# Patient Record
Sex: Female | Born: 1955 | Race: White | Hispanic: No | Marital: Married | Smoking: Never smoker
Health system: Southern US, Community
[De-identification: ages and names within clinical notes are randomized; demographics above are authoritative.]

## PROBLEM LIST (undated history)

## (undated) DIAGNOSIS — R4701 Aphasia: Secondary | ICD-10-CM

## (undated) DIAGNOSIS — F329 Major depressive disorder, single episode, unspecified: Secondary | ICD-10-CM

## (undated) DIAGNOSIS — C801 Malignant (primary) neoplasm, unspecified: Secondary | ICD-10-CM

## (undated) DIAGNOSIS — B029 Zoster without complications: Secondary | ICD-10-CM

## (undated) DIAGNOSIS — E079 Disorder of thyroid, unspecified: Secondary | ICD-10-CM

## (undated) DIAGNOSIS — R55 Syncope and collapse: Secondary | ICD-10-CM

## (undated) DIAGNOSIS — IMO0002 Reserved for concepts with insufficient information to code with codable children: Secondary | ICD-10-CM

## (undated) DIAGNOSIS — E860 Dehydration: Secondary | ICD-10-CM

## (undated) DIAGNOSIS — F419 Anxiety disorder, unspecified: Secondary | ICD-10-CM

## (undated) DIAGNOSIS — F32A Depression, unspecified: Secondary | ICD-10-CM

## (undated) DIAGNOSIS — Q231 Congenital insufficiency of aortic valve: Secondary | ICD-10-CM

## (undated) DIAGNOSIS — Z9221 Personal history of antineoplastic chemotherapy: Secondary | ICD-10-CM

## (undated) DIAGNOSIS — K219 Gastro-esophageal reflux disease without esophagitis: Secondary | ICD-10-CM

## (undated) DIAGNOSIS — R200 Anesthesia of skin: Secondary | ICD-10-CM

## (undated) DIAGNOSIS — I719 Aortic aneurysm of unspecified site, without rupture: Secondary | ICD-10-CM

## (undated) DIAGNOSIS — I441 Atrioventricular block, second degree: Secondary | ICD-10-CM

## (undated) DIAGNOSIS — Z950 Presence of cardiac pacemaker: Secondary | ICD-10-CM

## (undated) DIAGNOSIS — I82402 Acute embolism and thrombosis of unspecified deep veins of left lower extremity: Secondary | ICD-10-CM

## (undated) DIAGNOSIS — Z923 Personal history of irradiation: Secondary | ICD-10-CM

## (undated) DIAGNOSIS — I959 Hypotension, unspecified: Secondary | ICD-10-CM

## (undated) DIAGNOSIS — Q2381 Bicuspid aortic valve: Secondary | ICD-10-CM

## (undated) HISTORY — DX: Aphasia: R47.01

## (undated) HISTORY — DX: Syncope and collapse: R55

## (undated) HISTORY — DX: Anesthesia of skin: R20.0

## (undated) HISTORY — DX: Major depressive disorder, single episode, unspecified: F32.9

## (undated) HISTORY — PX: INSERT / REPLACE / REMOVE PACEMAKER: SUR710

## (undated) HISTORY — DX: Dehydration: E86.0

## (undated) HISTORY — PX: ABDOMINAL WALL MESH  REMOVAL: SHX1116

## (undated) HISTORY — DX: Disorder of thyroid, unspecified: E07.9

## (undated) HISTORY — PX: TONSILLECTOMY: SHX5217

## (undated) HISTORY — DX: Hypotension, unspecified: I95.9

## (undated) HISTORY — DX: Malignant (primary) neoplasm, unspecified: C80.1

## (undated) HISTORY — DX: Atrioventricular block, second degree: I44.1

## (undated) HISTORY — DX: Anxiety disorder, unspecified: F41.9

## (undated) HISTORY — DX: Depression, unspecified: F32.A

## (undated) HISTORY — PX: EYE SURGERY: SHX253

## (undated) HISTORY — PX: TONSILLECTOMY: SUR1361

## (undated) HISTORY — PX: DILATION AND CURETTAGE OF UTERUS: SHX78

## (undated) HISTORY — DX: Zoster without complications: B02.9

## (undated) HISTORY — PX: OTHER SURGICAL HISTORY: SHX169

## (undated) HISTORY — DX: Aortic aneurysm of unspecified site, without rupture: I71.9

---
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
## 1998-07-12 HISTORY — PX: CHOLECYSTECTOMY: SHX55

## 2002-07-12 HISTORY — PX: OTHER SURGICAL HISTORY: SHX169

## 2004-07-12 HISTORY — PX: PACEMAKER INSERTION: SHX728

## 2005-02-18 ENCOUNTER — Encounter: Payer: Self-pay | Admitting: Family Medicine

## 2005-03-12 ENCOUNTER — Encounter: Payer: Self-pay | Admitting: Family Medicine

## 2005-04-21 ENCOUNTER — Encounter: Payer: Self-pay | Admitting: Cardiovascular Disease

## 2005-05-21 ENCOUNTER — Ambulatory Visit: Payer: Self-pay

## 2005-05-29 ENCOUNTER — Emergency Department (HOSPITAL_COMMUNITY): Admission: EM | Admit: 2005-05-29 | Discharge: 2005-05-30 | Payer: Self-pay | Admitting: Emergency Medicine

## 2005-06-08 ENCOUNTER — Inpatient Hospital Stay (HOSPITAL_COMMUNITY): Admission: RE | Admit: 2005-06-08 | Discharge: 2005-06-09 | Payer: Self-pay | Admitting: *Deleted

## 2006-06-16 ENCOUNTER — Ambulatory Visit: Payer: Self-pay | Admitting: Family Medicine

## 2006-07-14 ENCOUNTER — Ambulatory Visit: Payer: Self-pay | Admitting: Gastroenterology

## 2007-02-17 ENCOUNTER — Ambulatory Visit: Payer: Self-pay | Admitting: Unknown Physician Specialty

## 2007-02-17 ENCOUNTER — Other Ambulatory Visit: Payer: Self-pay

## 2007-02-23 ENCOUNTER — Ambulatory Visit: Payer: Self-pay | Admitting: Unknown Physician Specialty

## 2007-09-18 ENCOUNTER — Ambulatory Visit: Payer: Self-pay | Admitting: Family Medicine

## 2008-10-26 ENCOUNTER — Emergency Department: Payer: Self-pay | Admitting: Emergency Medicine

## 2008-10-30 ENCOUNTER — Encounter: Payer: Self-pay | Admitting: Cardiovascular Disease

## 2008-11-25 ENCOUNTER — Ambulatory Visit: Payer: Self-pay | Admitting: Nephrology

## 2009-04-07 ENCOUNTER — Encounter: Payer: Self-pay | Admitting: Cardiovascular Disease

## 2009-08-20 DIAGNOSIS — R0602 Shortness of breath: Secondary | ICD-10-CM | POA: Insufficient documentation

## 2009-08-20 DIAGNOSIS — Z950 Presence of cardiac pacemaker: Secondary | ICD-10-CM | POA: Insufficient documentation

## 2009-08-20 DIAGNOSIS — R55 Syncope and collapse: Secondary | ICD-10-CM | POA: Insufficient documentation

## 2009-08-28 ENCOUNTER — Encounter: Payer: Self-pay | Admitting: Cardiovascular Disease

## 2009-09-03 ENCOUNTER — Encounter: Payer: Self-pay | Admitting: Cardiovascular Disease

## 2009-09-15 ENCOUNTER — Ambulatory Visit: Payer: Self-pay | Admitting: Internal Medicine

## 2009-09-15 DIAGNOSIS — R9431 Abnormal electrocardiogram [ECG] [EKG]: Secondary | ICD-10-CM | POA: Insufficient documentation

## 2009-11-26 ENCOUNTER — Ambulatory Visit: Payer: Self-pay | Admitting: Family Medicine

## 2009-12-17 ENCOUNTER — Encounter: Payer: Self-pay | Admitting: Internal Medicine

## 2009-12-23 ENCOUNTER — Ambulatory Visit: Payer: Self-pay | Admitting: Internal Medicine

## 2009-12-23 ENCOUNTER — Telehealth (INDEPENDENT_AMBULATORY_CARE_PROVIDER_SITE_OTHER): Payer: Self-pay | Admitting: *Deleted

## 2010-01-20 ENCOUNTER — Encounter: Payer: Self-pay | Admitting: Internal Medicine

## 2010-02-24 ENCOUNTER — Encounter: Payer: Self-pay | Admitting: Internal Medicine

## 2010-02-24 ENCOUNTER — Ambulatory Visit: Payer: Self-pay

## 2010-03-04 ENCOUNTER — Encounter: Payer: Self-pay | Admitting: Cardiovascular Disease

## 2010-03-05 ENCOUNTER — Ambulatory Visit: Payer: Self-pay | Admitting: Cardiovascular Disease

## 2010-03-06 LAB — CONVERTED CEMR LAB
BUN: 19 mg/dL (ref 6–23)
CO2: 25 meq/L (ref 19–32)
Calcium: 9.3 mg/dL (ref 8.4–10.5)
Chloride: 107 meq/L (ref 96–112)
Creatinine, Ser: 0.77 mg/dL (ref 0.40–1.20)
Glucose, Bld: 90 mg/dL (ref 70–99)
Potassium: 4.1 meq/L (ref 3.5–5.3)
Sodium: 144 meq/L (ref 135–145)

## 2010-03-11 ENCOUNTER — Encounter: Payer: Self-pay | Admitting: Cardiovascular Disease

## 2010-03-11 ENCOUNTER — Ambulatory Visit: Payer: Self-pay | Admitting: Cardiovascular Disease

## 2010-03-18 ENCOUNTER — Telehealth: Payer: Self-pay | Admitting: Cardiovascular Disease

## 2010-03-27 ENCOUNTER — Ambulatory Visit: Payer: Self-pay | Admitting: Internal Medicine

## 2010-03-27 ENCOUNTER — Encounter: Payer: Self-pay | Admitting: Internal Medicine

## 2010-05-08 ENCOUNTER — Encounter (INDEPENDENT_AMBULATORY_CARE_PROVIDER_SITE_OTHER): Payer: Self-pay | Admitting: *Deleted

## 2010-06-25 ENCOUNTER — Ambulatory Visit: Payer: Self-pay | Admitting: Internal Medicine

## 2010-07-17 ENCOUNTER — Telehealth (INDEPENDENT_AMBULATORY_CARE_PROVIDER_SITE_OTHER): Payer: Self-pay | Admitting: *Deleted

## 2010-07-22 ENCOUNTER — Encounter (INDEPENDENT_AMBULATORY_CARE_PROVIDER_SITE_OTHER): Payer: Self-pay | Admitting: *Deleted

## 2010-08-11 NOTE — Miscellaneous (Signed)
Summary: dx correction  Clinical Lists Changes  Problems: Changed problem from PACEMAKER DDD MDT (ICD-V45.Marland Kitchen01) to PACEMAKER, PERMANENT (ICD-V45.01)  changed the incorrect dx code to correct dx code

## 2010-08-11 NOTE — Miscellaneous (Signed)
Summary: Device preload  Clinical Lists Changes  Observations: Added new observation of PPM INDICATN: Syncope (09/15/2009 8:17) Added new observation of MAGNET RTE: BOL 85 ERI  65 (09/15/2009 8:17) Added new observation of PPMLEADSTAT2: active (09/15/2009 8:17) Added new observation of PPMLEADSER2: [id number] (09/15/2009 8:17) Added new observation of PPMLEADMOD2: 4092  (09/15/2009 8:17) Added new observation of PPMLEADDOI2: 06/08/2005  (09/15/2009 8:17) Added new observation of PPMLEADLOC2: RV  (09/15/2009 8:17) Added new observation of PPMLEADSTAT1: active  (09/15/2009 8:17) Added new observation of PPMLEADSER1: [id number]  (09/15/2009 8:17) Added new observation of PPMLEADMOD1: 4592  (09/15/2009 8:17) Added new observation of PPMLEADDOI1: 06/08/2005  (09/15/2009 8:17) Added new observation of PPMLEADLOC1: RA  (09/15/2009 8:17) Added new observation of PPM IMP MD: NOT IMPLANTED BY [place]  (09/15/2009 8:17) Added new observation of PPM DOI: 06/08/2005  (09/15/2009 8:17) Added new observation of PPM SERL#: [id number]  (09/15/2009 8:17) Added new observation of PPM MODL#: ADDRO1  (09/15/2009 0:86) Added new observation of PACEMAKERMFG: Medtronic  (09/15/2009 8:17) Added new observation of PACEMAKER MD: Sherryl Manges, MD  (09/15/2009 8:17)      PPM Specifications Following MD:  Sherryl Manges, MD     PPM Vendor:  Medtronic     PPM Model Number:  ADDRO1     PPM Serial Number:  [id number] PPM DOI:  06/08/2005     PPM Implanting MD:  NOT IMPLANTED BY [place]  Lead 1    Location: RA     DOI: 06/08/2005     Model #: [id number]     Serial #: [id number]     Status: active Lead 2    Location: RV     DOI: 06/08/2005     Model #: [id number]     Serial #: [id number]     Status: active  Magnet Response Rate:  BOL 85 ERI  65  Indications:  Syncope

## 2010-08-11 NOTE — Letter (Signed)
Summary: Medical Record Release  Medical Record Release   Imported By: Harlon Flor 08/28/2009 15:30:15  _____________________________________________________________________  External Attachment:    Type:   Image     Comment:   External Document

## 2010-08-11 NOTE — Letter (Signed)
Summary: Remote Device Check  [facility], Main Office  [address]   [place], [place] [address]   Phone: [phone]  Fax: [phone]     January 20, 2010 MRN: [id number]   Wendy Kelly [address] [place], [place]  [address]   Dear Ms. Terrien,   Your remote transmission was recieved and reviewed by your physician.  All diagnostics were within normal limits for you.   __X____Your next office visit is scheduled for:   September 2011 with Dr Graciela Husbands. Please call our office to schedule an appointment.    Sincerely,  Vella Kohler

## 2010-08-11 NOTE — Cardiovascular Report (Signed)
Summary: Office Visit   Office Visit   Imported By: Roderic Ovens 04/06/2010 16:22:34  _____________________________________________________________________  External Attachment:    Type:   Image     Comment:   External Document

## 2010-08-11 NOTE — Progress Notes (Signed)
Summary: PACEMAKER  Phone Note Outgoing Call Call back at [phone]   Caller: Patient Call For: [facility] Summary of Call: PT TRANSMITTED PACEMAKER AND WANTS TO MAKE SURE THAT IT WENT THROUGH-WOULD LIKE A CALL BACK Initial call taken by: Harlon Flor,  December 23, 2009 9:48 AM Summary of Call: LMOVM for pt---transmission received.  Waiting for dr to look at.  Will send letter to pt for next fup. Vella Kohler  December 23, 2009 2:06 PM

## 2010-08-11 NOTE — Assessment & Plan Note (Signed)
Summary: NEP/AMD   Visit Type:  New EP Referring Provider:  Mariah Milling Primary Provider:  Vonita Moss  CC:  no complaints. .  History of Present Illness: Mrs. Ryerson is seen at the request of Dr. Mariah Milling to establish followup for pacemaker implanted for syncope with second degree heart block. She received a Medtronic adaptive device in 2006  Her history is interesting in that she had a lifelong history of recurrent syncope. She was told repeatedly that she had low blood pressure. Her pauses that were detected by monitoring occurred at night. Notwithstanding  this following the pacemaker insertion, she has had no recurrent syncope.  She has a history of normal left ventricular function by recent echo. There is also aortic root enlargement.  Her family history is noteworthy for her sister dying suddenly at the age of 32. She too had an antecedent history of recurrent syncope. An autopsy was not performed.   she denies symptoms of chest pain shortness of breath. There has been no palpitations.    Preventive Screening-Counseling & Management  Alcohol-Tobacco     Alcohol drinks/day: 0     Smoking Status: never  Caffeine-Diet-Exercise     Caffeine use/day: 1 cup     Does Patient Exercise: yes      Drug Use:  no.    Current Problems (verified): 1)  Dyspnea  (ICD-786.05) 2)  Syncope and Collapse  (ICD-780.2) 3)  Aaa  (ICD-441.4) 4)  Pacemaker  (ICD-V45.Marland Kitchen01)  Current Medications (verified): 1)  Estrace 0.1 Mg/gm Crea (Estradiol) .... As Directed 2)  Aspirin 81 Mg Tbec (Aspirin) .... Take One Tablet By Mouth Daily 3)  Daily Multi  Tabs (Multiple Vitamins-Minerals) .Marland Kitchen.. 1 By Mouth Once Daily 4)  Acai Berry 500 Mg Caps (Acai) .Marland Kitchen.. 1 By Mouth Once Daily 5)  Flonase 50 Mcg/act Susp (Fluticasone Propionate) .... As Directed  Allergies (verified): 1)  ! Pcn 2)  ! Sulfa 3)  ! * Omnicef 4)  ! * Aleve 5)  ! Jone Baseman  Past History:  Family History: Last updated:  09-27-09 Father: deceased 28: suicide: stroke @ 58 Mother: living 54 Sister: deceased 70: cardiac arrest maternal aunt status post aortic valve replacement  Social History: Last updated: 09/27/2009 Full Time Married  Tobacco Use - No.  Alcohol Use - no Regular Exercise - yes Drug Use - no  Past Medical History: second degree AV block hx of syncope ascending aortic aneurysm estimated 4.4cm. depression Fatigue  Past Surgical History: ppm-medronic 2006 cataract 2004 tonsilectomy bladder tac gall bladder 2000  Family History: Father: deceased 18: suicide: stroke @ 59 Mother: living 74 Sister: deceased 56: cardiac arrest maternal aunt status post aortic valve replacement  Social History: Full Time Married  Tobacco Use - No.  Alcohol Use - no Regular Exercise - yes Drug Use - no Alcohol drinks/day:  0 Smoking Status:  never Caffeine use/day:  1 cup Does Patient Exercise:  yes Drug Use:  no  Review of Systems       full review of systems was negative apart from a history of present illness and past medical history.   Vital Signs:  Patient profile:   55 year old female Height:      64 inches Weight:      169.50 pounds BMI:     29.20 Pulse rate:   70 / minute Pulse rhythm:   regular BP sitting:   102 / 68  (left arm) Cuff size:   regular  Vitals Entered By: Cala Bradford  Lutterloh (September 15, 2009 10:45 AM)  Physical Exam  General:  Alert and oriented middle-aged Caucasian female appearing her stated age in no acute distress. HEENT  normal . Neck veins were flat; carotids brisk and full without bruits. No lymphadenopathy. Back without kyphosis. Lungs clear. Heart sounds regular without murmurs or gallops. PMI nondisplaced. Abdomen soft with active bowel sounds without midline pulsation or hepatomegaly. Femoral pulses and distal pulses intact. Extremities were without clubbing cyanosis or edemaSkin warm and dry. Neurological exam grossly normal affect is  normal   EKG  Procedure date:  09/15/2009  Findings:      sinus rhythm at 70 Intervals 0.18/0.09/0.40 Axis is -42 ST Segment depression noted diffusely  PPM Specifications Following MD:  Sherryl Manges, MD     PPM Vendor:  Medtronic     PPM Model Number:  ADDRO1     PPM Serial Number:  [id number] PPM DOI:  06/08/2005     PPM Implanting MD:  NOT IMPLANTED BY [place]  Lead 1    Location: RA     DOI: 06/08/2005     Model #: 4592     Serial #: [id number]     Status: active Lead 2    Location: RV     DOI: 06/08/2005     Model #: 5784     Serial #: [id number]     Status: active  Magnet Response Rate:  BOL 85 ERI  65  Indications:  Syncope   PPM Follow Up Remote Check?  No Battery Voltage:  2.77 V     Battery Est. Longevity:  5.5 years     Pacer Dependent:  No       PPM Device Measurements Atrium  Amplitude: 4.0 mV, Impedance: 449 ohms, Threshold: 0.25 V at 0.4 msec Right Ventricle  Amplitude: 15.68 mV, Impedance: 637 ohms, Threshold: 0.5 V at 0.4 msec  Episodes MS Episodes:  94     Percent Mode Switch:  <0.1%     Ventricular High Rate:  4     Atrial Pacing:  3.8%     Ventricular Pacing:  0.9%  Parameters Mode:  DDD     Lower Rate Limit:  60     Upper Rate Limit:  130 Paced AV Delay:  170     Sensed AV Delay:  150 Next Remote Date:  12/17/2009     Next Cardiology Appt Due:  09/10/2010 Tech Comments:  No parameter changes.  94 mode switch episodes the longest 18:38minutes.  The longest of 4 VHR episodes lasted 9 seconds.  She is not on coumadin. 0.7% of her total heart rates were > 130bpm.  We enrolled her in carelink today and she will send her transmissions every 3 months and return to the [place] office in one year to see Dr. Graciela Husbands. Altha Harm, LPN  September 16, 1322 10:50 AM   Impression & Recommendations:  Problem # 1:  ABNORMAL ELECTROCARDIOGRAM (ICD-794.31) reviewing her like a cardiogram these ST segment changes are new. However, within the year she has had a nonischemic  Myoview study. In the absence of further symptoms I would not pursue this.  Her updated medication list for this problem includes:    Aspirin 81 Mg Tbec (Aspirin) .Marland Kitchen... Take one tablet by mouth daily  Problem # 2:  PACEMAKER DDD MDT (ICD-V45.Marland Kitchen01) Device parameters and data were reviewed and no changes were made  Problem # 3:  SYNCOPE AND COLLAPSE (ICD-780.2) No recurrent syncope. I am frankly flabbergasted,  as I would've been interpreted her symptoms and her low blood pressure as underlying dysautonomia and would not have suspected that it would've improved with pacing. Having said this however placebo effects from pacer trials and vasovagal syncope are quite clear. I did not explore this further with the patient.   Her updated medication list for this problem includes:    Aspirin 81 Mg Tbec (Aspirin) .Marland Kitchen... Take one tablet by mouth daily  Problem # 4:  AORTIC ANEURYSM, ASCENDING (ICD-441.9) the aortic descending aneurysm is of some concern to me given the maternal family history of valve replacement and the sudden death of her sister. I wonder whether there is role in genetic evaluation. There is no stigmata of Marfan's syndrome. I will review with Dr. Mariah Milling.   Other Orders: Echocardiogram (Echo)  Patient Instructions: 1)  Your physician recommends that you schedule a follow-up appointment in: [place] in Sept.-- ON SAME DAY AS PACER, Graciela Husbands in 18 months 2)  Your physician has requested that you have an echocardiogram.  Echocardiography is a painless test that uses sound waves to create images of your heart. It provides your doctor with information about the size and shape of your heart and how well your heart's chambers and valves are working.  This procedure takes approximately one hour. There are no restrictions for this procedure. in August prior to appt. with Mariah Milling

## 2010-08-11 NOTE — Cardiovascular Report (Signed)
Summary: Office Visit Remote   Office Visit Remote   Imported By: Roderic Ovens 01/21/2010 11:57:44  _____________________________________________________________________  External Attachment:    Type:   Image     Comment:   External Document

## 2010-08-11 NOTE — Miscellaneous (Signed)
Summary: Orders Update  Clinical Lists Changes  Orders: Added new Referral order of CT Scan  (CT Scan) - Signed 

## 2010-08-11 NOTE — Progress Notes (Signed)
Summary: PHI  PHI   Imported By: Harlon Flor 09/15/2009 16:03:24  _____________________________________________________________________  External Attachment:    Type:   Image     Comment:   External Document

## 2010-08-11 NOTE — Procedures (Signed)
Summary: 1:30 APPT/AMD   Current Medications (verified): 1)  Estrace 0.1 Mg/gm Crea (Estradiol) .... As Directed 2)  Aspirin 81 Mg Tbec (Aspirin) .... Take One Tablet By Mouth Daily 3)  Daily Multi  Tabs (Multiple Vitamins-Minerals) .Marland Kitchen.. 1 By Mouth Once Daily 4)  Flonase 50 Mcg/act Susp (Fluticasone Propionate) .... As Directed 5)  Claritin-D 24 Hour 10-240 Mg Xr24h-Tab (Loratadine-Pseudoephedrine) .... One By Mouth Daily  Allergies (verified): 1)  ! Pcn 2)  ! Sulfa 3)  ! * Omnicef 4)  ! * Aleve 5)  ! Jone Baseman   PPM Specifications Following MD:  Sherryl Manges, MD     PPM Vendor:  Medtronic     PPM Model Number:  ADDRO1     PPM Serial Number:  [id number] PPM DOI:  06/08/2005     PPM Implanting MD:  NOT IMPLANTED BY [place]  Lead 1    Location: RA     DOI: 06/08/2005     Model #: 4592     Serial #: [id number]     Status: active Lead 2    Location: RV     DOI: 06/08/2005     Model #: [id number]     Serial #: [id number]     Status: active  Magnet Response Rate:  BOL 85 ERI  65  Indications:  Syncope   PPM Follow Up Remote Check?  No Battery Voltage:  2.77 V     Battery Est. Longevity:  5 years     Pacer Dependent:  No       PPM Device Measurements Atrium  Amplitude: 4.0 mV, Impedance: 493 ohms, Threshold: 0.375 V at 0.4 msec Right Ventricle  Amplitude: 15.68 mV, Impedance: 631 ohms, Threshold: 0.75 V at 0.4 msec  Episodes MS Episodes:  156     Percent Mode Switch:  <0.1%     Ventricular High Rate:  10     Atrial Pacing:  3.1%     Ventricular Pacing:  0.6%  Parameters Mode:  DDD     Lower Rate Limit:  60     Upper Rate Limit:  130 Paced AV Delay:  170     Sensed AV Delay:  150 Next Remote Date:  06/25/2010     Next Cardiology Appt Due:  03/13/2011 Tech Comments:  No parameter changes.  Mode switch episodes <0.1%, - coumadin.  10VHR episodes the longest 6 seconds.  Ms. Lackie's resting heart rate today was 93 bpm.  Carelink transmissions every 3 months. ROV 1 year with Dr. Graciela Husbands in  [place]. Altha Harm, LPN  March 27, 2010 1:58 PM

## 2010-08-11 NOTE — Progress Notes (Signed)
Summary: CT SCAN  Phone Note Call from Patient Call back at Home Phone [phone]   Caller: SELF Call For: [facility] Summary of Call: PT WOULD LIKE TO TALK TO SOMEONE ABOUT HER CT SCAN WITH THE DYE Initial call taken by: Harlon Flor,  March 18, 2010 3:54 PM  Follow-up for Phone Call        PT IS UPSET AND DOES NOT UNDERSTAND WHY SHE HAS NOT BEEN CALLED BACK YET BY DR Jason Fila EXPLAINED THAT HE WAS OFF LAST WEEK AND THAT WE WERE CLOSED ON MONDAY-I STATED THAT DR Mariah Milling HAS ONLY BEEN BACK IN CLINIC FOR 2 DAYS AND HAS BEEN SWAMPED WITH WORK-SHE STATED THAT SHE FEELS LIKE SHE HAS BEEN NEGLECTED AND IS GETTING SCARED-I EXPLAINED THAT SHE HAD NOT BEEN NEGLECTED, THAT OUR STAFF HAS NOT HAD THE OPPORTUNITY TO RETURN HER CALL BC WE ONLY HAVE ONE NURSE TODAY Follow-up by: Harlon Flor,  March 18, 2010 4:53 PM  Additional Follow-up for Phone Call Additional follow up Details #1::        Called spoke with pt, advised Dr Gala Romney has reviewed CT and states it appears to be stable and differed further recommendations and f/u to Dr Mariah Milling.  Pt is upset was told to keep BP down, but anxiety reguarding results is not helping.  Apologized to pt for the delay in results and advised her I would have Dr Mariah Milling review and call her back with recommendations. Additional Follow-up by: Cloyde Reams RN,  March 18, 2010 4:59 PM

## 2010-08-13 NOTE — Cardiovascular Report (Signed)
Summary: Office Visit Remote   Office Visit Remote   Imported By: Roderic Ovens 07/24/2010 11:10:35  _____________________________________________________________________  External Attachment:    Type:   Image     Comment:   External Document

## 2010-08-13 NOTE — Progress Notes (Signed)
  Phone Note Outgoing Call Call back at [facility] Phone [phone]   Call placed by: Altha Harm, LPN,  July 17, 2010 9:53 AM Summary of Call: Left message for patient that her 12/15 transmission was recieved and within normal limits.  She will get written confirmation via mail and because of the holidays and vacations unfortunately we are a little behind.  Her next transmissions is scheduled for 09/24/10.

## 2010-08-13 NOTE — Letter (Signed)
Summary: Remote Device Check  [facility], Main Office  [address]   [place], [place] [address]   Phone: [phone]  Fax: [phone]     July 22, 2010 MRN: [id number]   BRION HEDGES [address] [place], [place]  [address]   Dear Ms. Nath,   Your remote transmission was recieved and reviewed by your physician.  All diagnostics were within normal limits for you.  __X___Your next transmission is scheduled for:   09-24-2010.  Please transmit at any time this day.  If you have a wireless device your transmission will be sent automatically.   Sincerely,  Vella Kohler

## 2010-09-24 ENCOUNTER — Encounter (INDEPENDENT_AMBULATORY_CARE_PROVIDER_SITE_OTHER): Payer: BC Managed Care – PPO

## 2010-09-24 ENCOUNTER — Encounter: Payer: Self-pay | Admitting: Internal Medicine

## 2010-09-24 DIAGNOSIS — I495 Sick sinus syndrome: Secondary | ICD-10-CM

## 2010-10-27 ENCOUNTER — Telehealth: Payer: Self-pay | Admitting: Internal Medicine

## 2010-10-27 NOTE — Telephone Encounter (Signed)
Remote from March normal. Next transmission 6/14.  Patient aware

## 2010-11-27 NOTE — Op Note (Signed)
NAMEMINH, JASPER NO.:  [id number]   MEDICAL RECORD NO.:  [id number]          PATIENT TYPE:  INP   LOCATION:  2003                         FACILITY:  [facility]   PHYSICIAN:  Darlin Priestly, MD  DATE OF BIRTH:  1956/02/06   DATE OF PROCEDURE:  06/08/2005  DATE OF DISCHARGE:                                 OPERATIVE REPORT   PROCEDURE:  Insertion of a Medtronic Adapta generator, model number ADDR01,  serial number [id number], with passive atrial and ventricular leads.   ATTENDING PHYSICIAN:  Darlin Priestly, M.D.   COMPLICATIONS:  None.   INDICATIONS FOR PROCEDURE:  Ms. Newlun is a 55 year old female patient of  Dr. Thersa Salt in [place] with a history of recurring syncope with 24 hour  Holter monitor revealing episodes of second degree heart block type 2 as  well as episodes of junctional bradycardia, heart rates in the 30s during a  syncopal episode.  She was also noted to have mild aortic root dilatation  with mild to moderate AI.  She is now referred for dual chamber pacer  implant secondary to second degree block type 2.   DESCRIPTION OF PROCEDURE:  After giving informed written consent, the  patient was brought to the cardiac cath lab where the left chest were  prepped and draped in a sterile fashion.  ECG monitoring was established.  1% lidocaine was used to anesthetize the left mid-infraclavicular area.  Next, an approximately 3 cm horizontal mid-infraclavicular incision was  carried out and hemostasis was obtained with electrocautery.  Blunt  dissection was used to carry this down to the left pectoral fascia and,  again, hemostasis was obtained with electrocautery.  A 3 by 4 cm pocket was  created over the left pectoral fascia.  The left subclavian vein was then  easily entered and a guide-wire was easily advanced into the left  subclavian, SVC, and right atrium.  Following this, a 9 French dilator and  sheath were then easily passed over the  guide-wire.  The guide-wire and  dilator were then removed.  Next, a 52 cm passive Medtronic lead, model  number [id number], serial number [id number], was then advanced into the right  atrium.  The guide-wire was retained, the peel away sheath was removed.  A  second 9 French dilator and sheath were then passed over the retained guide-  wire, the guide-wire and dilator were removed.  A second 45 cm passive  Medtronic lead, model 4592, serial number [id number], was then passed into  the right atrium, the guide-wire was retained, the peel away sheath was  removed.  The guide-wire was then fastened to the sheet with a hemostat.  A  J-curve was then placed in the ventricular lead stylet and the ventricular  lead was allowed to prolapse in the tricuspid valve and positioned in the RV  apex without difficulty.  Ventricular lead impedance was 894 ohms, R wave  measured at 14 millivolts, threshold 0.3 volts at 0.5 milliseconds, current  0.4 milliamps, 10 volts negative for diaphragmatic stimulation.  The J-curve  was then allowed to form in  the atrial lead and the atrial lead was  positioned in the right atrial appendage.  The P wave measured 4.9  millivolts, threshold in the atrium was 0.5 volts at 0.5 milliseconds,  atrial impedance 493 ohms, current 1.4 milliamps.  Again, 10 volts was  negative.  Four silk sutures were then secured to the left pectoral fascia  and two silk sutures per lead were used to anchor the lead in place.  1%  Kanamycin was then used to irrigate the pocket.  Again, hemostasis was  obtained with electrocautery.  The leads were then connected in serial  fashion to a Medtronic Adapta,model number ADDRO1, serial number [id number],  head screws were tightened, and pacing was confirmed.  A single silk suture  was then placed in the apex of the pocket.  The pacer and wires were then  delivered into the pocket and the header was secured to the silk suture.  The subcutaneous layer of the  pocket was then closed using running 2-0  Vicryl.  The skin layers were then closed using running 4-0 Vicryl.  Steri-  Strips were then applied.  The patient returned to the recovery room in  stable condition.   CONCLUSION:  Successful implantation of a Medtronic Adapta, model ADDR01,  serial number [id number] pacer with passive atrial and ventricular leads.      Darlin Priestly, MD  Electronically Signed     RHM/MEDQ  D:  06/08/2005  T:  06/08/2005  Job:  [id number]

## 2010-11-27 NOTE — Discharge Summary (Signed)
Wendy Kelly, HUNDAL NO.:  [id number]   MEDICAL RECORD NO.:  [id number]          PATIENT TYPE:  INP   LOCATION:  2003                         FACILITY:  [facility]   PHYSICIAN:  Darlin Priestly, MD  DATE OF BIRTH:  1956-01-08   DATE OF ADMISSION:  06/08/2005  DATE OF DISCHARGE:  06/09/2005                                 DISCHARGE SUMMARY   DISCHARGE DIAGNOSES:  1.  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  2.  A  4.2 x 4.4 cm ascending aortic aneurysm.   HOSPITAL COURSE:  The patient is a 55 year old female who was referred Dr.  Jenne Campus by Dr. Yetta Numbers with a history of syncope. She had a 24 Holter by  her primary doctor which revealed intermittent episodes of second-degree AV  block. She did have a echocardiogram by another cardiologist that showed  normal LV function with moderate AI and a mildly dilated ascending aorta.  A  CT scan showed 4.2 x 4.4 cm ascending aorta. She was actually seen by Dr.  Jenne Campus as a second opinion. After reviewing her records and her monitor, he  felt we should proceed with implantation of pacemaker. This was done  June 08, 2005, by Dr. Jenne Campus with a Medtronic device. She tolerated  this well. We feel she can be discharged June 09, 2005. She will follow  up with Dr. Mikey Bussing office in a week for a wound check and then see him in  about two or three months.   DISCHARGE MEDICATIONS:  1.  Aspirin 81 mg a day.  2.  Activella and progesterone as taken at home.   LABORATORY DATA:  Chest x-ray is pending at the time of this dictation.  She  will be discharged pending these results.   White count 5.3, hemoglobin 13.8, hematocrit 39.2, platelets 201. Sodium  141, potassium 4.2, BUN 10, creatinine 0.7 urinalysis unremarkable.   DISPOSITION:  The patient is discharged in stable condition and will follow-  up with Dr. Mikey Bussing office in a week  for wound check and then see him in  about three months.     Abelino Derrick, P.A.      Darlin Priestly, MD  Electronically Signed   LKK/MEDQ  D:  06/09/2005  T:  06/09/2005  Job:  [id number]   cc:   Tama Gander, M.D.

## 2010-12-08 ENCOUNTER — Ambulatory Visit: Payer: Self-pay | Admitting: Family Medicine

## 2010-12-24 ENCOUNTER — Encounter: Payer: Self-pay | Admitting: *Deleted

## 2010-12-28 ENCOUNTER — Encounter: Payer: Self-pay | Admitting: *Deleted

## 2011-01-01 ENCOUNTER — Ambulatory Visit (INDEPENDENT_AMBULATORY_CARE_PROVIDER_SITE_OTHER): Payer: BC Managed Care – PPO | Admitting: *Deleted

## 2011-01-01 ENCOUNTER — Other Ambulatory Visit: Payer: Self-pay

## 2011-01-01 DIAGNOSIS — I442 Atrioventricular block, complete: Secondary | ICD-10-CM

## 2011-01-01 DIAGNOSIS — Z950 Presence of cardiac pacemaker: Secondary | ICD-10-CM

## 2011-01-05 ENCOUNTER — Other Ambulatory Visit: Payer: Self-pay | Admitting: Cardiovascular Disease

## 2011-01-05 DIAGNOSIS — R0602 Shortness of breath: Secondary | ICD-10-CM

## 2011-01-05 DIAGNOSIS — I77810 Thoracic aortic ectasia: Secondary | ICD-10-CM

## 2011-01-07 ENCOUNTER — Ambulatory Visit: Payer: Self-pay | Admitting: Gastroenterology

## 2011-01-18 NOTE — Progress Notes (Signed)
Pacer remote check  

## 2011-01-20 ENCOUNTER — Encounter: Payer: Self-pay | Admitting: *Deleted

## 2011-02-02 ENCOUNTER — Encounter: Payer: Self-pay | Admitting: Internal Medicine

## 2011-02-11 ENCOUNTER — Other Ambulatory Visit: Payer: Self-pay | Admitting: Physician Assistant

## 2011-03-01 ENCOUNTER — Telehealth: Payer: Self-pay | Admitting: Cardiovascular Disease

## 2011-03-01 ENCOUNTER — Ambulatory Visit (INDEPENDENT_AMBULATORY_CARE_PROVIDER_SITE_OTHER): Payer: BC Managed Care – PPO | Admitting: *Deleted

## 2011-03-01 ENCOUNTER — Encounter: Payer: Self-pay | Admitting: *Deleted

## 2011-03-01 DIAGNOSIS — R42 Dizziness and giddiness: Secondary | ICD-10-CM

## 2011-03-01 NOTE — Telephone Encounter (Signed)
Pt c/o of dizziness & lightheadedness, nausea & diahherra. Comes and goes. Been going on for about 2 weeks seems worse today. I scheduled her for Wednesday but she thinks she needs to be seen sooner.

## 2011-03-01 NOTE — Telephone Encounter (Signed)
1045- Spoke to pt, she has had intermittent problems with nausea/diarrhea that have resolved recently, but still is having "light-headedness," usually after walking. Pt has taken BP/HR a few times at 121/94 HR 81 and 131/97 HR 81. She also c/o CP, about 2 episodes "sharp pain" and "burping follows," she feels could be GI. Pt recently on 10 days of Avelox for sinus infection, completed 1 week ago. No other med changes recently. Pt denies having virus/cold/fever now. Pt does have pacemaker, last download was 12/2010, per Gunnar Fusi she had some a fib, and fast ventricular beats. Advised pt to come in office for nurse visit, we will download device, possible ekg, and do orthostatic BP. Will discuss symptoms with Dr. Mariah Milling in office. Pt will keep appt for this wed with TG depending on results today. Pt ok with this.

## 2011-03-01 NOTE — Progress Notes (Signed)
Pt in for Carelink express download and Orthostatic BP. Unable to download with carelink in office, pt will do this at home tonight, I have notified Gunnar Fusi at pacer clinic to look out for results in AM. EKG done in office, Dr. Mariah Milling reviewed and said normal. Pt c/o "light-headedness" when she walks, this has been occuring for ~ 2 weeks, she feels "hard, fast beat in chest" and when she palpates her HR on wrist it is in 60s, while she is feeling pounding in chest. She also states she feels CP, sharp pains intermittently at random that resolve on their own. BMP received from [facility] with normal results, only abnormal serum sodium high by one point at 146. Pt still has appt Wed since we do not have any results from device at this time, and may change appt depending on results.

## 2011-03-02 ENCOUNTER — Telehealth: Payer: Self-pay | Admitting: *Deleted

## 2011-03-02 ENCOUNTER — Other Ambulatory Visit: Payer: Self-pay | Admitting: Cardiovascular Disease

## 2011-03-02 DIAGNOSIS — Z950 Presence of cardiac pacemaker: Secondary | ICD-10-CM

## 2011-03-02 DIAGNOSIS — R42 Dizziness and giddiness: Secondary | ICD-10-CM

## 2011-03-02 DIAGNOSIS — R002 Palpitations: Secondary | ICD-10-CM

## 2011-03-02 NOTE — Telephone Encounter (Signed)
Called pt with download information from her device. (see nurse visit 03/01/11) Notified her, per [facility] in pacer clinic, that she had 2 episodes in past 2 weeks, with sinus tachycardia up to 170bpm, one episode lasted 11 minutes. Spoke to Dr. Graciela Husbands here in office today regarding pt's symptoms from yesterday and result from her transmission, as well as her "light-headedness" while walking. He advised we do an event monitor, and have pt f/u with him in the next couple weeks for ov and treadmill. Treadmill to evaluate her BP while she is walking. Pt notified and ok with recc. Will cancel appt for tomorrow with Dr. Mariah Milling.

## 2011-03-02 NOTE — Telephone Encounter (Signed)
Dr. Mariah Milling, pt said she gets an echo every year, and I see her future order is for 12/2011. Her last echo was 02/2010 to evaluate dilated aortic root, she is asking to have sooner, can we do that. Please advise.

## 2011-03-03 ENCOUNTER — Ambulatory Visit: Payer: BC Managed Care – PPO | Admitting: Cardiovascular Disease

## 2011-03-03 DIAGNOSIS — R002 Palpitations: Secondary | ICD-10-CM

## 2011-03-03 DIAGNOSIS — R9431 Abnormal electrocardiogram [ECG] [EKG]: Secondary | ICD-10-CM

## 2011-03-03 NOTE — Telephone Encounter (Signed)
Wendy Kelly, can you please schedule echo, order is in. She has f/u w/ [facility] 04/01/11, so if it can be done earliest in sept possible to have results before this appt. Thanks.

## 2011-03-03 NOTE — Telephone Encounter (Signed)
Would schedule echo in Sept so I can look at study and compare to previous echo I will be gone next week

## 2011-03-22 ENCOUNTER — Telehealth: Payer: Self-pay

## 2011-03-22 NOTE — Telephone Encounter (Signed)
FYI:  The patient sent the monitor back.  She did not feel the need to wear it anymore.

## 2011-03-23 ENCOUNTER — Other Ambulatory Visit: Payer: Self-pay

## 2011-03-23 DIAGNOSIS — R0600 Dyspnea, unspecified: Secondary | ICD-10-CM

## 2011-03-23 DIAGNOSIS — I77810 Thoracic aortic ectasia: Secondary | ICD-10-CM

## 2011-03-30 ENCOUNTER — Other Ambulatory Visit (INDEPENDENT_AMBULATORY_CARE_PROVIDER_SITE_OTHER): Payer: BC Managed Care – PPO | Admitting: *Deleted

## 2011-03-30 ENCOUNTER — Encounter: Payer: Self-pay | Admitting: *Deleted

## 2011-03-30 DIAGNOSIS — R0600 Dyspnea, unspecified: Secondary | ICD-10-CM

## 2011-03-30 DIAGNOSIS — I77810 Thoracic aortic ectasia: Secondary | ICD-10-CM

## 2011-03-30 DIAGNOSIS — R0602 Shortness of breath: Secondary | ICD-10-CM

## 2011-03-30 DIAGNOSIS — I359 Nonrheumatic aortic valve disorder, unspecified: Secondary | ICD-10-CM

## 2011-03-31 ENCOUNTER — Encounter: Payer: Self-pay | Admitting: Internal Medicine

## 2011-03-31 ENCOUNTER — Telehealth: Payer: Self-pay

## 2011-03-31 NOTE — Telephone Encounter (Signed)
Error

## 2011-04-01 ENCOUNTER — Ambulatory Visit (INDEPENDENT_AMBULATORY_CARE_PROVIDER_SITE_OTHER): Payer: BC Managed Care – PPO | Admitting: Internal Medicine

## 2011-04-01 ENCOUNTER — Other Ambulatory Visit: Payer: Self-pay | Admitting: Internal Medicine

## 2011-04-01 ENCOUNTER — Ambulatory Visit: Payer: BC Managed Care – PPO | Admitting: Cardiovascular Disease

## 2011-04-01 DIAGNOSIS — R002 Palpitations: Secondary | ICD-10-CM | POA: Insufficient documentation

## 2011-04-01 DIAGNOSIS — R55 Syncope and collapse: Secondary | ICD-10-CM

## 2011-04-01 DIAGNOSIS — I495 Sick sinus syndrome: Secondary | ICD-10-CM

## 2011-04-01 LAB — PACEMAKER DEVICE OBSERVATION
AL AMPLITUDE: 4 mv
AL IMPEDENCE PM: 495 Ohm
AL THRESHOLD: 0.5 V
ATRIAL PACING PM: 8
BATTERY VOLTAGE: 2.76 V
RV LEAD AMPLITUDE: 11.2 mv
RV LEAD THRESHOLD: 0.75 V
VENTRICULAR PACING PM: 3

## 2011-04-01 NOTE — Assessment & Plan Note (Signed)
No recurrent syncope 

## 2011-04-01 NOTE — Patient Instructions (Signed)
Device clinic transmission 07/01/11 10:10

## 2011-04-01 NOTE — Assessment & Plan Note (Addendum)
These are getting better. I wonder whether there is a post viral adrenergic hypersensitivity. No clear postural or exercise tachycardia was noted today.  Review of her histograms demonstrates that there is a bimodal distribution with a peak in the 70-80 and 90-100. This may represent either a shift in the mean over the 12 months of data or indeed a bimodal distribution of heart rates. In support of the latter was a shift noted while doing her treadmill abruptly from 70-80-100 or so. No T wave change could be seen but we are not able to record the data.  I reviewed this extensively with the patient and will plan to keep an eye and compare this with our CareLink transmission in December

## 2011-04-01 NOTE — Progress Notes (Signed)
   HPI  Wendy Kelly is a 55 y.o. female Seeing because of problems over the last couple of months with the sensation that her heart beat is racing but without detectable tachycardia. Because of these symptoms, she was given an event recorder. Symptoms were associated with heart rates in the 70-110 range. P Wave morphologies seem to be consistent with sinus rhythm. These events occurred primarily however, without exertion i.e. Sitting around or just walking. She did not notice symptoms with exertion.  She has a history of a pacemaker implanted for syncope with second degree heart block. She received a Medtronic adaptive device in 2006  Her history is interesting in that she had a lifelong history of recurrent syncope. She was told repeatedly that she had low blood pressure. Her pauses that were detected by monitoring occurred at night. Notwithstanding this following the pacemaker insertion, she has had no recurrent syncope.  She has a history of normal left ventricular function by recent echo.   There is also aortic root enlargement.  Her family history is noteworthy for her sister dying suddenly at the age of 30. She too had an antecedent history of recurrent syncope. An autopsy was not performed.      Past Medical History  Diagnosis Date  . AV block, 2nd degree   . Syncope     hx  . 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 Depression   . Fatigue     Past Surgical History  Procedure Date  . Pacemaker insertion 2006    Medtronic  . Cataract 2004  . Tonsillectomy   . Bladder tack   . Cholecystectomy 2000    Current Outpatient Prescriptions  Medication Sig Dispense Refill  . estradiol (ESTRACE) 0.1 MG/GM vaginal cream Place 2 g vaginally daily. UAD       . loratadine-pseudoephedrine (CLARITIN-D 24-HOUR) 10-240 MG per 24 hr tablet Take 1 tablet by mouth daily.        Marland Kitchen aspirin 81 MG EC tablet Take 81 mg by mouth daily.        . fluticasone (FLONASE) 50 MCG/ACT nasal  spray Place 2 sprays into the nose daily. UAD       . Multiple Vitamin (MULTIVITAMIN) tablet Take 1 tablet by mouth daily.          Allergies  Allergen Reactions  . Cefdinir   . Naproxen Sodium   . Penicillins   . Sulfonamide Derivatives     Review of Systems negative except from HPI and PMH  Physical Exam Well developed and well nourished in no acute distress HENT normal E scleral and icterus clear Neck Supple JVP flat; carotids brisk and full Clear to ausculation Regular rate and rhythm, no murmurs gallops or rub Soft with active bowel sounds No clubbing cyanosis and edema Alert and oriented, grossly normal motor and sensory function Skin Warm and Dry  Treadmill testing was undertaken to look at heart rate excursion. She achieved a heart rate of 110 in the first minute of stage I but by stage III, her heart rate: 125 range.  Assessment and  Plan

## 2011-04-02 ENCOUNTER — Other Ambulatory Visit: Payer: Self-pay | Admitting: Internal Medicine

## 2011-04-02 DIAGNOSIS — R42 Dizziness and giddiness: Secondary | ICD-10-CM

## 2011-04-02 DIAGNOSIS — R002 Palpitations: Secondary | ICD-10-CM

## 2011-04-04 ENCOUNTER — Encounter: Payer: Self-pay | Admitting: Cardiovascular Disease

## 2011-05-07 ENCOUNTER — Observation Stay: Payer: Self-pay | Admitting: Internal Medicine

## 2011-05-20 ENCOUNTER — Telehealth: Payer: Self-pay | Admitting: *Deleted

## 2011-05-20 NOTE — Telephone Encounter (Signed)
Pt called c/o jaw pain, HA, dysphagia and constant light-headedness, also has "swishing in ear." She called EMS 10/25 was admitted to [facility], CT scan normal, was told no sign of stroke, possible TIA but cannot confirm. She was advised to see neuro, she has appt with Dr. Clelia Croft next tues. She saw ENT which r/o vertigo. She has appt with [facility] for carotid u/s tomorrow. Dentist has r/o TMJ and he and ER doc told her to talk to cardiologist for work-up. Pt's recent BP she says 138/90, but when she is walking and having symptoms, she has no way of checking BP. She had recent ETT here with SK to evaluate BP drops with walking and palpitations. Pt has PPM and h/o recurrent syncope. Pt had myoview 6 yrs ago with Dr. Jenne Campus at [place], but none since. I pulled up discharge summary from [facility] and they said f/u with PCP in 1-2 weeks. Please advise what to tell pt, do you want me to schedule f/u??

## 2011-05-20 NOTE — Telephone Encounter (Signed)
Would wait until after result of carotid If normal, she can schedule visit with me. Too much to go over in a note

## 2011-05-21 NOTE — Telephone Encounter (Signed)
Notified pt of below. Went ahead and scheduled her for 11/23 (next available), and she will cancel if needed. Otherwise she will ask that her results be faxed to our office for upcoming visit.

## 2011-05-24 ENCOUNTER — Ambulatory Visit: Payer: Self-pay | Admitting: Vascular Surgery

## 2011-06-04 ENCOUNTER — Ambulatory Visit: Payer: BC Managed Care – PPO | Admitting: Cardiovascular Disease

## 2011-06-11 ENCOUNTER — Encounter: Payer: Self-pay | Admitting: Cardiovascular Disease

## 2011-06-11 ENCOUNTER — Ambulatory Visit (INDEPENDENT_AMBULATORY_CARE_PROVIDER_SITE_OTHER): Payer: BC Managed Care – PPO | Admitting: Cardiovascular Disease

## 2011-06-11 DIAGNOSIS — R079 Chest pain, unspecified: Secondary | ICD-10-CM | POA: Insufficient documentation

## 2011-06-11 DIAGNOSIS — R0602 Shortness of breath: Secondary | ICD-10-CM

## 2011-06-11 DIAGNOSIS — Z950 Presence of cardiac pacemaker: Secondary | ICD-10-CM

## 2011-06-11 DIAGNOSIS — R4701 Aphasia: Secondary | ICD-10-CM

## 2011-06-11 DIAGNOSIS — I959 Hypotension, unspecified: Secondary | ICD-10-CM

## 2011-06-11 DIAGNOSIS — I951 Orthostatic hypotension: Secondary | ICD-10-CM | POA: Insufficient documentation

## 2011-06-11 NOTE — Assessment & Plan Note (Signed)
Chest pain episodes are atypical in nature, as she has good exercise tolerance and her symptoms come on at rest. No further workup indicated at this time. Uncertain if she has a GI issue and we have recommended omeprazole.

## 2011-06-11 NOTE — Assessment & Plan Note (Signed)
She does have episodes where she does not feel well with a strong heartbeat.  Uncertain if this is when she is pacing with her pacemaker.  She does have aDownload scheduled for 3 weeks from now. We will look for these results and I have asked her to talk with EP if there is a large increase in the period of pacing. Previously, she has been 4% A paced, 1.5% V paced.  Symptoms could be secondary to anxiety from recent possible TIA.

## 2011-06-11 NOTE — Assessment & Plan Note (Signed)
Brief episode in October, now resolved concerning for TIA.  She is currently on aspirin. If she has additional episodes, bubble study would be indicated. We would have to also consider more aggressive anticoagulation. MRI could not be completed secondary to pacemaker. CT Scan and carotid were unrevealing.

## 2011-06-11 NOTE — Patient Instructions (Signed)
You are doing well. No medication changes were made.  For continued chest pain, take omeprazole 20 mg one to two a day  Please call us if you have new issues that need to be addressed before your next appt.  The office will contact you for a follow up Appt. In 12 months  We will schedule you for an echocardiogram for ascending aorta aneursym. In sept 2013

## 2011-06-11 NOTE — Assessment & Plan Note (Signed)
I have suggested she increase her salt and fluid intake given her hypotension and periods of dizziness.

## 2011-06-11 NOTE — Progress Notes (Signed)
Patient ID: Wendy Kelly, female    DOB: [birth year], 55 y.o.   MRN: [id number]  HPI Comments: [name] is a very pleasant 55 year old woman, patient of Dr. 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 She presents to establish care.   The events of the right facial droop and expressive aphasia occurred in October. She had workup including carotid ultrasound that showed no significant disease.  Echocardiogram had been done in September 2012 prior to this episode and bubble study was not performed.  Currently she feels tired, occasional chest pain and nausea. She is able to exercise on a regular basis for 30 minutes on elliptical with no symptoms. Significant anxiety over her recent neurologic event. She was told it was a migraine by neurology. She is having episodes of strong heartbeats, occasional dizziness. Blood pressure has been running low.  EKG shows normal sinus rhythm with rate 75 beats per minute with no significant ST or T wave changes  Previous stress test April 2010 showed no significant ischemia. This was a treadmill study with good exercise tolerance Last echocardiogram September 2012 showing a descending aorta 4.7 cm with little change from prior echocardiogram. Total cholesterol 153, LDL 73, HDL 71  (04/2011)      Outpatient Encounter Prescriptions as of 06/11/2011  Medication Sig Dispense Refill  . aspirin 81 MG EC tablet Take 81 mg by mouth daily.        . Cholecalciferol (VITAMIN D3) 2000 UNITS TABS Take 2,000 Units by mouth daily.        Marland Kitchen estradiol (ESTRACE)  0.1 MG/GM vaginal cream Place 2 g vaginally daily. UAD       . NON FORMULARY Donolent vitamin        . vitamin C (ASCORBIC ACID) 500 MG tablet Take 500 mg by mouth daily.          Review of Systems  Constitutional: Negative.   HENT: Negative.   Eyes: Negative.   Respiratory: Negative.   Cardiovascular: Negative.   Gastrointestinal: Negative.   Musculoskeletal: Negative.   Skin: Negative.   Neurological: Negative.   Hematological: Negative.   Psychiatric/Behavioral: Negative.   All other systems reviewed and are negative.    BP 92/72  Pulse 75  Ht 5\' 4"  (1.626 m)  Wt 166 lb 4 oz (75.411 kg)  BMI 28.54 kg/m2  Physical Exam  Nursing note and vitals reviewed. Constitutional: She is oriented to person, place, and time. She appears well-developed and well-nourished.  HENT:  Head: Normocephalic.  Nose: Nose normal.  Mouth/Throat: Oropharynx is clear and moist.  Eyes: Conjunctivae are normal. Pupils are equal, round, and reactive to light.  Neck: Normal range of motion. Neck supple. No JVD present.  Cardiovascular: Normal rate, regular rhythm, S1 normal, S2 normal, normal heart sounds and intact distal pulses.  Exam reveals no gallop and no friction rub.   No murmur heard. Pulmonary/Chest: Effort normal and breath sounds normal. No respiratory distress. She has no wheezes. She has no rales. She exhibits no tenderness.  Abdominal: Soft. Bowel sounds are normal. She exhibits no distension. There is no tenderness.  Musculoskeletal: Normal range  of motion. She exhibits no edema and no tenderness.  Lymphadenopathy:    She has no cervical adenopathy.  Neurological: She is alert and oriented to person, place, and time. Coordination normal.  Skin: Skin is warm and dry. No rash noted. No erythema.  Psychiatric: She has a normal mood and affect. Her behavior is normal. Judgment and thought content normal.         Assessment and Plan

## 2011-07-01 ENCOUNTER — Ambulatory Visit (INDEPENDENT_AMBULATORY_CARE_PROVIDER_SITE_OTHER): Payer: BC Managed Care – PPO | Admitting: *Deleted

## 2011-07-01 ENCOUNTER — Other Ambulatory Visit: Payer: Self-pay | Admitting: Internal Medicine

## 2011-07-01 ENCOUNTER — Encounter: Payer: Self-pay | Admitting: Internal Medicine

## 2011-07-01 DIAGNOSIS — Z950 Presence of cardiac pacemaker: Secondary | ICD-10-CM

## 2011-07-01 DIAGNOSIS — I495 Sick sinus syndrome: Secondary | ICD-10-CM

## 2011-07-09 LAB — REMOTE PACEMAKER DEVICE
AL AMPLITUDE: 2.8 mv
AL IMPEDENCE PM: 517 Ohm
AL THRESHOLD: 0.375 V
ATRIAL PACING PM: 3
BAMS-0001: 140 {beats}/min
BATTERY VOLTAGE: 2.76 V
RV LEAD AMPLITUDE: 22.4 mv
RV LEAD IMPEDENCE PM: 593 Ohm
RV LEAD THRESHOLD: 1 V
VENTRICULAR PACING PM: 0

## 2011-07-09 NOTE — Progress Notes (Signed)
PPM remote 

## 2011-07-13 HISTORY — PX: VAGINAL HYSTERECTOMY: SUR661

## 2011-07-29 ENCOUNTER — Encounter: Payer: Self-pay | Admitting: *Deleted

## 2011-09-03 ENCOUNTER — Ambulatory Visit: Payer: Self-pay | Admitting: Obstetrics and Gynecology

## 2011-10-06 ENCOUNTER — Ambulatory Visit (INDEPENDENT_AMBULATORY_CARE_PROVIDER_SITE_OTHER): Payer: BC Managed Care – PPO | Admitting: Nurse Practitioner

## 2011-10-06 ENCOUNTER — Encounter: Payer: Self-pay | Admitting: Nurse Practitioner

## 2011-10-06 VITALS — BP 150/78 | HR 88 | Ht 64.0 in | Wt 167.0 lb

## 2011-10-06 DIAGNOSIS — I719 Aortic aneurysm of unspecified site, without rupture: Secondary | ICD-10-CM

## 2011-10-06 DIAGNOSIS — R0789 Other chest pain: Secondary | ICD-10-CM

## 2011-10-06 DIAGNOSIS — R072 Precordial pain: Secondary | ICD-10-CM

## 2011-10-06 DIAGNOSIS — Z8673 Personal history of transient ischemic attack (TIA), and cerebral infarction without residual deficits: Secondary | ICD-10-CM

## 2011-10-06 DIAGNOSIS — I441 Atrioventricular block, second degree: Secondary | ICD-10-CM

## 2011-10-06 DIAGNOSIS — R209 Unspecified disturbances of skin sensation: Secondary | ICD-10-CM

## 2011-10-06 DIAGNOSIS — R9431 Abnormal electrocardiogram [ECG] [EKG]: Secondary | ICD-10-CM

## 2011-10-06 DIAGNOSIS — R200 Anesthesia of skin: Secondary | ICD-10-CM | POA: Insufficient documentation

## 2011-10-06 DIAGNOSIS — R4701 Aphasia: Secondary | ICD-10-CM

## 2011-10-06 DIAGNOSIS — R079 Chest pain, unspecified: Secondary | ICD-10-CM

## 2011-10-06 DIAGNOSIS — R002 Palpitations: Secondary | ICD-10-CM

## 2011-10-06 NOTE — Patient Instructions (Addendum)
Follow up with Dr. Mariah Milling in 4-6 weeks.  Need to have an Echo with bubble study. Your physician has requested that you have an echocardiogram. Echocardiography is a painless test that uses sound waves to create images of your heart. It provides your doctor with information about the size and shape of your heart and how well your heart's chambers and valves are working. This procedure takes approximately one hour. There are no restrictions for this procedure.  Need to have a Myoview Your physician has requested that you have a lexiscan myoview. For further information please visit [email or web address] Please follow instruction sheet, as given.

## 2011-10-06 NOTE — Progress Notes (Signed)
Patient Name: Wendy Kelly Date of Encounter: 10/06/2011  Primary Care Provider:  Vonita Moss, MD, MD Primary Cardiologist:  Concha Se, MD  Patient Profile  56 year old female with history of heart block and pacemaker placement who presents secondary to chest pain and ongoing intermittent facial numbness and aphasia.  Problem List   Past Medical History  Diagnosis Date  . AV block, 2nd degree     a. 05/2005 - s/p MDT Adapta ADDR01 Dual Chamber PPM  . Syncope     a. 04/2011 Echo: EF 55-65%, No RWMA, Gr 1 DD.  . AA (aortic aneurysm)     a. 04/2011 Echo: Ao Root: 4.1cm, Asc Ao 4.7cm.  . Depression   . Fatigue   . Expressive aphasia     a. ongoing since 04/2011 - seen by neurology - ? TIA vs. Migraine  . Facial numbness     a. ongoing since 04/2011 - seen by neurology - ? TIA vs. Migraine  . Anxiety   . Chest pain     a. 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   Past Surgical History  Procedure Date  . Pacemaker insertion 2006    Medtronic  . Cataract 2004  . Tonsillectomy   . Bladder tack   . Cholecystectomy 2000    Allergies  Allergies  Allergen Reactions  . Cefdinir     unknown   . Naproxen Sodium     Itching/rash  . Penicillins     Rash/hives  . Sulfonamide Derivatives     Rash/itching    HPI  56 year old female with the above problem list.  In the fall of last year, patient had episodes of aphasia and has subsequently been seen by neurology both here in [place] and also in [place].  The symptoms have continued to occur intermittently including an unbalanced sensation while walking which usually lasts a minute; right-sided facial paresthesias; right temple and eye pain; occasional sensation of tongue swelling.  She's been told by various neurologists that symptoms may represent a TIA or a complex migraine.  She has had multiple CT scans showing no evidence of stroke and is not a candidate for MRI secondary to pacemaker.  The symptoms are very unnerving to her.   Yesterday she had a recurrent aphasic type episode which prompted her to make this appointment today.  She currently has no symptoms.    Patient also reports frequent episodes of rest and exertional left-sided chest discomfort lasting about 5 minutes and resolving spontaneously.  The symptoms are actually less likely to occur when she is exercising.  She is pending a vaginal hysterectomy and redo bladder tack and in combination with her neurologic symptoms chest pain, she is quite anxious.  Home Medications  Prior to Admission medications   Medication Sig Start Date End Date Taking? Authorizing Provider  aspirin 81 MG EC tablet Take 81 mg by mouth daily.     Yes Historical Provider, MD  Cholecalciferol (VITAMIN D3) 2000 UNITS TABS Take 2,000 Units by mouth daily.     Yes Historical Provider, MD  estradiol (ESTRACE) 0.1 MG/GM vaginal cream Place 2 g vaginally daily. UAD    Yes Historical Provider, MD  NON FORMULARY Donolent vitamin     Yes Historical Provider, MD    Family History  Family History  Problem Relation Age of Onset  . COPD Mother     alive @ 23  . Stroke Father     died @ 74    Social History  History   Social  History  . Marital Status: Married    Spouse Name: N/A    Number of Children: N/A  . Years of Education: N/A   Occupational History  . Not on file.   Social History Main Topics  . Smoking status: Never Smoker   . Smokeless tobacco: Not on file   Comment: tobacco use -no  . Alcohol Use: No  . Drug Use: No  . Sexually Active: Not on file   Other Topics Concern  . Not on file   Social History Narrative   Lives in [place] with husband.  Works out regularly.  Works as a Teacher, adult education - owns own business.      Review of Systems General:  No chills, fever, night sweats or weight changes.  Cardiovascular:  Z++ chest pain as outlined above.  No dyspnea on exertion, edema, orthopnea, palpitations, paroxysmal nocturnal dyspnea. Dermatological:  No rash, lesions/masses Respiratory: No cough, dyspnea Urologic: No hematuria, dysuria Abdominal:   No nausea, vomiting, diarrhea, bright red blood per rectum, melena, or hematemesis Neurologic:  ++ Ss of aphasia, right sided facial numbness/paresthesias and tongue fullness as outlined above.  No visual changes, wkns, changes in mental status. All other systems reviewed and are otherwise negative except as noted above.  Physical Exam  Blood pressure 150/78, pulse 88, height 5\' 4"  (1.626 m), weight 167 lb (75.751 kg).  General: Pleasant, NAD Psych: Normal affect. Neuro: Alert and oriented X 3. Moves all extremities spontaneously. HEENT: Normal  Neck: Supple without bruits or JVD. Lungs:  Resp regular and unlabored, CTA. Heart: RRR no s3, s4, or murmurs. Abdomen: Soft, non-tender, non-distended, BS + x 4.  Extremities: No clubbing, cyanosis or edema. DP/PT/Radials 2+ and equal bilaterally.  Accessory Clinical Findings  ECG - RSR, 73, no acute findings.  Assessment & Plan  1.  Right-sided facial weakness/paresthesias/aphasia:  She has had intermittent symptoms since the fall of 2012.  This is been followed by neurology and etiology remains unclear.  From a cardiac assessment standpoint, we will schedule a 2-D echocardiogram with bubble study as previously considered by Dr. Mariah Milling, to rule out PFO or ASD.  Provided that this is normal, would defer any additional evaluation to neurology.  2.  Chest pain, midsternal:  Fairly atypical in description however she is pending surgery next month.  This being the case, we will arrange for an exercise Myoview to rule out ischemia.  Her ECG is normal.  3.  History of AV block:  Status post pacemaker placement.  She is scheduled to transmit remotely tomorrow.  4.  Asc AO Aneurysm:  Stable by last echo 04/2011.  Reassess in 04/2012.  5.  Disposition:  Followup in 4-6 weeks.   Nicolasa Ducking, NP 10/06/2011, 1:11 PM

## 2011-10-07 ENCOUNTER — Encounter: Payer: Self-pay | Admitting: Internal Medicine

## 2011-10-07 ENCOUNTER — Ambulatory Visit (INDEPENDENT_AMBULATORY_CARE_PROVIDER_SITE_OTHER): Payer: BC Managed Care – PPO | Admitting: *Deleted

## 2011-10-07 DIAGNOSIS — I495 Sick sinus syndrome: Secondary | ICD-10-CM

## 2011-10-07 DIAGNOSIS — I441 Atrioventricular block, second degree: Secondary | ICD-10-CM

## 2011-10-12 ENCOUNTER — Ambulatory Visit: Payer: Self-pay | Admitting: Cardiovascular Disease

## 2011-10-12 DIAGNOSIS — G459 Transient cerebral ischemic attack, unspecified: Secondary | ICD-10-CM

## 2011-10-12 DIAGNOSIS — I359 Nonrheumatic aortic valve disorder, unspecified: Secondary | ICD-10-CM

## 2011-10-12 LAB — REMOTE PACEMAKER DEVICE
AL AMPLITUDE: 2.8 mv
AL THRESHOLD: 0.375 V
ATRIAL PACING PM: 5
BAMS-0001: 140 {beats}/min
RV LEAD AMPLITUDE: 22.4 mv
RV LEAD IMPEDENCE PM: 604 Ohm
RV LEAD THRESHOLD: 0.875 V
VENTRICULAR PACING PM: 1

## 2011-10-13 NOTE — Progress Notes (Signed)
Remote pacer remote   

## 2011-10-15 ENCOUNTER — Encounter: Payer: Self-pay | Admitting: *Deleted

## 2011-10-15 NOTE — Telephone Encounter (Signed)
Spoke with  This encounter was created in error - please disregard.

## 2011-10-19 ENCOUNTER — Ambulatory Visit: Payer: Self-pay | Admitting: Obstetrics and Gynecology

## 2011-10-19 LAB — BASIC METABOLIC PANEL
Calcium, Total: 8.8 mg/dL (ref 8.5–10.1)
Chloride: 109 mmol/L — ABNORMAL HIGH (ref 98–107)
EGFR (Non-African Amer.): >60
Glucose: 81 mg/dL (ref 65–99)
Osmolality: 287 (ref 275–301)
Potassium: 3.8 mmol/L (ref 3.5–5.1)
Sodium: 144 mmol/L (ref 136–145)

## 2011-10-19 LAB — CBC
HGB: 13.7 g/dL (ref 12.0–16.0)
MCV: 89 fL (ref 80–100)
RBC: 4.49 10*6/uL (ref 3.80–5.20)
WBC: 5.3 10*3/uL (ref 3.6–11.0)

## 2011-10-21 ENCOUNTER — Telehealth: Payer: Self-pay | Admitting: Cardiovascular Disease

## 2011-10-21 NOTE — Telephone Encounter (Signed)
Needs letter for surgical clearance fax to [phone]

## 2011-10-22 ENCOUNTER — Encounter: Payer: Self-pay | Admitting: *Deleted

## 2011-10-25 NOTE — Telephone Encounter (Signed)
Patient is going for laparoscopic vaginal hysterectomy with general anesthesia, anterior repair with Dr. Greggory Keen from [facility] clinic and Dr. Achilles Dunk will do an excision of graft. She needs a cardiac clearance letter faxed if okay for clearance.

## 2011-10-26 ENCOUNTER — Ambulatory Visit: Payer: Self-pay | Admitting: Obstetrics and Gynecology

## 2011-10-26 NOTE — Telephone Encounter (Signed)
Please send preop clearance letter to surgeon's as listed She would be acceptable risk. No further study/images needed.

## 2011-10-27 ENCOUNTER — Encounter: Payer: Self-pay | Admitting: *Deleted

## 2011-10-27 LAB — HEMOGLOBIN: HGB: 11 g/dL — ABNORMAL LOW (ref 12.0–16.0)

## 2011-10-27 NOTE — Telephone Encounter (Signed)
Letter faxed to [facility] clinic as requested per Dr. Hebert Soho MD.

## 2011-10-27 NOTE — Telephone Encounter (Signed)
Letter faxed to [facility] clinic at [phone] for cardiac clearance.

## 2011-11-23 ENCOUNTER — Ambulatory Visit: Payer: Self-pay | Admitting: Obstetrics and Gynecology

## 2011-12-29 ENCOUNTER — Ambulatory Visit: Payer: Self-pay | Admitting: Family Medicine

## 2012-01-14 ENCOUNTER — Encounter: Payer: BC Managed Care – PPO | Admitting: *Deleted

## 2012-01-17 ENCOUNTER — Encounter: Payer: Self-pay | Admitting: Internal Medicine

## 2012-01-17 ENCOUNTER — Encounter: Payer: Self-pay | Admitting: *Deleted

## 2012-01-17 ENCOUNTER — Ambulatory Visit (INDEPENDENT_AMBULATORY_CARE_PROVIDER_SITE_OTHER): Payer: BC Managed Care – PPO | Admitting: *Deleted

## 2012-01-17 DIAGNOSIS — I441 Atrioventricular block, second degree: Secondary | ICD-10-CM

## 2012-02-15 ENCOUNTER — Other Ambulatory Visit: Payer: Self-pay | Admitting: Physician Assistant

## 2012-02-15 LAB — COMPREHENSIVE METABOLIC PANEL
Albumin: 3.9 g/dL (ref 3.4–5.0)
BUN: 10 mg/dL (ref 7–18)
Bilirubin,Total: 0.8 mg/dL (ref 0.2–1.0)
Chloride: 108 mmol/L — ABNORMAL HIGH (ref 98–107)
Co2: 32 mmol/L (ref 21–32)
Creatinine: 0.64 mg/dL (ref 0.60–1.30)
EGFR (African American): >60
Glucose: 88 mg/dL (ref 65–99)
SGOT(AST): 29 U/L (ref 15–37)
SGPT (ALT): 26 U/L (ref 12–78)
Total Protein: 7.4 g/dL (ref 6.4–8.2)

## 2012-02-15 LAB — HEMOGLOBIN A1C: Hemoglobin A1C: 5.7 % (ref 4.2–6.3)

## 2012-02-17 LAB — CBC WITH DIFFERENTIAL/PLATELET
Basophil #: 0 10*3/uL (ref 0.0–0.1)
Eosinophil #: 0.1 10*3/uL (ref 0.0–0.7)
Lymphocyte %: 22.8 %
MCH: 30.2 pg (ref 26.0–34.0)
MCV: 89 fL (ref 80–100)
Monocyte %: 3.5 %
Neutrophil #: 4.2 10*3/uL (ref 1.4–6.5)
Neutrophil %: 71.3 %
Platelet: 171 10*3/uL (ref 150–440)
RBC: 4.83 10*6/uL (ref 3.80–5.20)
RDW: 13.1 % (ref 11.5–14.5)
WBC: 5.9 10*3/uL (ref 3.6–11.0)

## 2012-02-28 ENCOUNTER — Telehealth: Payer: Self-pay | Admitting: Internal Medicine

## 2012-02-28 NOTE — Telephone Encounter (Signed)
Per pt call she sent her transmission in July and still has not heard anything and is a little upset because she called before and she still has not heard anything and she doesn't know who she talk to. I told her you where really busy today and might not get back to her today but you will try

## 2012-02-29 NOTE — Telephone Encounter (Signed)
LMOM [id number] in regards to transmission.

## 2012-03-03 LAB — REMOTE PACEMAKER DEVICE
AL AMPLITUDE: 2.8 mv
AL THRESHOLD: 0.375 V
ATRIAL PACING PM: 4
BAMS-0001: 140 {beats}/min
RV LEAD THRESHOLD: 1.125 V
VENTRICULAR PACING PM: 1

## 2012-03-23 ENCOUNTER — Encounter: Payer: Self-pay | Admitting: *Deleted

## 2012-04-18 ENCOUNTER — Encounter: Payer: Self-pay | Admitting: *Deleted

## 2012-04-25 ENCOUNTER — Ambulatory Visit (INDEPENDENT_AMBULATORY_CARE_PROVIDER_SITE_OTHER): Payer: BC Managed Care – PPO | Admitting: Internal Medicine

## 2012-04-25 ENCOUNTER — Ambulatory Visit: Payer: Self-pay | Admitting: Chiropractic Medicine

## 2012-04-25 ENCOUNTER — Encounter: Payer: Self-pay | Admitting: Internal Medicine

## 2012-04-25 VITALS — BP 108/82 | HR 76 | Ht 64.0 in | Wt 171.0 lb

## 2012-04-25 DIAGNOSIS — I441 Atrioventricular block, second degree: Secondary | ICD-10-CM

## 2012-04-25 DIAGNOSIS — R0602 Shortness of breath: Secondary | ICD-10-CM

## 2012-04-25 DIAGNOSIS — R079 Chest pain, unspecified: Secondary | ICD-10-CM

## 2012-04-25 DIAGNOSIS — Z950 Presence of cardiac pacemaker: Secondary | ICD-10-CM

## 2012-04-25 DIAGNOSIS — R55 Syncope and collapse: Secondary | ICD-10-CM

## 2012-04-25 LAB — PACEMAKER DEVICE OBSERVATION
AL AMPLITUDE: 2.8 mv
AL IMPEDENCE PM: 469 Ohm
RV LEAD IMPEDENCE PM: 648 Ohm
RV LEAD THRESHOLD: 1 V
VENTRICULAR PACING PM: 1

## 2012-04-25 NOTE — Patient Instructions (Addendum)
Your physician wants you to follow-up in: 1 year with Dr. Graciela Husbands. July 31, 2012 [facility] Transmission. You will receive a reminder letter in the mail two months in advance. If you don't receive a letter, please call our office to schedule the follow-up appointment.

## 2012-04-25 NOTE — Assessment & Plan Note (Signed)
No recurrent syncope 

## 2012-04-25 NOTE — Assessment & Plan Note (Signed)
The patient's device was interrogated.  The information was reviewed. No changes were made in the programming.    

## 2012-04-25 NOTE — Progress Notes (Signed)
Patient Care Team: Vonita Moss, MD as PCP - General (Unknown Physician Specialty)   HPI  Wendy Kelly is a 56 y.o. 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 She received a Medtronic adapa device in 2006   Her history is interesting in that she had a lifelong history of recurrent syncope. She was told repeatedly that she had low blood pressure. Her pauses that were detected by monitoring occurred at night. Notwithstanding this following the pacemaker insertion, she has had no recurrent syncope.  She has a history of normal left ventricular function by recent echo.   She is doing well except for stress with her mom    Past Medical History  Diagnosis Date  . AV block, 2nd degree     a. 05/2005 - s/p MDT Adapta ADDR01 Dual Chamber PPM  . Syncope     a. 04/2011 Echo: EF 55-65%, No RWMA, Gr 1 DD.  . AA (aortic aneurysm)     a. 04/2011 Echo: Ao Root: 4.1cm, Asc Ao 4.7cm.  . Depression   . Fatigue   . Expressive aphasia     a. ongoing since 04/2011 - seen by neurology - ? TIA vs. Migraine  . Facial numbness     a. ongoing since 04/2011 - seen by neurology - ? TIA vs. Migraine  . Anxiety   . Chest pain     a. 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    Past Surgical History  Procedure Date  . Pacemaker insertion 2006    Medtronic  . Cataract 2004  . Tonsillectomy   . Bladder tack   . Cholecystectomy 2000  . Vaginal hysterectomy   . Abdominal wall mesh  removal     Current Outpatient Prescriptions  Medication Sig Dispense Refill  . aspirin 81 MG EC tablet Take 81 mg by mouth daily.        . Estrogens Conjugated (PREMARIN PO) Take by mouth 2 (two) times a week.      Marland Kitchen ibuprofen (ADVIL,MOTRIN) 200 MG tablet Takes 2-3 tablets as needed.      . Loratadine (CLARITIN PO) Take by mouth daily.      Marland Kitchen LORazepam (ATIVAN) 1 MG tablet 0.5 mg as needed.      . Multiple Vitamin (MULTIVITAMIN) tablet Take 1 tablet by mouth daily.      . NON  FORMULARY Donolent vitamin          Allergies  Allergen Reactions  . Cefdinir     unknown   . Codeine   . Entex Lq (Phenylephrine-Guaifenesin)   . Guaifenesin & Derivatives   . Naproxen Sodium     Itching/rash  . Oxycodone   . Penicillins     Rash/hives  . Sulfonamide Derivatives     Rash/itching    Review of Systems negative except from HPI and PMH  Physical Exam BP 108/82  Pulse 76  Ht 5\' 4"  (1.626 m)  Wt 171 lb (77.565 kg)  BMI 29.35 kg/m2 Well developed and well nourished in no acute distress HENT normal E scleral and icterus clear Neck Supple JVP flat; carotids brisk and full Clear to ausculation *Regular rate and rhythm, no murmurs gallops or rub Soft with active bowel sounds No clubbing cyanosis none Edema Alert and oriented, grossly normal motor and sensory function Skin Warm and Dry    Assessment and  Plan

## 2012-04-28 ENCOUNTER — Ambulatory Visit: Payer: Self-pay | Admitting: Physician Assistant

## 2012-04-28 LAB — CREATININE, SERUM
Creatinine: 0.62 mg/dL
EGFR (African American): >60
EGFR (Non-African Amer.): >60

## 2012-05-01 ENCOUNTER — Ambulatory Visit: Payer: Self-pay | Admitting: General Practice

## 2012-05-18 ENCOUNTER — Emergency Department: Payer: Self-pay | Admitting: Emergency Medicine

## 2012-05-18 LAB — CBC
HCT: 40.8 %
HGB: 13.9 g/dL
MCH: 30 pg
MCHC: 34.1 g/dL
MCV: 88 fL
Platelet: 156 x10 3/mm 3
RBC: 4.64 X10 6/mm 3
RDW: 12.9 %
WBC: 5.3 x10 3/mm 3

## 2012-05-18 LAB — COMPREHENSIVE METABOLIC PANEL
BUN: 16 mg/dL (ref 7–18)
Bilirubin,Total: 0.9 mg/dL (ref 0.2–1.0)
Chloride: 110 mmol/L — ABNORMAL HIGH (ref 98–107)
Co2: 29 mmol/L (ref 21–32)
Creatinine: 0.7 mg/dL (ref 0.60–1.30)
EGFR (Non-African Amer.): >60
Potassium: 3.9 mmol/L (ref 3.5–5.1)
Sodium: 146 mmol/L — ABNORMAL HIGH (ref 136–145)
Total Protein: 7 g/dL (ref 6.4–8.2)

## 2012-05-18 LAB — TROPONIN I
Troponin-I: <0.02 ng/mL
Troponin-I: <0.02 ng/mL

## 2012-05-18 LAB — CK TOTAL AND CKMB (NOT AT ARMC): CK-MB: 1.1 ng/mL (ref 0.5–3.6)

## 2012-05-22 ENCOUNTER — Encounter: Payer: Self-pay | Admitting: Cardiovascular Disease

## 2012-05-22 ENCOUNTER — Ambulatory Visit (INDEPENDENT_AMBULATORY_CARE_PROVIDER_SITE_OTHER): Payer: BC Managed Care – PPO | Admitting: Cardiovascular Disease

## 2012-05-22 VITALS — BP 102/70 | HR 86 | Ht 64.0 in | Wt 170.0 lb

## 2012-05-22 DIAGNOSIS — R002 Palpitations: Secondary | ICD-10-CM

## 2012-05-22 DIAGNOSIS — R079 Chest pain, unspecified: Secondary | ICD-10-CM

## 2012-05-22 DIAGNOSIS — I719 Aortic aneurysm of unspecified site, without rupture: Secondary | ICD-10-CM

## 2012-05-22 MED ORDER — NITROGLYCERIN 0.4 MG SL SUBL: 0.4000 mg | SUBLINGUAL_TABLET | SUBLINGUAL | Status: DC | PRN

## 2012-05-22 MED ORDER — PROPRANOLOL HCL 20 MG PO TABS: 20.0000 mg | ORAL_TABLET | Freq: Three times a day (TID) | ORAL | Status: DC | PRN

## 2012-05-22 NOTE — Progress Notes (Signed)
Patient ID: Wendy Kelly, female    DOB: October 27, 1955, 56 y.o.   MRN: [id number]  HPI Comments: Wendy Kelly is a very pleasant 56 year old woman, patient of Dr. 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 She presents for routine followup  The events of the right facial droop and expressive aphasia occurred in October 2012. She feels this happened after a flu shot. She reports having similar symptoms this year after a flu shot.  She's had significant stress at home as she has been caring for her mother and helping to plan for her daughter's wedding next year. She had to live with her mother for 5 weeks which was a very stressful period. She has had multiple surgeries over the past year including hysterectomy.  Recent episode of tachycardia followed by chest pain 05/18/2012. Workup in the hospital was essentially negative. She had CT scan of the chest showing stable 4.7 cm descending aorta aneurysm. No mention of coronary artery disease. She had workup including carotid ultrasound that showed no significant disease.  Echocardiogram had been done in September 2012 prior to this episode and bubble study was not performed.  Prior stress test in March 2013 showing no ischemia. She has been given Ativan for sleep and anxiety but has not tried this on regular basis. She does have problems sleeping  EKG shows normal sinus rhythm with rate 86 beats per minute with no significant ST or T wave changes  Last echocardiogram September 2012 showing a descending aorta 4.7 cm with little change from prior  echocardiogram. Total cholesterol 153, LDL 73, HDL 71  (04/2011)      Outpatient Encounter Prescriptions as of 05/22/2012  Medication Sig Dispense Refill  . Ascorbic Acid (VITAMIN C PO) Take by mouth daily.      Marland Kitchen aspirin 81 MG EC tablet Take 81 mg by mouth daily.        Marland Kitchen CALCIUM-VITAMIN D PO Take by mouth daily.      . Cetirizine HCl (ZYRTEC PO) Take by mouth as needed.      Marland Kitchen estradiol (ESTRACE) 0.1 MG/GM vaginal cream Place 2 g vaginally as needed.      Marland Kitchen ibuprofen (ADVIL,MOTRIN) 200 MG tablet Takes 2-3 tablets as needed.      . Loratadine (CLARITIN PO) Take by mouth daily.      Marland Kitchen LORazepam (ATIVAN) 1 MG tablet 0.5 mg as needed.      . Multiple Vitamin (MULTIVITAMIN) tablet Take 2 tablets by mouth daily.       . NON FORMULARY Dolovent daily.         Review of Systems  Constitutional: Negative.   HENT: Negative.   Eyes: Negative.   Respiratory: Negative.   Cardiovascular: Positive for chest pain and palpitations.  Gastrointestinal: Negative.   Musculoskeletal: Negative.   Skin: Negative.   Neurological: Negative.   Hematological: Negative.   Psychiatric/Behavioral: Negative.   All other systems reviewed and are negative.    BP 102/70  Pulse 86  Ht 5\' 4"  (1.626 m)  Wt 170 lb (77.111 kg)  BMI 29.18 kg/m2  Physical Exam  Nursing note and vitals  reviewed. Constitutional: She is oriented to person, place, and time. She appears well-developed and well-nourished.  HENT:  Head: Normocephalic.  Nose: Nose normal.  Mouth/Throat: Oropharynx is clear and moist.  Eyes: Conjunctivae normal are normal. Pupils are equal, round, and reactive to light.  Neck: Normal range of motion. Neck supple. No JVD present.  Cardiovascular: Normal rate, regular rhythm, S1 normal, S2 normal, normal heart sounds and intact distal pulses.  Exam reveals no gallop and no friction rub.   No murmur heard. Pulmonary/Chest: Effort normal and breath sounds normal. No respiratory distress. She has no  wheezes. She has no rales. She exhibits no tenderness.  Abdominal: Soft. Bowel sounds are normal. She exhibits no distension. There is no tenderness.  Musculoskeletal: Normal range of motion. She exhibits no edema and no tenderness.  Lymphadenopathy:    She has no cervical adenopathy.  Neurological: She is alert and oriented to person, place, and time. Coordination normal.  Skin: Skin is warm and dry. No rash noted. No erythema.  Psychiatric: She has a normal mood and affect. Her behavior is normal. Judgment and thought content normal.         Assessment and Plan

## 2012-05-22 NOTE — Assessment & Plan Note (Signed)
Chest pain is likely atypical in nature. Significant recent stressors. Stress test earlier this year, recent normal/stable CT scan. We have recommended she try nitroglycerin sublingual when necessary for chest pain.

## 2012-05-22 NOTE — Assessment & Plan Note (Signed)
Recent episode of atrial tachycardia by her account with chest pain after 20 minutes. We have recommended she try propranolol as needed for further episodes of tachycardia.

## 2012-05-22 NOTE — Patient Instructions (Addendum)
You are doing well.  Please take propranolol 1/2 to 1 pill as needed for tachycardia, palpitations  Please take nitroglycerin for chest pain, ok to try a 1/2 pill first  Please call us if you have new issues that need to be addressed before your next appt.  Your physician wants you to follow-up in: 6 months.  You will receive a reminder letter in the mail two months in advance. If you don't receive a letter, please call our office to schedule the follow-up appointment.

## 2012-05-22 NOTE — Assessment & Plan Note (Signed)
Unchanged aneurysm by recent CT scan. Will recommend annual echocardiogram to monitor

## 2012-06-12 ENCOUNTER — Ambulatory Visit: Payer: Self-pay | Admitting: Ophthalmology

## 2012-06-13 ENCOUNTER — Ambulatory Visit: Payer: BC Managed Care – PPO | Admitting: Cardiovascular Disease

## 2012-07-31 ENCOUNTER — Ambulatory Visit (INDEPENDENT_AMBULATORY_CARE_PROVIDER_SITE_OTHER): Payer: BC Managed Care – PPO | Admitting: *Deleted

## 2012-07-31 ENCOUNTER — Encounter: Payer: Self-pay | Admitting: Internal Medicine

## 2012-07-31 DIAGNOSIS — Z950 Presence of cardiac pacemaker: Secondary | ICD-10-CM

## 2012-07-31 DIAGNOSIS — I441 Atrioventricular block, second degree: Secondary | ICD-10-CM

## 2012-08-02 LAB — REMOTE PACEMAKER DEVICE
BAMS-0001: 140 {beats}/min
BATTERY VOLTAGE: 2.75 V
VENTRICULAR PACING PM: 0

## 2012-08-05 ENCOUNTER — Telehealth: Payer: Self-pay | Admitting: Physician Assistant

## 2012-08-05 ENCOUNTER — Emergency Department: Payer: Self-pay | Admitting: Emergency Medicine

## 2012-08-05 LAB — CBC
HCT: 40.1 % (ref 35.0–47.0)
MCH: 30.4 pg (ref 26.0–34.0)
MCV: 87 fL (ref 80–100)
RBC: 4.59 10*6/uL (ref 3.80–5.20)
RDW: 12.7 % (ref 11.5–14.5)
WBC: 6.5 10*3/uL (ref 3.6–11.0)

## 2012-08-05 LAB — BASIC METABOLIC PANEL
Anion Gap: 9 (ref 7–16)
BUN: 11 mg/dL (ref 7–18)
Calcium, Total: 8.8 mg/dL (ref 8.5–10.1)
Co2: 23 mmol/L (ref 21–32)
EGFR (African American): >60
EGFR (Non-African Amer.): >60
Sodium: 143 mmol/L (ref 136–145)

## 2012-08-05 LAB — CK TOTAL AND CKMB (NOT AT ARMC)
CK-MB: 1.1 ng/mL (ref 0.5–3.6)
CK-MB: 0.6 ng/mL (ref 0.5–3.6)

## 2012-08-05 LAB — TROPONIN I: Troponin-I: <0.02 ng/mL

## 2012-08-05 NOTE — Telephone Encounter (Signed)
Patient called in with onset of chest pain today described as burning with radiation to left shoulder and associated belching.  She has tried NTG x 3 with improvement, but no resolution.  She notes continuous pain while on the phone with me.  No associated dyspnea, nausea, syncope.   I have advised her to call 911 to be transported to the ED for further evaluation. She agrees with this plan. Tereso Newcomer, PA-C  7:13 PM 08/05/2012

## 2012-08-07 ENCOUNTER — Encounter: Payer: Self-pay | Admitting: Cardiovascular Disease

## 2012-08-07 ENCOUNTER — Ambulatory Visit (INDEPENDENT_AMBULATORY_CARE_PROVIDER_SITE_OTHER): Payer: BC Managed Care – PPO | Admitting: Cardiovascular Disease

## 2012-08-07 ENCOUNTER — Telehealth: Payer: Self-pay | Admitting: *Deleted

## 2012-08-07 VITALS — BP 124/74 | HR 79 | Ht 64.0 in | Wt 167.0 lb

## 2012-08-07 DIAGNOSIS — Z950 Presence of cardiac pacemaker: Secondary | ICD-10-CM

## 2012-08-07 DIAGNOSIS — R079 Chest pain, unspecified: Secondary | ICD-10-CM

## 2012-08-07 DIAGNOSIS — I719 Aortic aneurysm of unspecified site, without rupture: Secondary | ICD-10-CM

## 2012-08-07 DIAGNOSIS — R002 Palpitations: Secondary | ICD-10-CM

## 2012-08-07 DIAGNOSIS — I77810 Thoracic aortic ectasia: Secondary | ICD-10-CM

## 2012-08-07 NOTE — Assessment & Plan Note (Signed)
2 weeks of stuttering chest pain, trips to the emergency room, radiating pain to the neck and the left arm. We will order a stress Myoview. If this shows no ischemia, may need to repeat echocardiogram to evaluate ascending aorta aneurysm.

## 2012-08-07 NOTE — Telephone Encounter (Signed)
Patient was seen today Stress test ordered

## 2012-08-07 NOTE — Telephone Encounter (Signed)
Pt says she went to ED 1/25 for c/o CP that was radiating to left neck. Took NTG SL x 3 with no relief, called 911 Went to [facility] and was told they were going to admit her to r/o MI but then says ED MD talked with Dr. Mariah Milling who told them she needs outpt stress test and ok to d/c home Pt says she had another episode last night that began as SS CP and radiated to left neck Pt begs, "please help me" She denies any acute distress at this time Worked her in with Dr. Mariah Milling this am at 1100

## 2012-08-07 NOTE — Assessment & Plan Note (Signed)
Followup in [place] with periodic downloads. No near syncope or syncope

## 2012-08-07 NOTE — Patient Instructions (Addendum)
  Please start omeprazole twice a day Ok to take with generic pepcid/zantac (take 2 at a time) Ok take with tums   We will schedule a stress test for chest pain  We will schedule a echocardiogram in April 2014 for ascending aorta dilitation  Ok to Use NTG and soda for chest symptoms  Please call us if you have new issues that need to be addressed before your next appt.   [facility] MYOVIEW  Your caregiver has ordered a Stress Test with nuclear imaging. The purpose of this test is to evaluate the blood supply to your heart muscle. This procedure is referred to as a "Non-Invasive Stress Test." This is because other than having an IV started in your vein, nothing is inserted or "invades" your body. Cardiac stress tests are done to find areas of poor blood flow to the heart by determining the extent of coronary artery disease (CAD). Some patients exercise on a treadmill, which naturally increases the blood flow to your heart, while others who are  unable to walk on a treadmill due to physical limitations have a pharmacologic/chemical stress agent called Lexiscan . This medicine will mimic walking on a treadmill by temporarily increasing your coronary blood flow.   Please note: these test may take anywhere between 2-4 hours to complete  PLEASE REPORT TO [facility] ENTRANCE  THE VOLUNTEERS AT THE FIRST DESK WILL DIRECT YOU WHERE TO GO  Date of Procedure:_________1/29/14____________________________  Arrival Time for Procedure:_______7:15 am_______________________  Instructions regarding medication:   _n/a___ : Hold diabetes medication morning of procedure  __x__:  Hold betablocker(s) night before procedure and morning of procedure(inderal, propanolol)  __n/a__:  Hold other medications as  follows:_________________________________________________________________________________________________________________________________________________________________________________________________________________________________________________________________________________________  PLEASE NOTIFY THE OFFICE AT LEAST 24 HOURS IN ADVANCE IF YOU ARE UNABLE TO KEEP YOUR APPOINTMENT.  [phone]  How to prepare for your Myoview test:  1. Do not eat or drink after midnight 2. No caffeine for 24 hours prior to test 3. Your medication may be taken with water.  If your doctor stopped a medication because of this test, do not take that medication. 4. Ladies, please do not wear dresses.  Skirts or pants are appropriate. Please wear a short sleeve shirt. 5. No perfume, cologne or lotion. 6. Wear comfortable walking shoes. No heels!

## 2012-08-07 NOTE — Assessment & Plan Note (Signed)
We'll schedule repeat echocardiogram on annual basis, April 2014 or earlier for worsening chest pain

## 2012-08-07 NOTE — Assessment & Plan Note (Signed)
Occasional palpitations. We'll continue current medications

## 2012-08-07 NOTE — Progress Notes (Signed)
Patient ID: Wendy Kelly, female    DOB: Jan 17, 1956, 57 y.o.   MRN: [id number]  HPI Comments: Wendy Kelly is a very pleasant 57 year old woman, patient of Dr. 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 She presents for routine followup  Last stress test was April 2013 performed for chest pain. Equivocal study, no large regions of ischemia, some attenuation artifact noted. She reports that she was recently in the emergency room for chest pain. Over the past 2 weeks, she has had waxing, waning chest pain with pain radiating up into her neck and left arm. She is very anxious about her pain as it has been uncomfortable. Lots of burping and some GI upset but mainly a tightness in her mediastinal area. Uncertain if there is correlation to increase activity. She has not been working out at [employer] as much since her symptoms. Cardiac enzymes in the hospital were negative. EKG was normal. ER doctor did not want to repeat chest CT and she had one several months ago. Some problems sleeping  episode of tachycardia followed by chest pain 05/18/2012. Workup in the hospital was essentially negative. She had CT scan of the chest showing stable 4.7 cm descending aorta aneurysm. No mention of coronary artery disease. She had workup including carotid ultrasound that showed no significant disease.  Echocardiogram had been done in September 2012 prior to this episode and bubble study was not performed.  EKG shows normal sinus rhythm with rate 79 beats per minute with no significant ST or T wave changes  Last echocardiogram September 2012 showing a descending aorta 4.7 cm with little  change from prior echocardiogram. Total cholesterol 153, LDL 73, HDL 71  (04/2011)     Outpatient Encounter Prescriptions as of 08/07/2012  Medication Sig Dispense Refill  . Ascorbic Acid (VITAMIN C PO) Take by mouth daily.      Marland Kitchen aspirin 81 MG EC tablet Take 81 mg by mouth daily.        . B Complex Vitamins (VITAMIN B COMPLEX PO) Take by mouth daily.      Marland Kitchen CALCIUM-VITAMIN D PO Take by mouth daily.      Marland Kitchen estradiol (ESTRACE) 0.1 MG/GM vaginal cream Place 2 g vaginally as needed.      Marland Kitchen ibuprofen (ADVIL,MOTRIN) 200 MG tablet Takes 2-3 tablets as needed.      . Loratadine (CLARITIN PO) Take by mouth daily.      Marland Kitchen LORazepam (ATIVAN) 1 MG tablet 0.5 mg as needed.      . Multiple Vitamin (MULTIVITAMIN) tablet Take 2 tablets by mouth daily.       . nitroGLYCERIN (NITROSTAT) 0.4 MG SL tablet Place 1 tablet (0.4 mg total) under the tongue every 5 (five) minutes as needed for chest pain.  25 tablet  6  . NON FORMULARY Dolovent daily.      . Nutritional Supplements (DHEA PO) Take 5 mg by mouth. Take two tablets in the am      . propranolol (INDERAL) 20 MG tablet Take 1 tablet (20 mg total) by mouth 3 (three) times daily as needed.  60 tablet  3  . [DISCONTINUED] Cetirizine HCl (ZYRTEC PO) Take by mouth as needed.  Review of Systems  Constitutional: Negative.   HENT: Negative.   Eyes: Negative.   Respiratory: Negative.   Cardiovascular: Positive for chest pain.  Gastrointestinal: Negative.   Musculoskeletal: Negative.   Skin: Negative.   Neurological: Negative.   Hematological: Negative.   Psychiatric/Behavioral: Negative.   All other systems reviewed and are negative.    BP 124/74  Pulse 79  Ht 5\' 4"  (1.626 m)  Wt 167 lb (75.751 kg)  BMI 28.67 kg/m2  Physical Exam  Nursing note and vitals reviewed. Constitutional: She is oriented to person, place, and time. She appears well-developed and well-nourished.  HENT:  Head: Normocephalic.  Nose: Nose normal.  Mouth/Throat:  Oropharynx is clear and moist.  Eyes: Conjunctivae normal are normal. Pupils are equal, round, and reactive to light.  Neck: Normal range of motion. Neck supple. No JVD present.  Cardiovascular: Normal rate, regular rhythm, S1 normal, S2 normal, normal heart sounds and intact distal pulses.  Exam reveals no gallop and no friction rub.   No murmur heard. Pulmonary/Chest: Effort normal and breath sounds normal. No respiratory distress. She has no wheezes. She has no rales. She exhibits no tenderness.  Abdominal: Soft. Bowel sounds are normal. She exhibits no distension. There is no tenderness.  Musculoskeletal: Normal range of motion. She exhibits no edema and no tenderness.  Lymphadenopathy:    She has no cervical adenopathy.  Neurological: She is alert and oriented to person, place, and time. Coordination normal.  Skin: Skin is warm and dry. No rash noted. No erythema.  Psychiatric: She has a normal mood and affect. Her behavior is normal. Judgment and thought content normal.         Assessment and Plan

## 2012-08-07 NOTE — Telephone Encounter (Signed)
Pt lm on vm concerning ed visit over the weekend. (see phone note) pt stated that she had another episode last night.

## 2012-08-08 ENCOUNTER — Encounter: Payer: Self-pay | Admitting: *Deleted

## 2012-08-09 ENCOUNTER — Ambulatory Visit: Payer: Self-pay | Admitting: Cardiovascular Disease

## 2012-08-09 DIAGNOSIS — R079 Chest pain, unspecified: Secondary | ICD-10-CM

## 2012-08-29 ENCOUNTER — Other Ambulatory Visit: Payer: Self-pay | Admitting: *Deleted

## 2012-08-29 DIAGNOSIS — R079 Chest pain, unspecified: Secondary | ICD-10-CM

## 2012-10-17 ENCOUNTER — Other Ambulatory Visit (INDEPENDENT_AMBULATORY_CARE_PROVIDER_SITE_OTHER): Payer: BC Managed Care – PPO

## 2012-10-17 ENCOUNTER — Other Ambulatory Visit: Payer: Self-pay

## 2012-10-17 DIAGNOSIS — I77810 Thoracic aortic ectasia: Secondary | ICD-10-CM

## 2012-10-23 ENCOUNTER — Encounter: Payer: Self-pay | Admitting: Cardiovascular Disease

## 2012-10-23 ENCOUNTER — Ambulatory Visit (INDEPENDENT_AMBULATORY_CARE_PROVIDER_SITE_OTHER): Payer: BC Managed Care – PPO | Admitting: Cardiovascular Disease

## 2012-10-23 VITALS — BP 114/72 | HR 68 | Ht 64.0 in | Wt 173.5 lb

## 2012-10-23 DIAGNOSIS — I719 Aortic aneurysm of unspecified site, without rupture: Secondary | ICD-10-CM

## 2012-10-23 DIAGNOSIS — I441 Atrioventricular block, second degree: Secondary | ICD-10-CM

## 2012-10-23 DIAGNOSIS — R072 Precordial pain: Secondary | ICD-10-CM

## 2012-10-23 DIAGNOSIS — R002 Palpitations: Secondary | ICD-10-CM

## 2012-10-23 DIAGNOSIS — Z950 Presence of cardiac pacemaker: Secondary | ICD-10-CM

## 2012-10-23 DIAGNOSIS — I7121 Aneurysm of the ascending aorta, without rupture: Secondary | ICD-10-CM

## 2012-10-23 DIAGNOSIS — R5381 Other malaise: Secondary | ICD-10-CM

## 2012-10-23 DIAGNOSIS — R5383 Other fatigue: Secondary | ICD-10-CM

## 2012-10-23 DIAGNOSIS — I712 Thoracic aortic aneurysm, without rupture: Secondary | ICD-10-CM

## 2012-10-23 NOTE — Assessment & Plan Note (Signed)
Continue scheduled PPM checks per EP.

## 2012-10-23 NOTE — Progress Notes (Signed)
Patient ID: Wendy Kelly, female    DOB: 04/11/1956, 57 y.o.   MRN: [id number]  HPI Comments: Wendy Kelly is a very pleasant 57 year old woman, patient of Dr. 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 Denies any significant chest pain.  She reports improved chest discomfort with PPI and intermittent antacid use. She does note markedly increased stress since 06/2012 caring for her ailing mother, and now planning her daughter's wedding which is in 11 days. She reports significant fatigue and poor sleep. She has recently started hormonal therapy with improvement. No active bleeding, h/o thyroid abnormalities. She does note occasional instances of low potassium in the hospital.   Last stress test was April 2013 performed for chest pain. Equivocal study, no large regions of ischemia, some attenuation artifact noted. Recurrent episodes of chest pain with trips to the emergency room. On these episodes, workup with EKG was normal. ER doctor did not want to repeat chest CT and she had one several months prior. Some problems sleeping. Significant anxiety with upcoming wedding.  episode of tachycardia followed by chest pain 05/18/2012. Workup in the hospital was essentially negative. She had CT scan of the chest showing stable 4.7 cm descending aorta aneurysm. No mention of coronary artery  disease. She had workup including carotid ultrasound that showed no significant disease.  Echocardiogram had been done in September 2012 prior to this episode and bubble study was not performed.  EKG shows normal sinus rhythm with rate 68 beats per minute with no significant ST or T wave changes  Last echocardiogram September 2012 showing a descending aorta 4.7 cm with little change from prior echocardiogram. Total cholesterol 153, LDL 73, HDL 71  (04/2011)     Outpatient Encounter Prescriptions as of 10/23/2012  Medication Sig Dispense Refill  . Ascorbic Acid (VITAMIN C PO) Take by mouth daily.      Marland Kitchen aspirin 81 MG EC tablet Take 81 mg by mouth daily.        . B Complex Vitamins (VITAMIN B COMPLEX PO) Take by mouth daily.      Marland Kitchen CALCIUM-VITAMIN D PO Take by mouth daily.      Marland Kitchen estradiol (CLIMARA - DOSED IN MG/24 HR) 0.025 mg/24hr Place 1 patch onto the skin.       Marland Kitchen estradiol (ESTRACE) 0.1 MG/GM vaginal cream Place 2 g vaginally as needed.      . fluticasone (FLONASE) 50 MCG/ACT nasal spray Place 1 spray into the nose daily.       Marland Kitchen ibuprofen (ADVIL,MOTRIN) 200 MG tablet Takes 2-3 tablets as needed.      . Loratadine (CLARITIN PO) Take by mouth daily.      Marland Kitchen LORazepam (ATIVAN) 1 MG tablet 0.5 mg as needed.      . Multiple Vitamin (MULTIVITAMIN) tablet Take 2 tablets by mouth daily.       . nitroGLYCERIN (NITROSTAT) 0.4 MG SL tablet Place 1 tablet (0.4 mg total) under the tongue  every 5 (five) minutes as needed for chest pain.  25 tablet  6  . Nutritional Supplements (DHEA PO) Take 5 mg by mouth. Take two tablets in the am      . omeprazole (PRILOSEC) 20 MG capsule Take 20 mg by mouth 2 (two) times daily.       . progesterone (PROMETRIUM) 100 MG capsule Take 100 mg by mouth daily.       . propranolol (INDERAL) 20 MG tablet Take 1 tablet (20 mg total) by mouth 3 (three) times daily as needed.  60 tablet  3  . [DISCONTINUED] NON FORMULARY Dolovent daily.       No facility-administered  encounter medications on file as of 10/23/2012.     Review of Systems  Constitutional: Negative.   HENT: Negative.   Eyes: Negative.   Respiratory: Negative.   Gastrointestinal: Negative.   Musculoskeletal: Negative.   Skin: Negative.   Neurological: Negative.   Psychiatric/Behavioral: Negative.   All other systems reviewed and are negative.    BP 114/72  Pulse 68  Ht 5\' 4"  (1.626 m)  Wt 173 lb 8 oz (78.699 kg)  BMI 29.77 kg/m2  Physical Exam  Nursing note and vitals reviewed. Constitutional: She is oriented to person, place, and time. She appears well-developed and well-nourished.  HENT:  Head: Normocephalic.  Nose: Nose normal.  Mouth/Throat: Oropharynx is clear and moist.  Eyes: Conjunctivae are normal. Pupils are equal, round, and reactive to light.  Neck: Normal range of motion. Neck supple. No JVD present.  Cardiovascular: Normal rate, regular rhythm, S1 normal, S2 normal, normal heart sounds and intact distal pulses.  Exam reveals no gallop and no friction rub.   No murmur heard. Pulmonary/Chest: Effort normal and breath sounds normal. No respiratory distress. She has no wheezes. She has no rales. She exhibits no tenderness.  Abdominal: Soft. Bowel sounds are normal. She exhibits no distension. There is no tenderness.  Musculoskeletal: Normal range of motion. She exhibits no edema and no tenderness.  Lymphadenopathy:    She has no cervical adenopathy.  Neurological: She is alert and oriented to person, place, and time. Coordination normal.  Skin: Skin is warm and dry. No rash noted. No erythema.  Psychiatric: She has a normal mood and affect. Her behavior is normal. Judgment and thought content normal.    Assessment and Plan

## 2012-10-23 NOTE — Assessment & Plan Note (Addendum)
4.8 cm on TTE earlier this month. This study could not rule out bicuspid AV/fusion of left and non-coronary cusps. Trileaflet AV is noted on prior serial echos. A bicuspid AV would lower the threshold to consider surgical management (4.0-5.0 cm). Taking this into account, the dimensions of her aneurysm have remained stable for several years, and certainly an approach of annual monitoring and evaluation of symptoms would be appropriate. Her risk factors are well-controlled. Additionally, a dual lumen was identified in some views felt to represent calcium shadowing versus dissection. Will plan to repeat TTE in the office for a repeat evaluation of the aforementioned findings prior to recommended advanced imaging modalities.

## 2012-10-23 NOTE — Patient Instructions (Addendum)
You are doing well. No medication changes were made.  Please call when the wedding is complete We will plan either a TEE or  Cardiac CT  Please call us if you have new issues that need to be addressed before your next appt.  Your physician wants you to follow-up in: 6 months.  You will receive a reminder letter in the mail two months in advance. If you don't receive a letter, please call our office to schedule the follow-up appointment.

## 2012-10-23 NOTE — Assessment & Plan Note (Signed)
Stress-mediated. She has been under a significant amount of stress since late last year caring for her ailing mother and now planning her daughter's wedding which is quickly approaching. Hormonal treatment for resultant insomnia is helping. Do not suspect an organic etiology- she will have labwork performed this summer as part of her annual check-up. Optimistic that once subacute life stressors resolve, so too will symptoms. Continue Ativan PRN.

## 2012-10-23 NOTE — Assessment & Plan Note (Signed)
Normal stress Myoview in January. PPI +/- antacids have improved these intermittent episodes. Suspect GI etiology. Discussed pairing with H2 blocker or using antacids regularly if symptoms worsen. Anxiety likely provoking as well. Continue anxiolytic. Low suspicion for aortic dissection accounting for discomfort. This has remained stable for several years. Risk factors are well-controlled.

## 2012-10-23 NOTE — Assessment & Plan Note (Signed)
Suspect manifestation of anxiety. This has been noted to be sinus tachycardia on event recorder. NSR today. Continue propanolol PRN.

## 2012-10-30 ENCOUNTER — Encounter: Payer: Self-pay | Admitting: Internal Medicine

## 2012-10-30 ENCOUNTER — Other Ambulatory Visit: Payer: Self-pay

## 2012-10-30 ENCOUNTER — Ambulatory Visit (INDEPENDENT_AMBULATORY_CARE_PROVIDER_SITE_OTHER): Payer: BC Managed Care – PPO | Admitting: *Deleted

## 2012-10-30 DIAGNOSIS — Z950 Presence of cardiac pacemaker: Secondary | ICD-10-CM

## 2012-10-30 DIAGNOSIS — I441 Atrioventricular block, second degree: Secondary | ICD-10-CM

## 2012-11-06 ENCOUNTER — Telehealth: Payer: Self-pay

## 2012-11-06 NOTE — Telephone Encounter (Signed)
Which do you prefer?

## 2012-11-06 NOTE — Telephone Encounter (Signed)
Pt states she was to call back to see "what kind of tests Dr. Mariah Milling wanted her to have" (TEE or Cardiac CT per last encounter). Please call.

## 2012-11-08 NOTE — Telephone Encounter (Signed)
Still reviewing all of her old films, echoes in various places When is her wedding done?

## 2012-11-09 ENCOUNTER — Telehealth: Payer: Self-pay

## 2012-11-09 NOTE — Telephone Encounter (Signed)
Pt says wedding is over and will wait on your decision

## 2012-11-09 NOTE — Telephone Encounter (Signed)
Pt is returning you phone call.

## 2012-11-09 NOTE — Telephone Encounter (Signed)
LMTCB

## 2012-11-12 LAB — REMOTE PACEMAKER DEVICE
AL THRESHOLD: 0.375 V
ATRIAL PACING PM: 3
RV LEAD THRESHOLD: 1.125 V

## 2012-11-14 NOTE — Telephone Encounter (Signed)
Can you let me know the days Wendy Kelly is in the office I need to have her help me look at her aortic valve She recently had an echo, need a short look again

## 2012-11-16 NOTE — Telephone Encounter (Signed)
We looked at her aortic root, a sitting aorta, aortic valve on ultrasound again Nov 15, 2012 (limited study) Unable to exclude bicuspid aortic valve again Artifact seen in the aortic root and descending aorta, not dissection Descending aorta measured at 5.6 cm No significant change from prior studies  We'll repeat echocardiogram next year

## 2012-11-17 NOTE — Telephone Encounter (Signed)
Pt informed Understanding verb 

## 2012-12-07 ENCOUNTER — Encounter: Payer: Self-pay | Admitting: *Deleted

## 2013-02-05 ENCOUNTER — Encounter: Payer: Self-pay | Admitting: Internal Medicine

## 2013-02-05 ENCOUNTER — Ambulatory Visit (INDEPENDENT_AMBULATORY_CARE_PROVIDER_SITE_OTHER): Payer: BC Managed Care – PPO | Admitting: *Deleted

## 2013-02-05 DIAGNOSIS — I441 Atrioventricular block, second degree: Secondary | ICD-10-CM

## 2013-02-05 DIAGNOSIS — Z950 Presence of cardiac pacemaker: Secondary | ICD-10-CM

## 2013-02-13 LAB — REMOTE PACEMAKER DEVICE
AL AMPLITUDE: 2.8 mv
AL IMPEDENCE PM: 534 Ohm
BATTERY VOLTAGE: 2.73 V
RV LEAD AMPLITUDE: 22.4 mv
VENTRICULAR PACING PM: 0

## 2013-02-21 ENCOUNTER — Ambulatory Visit: Payer: Self-pay | Admitting: Family Medicine

## 2013-03-07 ENCOUNTER — Encounter: Payer: Self-pay | Admitting: *Deleted

## 2013-04-24 ENCOUNTER — Ambulatory Visit (INDEPENDENT_AMBULATORY_CARE_PROVIDER_SITE_OTHER): Payer: BC Managed Care – PPO | Admitting: Internal Medicine

## 2013-04-24 ENCOUNTER — Ambulatory Visit (INDEPENDENT_AMBULATORY_CARE_PROVIDER_SITE_OTHER): Payer: BC Managed Care – PPO | Admitting: Cardiovascular Disease

## 2013-04-24 ENCOUNTER — Encounter: Payer: BC Managed Care – PPO | Admitting: Internal Medicine

## 2013-04-24 ENCOUNTER — Encounter: Payer: Self-pay | Admitting: Cardiovascular Disease

## 2013-04-24 ENCOUNTER — Encounter: Payer: Self-pay | Admitting: Internal Medicine

## 2013-04-24 VITALS — BP 110/82 | HR 69 | Ht 64.0 in | Wt 175.0 lb

## 2013-04-24 VITALS — BP 110/82 | HR 69 | Ht 64.0 in | Wt 175.2 lb

## 2013-04-24 DIAGNOSIS — R55 Syncope and collapse: Secondary | ICD-10-CM

## 2013-04-24 DIAGNOSIS — R079 Chest pain, unspecified: Secondary | ICD-10-CM

## 2013-04-24 DIAGNOSIS — Z950 Presence of cardiac pacemaker: Secondary | ICD-10-CM

## 2013-04-24 DIAGNOSIS — I441 Atrioventricular block, second degree: Secondary | ICD-10-CM

## 2013-04-24 DIAGNOSIS — I7121 Aneurysm of the ascending aorta, without rupture: Secondary | ICD-10-CM

## 2013-04-24 DIAGNOSIS — R002 Palpitations: Secondary | ICD-10-CM

## 2013-04-24 DIAGNOSIS — I712 Thoracic aortic aneurysm, without rupture: Secondary | ICD-10-CM

## 2013-04-24 LAB — PACEMAKER DEVICE OBSERVATION
AL IMPEDENCE PM: 479 Ohm
AL THRESHOLD: 0.5 V
ATRIAL PACING PM: 5
RV LEAD IMPEDENCE PM: 715 Ohm

## 2013-04-24 NOTE — Assessment & Plan Note (Signed)
She also has symptoms associated with ventricular threshold testing so we have reprogrammed her device to a--D.

## 2013-04-24 NOTE — Assessment & Plan Note (Signed)
Recent echocardiogram showing minimal progression, ascending aorta now 4.8 cm. We'll continue to monitor with periodic echo

## 2013-04-24 NOTE — Patient Instructions (Signed)

## 2013-04-24 NOTE — Assessment & Plan Note (Signed)
The patient's device was interrogated and the information was fully reviewed.  The device was reprogrammed As above  

## 2013-04-24 NOTE — Progress Notes (Signed)
Patient ID: Wendy Kelly, female    DOB: Oct 12, 1955, 57 y.o.   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 She presents for routine followup  Recent echocardiogram showing dilated aortic root 4.8 cm,  suspected bicuspid aortic valve. Aortic root is 4.1 cm. This is essentially unchanged from prior echocardiogram. Minimal change over the past several years, possibly up from 4.6 cm now to 4.8 cm over 5 years.  Denies any significant chest pain. She does have occasional palpitations, most often at nighttime. She is waking up sometimes from anxiety and things on her mind, notices her pulse all over her body.  She has had low potassium in the past on visits to the emergency room for chest pain   Last stress test was April 2013 performed for chest pain. Equivocal study, no large regions of ischemia, some attenuation artifact noted.  episode of tachycardia followed by chest pain 05/18/2012. Workup in the hospital was essentially negative. She had CT scan of the chest showing stable 4.7 cm descending aorta aneurysm. No mention of coronary artery disease. She had workup including carotid ultrasound that showed no significant disease.  Echocardiogram had been done in September 2012 prior to this episode and bubble study was not performed.  Recent lab work shows total cholesterol 151, LDL 84, HDL 55, TSH 1.8 EKG shows normal sinus rhythm with rate 69 beats per minute with no significant ST or T wave changes   Outpatient Encounter Prescriptions as of 04/24/2013  Medication Sig Dispense Refill   . Ascorbic Acid (VITAMIN C PO) Take by mouth daily.      Marland Kitchen aspirin 81 MG EC tablet Take 81 mg by mouth daily.        . B Complex Vitamins (VITAMIN B COMPLEX PO) Take by mouth daily.      Marland Kitchen CALCIUM-VITAMIN D PO Take by mouth daily.      . fluticasone (FLONASE) 50 MCG/ACT nasal spray Place 1 spray into the nose daily.       Marland Kitchen ibuprofen (ADVIL,MOTRIN) 200 MG tablet Takes 2-3 tablets as needed.      . Loratadine (CLARITIN PO) Take by mouth daily.      Marland Kitchen LORazepam (ATIVAN) 1 MG tablet 0.5 mg as needed.      . nitroGLYCERIN (NITROSTAT) 0.4 MG SL tablet Place 1 tablet (0.4 mg total) under the tongue every 5 (five) minutes as needed for chest pain.  25 tablet  6  . Nutritional Supplements (DHEA PO) Take 5 mg by mouth. Take two tablets in the am      . omeprazole (PRILOSEC) 20 MG capsule Take 20 mg by mouth as needed.       . progesterone (PROMETRIUM) 100 MG capsule Take 100 mg by mouth daily.       . propranolol (INDERAL) 20 MG tablet Take 1 tablet (20 mg total) by mouth 3 (three) times daily as needed.  60 tablet  3  . [DISCONTINUED] estradiol (ESTRACE) 0.1 MG/GM vaginal cream 2 g as needed.       . [DISCONTINUED] estradiol (CLIMARA - DOSED IN MG/24 HR) 0.025 mg/24hr Place 1 patch onto the  skin.       . [DISCONTINUED] Multiple Vitamin (MULTIVITAMIN) tablet Take 2 tablets by mouth daily.        No facility-administered encounter medications on file as of 04/24/2013.     Review of Systems  Constitutional: Negative.   HENT: Negative.   Eyes: Negative.   Respiratory: Negative.   Cardiovascular: Positive for palpitations.  Gastrointestinal: Negative.   Endocrine: Negative.   Musculoskeletal: Negative.   Skin: Negative.   Allergic/Immunologic: Negative.   Neurological: Negative.   Hematological: Negative.   Psychiatric/Behavioral: Negative.   All other systems reviewed and are negative.   BP 110/82  Pulse 69  Ht 5\' 4"  (1.626 m)  Wt 175 lb 4 oz (79.493 kg)  BMI 30.07 kg/m2  Physical  Exam  Nursing note and vitals reviewed. Constitutional: She is oriented to person, place, and time. She appears well-developed and well-nourished.  HENT:  Head: Normocephalic.  Nose: Nose normal.  Mouth/Throat: Oropharynx is clear and moist.  Eyes: Conjunctivae are normal. Pupils are equal, round, and reactive to light.  Neck: Normal range of motion. Neck supple. No JVD present.  Cardiovascular: Normal rate, regular rhythm, S1 normal, S2 normal, normal heart sounds and intact distal pulses.  Exam reveals no gallop and no friction rub.   No murmur heard. Pulmonary/Chest: Effort normal and breath sounds normal. No respiratory distress. She has no wheezes. She has no rales. She exhibits no tenderness.  Abdominal: Soft. Bowel sounds are normal. She exhibits no distension. There is no tenderness.  Musculoskeletal: Normal range of motion. She exhibits no edema and no tenderness.  Lymphadenopathy:    She has no cervical adenopathy.  Neurological: She is alert and oriented to person, place, and time. Coordination normal.  Skin: Skin is warm and dry. No rash noted. No erythema.  Psychiatric: She has a normal mood and affect. Her behavior is normal. Judgment and thought content normal.    Assessment and Plan

## 2013-04-24 NOTE — Patient Instructions (Signed)
You are doing well. No medication changes were made.  Please call us if you have new issues that need to be addressed before your next appt.  Your physician wants you to follow-up in: 12 months.  You will receive a reminder letter in the mail two months in advance. If you don't receive a letter, please call our office to schedule the follow-up appointment. 

## 2013-04-24 NOTE — Assessment & Plan Note (Signed)
Pacemaker in place

## 2013-04-24 NOTE — Progress Notes (Signed)
skf      Patient Care Team: Vonita Moss, MD as PCP - General (Unknown Physician Specialty)   HPI  Wendy Kelly is a 57 y.o. 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 She received a Medtronic adapa device in 2006    She has a history of normal left ventricular function by recent echo.   She has some nocturnal palpitations. There was some sensation of fluttering in her throat associated with ventricular threshold testing similar to symptoms that she has during the day    Past Medical History  Diagnosis Date  . AV block, 2nd degree     a. 05/2005 - s/p MDT Adapta ADDR01 Dual Chamber PPM  . Syncope     a. 04/2011 Echo: EF 55-65%, No RWMA, Gr 1 DD.  . AA (aortic aneurysm)     a. 04/2011 Echo: Ao Root: 4.1cm, Asc Ao 4.7cm.  . Depression   . Fatigue   . Expressive aphasia     a. ongoing since 04/2011 - seen by neurology - ? TIA vs. Migraine  . Facial numbness     a. ongoing since 04/2011 - seen by neurology - ? TIA vs. Migraine  . Anxiety   . Chest pain     a. 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    Past Surgical History  Procedure Laterality Date  . Pacemaker insertion  2006    Medtronic  . Cataract  2004  . Tonsillectomy    . Bladder tack    . Cholecystectomy  2000  . Vaginal hysterectomy    . Abdominal wall mesh  removal      Current Outpatient Prescriptions  Medication Sig Dispense Refill  . Ascorbic Acid (VITAMIN C PO) Take by mouth daily.      Marland Kitchen aspirin 81 MG EC tablet Take 81 mg by mouth daily.        . B Complex Vitamins (VITAMIN B COMPLEX PO) Take by mouth daily.      Marland Kitchen CALCIUM-VITAMIN D PO Take by mouth daily.      . fluticasone (FLONASE) 50 MCG/ACT nasal spray Place 1 spray into the nose daily.       Marland Kitchen ibuprofen (ADVIL,MOTRIN) 200 MG tablet Takes 2-3 tablets as needed.      . Loratadine (CLARITIN PO) Take by mouth daily.      Marland Kitchen LORazepam (ATIVAN) 1 MG tablet 0.5 mg as needed.      . nitroGLYCERIN (NITROSTAT)  0.4 MG SL tablet Place 1 tablet (0.4 mg total) under the tongue every 5 (five) minutes as needed for chest pain.  25 tablet  6  . Nutritional Supplements (DHEA PO) Take 5 mg by mouth. Take two tablets in the am      . omeprazole (PRILOSEC) 20 MG capsule Take 20 mg by mouth as needed.       . progesterone (PROMETRIUM) 100 MG capsule Take 100 mg by mouth daily.       . propranolol (INDERAL) 20 MG tablet Take 1 tablet (20 mg total) by mouth 3 (three) times daily as needed.  60 tablet  3   No current facility-administered medications for this visit.    Allergies  Allergen Reactions  . Cefdinir     unknown   . Codeine   . Entex Lq [Phenylephrine-Guaifenesin]   . Guaifenesin & Derivatives   . Naproxen Sodium     Itching/rash  . Oxycodone   . Penicillins     Rash/hives  .  Sulfonamide Derivatives     Rash/itching    Review of Systems negative except from HPI and PMH  Physical Exam BP 110/82  Pulse 69  Ht 5\' 4"  (1.626 m)  Wt 175 lb (79.379 kg)  BMI 30.02 kg/m2 Well developed and well nourished in no acute distress HENT normal E scleral and icterus clear Neck Supple JVP flat; carotids brisk and full Clear to ausculation Device pocket well healed; without hematoma or erythema.  There is no tethering Regular rate and rhythm, no murmurs gallops or rub Soft with active bowel sounds No clubbing cyanosis no Edema Alert and oriented, grossly normal motor and sensory function Skin Warm and Dry    Assessment and  Plan

## 2013-04-24 NOTE — Assessment & Plan Note (Signed)
Followed by Dr. Graciela Husbands. Unable to exclude palpitations at night as paced beats. She will discuss this with Dr. Graciela Husbands.

## 2013-04-24 NOTE — Assessment & Plan Note (Signed)
No recurrent syncope 

## 2013-04-24 NOTE — Assessment & Plan Note (Signed)
Prior noncardiac chest pain. Numerous trips to the emergency room in the past. No recent chest pain symptoms. Stress has improved at home.

## 2013-05-03 ENCOUNTER — Encounter: Payer: BC Managed Care – PPO | Admitting: Internal Medicine

## 2013-05-24 ENCOUNTER — Ambulatory Visit (INDEPENDENT_AMBULATORY_CARE_PROVIDER_SITE_OTHER): Payer: BC Managed Care – PPO | Admitting: Cardiovascular Disease

## 2013-05-24 ENCOUNTER — Encounter (INDEPENDENT_AMBULATORY_CARE_PROVIDER_SITE_OTHER): Payer: Self-pay

## 2013-05-24 ENCOUNTER — Encounter: Payer: Self-pay | Admitting: Cardiovascular Disease

## 2013-05-24 VITALS — BP 120/86 | HR 75 | Ht 64.0 in | Wt 177.2 lb

## 2013-05-24 DIAGNOSIS — R5381 Other malaise: Secondary | ICD-10-CM

## 2013-05-24 DIAGNOSIS — R0602 Shortness of breath: Secondary | ICD-10-CM

## 2013-05-24 DIAGNOSIS — R5383 Other fatigue: Secondary | ICD-10-CM

## 2013-05-24 DIAGNOSIS — R079 Chest pain, unspecified: Secondary | ICD-10-CM

## 2013-05-24 DIAGNOSIS — I712 Thoracic aortic aneurysm, without rupture: Secondary | ICD-10-CM

## 2013-05-24 MED ORDER — NITROGLYCERIN 0.4 MG SL SUBL: 0.4000 mg | SUBLINGUAL_TABLET | SUBLINGUAL | Status: DC | PRN

## 2013-05-24 NOTE — Assessment & Plan Note (Signed)
She does report one episode of low blood pressure. Etiology is not clear. Suggested she continue to monitor her blood pressure. Numbers are well controlled today, not low or high. No changes made to her medications

## 2013-05-24 NOTE — Assessment & Plan Note (Signed)
Stable in size over many years. We'll repeat echocardiogram next year

## 2013-05-24 NOTE — Assessment & Plan Note (Signed)
Fatigue likely coming from poor sleep hygiene. Encouraged her to try melatonin or Benadryl

## 2013-05-24 NOTE — Assessment & Plan Note (Signed)
Chest pain is atypical in nature. We have discussed this with her. Not associated with exertion. She has had significant anxiety over the recent events with her job, refusing the flu shot, almost losing her job. This was only resolved recently. She's waking up at nighttime with  a multitude of symptoms. Encouraged her to continue her workout plan. She needs better sleep.

## 2013-05-24 NOTE — Patient Instructions (Signed)
You are doing well.  Try advil/PM, benadryl and melatonin for sleep Need to get you some sleep Ok to take 1/2 or full NTG for chest pains  If no relief, call the office  For low pressures, salt load, fluids, TED hose For high pressures, 1/2 or full NTG  Please call us if you have new issues that need to be addressed before your next appt.  Your physician wants you to follow-up in: 6 months.  You will receive a reminder letter in the mail two months in advance. If you don't receive a letter, please call our office to schedule the follow-up appointment.

## 2013-05-24 NOTE — Progress Notes (Signed)
Patient ID: Wendy Kelly, female    DOB: 10-Jul-1956, 57 y.o.   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 She presents for routine followup  Recent echocardiogram showing dilated ascending aorta,   suspected bicuspid aortic valve. Mildly dilated Aortic root  This is essentially unchanged from prior echocardiogram. Minimal change over the past several years, possibly up from 4.6 cm now to 4.8 cm over 5 years.  Last CT scan in November 2013. We'll 0.7 cm aorta at that time  She has had low potassium in the past on visits to the emergency room for chest pain  Last stress test was April 2013 performed for chest pain. Equivocal study, no large regions of ischemia, some attenuation artifact noted.  episode of tachycardia followed by chest pain 05/18/2012. Workup in the hospital was essentially negative. She had workup including carotid ultrasound that showed no significant disease.   And presentation today, she reports having profound fatigue for the past several weeks, periods of hypotension, episodes of chest pain it wakes her up from sleep, pain radiating into her arm, waking up feeling freezing cold and on that event had hypertension, noises in her ears ("swishing"), also with shortness of breath. She has significant stress recently. She did not want a flu shot and almost lost her job before primary care wrote a letter on her behalf. That was resolved 2 weeks ago. She continues to have poor sleep at nighttime, feels like sleeping most of the day. Chest pain is a sharp, stabbing,  sometimes dull ache. Does not have any symptoms with exertion, always at nighttime  Previous lab work shows total cholesterol 151, LDL 84, HDL 55, TSH 1.8 EKG shows normal sinus rhythm with rate 75 beats per minute with no significant ST or T wave changes   Outpatient Encounter Prescriptions as of 05/24/2013  Medication Sig  . Ascorbic Acid (VITAMIN C PO) Take by mouth daily.  Marland Kitchen aspirin 81 MG EC tablet Take 81 mg by mouth daily.    . B Complex Vitamins (VITAMIN B COMPLEX PO) Take by mouth daily.  Marland Kitchen CALCIUM-VITAMIN D PO Take by mouth daily.  . fluticasone (FLONASE) 50 MCG/ACT nasal spray Place 1 spray into the nose daily.   Marland Kitchen ibuprofen (ADVIL,MOTRIN) 200 MG tablet Takes 2-3 tablets as needed.  . Loratadine (CLARITIN PO) Take by mouth daily.  Marland Kitchen LORazepam (ATIVAN) 1 MG tablet 0.5 mg as needed.  . Magnesium 200 MG TABS Take 200 mg by mouth daily.  . nitroGLYCERIN (NITROSTAT) 0.4 MG SL tablet Place 1 tablet (0.4 mg total) under the tongue every 5 (five) minutes as needed for chest pain.  . Nutritional Supplements (DHEA PO) Take 5 mg by mouth. Take two tablets in the am  . omeprazole (PRILOSEC) 20 MG capsule Take 20 mg by mouth as needed.   . progesterone (PROMETRIUM) 100 MG capsule Take 100 mg by mouth daily.   . propranolol (INDERAL) 20 MG tablet Take 1 tablet (20 mg total) by mouth 3 (three) times daily as needed.     Review of Systems  Constitutional: Negative.   HENT: Negative.   Eyes:  Negative.   Respiratory: Positive for chest tightness and shortness of breath.   Cardiovascular: Positive for chest pain.  Gastrointestinal: Negative.   Endocrine: Negative.   Musculoskeletal: Negative.   Skin: Negative.   Allergic/Immunologic: Negative.   Neurological: Negative.   Hematological: Negative.   Psychiatric/Behavioral: Negative.   All other systems reviewed and are negative.   BP 120/86  Pulse 75  Ht 5\' 4"  (1.626 m)  Wt 177 lb 4 oz (80.4 kg)  BMI 30.41 kg/m2  Physical Exam   Nursing note and vitals reviewed. Constitutional: She is oriented to person, place, and time. She appears well-developed and well-nourished.  HENT:  Head: Normocephalic.  Nose: Nose normal.  Mouth/Throat: Oropharynx is clear and moist.  Eyes: Conjunctivae are normal. Pupils are equal, round, and reactive to light.  Neck: Normal range of motion. Neck supple. No JVD present.  Cardiovascular: Normal rate, regular rhythm, S1 normal, S2 normal, normal heart sounds and intact distal pulses.  Exam reveals no gallop and no friction rub.   No murmur heard. Pulmonary/Chest: Effort normal and breath sounds normal. No respiratory distress. She has no wheezes. She has no rales. She exhibits no tenderness.  Abdominal: Soft. Bowel sounds are normal. She exhibits no distension. There is no tenderness.  Musculoskeletal: Normal range of motion. She exhibits no edema and no tenderness.  Lymphadenopathy:    She has no cervical adenopathy.  Neurological: She is alert and oriented to person, place, and time. Coordination normal.  Skin: Skin is warm and dry. No rash noted. No erythema.  Psychiatric: She has a normal mood and affect. Her behavior is normal. Judgment and thought content normal.    Assessment and Plan

## 2013-07-25 ENCOUNTER — Encounter: Payer: BC Managed Care – PPO | Admitting: *Deleted

## 2013-07-25 ENCOUNTER — Encounter: Payer: Self-pay | Admitting: Internal Medicine

## 2013-07-25 DIAGNOSIS — I441 Atrioventricular block, second degree: Secondary | ICD-10-CM

## 2013-07-25 DIAGNOSIS — Z950 Presence of cardiac pacemaker: Secondary | ICD-10-CM

## 2013-07-25 DIAGNOSIS — R55 Syncope and collapse: Secondary | ICD-10-CM

## 2013-07-25 LAB — MDC_IDC_ENUM_SESS_TYPE_REMOTE
Brady Statistic AP VS Percent: 2.6 %
Lead Channel Impedance Value: 662 Ohm
Lead Channel Pacing Threshold Amplitude: 0.375 V
Lead Channel Pacing Threshold Amplitude: 0.875 V
Lead Channel Pacing Threshold Pulse Width: 0.4 ms
Lead Channel Pacing Threshold Pulse Width: 0.4 ms
Lead Channel Sensing Intrinsic Amplitude: 2.8 mV
Lead Channel Setting Sensing Sensitivity: 4 mV
MDC IDC MSMT BATTERY IMPEDANCE: 2268 Ohm
MDC IDC MSMT BATTERY VOLTAGE: 2.73 V
MDC IDC MSMT LEADCHNL RA IMPEDANCE VALUE: 491 Ohm
MDC IDC MSMT LEADCHNL RV SENSING INTR AMPL: 8 mV
MDC IDC SET LEADCHNL RA PACING AMPLITUDE: 1.5 V
MDC IDC SET LEADCHNL RV PACING AMPLITUDE: 2 V
MDC IDC SET LEADCHNL RV PACING PULSEWIDTH: 0.4 ms
MDC IDC STAT BRADY AP VP PERCENT: 0.1 % — AB
MDC IDC STAT BRADY AS VP PERCENT: 0.3 %
MDC IDC STAT BRADY AS VS PERCENT: 97.1 %

## 2013-08-15 ENCOUNTER — Encounter: Payer: Self-pay | Admitting: *Deleted

## 2013-09-13 ENCOUNTER — Ambulatory Visit (INDEPENDENT_AMBULATORY_CARE_PROVIDER_SITE_OTHER): Payer: BC Managed Care – PPO | Admitting: Cardiovascular Disease

## 2013-09-13 ENCOUNTER — Encounter: Payer: Self-pay | Admitting: Cardiovascular Disease

## 2013-09-13 ENCOUNTER — Telehealth: Payer: Self-pay

## 2013-09-13 VITALS — BP 110/80 | HR 75 | Ht 64.0 in | Wt 181.0 lb

## 2013-09-13 DIAGNOSIS — I712 Thoracic aortic aneurysm, without rupture, unspecified: Secondary | ICD-10-CM

## 2013-09-13 DIAGNOSIS — R5383 Other fatigue: Secondary | ICD-10-CM

## 2013-09-13 DIAGNOSIS — R5381 Other malaise: Secondary | ICD-10-CM | POA: Insufficient documentation

## 2013-09-13 DIAGNOSIS — IMO0001 Reserved for inherently not codable concepts without codable children: Secondary | ICD-10-CM

## 2013-09-13 DIAGNOSIS — I7121 Aneurysm of the ascending aorta, without rupture: Secondary | ICD-10-CM

## 2013-09-13 DIAGNOSIS — R030 Elevated blood-pressure reading, without diagnosis of hypertension: Secondary | ICD-10-CM

## 2013-09-13 DIAGNOSIS — R002 Palpitations: Secondary | ICD-10-CM

## 2013-09-13 NOTE — Assessment & Plan Note (Signed)
Symptoms last night concerning for GI etiology. She has nausea, chills, malaise, brief period of hypertension. Currently feels better. No further testing at this time

## 2013-09-13 NOTE — Telephone Encounter (Signed)
Spoke w/ pt.  She states that she was so worried about her BP this am that she did not mention her HA and constant burping.  Asked pt if she could come in at 10:00 to let Dr. Rockey Situ take a look at her. She is agreeable to this.

## 2013-09-13 NOTE — Assessment & Plan Note (Signed)
Episode of GI distress last night with associated symptoms, likely from mild gastroenteritis. Currently feels better, although her vital stable, EKG unchanged. Consistent with GI source. Suggested she monitor her symptoms for now. No further testing

## 2013-09-13 NOTE — Progress Notes (Signed)
Patient ID: Wendy Kelly, female    DOB: 01/19/1956, 58 y.o.   MRN: [id number]  HPI Comments: Wendy Kelly is a very pleasant 58 year old woman with a history of a ascending aorta aneurysm estimated at 4.7 to 4.8 cm that has been monitored with annual echocardiography, history of second-degree AV block and pacemaker placement in November 2006, history of syncope, with  episode of TIA-type symptoms with right facial droop, expressive aphasia that resolved, also with periods of intermittent chest pain, occasional nausea, anxiety. She presents for routine followup  Recent echocardiogram showing dilated ascending aorta,   suspected bicuspid aortic valve. Mildly dilated Aortic root  This is essentially unchanged from prior echocardiogram. Minimal change over the past several years, possibly up from 4.6 cm now to 4.8 cm over 5 years.  Last CT scan in November 2013. We'll 0.7 cm aorta at that time  She has had low potassium in the past on visits to the emergency room for chest pain  Last stress test was April 2013 performed for chest pain. Equivocal study, no large regions of ischemia, some attenuation artifact noted.  episode of tachycardia followed by chest pain 05/18/2012. Workup in the hospital was essentially negative. She had workup including carotid ultrasound that showed no significant disease.   Presentation today she reports having headache at 10 PM last night, Tylenol, woke up at 1 AM with a pounding heart rate, blood pressure initially okay with systolic 510. She felt cold, nauseous, stomach upset. She felt off-balance. Blood pressure increased up to 258 systolic. She had burping symptoms, bowel movement urgency but no trips to the bathroom. She had a poor sleep overall. This morning she is feeling better, blood pressure has returned to normal. She's worried about her high blood pressure last night. Wonders if she should be taking a medication to make it better or if it is a sign of something  bad. She denies having anything stressful and symptoms concerning for panic attack. She did eat out  at a restaurant last night  Previous lab work shows total cholesterol 151, LDL 84, HDL 55, TSH 1.8 EKG shows normal sinus rhythm with rate 75 beats per minute with no significant ST or T wave changes   Outpatient Encounter Prescriptions as of 09/13/2013  Medication Sig  . Ascorbic Acid (VITAMIN C PO) Take by mouth daily.  Marland Kitchen aspirin 81 MG EC tablet Take 81 mg by mouth daily.    . B Complex Vitamins (VITAMIN B COMPLEX PO) Take by mouth daily.  Marland Kitchen CALCIUM-VITAMIN D PO Take by mouth daily.  . fluticasone (FLONASE) 50 MCG/ACT nasal spray Place 1 spray into the nose daily.   Marland Kitchen ibuprofen (ADVIL,MOTRIN) 200 MG tablet Takes 2-3 tablets as needed.  . Loratadine (CLARITIN PO) Take by mouth daily.  Marland Kitchen LORazepam (ATIVAN) 1 MG tablet 0.5 mg as needed.  . Magnesium 200 MG TABS Take 200 mg by mouth daily.  . nitroGLYCERIN (NITROSTAT) 0.4 MG SL tablet Place 1 tablet (0.4 mg total) under the tongue every 5 (five) minutes as needed for chest pain.  . Nutritional Supplements (DHEA PO) Take 5 mg by mouth. Take two tablets in the am  . omeprazole (PRILOSEC) 20 MG capsule Take 20 mg by mouth as needed.   . progesterone (PROMETRIUM) 100 MG capsule Take 100 mg by mouth daily.   . propranolol (INDERAL) 20 MG tablet Take 1 tablet (20 mg total) by mouth 3 (three) times daily as needed.    Review of Systems  Constitutional:  Negative.        She felt cold, weak, off-balance last night  HENT: Negative.   Eyes: Negative.   Cardiovascular: Negative.   Gastrointestinal: Positive for nausea.       Abdominal discomfort  Endocrine: Negative.   Musculoskeletal: Negative.   Skin: Negative.   Allergic/Immunologic: Negative.   Neurological: Negative.   Hematological: Negative.   Psychiatric/Behavioral: Negative.   All other systems reviewed and are negative.   BP 110/80  Pulse 75  Ht 5\' 4"  (1.626 m)  Wt 181 lb  (82.101 kg)  BMI 31.05 kg/m2  Physical Exam  Nursing note and vitals reviewed. Constitutional: She is oriented to person, place, and time. She appears well-developed and well-nourished.  HENT:  Head: Normocephalic.  Nose: Nose normal.  Mouth/Throat: Oropharynx is clear and moist.  Eyes: Conjunctivae are normal. Pupils are equal, round, and reactive to light.  Neck: Normal range of motion. Neck supple. No JVD present.  Cardiovascular: Normal rate, regular rhythm, S1 normal, S2 normal, normal heart sounds and intact distal pulses.  Exam reveals no gallop and no friction rub.   No murmur heard. Pulmonary/Chest: Effort normal and breath sounds normal. No respiratory distress. She has no wheezes. She has no rales. She exhibits no tenderness.  Abdominal: Soft. Bowel sounds are normal. She exhibits no distension. There is no tenderness.  Musculoskeletal: Normal range of motion. She exhibits no edema and no tenderness.  Lymphadenopathy:    She has no cervical adenopathy.  Neurological: She is alert and oriented to person, place, and time. Coordination normal.  Skin: Skin is warm and dry. No rash noted. No erythema.  Psychiatric: She has a normal mood and affect. Her behavior is normal. Judgment and thought content normal.    Assessment and Plan

## 2013-09-13 NOTE — Telephone Encounter (Signed)
Pt states she woke up at 1 am, states she felt like her heart was "thumping", states her BP was 168/103, no chest pain, states this a.m. It was 105/77. States she does not feel bad, also states she went to bed with a headache.

## 2013-09-13 NOTE — Patient Instructions (Signed)
You are doing well. No medication changes were made.  Please call us if you have new issues that need to be addressed before your next appt.  Your physician wants you to follow-up in: 6 months.  You will receive a reminder letter in the mail two months in advance. If you don't receive a letter, please call our office to schedule the follow-up appointment.   

## 2013-09-13 NOTE — Assessment & Plan Note (Addendum)
Echocardiogram scheduled for May 2015

## 2013-10-26 ENCOUNTER — Ambulatory Visit (INDEPENDENT_AMBULATORY_CARE_PROVIDER_SITE_OTHER): Payer: BC Managed Care – PPO | Admitting: *Deleted

## 2013-10-26 ENCOUNTER — Encounter: Payer: Self-pay | Admitting: Internal Medicine

## 2013-10-26 DIAGNOSIS — I441 Atrioventricular block, second degree: Secondary | ICD-10-CM

## 2013-10-26 LAB — MDC_IDC_ENUM_SESS_TYPE_REMOTE
Brady Statistic AP VS Percent: 3.1 %
Lead Channel Pacing Threshold Amplitude: 0.375 V
Lead Channel Pacing Threshold Amplitude: 0.875 V
Lead Channel Pacing Threshold Pulse Width: 0.4 ms
Lead Channel Sensing Intrinsic Amplitude: 8 mV
Lead Channel Setting Pacing Amplitude: 1.5 V
MDC IDC MSMT LEADCHNL RA IMPEDANCE VALUE: 580 Ohm
MDC IDC MSMT LEADCHNL RA SENSING INTR AMPL: 2.8 mV
MDC IDC MSMT LEADCHNL RV IMPEDANCE VALUE: 677 Ohm
MDC IDC MSMT LEADCHNL RV PACING THRESHOLD PULSEWIDTH: 0.4 ms
MDC IDC SET LEADCHNL RV PACING AMPLITUDE: 2 V
MDC IDC SET LEADCHNL RV PACING PULSEWIDTH: 0.4 ms
MDC IDC SET LEADCHNL RV SENSING SENSITIVITY: 4 mV
MDC IDC STAT BRADY AP VP PERCENT: 0.1 % — AB
MDC IDC STAT BRADY AS VP PERCENT: 0.3 %
MDC IDC STAT BRADY AS VS PERCENT: 96.6 %

## 2013-11-14 ENCOUNTER — Encounter: Payer: Self-pay | Admitting: Cardiology

## 2013-11-15 ENCOUNTER — Other Ambulatory Visit (HOSPITAL_COMMUNITY): Payer: Self-pay | Admitting: *Deleted

## 2013-11-15 DIAGNOSIS — I712 Thoracic aortic aneurysm, without rupture, unspecified: Secondary | ICD-10-CM

## 2013-11-20 ENCOUNTER — Other Ambulatory Visit (INDEPENDENT_AMBULATORY_CARE_PROVIDER_SITE_OTHER): Payer: BC Managed Care – PPO

## 2013-11-20 ENCOUNTER — Other Ambulatory Visit: Payer: Self-pay

## 2013-11-20 DIAGNOSIS — I712 Thoracic aortic aneurysm, without rupture, unspecified: Secondary | ICD-10-CM

## 2013-11-20 DIAGNOSIS — I359 Nonrheumatic aortic valve disorder, unspecified: Secondary | ICD-10-CM

## 2013-11-26 ENCOUNTER — Ambulatory Visit: Payer: Self-pay | Admitting: Gastroenterology

## 2013-11-29 ENCOUNTER — Encounter: Payer: Self-pay | Admitting: Cardiovascular Disease

## 2013-11-29 ENCOUNTER — Ambulatory Visit (INDEPENDENT_AMBULATORY_CARE_PROVIDER_SITE_OTHER): Payer: BC Managed Care – PPO | Admitting: Cardiovascular Disease

## 2013-11-29 VITALS — BP 108/72 | HR 68 | Ht 64.0 in | Wt 177.0 lb

## 2013-11-29 DIAGNOSIS — R079 Chest pain, unspecified: Secondary | ICD-10-CM

## 2013-11-29 DIAGNOSIS — I7121 Aneurysm of the ascending aorta, without rupture: Secondary | ICD-10-CM

## 2013-11-29 DIAGNOSIS — I712 Thoracic aortic aneurysm, without rupture, unspecified: Secondary | ICD-10-CM

## 2013-11-29 NOTE — Progress Notes (Signed)
Patient ID: Wendy Kelly, female    DOB: 10-20-1955, 58 y.o.   MRN: [id number]  HPI Comments: Wendy Kelly is a very pleasant 58 year old woman with a history of a ascending aorta aneurysm estimated at 4.7 to 4.8 cm that has been monitored with annual echocardiography, history of second-degree AV block and pacemaker placement in November 2006, history of syncope, with  episode of TIA-type symptoms with right facial droop, expressive aphasia that resolved, also with periods of intermittent chest pain, occasional nausea, anxiety. She presents for routine followup  Previous echocardiogram showing moderately dilated ascending aorta,   suspected bicuspid aortic valve. Mildly dilated Aortic root   Minimal change over the past several years, possibly up from 4.6 cm now to 4.8 cm over 5 years.  Recent echocardiogram showing no significant change. Results were discussed with her on today's visit In general she feels well with no complaints. She is working long hours as a Geophysicist/field seismologist  Last CT scan in November 2013.  4.7 cm ascending aorta at that time  She has had low potassium in the past on visits to the emergency room for chest pain  Last stress test was April 2013 performed for chest pain. Equivocal study, no large regions of ischemia, some attenuation artifact noted.  episode of tachycardia followed by chest pain 05/18/2012. Workup in the hospital was essentially negative. She had workup including carotid ultrasound that showed no significant disease.   Previous lab work shows total cholesterol 151, LDL 84, HDL 55, TSH 1.8   Outpatient Encounter Prescriptions as of 11/29/2013  Medication Sig  . Ascorbic Acid (VITAMIN C PO) Take by mouth daily.  Marland Kitchen aspirin 81 MG EC tablet Take 81 mg by mouth daily.    . B Complex Vitamins (VITAMIN B COMPLEX PO) Take by mouth daily.  Marland Kitchen CALCIUM-VITAMIN D PO Take by mouth daily.  . fluticasone (FLONASE) 50 MCG/ACT nasal spray Place 1 spray into the nose daily.    Marland Kitchen ibuprofen (ADVIL,MOTRIN) 200 MG tablet Takes 2-3 tablets as needed.  . Loratadine (CLARITIN PO) Take by mouth daily.  Marland Kitchen LORazepam (ATIVAN) 1 MG tablet 0.5 mg as needed.  . Magnesium 200 MG TABS Take 200 mg by mouth daily.  . nitroGLYCERIN (NITROSTAT) 0.4 MG SL tablet Place 1 tablet (0.4 mg total) under the tongue every 5 (five) minutes as needed for chest pain.  . Nutritional Supplements (DHEA PO) Take 5 mg by mouth. Take two tablets in the am  . omeprazole (PRILOSEC) 20 MG capsule Take 20 mg by mouth as needed.   . progesterone (PROMETRIUM) 100 MG capsule Take 100 mg by mouth daily.   . propranolol (INDERAL) 20 MG tablet Take 1 tablet (20 mg total) by mouth 3 (three) times daily as needed.  . triamcinolone (NASACORT ALLERGY 24HR) 55 MCG/ACT AERO nasal inhaler Place 1 spray into the nose daily.     Review of Systems  Constitutional: Negative.   HENT: Negative.   Eyes: Negative.   Gastrointestinal: Negative.   Endocrine: Negative.   Musculoskeletal: Negative.   Skin: Negative.   Allergic/Immunologic: Negative.   Neurological: Negative.   Hematological: Negative.   Psychiatric/Behavioral: Negative.   All other systems reviewed and are negative.  BP 108/72  Pulse 68  Ht 5\' 4"  (1.626 m)  Wt 177 lb (80.287 kg)  BMI 30.37 kg/m2  Physical Exam  Nursing note and vitals reviewed. Constitutional: She is oriented to person, place, and time. She appears well-developed and well-nourished.  HENT:  Head: Normocephalic.  Nose: Nose normal.  Mouth/Throat: Oropharynx is clear and moist.  Eyes: Conjunctivae are normal. Pupils are equal, round, and reactive to light.  Neck: Normal range of motion. Neck supple. No JVD present.  Cardiovascular: Normal rate, regular rhythm, S1 normal, S2 normal, normal heart sounds and intact distal pulses.  Exam reveals no gallop and no friction rub.   No murmur heard. Pulmonary/Chest: Effort normal and breath sounds normal. No respiratory distress. She  has no wheezes. She has no rales. She exhibits no tenderness.  Abdominal: Soft. Bowel sounds are normal. She exhibits no distension. There is no tenderness.  Musculoskeletal: Normal range of motion. She exhibits no edema and no tenderness.  Lymphadenopathy:    She has no cervical adenopathy.  Neurological: She is alert and oriented to person, place, and time. Coordination normal.  Skin: Skin is warm and dry. No rash noted. No erythema.  Psychiatric: She has a normal mood and affect. Her behavior is normal. Judgment and thought content normal.    Assessment and Plan

## 2013-11-29 NOTE — Assessment & Plan Note (Signed)
No significant change on recent echocardiogram. Repeat echocardiogram in one year 

## 2013-11-29 NOTE — Patient Instructions (Signed)
You are doing well. No medication changes were made.  Please call us if you have new issues that need to be addressed before your next appt.  Your physician wants you to follow-up in: 12 months.  You will receive a reminder letter in the mail two months in advance. If you don't receive a letter, please call our office to schedule the follow-up appointment. 

## 2013-11-29 NOTE — Assessment & Plan Note (Signed)
No recent episodes of chest pain. No further workup at this time 

## 2013-12-13 ENCOUNTER — Ambulatory Visit: Payer: Self-pay | Admitting: Gastroenterology

## 2013-12-14 LAB — PATHOLOGY REPORT

## 2014-01-29 ENCOUNTER — Ambulatory Visit (INDEPENDENT_AMBULATORY_CARE_PROVIDER_SITE_OTHER): Payer: BC Managed Care – PPO | Admitting: *Deleted

## 2014-01-29 DIAGNOSIS — I441 Atrioventricular block, second degree: Secondary | ICD-10-CM

## 2014-01-29 DIAGNOSIS — R55 Syncope and collapse: Secondary | ICD-10-CM

## 2014-01-29 NOTE — Progress Notes (Signed)
Remote pacemaker transmission.   

## 2014-02-04 LAB — MDC_IDC_ENUM_SESS_TYPE_REMOTE
Battery Impedance: 3069 Ohm
Battery Remaining Longevity: 17 mo
Brady Statistic AP VP Percent: 0 %
Brady Statistic AP VS Percent: 5 %
Brady Statistic AS VP Percent: 0 %
Brady Statistic AS VS Percent: 95 %
Lead Channel Impedance Value: 547 Ohm
Lead Channel Pacing Threshold Pulse Width: 0.4 ms
Lead Channel Sensing Intrinsic Amplitude: 2.8 mV
Lead Channel Sensing Intrinsic Amplitude: 22.4 mV
Lead Channel Setting Pacing Amplitude: 1.5 V
Lead Channel Setting Pacing Pulse Width: 0.4 ms
Lead Channel Setting Sensing Sensitivity: 4 mV
MDC IDC MSMT BATTERY VOLTAGE: 2.71 V
MDC IDC MSMT LEADCHNL RA PACING THRESHOLD AMPLITUDE: 0.375 V
MDC IDC MSMT LEADCHNL RA PACING THRESHOLD PULSEWIDTH: 0.4 ms
MDC IDC MSMT LEADCHNL RV IMPEDANCE VALUE: 670 Ohm
MDC IDC MSMT LEADCHNL RV PACING THRESHOLD AMPLITUDE: 0.625 V
MDC IDC SESS DTM: 20150721095420
MDC IDC SET LEADCHNL RV PACING AMPLITUDE: 2 V

## 2014-03-05 ENCOUNTER — Encounter: Payer: Self-pay | Admitting: Cardiology

## 2014-03-12 ENCOUNTER — Encounter: Payer: Self-pay | Admitting: Internal Medicine

## 2014-04-30 ENCOUNTER — Encounter: Payer: BC Managed Care – PPO | Admitting: Internal Medicine

## 2014-05-07 ENCOUNTER — Encounter: Payer: Self-pay | Admitting: Internal Medicine

## 2014-05-07 ENCOUNTER — Ambulatory Visit (INDEPENDENT_AMBULATORY_CARE_PROVIDER_SITE_OTHER): Payer: BC Managed Care – PPO | Admitting: Internal Medicine

## 2014-05-07 VITALS — BP 109/72 | HR 63 | Ht 64.0 in | Wt 177.2 lb

## 2014-05-07 DIAGNOSIS — I441 Atrioventricular block, second degree: Secondary | ICD-10-CM

## 2014-05-07 DIAGNOSIS — R55 Syncope and collapse: Secondary | ICD-10-CM

## 2014-05-07 DIAGNOSIS — Z45018 Encounter for adjustment and management of other part of cardiac pacemaker: Secondary | ICD-10-CM

## 2014-05-07 LAB — MDC_IDC_ENUM_SESS_TYPE_INCLINIC
Battery Impedance: 3269 Ohm
Battery Remaining Longevity: 16 mo
Battery Voltage: 2.7 V
Brady Statistic AP VS Percent: 5 %
Brady Statistic AS VP Percent: 0 %
Date Time Interrogation Session: 20151027091750
Lead Channel Impedance Value: 496 Ohm
Lead Channel Impedance Value: 716 Ohm
Lead Channel Pacing Threshold Amplitude: 0.5 V
Lead Channel Pacing Threshold Amplitude: 0.75 V
Lead Channel Pacing Threshold Pulse Width: 0.4 ms
Lead Channel Pacing Threshold Pulse Width: 0.4 ms
Lead Channel Setting Pacing Amplitude: 2 V
Lead Channel Setting Pacing Pulse Width: 0.4 ms
Lead Channel Setting Sensing Sensitivity: 4 mV
MDC IDC MSMT LEADCHNL RA SENSING INTR AMPL: 4 mV
MDC IDC MSMT LEADCHNL RV SENSING INTR AMPL: 11.2 mV
MDC IDC SET LEADCHNL RA PACING AMPLITUDE: 1.5 V
MDC IDC STAT BRADY AP VP PERCENT: 0 %
MDC IDC STAT BRADY AS VS PERCENT: 95 %

## 2014-05-07 NOTE — Progress Notes (Signed)
skf      Patient Care Team: Golden Pop, MD as PCP - General (Unknown Physician Specialty)   HPI  Wendy Kelly is a 58 y.o. female Seen in followup for a pacemaker implanted for syncope with intermittent second degree heart block. She received a Medtronic adapa device in 2006    She has a history of normal left ventricular function by recent echo.   The patient denies chest pain, shortness of breath, nocturnal dyspnea, orthopnea or peripheral edema.  There have been no palpitations, lightheadedness or syncope.    Past Medical History  Diagnosis Date  . AV block, 2nd degree     a. 05/2005 - s/p MDT Adapta ADDR01 Dual Chamber PPM  . Syncope     a. 04/2011 Echo: EF 55-65%, No RWMA, Gr 1 DD.  . AA (aortic aneurysm)     a. 04/2011 Echo: Ao Root: 4.1cm, Asc Ao 4.7cm.  . Depression   . Fatigue   . Expressive aphasia     a. ongoing since 04/2011 - seen by neurology - ? TIA vs. Migraine  . Facial numbness     a. ongoing since 04/2011 - seen by neurology - ? TIA vs. Migraine  . Anxiety   . Chest pain     a. 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    Past Surgical History  Procedure Laterality Date  . Pacemaker insertion  2006    Medtronic  . Cataract  2004  . Tonsillectomy    . Bladder tack    . Cholecystectomy  2000  . Vaginal hysterectomy    . Abdominal wall mesh  removal      Current Outpatient Prescriptions  Medication Sig Dispense Refill  . B Complex Vitamins (VITAMIN B COMPLEX PO) Take by mouth daily.      Marland Kitchen CALCIUM-VITAMIN D PO Take by mouth daily.      . Cyanocobalamin (VITAMIN B 12 PO) Take by mouth daily.      . fluticasone (FLONASE) 50 MCG/ACT nasal spray Place 1 spray into the nose daily.       Marland Kitchen ibuprofen (ADVIL,MOTRIN) 200 MG tablet Takes 2-3 tablets as needed.      . Loratadine (CLARITIN PO) Take by mouth daily.      Marland Kitchen LORazepam (ATIVAN) 1 MG tablet 0.5 mg as needed.      . Magnesium 200 MG TABS Take 200 mg by mouth daily.      . nitroGLYCERIN (NITROSTAT) 0.4 MG  SL tablet Place 1 tablet (0.4 mg total) under the tongue every 5 (five) minutes as needed for chest pain.  25 tablet  6  . Nutritional Supplements (DHEA PO) Take 5 mg by mouth. Take two tablets in the am      . omeprazole (PRILOSEC) 20 MG capsule Take 20 mg by mouth as needed.       . progesterone (PROMETRIUM) 100 MG capsule Take 100 mg by mouth daily.       . propranolol (INDERAL) 20 MG tablet Take 1 tablet (20 mg total) by mouth 3 (three) times daily as needed.  60 tablet  3  . triamcinolone (NASACORT ALLERGY 24HR) 55 MCG/ACT AERO nasal inhaler Place 1 spray into the nose daily.       No current facility-administered medications for this visit.    Allergies  Allergen Reactions  . Cefdinir     unknown   . Codeine   . Entex Lq [Phenylephrine-Guaifenesin]   . Flagyl [Metronidazole]   . Guaifenesin & Derivatives   .  Naproxen Sodium     Itching/rash  . Oxycodone   . Penicillins     Rash/hives  . Sulfonamide Derivatives     Rash/itching  . Tape Rash    Review of Systems negative except from HPI and PMH  Physical Exam BP 109/72  Pulse 63  Ht 5\' 4"  (1.626 m)  Wt 177 lb 4 oz (80.4 kg)  BMI 30.41 kg/m2 Well developed and well nourished in no acute distress HENT normal E scleral and icterus clear Neck Supple JVP flat; carotids brisk and full Clear to ausculation Device pocket well healed; without hematoma or erythema.  There is no tethering Regular rate and rhythm, no murmurs gallops or rub Soft with active bowel sounds No clubbing cyanosis no Edema Alert and oriented, grossly normal motor and sensory function Skin Warm and Dry  ECG demonstrates sinus rhythm at 63 Interval 17/09/38 Otherwise normal  Assessment and  Plan  Syncope  Intermittent second degree heart block-apparently nocturnal  Pacemaker-Medtronic  Her pacemaker is approaching ERI. She uses it infrequently. Do not have access to the primary data as to the indication, but it is her recollection of her  nocturnal pauses prompted implantation. Notably she has had no recurrent syncope and so is in no pauses or nocturnal it seems to have been affected in preventing recurrence of syncope and hence, at [facility], it is worth replacing.

## 2014-05-08 ENCOUNTER — Ambulatory Visit: Payer: Self-pay | Admitting: Family Medicine

## 2014-06-30 ENCOUNTER — Observation Stay (HOSPITAL_COMMUNITY)
Admission: EM | Admit: 2014-06-30 | Discharge: 2014-07-01 | Disposition: A | Discharge status: Home / Self Care | Payer: BC Managed Care – PPO | Attending: Family Medicine | Admitting: Family Medicine

## 2014-06-30 ENCOUNTER — Inpatient Hospital Stay (HOSPITAL_COMMUNITY): Payer: BC Managed Care – PPO

## 2014-06-30 ENCOUNTER — Emergency Department (HOSPITAL_COMMUNITY): Payer: BC Managed Care – PPO

## 2014-06-30 ENCOUNTER — Encounter (HOSPITAL_COMMUNITY): Payer: Self-pay | Admitting: Emergency Medicine

## 2014-06-30 DIAGNOSIS — I7121 Aneurysm of the ascending aorta, without rupture: Secondary | ICD-10-CM | POA: Diagnosis present

## 2014-06-30 DIAGNOSIS — T733XXA Exhaustion due to excessive exertion, initial encounter: Secondary | ICD-10-CM | POA: Diagnosis not present

## 2014-06-30 DIAGNOSIS — R079 Chest pain, unspecified: Secondary | ICD-10-CM | POA: Diagnosis not present

## 2014-06-30 DIAGNOSIS — R5383 Other fatigue: Secondary | ICD-10-CM | POA: Diagnosis not present

## 2014-06-30 DIAGNOSIS — R0789 Other chest pain: Secondary | ICD-10-CM | POA: Diagnosis not present

## 2014-06-30 DIAGNOSIS — I441 Atrioventricular block, second degree: Secondary | ICD-10-CM | POA: Diagnosis not present

## 2014-06-30 DIAGNOSIS — R5381 Other malaise: Secondary | ICD-10-CM | POA: Diagnosis present

## 2014-06-30 DIAGNOSIS — Z882 Allergy status to sulfonamides status: Secondary | ICD-10-CM | POA: Insufficient documentation

## 2014-06-30 DIAGNOSIS — F329 Major depressive disorder, single episode, unspecified: Secondary | ICD-10-CM | POA: Insufficient documentation

## 2014-06-30 DIAGNOSIS — Z79899 Other long term (current) drug therapy: Secondary | ICD-10-CM | POA: Insufficient documentation

## 2014-06-30 DIAGNOSIS — Z885 Allergy status to narcotic agent status: Secondary | ICD-10-CM | POA: Diagnosis not present

## 2014-06-30 DIAGNOSIS — Z883 Allergy status to other anti-infective agents status: Secondary | ICD-10-CM | POA: Insufficient documentation

## 2014-06-30 DIAGNOSIS — I712 Thoracic aortic aneurysm, without rupture: Secondary | ICD-10-CM | POA: Diagnosis not present

## 2014-06-30 DIAGNOSIS — Z888 Allergy status to other drugs, medicaments and biological substances status: Secondary | ICD-10-CM | POA: Insufficient documentation

## 2014-06-30 DIAGNOSIS — Z950 Presence of cardiac pacemaker: Secondary | ICD-10-CM | POA: Insufficient documentation

## 2014-06-30 DIAGNOSIS — F419 Anxiety disorder, unspecified: Secondary | ICD-10-CM | POA: Diagnosis not present

## 2014-06-30 DIAGNOSIS — Z880 Allergy status to penicillin: Secondary | ICD-10-CM | POA: Insufficient documentation

## 2014-06-30 DIAGNOSIS — Z881 Allergy status to other antibiotic agents status: Secondary | ICD-10-CM | POA: Diagnosis not present

## 2014-06-30 HISTORY — DX: Bicuspid aortic valve: Q23.81

## 2014-06-30 HISTORY — DX: Congenital insufficiency of aortic valve: Q23.1

## 2014-06-30 LAB — CBC WITH DIFFERENTIAL/PLATELET
Basophils Absolute: 0 10*3/uL (ref 0.0–0.1)
Basophils Relative: 1 % (ref 0–1)
EOS ABS: 0.1 10*3/uL (ref 0.0–0.7)
Eosinophils Relative: 2 % (ref 0–5)
HCT: 39.3 % (ref 36.0–46.0)
Hemoglobin: 13.4 g/dL (ref 12.0–15.0)
LYMPHS ABS: 1 10*3/uL (ref 0.7–4.0)
LYMPHS PCT: 17 % (ref 12–46)
MCH: 30 pg (ref 26.0–34.0)
MCHC: 34.1 g/dL (ref 30.0–36.0)
MCV: 88.1 fL (ref 78.0–100.0)
Monocytes Absolute: 0.4 10*3/uL (ref 0.1–1.0)
Monocytes Relative: 7 % (ref 3–12)
NEUTROS PCT: 74 % (ref 43–77)
Neutro Abs: 4.3 10*3/uL (ref 1.7–7.7)
PLATELETS: 173 10*3/uL (ref 150–400)
RBC: 4.46 MIL/uL (ref 3.87–5.11)
RDW: 12.9 % (ref 11.5–15.5)
WBC: 5.8 10*3/uL (ref 4.0–10.5)

## 2014-06-30 LAB — TROPONIN I: Troponin I: <0.3 ng/mL (ref <0.30)

## 2014-06-30 LAB — LIPID PANEL
CHOL/HDL RATIO: 2.8 ratio
CHOLESTEROL: 142 mg/dL (ref 0–200)
HDL: 50 mg/dL (ref >39)
LDL CALC: 74 mg/dL (ref 0–99)
Triglycerides: 88 mg/dL (ref <150)
VLDL: 18 mg/dL (ref 0–40)

## 2014-06-30 LAB — BASIC METABOLIC PANEL
Anion gap: 12 (ref 5–15)
BUN: 13 mg/dL (ref 6–23)
CALCIUM: 9.2 mg/dL (ref 8.4–10.5)
CO2: 26 meq/L (ref 19–32)
Chloride: 105 mEq/L (ref 96–112)
Creatinine, Ser: 0.74 mg/dL (ref 0.50–1.10)
GFR calc Af Amer: >90 mL/min (ref >90)
Glucose, Bld: 90 mg/dL (ref 70–99)
POTASSIUM: 3.6 meq/L — AB (ref 3.7–5.3)
SODIUM: 143 meq/L (ref 137–147)

## 2014-06-30 LAB — I-STAT TROPONIN, ED: TROPONIN I, POC: 0 ng/mL (ref 0.00–0.08)

## 2014-06-30 LAB — TSH: TSH: 3.37 u[IU]/mL (ref 0.350–4.500)

## 2014-06-30 MED ORDER — NITROGLYCERIN 0.4 MG SL SUBL
0.4000 mg | SUBLINGUAL_TABLET | SUBLINGUAL | Status: DC | PRN
  Administered 2014-07-01: 0.4 mg via SUBLINGUAL
  Filled 2014-06-30 (×2): qty 1

## 2014-06-30 MED ORDER — MORPHINE SULFATE 2 MG/ML IJ SOLN: 2.0000 mg | INTRAMUSCULAR | Status: DC | PRN

## 2014-06-30 MED ORDER — ONDANSETRON HCL 4 MG/2ML IJ SOLN
4.0000 mg | Freq: Three times a day (TID) | INTRAMUSCULAR | Status: DC | PRN
Start: 2014-06-30 — End: 2014-06-30

## 2014-06-30 MED ORDER — HEPARIN SODIUM (PORCINE) 5000 UNIT/ML IJ SOLN
5000.0000 [IU] | Freq: Three times a day (TID) | INTRAMUSCULAR | Status: DC
  Filled 2014-06-30 (×4): qty 1

## 2014-06-30 MED ORDER — ACETAMINOPHEN 325 MG PO TABS
650.0000 mg | ORAL_TABLET | ORAL | Status: DC | PRN
Start: 2014-06-30 — End: 2014-07-01
  Administered 2014-06-30 – 2014-07-01 (×2): 650 mg via ORAL
  Filled 2014-06-30 (×2): qty 2

## 2014-06-30 MED ORDER — PROGESTERONE MICRONIZED 100 MG PO CAPS
100.0000 mg | ORAL_CAPSULE | Freq: Every day | ORAL | Status: DC
  Administered 2014-06-30: 100 mg via ORAL
  Filled 2014-06-30 (×2): qty 1

## 2014-06-30 MED ORDER — PANTOPRAZOLE SODIUM 40 MG PO TBEC
40.0000 mg | DELAYED_RELEASE_TABLET | Freq: Every day | ORAL | Status: DC
  Administered 2014-07-01: 40 mg via ORAL
  Filled 2014-06-30: qty 1

## 2014-06-30 MED ORDER — ONDANSETRON HCL 4 MG/2ML IJ SOLN: 4.0000 mg | Freq: Four times a day (QID) | INTRAMUSCULAR | Status: DC | PRN

## 2014-06-30 MED ORDER — FLUTICASONE PROPIONATE 50 MCG/ACT NA SUSP
1.0000 | Freq: Every day | NASAL | Status: DC
  Filled 2014-06-30: qty 16

## 2014-06-30 MED ORDER — LORATADINE 10 MG PO TABS
10.0000 mg | ORAL_TABLET | Freq: Every day | ORAL | Status: DC
  Administered 2014-07-01: 10 mg via ORAL
  Filled 2014-06-30 (×2): qty 1

## 2014-06-30 MED ORDER — CALCIUM POLYCARBOPHIL 625 MG PO TABS
625.0000 mg | ORAL_TABLET | Freq: Every day | ORAL | Status: DC
  Administered 2014-06-30: 625 mg via ORAL
  Filled 2014-06-30 (×2): qty 1

## 2014-06-30 MED ORDER — IOHEXOL 350 MG/ML SOLN
80.0000 mL | Freq: Once | INTRAVENOUS | Status: AC | PRN
  Administered 2014-06-30: 80 mL via INTRAVENOUS

## 2014-06-30 NOTE — H&P (Signed)
[facility] Admission History and Physical Service Pager: [phone]  Patient name: Wendy Kelly Medical record number: [id number] Date of birth: 08-14-1955 Age: 58 y.o. Gender: female  Primary Care Provider: Golden Pop, MD Consultants: Cardiology  Code Status: Full  Chief Complaint: Chest pain and fatigue  Assessment and Plan: Wendy Kelly is a 58 y.o. female presenting with chest pain and fatigue . PMH is significant for second-degree heart block, pacemaker placement, ascending aortic aneurysm, syncope, expressive aphasia, orthostatic hypotension, anxiety, history of H. pylori colonization.  #Chest pain: Atypical, HEART score: 3. Euvolemic, recent echo showed normal EF and normal diastolic function, possible bicuspid aortic valve. Patient has a history significant for second-degree heart block, pacemaker placement, ascending aortic aneurysm. History of present illness significant for chest pain described as heavy/achy/sharp, left arm radiation, left face/jaw radiation. Pain improved with nitroglycerin. Differential includes CAD versus aortic dissection versus GERD versus MSK. - Admitted to telemetry under the care of family medicine teaching service. - Stat CTA chest - Consulting cardiology in AM - Repeat EKG in the a.m. - Cycle troponins - CBC/BMP - TSH/Lipid panel/Hb A1c - Morphine 2 mg every 2 hours when necessary for pain - Nitroglycerin - O2 by [place] prn hypoxemia - ASCVD risk 3.2%: not in a statin benefit group  Thoracic aortic aneurysm: Stable moderate dilitation of 4.25cm. CTA chest revealed no dissection nor significant change in diameter. Not etiology of of presenting symptoms.  - Can likely prolong surveillance to q2 years.  #Fatigue: Appears to be associated with chest pain. Differential includes cardiac etiology versus deconditioning versus endocrine etiology (menopausal/thyroid) versus infectious.  - TSH - Follow-up cards recs -  Continue home Prometrium 100 mg daily  #History of H. pylori colonization - FiberCon 625 mg daily, continue - Protonix 40 mg daily  FEN/GI: Heart healthy diet, IV saline lock Prophylaxis: Subcutaneous heparin  Disposition: Admit to FMTS with telemetry for ACS rule out.   History of Present Illness: Wendy Kelly is a 58 y.o. female presenting with and intermittent chest pain. Patient has a past medical history significant for ascending aortic aneurysm and second-degree heart block s/p pacemaker placement presents today with complaints of significant fatigue over the past 2 weeks. This is been progressive in nature and peaked yesterday where she "felt really bad" and exhausted. She states that at church today she suddenly felt extremely exhausted and had chest pain, facial/arm pain (left-sided), and diaphoresis. She describes the chest pain as sharp in nature. It lasts only a few seconds, and occurs approximately every 10-15 minutes. This chest pain began at the same time as the fatigue. Approximately 2 weeks ago pain was more achy in nature. It is progressed to the sharpness it is now. She also complains of some lightheadedness. As well as some palpitations. She states that nothing seems to make her symptoms worse. The symptoms also seem to occur in no specific pattern, including when at rest.  Prior to admission she has been given an ibuprofen which was provided by a Kelly at church at the beginning of this episode. EMS provided 81mg  x4 aspirin, and a nitroglycerin tablet. Troponin was negative 1. EKG unchanged. Patient was admitted to the family medicine teaching service for chest pain rule out.  Review Of Systems: Per HPI  Otherwise 12 point review of systems was performed and was unremarkable.  Patient Active Problem List   Diagnosis Date Noted  . Atypical chest pain 06/30/2014  . Malaise 09/13/2013  . Ascending aortic  aneurysm 10/23/2012  . Fatigue 10/23/2012  . Precordial pain  10/23/2012  . Facial numbness   . AV block, 2nd degree   . Expressive aphasia 06/11/2011  . Chest pain 06/11/2011  . Orthostatic hypotension 06/11/2011  . Palpitations 04/01/2011  . ABNORMAL ELECTROCARDIOGRAM 09/15/2009  . SYNCOPE AND COLLAPSE 08/20/2009  . DYSPNEA 08/20/2009  . 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onic 08/20/2009   Past Medical History: Past Medical History  Diagnosis Date  . AV block, 2nd degree     a. 05/2005 - s/p MDT Adapta ADDR01 Dual Chamber PPM  . Syncope     a. 04/2011 Echo: EF 55-65%, No RWMA, Gr 1 DD.  . AA (aortic aneurysm)     a. 04/2011 Echo: Ao Root: 4.1cm, Asc Ao 4.7cm.  . Depression   . Fatigue   . Expressive aphasia     a. ongoing since 04/2011 - seen by neurology - ? TIA vs. Migraine  . Facial numbness     a. ongoing since 04/2011 - seen by neurology - ? TIA vs. Migraine  . Anxiety   . Chest pain     a. 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   Past Surgical History: Past Surgical History  Procedure Laterality Date  . Pacemaker insertion  2006    Medtronic  . Cataract  2004  . Tonsillectomy    . Bladder tack    . Cholecystectomy  2000  . Vaginal hysterectomy    . Abdominal wall mesh  removal     Social History: History  Substance Use Topics  . Smoking status: Never Smoker   . Smokeless tobacco: Not on file     Comment: tobacco use -no  . Alcohol Use: No   Additional social history: None  Please also refer to relevant sections of EMR.  Family History: Family History  Problem Relation Age of Onset  . COPD Mother     alive @ 50  . Stroke Father     died @ 53   Allergies and Medications: Allergies  Allergen Reactions  . Cefdinir     unknown   . Codeine Nausea And Vomiting  . Entex Lq [Phenylephrine-Guaifenesin]   . Flagyl [Metronidazole] Swelling  . Guaifenesin & Derivatives Other (See Comments)    Night terrors  . Naproxen Sodium     Itching/rash  . Oxycodone Nausea And Vomiting  . Penicillins     Rash/hives  . Succinylcholine Other (See  Comments)    Trouble waking up  . Sulfonamide Derivatives     Rash/itching  . Tetracyclines & Related Other (See Comments)    Unkown  . Tape Rash   No current facility-administered medications on file prior to encounter.   Current Outpatient Prescriptions on File Prior to Encounter  Medication Sig Dispense Refill  . B Complex Vitamins (VITAMIN B COMPLEX PO) Take 1 tablet by mouth daily.     Marland Kitchen CALCIUM-VITAMIN D PO Take 1 tablet by mouth daily.     . Cyanocobalamin (VITAMIN B 12 PO) Take by mouth daily.    . fluticasone (FLONASE) 50 MCG/ACT nasal spray Place 1 spray into the nose daily.     Marland Kitchen ibuprofen (ADVIL,MOTRIN) 200 MG tablet Takes 2-3 tablets as needed.    . Loratadine (CLARITIN PO) Take by mouth daily.    . nitroGLYCERIN (NITROSTAT) 0.4 MG SL tablet Place 1 tablet (0.4 mg total) under the tongue every 5 (five) minutes as needed for chest pain. 25 tablet 6  . Nutritional Supplements (DHEA PO) Take 5 mg by mouth. Take  two tablets in the am    . omeprazole (PRILOSEC) 20 MG capsule Take 20 mg by mouth as needed (acid reflux).     . progesterone (PROMETRIUM) 100 MG capsule Take 100 mg by mouth daily.     . propranolol (INDERAL) 20 MG tablet Take 1 tablet (20 mg total) by mouth 3 (three) times daily as needed. (Patient taking differently: Take 20 mg by mouth 3 (three) times daily as needed (rapid heartbeat). ) 60 tablet 3    Objective: BP 128/94 mmHg  Pulse 85  Temp(Src) 98.1 F (36.7 C) (Oral)  Resp 16  Ht 5\' 4"  (1.626 m)  Wt 176 lb 4.8 oz (79.969 kg)  BMI 30.25 kg/m2  SpO2 98% Exam: General: Anxious-appearing 58 yo female in no distress HEENT: TMs normal bilaterally, PERRL, oropharynx clear Cardiovascular: RRR, no murmur, 2+ bilateral radial and femoral pulses, Pacemaker in place in left chest. Respiratory: Non-labored, CTAB Abdomen: +BS, soft, NT, ND Extremities: No edema, cap refill < 2 sec, no cyanosis or deformity Skin: No rashes or wounds Neuro: Alert and oriented,  normal gait and speech  Labs and Imaging: CBC BMET   Recent Labs Lab 06/30/14 1304  WBC 5.8  HGB 13.4  HCT 39.3  PLT 173    Recent Labs Lab 06/30/14 1304  NA 143  K 3.6*  CL 105  CO2 26  BUN 13  CREATININE 0.74  GLUCOSE 90  CALCIUM 9.2       Elberta Leatherwood, MD 06/30/2014, 5:01 PM PGY-1, [place] Intern pager: [phone], text pages welcome   I have seen and evaluated the patient with Dr. Alease Frame. I am in agreement with the note above in its revised form. My additions are in red.  Lalah Durango B. Bonner Puna, MD, PGY-2 07/01/2014 1:20 AM

## 2014-06-30 NOTE — ED Provider Notes (Signed)
CSN: [id number]     Arrival date & time 06/30/14  1203 History   First MD Initiated Contact with Patient 06/30/14 1209     Chief Complaint  Patient presents with  . 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 Patient reports having fatigue that started yesterday and left jaw pain that started when she woke up this morning. She reports sudden onset of left chest pain with radiation to left arm. The pain is described as a "heaviness" and "aching". She reports associated diaphoresis and overwhelming fatigue at this time. The chest pain lasted 30 minutes before she received nitro via EMS and her pain improved. Patient reports now having intermittent chest pain. No aggravating/alleviating factors. No other associated symptoms. Patient sees Dr. Rockey Situ with [facility].    Past Medical History  Diagnosis Date  . AV block, 2nd degree     a. 05/2005 - s/p MDT Adapta ADDR01 Dual Chamber PPM  . Syncope     a. 04/2011 Echo: EF 55-65%, No RWMA, Gr 1 DD.  . AA (aortic aneurysm)     a. 04/2011 Echo: Ao Root: 4.1cm, Asc Ao 4.7cm.  . Depression   . Fatigue   . Expressive aphasia     a. ongoing since 04/2011 - seen by neurology - ? TIA vs. Migraine  . Facial numbness     a. ongoing since 04/2011 - seen by neurology - ? TIA vs. Migraine  . Anxiety   . Chest pain     a. 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   Past Surgical History  Procedure Laterality Date  . Pacemaker insertion  2006    [employer]  . Cataract  2004  . Tonsillectomy    . Bladder tack    . Cholecystectomy  2000  . Vaginal hysterectomy    . Abdominal wall mesh  removal     Family History  Problem Relation Age of Onset  . COPD Mother      alive @ 22  . Stroke Father     died @ 67   History  Substance Use Topics  . Smoking status: Never Smoker   . Smokeless tobacco: Not on file     Comment: tobacco use -no  . Alcohol Use: No   OB History    No data available     Review of Systems  Constitutional: Positive for diaphoresis and fatigue. Negative for fever and chills.  HENT: Negative for trouble swallowing.   Eyes: Negative for visual disturbance.  Respiratory: Negative for shortness of breath.   Cardiovascular: Positive for chest pain. Negative for palpitations.  Gastrointestinal: Negative for nausea, vomiting, abdominal pain and diarrhea.  Genitourinary: Negative for dysuria and difficulty urinating.  Musculoskeletal: Negative for arthralgias and neck pain.  Skin: Negative for color change.  Neurological: Negative for dizziness and weakness.  Psychiatric/Behavioral: Negative for dysphoric mood.      Allergies  Cefdinir; Codeine; Entex lq; Flagyl; Guaifenesin & derivatives; Naproxen sodium; Oxycodone; Penicillins; Succinylcholine; Sulfonamide derivatives; Tetracyclines & related; and Tape  Home Medications   Prior to Admission medications   Medication Sig Start Date End Date Taking? Authorizing Provider  B Complex Vitamins (VITAMIN B COMPLEX PO) Take 1 tablet by mouth daily.    Yes Historical Provider, MD  CALCIUM-VITAMIN D PO Take 1 tablet by mouth daily.    Yes Historical Provider, MD  Cyanocobalamin (VITAMIN B 12 PO) Take by mouth daily.   Yes Historical Provider, MD  fexofenadine (ALLEGRA) 180 MG tablet Take 90 mg by mouth daily.   Yes Historical Provider, MD  fluticasone (FLONASE) 50 MCG/ACT nasal spray Place 1 spray into the nose daily.  09/18/12  Yes Historical Provider, MD  ibuprofen (ADVIL,MOTRIN) 200 MG tablet Takes 2-3 tablets as needed.   Yes Historical Provider, MD  Loratadine (CLARITIN PO) Take by mouth daily.   Yes Historical Provider, MD  nitroGLYCERIN (NITROSTAT) 0.4 MG SL tablet Place 1  tablet (0.4 mg total) under the tongue every 5 (five) minutes as needed for chest pain. 05/24/13  Yes Minna Merritts, MD  Nutritional Supplements (DHEA PO) Take 5 mg by mouth. Take two tablets in the am   Yes Historical Provider, MD  omeprazole (PRILOSEC) 20 MG capsule Take 20 mg by mouth as needed (acid reflux).  10/14/12  Yes Historical Provider, MD  polycarbophil (FIBERCON) 625 MG tablet Take 625 mg by mouth daily.   Yes Historical Provider, MD  PRESCRIPTION MEDICATION Place 1 tablet vaginally 2 (two) times a week. Tuesday and Saturdays E3 1mg  and Testosterone 1mg  compound   Yes Historical Provider, MD  progesterone (PROMETRIUM) 100 MG capsule Take 100 mg by mouth daily.  10/06/12  Yes Historical Provider, MD  LORazepam (ATIVAN) 1 MG tablet 0.5 mg as needed.    Historical Provider, MD  propranolol (INDERAL) 20 MG tablet Take 1 tablet (20 mg total) by mouth 3 (three) times daily as needed. Patient taking differently: Take 20 mg by mouth 3 (three) times daily as needed (rapid heartbeat).  05/22/12   Minna Merritts, MD   There were no vitals taken for this visit. Physical Exam  Constitutional: She is oriented to person, place, and time. She appears well-developed and well-nourished. No distress.  HENT:  Head: Normocephalic and atraumatic.  Eyes: Conjunctivae and EOM are normal.  Neck: Normal range of motion.  Cardiovascular: Normal rate and regular rhythm.  Exam reveals no gallop and no friction rub.   No murmur heard. Pulmonary/Chest: Effort normal and breath sounds normal. She has no wheezes. She has no rales. She exhibits no tenderness.  Abdominal: Soft. She exhibits no distension. There is no tenderness. There is no rebound.  Musculoskeletal: Normal range of motion.  Neurological: She is alert and oriented to person, place, and time. Coordination normal.  Speech is goal-oriented. Moves limbs without ataxia.   Skin: Skin is warm and dry.  Psychiatric: She has a normal mood and affect.  Her behavior is normal.  Nursing note and vitals reviewed.   ED Course  Procedures (including critical care time) Labs Review Labs Reviewed  BASIC METABOLIC PANEL - Abnormal; Notable for the following:    Potassium 3.6 (*)    All other components within normal limits  CBC WITH DIFFERENTIAL  I-STAT TROPOININ, ED    Imaging Review Dg Chest 2 View  06/30/2014   CLINICAL DATA:  Left chest pain, shortness of breath  EXAM: CHEST  2 VIEW  COMPARISON:  06/09/2005  FINDINGS: Lungs are essentially clear. No focal consolidation. No pleural effusion or pneumothorax.  Nodular opacity overlying the left lower lung corresponds to a nipple shadow.  The heart is normal in size.  Left subclavian pacemaker.  Visualized osseous structures are within normal limits.  IMPRESSION: No evidence of acute cardiopulmonary disease.   Electronically Signed  By: Julian Hy M.D.   On: 06/30/2014 13:28     EKG Interpretation   Date/Time:  Sunday June 30 2014 12:13:31 EST Ventricular Rate:  78 PR Interval:  189 QRS Duration: 95 QT Interval:  384 QTC Calculation: 437 R Axis:   -37 Text Interpretation:  Sinus rhythm Left axis deviation RSR' in V1 or V2,  probably normal variant Similar to prior Confirmed by Mingo Amber  MD, Wallace  [phone]) on 06/30/2014 12:32:27 PM      MDM   Final diagnoses:  Chest pain    1:20 PM Labs and chest xray pending. EKG shows no acute changes. Vitals stable and patient afebrile.   3:12 PM Labs and troponin negative. Chest xray shows no acute changes. Patient will be admitted to [facility] for chest pain rule out.   Alvina Chou, PA-C 06/30/14 1512  Evelina Bucy, MD 06/30/14 [phone]

## 2014-06-30 NOTE — Progress Notes (Signed)
Pt arrived to unit from ED in NAD. VSS. No CP currently. NSR on monitor. MD paged for orders. Will continue to monitor pt closely.

## 2014-06-30 NOTE — ED Notes (Signed)
Pt has been tired for the last 2 weeks. PT has been having pain in the left jaw radiates to lt arm. Patient has a pace marker and was told batteries need to be changed. Pt currently has a Triple A.  20 L hand.  Patient took 324 asprin and 1 Nitro with EMS. Vitals BP 150/90 Hr 80 Sats 100% EMS reports pace maker has not picked up but the pacer is pace on demand.

## 2014-06-30 NOTE — ED Notes (Signed)
Family Medicine at bedside 

## 2014-07-01 ENCOUNTER — Encounter (HOSPITAL_COMMUNITY): Payer: Self-pay | Admitting: Cardiology

## 2014-07-01 DIAGNOSIS — R5383 Other fatigue: Secondary | ICD-10-CM | POA: Diagnosis not present

## 2014-07-01 DIAGNOSIS — I369 Nonrheumatic tricuspid valve disorder, unspecified: Secondary | ICD-10-CM

## 2014-07-01 DIAGNOSIS — I712 Thoracic aortic aneurysm, without rupture: Secondary | ICD-10-CM | POA: Diagnosis not present

## 2014-07-01 DIAGNOSIS — R079 Chest pain, unspecified: Secondary | ICD-10-CM | POA: Diagnosis not present

## 2014-07-01 DIAGNOSIS — I441 Atrioventricular block, second degree: Secondary | ICD-10-CM | POA: Diagnosis not present

## 2014-07-01 LAB — HEMOGLOBIN A1C
HEMOGLOBIN A1C: 5.7 % — AB (ref <5.7)
Mean Plasma Glucose: 117 mg/dL — ABNORMAL HIGH (ref <117)

## 2014-07-01 LAB — TROPONIN I: Troponin I: <0.3 ng/mL (ref <0.30)

## 2014-07-01 MED ORDER — POTASSIUM CHLORIDE CRYS ER 20 MEQ PO TBCR
20.0000 meq | EXTENDED_RELEASE_TABLET | Freq: Every day | ORAL | Status: DC
  Administered 2014-07-01: 20 meq via ORAL
  Filled 2014-07-01: qty 1

## 2014-07-01 NOTE — Discharge Instructions (Signed)
Please call to make an appointment with your PCP. An appointment with your Cardiologist's PA has been made for 07/16/13.    Fatigue Fatigue is a feeling of tiredness, lack of energy, lack of motivation, or feeling tired all the time. Having enough rest, good nutrition, and reducing stress will normally reduce fatigue. Consult your caregiver if it persists. The nature of your fatigue will help your caregiver to find out its cause. The treatment is based on the cause.  CAUSES  There are many causes for fatigue. Most of the time, fatigue can be traced to one or more of your habits or routines. Most causes fit into one or more of three general areas. They are: Lifestyle problems  Sleep disturbances.  Overwork.  Physical exertion.  Unhealthy habits.  Poor eating habits or eating disorders.  Alcohol and/or drug use .  Lack of proper nutrition (malnutrition). Psychological problems  Stress and/or anxiety problems.  Depression.  Grief.  Boredom. Medical Problems or Conditions  Anemia.  Pregnancy.  Thyroid gland problems.  Recovery from major surgery.  Continuous pain.  Emphysema or asthma that is not well controlled  Allergic conditions.  Diabetes.  Infections (such as mononucleosis).  Obesity.  Sleep disorders, such as sleep apnea.  Heart failure or other heart-related problems.  Cancer.  Kidney disease.  Liver disease.  Effects of certain medicines such as antihistamines, cough and cold remedies, prescription pain medicines, heart and blood pressure medicines, drugs used for treatment of cancer, and some antidepressants. SYMPTOMS  The symptoms of fatigue include:   Lack of energy.  Lack of drive (motivation).  Drowsiness.  Feeling of indifference to the surroundings. DIAGNOSIS  The details of how you feel help guide your caregiver in finding out what is causing the fatigue. You will be asked about your present and past health condition. It is  important to review all medicines that you take, including prescription and non-prescription items. A thorough exam will be done. You will be questioned about your feelings, habits, and normal lifestyle. Your caregiver may suggest blood tests, urine tests, or other tests to look for common medical causes of fatigue.  TREATMENT  Fatigue is treated by correcting the underlying cause. For example, if you have continuous pain or depression, treating these causes will improve how you feel. Similarly, adjusting the dose of certain medicines will help in reducing fatigue.  HOME CARE INSTRUCTIONS   Try to get the required amount of good sleep every night.  Eat a healthy and nutritious diet, and drink enough water throughout the day.  Practice ways of relaxing (including yoga or meditation).  Exercise regularly.  Make plans to change situations that cause stress. Act on those plans so that stresses decrease over time. Keep your work and personal routine reasonable.  Avoid street drugs and minimize use of alcohol.  Start taking a daily multivitamin after consulting your caregiver. SEEK MEDICAL CARE IF:   You have persistent tiredness, which cannot be accounted for.  You have fever.  You have unintentional weight loss.  You have headaches.  You have disturbed sleep throughout the night.  You are feeling sad.  You have constipation.  You have dry skin.  You have gained weight.  You are taking any new or different medicines that you suspect are causing fatigue.  You are unable to sleep at night.  You develop any unusual swelling of your legs or other parts of your body. SEEK IMMEDIATE MEDICAL CARE IF:   You are feeling confused.  Your vision is blurred.  You feel faint or pass out.  You develop severe headache.  You develop severe abdominal, pelvic, or back pain.  You develop chest pain, shortness of breath, or an irregular or fast heartbeat.  You are unable to pass a  normal amount of urine.  You develop abnormal bleeding such as bleeding from the rectum or you vomit blood.  You have thoughts about harming yourself or committing suicide.  You are worried that you might harm someone else. MAKE SURE YOU:   Understand these instructions.  Will watch your condition.  Will get help right away if you are not doing well or get worse. Document Released: 04/25/2007 Document Revised: 09/20/2011 Document Reviewed: 10/30/2013 [facility] Patient Information 2015 [place], [place]. This information is not intended to replace advice given to you by your health care provider. Make sure you discuss any questions you have with your health care provider.

## 2014-07-01 NOTE — Progress Notes (Signed)
UR Completed.  [phone]  

## 2014-07-01 NOTE — Progress Notes (Signed)
Assessment unchanged. Discussed D/C instructions with pt including f/u appointments and new medications. Verbalized understanding. RX given to pt. IV and tele removed. Pt left with belongings accompanied by NT.  

## 2014-07-01 NOTE — Progress Notes (Signed)
Family Medicine Teaching Service Daily Progress Note Intern Pager: [phone]  Patient name: Wendy Kelly Medical record number: [id number] Date of birth: 08-10-1955 Age: 58 y.o. Gender: female  Primary Care Provider: Golden Pop, MD Consultants: cards Code Status: full  Pt Overview and Major Events to Date:  12/20: admitted for abnormal CP  Assessment and Plan: Wendy Kelly is a 58 y.o. female presenting with chest pain and fatigue . PMH is significant for second-degree heart block, pacemaker placement, ascending aortic aneurysm, syncope, expressive aphasia, orthostatic hypotension, anxiety, history of H. pylori colonization.  #Chest pain: Atypical, HEART score: 3. Euvolemic, recent echo showed normal EF and normal diastolic function, possible bicuspid aortic valve. Patient has a history significant for second-degree heart block, pacemaker placement, ascending aortic aneurysm. History of present illness significant for chest pain described as heavy/achy/sharp, left arm radiation, left face/jaw radiation. Pain improved with nitroglycerin. Differential includes CAD versus aortic dissection versus GERD versus MSK. - CTA chest: no change in aneurysmal diameter. - Cards consulted - Repeat EKG unchanged - Cycle troponins: neg - TSH 3.37; Lipid panel wnl; A1c 5.7 - Morphine 2 mg every 2 hours when necessary for pain - Nitroglycerin - O2 by [place] prn hypoxemia - ASCVD risk 3.2%: not in a statin benefit group - Echo ordered 2/2 risk of myocarditis  Thoracic aortic aneurysm: Stable moderate dilitation of 4.25cm. CTA chest revealed no dissection nor significant change in diameter. Not etiology of of presenting symptoms.  - Can likely prolong surveillance to q2 years.  #Fatigue: Appears to be associated with chest pain. Differential includes cardiac etiology versus deconditioning versus endocrine etiology (menopausal/thyroid) versus infectious.  - TSH wnl - Follow-up cards recs - Continue  home Prometrium 100 mg daily  #History of H. pylori colonization - FiberCon 625 mg daily, continue - Protonix 40 mg daily  FEN/GI: Heart healthy diet, IV saline lock Prophylaxis: Subcutaneous heparin  Disposition: home after echo  Subjective:  Patient feels better. No issues overnight  Objective: Temp:  [97.9 F (36.6 C)-98.1 F (36.7 C)] 97.9 F (36.6 C) (12/21 0354) Pulse Rate:  [72-87] 72 (12/21 0354) Resp:  [16] 16 (12/21 0354) BP: (104-149)/(47-94) 104/59 mmHg (12/21 0354) SpO2:  [96 %-100 %] 96 % (12/21 0354) Weight:  [176 lb 4.8 oz (79.969 kg)] 176 lb 4.8 oz (79.969 kg) (12/20 1556) Physical Exam: General: NAD HEENT:  PERRL, oropharynx clear, MMM Cardiovascular: RRR, no murmur, 2+ bilateral radial and femoral pulses, Pacemaker in place in left chest. Respiratory: Non-labored, CTAB Abdomen: +BS, soft, NT, ND Extremities: No edema, cap refill < 2 sec, no cyanosis or deformity Skin: No rashes or wounds Neuro: Alert and oriented, normal gait and speech  Laboratory:  Recent Labs Lab 06/30/14 1304  WBC 5.8  HGB 13.4  HCT 39.3  PLT 173    Recent Labs Lab 06/30/14 1304  NA 143  K 3.6*  CL 105  CO2 26  BUN 13  CREATININE 0.74  CALCIUM 9.2  GLUCOSE 90     Imaging/Diagnostic Tests: CTA 12/20 IMPRESSION: 1. Ascending thoracic aortic aneurysm measuring 4.6 cm maximally. Allowing for pulsation artifact, no evidence of dissection, pericardial effusion or mediastinal hematoma. Recommend semi-annual imaging followup by CTA or MRA and referral to cardiothoracic surgery if not already obtained. This recommendation follows 2010 ACCF/AHA/AATS/ACR/ASA/SCA/SCAI/SIR/STS/SVM Guidelines for the Diagnosis and Management of Patients With Thoracic Aortic Disease. Circulation. 2010; 121: L381-O175 2. Probable aortic valvular calcification. Pacemaker leads are stable in position. 3. Probable incidental findings in the upper abdomen including  hyperdensity within the  right renal collecting system, possibly related to previous contrast administration.    Elberta Leatherwood, MD 07/01/2014, 8:46 AM PGY-1, [place] Intern pager: [phone], text pages welcome

## 2014-07-01 NOTE — Discharge Summary (Signed)
[facility] Discharge Summary  Patient name: Wendy Kelly Medical record number: [id number] Date of birth: Jun 16, 1956 Age: 58 y.o. Gender: female Date of Admission: 06/30/2014  Date of Discharge: 07/01/2014  Admitting Physician: Dickie La, MD  Primary Care Provider: Golden Pop, MD Consultants: cardiology  Indication for Hospitalization: Atypical chest pain  Discharge Diagnoses/Problem List:  Atypical chest pain Ascending aortic aneurysm Second-degree heart block with pacemaker placement Intermittent fatigue  Disposition: Home  Discharge Condition: Stable  Discharge Exam:  General: NAD HEENT: PERRL, oropharynx clear, MMM Cardiovascular: RRR, no murmur, 2+ bilateral radial and femoral pulses, Pacemaker in place in left chest. Respiratory: Non-labored, CTAB Abdomen: +BS, soft, NT, ND Extremities: No edema, cap refill < 2 sec, no cyanosis or deformity Skin: No rashes or wounds Neuro: Alert and oriented, normal gait and speech  Brief Hospital Course:  Patient is a 58 year old female who presented with intermittent chest pain and fatigue. Past medical history was significant for ascending aortic aneurysm and second-degree heart status post pacemaker placement. She complained of increased fatigue over the past 2 weeks. She was in church when she felt suddenly extremely exhausted. At that time she also had chest pain, facial/arm pain of the left side. And diaphoresis. She described the chest pain and sharp in nature and only last approximately 5-10 seconds, and occurs every 10-15 minutes. Chest pain began at the same time as the onset of fatigue. She states that these symptoms began approximately 2 weeks ago and were more aching in nature at the time.  Patient was given aspirin and nitroglycerin tablet. Troponins were cycled and were negative 3. EKGs were obtained and were unchanged. Chest CT was obtained which showed a dilated aorta which was  unchanged from previous imaging. Cardiology was consulted. Cardiology was confident in patient's stability from a cardiac standpoint. He recommended discharge with close follow-up with her outpatient cardiologist and her care provider. Echocardiogram was ordered by family medicine service to rule out possibility of viral myocarditis. Echo showed normal left ventricular function, grade 1 diastolic dysfunction, ascending aortic aneurysm, and probable bicuspid aortic valve.  Patient was deemed stable for discharge and was released from our care.  Issues for Follow Up:  Indeterminate fatigue without any evidence of obvious cause.  Significant Procedures: Echo  Significant Labs and Imaging:   Recent Labs Lab 06/30/14 1304  WBC 5.8  HGB 13.4  HCT 39.3  PLT 173    Recent Labs Lab 06/30/14 1304  NA 143  K 3.6*  CL 105  CO2 26  GLUCOSE 90  BUN 13  CREATININE 0.74  CALCIUM 9.2      Results/Tests Pending at Time of Discharge: None  Discharge Medications:    Medication List    TAKE these medications        CALCIUM-VITAMIN D PO  Take 1 tablet by mouth daily.     CLARITIN PO  Take by mouth daily.     DHEA PO  Take 5 mg by mouth. Take two tablets in the am     fexofenadine 180 MG tablet  Commonly known as:  ALLEGRA  Take 90 mg by mouth daily.     fluticasone 50 MCG/ACT nasal spray  Commonly known as:  FLONASE  Place 1 spray into the nose daily.     ibuprofen 200 MG tablet  Commonly known as:  ADVIL,MOTRIN  Takes 2-3 tablets as needed.     nitroGLYCERIN 0.4 MG SL tablet  Commonly known as:  NITROSTAT  Place 1 tablet (0.4 mg total) under the tongue every 5 (five) minutes as needed for chest pain.     omeprazole 20 MG capsule  Commonly known as:  PRILOSEC  Take 20 mg by mouth as needed (acid reflux).     polycarbophil 625 MG tablet  Commonly known as:  FIBERCON  Take 625 mg by mouth daily.     PRESCRIPTION MEDICATION  - Place 1 tablet vaginally 2 (two) times  a week. Tuesday and Saturdays  - E3 1mg  and Testosterone 1mg  compound     progesterone 100 MG capsule  Commonly known as:  PROMETRIUM  Take 100 mg by mouth daily.     propranolol 20 MG tablet  Commonly known as:  INDERAL  Take 1 tablet (20 mg total) by mouth 3 (three) times daily as needed.     VITAMIN B 12 PO  Take by mouth daily.     VITAMIN B COMPLEX PO  Take 1 tablet by mouth daily.        Discharge Instructions: Please refer to Patient Instructions section of EMR for full details.  Patient was counseled important signs and symptoms that should prompt return to medical care, changes in medications, dietary instructions, activity restrictions, and follow up appointments.   Follow-Up Appointments: Follow-up Information    Follow up with Ida Rogue, MD On 07/16/2014.   Specialty:  Cardiology   Why:  @1 :15pm   Contact information:   [place] [address] [phone]       Follow up with Golden Pop, MD. Schedule an appointment as soon as possible for a visit in 2 days.   Specialty:  Family Medicine   Why:  for follow up in the next 7-10 days   Contact information:   [place] [address] [phone]       Elberta Leatherwood, MD 07/01/2014, 11:13 PM PGY-1, [place]

## 2014-07-01 NOTE — Progress Notes (Signed)
Pt complained of chest pain 3/10 radiating to her left jaw.  EKG performed showing non specific ST abnormality.  VSS.  Pt given nitro x1 but refused to take anymore.  Pt also refusing morphine.   MD notified and came to floor to look over EKG and assess pt (EKG currently not transferring to [facility]).  Pt currently states she is having no chest pain.  Will continue to monitor closely.  Claudette Stapler, RN

## 2014-07-01 NOTE — Progress Notes (Signed)
S: Received call from nurse that patient was experiencing chest pain and had received nitroglycerin. On arrival to the patient's room, patient reported that her chest pain had subsided. Said the pain was the same as the pain she had earlier. Described as starting in her left jaw and radiating to her chest. No shortness of breath. No nausea or vomiting. No diaphoresis. Chest pain is very brief in duration and is relieved with nitroglycerin  O:  Blood pressure 115/47, pulse 73, temperature 97.9 F (36.6 C), temperature source Oral, resp. rate 16, height 5\' 4"  (1.626 m), weight 176 lb 4.8 oz (79.969 kg), SpO2 100 %. GEN: NAD, lying in bed comfortably RRR, no murmurs appreciated, 2+ radial pulses bilaterally. EKG: NSR with no acute ischemic changes.  A/P: Chest pain consistent with prior episodes. No signs of ischemia on EKG. Will continue to monitor, cycle troponin, and repeat EKG in the AM.   [facility]. Jerline Pain, [place] Resident PGY-1 07/01/2014 2:44 AM

## 2014-07-01 NOTE — Consult Note (Signed)
Primary cardiologist: Dr Wendy Kelly  HPI: 58 year old female with past medical history of mitral valve prolapse, pacemaker placement, dilated aorta for evaluation of chest pain. Patient had a nuclear study in January 2014 that showed an ejection fraction of 71% and no ischemia. Echocardiogram May 2015 showed normal LV function. Probable bicuspid aortic valve with mild aortic insufficiency. Moderately dilated ascending aorta and mild left atrial enlargement. Patient apparently has had problems with occasional chest pain for quite some time. She states she typically does not have significant dyspnea on exertion, orthopnea, PND, pedal edema or exertional chest pain. She has been feeling fatigued for approximately 2 weeks. She became more fatigued Saturday evening and yesterday. She then developed pain in her face followed by radiation to her left upper extremity and chest. The pain would last for seconds and resolved. Not pleuritic, positional or related to food. Not exertional. No associated symptoms. She was admitted and cardiology asked to evaluate.  Medications Prior to Admission  Medication Sig Dispense Refill  . B Complex Vitamins (VITAMIN B COMPLEX PO) Take 1 tablet by mouth daily.     Marland Kitchen CALCIUM-VITAMIN D PO Take 1 tablet by mouth daily.     . Cyanocobalamin (VITAMIN B 12 PO) Take by mouth daily.    . fexofenadine (ALLEGRA) 180 MG tablet Take 90 mg by mouth daily.    . fluticasone (FLONASE) 50 MCG/ACT nasal spray Place 1 spray into the nose daily.     Marland Kitchen ibuprofen (ADVIL,MOTRIN) 200 MG tablet Takes 2-3 tablets as needed.    . Loratadine (CLARITIN PO) Take by mouth daily.    . nitroGLYCERIN (NITROSTAT) 0.4 MG SL tablet Place 1 tablet (0.4 mg total) under the tongue every 5 (five) minutes as needed for chest pain. 25 tablet 6  . Nutritional Supplements (DHEA PO) Take 5 mg by mouth. Take two tablets in the am    . omeprazole (PRILOSEC) 20 MG capsule Take 20 mg by mouth as needed (acid reflux).       . polycarbophil (FIBERCON) 625 MG tablet Take 625 mg by mouth daily.    Marland Kitchen PRESCRIPTION MEDICATION Place 1 tablet vaginally 2 (two) times a week. Tuesday and Saturdays E3 48m and Testosterone 174mcompound    . progesterone (PROMETRIUM) 100 MG capsule Take 100 mg by mouth daily.     . propranolol (INDERAL) 20 MG tablet Take 1 tablet (20 mg total) by mouth 3 (three) times daily as needed. (Patient taking differently: Take 20 mg by mouth 3 (three) times daily as needed (rapid heartbeat). ) 60 tablet 3  . [DISCONTINUED] LORazepam (ATIVAN) 1 MG tablet 0.5 mg as needed.      Allergies  Allergen Reactions  . Cefdinir     unknown   . Codeine Nausea And Vomiting  . Entex Lq [Phenylephrine-Guaifenesin]   . Flagyl [Metronidazole] Swelling  . Guaifenesin & Derivatives Other (See Comments)    Night terrors  . Lactose Intolerance (Gi) Other (See Comments)    GI upset  . Naproxen Sodium     Itching/rash  . Oxycodone Nausea And Vomiting  . Penicillins     Rash/hives  . Succinylcholine Other (See Comments)    Trouble waking up  . Sulfonamide Derivatives     Rash/itching  . Tetracyclines & Related Other (See Comments)    Unkown  . Tape Rash     Past Medical History  Diagnosis Date  . AV block, 2nd degree     a. 05/2005 - s/p MDT Adapta  ADDR01 Dual Chamber PPM  . Syncope     a. 04/2011 Echo: EF 55-65%, No RWMA, Gr 1 DD.  . AA (aortic aneurysm)     a. 04/2011 Echo: Ao Root: 4.1cm, Asc Ao 4.7cm.  . Depression   . Expressive aphasia     a. ongoing since 04/2011 - seen by neurology - ? TIA vs. Migraine  . Facial numbness     a. ongoing since 04/2011 - seen by neurology - ? TIA vs. Migraine  . Anxiety   . Chest pain     a. 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    Past Surgical History  Procedure Laterality Date  . Pacemaker insertion  2006    Medtronic  . Cataract  2004  . Tonsillectomy    . Bladder tack    . Cholecystectomy  2000  . Vaginal hysterectomy    . Abdominal wall mesh  removal       History   Social History  . Marital Status: Married    Spouse Name: N/A    Number of Children: N/A  . Years of Education: N/A   Occupational History  . Not on file.   Social History Main Topics  . Smoking status: Never Smoker   . Smokeless tobacco: Not on file     Comment: tobacco use -no  . Alcohol Use: No  . Drug Use: No  . Sexual Activity: Not on file   Other Topics Concern  . Not on file   Social History Narrative   Lives in [place] with husband.  Works out regularly.  Works as a Geophysicist/field seismologist - owns own business.     Family History  Problem Relation Age of Onset  . COPD Mother     alive @ 42  . Stroke Father     died @ 71    ROS:  no fevers or chills, productive cough, hemoptysis, dysphasia, odynophagia, melena, hematochezia, dysuria, hematuria, rash, seizure activity, orthopnea, PND, pedal edema, claudication. Remaining systems are negative.  Physical Exam:   Blood pressure 104/59, pulse 72, temperature 97.9 F (36.6 C), temperature source Oral, resp. rate 16, height _0  (1.626 m), weight 176 lb 4.8 oz (79.969 kg), SpO2 96 %.  General:  Well developed/well nourished in NAD Skin warm/dry Patient not depressed No peripheral clubbing Back-normal HEENT-normal/normal eyelids Neck supple/normal carotid upstroke bilaterally; no bruits; no JVD; no thyromegaly chest - CTA/ normal expansion CV - RRR/normal S1 and S2; no rubs or gallops;  PMI nondisplaced, 2/6 systolic murmur LSB Abdomen -NT/ND, no HSM, no mass, + bowel sounds, no bruit 2+ femoral pulses, no bruits Ext-no edema, chords, 2+ DP Neuro-grossly nonfocal  ECG sinus rhythm with no ST changes.  Results for orders placed or performed during the hospital encounter of 06/30/14 (from the past 48 hour(s))  CBC with Differential     Status: None   Collection Time: 06/30/14  1:04 PM  Result Value Ref Range   WBC 5.8 4.0 - 10.5 K/uL   RBC 4.46 3.87 - 5.11 MIL/uL   Hemoglobin 13.4 12.0 -  15.0 g/dL   HCT 39.3 36.0 - 46.0 %   MCV 88.1 78.0 - 100.0 fL   MCH 30.0 26.0 - 34.0 pg   MCHC 34.1 30.0 - 36.0 g/dL   RDW 12.9 11.5 - 15.5 %   Platelets 173 150 - 400 K/uL   Neutrophils Relative % 74 43 - 77 %   Neutro Abs 4.3 1.7 - 7.7 K/uL   Lymphocytes  Relative 17 12 - 46 %   Lymphs Abs 1.0 0.7 - 4.0 K/uL   Monocytes Relative 7 3 - 12 %   Monocytes Absolute 0.4 0.1 - 1.0 K/uL   Eosinophils Relative 2 0 - 5 %   Eosinophils Absolute 0.1 0.0 - 0.7 K/uL   Basophils Relative 1 0 - 1 %   Basophils Absolute 0.0 0.0 - 0.1 K/uL  Basic metabolic panel     Status: Abnormal   Collection Time: 06/30/14  1:04 PM  Result Value Ref Range   Sodium 143 137 - 147 mEq/L   Potassium 3.6 (L) 3.7 - 5.3 mEq/L   Chloride 105 96 - 112 mEq/L   CO2 26 19 - 32 mEq/L   Glucose, Bld 90 70 - 99 mg/dL   BUN 13 6 - 23 mg/dL   Creatinine, Ser 0.74 0.50 - 1.10 mg/dL   Calcium 9.2 8.4 - 10.5 mg/dL   GFR calc non Af Amer >90 >90 mL/min   GFR calc Af Amer >90 >90 mL/min    Comment: (NOTE) The eGFR has been calculated using the CKD EPI equation. This calculation has not been validated in all clinical situations. eGFR's persistently <90 mL/min signify possible Chronic Kidney Disease.    Anion gap 12 5 - 15  I-stat troponin, ED     Status: None   Collection Time: 06/30/14  1:15 PM  Result Value Ref Range   Troponin i, poc 0.00 0.00 - 0.08 ng/mL   Comment 3            Comment: Due to the release kinetics of cTnI, a negative result within the first hours of the onset of symptoms does not rule out myocardial infarction with certainty. If myocardial infarction is still suspected, repeat the test at appropriate intervals.   Troponin I-serum (0, 3, 6 hours)     Status: None   Collection Time: 06/30/14  5:47 PM  Result Value Ref Range   Troponin I <0.30 <0.30 ng/mL    Comment:        Due to the release kinetics of cTnI, a negative result within the first hours of the onset of symptoms does not rule  out myocardial infarction with certainty. If myocardial infarction is still suspected, repeat the test at appropriate intervals.   Lipid panel     Status: None   Collection Time: 06/30/14  5:47 PM  Result Value Ref Range   Cholesterol 142 0 - 200 mg/dL   Triglycerides 88 <150 mg/dL   HDL 50 >39 mg/dL   Total CHOL/HDL Ratio 2.8 RATIO   VLDL 18 0 - 40 mg/dL   LDL Cholesterol 74 0 - 99 mg/dL    Comment:        Total Cholesterol/HDL:CHD Risk Coronary Heart Disease Risk Table                     Men   Women  1/2 Average Risk   3.4   3.3  Average Risk       5.0   4.4  2 X Average Risk   9.6   7.1  3 X Average Risk  23.4   11.0        Use the calculated Patient Ratio above and the CHD Risk Table to determine the patient's CHD Risk.        ATP III CLASSIFICATION (LDL):  <100     mg/dL   Optimal  100-129  mg/dL   Near  or Above                    Optimal  130-159  mg/dL   Borderline  160-189  mg/dL   High  >190     mg/dL   Very High   TSH     Status: None   Collection Time: 06/30/14  5:47 PM  Result Value Ref Range   TSH 3.370 0.350 - 4.500 uIU/mL  Troponin I-serum (0, 3, 6 hours)     Status: None   Collection Time: 06/30/14 10:50 PM  Result Value Ref Range   Troponin I <0.30 <0.30 ng/mL    Comment:        Due to the release kinetics of cTnI, a negative result within the first hours of the onset of symptoms does not rule out myocardial infarction with certainty. If myocardial infarction is still suspected, repeat the test at appropriate intervals.   Troponin I-serum (0, 3, 6 hours)     Status: None   Collection Time: 07/01/14  4:45 AM  Result Value Ref Range   Troponin I <0.30 <0.30 ng/mL    Comment:        Due to the release kinetics of cTnI, a negative result within the first hours of the onset of symptoms does not rule out myocardial infarction with certainty. If myocardial infarction is still suspected, repeat the test at appropriate intervals.     Dg Chest  2 View  06/30/2014   CLINICAL DATA:  Left chest pain, shortness of breath  EXAM: CHEST  2 VIEW  COMPARISON:  06/09/2005  FINDINGS: Lungs are essentially clear. No focal consolidation. No pleural effusion or pneumothorax.  Nodular opacity overlying the left lower lung corresponds to a nipple shadow.  The heart is normal in size.  Left subclavian pacemaker.  Visualized osseous structures are within normal limits.  IMPRESSION: No evidence of acute cardiopulmonary disease.   Electronically Signed   By: Julian Hy M.D.   On: 06/30/2014 13:28   Ct Angio Chest Aorta W/cm &/or Wo/cm  06/30/2014   CLINICAL DATA:  Left chest pain extending into the jaw and arm in today. Fatigue with near syncopal episode. Ascending aortic aneurysm.  EXAM: CT ANGIOGRAPHY CHEST WITH CONTRAST  TECHNIQUE: Multidetector CT imaging of the chest was performed using the standard protocol during bolus administration of intravenous contrast. Multiplanar CT image reconstructions and MIPs were obtained to evaluate the vascular anatomy.  CONTRAST:  13m OMNIPAQUE IOHEXOL 350 MG/ML SOLN  COMPARISON:  Radiographs 06/30/2014 and 06/09/2005.  FINDINGS: Vascular: Evaluation of the ascending aorta and is limited by moderate pulsation artifact on this nongated study. The ascending aorta measures up to 4.6 cm transverse. Pre contrast images demonstrate no mural thrombus or displaced intimal calcifications. Post-contrast, the aortic lumen opacifies normally. Allowing for the pulsation artifact, there is no definite dissection. The aortic arch and descending aorta appear normal. There is normal opacification of the great vessels. The pulmonary arteries are well opacified with contrast. There is no evidence of acute pulmonary embolism. Left-sided pacemaker leads extend into the right atrium and right ventricle.The heart size is normal. There is no pericardial effusion. There are possible aortic valvular calcifications.  Mediastinum: There are no  enlarged mediastinal, hilar or axillary lymph nodes. There is no mediastinal hematoma. The thyroid gland, trachea and esophagus demonstrate no significant findings.  Lungs/Pleura: There is no pleural effusion.  The lungs are clear.  Upper abdomen: There is a probable tiny cyst in the  dome of the right hepatic lobe. Patient is status post cholecystectomy. The pre contrast images demonstrate probable increased density within the right renal pelvis. This area is not included on the post-contrast images. There is a small right adrenal adenoma measuring -5 HU.  Musculoskeletal/Chest wall: No chest wall lesion or acute osseous findings.  Review of the MIP images confirms the above findings.  IMPRESSION: 1. Ascending thoracic aortic aneurysm measuring 4.6 cm maximally. Allowing for pulsation artifact, no evidence of dissection, pericardial effusion or mediastinal hematoma. Recommend semi-annual imaging followup by CTA or MRA and referral to cardiothoracic surgery if not already obtained. This recommendation follows 2010 ACCF/AHA/AATS/ACR/ASA/SCA/SCAI/SIR/STS/SVM Guidelines for the Diagnosis and Management of Patients With Thoracic Aortic Disease. Circulation. 2010; 121: E830-N354 2. Probable aortic valvular calcification. Pacemaker leads are stable in position. 3. Probable incidental findings in the upper abdomen including hyperdensity within the right renal collecting system, possibly related to previous contrast administration.   Electronically Signed   By: Camie Patience M.D.   On: 06/30/2014 17:46    Assessment/Plan 1 chest pain-symptoms are atypical. Patient has ruled out. Electrocardiogram shows no ST changes. Chest CT shows dilated aorta which is chronic and unchanged. No dissection. Patient can be discharged from a cardiac standpoint and follow-up with Dr. Rockey Kelly to arrange outpatient functional study. 2 ascending thoracic aortic aneurysm-this appears to be unchanged measuring 4.6 cm. This will be followed in  [place]. There is no dissection noted. 3 history of pacemaker-follow-up Dr. Caryl Comes as previously scheduled. Please call with questions.  Kirk Ruths MD 07/01/2014, 9:16 AM

## 2014-07-01 NOTE — Progress Notes (Signed)
  Echocardiogram 2D Echocardiogram has been performed.  Wendy Kelly FRANCES 07/01/2014, 2:31 PM

## 2014-07-16 ENCOUNTER — Encounter: Payer: Self-pay | Admitting: Physician Assistant

## 2014-07-16 ENCOUNTER — Ambulatory Visit (INDEPENDENT_AMBULATORY_CARE_PROVIDER_SITE_OTHER): Payer: BLUE CROSS/BLUE SHIELD | Admitting: Physician Assistant

## 2014-07-16 VITALS — BP 121/77 | HR 93 | Ht 64.0 in | Wt 181.0 lb

## 2014-07-16 DIAGNOSIS — I7121 Aneurysm of the ascending aorta, without rupture: Secondary | ICD-10-CM

## 2014-07-16 DIAGNOSIS — T733XXD Exhaustion due to excessive exertion, subsequent encounter: Secondary | ICD-10-CM

## 2014-07-16 DIAGNOSIS — R208 Other disturbances of skin sensation: Secondary | ICD-10-CM

## 2014-07-16 DIAGNOSIS — R5381 Other malaise: Secondary | ICD-10-CM

## 2014-07-16 DIAGNOSIS — I441 Atrioventricular block, second degree: Secondary | ICD-10-CM

## 2014-07-16 DIAGNOSIS — R200 Anesthesia of skin: Secondary | ICD-10-CM

## 2014-07-16 DIAGNOSIS — R0789 Other chest pain: Secondary | ICD-10-CM

## 2014-07-16 DIAGNOSIS — I712 Thoracic aortic aneurysm, without rupture: Secondary | ICD-10-CM

## 2014-07-16 DIAGNOSIS — R0602 Shortness of breath: Secondary | ICD-10-CM

## 2014-07-16 NOTE — Patient Instructions (Signed)
[facility]  Your caregiver has ordered a Stress Test with nuclear imaging. The purpose of this test is to evaluate the blood supply to your heart muscle. This procedure is referred to as a "Non-Invasive Stress Test." This is because other than having an IV started in your vein, nothing is inserted or "invades" your body. Cardiac stress tests are done to find areas of poor blood flow to the heart by determining the extent of coronary artery disease (CAD). Some patients exercise on a treadmill, which naturally increases the blood flow to your heart, while others who are  unable to walk on a treadmill due to physical limitations have a pharmacologic/chemical stress agent called Lexiscan . This medicine will mimic walking on a treadmill by temporarily increasing your coronary blood flow.   Please note: these test may take anywhere between 2-4 hours to complete  PLEASE REPORT TO [facility] AT THE FIRST DESK WILL DIRECT YOU WHERE TO GO  Date of Procedure:______Thursday 1/7/16__at  8:00 AM_____________________________  Arrival Time for Procedure:_______7:45 AM_______________________  Instructions regarding medication:   ____ : Hold diabetes medication morning of procedure  __x__:  Hold betablocker(s) night before procedure and morning of procedure - propranolol  ____:  Hold other medications as follows:_________________________________________________________________________________________________________________________________________________________________________________________________________________________________________________________________________________________  PLEASE NOTIFY THE OFFICE AT LEAST 24 HOURS IN ADVANCE IF YOU ARE UNABLE TO KEEP YOUR APPOINTMENT.  [phone] AND  PLEASE NOTIFY NUCLEAR MEDICINE AT [facility] AT LEAST 24 HOURS IN ADVANCE IF YOU ARE UNABLE TO KEEP YOUR APPOINTMENT. [phone]  How to prepare for your Myoview test:  1. Do  not eat or drink after midnight 2. No caffeine for 24 hours prior to test 3. No smoking 24 hours prior to test. 4. Your medication may be taken with water.  If your doctor stopped a medication because of this test, do not take that medication. 5. Ladies, please do not wear dresses.  Skirts or pants are appropriate. Please wear a short sleeve shirt. 6. No perfume, cologne or lotion. 7. Wear comfortable walking shoes. No heels!   Your physician recommends that you schedule a follow-up appointment in: 1 month with Christell Faith, PA

## 2014-07-16 NOTE — Progress Notes (Signed)
Patient Name: Wendy Kelly, Wendy Kelly 03-05-1956, MRN [id number]  Date of Encounter: 07/16/2014  Primary Care Provider:  Golden Pop, MD Primary Cardiologist:  Dr. Rockey Situ, MD  Patient Profile:  59 y.o. female with history of 2nd degree AV block s/p MDT dual chamber PPM 05/2005, ascending aortic aneurysm measured at 4.6 cm maximally on 06/30/2014, history of syncope, and TIA-like symptoms with right facial droop and expressive aphasia that resolved, and history of intermittent chest pain who presents for hospital follow up from 12/20-12/21 after recent admisson for atypical chest pain at [facility].   Problem List:   Past Medical History  Diagnosis Date  . AV block, 2nd degree     a. 05/2005 - s/p MDT Adapta ADDR01 Dual Chamber PPM  . Syncope     a. 04/2011 Echo: EF 55-65%, No RWMA, Gr 1 DD.  . AA (aortic aneurysm)     a. 04/2011 Echo: Ao Root: 4.1cm, Asc Ao 4.7cm.  . Depression   . Expressive aphasia     a. ongoing since 04/2011 - seen by neurology - ? TIA vs. Migraine  . Facial numbness     a. ongoing since 04/2011 - seen by neurology - ? TIA vs. Migraine  . Anxiety   . Chest pain     a. 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  . Bicuspid aortic valve    Past Surgical History  Procedure Laterality Date  . Pacemaker insertion  2006    Medtronic  . Cataract  2004  . Tonsillectomy    . Bladder tack    . Cholecystectomy  2000  . Vaginal hysterectomy    . Abdominal wall mesh  removal       Allergies:  Allergies  Allergen Reactions  . Cefdinir     unknown   . Codeine Nausea And Vomiting  . Entex Lq [Phenylephrine-Guaifenesin]   . Flagyl [Metronidazole] Swelling  . Guaifenesin & Derivatives Other (See Comments)    Night terrors  . Lactose Intolerance (Gi) Other (See Comments)    GI upset  . Naproxen Sodium     Itching/rash  . Oxycodone Nausea And Vomiting  . Penicillins     Rash/hives  . Succinylcholine Other (See Comments)    Trouble waking up  . Sulfonamide Derivatives    Rash/itching  . Tetracyclines & Related Other (See Comments)    Unkown  . Tape Rash     HPI:  59 y.o. female with the above problem list presents for hospital follow up. Patient with history of ascending aortic aneurysm estimated at 4.6 cm maxially on 06/30/2014 (recommend semiannual measurement and referral to cardiothoracic surgery), history of 2nd degree AV block s/p MDT dual chamber PPM 05/2005, remote syncope, TIA-like symptoms, and intermittent chest pain. Echo 2013 showed EF >55%, moderately dilated ascending aorta of 4.2 cm. Repeat echo 2014 showed an EF 55-60%, likely bicuspid aortic valve, GR1DD, mild AI, aortic root of 4.1 cm, ascending aorta moderately dilated at 4.8 cm. Echo on 11/20/2013 showed an EF of 55-60%, no regional WMA, aortic valve possibly bicuspid, ascending aorta moderately dialted at 4.25 cm. Echo 07/01/2014 showed an EF of 50-55%, GR1DD, trivial aortic regurge, ascending aorta moderately dilated. CTA measured at 4.6 cm on 06/30/2014. Last nuclear stress test 07/2012 showed exercise myocardial perfusion imagain study with no significant ischemia noted. Normal perfusion in stress and rest images. Normal EF estimated at 71%. No WMA noted. No EKG changes concerning for ischemia. Overall, low risk scan.   She presented to [facility] on  06/30/2014 with a 2 week history of progressive fatigue and return of her intermittent chest pain. She had been feeling quite fatigued for 2 weeks leading up to her admission. While in church on 12/20 she suddenly felt exhausted and developed chest pain, facial pain, left arm pain, and diaphoresis. Symptoms were intermittenet every 10-15 minutes, lasting only a few seconds each time. EMS was called and transported her to [facility]. During her admission CTA confirmed stable ascending aortic aneurysm as above without dissection, echo as above, EKG without acute changes, troponin negative x 3. TSH, CBC, and bmet all normal.   She comes in still feeling fatigued,  though not quite as much. No further chest pain, facial pain, or arm pain. She has returned back to work without issues. Her BP was found to be hypotensive with readings in the low 100s/40s per her report and she was feeling horrible at the time. PPM battery is due for replacement around the end of this year - scheduled for rechecks q 3 months. Weight has been stable.    Home Medications:  Prior to Admission medications   Medication Sig Start Date End Date Taking? Authorizing Provider  B Complex Vitamins (VITAMIN B COMPLEX PO) Take 1 tablet by mouth daily.     Historical Provider, MD  CALCIUM-VITAMIN D PO Take 1 tablet by mouth daily.     Historical Provider, MD  Cyanocobalamin (VITAMIN B 12 PO) Take by mouth daily.    Historical Provider, MD  fexofenadine (ALLEGRA) 180 MG tablet Take 90 mg by mouth daily.    Historical Provider, MD  fluticasone (FLONASE) 50 MCG/ACT nasal spray Place 1 spray into the nose daily.  09/18/12   Historical Provider, MD  ibuprofen (ADVIL,MOTRIN) 200 MG tablet Takes 2-3 tablets as needed.    Historical Provider, MD  Loratadine (CLARITIN PO) Take by mouth daily.    Historical Provider, MD  nitroGLYCERIN (NITROSTAT) 0.4 MG SL tablet Place 1 tablet (0.4 mg total) under the tongue every 5 (five) minutes as needed for chest pain. 05/24/13   Minna Merritts, MD  Nutritional Supplements (DHEA PO) Take 5 mg by mouth. Take two tablets in the am    Historical Provider, MD  omeprazole (PRILOSEC) 20 MG capsule Take 20 mg by mouth as needed (acid reflux).  10/14/12   Historical Provider, MD  polycarbophil (FIBERCON) 625 MG tablet Take 625 mg by mouth daily.    Historical Provider, MD  PRESCRIPTION MEDICATION Place 1 tablet vaginally 2 (two) times a week. Tuesday and Saturdays E3 1mg  and Testosterone 1mg  compound    Historical Provider, MD  progesterone (PROMETRIUM) 100 MG capsule Take 100 mg by mouth daily.  10/06/12   Historical Provider, MD  propranolol (INDERAL) 20 MG tablet Take 1  tablet (20 mg total) by mouth 3 (three) times daily as needed. Patient taking differently: Take 20 mg by mouth 3 (three) times daily as needed (rapid heartbeat).  05/22/12   Minna Merritts, MD     Weights: Filed Weights   07/16/14 1327  Weight: 181 lb (82.101 kg)     Review of Systems:  As above. All other systems reviewed and are otherwise negative except as noted above.  Physical Exam:  Blood pressure 121/77, pulse 93, height 5\' 4"  (1.626 m), weight 181 lb (82.101 kg).  General: Pleasant, NAD Psych: Normal affect. Neuro: Alert and oriented X 3. Moves all extremities spontaneously. HEENT: Normal  Neck: Supple without bruits or JVD. Lungs:  Resp regular and unlabored, CTA.  Heart: RRR no s3, s4, or murmurs. Abdomen: Soft, non-tender, non-distended, BS + x 4.  Extremities: No clubbing, cyanosis or edema. 1 cm x 1 cm mildly erythematous circular rash along the lateral surface of the left lower extremity. No E migrans.    Accessory Clinical Findings:  EKG - NSR, 79 bpm, nonspecific TWI V2  Assessment & Plan:  1. Fatigue/malaise/ atypical chest pain: -Schedule for nuclear stress test to evaluate for high risk ischemia  -[facility]work all [facility] at [facility] -Consider EP follow up for device interrogation   2. Ascending thoracic aortic aneurysm: -Stable -Semiannual screening -Follow up with cardiothoracic surgery   3. Intermittent 2nd degree heart block s/p MDT PPM with reported history of syncope: -Approaching ERI  -Per EP   Christell Faith, PA-C [address], [place] [id number] [phone] [place] 07/16/2014, 3:01 PM

## 2014-07-17 ENCOUNTER — Telehealth: Payer: Self-pay

## 2014-07-17 NOTE — Telephone Encounter (Signed)
Pt states she woke up this morning has had extreme fatigue, which is starting to leave, and chest pains, also who left side is aching. Please call.

## 2014-07-17 NOTE — Telephone Encounter (Signed)
Patient stated she was extremely fatigued when she woke up  She stated her left shoulder and chest were aching ' She stated she is now feeling much better than she was at [place] patient to keep appointment for her myoview tomorrow and to call if symptoms worsen  Patient verbalized understanding

## 2014-07-18 ENCOUNTER — Ambulatory Visit: Payer: Self-pay | Admitting: Physician Assistant

## 2014-07-18 DIAGNOSIS — R079 Chest pain, unspecified: Secondary | ICD-10-CM

## 2014-07-18 NOTE — Telephone Encounter (Signed)
Patient's symptoms were short lived and she saw her PCP. She was started on Cymbalta. She is uncertain is she is going to start this medication or not. She underwent Lexiscan Myoview this morning without adverse event. Results are pending at this time. It was discussed with her that she should consider possible cardiac cath to rule out cardiac etiology for her symptoms. She will think over this while her results are pending.

## 2014-07-19 ENCOUNTER — Other Ambulatory Visit: Payer: Self-pay

## 2014-07-19 DIAGNOSIS — R0602 Shortness of breath: Secondary | ICD-10-CM

## 2014-07-19 DIAGNOSIS — R0789 Other chest pain: Secondary | ICD-10-CM

## 2014-07-23 ENCOUNTER — Telehealth: Payer: Self-pay | Admitting: Physician Assistant

## 2014-07-23 ENCOUNTER — Telehealth: Payer: Self-pay

## 2014-07-23 NOTE — Telephone Encounter (Signed)
Attempted to call patient to discuss results with her and got VM. Ok to pull me from room or page me if I', in the hospital for me to discuss these with her.

## 2014-07-23 NOTE — Telephone Encounter (Signed)
Pt would like stress test results.  

## 2014-07-23 NOTE — Telephone Encounter (Signed)
Spoke with patient and advised of results. She has been under significant amounts of stress lately as her grandchild in [place] is sick and hospitalized with PNA. She is worried about them. One of the parents is partially disabled creating further stress she states to me.   Her BP has been soft lately, 90s/50s. She has not been taking SL NTG or propranolol that would lower her BP. Later on in the day her BP comes up and she feels well and is without any complaints. However, it is mostly in the mornings that her BP is so low that she is fatigued.   Advised patient to increase her salt intake some to increase her BP. Avoid BP lowering agents.   Follow up with [place] when she gets back from [place]. She will hold off on cath at this time.

## 2014-07-23 NOTE — Telephone Encounter (Signed)
Pt called stating she just missed a call from [employer] and that we could page him in order to give results to patient.  Pt is aware and will be waiting for [employer] to call him back.  Paged him.

## 2014-07-23 NOTE — Telephone Encounter (Signed)
Please see result note 

## 2014-07-24 ENCOUNTER — Telehealth: Payer: Self-pay | Admitting: *Deleted

## 2014-07-24 NOTE — Telephone Encounter (Signed)
Grandchild has mono and feels like she could  have caught  Mono from her grandchild? Should she see her pcp and worried about her bp. Please call patient.

## 2014-07-24 NOTE — Telephone Encounter (Signed)
Spoke w/ pt.  She reports that her grandchild was dx w/ mono. She states that she visited pt during Christmas and snuggled closely w/ pt and she is concerned that pt may have contracted this.  She states that she is concerned that sx may be r/t possible mono vs cardiac issues.  Advised her to contact PCP for testing if she is concerned and let us know the results.  She verbalizes understanding and will call back w/ any further questions or concerns.

## 2014-08-07 ENCOUNTER — Ambulatory Visit (INDEPENDENT_AMBULATORY_CARE_PROVIDER_SITE_OTHER): Payer: BLUE CROSS/BLUE SHIELD | Admitting: *Deleted

## 2014-08-07 ENCOUNTER — Encounter: Payer: Self-pay | Admitting: Internal Medicine

## 2014-08-07 DIAGNOSIS — I441 Atrioventricular block, second degree: Secondary | ICD-10-CM

## 2014-08-07 NOTE — Progress Notes (Signed)
Remote pacemaker transmission.   

## 2014-08-12 LAB — MDC_IDC_ENUM_SESS_TYPE_REMOTE
Battery Voltage: 2.71 V
Brady Statistic AP VP Percent: 0 %
Brady Statistic AP VS Percent: 3 %
Brady Statistic AS VP Percent: 0 %
Brady Statistic AS VS Percent: 97 %
Date Time Interrogation Session: 20160127113230
Lead Channel Impedance Value: 462 Ohm
Lead Channel Impedance Value: 726 Ohm
Lead Channel Pacing Threshold Amplitude: 0.375 V
Lead Channel Pacing Threshold Pulse Width: 0.4 ms
Lead Channel Pacing Threshold Pulse Width: 0.4 ms
Lead Channel Sensing Intrinsic Amplitude: 2.8 mV
Lead Channel Setting Pacing Amplitude: 1.5 V
Lead Channel Setting Pacing Pulse Width: 0.4 ms
MDC IDC MSMT BATTERY IMPEDANCE: 3651 Ohm
MDC IDC MSMT BATTERY REMAINING LONGEVITY: 13 mo
MDC IDC MSMT LEADCHNL RV PACING THRESHOLD AMPLITUDE: 0.625 V
MDC IDC MSMT LEADCHNL RV SENSING INTR AMPL: 22.4 mV
MDC IDC SET LEADCHNL RV PACING AMPLITUDE: 2 V
MDC IDC SET LEADCHNL RV SENSING SENSITIVITY: 4 mV

## 2014-08-15 ENCOUNTER — Ambulatory Visit: Payer: Self-pay | Admitting: Physician Assistant

## 2014-08-20 ENCOUNTER — Encounter: Payer: Self-pay | Admitting: Cardiology

## 2014-08-28 ENCOUNTER — Ambulatory Visit (INDEPENDENT_AMBULATORY_CARE_PROVIDER_SITE_OTHER): Payer: BLUE CROSS/BLUE SHIELD | Admitting: Physician Assistant

## 2014-08-28 ENCOUNTER — Encounter: Payer: Self-pay | Admitting: Physician Assistant

## 2014-08-28 VITALS — BP 110/78 | HR 97 | Ht 64.0 in | Wt 182.5 lb

## 2014-08-28 DIAGNOSIS — T733XXD Exhaustion due to excessive exertion, subsequent encounter: Secondary | ICD-10-CM

## 2014-08-28 DIAGNOSIS — I951 Orthostatic hypotension: Secondary | ICD-10-CM

## 2014-08-28 DIAGNOSIS — I7121 Aneurysm of the ascending aorta, without rupture: Secondary | ICD-10-CM

## 2014-08-28 DIAGNOSIS — R0602 Shortness of breath: Secondary | ICD-10-CM

## 2014-08-28 DIAGNOSIS — I712 Thoracic aortic aneurysm, without rupture: Secondary | ICD-10-CM

## 2014-08-28 DIAGNOSIS — R5381 Other malaise: Secondary | ICD-10-CM

## 2014-08-28 DIAGNOSIS — R0789 Other chest pain: Secondary | ICD-10-CM

## 2014-08-28 DIAGNOSIS — Z950 Presence of cardiac pacemaker: Secondary | ICD-10-CM

## 2014-08-28 MED ORDER — NITROGLYCERIN 0.4 MG SL SUBL: 0.4000 mg | SUBLINGUAL_TABLET | SUBLINGUAL | Status: DC | PRN

## 2014-08-28 NOTE — Patient Instructions (Addendum)
Please continue your Gatorade as needed for your blood pressure  Please keep your follow up appointments as scheduled  Call or return to clinic prn if these symptoms worsen or fail to improve as anticipated.

## 2014-08-28 NOTE — Progress Notes (Signed)
Patient Name: Wendy Kelly, Wendy Kelly 09-Apr-1956, MRN [id number]  Date of Encounter: 08/28/2014  Primary Care Provider:  Golden Pop, MD Primary Cardiologist:  Dr. Rockey Situ, MD  Chief Complaint  Patient presents with  . other    1 month follow up and discuss myoview. Pt. c/o fatigue. Meds reviewed by the patient verbally.     HPI:  59 y.o. 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   She has a known stable aortic aneurysm with echo in 2013 showing EF >55%, moderately dilated ascending aorta of 4.2 cm. Repeat echo 2014 showed an EF 55-60%, likely bicuspid aortic valve, GR1DD, mild AI, aortic root of 4.1 cm, ascending aorta moderately dilated at 4.8 cm. Echo on 11/20/2013 showed an EF of 55-60%, no regional WMA, aortic valve possibly bicuspid, ascending aorta moderately dialted at 4.25 cm. Echo 07/01/2014 showed an EF of 50-55%, GR1DD, trivial aortic regurge, ascending aorta moderately dilated. CTA measured at 4.6 cm on 06/30/2014 during recent admission to [facility].   She also has frequent history of intermittent chest pain with history of nonischemic studies. Nuclear stress test 07/2012 showed exercise myocardial perfusion imagain study with no significant ischemia noted. Normal perfusion in stress and rest images. Normal EF estimated at 71%. No WMA noted. No EKG changes concerning for ischemia. Overall, low risk scan.   She was admitted to [facility] on 06/30/2014 with a 2 week history of progressive fatigue and return of her intermittent chest pain. She had been  feeling quite fatigued for 2 weeks leading up to her admission. While in church on 12/20 she suddenly felt exhausted and developed chest pain, facial pain, left arm pain, and diaphoresis. Symptoms were intermittenet every 10-15 minutes, lasting only a few seconds each time. EMS was called and transported her to [facility]. During her admission CTA confirmed stable ascending aortic aneurysm as above without dissection, echo as above, EKG without acute changes, troponin negative x 3. TSH, CBC, and bmet all normal.   In hospital follow up she continued to complain of fatigue. No angina. Her last device check was reviewed per her request which showed normal device function. Thresholds, sensing, and impedances consistent with previous measurements. Device programmed to maximize longevity. She did have 12 high ventricular rate, longest about 10 seconds. Device approaching ERI and she is due for follow up every 3 months. She was offered antidepressive therapy by her PCP, however she declined this treatment. She underwent repeat nuclear stress testing on 1/7 as above. She then went to [place] to visit her sick grandchild and apparently developed a URI. She called the office asking if she should see her PCP or see [place]. She was advised to see her PCP. Her last device device check on 2/1 showed Device function reviewed. Impedance, sensing, auto capture thresholds consistent with previous measurements. She did have 4 ventricular high rate episodes--longest was 6 seconds. Estimated longevity 13 months with range of 2 to 24 months. She is next due for device check in April with follow up with Dr. Caryl Comes in October.   She comes in stating she feels  better now than she has in a long time. She is drinking Gatorade prn and applies a small amount of salt to her food to help elevate her blood pressure from hypotensive readings. Readings at home have ranged around 90s-105/low 60s-70s. Post prandial she will at time get readings in the  80s/50s. With the above readings she feels fatigued. She reports her good zone is around 107/68. She has not had any elevated blood pressures. No further chest pain. She is happy with her progress. She still worries about the PPM battery and plans to follow up as directed.      Past Medical History  Diagnosis Date  . AV block, 2nd degree     a. 05/2005 - s/p MDT Adapta ADDR01 Dual Chamber PPM  . Syncope     a. 04/2011 Echo: EF 55-65%, No RWMA, Gr 1 DD.  . AA (aortic aneurysm)     a. 04/2011 Echo: Ao Root: 4.1cm, Asc Ao 4.7cm.  . Depression   . Expressive aphasia     a. ongoing since 04/2011 - seen by neurology - ? TIA vs. Migraine  . Facial numbness     a. ongoing since 04/2011 - seen by neurology - ? TIA vs. Migraine  . Anxiety   . Chest pain     a. 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  . Bicuspid aortic valve   :  Past Surgical History  Procedure Laterality Date  . Pacemaker insertion  2006    Medtronic  . Cataract  2004  . Tonsillectomy    . Bladder tack    . Cholecystectomy  2000  . Vaginal hysterectomy    . Abdominal wall mesh  removal    :  Social History:  The patient  reports that she has never smoked. She does not have any smokeless tobacco history on file. She reports that she does not drink alcohol or use illicit drugs.   Family History  Problem Relation Age of Onset  . COPD Mother     alive @ 60  . Stroke Father     died @ 90     Allergies:  Allergies  Allergen Reactions  . Cefdinir     unknown   . Codeine Nausea And Vomiting  . Entex Lq [Phenylephrine-Guaifenesin]   . Flagyl [Metronidazole] Swelling  . Guaifenesin & Derivatives Other (See Comments)    Night terrors  . Lactose Intolerance (Gi) Other (See Comments)    GI upset  . Naproxen Sodium     Itching/rash  . Oxycodone Nausea And Vomiting  . Penicillins     Rash/hives  . Succinylcholine Other (See Comments)    Trouble waking up  . Sulfonamide Derivatives     Rash/itching  . Tetracyclines &  Related Other (See Comments)    Unkown  . Tape Rash     Home Medications:  Current Outpatient Prescriptions  Medication Sig Dispense Refill  . B Complex Vitamins (VITAMIN B COMPLEX PO) Take 1 tablet by mouth daily.     . Cyanocobalamin (VITAMIN B 12 PO) Take by mouth daily.    . fexofenadine (ALLEGRA) 180 MG tablet Take 90 mg by mouth daily as needed.     . fluticasone (FLONASE) 50 MCG/ACT nasal spray Place 1 spray into the nose daily as needed.     Marland Kitchen ibuprofen (ADVIL,MOTRIN) 200 MG tablet Takes 2-3 tablets as needed.    . Loratadine (CLARITIN PO) Take by mouth daily.    . nitroGLYCERIN (NITROSTAT) 0.4 MG SL tablet Place  1 tablet (0.4 mg total) under the tongue every 5 (five) minutes as needed for chest pain. 25 tablet 6  . Nutritional Supplements (DHEA PO) Take 5 mg by mouth. Take two tablets in the am    . omeprazole (PRILOSEC) 20 MG capsule Take 20 mg by mouth as needed (acid reflux).     Marland Kitchen PRESCRIPTION MEDICATION Place 1 tablet vaginally 2 (two) times a week. Tuesday and Saturdays E3 1mg  and Testosterone 1mg  compound    . progesterone (PROMETRIUM) 100 MG capsule Take 100 mg by mouth daily.     . propranolol (INDERAL) 20 MG tablet Take 1 tablet (20 mg total) by mouth 3 (three) times daily as needed. (Patient taking differently: Take 20 mg by mouth 3 (three) times daily as needed (rapid heartbeat). ) 60 tablet 3  . CALCIUM-VITAMIN D PO Take 1 tablet by mouth daily.      No current facility-administered medications for this visit.     Weights: Wt Readings from Last 3 Encounters:  08/28/14 182 lb 8 oz (82.781 kg)  07/16/14 181 lb (82.101 kg)  06/30/14 176 lb 4.8 oz (79.969 kg)     Review of Systems:  As above.  All other systems reviewed and are otherwise negative except as noted above.  Physical Exam:  Blood pressure 110/78, pulse 97, height 5\' 4"  (1.626 m), weight 182 lb 8 oz (82.781 kg).  General: Pleasant, NAD Psych: Normal affect. Neuro: Alert and oriented X 3.  Moves all extremities spontaneously. HEENT: Normal  Neck: Supple without bruits or JVD. Lungs:  Resp regular and unlabored, CTA. Heart: RRR no s3, s4, or murmurs. Abdomen: Soft, non-tender, non-distended, BS + x 4.  Extremities: No clubbing, cyanosis or edema. DP/PT/Radials 2+ and equal bilaterally.   Other studies Reviewed: Additional studies/ records that were reviewed today include: nuclear stress test.   Recent Labs: 06/30/2014: BUN 13; Creatinine 0.74; Hemoglobin 13.4; Platelets 173; Potassium 3.6*; Sodium 143; TSH 3.370    Lipid Panel    Component Value Date/Time   CHOL 142 06/30/2014 1747   TRIG 88 06/30/2014 1747   HDL 50 06/30/2014 1747   CHOLHDL 2.8 06/30/2014 1747   VLDL 18 06/30/2014 1747   LDLCALC 74 06/30/2014 1747    Assessment & Plan:  1. Fatigue/malaise:  -Improved by drinking Gatorade prn and by adding salt to her food, thereby increasing her blood pressure by just a few mm Hg to a more tolerable level for her, however still at an acceptable range given her history of ascending thoracic aortic aneurysm  -Parameters given for patient to follow (anything greater than 130/80 prolonged she should call) -Recent device interrogation 08/12/2014 showed normal thresholds and device function   2. History of atypical chest pain: -Nuclear stress test 07/18/2014 without evidence of significant ischemia, wall motion abnormalities, or EKG changes concerning for ischemia. EF 40%. Overall, low risk scan  -Bloodwork all normal at [facility] during admission 07/2014  -Resolved -No further issues  2. Ascending thoracic aortic aneurysm:  -Stable  -Semiannual screening  -Follow up with cardiothoracic surgery once aneurysm reaches 5.0 cm   3. Intermittent 2nd degree heart block s/p MDT PPM with reported history of syncope:  -Approaching ERI   -Per EP   Dispo: -She has follow up already scheduled in May with Dr. Rockey Situ for her yearly recheck -Follow up as scheduled for device  check on 11/07/14 -Follow up as scheduled with Dr. Caryl Comes in October   Christell Faith, PA-C [place] [employer]  [place], [place] [address] [phone] [facility] 08/28/2014, 1:36 PM

## 2014-11-03 NOTE — Op Note (Signed)
PATIENT NAME:  Wendy Kelly, MCCLARAN MR#:  [id number] DATE OF BIRTH:  Apr 22, 1956  DATE OF PROCEDURE:  10/26/2011  PREOPERATIVE DIAGNOSIS: Pelvic pain, vaginal foreign body.  POSTOPERATIVE DIAGNOSIS: Pelvic pain, vaginal foreign body.  PROCEDURES: 1. Removal of vaginal foreign body mesh.  2. Cystoscopy.   SURGEON: Edrick Oh, MD  ASSISTANT: Malachi Paradise, MD  ANESTHESIA: General endotracheal anesthesia.   INDICATIONS: The patient is a 59 year old white female with a history of significant pelvic pain and discomfort. She also has a prior history of prolapse. She underwent a cystocele repair with mesh by another physician several years prior. She has had significant pain and discomfort related to the mesh. She presents for a hysterectomy and removal of the foreign body mesh with cystocele repair.   DESCRIPTION OF PROCEDURE: After informed consent was obtained, the patient was taken to the operating room and placed in the dorsal lithotomy position under general endotracheal anesthesia. The patient was then prepped and draped in the usual standard fashion. A 16 French Foley catheter was placed to gravity drainage. The first portion of the procedure was performed under the direction of Dr. Hassell Done DeFrancesco. This included a laparoscopic assisted vaginal hysterectomy and bilateral salpingo-oophorectomy. This will be dictated under a separate operative note. After completion of that portion of the procedure, anterior vaginal wall dissection was begun. The previously placed mesh was identified. The upper edge of the mesh was located. Dissection was undertaken over the midportion of the mesh. A plane was then developed posterior. The mesh was then incised in its midline. Attention was first turned to the left portion of the mesh. It was sharply dissected off of the underlying bladder deep into the lateral left pelvic sidewall. Adequate thickness of the bladder remained throughout. A large amount of scar  tissue was present around the mesh. Additional sharp resection was undertaken of the more superficial tissue out to its lateral aspect. The suture to the sacrospinous ligament was identified and cut. The piece of mesh was removed in its entirety. Attention was then turned to the right portion of mesh. It was similarly dissected free with a large amount of scar tissue also present. Adequate thickness of the bladder was maintained. The sacrospinous suture was identified and cut. The piece of mesh material was removed in its entirety. A cystocele repair was then performed under the direction of Dr. Enzo Bi. This will also be dictated under his initial note. At the completion of the vaginal repair, cystoscopy was then performed with the 26 French rigid cystoscope. The urethra demonstrated no bladder abnormalities. The bladder mucosa demonstrated mild erythema, on its posterior aspect. No evidence of any suture placement or bladder openings were identified. Indigo carmine had been given with good efflux noted from both ureteral orifices. The bladder was drained. The cystoscope was removed. The Foley catheter was replaced to gravity drainage. A vaginal packing was placed. The patient was returned to the supine position and awakened from general endotracheal anesthesia. She was taken to the recovery room in stable condition. There were no problems or complications. The patient tolerated the procedure well. ____________________________ Denice Bors. Jacqlyn Larsen, MD bsc:slb D: 10/28/2011 07:13:57 ET T: 10/28/2011 11:08:33 ET JOB#: [id number]  cc: Denice Bors. Jacqlyn Larsen, MD, <Dictator> Denice Bors Aarion Metzgar MD ELECTRONICALLY SIGNED 10/28/2011 15:16

## 2014-11-03 NOTE — Op Note (Signed)
PATIENT NAME:  Wendy Kelly, Wendy Kelly MR#:  [id number] DATE OF BIRTH:  11/14/1955  DATE OF PROCEDURE:  10/26/2011  PREOPERATIVE DIAGNOSES:  1. Chronic pelvic pain.  2. Status post pelvic organ prolapse with vaginal mesh.   POSTOPERATIVE DIAGNOSES:  1. Chronic pelvic pain.  2. Status post pelvic organ prolapse surgery with vaginal mesh.  3. Endometriosis.   OPERATIVE PROCEDURES:  1. LAVH, bilateral salpingo-oophorectomy.  2. Anterior colporrhaphy.  3. Excision of Nova silk vaginal mesh.  4. Cystoscopy.   SURGEON: Alanda Slim. Jeniece Hannis, MD and Dr. Jacqlyn Larsen (co-surgeons)  ANESTHESIA: General endotracheal.   INDICATIONS: Wendy Kelly is a 59 year old white female with chronic pelvic pain which has severely affected activities of daily living. She had vaginal mesh placed in a prior surgery and was extremely symptomatic since placement of the mesh. She now desires definitive treatment through surgery.   FINDINGS AT SURGERY: Findings at surgery revealed the patient to have powder burn implants consistent with endometriosis in the left ovarian fossa and left uterosacral ligament region. The left and right ovaries were atrophic in appearance. There were findings of multiple small paratubal cysts on the right fallopian tube adnexal structure. Left fallopian tube was normal. Uterus was grossly normal. The upper abdomen appeared normal. There was evidence of densely adherent vaginal which was excised in two large fragments. Post procedure cystoscopy revealed normal bilateral ureteral efflux of indigo carmine dye. The bladder was intact with an area of hyperemia noted in the region of the graft excision from the bladder.   DESCRIPTION OF PROCEDURE: Patient was brought to the Operating Room where she was placed in the supine position. General endotracheal anesthesia was induced without difficulty. She was placed in the low lithotomy position using the bumblebee stirrups. A ChloraPrep and Betadine abdominal,  perineal, intravaginal prep and drape was performed in the standard fashion. A Foley catheter was placed into the bladder and was draining clear yellow urine. A Hulka tenaculum was placed onto the cervix following cervical dilation in order to facilitate uterine manipulation during the laparoscopic portion of the hysterectomy. Following placement of the Hulka tenaculum on the cervix the laparoscopic portion of the procedure was performed. A subumbilical 5 mm vertical incision was made. Direct entry was made into the abdomen through use of the Optiview laparoscopic trocar system. No bowel or vascular injury was encountered. Two other 5 mm ports were placed in the lower abdomen under direct visualization. Using the Ace Harmonic scalpel as well as alligator graspers the procedure was performed in the standard fashion. The round ligaments were grasped and coagulated with the Harmonic scalpel. The infundibulopelvic ligaments were then likewise clamped, desiccated, and cut using the Harmonic scalpel. The cardinal broad ligament complexes were then likewise clamped, coagulated, and cut using the Harmonic scalpel. At the level of the uterosacral ligament the anterior leaf of the broad ligament was opened through both blunt and sharp dissection and the bladder was dissected off the lower uterine segment. A similar procedure was carried out on the contralateral side as well. Once adequate mobilization was performed the uterine arteries were coagulated using the Kleppinger bipolar forceps. Attention was then turned to the vaginal portion of the procedure. The patient was then placed in the dorsal lithotomy position using manipulation of the bumblebee stirrups. A weighted speculum was placed into the vagina and a double-tooth tenaculum was placed onto the cervix. The previous Hulka tenaculum was removed. A posterior colpotomy was made with Mayo scissors. The uterosacral ligaments were clamped, cut, and stick tied  using 0 Vicryl  suture. The cervix was circumscribed and the bladder and Nova silk mesh were dissected off the lower uterine segment through sharp and blunt dissection. Sequentially the remainder of the cardinal broad ligament complexes were clamped, cut, and stick tied using 0 Vicryl suture. Eventually the specimen was removed from the operative field including the uterus, tubes, and ovaries. All pedicles appeared to have adequate hemostasis. The posterior cuff was then run using 0 chromic suture in a baseball stitch manner. Next, the anterior colporrhaphy was started. Allis clamps were placed at the angle of the base of the vagina. The vagina was undermined with Metzenbaum scissors and incised in the midline. This procedure was carried out to the level of within 1 cm of the urethral meatus on the anterior vaginal mucosa. Both sharp and blunt dissection were then used to dissect the vaginal mucosa from the perivesical fascia. Once adequately mobilized, attention was then turned to removing the Mulliken silk mesh graft. Please see Dr. Bjorn Loser addendum dictation for completion of this part of the procedure.   Following the graft dissection and excision the anterior colporrhaphy was completed using vertical mattress sutures of 2-0 Vicryl. This nicely reduced the cystocele. The excess vaginal mucosa was trimmed and it was subsequently reapproximated in the midline using 0 Vicryl sutures in a simple interrupted manner. Following completion of the vaginal portion of the procedure cystoscopy was performed with the above-noted findings being verified. Vaginal packing was then placed into the vagina to help facilitate hemostasis. Finally the patient was again placed in the low lithotomy position and repeat laparoscopy was performed to verify adequate hemostasis within the pelvis. All pedicles appeared adequately hemostatic with exception of the left infundibulopelvic pedicle site which was then cauterized using Kleppinger bipolar forceps.  This provided final optimal hemostasis. The pelvis was irrigated. All irrigant fluid was aspirated. The pneumoperitoneum was then released and the incisions were reapproximated using Dermabond glue. Upon completion of the procedure all instrumentation was removed and patient was awakened, mobilized, and taken to the recovery room in satisfactory condition.   ESTIMATED BLOOD LOSS: 250 mL.  IV FLUIDS: 1700 mL.   URINE OUTPUT: 400 mL.   ____________________________ Alanda Slim. Jasman Pfeifle, MD mad:cms D: 10/26/2011 18:01:37 ET T: 10/27/2011 09:35:07 ET JOB#: [id number]  cc: Hassell Done A. Bettie Swavely, MD, <Dictator> Denice Bors. Jacqlyn Larsen, MD Alanda Slim Talli Kimmer MD ELECTRONICALLY SIGNED 11/02/2011 12:39

## 2014-11-03 NOTE — H&P (Signed)
PATIENT NAME:  Wendy Kelly, Wendy Kelly MR#:  [id number] DATE OF BIRTH:  [birth year]  DATE OF ADMISSION:  10/26/2011  CHIEF COMPLAINT: Chronic pelvic pain secondary to vaginal mesh.   HISTORY OF PRESENT ILLNESS: Wendy Kelly is Wendy 59 year old married white female, para 2-0-2-2, menopausal, on no systemic HRT therapy at this time, using vaginal estrogen nightly in preparation for surgery, who is status post anterior colporrhaphy with [place] Silk mesh as well as bilateral sacrospinous fixation with cystoscopy in the past, who presents now for surgical management of chronic pelvic pain. Since that surgery, the patient has had ongoing pelvic discomfort with significant tenderness within the vagina causing severe dyspareunia which has precluded relations with spouse. There is point tenderness on exam noted in the distal vagina in the subpubic region. Options of management in the past included use of lubricants, use of vaginal estrogen cream, versus potential for point injections which were declined. The patient now wishes to proceed with removal of the [place] as well as hysterectomy in order to impact her diffuse pelvic ache. The patient reports this pain to be burning in nature and it extends throughout the lower pelvis extending from both the left and right adnexal regions centrally to the region of the symphysis pubis. She feels as if she is swollen and on fire. The patient does have urinary frequency of 10 times Wendy day. She has nocturia x2 maximum. She does have mild urgency symptomatology. Rarely is there stress urinary incontinence. Bowel function is normal. The patient does have history of cervical dysplasia many years ago with no subsequent recent abnormality.   PAST MEDICAL HISTORY:  1. Fibromyalgia.  2. Osteopenia.  3. Mobitz type II AV block, status post pacemaker placement.  4. 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   PAST SURGICAL HISTORY:   1. Bilateral cataract surgery.  2. Laparoscopic cholecystectomy.  3. Pacemaker placement.  4. Anterior colporrhaphy with [place] Silk mesh and bilateral sacrospinous ligament suspension in 2008.  5. Tonsillectomy.   PAST OB HISTORY: Para 2-0-2-2. Spontaneous vaginal delivery x2. G1 7 pound, 14 ounce female; G2 8 pound, 9 ounce female. TAB x1. SAB x1 requiring D and C.   PAST GYN HISTORY: Menarche age 28, menopause age 13. Hormone replacement therapy was done from age 79 to 66. The patient has persistent vasomotor symptoms.   REVIEW OF SYSTEMS: The patient denies recent active illness. She is status post recent sinusitis therapy.   CURRENT MEDICATIONS:  1. Esterase vaginal cream nightly. 2. Ibuprofen p.r.n.  3. Multivitamin daily.  4. Dolovent as directed.  5. Fish Oil as directed. 6. Aspirin 81 mg daily (discontinue prior to surgery).   DRUG ALLERGIES: Penicillin, sulfa, Omnicef, oxycodone, guaifenesin, codeine, Entex, Anectine, and adhesive tape.  PHYSICAL EXAMINATION:  VITAL SIGNS: Height 5 feet 5 inches, weight 170, blood pressure 94/65, heart rate 72, BMI 28.   GENERAL: The patient is Wendy pleasant white female with normal affect. She is alert and oriented.   OROPHARYNX: Clear.   NECK: Supple. No thyromegaly or adenopathy.   LUNGS: Clear.   HEART: Regular rate and rhythm without murmur.   ABDOMEN: Soft, flat, nontender. No organomegaly. No pelvic masses. No hernias.   PELVIC: External genitalia with mild atrophic changes. BUS normal. Vagina has first degree cystocele. No significant rectocele. Cervix is without lesions. The vaginal vault has fair support. On palpation of the vagina there is significant anterior vaginal wall tenderness with vaginal mesh graft being palpated  through the vaginal mucosa; this area is extremely tender and nidus tenderness is located just at the suburethral meatus with palpable mesh being noted and tender at 4 out of 4. Mesh seems to extend with  tenderness 3 cm horizontally and 2 cm vertically on the anterior wall. The vaginal mucosa in the proximal vagina is notable for scar defect with Wendy question of small nidus of Wendy mesh fragment extruding from the mucosa. Bimanual exam reveals Wendy midplane uterus of normal size and shape. It is slightly tender 2 out of 4. There is mild cervical motion tenderness. Adnexa are clear but mildly tender.   EXTREMITIES: Without clubbing, cyanosis, or edema.   SKIN: Without rash.   MUSCULOSKELETAL: Normal.   IMPRESSION:  1. Chronic pelvic pain likely secondary to mesh from anterior colporrhaphy surgery for third degree cystocele repair.  2. Ongoing deep thrust dyspareunia. 3. Chronic pelvic pain affecting activities of daily living.  4. History of cervical dysplasia.   PLAN: LAVH with excision of Nova Silk graft and anterior colporrhaphy. Date of surgery is 10/26/2011.   CONSENT NOTE: Wendy Kelly is to undergo laparoscopic assisted vaginal hysterectomy with excision of [place] Sil[place] vaginal mesh vaginally and anterior colporrhaphy on 10/26/2011. She is to likely have cystoscopy as well. The patient is understanding of the planned procedures and is aware of and is accepting of all surgical risks which include, but are not limited to, bleeding, infection, pelvic organ injury with need for repair, blood clot disorders, anesthesia risks, and death. The patient does understand that Dr. Jacqlyn Larsen of Urology and myself will be performing the surgery as co-surgeons. All questions are answered. Informed consent is given. The patient is ready and willing to proceed with surgery as scheduled. ____________________________ Alanda Slim Kregg Cihlar, MD mad:drc D: 10/20/2011 14:22:58 ET T: 10/20/2011 15:03:49 ET JOB#: [id number] Wendy Kelly Wendy Jhoanna Heyde MD ELECTRONICALLY SIGNED 10/25/2011 14:00

## 2014-11-07 ENCOUNTER — Encounter: Payer: Self-pay | Admitting: Internal Medicine

## 2014-11-07 ENCOUNTER — Telehealth: Payer: Self-pay | Admitting: Cardiology

## 2014-11-07 ENCOUNTER — Ambulatory Visit (INDEPENDENT_AMBULATORY_CARE_PROVIDER_SITE_OTHER): Payer: BLUE CROSS/BLUE SHIELD | Admitting: *Deleted

## 2014-11-07 DIAGNOSIS — I441 Atrioventricular block, second degree: Secondary | ICD-10-CM | POA: Diagnosis not present

## 2014-11-07 NOTE — Telephone Encounter (Signed)
Spoke with pt and reminded pt of remote transmission that is due today. Pt verbalized understanding.   

## 2014-11-08 LAB — MDC_IDC_ENUM_SESS_TYPE_REMOTE
Battery Impedance: 3968 Ohm
Battery Remaining Longevity: 11 mo
Battery Voltage: 2.68 V
Brady Statistic AP VP Percent: 0 %
Brady Statistic AP VS Percent: 3 %
Date Time Interrogation Session: 20160429004010
Lead Channel Impedance Value: 704 Ohm
Lead Channel Pacing Threshold Amplitude: 0.25 V
Lead Channel Pacing Threshold Amplitude: 0.625 V
Lead Channel Setting Pacing Amplitude: 2 V
Lead Channel Setting Pacing Pulse Width: 0.4 ms
Lead Channel Setting Sensing Sensitivity: 4 mV
MDC IDC MSMT LEADCHNL RA IMPEDANCE VALUE: 475 Ohm
MDC IDC MSMT LEADCHNL RA PACING THRESHOLD PULSEWIDTH: 0.4 ms
MDC IDC MSMT LEADCHNL RA SENSING INTR AMPL: 2.8 mV
MDC IDC MSMT LEADCHNL RV PACING THRESHOLD PULSEWIDTH: 0.4 ms
MDC IDC MSMT LEADCHNL RV SENSING INTR AMPL: 8 mV
MDC IDC SET LEADCHNL RA PACING AMPLITUDE: 1.5 V
MDC IDC STAT BRADY AS VP PERCENT: 0 %
MDC IDC STAT BRADY AS VS PERCENT: 97 %

## 2014-11-08 NOTE — Progress Notes (Signed)
Remote pacemaker transmission.   

## 2014-11-29 ENCOUNTER — Encounter: Payer: Self-pay | Admitting: Cardiology

## 2014-12-03 ENCOUNTER — Ambulatory Visit (INDEPENDENT_AMBULATORY_CARE_PROVIDER_SITE_OTHER): Payer: BLUE CROSS/BLUE SHIELD | Admitting: Cardiovascular Disease

## 2014-12-03 ENCOUNTER — Encounter: Payer: Self-pay | Admitting: Cardiovascular Disease

## 2014-12-03 VITALS — BP 92/74 | HR 79 | Ht 64.0 in | Wt 181.5 lb

## 2014-12-03 DIAGNOSIS — I712 Thoracic aortic aneurysm, without rupture: Secondary | ICD-10-CM

## 2014-12-03 DIAGNOSIS — R002 Palpitations: Secondary | ICD-10-CM | POA: Diagnosis not present

## 2014-12-03 DIAGNOSIS — T733XXD Exhaustion due to excessive exertion, subsequent encounter: Secondary | ICD-10-CM

## 2014-12-03 DIAGNOSIS — I951 Orthostatic hypotension: Secondary | ICD-10-CM

## 2014-12-03 DIAGNOSIS — I441 Atrioventricular block, second degree: Secondary | ICD-10-CM

## 2014-12-03 DIAGNOSIS — R0789 Other chest pain: Secondary | ICD-10-CM

## 2014-12-03 DIAGNOSIS — Z950 Presence of cardiac pacemaker: Secondary | ICD-10-CM

## 2014-12-03 DIAGNOSIS — I7121 Aneurysm of the ascending aorta, without rupture: Secondary | ICD-10-CM

## 2014-12-03 MED ORDER — MIDODRINE HCL 10 MG PO TABS: 10.0000 mg | ORAL_TABLET | Freq: Three times a day (TID) | ORAL | Status: DC | PRN

## 2014-12-03 NOTE — Patient Instructions (Signed)
Blood pressure are running low  Consider TED hose and back brace for low blood pressures  Consider taking midodrine 1/2 pill or whole pill for symptomatic low blood pressure Take as needed, lasts for 4 to 6 hours  Please call us if you have new issues that need to be addressed before your next appt.  Your physician wants you to follow-up in: 6 months.  You will receive a reminder letter in the mail two months in advance. If you don't receive a letter, please call our office to schedule the follow-up appointment.

## 2014-12-03 NOTE — Assessment & Plan Note (Signed)
Long-standing issue. Unclear if this is related to periodic low blood pressure. In general she has good activity level

## 2014-12-03 NOTE — Assessment & Plan Note (Signed)
Prior history of atypical chest pain. None recently. Likely noncardiac

## 2014-12-03 NOTE — Assessment & Plan Note (Signed)
Followed by EP 

## 2014-12-03 NOTE — Assessment & Plan Note (Signed)
She reports periods of symptomatic orthostasis. We have prescribed midodrine for her to take as needed. Encouraged her to wear compression hose, abdominal binder

## 2014-12-03 NOTE — Progress Notes (Signed)
Patient ID: Wendy Kelly, female    DOB: March 21, 1956, 59 y.o.   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 She presents for routine followup of her chest pain and aneurysm Previous echocardiogram showing moderately dilated ascending aorta,   suspected bicuspid aortic valve.  Minimal change over the past several years, possibly up from 4.6 cm now to 4.8 cm over 5 years.  She was seen in December 2015 in the hospital for chest pain. Echocardiogram was unchanged, chest pain felt to be noncardiac.  In follow-up, she continues to have problems with low blood pressure She reports frequent systolic pressures in the 90 range, sometimes 80s. When it drops low, she has lightheadedness, dizziness, sweaty hands. When this happens she drinks fluids, eats salt, lay supine. 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 Typically relatively acute onset, always when she is standing Typically drinks lots of water during the daytime  Echocardiogram May and December 2015, relatively don't change. Results reviewed with her  EKG on today's visit shows normal sinus rhythm with rate 79 bpm, no significant ST or T-wave changes  Other past medical history  CT scan in November 2013.  4.7 cm ascending aorta at that time  She has had low potassium in the past on visits to the emergency room for chest pain  Last stress test was April 2013 performed for chest pain. Equivocal study, no large regions of ischemia, some attenuation artifact noted.  episode of tachycardia followed by chest pain  05/18/2012. Workup in the hospital was essentially negative. She had workup including carotid ultrasound that showed no significant disease.   Previous lab work shows total cholesterol 151, LDL 84, HDL 55, TSH 1.8   Allergies  Allergen Reactions  . Cefdinir     unknown   . Codeine Nausea And Vomiting  . Entex Lq [Phenylephrine-Guaifenesin]   . Flagyl [Metronidazole] Swelling  . Guaifenesin & Derivatives Other (See Comments)    Night terrors  . Lactose Intolerance (Gi) Other (See Comments)    GI upset  . Naproxen Sodium     Itching/rash  . Oxycodone Nausea And Vomiting  . Penicillins     Rash/hives  . Succinylcholine Other (See Comments)    Trouble waking up  . Sulfonamide Derivatives     Rash/itching  . Tetracyclines & Related Other (See Comments)    Unkown  . Tape Rash    Current Outpatient Prescriptions on File Prior to Visit  Medication Sig Dispense Refill  . B Complex Vitamins (VITAMIN B COMPLEX PO) Take 1 tablet by mouth daily.     Marland Kitchen CALCIUM-VITAMIN D PO Take 1 tablet by mouth daily.     . Cyanocobalamin (VITAMIN B 12 PO) Take by mouth daily.    . fexofenadine (ALLEGRA) 180 MG tablet Take 90 mg by mouth daily as needed.     . fluticasone (FLONASE) 50 MCG/ACT nasal spray Place 1 spray into the nose daily as needed.     Marland Kitchen ibuprofen (ADVIL,MOTRIN) 200 MG tablet Takes 2-3 tablets as needed.    . Loratadine (CLARITIN PO) Take by mouth daily.    Marland Kitchen  nitroGLYCERIN (NITROSTAT) 0.4 MG SL tablet Place 1 tablet (0.4 mg total) under the tongue every 5 (five) minutes as needed for chest pain. 25 tablet 6  . Nutritional Supplements (DHEA PO) Take 5 mg by mouth. Take two tablets in the am    . omeprazole (PRILOSEC) 20 MG capsule Take 20 mg by mouth as needed (acid reflux).     Marland Kitchen PRESCRIPTION MEDICATION Place 1 tablet vaginally 2 (two) times a week. Tuesday and Saturdays E3 1mg  and Testosterone 1mg  compound    . progesterone (PROMETRIUM) 100 MG capsule Take 100 mg by mouth daily.      . propranolol (INDERAL) 20 MG tablet Take 1 tablet (20 mg total) by mouth 3 (three) times daily as needed. (Patient taking differently: Take 20 mg by mouth 3 (three) times daily as needed (rapid heartbeat). ) 60 tablet 3   No current facility-administered medications on file prior to visit.    Past Medical History  Diagnosis Date  . AV block, 2nd degree     a. 05/2005 - s/p MDT Adapta ADDR01 Dual Chamber PPM  . Syncope     a. 04/2011 Echo: EF 55-65%, No RWMA, Gr 1 DD.  . AA (aortic aneurysm)     a. 04/2011 Echo: Ao Root: 4.1cm, Asc Ao 4.7cm.  . Depression   . Expressive aphasia     a. ongoing since 04/2011 - seen by neurology - ? TIA vs. Migraine  . Facial numbness     a. ongoing since 04/2011 - seen by neurology - ? TIA vs. Migraine  . Anxiety   . Chest pain     a. 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  . Bicuspid aortic valve     Past Surgical History  Procedure Laterality Date  . Pacemaker insertion  2006    [employer]  . Cataract  2004  . Tonsillectomy    . Bladder tack    . Cholecystectomy  2000  . Vaginal hysterectomy    . Abdominal wall mesh  removal      Social History  reports that she has never smoked. She does not have any smokeless tobacco history on file. She reports that she does not drink alcohol or use illicit drugs.  Family History family history includes COPD in her mother; Stroke in her father.   Review of Systems  Constitutional: Negative.   Gastrointestinal: Negative.   Musculoskeletal: Negative.   Skin: Negative.   Neurological: Negative.   Hematological: Negative.   Psychiatric/Behavioral: Negative.   All other systems reviewed and are negative.  BP 92/74 mmHg  Pulse 79  Ht 5\' 4"  (1.626 m)  Wt 181 lb 8 oz (82.328 kg)  BMI 31.14 kg/m2  Physical Exam  Constitutional: She is oriented to person, place, and time. She appears well-developed and well-nourished.  HENT:  Head: Normocephalic.  Nose: Nose normal.  Mouth/Throat: Oropharynx is clear and  moist.  Eyes: Conjunctivae are normal. Pupils are equal, round, and reactive to light.  Neck: Normal range of motion. Neck supple. No JVD present.  Cardiovascular: Normal rate, regular rhythm, S1 normal, S2 normal, normal heart sounds and intact distal pulses.  Exam reveals no gallop and no friction rub.   No murmur heard. Pulmonary/Chest: Effort normal and breath sounds normal. No respiratory distress. She has no wheezes. She has no rales. She exhibits no tenderness.  Abdominal: Soft. Bowel sounds are normal. She exhibits no distension. There is no tenderness.  Musculoskeletal: Normal range of motion. She exhibits no edema or tenderness.  Lymphadenopathy:    She has no cervical adenopathy.  Neurological: She is alert and oriented to person, place, and time. Coordination normal.  Skin: Skin is warm and dry. No rash noted. No erythema.  Psychiatric: She has a normal mood and affect. Her behavior is normal. Judgment and thought content normal.    Assessment and Plan  Nursing note and vitals reviewed.

## 2014-12-03 NOTE — Assessment & Plan Note (Signed)
No significant change on recent echocardiogram. Repeat echocardiogram in one year

## 2015-01-15 ENCOUNTER — Encounter: Payer: Self-pay | Admitting: Internal Medicine

## 2015-01-16 ENCOUNTER — Encounter: Payer: Self-pay | Admitting: Internal Medicine

## 2015-01-28 ENCOUNTER — Encounter: Payer: Self-pay | Admitting: Internal Medicine

## 2015-02-06 ENCOUNTER — Ambulatory Visit (INDEPENDENT_AMBULATORY_CARE_PROVIDER_SITE_OTHER): Payer: BLUE CROSS/BLUE SHIELD

## 2015-02-06 DIAGNOSIS — Z950 Presence of cardiac pacemaker: Secondary | ICD-10-CM

## 2015-02-06 DIAGNOSIS — I441 Atrioventricular block, second degree: Secondary | ICD-10-CM

## 2015-02-17 LAB — CUP PACEART REMOTE DEVICE CHECK
Battery Impedance: 4698 Ohm
Battery Remaining Longevity: 8 mo
Brady Statistic AS VP Percent: 0 %
Lead Channel Pacing Threshold Amplitude: 0.625 V
Lead Channel Pacing Threshold Pulse Width: 0.4 ms
Lead Channel Sensing Intrinsic Amplitude: 2.8 mV
Lead Channel Setting Pacing Amplitude: 1.5 V
Lead Channel Setting Pacing Pulse Width: 0.4 ms
Lead Channel Setting Sensing Sensitivity: 4 mV
MDC IDC MSMT BATTERY VOLTAGE: 2.65 V
MDC IDC MSMT LEADCHNL RA IMPEDANCE VALUE: 484 Ohm
MDC IDC MSMT LEADCHNL RA PACING THRESHOLD AMPLITUDE: 0.375 V
MDC IDC MSMT LEADCHNL RV IMPEDANCE VALUE: 717 Ohm
MDC IDC MSMT LEADCHNL RV PACING THRESHOLD PULSEWIDTH: 0.4 ms
MDC IDC MSMT LEADCHNL RV SENSING INTR AMPL: 8 mV
MDC IDC SESS DTM: 20160728085257
MDC IDC SET LEADCHNL RV PACING AMPLITUDE: 2 V
MDC IDC STAT BRADY AP VP PERCENT: 0 %
MDC IDC STAT BRADY AP VS PERCENT: 4 %
MDC IDC STAT BRADY AS VS PERCENT: 96 %

## 2015-02-26 ENCOUNTER — Encounter: Payer: Self-pay | Admitting: Cardiology

## 2015-03-03 ENCOUNTER — Encounter: Payer: Self-pay | Admitting: Internal Medicine

## 2015-03-13 ENCOUNTER — Telehealth: Payer: Self-pay

## 2015-03-13 ENCOUNTER — Other Ambulatory Visit: Payer: Self-pay | Admitting: Family Medicine

## 2015-03-13 NOTE — Telephone Encounter (Signed)
Refill for DHEA 5mg  Cap Take 2 capsules every morning  [place]

## 2015-04-08 ENCOUNTER — Ambulatory Visit (INDEPENDENT_AMBULATORY_CARE_PROVIDER_SITE_OTHER): Payer: BLUE CROSS/BLUE SHIELD | Admitting: Family Medicine

## 2015-04-08 ENCOUNTER — Encounter: Payer: Self-pay | Admitting: Family Medicine

## 2015-04-08 VITALS — BP 101/69 | HR 80 | Temp 97.7°F | Ht 62.3 in | Wt 181.0 lb

## 2015-04-08 DIAGNOSIS — M5442 Lumbago with sciatica, left side: Secondary | ICD-10-CM

## 2015-04-08 DIAGNOSIS — Z1239 Encounter for other screening for malignant neoplasm of breast: Secondary | ICD-10-CM | POA: Diagnosis not present

## 2015-04-08 DIAGNOSIS — Z13220 Encounter for screening for lipoid disorders: Secondary | ICD-10-CM

## 2015-04-08 DIAGNOSIS — Z8739 Personal history of other diseases of the musculoskeletal system and connective tissue: Secondary | ICD-10-CM | POA: Diagnosis not present

## 2015-04-08 DIAGNOSIS — Z0000 Encounter for general adult medical examination without abnormal findings: Secondary | ICD-10-CM | POA: Diagnosis not present

## 2015-04-08 LAB — MICROALBUMIN, URINE WAIVED
CREATININE, URINE WAIVED: 10 mg/dL (ref 10–300)
MICROALB, UR WAIVED: 10 mg/L (ref 0–19)
Microalb/Creat Ratio: <30 mg/g (ref <30)

## 2015-04-08 LAB — MICROSCOPIC EXAMINATION: Renal Epithel, UA: NONE SEEN /hpf

## 2015-04-08 MED ORDER — DICLOFENAC SODIUM 1 % TD GEL: 4.0000 g | Freq: Four times a day (QID) | TRANSDERMAL | Status: DC

## 2015-04-08 NOTE — Patient Instructions (Addendum)
When you call to schedule your mammogram (after October 28), schedule your bone density at the same time- the orders are in the computer so they'll be able to see them.   Health Maintenance Adopting a healthy lifestyle and getting preventive care can go a long way to promote health and wellness. Talk with your health care provider about what schedule of regular examinations is right for you. This is a good chance for you to check in with your provider about disease prevention and staying healthy. In between checkups, there are plenty of things you can do on your own. Experts have done a lot of research about which lifestyle changes and preventive measures are most likely to keep you healthy. Ask your health care provider for more information. WEIGHT AND DIET  Eat a healthy diet  Be sure to include plenty of vegetables, fruits, low-fat dairy products, and lean protein.  Do not eat a lot of foods high in solid fats, added sugars, or salt.  Get regular exercise. This is one of the most important things you can do for your health.  Most adults should exercise for at least 150 minutes each week. The exercise should increase your heart rate and make you sweat (moderate-intensity exercise).  Most adults should also do strengthening exercises at least twice a week. This is in addition to the moderate-intensity exercise.  Maintain a healthy weight  Body mass index (BMI) is a measurement that can be used to identify possible weight problems. It estimates body fat based on height and weight. Your health care provider can help determine your BMI and help you achieve or maintain a healthy weight.  For females 17 years of age and older:   A BMI below 18.5 is considered underweight.  A BMI of 18.5 to 24.9 is normal.  A BMI of 25 to 29.9 is considered overweight.  A BMI of 30 and above is considered obese.  Watch levels of cholesterol and blood lipids  You should start having your blood tested for  lipids and cholesterol at 60 years of age, then have this test every 5 years.  You may need to have your cholesterol levels checked more often if:  Your lipid or cholesterol levels are high.  You are older than 60 years of age.  You are at high risk for heart disease.  CANCER SCREENING   Lung Cancer  Lung cancer screening is recommended for adults 61-5 years old who are at high risk for lung cancer because of a history of smoking.  A yearly low-dose CT scan of the lungs is recommended for people who:  Currently smoke.  Have quit within the past 15 years.  Have at least a 30-pack-year history of smoking. A pack year is smoking an average of one pack of cigarettes a day for 1 year.  Yearly screening should continue until it has been 15 years since you quit.  Yearly screening should stop if you develop a health problem that would prevent you from having lung cancer treatment.  Breast Cancer  Practice breast self-awareness. This means understanding how your breasts normally appear and feel.  It also means doing regular breast self-exams. Let your health care provider know about any changes, no matter how small.  If you are in your 20s or 30s, you should have a clinical breast exam (CBE) by a health care provider every 1-3 years as part of a regular health exam.  If you are 24 or older, have a CBE every  Also consider having a breast X-ray (mammogram) every year.  If you have a family history of breast cancer, talk to your health care provider about genetic screening.  If you are at high risk for breast cancer, talk to your health care provider about having an MRI and a mammogram every year.  Breast cancer gene (BRCA) assessment is recommended for women who have family members with BRCA-related cancers. BRCA-related cancers include:  Breast.  Ovarian.  Tubal.  Peritoneal cancers.  Results of the assessment will determine the need for genetic counseling and BRCA1  and BRCA2 testing. Cervical Cancer Routine pelvic examinations to screen for cervical cancer are no longer recommended for nonpregnant women who are considered low risk for cancer of the pelvic organs (ovaries, uterus, and vagina) and who do not have symptoms. A pelvic examination may be necessary if you have symptoms including those associated with pelvic infections. Ask your health care provider if a screening pelvic exam is right for you.   The Pap test is the screening test for cervical cancer for women who are considered at risk.  If you had a hysterectomy for a problem that was not cancer or a condition that could lead to cancer, then you no longer need Pap tests.  If you are older than 65 years, and you have had normal Pap tests for the past 10 years, you no longer need to have Pap tests.  If you have had past treatment for cervical cancer or a condition that could lead to cancer, you need Pap tests and screening for cancer for at least 20 years after your treatment.  If you no longer get a Pap test, assess your risk factors if they change (such as having a new sexual partner). This can affect whether you should start being screened again.  Some women have medical problems that increase their chance of getting cervical cancer. If this is the case for you, your health care provider may recommend more frequent screening and Pap tests.  The human papillomavirus (HPV) test is another test that may be used for cervical cancer screening. The HPV test looks for the virus that can cause cell changes in the cervix. The cells collected during the Pap test can be tested for HPV.  The HPV test can be used to screen women 30 years of age and older. Getting tested for HPV can extend the interval between normal Pap tests from three to five years.  An HPV test also should be used to screen women of any age who have unclear Pap test results.  After 60 years of age, women should have HPV testing as often  as Pap tests.  Colorectal Cancer  This type of cancer can be detected and often prevented.  Routine colorectal cancer screening usually begins at 60 years of age and continues through 60 years of age.  Your health care provider may recommend screening at an earlier age if you have risk factors for colon cancer.  Your health care provider may also recommend using home test kits to check for hidden blood in the stool.  A small camera at the end of a tube can be used to examine your colon directly (sigmoidoscopy or colonoscopy). This is done to check for the earliest forms of colorectal cancer.  Routine screening usually begins at age 50.  Direct examination of the colon should be repeated every 5-10 years through 60 years of age. However, you may need to be screened more often if early forms   of precancerous polyps or small growths are found. Skin Cancer  Check your skin from head to toe regularly.  Tell your health care provider about any new moles or changes in moles, especially if there is a change in a mole's shape or color.  Also tell your health care provider if you have a mole that is larger than the size of a pencil eraser.  Always use sunscreen. Apply sunscreen liberally and repeatedly throughout the day.  Protect yourself by wearing long sleeves, pants, a wide-brimmed hat, and sunglasses whenever you are outside. HEART DISEASE, DIABETES, AND HIGH BLOOD PRESSURE   Have your blood pressure checked at least every 1-2 years. High blood pressure causes heart disease and increases the risk of stroke.  If you are between 55 years and 79 years old, ask your health care provider if you should take aspirin to prevent strokes.  Have regular diabetes screenings. This involves taking a blood sample to check your fasting blood sugar level.  If you are at a normal weight and have a low risk for diabetes, have this test once every three years after 60 years of age.  If you are overweight  and have a high risk for diabetes, consider being tested at a younger age or more often. PREVENTING INFECTION  Hepatitis B  If you have a higher risk for hepatitis B, you should be screened for this virus. You are considered at high risk for hepatitis B if:  You were born in a country where hepatitis B is common. Ask your health care provider which countries are considered high risk.  Your parents were born in a high-risk country, and you have not been immunized against hepatitis B (hepatitis B vaccine).  You have HIV or AIDS.  You use needles to inject street drugs.  You live with someone who has hepatitis B.  You have had sex with someone who has hepatitis B.  You get hemodialysis treatment.  You take certain medicines for conditions, including cancer, organ transplantation, and autoimmune conditions. Hepatitis C  Blood testing is recommended for:  Everyone born from 1945 through 1965.  Anyone with known risk factors for hepatitis C. Sexually transmitted infections (STIs)  You should be screened for sexually transmitted infections (STIs) including gonorrhea and chlamydia if:  You are sexually active and are younger than 60 years of age.  You are older than 60 years of age and your health care provider tells you that you are at risk for this type of infection.  Your sexual activity has changed since you were last screened and you are at an increased risk for chlamydia or gonorrhea. Ask your health care provider if you are at risk.  If you do not have HIV, but are at risk, it may be recommended that you take a prescription medicine daily to prevent HIV infection. This is called pre-exposure prophylaxis (PrEP). You are considered at risk if:  You are sexually active and do not regularly use condoms or know the HIV status of your partner(s).  You take drugs by injection.  You are sexually active with a partner who has HIV. Talk with your health care provider about whether  you are at high risk of being infected with HIV. If you choose to begin PrEP, you should first be tested for HIV. You should then be tested every 3 months for as long as you are taking PrEP.  PREGNANCY   If you are premenopausal and you may become pregnant, ask your health   health care provider about preconception counseling.  If you may become pregnant, take 400 to 800 micrograms (mcg) of folic acid every day.  If you want to prevent pregnancy, talk to your health care provider about birth control (contraception). OSTEOPOROSIS AND MENOPAUSE   Osteoporosis is a disease in which the bones lose minerals and strength with aging. This can result in serious bone fractures. Your risk for osteoporosis can be identified using a bone density scan.  If you are 7 years of age or older, or if you are at risk for osteoporosis and fractures, ask your health care provider if you should be screened.  Ask your health care provider whether you should take a calcium or vitamin D supplement to lower your risk for osteoporosis.  Menopause may have certain physical symptoms and risks.  Hormone replacement therapy may reduce some of these symptoms and risks. Talk to your health care provider about whether hormone replacement therapy is right for you.  HOME CARE INSTRUCTIONS   Schedule regular health, dental, and eye exams.  Stay current with your immunizations.   Do not use any tobacco products including cigarettes, chewing tobacco, or electronic cigarettes.  If you are pregnant, do not drink alcohol.  If you are breastfeeding, limit how much and how often you drink alcohol.  Limit alcohol intake to no more than 1 drink per day for nonpregnant women. One drink equals 12 ounces of beer, 5 ounces of wine, or 1 ounces of hard liquor.  Do not use street drugs.  Do not share needles.  Ask your health care provider for help if you need support or information about quitting drugs.  Tell your health care  provider if you often feel depressed.  Tell your health care provider if you have ever been abused or do not feel safe at home. Document Released: 01/11/2011 Document Revised: 11/12/2013 Document Reviewed: 05/30/2013 [facility] Patient Information 2015 [place], [place]. This information is not intended to replace advice given to you by your health care provider. Make sure you discuss any questions you have with your health care provider. Preventive Care for Adults A healthy lifestyle and preventive care can promote health and wellness. Preventive health guidelines for women include the following key practices.  A routine yearly physical is a good way to check with your health care provider about your health and preventive screening. It is a chance to share any concerns and updates on your health and to receive a thorough exam.  Visit your dentist for a routine exam and preventive care every 6 months. Brush your teeth twice a day and floss once a day. Good oral hygiene prevents tooth decay and gum disease.  The frequency of eye exams is based on your age, health, family medical history, use of contact lenses, and other factors. Follow your health care provider's recommendations for frequency of eye exams.  Eat a healthy diet. Foods like vegetables, fruits, whole grains, low-fat dairy products, and lean protein foods contain the nutrients you need without too many calories. Decrease your intake of foods high in solid fats, added sugars, and salt. Eat the right amount of calories for you.Get information about a proper diet from your health care provider, if necessary.  Regular physical exercise is one of the most important things you can do for your health. Most adults should get at least 150 minutes of moderate-intensity exercise (any activity that increases your heart rate and causes you to sweat) each week. In addition, most adults need muscle-strengthening exercises  on 2 or more days a  week.  Maintain a healthy weight. The body mass index (BMI) is a screening tool to identify possible weight problems. It provides an estimate of body fat based on height and weight. Your health care provider can find your BMI and can help you achieve or maintain a healthy weight.For adults 20 years and older:  A BMI below 18.5 is considered underweight.  A BMI of 18.5 to 24.9 is normal.  A BMI of 25 to 29.9 is considered overweight.  A BMI of 30 and above is considered obese.  Maintain normal blood lipids and cholesterol levels by exercising and minimizing your intake of saturated fat. Eat a balanced diet with plenty of fruit and vegetables. Blood tests for lipids and cholesterol should begin at age 8 and be repeated every 5 years. If your lipid or cholesterol levels are high, you are over 50, or you are at high risk for heart disease, you may need your cholesterol levels checked more frequently.Ongoing high lipid and cholesterol levels should be treated with medicines if diet and exercise are not working.  If you smoke, find out from your health care provider how to quit. If you do not use tobacco, do not start.  Lung cancer screening is recommended for adults aged 56-80 years who are at high risk for developing lung cancer because of a history of smoking. A yearly low-dose CT scan of the lungs is recommended for people who have at least a 30-pack-year history of smoking and are a current smoker or have quit within the past 15 years. A pack year of smoking is smoking an average of 1 pack of cigarettes a day for 1 year (for example: 1 pack a day for 30 years or 2 packs a day for 15 years). Yearly screening should continue until the smoker has stopped smoking for at least 15 years. Yearly screening should be stopped for people who develop a health problem that would prevent them from having lung cancer treatment.  If you are pregnant, do not drink alcohol. If you are breastfeeding, be very  cautious about drinking alcohol. If you are not pregnant and choose to drink alcohol, do not have more than 1 drink per day. One drink is considered to be 12 ounces (355 mL) of beer, 5 ounces (148 mL) of wine, or 1.5 ounces (44 mL) of liquor.  Avoid use of street drugs. Do not share needles with anyone. Ask for help if you need support or instructions about stopping the use of drugs.  High blood pressure causes heart disease and increases the risk of stroke. Your blood pressure should be checked at least every 1 to 2 years. Ongoing high blood pressure should be treated with medicines if weight loss and exercise do not work.  If you are 83-55 years old, ask your health care provider if you should take aspirin to prevent strokes.  Diabetes screening involves taking a blood sample to check your fasting blood sugar level. This should be done once every 3 years, after age 60, if you are within normal weight and without risk factors for diabetes. Testing should be considered at a younger age or be carried out more frequently if you are overweight and have at least 1 risk factor for diabetes.  Breast cancer screening is essential preventive care for women. You should practice "breast self-awareness." This means understanding the normal appearance and feel of your breasts and may include breast self-examination. Any changes detected, no matter  how small, should be reported to a health care provider. Women in their 28s and 30s should have a clinical breast exam (CBE) by a health care provider as part of a regular health exam every 1 to 3 years. After age 68, women should have a CBE every year. Starting at age 59, women should consider having a mammogram (breast X-ray test) every year. Women who have a family history of breast cancer should talk to their health care provider about genetic screening. Women at a high risk of breast cancer should talk to their health care providers about having an MRI and a mammogram  every year.  Breast cancer gene (BRCA)-related cancer risk assessment is recommended for women who have family members with BRCA-related cancers. BRCA-related cancers include breast, ovarian, tubal, and peritoneal cancers. Having family members with these cancers may be associated with an increased risk for harmful changes (mutations) in the breast cancer genes BRCA1 and BRCA2. Results of the assessment will determine the need for genetic counseling and BRCA1 and BRCA2 testing.  Routine pelvic exams to screen for cancer are no longer recommended for nonpregnant women who are considered low risk for cancer of the pelvic organs (ovaries, uterus, and vagina) and who do not have symptoms. Ask your health care provider if a screening pelvic exam is right for you.  If you have had past treatment for cervical cancer or a condition that could lead to cancer, you need Pap tests and screening for cancer for at least 20 years after your treatment. If Pap tests have been discontinued, your risk factors (such as having a new sexual partner) need to be reassessed to determine if screening should be resumed. Some women have medical problems that increase the chance of getting cervical cancer. In these cases, your health care provider may recommend more frequent screening and Pap tests.  The HPV test is an additional test that may be used for cervical cancer screening. The HPV test looks for the virus that can cause the cell changes on the cervix. The cells collected during the Pap test can be tested for HPV. The HPV test could be used to screen women aged 22 years and older, and should be used in women of any age who have unclear Pap test results. After the age of 12, women should have HPV testing at the same frequency as a Pap test.  Colorectal cancer can be detected and often prevented. Most routine colorectal cancer screening begins at the age of 36 years and continues through age 60 years. However, your health care  provider may recommend screening at an earlier age if you have risk factors for colon cancer. On a yearly basis, your health care provider may provide home test kits to check for hidden blood in the stool. Use of a small camera at the end of a tube, to directly examine the colon (sigmoidoscopy or colonoscopy), can detect the earliest forms of colorectal cancer. Talk to your health care provider about this at age 69, when routine screening begins. Direct exam of the colon should be repeated every 5-10 years through age 90 years, unless early forms of pre-cancerous polyps or small growths are found.  People who are at an increased risk for hepatitis B should be screened for this virus. You are considered at high risk for hepatitis B if:  You were born in a country where hepatitis B occurs often. Talk with your health care provider about which countries are considered high risk.  Your parents  were born in a high-risk country and you have not received a shot to protect against hepatitis B (hepatitis B vaccine).  You have HIV or AIDS.  You use needles to inject street drugs.  You live with, or have sex with, someone who has hepatitis B.  You get hemodialysis treatment.  You take certain medicines for conditions like cancer, organ transplantation, and autoimmune conditions.  Hepatitis C blood testing is recommended for all people born from [birth year] through 1965 and any individual with known risks for hepatitis C.  Practice safe sex. Use condoms and avoid high-risk sexual practices to reduce the spread of sexually transmitted infections (STIs). STIs include gonorrhea, chlamydia, syphilis, trichomonas, herpes, HPV, and human immunodeficiency virus (HIV). Herpes, HIV, and HPV are viral illnesses that have no cure. They can result in disability, cancer, and death.  You should be screened for sexually transmitted illnesses (STIs) including gonorrhea and chlamydia if:  You are sexually active and are  younger than 24 years.  You are older than 24 years and your health care provider tells you that you are at risk for this type of infection.  Your sexual activity has changed since you were last screened and you are at an increased risk for chlamydia or gonorrhea. Ask your health care provider if you are at risk.  If you are at risk of being infected with HIV, it is recommended that you take a prescription medicine daily to prevent HIV infection. This is called preexposure prophylaxis (PrEP). You are considered at risk if:  You are a heterosexual woman, are sexually active, and are at increased risk for HIV infection.  You take drugs by injection.  You are sexually active with a partner who has HIV.  Talk with your health care provider about whether you are at high risk of being infected with HIV. If you choose to begin PrEP, you should first be tested for HIV. You should then be tested every 3 months for as long as you are taking PrEP.  Osteoporosis is a disease in which the bones lose minerals and strength with aging. This can result in serious bone fractures or breaks. The risk of osteoporosis can be identified using a bone density scan. Women ages 57 years and over and women at risk for fractures or osteoporosis should discuss screening with their health care providers. Ask your health care provider whether you should take a calcium supplement or vitamin D to reduce the rate of osteoporosis.  Menopause can be associated with physical symptoms and risks. Hormone replacement therapy is available to decrease symptoms and risks. You should talk to your health care provider about whether hormone replacement therapy is right for you.  Use sunscreen. Apply sunscreen liberally and repeatedly throughout the day. You should seek shade when your shadow is shorter than you. Protect yourself by wearing long sleeves, pants, a wide-brimmed hat, and sunglasses year round, whenever you are outdoors.  Once a  month, do a whole body skin exam, using a mirror to look at the skin on your back. Tell your health care provider of new moles, moles that have irregular borders, moles that are larger than a pencil eraser, or moles that have changed in shape or color.  Stay current with required vaccines (immunizations).  Influenza vaccine. All adults should be immunized every year.  Tetanus, diphtheria, and acellular pertussis (Td, Tdap) vaccine. Pregnant women should receive 1 dose of Tdap vaccine during each pregnancy. The dose should be obtained regardless of the length  of time since the last dose. Immunization is preferred during the 27th-36th week of gestation. An adult who has not previously received Tdap or who does not know her vaccine status should receive 1 dose of Tdap. This initial dose should be followed by tetanus and diphtheria toxoids (Td) booster doses every 10 years. Adults with an unknown or incomplete history of completing a 3-dose immunization series with Td-containing vaccines should begin or complete a primary immunization series including a Tdap dose. Adults should receive a Td booster every 10 years.  Varicella vaccine. An adult without evidence of immunity to varicella should receive 2 doses or a second dose if she has previously received 1 dose. Pregnant females who do not have evidence of immunity should receive the first dose after pregnancy. This first dose should be obtained before leaving the health care facility. The second dose should be obtained 4-8 weeks after the first dose.  Human papillomavirus (HPV) vaccine. Females aged 13-26 years who have not received the vaccine previously should obtain the 3-dose series. The vaccine is not recommended for use in pregnant females. However, pregnancy testing is not needed before receiving a dose. If a female is found to be pregnant after receiving a dose, no treatment is needed. In that case, the remaining doses should be delayed until after the  pregnancy. Immunization is recommended for any person with an immunocompromised condition through the age of 32 years if she did not get any or all doses earlier. During the 3-dose series, the second dose should be obtained 4-8 weeks after the first dose. The third dose should be obtained 24 weeks after the first dose and 16 weeks after the second dose.  Zoster vaccine. One dose is recommended for adults aged 45 years or older unless certain conditions are present.  Measles, mumps, and rubella (MMR) vaccine. Adults born before [birth year] generally are considered immune to measles and mumps. Adults born in [birth year] or later should have 1 or more doses of MMR vaccine unless there is a contraindication to the vaccine or there is laboratory evidence of immunity to each of the three diseases. A routine second dose of MMR vaccine should be obtained at least 28 days after the first dose for students attending postsecondary schools, health care workers, or international travelers. People who received inactivated measles vaccine or an unknown type of measles vaccine during 1963-1967 should receive 2 doses of MMR vaccine. People who received inactivated mumps vaccine or an unknown type of mumps vaccine before 1979 and are at high risk for mumps infection should consider immunization with 2 doses of MMR vaccine. For females of childbearing age, rubella immunity should be determined. If there is no evidence of immunity, females who are not pregnant should be vaccinated. If there is no evidence of immunity, females who are pregnant should delay immunization until after pregnancy. Unvaccinated health care workers born before [birth year] who lack laboratory evidence of measles, mumps, or rubella immunity or laboratory confirmation of disease should consider measles and mumps immunization with 2 doses of MMR vaccine or rubella immunization with 1 dose of MMR vaccine.  Pneumococcal 13-valent conjugate (PCV13) vaccine. When indicated, a person  who is uncertain of her immunization history and has no record of immunization should receive the PCV13 vaccine. An adult aged 45 years or older who has certain medical conditions and has not been previously immunized should receive 1 dose of PCV13 vaccine. This PCV13 should be followed with a dose of pneumococcal polysaccharide (PPSV23) vaccine. The PPSV23  vaccine dose should be obtained at least 8 weeks after the dose of PCV13 vaccine. An adult aged 109 years or older who has certain medical conditions and previously received 1 or more doses of PPSV23 vaccine should receive 1 dose of PCV13. The PCV13 vaccine dose should be obtained 1 or more years after the last PPSV23 vaccine dose.  Pneumococcal polysaccharide (PPSV23) vaccine. When PCV13 is also indicated, PCV13 should be obtained first. All adults aged 42 years and older should be immunized. An adult younger than age 36 years who has certain medical conditions should be immunized. Any person who resides in a nursing home or long-term care facility should be immunized. An adult smoker should be immunized. People with an immunocompromised condition and certain other conditions should receive both PCV13 and PPSV23 vaccines. People with human immunodeficiency virus (HIV) infection should be immunized as soon as possible after diagnosis. Immunization during chemotherapy or radiation therapy should be avoided. Routine use of PPSV23 vaccine is not recommended for American Indians, [place] Natives, or people younger than 65 years unless there are medical conditions that require PPSV23 vaccine. When indicated, people who have unknown immunization and have no record of immunization should receive PPSV23 vaccine. One-time revaccination 5 years after the first dose of PPSV23 is recommended for people aged 19-64 years who have chronic kidney failure, nephrotic syndrome, asplenia, or immunocompromised conditions. People who received 1-2 doses of PPSV23 before age 60 years  should receive another dose of PPSV23 vaccine at age 73 years or later if at least 5 years have passed since the previous dose. Doses of PPSV23 are not needed for people immunized with PPSV23 at or after age 6 years.  Meningococcal vaccine. Adults with asplenia or persistent complement component deficiencies should receive 2 doses of quadrivalent meningococcal conjugate (MenACWY-D) vaccine. The doses should be obtained at least 2 months apart. Microbiologists working with certain meningococcal bacteria, [employer] recruits, people at risk during an outbreak, and people who travel to or live in countries with a high rate of meningitis should be immunized. A first-year college student up through age 18 years who is living in a residence hall should receive a dose if she did not receive a dose on or after her 16th birthday. Adults who have certain high-risk conditions should receive one or more doses of vaccine.  Hepatitis A vaccine. Adults who wish to be protected from this disease, have certain high-risk conditions, work with hepatitis A-infected animals, work in hepatitis A research labs, or travel to or work in countries with a high rate of hepatitis A should be immunized. Adults who were previously unvaccinated and who anticipate close contact with an international adoptee during the first 60 days after arrival in the [place] from a country with a high rate of hepatitis A should be immunized.  Hepatitis B vaccine. Adults who wish to be protected from this disease, have certain high-risk conditions, may be exposed to blood or other infectious body fluids, are household contacts or sex partners of hepatitis B positive people, are clients or workers in certain care facilities, or travel to or work in countries with a high rate of hepatitis B should be immunized.  Haemophilus influenzae type b (Hib) vaccine. A previously unvaccinated person with asplenia or sickle cell disease or having a scheduled  splenectomy should receive 1 dose of Hib vaccine. Regardless of previous immunization, a recipient of a hematopoietic stem cell transplant should receive a 3-dose series 6-12 months after her successful transplant. Hib vaccine is  not recommended for adults with HIV infection. Preventive Services / Frequency Ages 59 to 80 years  Blood pressure check.** / Every 1 to 2 years.  Lipid and cholesterol check.** / Every 5 years beginning at age 65.  Clinical breast exam.** / Every 3 years for women in their 52s and 14s.  BRCA-related cancer risk assessment.** / For women who have family members with a BRCA-related cancer (breast, ovarian, tubal, or peritoneal cancers).  Pap test.** / Every 2 years from ages 65 through 61. Every 3 years starting at age 34 through age 66 or 31 with a history of 3 consecutive normal Pap tests.  HPV screening.** / Every 3 years from ages 78 through ages 28 to 51 with a history of 3 consecutive normal Pap tests.  Hepatitis C blood test.** / For any individual with known risks for hepatitis C.  Skin self-exam. / Monthly.  Influenza vaccine. / Every year.  Tetanus, diphtheria, and acellular pertussis (Tdap, Td) vaccine.** / Consult your health care provider. Pregnant women should receive 1 dose of Tdap vaccine during each pregnancy. 1 dose of Td every 10 years.  Varicella vaccine.** / Consult your health care provider. Pregnant females who do not have evidence of immunity should receive the first dose after pregnancy.  HPV vaccine. / 3 doses over 6 months, if 67 and younger. The vaccine is not recommended for use in pregnant females. However, pregnancy testing is not needed before receiving a dose.  Measles, mumps, rubella (MMR) vaccine.** / You need at least 1 dose of MMR if you were born in 1957 or later. You may also need a 2nd dose. For females of childbearing age, rubella immunity should be determined. If there is no evidence of immunity, females who are not  pregnant should be vaccinated. If there is no evidence of immunity, females who are pregnant should delay immunization until after pregnancy.  Pneumococcal 13-valent conjugate (PCV13) vaccine.** / Consult your health care provider.  Pneumococcal polysaccharide (PPSV23) vaccine.** / 1 to 2 doses if you smoke cigarettes or if you have certain conditions.  Meningococcal vaccine.** / 1 dose if you are age 63 to 61 years and a Market researcher living in a residence hall, or have one of several medical conditions, you need to get vaccinated against meningococcal disease. You may also need additional booster doses.  Hepatitis A vaccine.** / Consult your health care provider.  Hepatitis B vaccine.** / Consult your health care provider.  Haemophilus influenzae type b (Hib) vaccine.** / Consult your health care provider. Ages 3 to 96 years  Blood pressure check.** / Every 1 to 2 years.  Lipid and cholesterol check.** / Every 5 years beginning at age 69 years.  Lung cancer screening. / Every year if you are aged 37-80 years and have a 30-pack-year history of smoking and currently smoke or have quit within the past 15 years. Yearly screening is stopped once you have quit smoking for at least 15 years or develop a health problem that would prevent you from having lung cancer treatment.  Clinical breast exam.** / Every year after age 52 years.  BRCA-related cancer risk assessment.** / For women who have family members with a BRCA-related cancer (breast, ovarian, tubal, or peritoneal cancers).  Mammogram.** / Every year beginning at age 9 years and continuing for as long as you are in good health. Consult with your health care provider.  Pap test.** / Every 3 years starting at age 45 years through age 3 or 59  years with a history of 3 consecutive normal Pap tests.  HPV screening.** / Every 3 years from ages 70 years through ages 5 to 12 years with a history of 3 consecutive normal Pap  tests.  Fecal occult blood test (FOBT) of stool. / Every year beginning at age 37 years and continuing until age 40 years. You may not need to do this test if you get a colonoscopy every 10 years.  Flexible sigmoidoscopy or colonoscopy.** / Every 5 years for a flexible sigmoidoscopy or every 10 years for a colonoscopy beginning at age 80 years and continuing until age 78 years.  Hepatitis C blood test.** / For all people born from [birth year] through 1966 and any individual with known risks for hepatitis C.  Skin self-exam. / Monthly.  Influenza vaccine. / Every year.  Tetanus, diphtheria, and acellular pertussis (Tdap/Td) vaccine.** / Consult your health care provider. Pregnant women should receive 1 dose of Tdap vaccine during each pregnancy. 1 dose of Td every 10 years.  Varicella vaccine.** / Consult your health care provider. Pregnant females who do not have evidence of immunity should receive the first dose after pregnancy.  Zoster vaccine.** / 1 dose for adults aged 67 years or older.  Measles, mumps, rubella (MMR) vaccine.** / You need at least 1 dose of MMR if you were born in 1957 or later. You may also need a 2nd dose. For females of childbearing age, rubella immunity should be determined. If there is no evidence of immunity, females who are not pregnant should be vaccinated. If there is no evidence of immunity, females who are pregnant should delay immunization until after pregnancy.  Pneumococcal 13-valent conjugate (PCV13) vaccine.** / Consult your health care provider.  Pneumococcal polysaccharide (PPSV23) vaccine.** / 1 to 2 doses if you smoke cigarettes or if you have certain conditions.  Meningococcal vaccine.** / Consult your health care provider.  Hepatitis A vaccine.** / Consult your health care provider.  Hepatitis B vaccine.** / Consult your health care provider.  Haemophilus influenzae type b (Hib) vaccine.** / Consult your health care provider. Ages 31 years and  over  Blood pressure check.** / Every 1 to 2 years.  Lipid and cholesterol check.** / Every 5 years beginning at age 39 years.  Lung cancer screening. / Every year if you are aged 36-80 years and have a 30-pack-year history of smoking and currently smoke or have quit within the past 15 years. Yearly screening is stopped once you have quit smoking for at least 15 years or develop a health problem that would prevent you from having lung cancer treatment.  Clinical breast exam.** / Every year after age 18 years.  BRCA-related cancer risk assessment.** / For women who have family members with a BRCA-related cancer (breast, ovarian, tubal, or peritoneal cancers).  Mammogram.** / Every year beginning at age 75 years and continuing for as long as you are in good health. Consult with your health care provider.  Pap test.** / Every 3 years starting at age 61 years through age 64 or 35 years with 3 consecutive normal Pap tests. Testing can be stopped between 65 and 70 years with 3 consecutive normal Pap tests and no abnormal Pap or HPV tests in the past 10 years.  HPV screening.** / Every 3 years from ages 21 years through ages 37 or 39 years with a history of 3 consecutive normal Pap tests. Testing can be stopped between 65 and 70 years with 3 consecutive normal Pap tests and no  abnormal Pap or HPV tests in the past 10 years.  Fecal occult blood test (FOBT) of stool. / Every year beginning at age 38 years and continuing until age 71 years. You may not need to do this test if you get a colonoscopy every 10 years.  Flexible sigmoidoscopy or colonoscopy.** / Every 5 years for a flexible sigmoidoscopy or every 10 years for a colonoscopy beginning at age 63 years and continuing until age 31 years.  Hepatitis C blood test.** / For all people born from [birth year] through 1966 and any individual with known risks for hepatitis C.  Osteoporosis screening.** / A one-time screening for women ages 1 years and over and  women at risk for fractures or osteoporosis.  Skin self-exam. / Monthly.  Influenza vaccine. / Every year.  Tetanus, diphtheria, and acellular pertussis (Tdap/Td) vaccine.** / 1 dose of Td every 10 years.  Varicella vaccine.** / Consult your health care provider.  Zoster vaccine.** / 1 dose for adults aged 65 years or older.  Pneumococcal 13-valent conjugate (PCV13) vaccine.** / Consult your health care provider.  Pneumococcal polysaccharide (PPSV23) vaccine.** / 1 dose for all adults aged 9 years and older.  Meningococcal vaccine.** / Consult your health care provider.  Hepatitis A vaccine.** / Consult your health care provider.  Hepatitis B vaccine.** / Consult your health care provider.  Haemophilus influenzae type b (Hib) vaccine.** / Consult your health care provider. ** Family history and personal history of risk and conditions may change your health care provider's recommendations. Document Released: 08/24/2001 Document Revised: 11/12/2013 Document Reviewed: 11/23/2010 [facility] Patient Information 2015 [place], [place]. This information is not intended to replace advice given to you by your health care provider. Make sure you discuss any questions you have with your health care provider.

## 2015-04-08 NOTE — Progress Notes (Signed)
BP 101/69 mmHg  Pulse 80  Temp(Src) 97.7 F (36.5 C)  Ht 5' 2.3" (1.582 m)  Wt 181 lb (82.101 kg)  BMI 32.80 kg/m2  SpO2 97%   Subjective:    Patient ID: Wendy Kelly, female    DOB: Nov 13, 1955, 59 y.o.   MRN: [id number]  HPI: Wendy Kelly is a 59 y.o. female presenting on 04/08/2015 for comprehensive medical examination. Current medical complaints include: low back pain- between piriformis and sciatic, aching and shooting down her left leg. Has been seeing chiropractor and stretching, but would like something to help with the pain.   Menopausal Symptoms: yes- still has hot flashes, not bothered by them  Depression Screen done today and results listed below:  Depression screen [facility] 2/9 04/08/2015  Decreased Interest 0  Down, Depressed, Hopeless 0  PHQ - 2 Score 0   The patient does not have a history of falls. I did not complete a risk assessment for falls. A plan of care for falls was not documented.   Past Medical History:  Past Medical History  Diagnosis Date  . AV block, 2nd degree     a. 05/2005 - s/p MDT Adapta ADDR01 Dual Chamber PPM  . Syncope     a. 04/2011 Echo: EF 55-65%, No RWMA, Gr 1 DD.  . AA (aortic aneurysm)     a. 04/2011 Echo: Ao Root: 4.1cm, Asc Ao 4.7cm.  . Depression   . Expressive aphasia     a. ongoing since 04/2011 - seen by neurology - ? TIA vs. Migraine  . Facial numbness     a. ongoing since 04/2011 - seen by neurology - ? TIA vs. Migraine  . Anxiety   . Chest pain     a. 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  . Bicuspid aortic valve   . Low blood pressure     Surgical History:  Past Surgical History  Procedure Laterality Date  . Pacemaker insertion  2006    Medtronic  . Cataract  2004  . Tonsillectomy    . Bladder tack    . Cholecystectomy  2000  . Vaginal hysterectomy    . Abdominal wall mesh  removal      Medications:  Current Outpatient Prescriptions on File Prior to Visit  Medication Sig  . B Complex Vitamins (VITAMIN B COMPLEX PO) Take 1  tablet by mouth daily.   Marland Kitchen CALCIUM-VITAMIN D PO Take 1 tablet by mouth daily.   . Cyanocobalamin (VITAMIN B 12 PO) Take by mouth daily.  . fexofenadine (ALLEGRA) 180 MG tablet Take 90 mg by mouth daily as needed.   . fluticasone (FLONASE) 50 MCG/ACT nasal spray Place 1 spray into the nose daily as needed.   Marland Kitchen ibuprofen (ADVIL,MOTRIN) 200 MG tablet Takes 2-3 tablets as needed.  . Loratadine (CLARITIN PO) Take by mouth daily.  . midodrine (PROAMATINE) 10 MG tablet Take 1 tablet (10 mg total) by mouth 3 (three) times daily as needed.  . nitroGLYCERIN (NITROSTAT) 0.4 MG SL tablet Place 1 tablet (0.4 mg total) under the tongue every 5 (five) minutes as needed for chest pain.  . Nutritional Supplements (DHEA PO) Take 5 mg by mouth. Take two tablets in the am  . PRESCRIPTION MEDICATION Place 1 tablet vaginally 2 (two) times a week. Tuesday and Saturdays E3 1mg  and Testosterone 1mg  compound  . progesterone (PROMETRIUM) 100 MG capsule Take 100 mg by mouth daily.   . propranolol (INDERAL) 20 MG tablet Take 1 tablet (20 mg total) by mouth  3 (three) times daily as needed. (Patient taking differently: Take 20 mg by mouth 3 (three) times daily as needed (rapid heartbeat). )   No current facility-administered medications on file prior to visit.    Allergies:  Allergies  Allergen Reactions  . Cefdinir     unknown   . Codeine Nausea And Vomiting  . Entex Lq [Phenylephrine-Guaifenesin]   . Flagyl [Metronidazole] Swelling  . Guaifenesin & Derivatives Other (See Comments)    Night terrors  . Lactose Intolerance (Gi) Other (See Comments)    GI upset  . Naproxen Sodium     Itching/rash  . Oxycodone Nausea And Vomiting  . Penicillins     Rash/hives  . Succinylcholine Other (See Comments)    Trouble waking up  . Sulfonamide Derivatives     Rash/itching  . Tetracyclines & Related Other (See Comments)    Unkown  . Tape Rash    Social History:  Social History   Social History  . Marital Status:  Married    Spouse Name: N/A  . Number of Children: N/A  . Years of Education: N/A   Occupational History  . Not on file.   Social History Main Topics  . Smoking status: Never Smoker   . Smokeless tobacco: Not on file     Comment: tobacco use -no  . Alcohol Use: No  . Drug Use: No  . Sexual Activity: Not on file   Other Topics Concern  . Not on file   Social History Narrative   Lives in [place] with husband.  Works out regularly.  Works as a Geophysicist/field seismologist - owns own business.    History  Smoking status  . Never Smoker   Smokeless tobacco  . Not on file    Comment: tobacco use -no   History  Alcohol Use No    Family History:  Family History  Problem Relation Age of Onset  . COPD Mother     alive @ 59  . Stroke Father     died @ 60    Past medical history, surgical history, medications, allergies, family history and social history reviewed with patient today and changes made to appropriate areas of the chart.   Review of Systems  Constitutional: Negative.   HENT: Negative.   Eyes: Negative.   Respiratory: Negative.   Cardiovascular: Negative.   Gastrointestinal: Negative.   Genitourinary: Negative.   Musculoskeletal: Negative.   Skin: Negative.   Neurological: Negative.   Endo/Heme/Allergies: Negative.   Psychiatric/Behavioral: Negative.     All other ROS negative except what is listed above and in the HPI.      Objective:    BP 101/69 mmHg  Pulse 80  Temp(Src) 97.7 F (36.5 C)  Ht 5' 2.3" (1.582 m)  Wt 181 lb (82.101 kg)  BMI 32.80 kg/m2  SpO2 97%  Wt Readings from Last 3 Encounters:  04/08/15 181 lb (82.101 kg)  12/03/14 181 lb 8 oz (82.328 kg)  08/28/14 182 lb 8 oz (82.781 kg)    Physical Exam  Constitutional: She is oriented to person, place, and time. She appears well-developed and well-nourished. No distress.  HENT:  Head: Normocephalic and atraumatic.  Right Ear: Hearing and external ear normal.  Left Ear: Hearing and  external ear normal.  Nose: Nose normal.  Mouth/Throat: Oropharynx is clear and moist. No oropharyngeal exudate.  Eyes: Conjunctivae, EOM and lids are normal. Pupils are equal, round, and reactive to light. Right eye exhibits no discharge. Left  eye exhibits no discharge. No scleral icterus.  Neck: Normal range of motion. Neck supple. No JVD present. No tracheal deviation present. No thyromegaly present.  Cardiovascular: Normal rate, regular rhythm, normal heart sounds and intact distal pulses.  Exam reveals no gallop and no friction rub.   No murmur heard. Pulmonary/Chest: Effort normal and breath sounds normal. No stridor. No respiratory distress. She has no wheezes. She has no rales. She exhibits no tenderness. Right breast exhibits no inverted nipple, no mass, no nipple discharge, no skin change and no tenderness. Left breast exhibits no inverted nipple, no mass, no nipple discharge, no skin change and no tenderness. Breasts are symmetrical.  Abdominal: Soft. Bowel sounds are normal. She exhibits no distension and no mass. There is no rebound and no guarding.  Genitourinary: No breast swelling, tenderness, discharge or bleeding.  Deferred with shared decision making  Musculoskeletal: Normal range of motion. She exhibits no edema or tenderness.  Lymphadenopathy:    She has no cervical adenopathy.  Neurological: She is alert and oriented to person, place, and time. She has normal reflexes. She displays normal reflexes. No cranial nerve deficit. She exhibits normal muscle tone. Coordination normal.  Skin: Skin is warm, dry and intact. No rash noted. No erythema. No pallor.  Psychiatric: She has a normal mood and affect. Her speech is normal and behavior is normal. Judgment and thought content normal. Cognition and memory are normal.  Nursing note and vitals reviewed.      Assessment & Plan:   Problem List Items Addressed This Visit    None    Visit Diagnoses    Routine general medical  examination at a health care facility    -  Primary    Screening labs to be checked when fasting. Pap not necessary due to hysterectomy. Mammo and dexa ordered. Up to date on tdap. Refuses flu and shingles.     Relevant Orders    Microalbumin, Urine Waived    UA/M w/rflx Culture, Routine    CBC with Differential/Platelet    Comprehensive metabolic panel    Lipid Panel w/o Chol/HDL Ratio    TSH    Screening for cholesterol level        To return for blood work when fasting.     Relevant Orders    Lipid Panel w/o Chol/HDL Ratio    History of osteopenia        DEXA ordered today.    Relevant Orders    DG Bone Density    Breast cancer screening        mammo ordered today    Relevant Orders    MM DIGITAL SCREENING BILATERAL    Left-sided low back pain with left-sided sciatica        Will try volatren due to stomach issues. Continue to monitor. Call if not improving.         Follow up plan: Return in about 1 year (around 04/07/2016), or Lab visit ASAP, for Physical.   LABORATORY TESTING:  - Pap smear: not applicable- s/p hysterectomy  IMMUNIZATIONS:   - Tdap: Tetanus vaccination status reviewed: last tetanus booster within 10 years. - Influenza: Refused - Zostavax vaccine: Refused  SCREENING: -Mammogram: Ordered today  - Colonoscopy: Up to date  - Bone Density: Ordered today   PATIENT COUNSELING:   Advised to take 1 mg of folate supplement per day if capable of pregnancy.   Sexuality: Discussed sexually transmitted diseases, partner selection, use of condoms, avoidance of unintended pregnancy  and contraceptive alternatives.   Advised to avoid cigarette smoking.  I discussed with the patient that most people either abstain from alcohol or drink within safe limits (<=14/week and <=4 drinks/occasion for males, <=7/weeks and <= 3 drinks/occasion for females) and that the risk for alcohol disorders and other health effects rises proportionally with the number of drinks per week  and how often a drinker exceeds daily limits.  Discussed cessation/primary prevention of drug use and availability of treatment for abuse.   Diet: Encouraged to adjust caloric intake to maintain  or achieve ideal body weight, to reduce intake of dietary saturated fat and total fat, to limit sodium intake by avoiding high sodium foods and not adding table salt, and to maintain adequate dietary potassium and calcium preferably from fresh fruits, vegetables, and low-fat dairy products.    stressed the importance of regular exercise  Injury prevention: Discussed safety belts, safety helmets, smoke detector, smoking near bedding or upholstery.   Dental health: Discussed importance of regular tooth brushing, flossing, and dental visits.    NEXT PREVENTATIVE PHYSICAL DUE IN 1 YEAR. Return in about 1 year (around 04/07/2016), or Lab visit ASAP, for Physical.

## 2015-04-10 LAB — UA/M W/RFLX CULTURE, ROUTINE

## 2015-04-14 ENCOUNTER — Other Ambulatory Visit: Payer: BLUE CROSS/BLUE SHIELD

## 2015-04-14 ENCOUNTER — Telehealth: Payer: Self-pay | Admitting: Family Medicine

## 2015-04-14 DIAGNOSIS — Z0000 Encounter for general adult medical examination without abnormal findings: Secondary | ICD-10-CM

## 2015-04-14 DIAGNOSIS — Z13220 Encounter for screening for lipoid disorders: Secondary | ICD-10-CM

## 2015-04-14 NOTE — Telephone Encounter (Signed)
Sending to PA coordinator

## 2015-04-14 NOTE — Telephone Encounter (Signed)
Wondering what's going on with PA for her cream at the pharmacy.  Per pharmacy PA is still pending.

## 2015-04-15 LAB — COMPREHENSIVE METABOLIC PANEL
A/G RATIO: 2 (ref 1.1–2.5)
ALT: 30 IU/L (ref 0–32)
AST: 20 IU/L (ref 0–40)
Albumin: 4.1 g/dL (ref 3.6–4.8)
Alkaline Phosphatase: 77 IU/L (ref 39–117)
BUN/Creatinine Ratio: 21 (ref 11–26)
BUN: 15 mg/dL (ref 8–27)
Bilirubin Total: 0.8 mg/dL (ref 0.0–1.2)
CALCIUM: 9.1 mg/dL (ref 8.7–10.3)
CO2: 22 mmol/L (ref 18–29)
Chloride: 105 mmol/L (ref 97–108)
Creatinine, Ser: 0.7 mg/dL (ref 0.57–1.00)
GFR, EST AFRICAN AMERICAN: 109 mL/min/{1.73_m2} (ref >59)
GFR, EST NON AFRICAN AMERICAN: 94 mL/min/{1.73_m2} (ref >59)
GLUCOSE: 93 mg/dL (ref 65–99)
Globulin, Total: 2.1 g/dL (ref 1.5–4.5)
Potassium: 4.4 mmol/L (ref 3.5–5.2)
Sodium: 145 mmol/L — ABNORMAL HIGH (ref 134–144)
TOTAL PROTEIN: 6.2 g/dL (ref 6.0–8.5)

## 2015-04-15 LAB — CBC WITH DIFFERENTIAL/PLATELET
BASOS: 1 %
Basophils Absolute: 0 10*3/uL (ref 0.0–0.2)
EOS (ABSOLUTE): 0.2 10*3/uL (ref 0.0–0.4)
EOS: 4 %
HEMOGLOBIN: 14.3 g/dL (ref 11.1–15.9)
Hematocrit: 43.3 % (ref 34.0–46.6)
IMMATURE GRANS (ABS): 0 10*3/uL (ref 0.0–0.1)
Immature Granulocytes: 0 %
LYMPHS: 26 %
Lymphocytes Absolute: 1.5 10*3/uL (ref 0.7–3.1)
MCH: 30.2 pg (ref 26.6–33.0)
MCHC: 33 g/dL (ref 31.5–35.7)
MCV: 92 fL (ref 79–97)
MONOCYTES: 7 %
Monocytes Absolute: 0.4 10*3/uL (ref 0.1–0.9)
NEUTROS ABS: 3.6 10*3/uL (ref 1.4–7.0)
Neutrophils: 62 %
Platelets: 183 10*3/uL (ref 150–379)
RBC: 4.73 x10E6/uL (ref 3.77–5.28)
RDW: 13.2 % (ref 12.3–15.4)
WBC: 5.7 10*3/uL (ref 3.4–10.8)

## 2015-04-15 LAB — LIPID PANEL W/O CHOL/HDL RATIO
CHOLESTEROL TOTAL: 156 mg/dL (ref 100–199)
HDL: 59 mg/dL (ref >39)
LDL CALC: 86 mg/dL (ref 0–99)
Triglycerides: 55 mg/dL (ref 0–149)
VLDL Cholesterol Cal: 11 mg/dL (ref 5–40)

## 2015-04-15 LAB — TSH: TSH: 2.76 u[IU]/mL (ref 0.450–4.500)

## 2015-04-16 ENCOUNTER — Encounter: Payer: Self-pay | Admitting: Family Medicine

## 2015-04-16 NOTE — Telephone Encounter (Signed)
I dont have a PA for her.

## 2015-04-29 ENCOUNTER — Ambulatory Visit
Admission: RE | Admit: 2015-04-29 | Discharge: 2015-04-29 | Disposition: A | Discharge status: Home / Self Care | Payer: BLUE CROSS/BLUE SHIELD | Source: Ambulatory Visit | Attending: Chiropractic Medicine | Admitting: Chiropractic Medicine

## 2015-04-29 ENCOUNTER — Other Ambulatory Visit: Payer: Self-pay | Admitting: Chiropractic Medicine

## 2015-04-29 DIAGNOSIS — M25552 Pain in left hip: Secondary | ICD-10-CM

## 2015-04-29 DIAGNOSIS — M533 Sacrococcygeal disorders, not elsewhere classified: Secondary | ICD-10-CM

## 2015-05-01 ENCOUNTER — Encounter: Payer: Self-pay | Admitting: *Deleted

## 2015-05-01 ENCOUNTER — Telehealth: Payer: Self-pay | Admitting: *Deleted

## 2015-05-01 NOTE — Telephone Encounter (Signed)
Patient c/o Palpitations:  High priority if patient c/o lightheadedness and shortness of breath.  1. How long have you been having palpitations? Dr. Melrose Nakayama (neurologist at [facility]) discovered it through converstation with patient on 05/01/15  2. Are you currently experiencing lightheadedness and shortness of breath? Symptoms were tingling numbness (face/neck down to feet on left side).  3. Have you checked your BP and heart rate? (document readings) 115/81   4. Are you experiencing any other symptoms? See above  Please call patient stat as she does have appt.'s coming up in November with Dr. Caryl Comes and Dr. Rockey Situ.

## 2015-05-02 ENCOUNTER — Ambulatory Visit (INDEPENDENT_AMBULATORY_CARE_PROVIDER_SITE_OTHER): Payer: BLUE CROSS/BLUE SHIELD | Admitting: Cardiovascular Disease

## 2015-05-02 ENCOUNTER — Encounter: Payer: Self-pay | Admitting: Cardiovascular Disease

## 2015-05-02 VITALS — BP 110/70 | HR 81 | Ht 64.0 in | Wt 180.8 lb

## 2015-05-02 DIAGNOSIS — R002 Palpitations: Secondary | ICD-10-CM | POA: Diagnosis not present

## 2015-05-02 DIAGNOSIS — Z950 Presence of cardiac pacemaker: Secondary | ICD-10-CM | POA: Diagnosis not present

## 2015-05-02 DIAGNOSIS — R202 Paresthesia of skin: Secondary | ICD-10-CM | POA: Diagnosis not present

## 2015-05-02 DIAGNOSIS — I4891 Unspecified atrial fibrillation: Secondary | ICD-10-CM | POA: Diagnosis not present

## 2015-05-02 DIAGNOSIS — I7121 Aneurysm of the ascending aorta, without rupture: Secondary | ICD-10-CM

## 2015-05-02 DIAGNOSIS — I712 Thoracic aortic aneurysm, without rupture: Secondary | ICD-10-CM

## 2015-05-02 DIAGNOSIS — G459 Transient cerebral ischemic attack, unspecified: Secondary | ICD-10-CM | POA: Insufficient documentation

## 2015-05-02 NOTE — Telephone Encounter (Signed)
Left message on machine for patient to contact the office.   

## 2015-05-02 NOTE — Assessment & Plan Note (Signed)
She was diagnosed with TIA recently of unclear etiology. She was referred for evaluation of possible underlying paroxysmal atrial fibrillation. I requested a 30 day outpatient telemetry for evaluation of this. She can follow-up with Dr.Gollan after this.

## 2015-05-02 NOTE — Progress Notes (Signed)
HPI  Wendy Kelly is a very pleasant 59 year old woman with a history of a ascending aorta aneurysm estimated at 4.7 to 4.8 cm that has been monitored with annual echocardiography, history of second-degree AV block and pacemaker placement in November 2006, history of syncope, with  episode of TIA-type symptoms with right facial droop, expressive aphasia that resolved, also with periods of intermittent chest pain, occasional nausea, anxiety. She presents for evaluation regarding possible underlying atrial fibrillation causing TIAs as requested by neurologist Dr. Melrose Nakayama Last weekend, she had a sudden episode of left sided tingling and numbness that started on the left side of the face and went all the way down to her toes. This lasted for about 1-1/2 hours. She was thought of having a TIA. She was referred for evaluation of possible underlying paroxysmal atrial fibrillation contributing to this. She reports having similar but milder symptoms in the past. She does complain of palpitations at night but no prolonged tachycardia. She reports that her chest pain has not changed from before and she denies any shortness of breath.  Allergies  Allergen Reactions  . Cefdinir     unknown   . Codeine Nausea And Vomiting  . Entex Lq [Phenylephrine-Guaifenesin]   . Flagyl [Metronidazole] Swelling  . Guaifenesin & Derivatives Other (See Comments)    Night terrors  . Lactose Intolerance (Gi) Other (See Comments)    GI upset  . Naproxen Sodium     Itching/rash  . Oxycodone Nausea And Vomiting  . Penicillins     Rash/hives  . Succinylcholine Other (See Comments)    Trouble waking up  . Sulfonamide Derivatives     Rash/itching  . Tetracyclines & Related Other (See Comments)    Unkown  . Tape Rash     Current Outpatient Prescriptions on File Prior to Visit  Medication Sig Dispense Refill  . B Complex Vitamins (VITAMIN B COMPLEX PO) Take 1 tablet by mouth daily.     Marland Kitchen CALCIUM-VITAMIN D PO Take 1  tablet by mouth daily.     . Cyanocobalamin (VITAMIN B 12 PO) Take by mouth daily.    . diclofenac sodium (VOLTAREN) 1 % GEL Apply 4 g topically 4 (four) times daily. 100 g 1  . fexofenadine (ALLEGRA) 180 MG tablet Take 90 mg by mouth daily as needed.     . fluticasone (FLONASE) 50 MCG/ACT nasal spray Place 1 spray into the nose daily as needed.     Marland Kitchen ibuprofen (ADVIL,MOTRIN) 200 MG tablet Takes 2-3 tablets as needed.    . Loratadine (CLARITIN PO) Take by mouth daily.    . midodrine (PROAMATINE) 10 MG tablet Take 1 tablet (10 mg total) by mouth 3 (three) times daily as needed. 90 tablet 3  . nitroGLYCERIN (NITROSTAT) 0.4 MG SL tablet Place 1 tablet (0.4 mg total) under the tongue every 5 (five) minutes as needed for chest pain. 25 tablet 6  . Nutritional Supplements (DHEA PO) Take 5 mg by mouth. Take two tablets in the am    . PRESCRIPTION MEDICATION Place 1 tablet vaginally 2 (two) times a week. Tuesday and Saturdays E3 1mg  and Testosterone 1mg  compound    . Probiotic Product (PROBIOTIC PO) Take by mouth.    . progesterone (PROMETRIUM) 100 MG capsule Take 100 mg by mouth daily.     . propranolol (INDERAL) 20 MG tablet Take 1 tablet (20 mg total) by mouth 3 (three) times daily as needed. (Patient taking differently: Take 20 mg by mouth 3 (three)  times daily as needed (rapid heartbeat). ) 60 tablet 3   No current facility-administered medications on file prior to visit.     Past Medical History  Diagnosis Date  . AV block, 2nd degree     a. 05/2005 - s/p MDT Adapta ADDR01 Dual Chamber PPM  . Syncope     a. 04/2011 Echo: EF 55-65%, No RWMA, Gr 1 DD.  . AA (aortic aneurysm) ([place])     a. 04/2011 Echo: Ao Root: 4.1cm, Asc Ao 4.7cm.  . Depression   . Expressive aphasia     a. ongoing since 04/2011 - seen by neurology - ? TIA vs. Migraine  . Facial numbness     a. ongoing since 04/2011 - seen by neurology - ? TIA vs. Migraine  . Anxiety   . Chest pain     a. 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  .  Bicuspid aortic valve   . Low blood pressure      Past Surgical History  Procedure Laterality Date  . Pacemaker insertion  2006    Medtronic  . Cataract  2004  . Tonsillectomy    . Bladder tack    . Cholecystectomy  2000  . Vaginal hysterectomy    . Abdominal wall mesh  removal       Family History  Problem Relation Age of Onset  . COPD Mother     alive @ 10  . Stroke Father     died @ 17     Social History   Social History  . Marital Status: Married    Spouse Name: N/A  . Number of Children: N/A  . Years of Education: N/A   Occupational History  . Not on file.   Social History Main Topics  . Smoking status: Never Smoker   . Smokeless tobacco: Not on file     Comment: tobacco use -no  . Alcohol Use: No  . Drug Use: No  . Sexual Activity: Not on file   Other Topics Concern  . Not on file   Social History Narrative   Lives in [place] with husband.  Works out regularly.  Works as a Geophysicist/field seismologist - owns own business.       PHYSICAL EXAM   BP 110/70 mmHg  Pulse 81  Ht 5\' 4"  (1.626 m)  Wt 180 lb 12 oz (81.988 kg)  BMI 31.01 kg/m2 Constitutional: She is oriented to person, place, and time. She appears well-developed and well-nourished. No distress.  HENT: No nasal discharge.  Head: Normocephalic and atraumatic.  Eyes: Pupils are equal and round. No discharge.  Neck: Normal range of motion. Neck supple. No JVD present. No thyromegaly present.  Cardiovascular: Normal rate, regular rhythm, normal heart sounds. Exam reveals no gallop and no friction rub. There is one out of 6 systolic ejection murmur in the aortic area.  Pulmonary/Chest: Effort normal and breath sounds normal. No stridor. No respiratory distress. She has no wheezes. She has no rales. She exhibits no tenderness.  Abdominal: Soft. Bowel sounds are normal. She exhibits no distension. There is no tenderness. There is no rebound and no guarding.  Musculoskeletal: Normal range of  motion. She exhibits no edema and no tenderness.  Neurological: She is alert and oriented to person, place, and time. Coordination normal.  Skin: Skin is warm and dry. No rash noted. She is not diaphoretic. No erythema. No pallor.  Psychiatric: She has a normal mood and affect. Her behavior is normal. Judgment and thought content  normal.     EKG: Normal sinus rhythm with nonspecific ST changes.   ASSESSMENT AND PLAN

## 2015-05-02 NOTE — Telephone Encounter (Signed)
S/w pt who states Saturday evening, she experienced an "episode" that she describes as left-sided tingling from arm to foot. Then radiated from her jaw to neck. Lasted 1 1/2 hours. States she spoke w/Dr. Melrose Nakayama who did not recommend CT. Pt unable to have MRI due to pacemaker implantation. Per pt, Dr. Melrose Nakayama suggested it could be TIA or questionable afib. He suggested she f/u w/Gollan sooner than her 11/17 appt. I am unable to find any notes, therefore I called [facility]. Notes faxed.  Pt indicates she has been unusally tired. Reports BP 100/70, HR 98. Reports this HR is high for her which may be contributing factor. Pt agreeable to appt today 2:45 w/Dr. Fletcher Anon.

## 2015-05-02 NOTE — Patient Instructions (Signed)
Medication Instructions:  Your physician recommends that you continue on your current medications as directed. Please refer to the Current Medication list given to you today.   Labwork: none  Testing/Procedures: Your physician has recommended that you wear an event monitor. Event monitors are medical devices that record the heart's electrical activity. Doctors most often [place] these monitors to diagnose arrhythmias. Arrhythmias are problems with the speed or rhythm of the heartbeat. The monitor is a small, portable device. You can wear one while you do your normal daily activities. This is usually used to diagnose what is causing palpitations/syncope (passing out).    Follow-Up: Your physician recommends that you schedule a follow-up appointment in: 2-3 months with Dr. Rockey Situ   Any Other Special Instructions Will Be Listed Below (If Applicable).  Cardiac Event Monitoring A cardiac event monitor is a small recording device used to help detect abnormal heart rhythms (arrhythmias). The monitor is used to record heart rhythm when noticeable symptoms such as the following occur:  Fast heartbeats (palpitations), such as heart racing or fluttering.  Dizziness.  Fainting or light-headedness.  Unexplained weakness. The monitor is wired to two electrodes placed on your chest. Electrodes are flat, sticky disks that attach to your skin. The monitor can be worn for up to 30 days. You will wear the monitor at all times, except when bathing.  HOW TO USE YOUR CARDIAC EVENT MONITOR A technician will prepare your chest for the electrode placement. The technician will show you how to place the electrodes, how to work the monitor, and how to replace the batteries. Take time to practice using the monitor before you leave the office. Make sure you understand how to send the information from the monitor to your health care provider. This requires a telephone with a landline, not a cell phone. You need  to:  Wear your monitor at all times, except when you are in water:  Do not get the monitor wet.  Take the monitor off when bathing. Do not swim or use a hot tub with it on.  Keep your skin clean. Do not put body lotion or moisturizer on your chest.  Change the electrodes daily or any time they stop sticking to your skin. You might need to use tape to keep them on.  It is possible that your skin under the electrodes could become irritated. To keep this from happening, try to put the electrodes in slightly different places on your chest. However, they must remain in the area under your left breast and in the upper right section of your chest.  Make sure the monitor is safely clipped to your clothing or in a location close to your body that your health care provider recommends.  Press the button to record when you feel symptoms of heart trouble, such as dizziness, weakness, light-headedness, palpitations, thumping, shortness of breath, unexplained weakness, or a fluttering or racing heart. The monitor is always on and records what happened slightly before you pressed the button, so do not worry about being too late to get good information.  Keep a diary of your activities, such as walking, doing chores, and taking medicine. It is especially important to note what you were doing when you pushed the button to record your symptoms. This will help your health care provider determine what might be contributing to your symptoms. The information stored in your monitor will be reviewed by your health care provider alongside your diary entries.  Send the recorded information as recommended by  your health care provider. It is important to understand that it will take some time for your health care provider to process the results.  Change the batteries as recommended by your health care provider. SEEK IMMEDIATE MEDICAL CARE IF:   You have chest pain.  You have extreme difficulty breathing or shortness  of breath.  You develop a very fast heartbeat that persists.  You develop dizziness that does not go away.  You faint or constantly feel you are about to faint.   This information is not intended to replace advice given to you by your health care provider. Make sure you discuss any questions you have with your health care provider.   Document Released: 04/06/2008 Document Revised: 07/19/2014 Document Reviewed: 12/25/2012 Elsevier Interactive Patient Education [employer].

## 2015-05-02 NOTE — Assessment & Plan Note (Signed)
She is suspected of having bicuspid aortic valve. This has been followed by serial echocardiograms.

## 2015-05-05 ENCOUNTER — Telehealth: Payer: Self-pay

## 2015-05-05 NOTE — Telephone Encounter (Signed)
Received pt OV notes vis fax from [facility]  S/w Jinny Blossom at [facility]/Dr. Melrose Nakayama to inform her pt saw Dr. Fletcher Anon 10/21. 30 day event monitor ordered. Jinny Blossom states she will update Dr. Melrose Nakayama.

## 2015-05-06 ENCOUNTER — Ambulatory Visit (INDEPENDENT_AMBULATORY_CARE_PROVIDER_SITE_OTHER): Payer: BLUE CROSS/BLUE SHIELD

## 2015-05-06 DIAGNOSIS — R202 Paresthesia of skin: Secondary | ICD-10-CM

## 2015-05-06 DIAGNOSIS — I4891 Unspecified atrial fibrillation: Secondary | ICD-10-CM

## 2015-05-08 ENCOUNTER — Telehealth: Payer: Self-pay | Admitting: Internal Medicine

## 2015-05-08 ENCOUNTER — Encounter: Payer: BLUE CROSS/BLUE SHIELD | Admitting: Internal Medicine

## 2015-05-08 NOTE — Telephone Encounter (Signed)
Per verbal from Dr. Caryl Comes, cardiac report normal. Forward to Dr. Rockey Situ to make him aware of pt symptoms

## 2015-05-08 NOTE — Telephone Encounter (Signed)
Per verbal from Dr. Rockey Situ, no cardiology intervention at this time. Continue to monitor.  Notified pt that I have spoken w/Dr. Rockey Situ and Dr. Caryl Comes who do not recommend any intervention at this time.  Continue to monitor. Pt verbalized understanding

## 2015-05-08 NOTE — Telephone Encounter (Signed)
° °  1. How long have you been having palpitations?   5 episodes since August happens at night    Last episode  this morning strong loud heart beat wakes her up w/ L arm tingles feels like fast HR but checked in 70's   2. Are you currently experiencing lightheadedness and shortness of breath?  Maybe sob due to being scared at time of event   3. Have you checked your BP and heart rate?  Last episode this am    3:42 am 91/62   HR 67  4. Are you experiencing any other symptoms? L arm tingling   Episode started at 3:10 lasted 1-1.5 hours

## 2015-05-08 NOTE — Telephone Encounter (Signed)
S/w Dr. Caryl Comes who requests monitoring report for pt at time of symptoms. S/w  Debbie at [employer] who states they will upload info.  Printed report and faxed to Whigham at [place]. Awaiting response from MD

## 2015-05-08 NOTE — Telephone Encounter (Signed)
S/w pt who states she was asleep last night when she was awakened by strong heart beat. She checked her heart rate which was in the 70's. Experienced left arm tingling for a short period of time. Strong heart beat lasted 1 1/2 hours. Resolved on its own. Pt was seen by Dr. Fletcher Anon 10/21 for palpitations at which time a 30 day event monitor was ordered. She has been wearing it for one day. She is a patient of Dr. Rockey Situ and Dr. Caryl Comes. She is unsure if something is going on with her pacemaker as she has had 5 episodes such as these since August. Pacemaker implantation 10 years ago. Denies any other symptoms. While we are on the phone, she is talking to me while riding her stationary bike. Asymptomatic at this time. Forward to [employer]

## 2015-05-13 NOTE — Telephone Encounter (Signed)
This encounter was created in error - please disregard.

## 2015-05-19 ENCOUNTER — Ambulatory Visit
Admission: RE | Admit: 2015-05-19 | Discharge: 2015-05-19 | Disposition: A | Discharge status: Home / Self Care | Payer: BLUE CROSS/BLUE SHIELD | Source: Ambulatory Visit | Attending: Family Medicine | Admitting: Family Medicine

## 2015-05-19 ENCOUNTER — Encounter: Payer: Self-pay | Admitting: Family Medicine

## 2015-05-19 DIAGNOSIS — Z1239 Encounter for other screening for malignant neoplasm of breast: Secondary | ICD-10-CM

## 2015-05-19 DIAGNOSIS — Z8739 Personal history of other diseases of the musculoskeletal system and connective tissue: Secondary | ICD-10-CM

## 2015-05-19 DIAGNOSIS — Z1231 Encounter for screening mammogram for malignant neoplasm of breast: Secondary | ICD-10-CM | POA: Insufficient documentation

## 2015-05-19 DIAGNOSIS — Z13820 Encounter for screening for osteoporosis: Secondary | ICD-10-CM | POA: Diagnosis present

## 2015-05-20 ENCOUNTER — Encounter: Payer: Self-pay | Admitting: Internal Medicine

## 2015-05-20 ENCOUNTER — Ambulatory Visit (INDEPENDENT_AMBULATORY_CARE_PROVIDER_SITE_OTHER): Payer: BLUE CROSS/BLUE SHIELD | Admitting: Internal Medicine

## 2015-05-20 ENCOUNTER — Encounter: Payer: Self-pay | Admitting: *Deleted

## 2015-05-20 VITALS — BP 90/62 | HR 75 | Ht 63.0 in | Wt 175.0 lb

## 2015-05-20 DIAGNOSIS — Z01812 Encounter for preprocedural laboratory examination: Secondary | ICD-10-CM

## 2015-05-20 DIAGNOSIS — R002 Palpitations: Secondary | ICD-10-CM | POA: Diagnosis not present

## 2015-05-20 DIAGNOSIS — Z950 Presence of cardiac pacemaker: Secondary | ICD-10-CM | POA: Diagnosis not present

## 2015-05-20 DIAGNOSIS — I441 Atrioventricular block, second degree: Secondary | ICD-10-CM | POA: Diagnosis not present

## 2015-05-20 LAB — CUP PACEART INCLINIC DEVICE CHECK
Battery Impedance: 6614 Ohm
Battery Voltage: 2.63 V
Implantable Lead Location: 753859
Implantable Lead Model: 4092
Implantable Lead Model: 4592
Lead Channel Setting Sensing Sensitivity: 4 mV
MDC IDC LEAD IMPLANT DT: 20061128
MDC IDC LEAD IMPLANT DT: 20061128
MDC IDC LEAD LOCATION: 753860
MDC IDC MSMT LEADCHNL RA IMPEDANCE VALUE: 67 Ohm
MDC IDC MSMT LEADCHNL RV IMPEDANCE VALUE: 732 Ohm
MDC IDC SESS DTM: 20161108144354
MDC IDC SET LEADCHNL RV PACING AMPLITUDE: 2.25 V
MDC IDC SET LEADCHNL RV PACING PULSEWIDTH: 0.4 ms
MDC IDC STAT BRADY RV PERCENT PACED: 0 %

## 2015-05-20 NOTE — Progress Notes (Signed)
skf      Patient Care Team: Valerie Roys, DO as PCP - General (Family Medicine)   HPI  Wendy Kelly is a 59 y.o. female Seen in followup for a pacemaker implanted for syncope with intermittent second degree heart block. She received a Medtronic adapa device in 2006   Her device is reverted to VVI   She has a history of normal left ventricular function by recent echo.   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  Past Medical History  Diagnosis Date  . AV block, 2nd degree     a. 05/2005 - s/p MDT Adapta ADDR01 Dual Chamber PPM  . Syncope     a. 04/2011 Echo: EF 55-65%, No RWMA, Gr 1 DD.  . AA (aortic aneurysm) ([place])     a. 04/2011 Echo: Ao Root: 4.1cm, Asc Ao 4.7cm.  . Depression   . Expressive aphasia     a. ongoing since 04/2011 - seen by neurology - ? TIA vs. Migraine  . Facial numbness     a. ongoing since 04/2011 - seen by neurology - ? TIA vs. Migraine  . Anxiety   . Chest pain     a. 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  . Bicuspid aortic valve   . Low blood pressure     Past Surgical History  Procedure Laterality Date  . Pacemaker insertion  2006    Medtronic  . Cataract  2004  . Tonsillectomy    . Bladder tack    . Cholecystectomy  2000  . Vaginal hysterectomy    . Abdominal wall mesh  removal      Current Outpatient Prescriptions  Medication Sig Dispense Refill  . aspirin 81 MG tablet Take 81 mg by mouth daily.    . B Complex Vitamins (VITAMIN B COMPLEX PO) Take 1 tablet by mouth daily.     . Calcium 75 MG TABS Take by mouth daily.    . Cyanocobalamin (VITAMIN B 12 PO) Take by mouth daily.     . fexofenadine (ALLEGRA) 180 MG tablet Take 90 mg by mouth daily as needed.     . fluticasone (FLONASE) 50 MCG/ACT nasal spray Place 1 spray into the nose daily as needed.     Marland Kitchen ibuprofen (ADVIL,MOTRIN) 200 MG tablet Takes 2-3 tablets as needed.    . Loratadine (CLARITIN PO) Take by mouth daily.    . nitroGLYCERIN (NITROSTAT) 0.4 MG SL tablet Place 1 tablet (0.4 mg total) under the tongue every 5 (five) minutes as needed for chest pain. 25 tablet 6  . Nutritional Supplements (DHEA PO) Take 5 mg by mouth. Take two tablets in the am    . PRESCRIPTION MEDICATION Place 1 tablet vaginally 2 (two) times a week. Tuesday and Saturdays E3 1mg  and Testosterone 1mg  compound    . Probiotic Product (PROBIOTIC PO) Take by mouth.    . progesterone (PROMETRIUM) 100 MG capsule Take 100 mg by mouth daily.     . propranolol (INDERAL) 20 MG tablet Take 1 tablet (20 mg total) by mouth 3 (  three) times daily as needed. (Patient taking differently: Take 20 mg by mouth 3 (three) times daily as needed (rapid heartbeat). ) 60 tablet 3   No current facility-administered medications for this visit.    Allergies  Allergen Reactions  . Cefdinir     unknown   . Codeine Nausea And Vomiting  . Entex Lq [Phenylephrine-Guaifenesin]   . Flagyl [Metronidazole] Swelling  . Guaifenesin & Derivatives Other (See Comments)    Night terrors  . Lactose Intolerance (Gi) Other (See Comments)    GI upset  . Naproxen Sodium     Itching/rash  . Oxycodone Nausea And Vomiting  . Penicillins     Rash/hives  . Succinylcholine Other (See Comments)    Trouble waking up  . Sulfonamide Derivatives     Rash/itching  . Tetracyclines & Related Other (See Comments)    Unkown  . Tape Rash    Review of Systems negative except from HPI and PMH  Physical Exam BP 90/62 mmHg  Pulse 75  Ht 5\' 3"  (1.6 m)  Wt 175 lb (79.379 kg)  BMI 31.01 kg/m2 Well developed and well nourished in no acute distress HENT normal E scleral and icterus  clear Neck Supple JVP flat; carotids brisk and full Clear to ausculation Device pocket well healed; without hematoma or erythema.  There is no tethering Regular rate and rhythm, no murmurs gallops or rub Soft with active bowel sounds No clubbing cyanosis no Edema Alert and oriented, grossly normal motor and sensory function Skin Warm and Dry  ECG demonstrates sinus rhythm at 63 Interval 17/09/38 Otherwise normal  Assessment and  Plan  Syncope  Intermittent second degree heart block-apparently nocturnal  Pacemaker-Medtronic  Hypotension  ? TIA  Ascending aortic aneurysm  Her device has reached ERI. It has prevented syncope. We will replace it.  We have reviewed the benefits and risks of generator replacement.  These include but are not limited to lead fracture and infection.  The patient understands, agrees and is willing to proceed.    I reviewed her CT images for her aortic aneurysm; I discussed this with Dr. Deidre Ala. He will clarify with her the strategy for ongoing monitoring  She continues with problems with episodic hypotension.  This probably precludes the aggressive use of vasomotor agents i.e. ProAmatine. We did discuss nonpharmacological tools i.e. Compression devices. She notes that she was intolerant of compression hose because of hypertension (???).  We discussed the use of isometric contraction.  Replacing her pacemaker battery will allow for recording of atrial arrhythmia. We'll stop her 30 day monitor.

## 2015-05-20 NOTE — Patient Instructions (Signed)
Medication Instructions: - no changes  Labwork: - Your physician recommends that you return for pre- procedure lab work : Wednesday/ Thursday of next week  Procedures/Testing: - Your physician has recommended that you have a pacemaker generator (battery) change. Please see the instruction sheet given to you today for more information.  Follow-Up: - Your physician recommends that you schedule a follow-up appointment in: 10-14 days (from 06/04/15) for a wound check with the device clinic in the [place] office.  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 Take this all the way to [address]- take a left [address]- the hospital entrance will be on the left- go just past that and the office entrance is on the right. The parking deck is located to the back of the building. You will take the elevator to the 3 rd floor and this will put you right out at the front desk. **   Any Additional Special Instructions Will Be Listed Below (If Applicable). - none

## 2015-05-28 ENCOUNTER — Other Ambulatory Visit: Payer: BLUE CROSS/BLUE SHIELD

## 2015-05-28 DIAGNOSIS — Z01812 Encounter for preprocedural laboratory examination: Secondary | ICD-10-CM

## 2015-05-28 DIAGNOSIS — I441 Atrioventricular block, second degree: Secondary | ICD-10-CM

## 2015-05-29 ENCOUNTER — Encounter: Payer: Self-pay | Admitting: Cardiovascular Disease

## 2015-05-29 ENCOUNTER — Ambulatory Visit (INDEPENDENT_AMBULATORY_CARE_PROVIDER_SITE_OTHER): Payer: BLUE CROSS/BLUE SHIELD | Admitting: Cardiovascular Disease

## 2015-05-29 VITALS — BP 104/66 | HR 72 | Ht 63.0 in | Wt 173.5 lb

## 2015-05-29 DIAGNOSIS — I712 Thoracic aortic aneurysm, without rupture: Secondary | ICD-10-CM

## 2015-05-29 DIAGNOSIS — I7121 Aneurysm of the ascending aorta, without rupture: Secondary | ICD-10-CM

## 2015-05-29 DIAGNOSIS — G459 Transient cerebral ischemic attack, unspecified: Secondary | ICD-10-CM

## 2015-05-29 DIAGNOSIS — I951 Orthostatic hypotension: Secondary | ICD-10-CM

## 2015-05-29 DIAGNOSIS — R0789 Other chest pain: Secondary | ICD-10-CM

## 2015-05-29 DIAGNOSIS — I441 Atrioventricular block, second degree: Secondary | ICD-10-CM | POA: Diagnosis not present

## 2015-05-29 LAB — CBC WITH DIFFERENTIAL/PLATELET
BASOS: 1 %
Basophils Absolute: 0 10*3/uL (ref 0.0–0.2)
EOS (ABSOLUTE): 0.1 10*3/uL (ref 0.0–0.4)
Eos: 1 %
HEMOGLOBIN: 14.1 g/dL (ref 11.1–15.9)
Hematocrit: 40.9 % (ref 34.0–46.6)
IMMATURE GRANS (ABS): 0 10*3/uL (ref 0.0–0.1)
Immature Granulocytes: 0 %
LYMPHS: 28 %
Lymphocytes Absolute: 1.6 10*3/uL (ref 0.7–3.1)
MCH: 30.5 pg (ref 26.6–33.0)
MCHC: 34.5 g/dL (ref 31.5–35.7)
MCV: 89 fL (ref 79–97)
MONOCYTES: 7 %
Monocytes Absolute: 0.4 10*3/uL (ref 0.1–0.9)
NEUTROS ABS: 3.5 10*3/uL (ref 1.4–7.0)
Neutrophils: 63 %
Platelets: 181 10*3/uL (ref 150–379)
RBC: 4.62 x10E6/uL (ref 3.77–5.28)
RDW: 13.5 % (ref 12.3–15.4)
WBC: 5.5 10*3/uL (ref 3.4–10.8)

## 2015-05-29 LAB — BASIC METABOLIC PANEL
BUN/Creatinine Ratio: 18 (ref 11–26)
BUN: 14 mg/dL (ref 8–27)
CALCIUM: 9.4 mg/dL (ref 8.7–10.3)
CO2: 22 mmol/L (ref 18–29)
Chloride: 104 mmol/L (ref 97–106)
Creatinine, Ser: 0.77 mg/dL (ref 0.57–1.00)
GFR, EST AFRICAN AMERICAN: 97 mL/min/{1.73_m2} (ref >59)
GFR, EST NON AFRICAN AMERICAN: 84 mL/min/{1.73_m2} (ref >59)
Glucose: 100 mg/dL — ABNORMAL HIGH (ref 65–99)
Potassium: 3.9 mmol/L (ref 3.5–5.2)
Sodium: 142 mmol/L (ref 136–144)

## 2015-05-29 NOTE — Assessment & Plan Note (Signed)
Currently does not want fludrocortisone or midodrine Prefers compression hose, fluid hydration

## 2015-05-29 NOTE — Assessment & Plan Note (Signed)
No further episodes of chest pain. No further workup at this time

## 2015-05-29 NOTE — Assessment & Plan Note (Signed)
Tolerating aspirin, no further episodes

## 2015-05-29 NOTE — Assessment & Plan Note (Signed)
Scheduled for battery change out next week for ERI

## 2015-05-29 NOTE — Patient Instructions (Addendum)
You are doing well. No medication changes were made.  We will set up echo after 07/02/15 for ascending aorta aneurysm  Date & time:___________________________________________  Please call us if you have new issues that need to be addressed before your next appt.  Your physician wants you to follow-up in: 12 months.  You will receive a reminder letter in the mail two months in advance. If you don't receive a letter, please call our office to schedule the follow-up appointment.   Echocardiogram An echocardiogram, or echocardiography, uses sound waves (ultrasound) to produce an image of your heart. The echocardiogram is simple, painless, obtained within a short period of time, and offers valuable information to your health care provider. The images from an echocardiogram can provide information such as:  Evidence of coronary artery disease (CAD).  Heart size.  Heart muscle function.  Heart valve function.  Aneurysm detection.  Evidence of a past heart attack.  Fluid buildup around the heart.  Heart muscle thickening.  Assess heart valve function. LET [facility] CARE PROVIDER KNOW ABOUT:  Any allergies you have.  All medicines you are taking, including vitamins, herbs, eye drops, creams, and over-the-counter medicines.  Previous problems you or members of your family have had with the use of anesthetics.  Any blood disorders you have.  Previous surgeries you have had.  Medical conditions you have.  Possibility of pregnancy, if this applies. BEFORE THE PROCEDURE  No special preparation is needed. Eat and drink normally.  PROCEDURE   In order to produce an image of your heart, gel will be applied to your chest and a wand-like tool (transducer) will be moved over your chest. The gel will help transmit the sound waves from the transducer. The sound waves will harmlessly bounce off your heart to allow the heart images to be captured in real-time motion. These images will  then be recorded.  You may need an IV to receive a medicine that improves the quality of the pictures. AFTER THE PROCEDURE You may return to your normal schedule including diet, activities, and medicines, unless your health care provider tells you otherwise.   This information is not intended to replace advice given to you by your health care provider. Make sure you discuss any questions you have with your health care provider.   Document Released: 06/25/2000 Document Revised: 07/19/2014 Document Reviewed: 03/05/2013 Elsevier Interactive Patient Education [employer].

## 2015-05-29 NOTE — Assessment & Plan Note (Signed)
We will schedule repeat echocardiogram at her convenience, aneurysm has been stable at 4.6 up to 4.8 cm since 2010

## 2015-05-29 NOTE — H&P (Signed)
Patient ID: Rhyanne Krings, female    DOB: 03-26-1956, 59 y.o.   MRN: [id number]  HPI Comments: [name] is a very pleasant 59 year old woman with a history of a ascending aorta aneurysm estimated at 4.7 to 4.8 cm that has been monitored with annual echocardiography, history of second-degree AV block and pacemaker placement in November 2006, history of syncope, with  episode of TIA-type symptoms with right facial droop, expressive aphasia that resolved, also with periods of intermittent chest pain, occasional nausea, anxiety. She presents for routine followup of her chest pain and aneurysm Previous echocardiogram showing moderately dilated ascending aorta,   suspected bicuspid aortic valve.  Minimal change over the past several years, possibly up from 4.6 cm now to 4.8 cm over 5 years.  She was seen in December 2015 in the hospital for chest pain. Echocardiogram was unchanged, chest pain felt to be noncardiac.  In follow-up today, she reports having lightheadedness. She feels this is secondary to her pacemaker which is ERI. Scheduled to have battery change next week Blood pressure continues to run low. She does not want to take fludrocortisone or midodrine at this time Might take it if she is symptomatic Trying to drink plenty of fluids, salt loading Not wearing compression hose or stomach Binder  Other past medical history frequent systolic pressures in the 90 range, sometimes 80s. When it drops low, she has lightheadedness, dizziness, sweaty hands. Typically drinks lots of water during the daytime  Echocardiogram May and December 2015, relatively don't change. Results reviewed with her    CT scan in November 2013.  4.7 cm ascending aorta at that time  She has had low potassium in the past on visits to the emergency room for chest pain  Last stress test was April 2013 performed for chest pain. Equivocal study, no large regions of ischemia, some attenuation artifact noted.  episode of  tachycardia followed by chest pain 05/18/2012. Workup in the hospital was essentially negative. She had workup including carotid ultrasound that showed no significant disease.   Previous lab work shows total cholesterol 151, LDL 84, HDL 55, TSH 1.8   Allergies  Allergen Reactions  . Cefdinir     unknown   . Codeine Nausea And Vomiting  . Entex Lq [Phenylephrine-Guaifenesin]   . Flagyl [Metronidazole] Swelling  . Guaifenesin & Derivatives Other (See Comments)    Night terrors  . Lactose Intolerance (Gi) Other (See Comments)    GI upset  . Naproxen Sodium     Itching/rash  . Oxycodone Nausea And Vomiting  . Penicillins     Rash/hives  . Succinylcholine Other (See Comments)    Trouble waking up  . Sulfonamide Derivatives     Rash/itching  . Tetracyclines & Related Other (See Comments)    Unkown  . Tape Rash    Current Outpatient Prescriptions on File Prior to Visit  Medication Sig Dispense Refill  . aspirin 81 MG tablet Take 81 mg by mouth daily.    . B Complex Vitamins (VITAMIN B COMPLEX PO) Take 1 tablet by mouth daily.     . Calcium 75 MG TABS Take by mouth daily.    . Cyanocobalamin (VITAMIN B 12 PO) Take by mouth daily.    . fexofenadine (ALLEGRA) 180 MG tablet Take 90 mg by mouth daily as needed.     . fluticasone (FLONASE) 50 MCG/ACT nasal spray Place 1 spray into the nose daily as needed.     Marland Kitchen ibuprofen (ADVIL,MOTRIN) 200 MG tablet Takes  2-3 tablets as needed.    . Loratadine (CLARITIN PO) Take by mouth daily.    . nitroGLYCERIN (NITROSTAT) 0.4 MG SL tablet Place 1 tablet (0.4 mg total) under the tongue every 5 (five) minutes as needed for chest pain. 25 tablet 6  . Nutritional Supplements (DHEA PO) Take 5 mg by mouth. Take two tablets in the am    . PRESCRIPTION MEDICATION Place 1 tablet vaginally 2 (two) times a week. Tuesday and Saturdays E3 1mg  and Testosterone 1mg  compound    . Probiotic Product (PROBIOTIC PO) Take by mouth.    . progesterone (PROMETRIUM) 100  MG capsule Take 100 mg by mouth daily.     . propranolol (INDERAL) 20 MG tablet Take 1 tablet (20 mg total) by mouth 3 (three) times daily as needed. (Patient taking differently: Take 20 mg by mouth 3 (three) times daily as needed (rapid heartbeat). ) 60 tablet 3   No current facility-administered medications on file prior to visit.    Past Medical History  Diagnosis Date  . AV block, 2nd degree     a. 05/2005 - s/p MDT Adapta ADDR01 Dual Chamber PPM  . Syncope     a. 04/2011 Echo: EF 55-65%, No RWMA, Gr 1 DD.  . AA (aortic aneurysm) ([place])     a. 04/2011 Echo: Ao Root: 4.1cm, Asc Ao 4.7cm.  . Depression   . Expressive aphasia     a. ongoing since 04/2011 - seen by neurology - ? TIA vs. Migraine  . Facial numbness     a. ongoing since 04/2011 - seen by neurology - ? TIA vs. Migraine  . Anxiety   . Chest pain     a. 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  . Bicuspid aortic valve   . Low blood pressure     Past Surgical History  Procedure Laterality Date  . Pacemaker insertion  2006    Medtronic  . Cataract  2004  . Tonsillectomy    . Bladder tack    . Cholecystectomy  2000  . Vaginal hysterectomy    . Abdominal wall mesh  removal      Social History  reports that she has never smoked. She does not have any smokeless tobacco history on file. She reports that she does not drink alcohol or use illicit drugs.  Family History family history includes Breast cancer (age of onset: 63) in her maternal aunt; COPD in her mother; Stroke in her father.   Review of Systems  Constitutional: Positive for fatigue.  Respiratory: Negative.   Cardiovascular: Negative.   Gastrointestinal: Negative.   Musculoskeletal: Negative.   Skin: Negative.   Neurological: Positive for light-headedness.  Hematological: Negative.   Psychiatric/Behavioral: Negative.   All other systems reviewed and are negative.  BP 104/66 mmHg  Pulse 72  Ht 5\' 3"  (1.6 m)  Wt 173 lb 8 oz (78.699 kg)  BMI 30.74  kg/m2  Physical Exam  Constitutional: She is oriented to person, place, and time. She appears well-developed and well-nourished.  HENT:  Head: Normocephalic.  Nose: Nose normal.  Mouth/Throat: Oropharynx is clear and moist.  Eyes: Conjunctivae are normal. Pupils are equal, round, and reactive to light.  Neck: Normal range of motion. Neck supple. No JVD present.  Cardiovascular: Normal rate, regular rhythm, S1 normal, S2 normal, normal heart sounds and intact distal pulses.  Exam reveals no gallop and no friction rub.   No murmur heard. Pulmonary/Chest: Effort normal and breath sounds normal. No respiratory distress. She has  no wheezes. She has no rales. She exhibits no tenderness.  Abdominal: Soft. Bowel sounds are normal. She exhibits no distension. There is no tenderness.  Musculoskeletal: Normal range of motion. She exhibits no edema or tenderness.  Lymphadenopathy:    She has no cervical adenopathy.  Neurological: She is alert and oriented to person, place, and time. Coordination normal.  Skin: Skin is warm and dry. No rash noted. No erythema.  Psychiatric: She has a normal mood and affect. Her behavior is normal. Judgment and thought content normal.    Assessment and Plan  Nursing note and vitals reviewed.

## 2015-06-04 ENCOUNTER — Ambulatory Visit (HOSPITAL_COMMUNITY)
Admission: RE | Admit: 2015-06-04 | Discharge: 2015-06-04 | Disposition: A | Discharge status: Home / Self Care | Payer: BLUE CROSS/BLUE SHIELD | Source: Ambulatory Visit | Attending: Internal Medicine | Admitting: Internal Medicine

## 2015-06-04 ENCOUNTER — Encounter (HOSPITAL_COMMUNITY): Payer: Self-pay | Admitting: Internal Medicine

## 2015-06-04 ENCOUNTER — Encounter (HOSPITAL_COMMUNITY)
Admission: RE | Disposition: A | Discharge status: Home / Self Care | Payer: Self-pay | Source: Ambulatory Visit | Attending: Internal Medicine

## 2015-06-04 DIAGNOSIS — I495 Sick sinus syndrome: Secondary | ICD-10-CM | POA: Diagnosis not present

## 2015-06-04 DIAGNOSIS — Z950 Presence of cardiac pacemaker: Secondary | ICD-10-CM

## 2015-06-04 DIAGNOSIS — I441 Atrioventricular block, second degree: Secondary | ICD-10-CM

## 2015-06-04 DIAGNOSIS — Z01812 Encounter for preprocedural laboratory examination: Secondary | ICD-10-CM

## 2015-06-04 DIAGNOSIS — Z45010 Encounter for checking and testing of cardiac pacemaker pulse generator [battery]: Secondary | ICD-10-CM | POA: Insufficient documentation

## 2015-06-04 DIAGNOSIS — R002 Palpitations: Secondary | ICD-10-CM

## 2015-06-04 DIAGNOSIS — R55 Syncope and collapse: Secondary | ICD-10-CM | POA: Insufficient documentation

## 2015-06-04 HISTORY — PX: EP IMPLANTABLE DEVICE: SHX172B

## 2015-06-04 LAB — SURGICAL PCR SCREEN
MRSA, PCR: NEGATIVE
Staphylococcus aureus: NEGATIVE

## 2015-06-04 SURGERY — PPM/BIV PPM GENERATOR CHANGEOUT
Anesthesia: LOCAL

## 2015-06-04 MED ORDER — FENTANYL CITRATE (PF) 100 MCG/2ML IJ SOLN
INTRAMUSCULAR | Status: DC | PRN
  Administered 2015-06-04 (×3): 25 ug via INTRAVENOUS

## 2015-06-04 MED ORDER — MIDAZOLAM HCL 5 MG/5ML IJ SOLN
INTRAMUSCULAR | Status: AC
  Filled 2015-06-04: qty 5

## 2015-06-04 MED ORDER — MUPIROCIN 2 % EX OINT
TOPICAL_OINTMENT | CUTANEOUS | Status: AC
  Administered 2015-06-04: 07:00:00
  Filled 2015-06-04: qty 22

## 2015-06-04 MED ORDER — MUPIROCIN 2 % EX OINT
1.0000 "application " | TOPICAL_OINTMENT | Freq: Once | CUTANEOUS | Status: AC
  Administered 2015-06-04: 1 via TOPICAL
  Filled 2015-06-04: qty 22

## 2015-06-04 MED ORDER — ACETAMINOPHEN 325 MG PO TABS
325.0000 mg | ORAL_TABLET | ORAL | Status: DC | PRN
  Filled 2015-06-04: qty 2

## 2015-06-04 MED ORDER — FENTANYL CITRATE (PF) 100 MCG/2ML IJ SOLN
INTRAMUSCULAR | Status: AC
  Filled 2015-06-04: qty 2

## 2015-06-04 MED ORDER — LIDOCAINE HCL (PF) 1 % IJ SOLN
INTRAMUSCULAR | Status: DC | PRN
  Administered 2015-06-04: 31 mL

## 2015-06-04 MED ORDER — VANCOMYCIN HCL IN DEXTROSE 1-5 GM/200ML-% IV SOLN
1000.0000 mg | INTRAVENOUS | Status: AC
Start: 2015-06-04 — End: 2015-06-04
  Administered 2015-06-04: 1000 mg via INTRAVENOUS
  Filled 2015-06-04 (×2): qty 200

## 2015-06-04 MED ORDER — SODIUM CHLORIDE 0.9 % IR SOLN
80.0000 mg | Status: AC
  Administered 2015-06-04: 80 mg
  Filled 2015-06-04: qty 2

## 2015-06-04 MED ORDER — ONDANSETRON HCL 4 MG/2ML IJ SOLN: 4.0000 mg | Freq: Four times a day (QID) | INTRAMUSCULAR | Status: DC | PRN

## 2015-06-04 MED ORDER — MIDAZOLAM HCL 5 MG/5ML IJ SOLN
INTRAMUSCULAR | Status: DC | PRN
  Administered 2015-06-04: 2 mg via INTRAVENOUS
  Administered 2015-06-04 (×3): 1 mg via INTRAVENOUS

## 2015-06-04 MED ORDER — SODIUM CHLORIDE 0.9 % IV SOLN
INTRAVENOUS | Status: DC
Start: 2015-06-04 — End: 2015-06-04
  Administered 2015-06-04 (×2): via INTRAVENOUS

## 2015-06-04 MED ORDER — SODIUM CHLORIDE 0.9 % IV SOLN: INTRAVENOUS | Status: DC

## 2015-06-04 MED ORDER — CHLORHEXIDINE GLUCONATE 4 % EX LIQD
60.0000 mL | Freq: Once | CUTANEOUS | Status: DC
  Filled 2015-06-04: qty 60

## 2015-06-04 MED ORDER — SODIUM CHLORIDE 0.9 % IR SOLN
Status: AC
  Filled 2015-06-04: qty 2

## 2015-06-04 MED ORDER — LIDOCAINE HCL (PF) 1 % IJ SOLN
INTRAMUSCULAR | Status: AC
  Filled 2015-06-04: qty 60

## 2015-06-04 SURGICAL SUPPLY — 6 items
CABLE SURGICAL S-101-97-12 (CABLE) ×2 IMPLANT
HEMOSTAT SURGICEL 2X4 FIBR (HEMOSTASIS) ×1 IMPLANT
PACEMAKER ADAPTA DR ADDRL1 (Pacemaker) ×1 IMPLANT
PAD DEFIB LIFELINK (PAD) ×1 IMPLANT
PPM ADAPTA DR ADDRL1 (Pacemaker) ×2 IMPLANT
TRAY PACEMAKER INSERTION (PACKS) ×1 IMPLANT

## 2015-06-04 NOTE — H&P (Signed)
Pt seen and examined and without intervel cahnge

## 2015-06-04 NOTE — Discharge Instructions (Signed)

## 2015-06-05 ENCOUNTER — Other Ambulatory Visit: Payer: Self-pay | Admitting: Physician Assistant

## 2015-06-05 ENCOUNTER — Telehealth: Payer: Self-pay | Admitting: Physician Assistant

## 2015-06-05 NOTE — Telephone Encounter (Signed)
Patient contacted the after hour answering service as she had PPM device change out yesterday to replace battery. Her wound was covered with dressing and sealed with tape. However she is allergic to tape, and notice blisters popping out this morning.   Unfortunately she is allergic to sulfa and penicillin as well. Therefore could not use silver sulfadiazine. I called pharmacy who recommended some neosporine. She says the tape is attached to the dressing, I told her in that case, cover the blister wound with neosporin and put on new dressing. I told her to delay shower until blister heal. She asked if she can use warm water to wash the area, I recommended against it and nonsterile water can introduce infection in the pacemaker site. I told her to just use neosporin and dressing. I have also instructed her to call us if redness spread out of PPM or if she has fever or chill to suggest infection of PPM site.  A total of 25 min spent coordinating wound care.  Hilbert Corrigan PA Pager: [phone]

## 2015-06-12 ENCOUNTER — Encounter: Payer: Self-pay | Admitting: Internal Medicine

## 2015-06-12 ENCOUNTER — Ambulatory Visit (INDEPENDENT_AMBULATORY_CARE_PROVIDER_SITE_OTHER): Payer: BLUE CROSS/BLUE SHIELD | Admitting: *Deleted

## 2015-06-12 DIAGNOSIS — I441 Atrioventricular block, second degree: Secondary | ICD-10-CM

## 2015-06-13 LAB — CUP PACEART INCLINIC DEVICE CHECK
Battery Impedance: 100 Ohm
Brady Statistic AP VS Percent: 10 %
Brady Statistic AS VP Percent: 0 %
Date Time Interrogation Session: 20161201140239
Implantable Lead Implant Date: 20061128
Implantable Lead Location: 753860
Lead Channel Impedance Value: 524 Ohm
Lead Channel Pacing Threshold Amplitude: 1 V
Lead Channel Pacing Threshold Amplitude: 1 V
Lead Channel Pacing Threshold Pulse Width: 0.4 ms
Lead Channel Sensing Intrinsic Amplitude: 4 mV
Lead Channel Setting Pacing Pulse Width: 0.4 ms
MDC IDC LEAD IMPLANT DT: 20061128
MDC IDC LEAD LOCATION: 753859
MDC IDC LEAD MODEL: 4592
MDC IDC MSMT BATTERY REMAINING LONGEVITY: 177 mo
MDC IDC MSMT BATTERY VOLTAGE: 2.79 V
MDC IDC MSMT LEADCHNL RA PACING THRESHOLD AMPLITUDE: 0.375 V
MDC IDC MSMT LEADCHNL RA PACING THRESHOLD AMPLITUDE: 0.5 V
MDC IDC MSMT LEADCHNL RA PACING THRESHOLD PULSEWIDTH: 0.4 ms
MDC IDC MSMT LEADCHNL RA PACING THRESHOLD PULSEWIDTH: 0.4 ms
MDC IDC MSMT LEADCHNL RV IMPEDANCE VALUE: 704 Ohm
MDC IDC MSMT LEADCHNL RV PACING THRESHOLD PULSEWIDTH: 0.4 ms
MDC IDC MSMT LEADCHNL RV SENSING INTR AMPL: 8 mV
MDC IDC SET LEADCHNL RA PACING AMPLITUDE: 2 V
MDC IDC SET LEADCHNL RV PACING AMPLITUDE: 2.5 V
MDC IDC SET LEADCHNL RV SENSING SENSITIVITY: 4 mV
MDC IDC STAT BRADY AP VP PERCENT: 0 %
MDC IDC STAT BRADY AS VS PERCENT: 90 %

## 2015-06-13 NOTE — Progress Notes (Signed)
Wound check appointment. Dermabond removed. Wound without redness or edema. Small opening noted on R lateral side of incision---steri strips applied. Rash near collarbone and ~3-4 inches below incision, caused by tegaderm, is healing well---patient has been applying OTC ointments. Normal device function. Thresholds, sensing, and impedances consistent with implant measurements. Device programmed at appropriate safety margins. Histogram distribution appropriate for patient and level of activity. No mode switches or high ventricular rates noted. Patient educated about wound care and arm mobility. ROV on 12/5 @0830  for wound recheck and in 3 months with SK/B. Carelink Smart was also paired with cell phone this ov.

## 2015-06-16 ENCOUNTER — Ambulatory Visit (INDEPENDENT_AMBULATORY_CARE_PROVIDER_SITE_OTHER): Payer: BLUE CROSS/BLUE SHIELD | Admitting: *Deleted

## 2015-06-16 DIAGNOSIS — I441 Atrioventricular block, second degree: Secondary | ICD-10-CM

## 2015-06-16 NOTE — Progress Notes (Signed)
Patient presents to the office for a wound recheck. Wound is healing well without redness or edema. Incision edges are mostly approximated-small superficial opening at R corner of incision is healing well. Three additional steri strips applied. Patient will follow up with the Device Clinic/[facility] on 12/13 for wound recheck.

## 2015-06-24 ENCOUNTER — Ambulatory Visit (INDEPENDENT_AMBULATORY_CARE_PROVIDER_SITE_OTHER): Payer: BLUE CROSS/BLUE SHIELD | Admitting: *Deleted

## 2015-06-24 DIAGNOSIS — Z950 Presence of cardiac pacemaker: Secondary | ICD-10-CM

## 2015-06-24 LAB — CUP PACEART INCLINIC DEVICE CHECK
Implantable Lead Implant Date: 20061128
Implantable Lead Implant Date: 20061128
Implantable Lead Location: 753860
Implantable Lead Model: 4092
MDC IDC LEAD LOCATION: 753859
MDC IDC LEAD MODEL: 4592
MDC IDC SESS DTM: 20161213171750

## 2015-06-24 NOTE — Progress Notes (Signed)
Wound recheck in clinic. Patient removed Steri-Strips prior to appointment. Incision fully healed, free of redness, tenderness, or swelling. No rash noted. Pinpoint scab removed from R lateral incision as patient was concerned that it was a stitch (it was just a scab). Patient educated about wound care and arm mobility. ROV with SK on 09/16/14 at 10:00am.

## 2015-06-27 ENCOUNTER — Ambulatory Visit: Payer: Self-pay | Admitting: Physician Assistant

## 2015-06-27 ENCOUNTER — Encounter: Payer: Self-pay | Admitting: Physician Assistant

## 2015-06-27 VITALS — BP 136/80

## 2015-06-27 DIAGNOSIS — F4322 Adjustment disorder with anxiety: Secondary | ICD-10-CM

## 2015-06-27 NOTE — Progress Notes (Signed)
S: pt c/o being stressed and worried her bp is up, has pacemaker, bp usually runs low, had similar problems when her mother had ministrokes 3 years ago, her mother is staying with her, has copd, a-fib, getting up in the middle of night to help her, just doesn't want to get as stressed out as she did 3 years ago  O: vitals wnl, nad, lungs c t a, cv rrr  A: anxiety  P: f/u with cardiologist about bp due to pacemaker I am not comfortable changing the patients medications, told patient a sleep med won't help decrease her stress, she needs to ask people for help with her mother, recommend she see EAP

## 2015-07-01 ENCOUNTER — Encounter: Payer: BLUE CROSS/BLUE SHIELD | Admitting: *Deleted

## 2015-07-10 ENCOUNTER — Ambulatory Visit (INDEPENDENT_AMBULATORY_CARE_PROVIDER_SITE_OTHER): Payer: BLUE CROSS/BLUE SHIELD

## 2015-07-10 ENCOUNTER — Other Ambulatory Visit: Payer: Self-pay

## 2015-07-10 DIAGNOSIS — I712 Thoracic aortic aneurysm, without rupture: Secondary | ICD-10-CM | POA: Diagnosis not present

## 2015-07-10 DIAGNOSIS — I7121 Aneurysm of the ascending aorta, without rupture: Secondary | ICD-10-CM

## 2015-07-17 ENCOUNTER — Telehealth: Payer: Self-pay

## 2015-07-17 NOTE — Telephone Encounter (Signed)
Received fax for a refill on the following: E31mg  + Testo?  Dr. Wynetta Emery I do not see this in her current medication list, unsure if this is something that you prescribe [place]

## 2015-07-17 NOTE — Telephone Encounter (Signed)
I don't write that. 

## 2015-07-18 ENCOUNTER — Telehealth: Payer: Self-pay | Admitting: Family Medicine

## 2015-07-18 NOTE — Telephone Encounter (Signed)
Rx written and called into pharmacy

## 2015-07-18 NOTE — Telephone Encounter (Signed)
Pt called stating Dr Jeananne Rama used to write for her E3 (something).  Would like to speak with Dr Wynetta Emery or her CMA.  She is about to run out and would like to speak to someone today.

## 2015-07-18 NOTE — Telephone Encounter (Signed)
Forward to provider

## 2015-08-07 ENCOUNTER — Telehealth: Payer: Self-pay | Admitting: Family Medicine

## 2015-08-07 MED ORDER — FLUCONAZOLE 150 MG PO TABS: 150.0000 mg | ORAL_TABLET | Freq: Once | ORAL | Status: DC

## 2015-08-07 NOTE — Telephone Encounter (Signed)
Rx sent to her pharmacy 

## 2015-08-07 NOTE — Telephone Encounter (Signed)
Forward to provider

## 2015-08-07 NOTE — Telephone Encounter (Signed)
Pt would like to have something called in to [facility] [address] for a yeast infection. (pt says she's sure that's what it is because she's itching and burning. Pt says she tried monistat last night but was afraid of a reaction so she only applied the cream to the outside.

## 2015-08-18 ENCOUNTER — Ambulatory Visit (INDEPENDENT_AMBULATORY_CARE_PROVIDER_SITE_OTHER): Payer: BLUE CROSS/BLUE SHIELD | Admitting: Family Medicine

## 2015-08-18 ENCOUNTER — Encounter: Payer: Self-pay | Admitting: Family Medicine

## 2015-08-18 VITALS — BP 116/80 | HR 80 | Temp 98.4°F | Ht 63.0 in | Wt 171.0 lb

## 2015-08-18 DIAGNOSIS — R300 Dysuria: Secondary | ICD-10-CM

## 2015-08-18 DIAGNOSIS — N3000 Acute cystitis without hematuria: Secondary | ICD-10-CM

## 2015-08-18 MED ORDER — NITROFURANTOIN MONOHYD MACRO 100 MG PO CAPS: 100.0000 mg | ORAL_CAPSULE | Freq: Two times a day (BID) | ORAL | Status: DC

## 2015-08-18 NOTE — Progress Notes (Signed)
BP 116/80 mmHg  Pulse 80  Temp(Src) 98.4 F (36.9 C)  Ht 5\' 3"  (1.6 m)  Wt 171 lb (77.565 kg)  BMI 30.30 kg/m2  SpO2 97%   Subjective:    Patient ID: Wendy Kelly, female    DOB: [birth year], 61 y.o.   MRN: [id number]  HPI: Wendy Kelly is a 60 y.o. female  Chief Complaint  Patient presents with  . Follow-up    Patient went to UC a week ago yesterday, she thought that she had a UTI, her urine was fine, the culture showed    URINARY SYMPTOMS Duration: 2 weeks Dysuria: no Urinary frequency: yes Urgency: yes Small volume voids: yes Symptom severity: moderate Urinary incontinence: no Foul odor: no Hematuria: no Abdominal pain: no Back pain: yes Suprapubic pain/pressure: yes Flank pain: no Fever:  no Vomiting: no Relief with cranberry juice: no Relief with pyridium: no Status: stable Previous urinary tract infection: yes Recurrent urinary tract infection: no Vaginal discharge: no Treatments attempted: diflucan and increasing fluids   Relevant past medical, surgical, family and social history reviewed and updated as indicated. Interim medical history since our last visit reviewed. Allergies and medications reviewed and updated.  Review of Systems  Constitutional: Negative.   Respiratory: Negative.   Cardiovascular: Negative.   Gastrointestinal: Negative.   Genitourinary: Negative.   Psychiatric/Behavioral: Negative.     Per HPI unless specifically indicated above     Objective:    BP 116/80 mmHg  Pulse 80  Temp(Src) 98.4 F (36.9 C)  Ht 5\' 3"  (1.6 m)  Wt 171 lb (77.565 kg)  BMI 30.30 kg/m2  SpO2 97%  Wt Readings from Last 3 Encounters:  08/18/15 171 lb (77.565 kg)  06/04/15 170 lb (77.111 kg)  05/29/15 173 lb 8 oz (78.699 kg)    Physical Exam  Constitutional: She is oriented to person, place, and time. She appears well-developed and well-nourished. No distress.  HENT:  Head: Normocephalic and atraumatic.  Right Ear: Hearing normal.  Left Ear:  Hearing normal.  Nose: Nose normal.  Eyes: Conjunctivae and lids are normal. Right eye exhibits no discharge. Left eye exhibits no discharge. No scleral icterus.  Cardiovascular: Normal rate, regular rhythm, normal heart sounds and intact distal pulses.  Exam reveals no gallop and no friction rub.   No murmur heard. Pulmonary/Chest: Effort normal and breath sounds normal. No respiratory distress. She has no wheezes. She has no rales. She exhibits no tenderness.  Abdominal: Soft. Bowel sounds are normal. She exhibits no distension and no mass. There is no tenderness. There is no rebound and no guarding.  Musculoskeletal: Normal range of motion.  Neurological: She is alert and oriented to person, place, and time.  Skin: Skin is warm, dry and intact. No rash noted. No erythema. No pallor.  Psychiatric: She has a normal mood and affect. Her speech is normal and behavior is normal. Judgment and thought content normal. Cognition and memory are normal.  Nursing note and vitals reviewed.   Results for orders placed or performed in visit on 06/24/15  Implantable device check  Result Value Ref Range   Pulse Generator Manufacturer MERM    Date Time Interrogation Session [phone]    Pulse Gen Model ADDRL1 Adapta    Pulse Gen Serial Number [id number]    Implantable Pulse Generator Type Implantable Pulse Generator    Implantable Pulse Generator Implant Date [id number]    Implantable Lead Manufacturer MERM    Implantable Lead Model 4092 CapSure SP Novus  Implantable Lead Serial Number [id number]    Implantable Lead Implant Date JV:1138310    Implantable Lead Location [phone]    Implantable Lead Manufacturer [employer]    Implantable Lead Model 4592    Implantable Lead Serial Number [id number]    Implantable Lead Implant Date JV:1138310    Implantable Lead Location [id number]       Assessment & Plan:   Problem List Items Addressed This Visit    None    Visit Diagnoses    Acute cystitis  without hematuria    -  Primary    Will treat with nitrofurantoin. Long discussion about what to do with an allergic reaction. Continue to push fluids. Continue to monitor closely.    Dysuria        UA checked today with + Leuks    Relevant Orders    UA/M w/rflx Culture, Routine        Follow up plan: Return if symptoms worsen or fail to improve.

## 2015-08-20 LAB — MICROSCOPIC EXAMINATION: RBC, UA: NONE SEEN /hpf (ref 0–?)

## 2015-08-20 LAB — UA/M W/RFLX CULTURE, ROUTINE
BILIRUBIN UA: NEGATIVE
GLUCOSE, UA: NEGATIVE
KETONES UA: NEGATIVE
Nitrite, UA: NEGATIVE
PROTEIN UA: NEGATIVE
RBC UA: NEGATIVE
Specific Gravity, UA: 1.02 (ref 1.005–1.030)
UUROB: 0.2 mg/dL (ref 0.2–1.0)
pH, UA: 6 (ref 5.0–7.5)

## 2015-08-20 LAB — URINE CULTURE, REFLEX

## 2015-08-22 ENCOUNTER — Other Ambulatory Visit: Payer: BLUE CROSS/BLUE SHIELD

## 2015-08-22 ENCOUNTER — Telehealth: Payer: Self-pay

## 2015-08-22 DIAGNOSIS — N390 Urinary tract infection, site not specified: Secondary | ICD-10-CM

## 2015-08-22 LAB — UA/M W/RFLX CULTURE, ROUTINE
Bilirubin, UA: NEGATIVE
GLUCOSE, UA: NEGATIVE
KETONES UA: NEGATIVE
Leukocytes, UA: NEGATIVE
NITRITE UA: NEGATIVE
PH UA: 7 (ref 5.0–7.5)
PROTEIN UA: NEGATIVE
RBC UA: NEGATIVE
Specific Gravity, UA: 1.01 (ref 1.005–1.030)
Urobilinogen, Ur: 0.2 mg/dL (ref 0.2–1.0)

## 2015-08-22 NOTE — Telephone Encounter (Signed)
Patient states that last night she had a bad headache over her temple. She is unsure if it is the antibiotic? The headache has not went away, she has to constantly take medication.

## 2015-08-22 NOTE — Telephone Encounter (Signed)
Patient notified. She will come and do a UA

## 2015-08-22 NOTE — Telephone Encounter (Signed)
Unclear if that is from the antibiotic. If she is able to why doesn't she come in just for a repeat UA to see if it's cleared up already

## 2015-09-16 ENCOUNTER — Ambulatory Visit (INDEPENDENT_AMBULATORY_CARE_PROVIDER_SITE_OTHER): Payer: BLUE CROSS/BLUE SHIELD | Admitting: Internal Medicine

## 2015-09-16 ENCOUNTER — Encounter: Payer: Self-pay | Admitting: Internal Medicine

## 2015-09-16 VITALS — BP 106/71 | HR 71 | Ht 63.0 in | Wt 174.8 lb

## 2015-09-16 DIAGNOSIS — R002 Palpitations: Secondary | ICD-10-CM | POA: Diagnosis not present

## 2015-09-16 DIAGNOSIS — I951 Orthostatic hypotension: Secondary | ICD-10-CM

## 2015-09-16 DIAGNOSIS — I441 Atrioventricular block, second degree: Secondary | ICD-10-CM

## 2015-09-16 DIAGNOSIS — Z950 Presence of cardiac pacemaker: Secondary | ICD-10-CM | POA: Diagnosis not present

## 2015-09-16 NOTE — Progress Notes (Signed)
skf      Patient Care Team: Valerie Roys, DO as PCP - General (Family Medicine)   HPI  Wendy Kelly is a 60 y.o. female Seen in followup for a pacemaker implanted for syncope with intermittent second degree heart block. She received a Medtronic adapa device in 2006   Her device reverted to ERI 10/16 and she underwent generator replacement 11/16.   Blood pressures have been better. She notes that low blood pressure is the strongest correlate with her dizziness. We reviewed her daytime activities. She showers in the mornings. Washes her hair at the same time. She does not note other activity correlations i.e. Exposure to heat or alcohol     She was admitted 12/15 for atypical chest pain at which time stable ascending aortic aneurysm was noted      DATE TEST    12/15 echo   EF50-55  mod ascending aortic dilitation 36 mm  1/16 myoview   EF 40-"normal"  no ischemia  12/15 CTA 4.6 cm  rec semi annual evaluation    Aortic root 4.7-4.8  Dr. 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 AV block, 2nd degree     a. 05/2005 - s/p MDT Adapta ADDR01 Dual Chamber PPM  . Syncope     a. 04/2011 Echo: EF 55-65%, No RWMA, Gr 1 DD.  . AA (aortic aneurysm) ([place])     a. 04/2011 Echo: Ao Root: 4.1cm, Asc Ao 4.7cm.  . Depression   . Expressive aphasia     a. ongoing since 04/2011 - seen by neurology - ? TIA vs. Migraine  . Facial numbness     a. ongoing since 04/2011 - seen by neurology - ? TIA vs. Migraine  . Anxiety   . Chest pain     a. 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  . Bicuspid aortic valve   . Low blood pressure     Past Surgical History  Procedure Laterality Date  . Pacemaker insertion  2006    Medtronic  . Cataract  2004  . Tonsillectomy    .  Bladder tack    . Cholecystectomy  2000  . Vaginal hysterectomy    . Abdominal wall mesh  removal    . Ep implantable device N/A 06/04/2015    Procedure: PPM Generator Changeout;  Surgeon: Deboraha Sprang, MD;  Location: [facility];  Service: Cardiovascular;  Laterality: N/A;    Current Outpatient Prescriptions  Medication Sig Dispense Refill  . aspirin 81 MG tablet Take 81 mg by mouth daily.    . B Complex Vitamins (VITAMIN B COMPLEX PO) Take 1 tablet by mouth daily.     . Calcium 75 MG TABS Take 1 tablet by mouth daily.     . Cyanocobalamin (VITAMIN B 12 PO) Take by mouth daily.    . fexofenadine (ALLEGRA) 180 MG tablet Take 90 mg by mouth daily as needed.     . fluticasone (FLONASE) 50 MCG/ACT nasal spray Place 1 spray into the nose daily as needed.     Marland Kitchen ibuprofen (ADVIL,MOTRIN) 200 MG tablet Takes 2-3 tablets as needed.    . Loratadine (CLARITIN PO) Take 1 tablet by mouth daily as needed.     . nitrofurantoin, macrocrystal-monohydrate, (MACROBID) 100 MG capsule  Take 1 capsule (100 mg total) by mouth 2 (two) times daily. 14 capsule 0  . nitroGLYCERIN (NITROSTAT) 0.4 MG SL tablet Place 1 tablet (0.4 mg total) under the tongue every 5 (five) minutes as needed for chest pain. 25 tablet 6  . Nutritional Supplements (DHEA PO) Take 5 mg by mouth. Take two tablets in the am    . PRESCRIPTION MEDICATION Place 1 tablet vaginally 2 (two) times a week. Tuesday and Saturdays E3 1mg  and Testosterone 1mg  compound    . Probiotic Product (PROBIOTIC PO) Take 1 capsule by mouth daily.     . progesterone (PROMETRIUM) 100 MG capsule Take 100 mg by mouth daily.     . propranolol (INDERAL) 20 MG tablet Take 1 tablet (20 mg total) by mouth 3 (three) times daily as needed. (Patient taking differently: Take 20 mg by mouth 3 (three) times daily as needed (rapid heartbeat). ) 60 tablet 3  . sodium chloride (MURO 128) 5 % ophthalmic solution Place 1 drop into both eyes daily.     No current  facility-administered medications for this visit.    Allergies  Allergen Reactions  . Cephalosporins Swelling  . Tape Rash  . Cefdinir     unknown   . Codeine Nausea And Vomiting  . Entex Lq [Phenylephrine-Guaifenesin]   . Flagyl [Metronidazole] Swelling  . Guaifenesin & Derivatives Other (See Comments)    Night terrors  . Lactose Intolerance (Gi) Other (See Comments)    GI upset  . Naproxen Sodium     Itching/rash  . Other Other (See Comments)    Flu vaccine:  Weakness, dizziness, tia type symptoms.    . Oxycodone Nausea And Vomiting  . Penicillins     Rash/hives  . Succinylcholine Other (See Comments)    Trouble waking up  . Sulfonamide Derivatives     Rash/itching  . Tetracyclines & Related Other (See Comments)    Unkown  . Pseudoephedrine Rash    Review of Systems negative except from HPI and PMH  Physical Exam BP 106/71 mmHg  Pulse 71  Ht 5\' 3"  (1.6 m)  Wt 174 lb 12 oz (79.266 kg)  BMI 30.96 kg/m2 Well developed and well nourished in no acute distress HENT normal E scleral and icterus clear Neck Supple JVP flat; carotids brisk and full Clear to ausculation Device pocket well healed; without hematoma or erythema.  There is no tethering Regular rate and rhythm, no murmurs gallops or rub Soft with active bowel sounds No clubbing cyanosis no Edema Alert and oriented, grossly normal motor and sensory function Skin Warm and Dry  ECG demonstrates sinus rhythm at  71 Intervals 16/09/39  Otherwise normal  Assessment and  Plan  Syncope  Intermittent second degree heart block-   Pacemaker-Medtronic  The patient's device was interrogated.  The information was reviewed. No changes were made in the programming.    Hypotension  ? TIA  Ascending aortic aneurysm  Pain at site    No atrial fibrillation is noted on her device  She continues with problems with episodic hypotension.    We discussed altrenative nonpharmacologic strategies incl support  wear and showering in the AM.   always concerned when pain worsens over time following generator replacement. I have reviewed with the patient the concern regarding infection. Inspection is not particularly illuminating today. There is some retraction of the incision in the longitudinal axis suggesting that some of the pain is related to tethering

## 2015-09-16 NOTE — Patient Instructions (Addendum)
Medication Instructions: - Your physician recommends that you continue on your current medications as directed. Please refer to the Current Medication list given to you today.  Labwork: - none  Procedures/Testing: -  none  Follow-Up: - Your physician recommends that you schedule a follow-up appointment in: June with Dr. Rockey Situ.  - Remote monitoring is used to monitor your Pacemaker of ICD from home. This monitoring reduces the number of office visits required to check your device to one time per year. It allows [place] to keep an eye on the functioning of your device to ensure it is working properly. You are scheduled for a device check from home on 12/16/15. You may send your transmission at any time that day. If you have a wireless device, the transmission will be sent automatically. After your physician reviews your transmission, you will receive a postcard with your next transmission date.  - Your physician wants you to follow-up in: 1 year with Dr. Caryl Comes. You will receive a reminder letter in the mail two months in advance. If you don't receive a letter, please call our office to schedule the follow-up appointment.  Any Additional Special Instructions Will Be Listed Below (If Applicable).     If you need a refill on your cardiac medications before your next appointment, please call your pharmacy.

## 2015-09-24 LAB — CUP PACEART INCLINIC DEVICE CHECK
Date Time Interrogation Session: 20170315125525
Implantable Lead Implant Date: 20061128
Implantable Lead Location: 753859
Implantable Lead Model: 4092
Implantable Lead Model: 4592
MDC IDC LEAD IMPLANT DT: 20061128
MDC IDC LEAD LOCATION: 753860

## 2015-10-31 DIAGNOSIS — J301 Allergic rhinitis due to pollen: Secondary | ICD-10-CM | POA: Diagnosis not present

## 2015-10-31 DIAGNOSIS — K219 Gastro-esophageal reflux disease without esophagitis: Secondary | ICD-10-CM | POA: Diagnosis not present

## 2015-11-03 DIAGNOSIS — R51 Headache: Secondary | ICD-10-CM | POA: Diagnosis not present

## 2015-11-03 DIAGNOSIS — Z8673 Personal history of transient ischemic attack (TIA), and cerebral infarction without residual deficits: Secondary | ICD-10-CM | POA: Diagnosis not present

## 2015-11-03 DIAGNOSIS — R202 Paresthesia of skin: Secondary | ICD-10-CM | POA: Diagnosis not present

## 2015-11-03 DIAGNOSIS — R42 Dizziness and giddiness: Secondary | ICD-10-CM | POA: Diagnosis not present

## 2015-11-27 ENCOUNTER — Other Ambulatory Visit: Payer: Self-pay

## 2015-11-27 MED ORDER — PROGESTERONE MICRONIZED 100 MG PO CAPS
100.0000 mg | ORAL_CAPSULE | Freq: Every day | ORAL | Status: DC
Start: 2015-11-27 — End: 2016-11-15

## 2015-11-27 NOTE — Telephone Encounter (Signed)
Patient had physical is 03/2015 and has another scheduled 04/2016. Pharmacy is [facility].

## 2015-11-28 ENCOUNTER — Other Ambulatory Visit: Payer: Self-pay | Admitting: Otolaryngology

## 2015-11-28 DIAGNOSIS — R1310 Dysphagia, unspecified: Secondary | ICD-10-CM | POA: Diagnosis not present

## 2015-11-28 DIAGNOSIS — IMO0001 Reserved for inherently not codable concepts without codable children: Secondary | ICD-10-CM

## 2015-11-28 DIAGNOSIS — K219 Gastro-esophageal reflux disease without esophagitis: Secondary | ICD-10-CM

## 2015-11-28 DIAGNOSIS — R221 Localized swelling, mass and lump, neck: Secondary | ICD-10-CM

## 2015-12-03 ENCOUNTER — Ambulatory Visit
Admission: RE | Admit: 2015-12-03 | Discharge: 2015-12-03 | Disposition: A | Discharge status: Home / Self Care | Payer: BLUE CROSS/BLUE SHIELD | Source: Ambulatory Visit | Attending: Otolaryngology | Admitting: Otolaryngology

## 2015-12-03 DIAGNOSIS — R221 Localized swelling, mass and lump, neck: Secondary | ICD-10-CM | POA: Diagnosis not present

## 2015-12-03 DIAGNOSIS — R1310 Dysphagia, unspecified: Secondary | ICD-10-CM | POA: Insufficient documentation

## 2015-12-03 DIAGNOSIS — K219 Gastro-esophageal reflux disease without esophagitis: Secondary | ICD-10-CM | POA: Insufficient documentation

## 2015-12-03 DIAGNOSIS — IMO0001 Reserved for inherently not codable concepts without codable children: Secondary | ICD-10-CM

## 2015-12-03 LAB — POCT I-STAT CREATININE: Creatinine, Ser: 0.7 mg/dL (ref 0.44–1.00)

## 2015-12-03 MED ORDER — IOPAMIDOL (ISOVUE-300) INJECTION 61%
75.0000 mL | Freq: Once | INTRAVENOUS | Status: AC | PRN
  Administered 2015-12-03: 75 mL via INTRAVENOUS

## 2015-12-03 NOTE — Telephone Encounter (Signed)
This encounter was created in error - please disregard.

## 2015-12-04 DIAGNOSIS — K219 Gastro-esophageal reflux disease without esophagitis: Secondary | ICD-10-CM | POA: Diagnosis not present

## 2015-12-04 DIAGNOSIS — F458 Other somatoform disorders: Secondary | ICD-10-CM | POA: Diagnosis not present

## 2015-12-12 ENCOUNTER — Encounter: Payer: Self-pay | Admitting: *Deleted

## 2015-12-12 ENCOUNTER — Other Ambulatory Visit: Payer: Self-pay

## 2015-12-16 ENCOUNTER — Ambulatory Visit (INDEPENDENT_AMBULATORY_CARE_PROVIDER_SITE_OTHER): Payer: BLUE CROSS/BLUE SHIELD | Admitting: *Deleted

## 2015-12-16 DIAGNOSIS — I441 Atrioventricular block, second degree: Secondary | ICD-10-CM | POA: Diagnosis not present

## 2015-12-16 DIAGNOSIS — Z950 Presence of cardiac pacemaker: Secondary | ICD-10-CM | POA: Diagnosis not present

## 2015-12-16 NOTE — Progress Notes (Signed)
Remote pacemaker transmission.   

## 2015-12-16 NOTE — Discharge Instructions (Signed)

## 2015-12-18 ENCOUNTER — Ambulatory Visit: Payer: BLUE CROSS/BLUE SHIELD | Admitting: Anesthesiology

## 2015-12-18 ENCOUNTER — Ambulatory Visit
Admission: RE | Admit: 2015-12-18 | Discharge: 2015-12-18 | Disposition: A | Discharge status: Home / Self Care | Payer: BLUE CROSS/BLUE SHIELD | Source: Ambulatory Visit | Attending: Gastroenterology | Admitting: Gastroenterology

## 2015-12-18 ENCOUNTER — Encounter
Admission: RE | Disposition: A | Discharge status: Home / Self Care | Payer: Self-pay | Source: Ambulatory Visit | Attending: Gastroenterology

## 2015-12-18 DIAGNOSIS — K219 Gastro-esophageal reflux disease without esophagitis: Secondary | ICD-10-CM | POA: Diagnosis not present

## 2015-12-18 DIAGNOSIS — Z7982 Long term (current) use of aspirin: Secondary | ICD-10-CM | POA: Insufficient documentation

## 2015-12-18 DIAGNOSIS — R079 Chest pain, unspecified: Secondary | ICD-10-CM | POA: Insufficient documentation

## 2015-12-18 DIAGNOSIS — I714 Abdominal aortic aneurysm, without rupture: Secondary | ICD-10-CM | POA: Insufficient documentation

## 2015-12-18 DIAGNOSIS — Z880 Allergy status to penicillin: Secondary | ICD-10-CM | POA: Diagnosis not present

## 2015-12-18 DIAGNOSIS — F419 Anxiety disorder, unspecified: Secondary | ICD-10-CM | POA: Insufficient documentation

## 2015-12-18 DIAGNOSIS — F329 Major depressive disorder, single episode, unspecified: Secondary | ICD-10-CM | POA: Diagnosis not present

## 2015-12-18 DIAGNOSIS — E739 Lactose intolerance, unspecified: Secondary | ICD-10-CM | POA: Diagnosis not present

## 2015-12-18 DIAGNOSIS — Z888 Allergy status to other drugs, medicaments and biological substances status: Secondary | ICD-10-CM | POA: Diagnosis not present

## 2015-12-18 DIAGNOSIS — I441 Atrioventricular block, second degree: Secondary | ICD-10-CM | POA: Diagnosis not present

## 2015-12-18 DIAGNOSIS — K449 Diaphragmatic hernia without obstruction or gangrene: Secondary | ICD-10-CM | POA: Insufficient documentation

## 2015-12-18 DIAGNOSIS — Z950 Presence of cardiac pacemaker: Secondary | ICD-10-CM | POA: Insufficient documentation

## 2015-12-18 DIAGNOSIS — Z885 Allergy status to narcotic agent status: Secondary | ICD-10-CM | POA: Diagnosis not present

## 2015-12-18 DIAGNOSIS — K2970 Gastritis, unspecified, without bleeding: Secondary | ICD-10-CM | POA: Insufficient documentation

## 2015-12-18 DIAGNOSIS — Q231 Congenital insufficiency of aortic valve: Secondary | ICD-10-CM | POA: Insufficient documentation

## 2015-12-18 DIAGNOSIS — I739 Peripheral vascular disease, unspecified: Secondary | ICD-10-CM | POA: Diagnosis not present

## 2015-12-18 DIAGNOSIS — Z79899 Other long term (current) drug therapy: Secondary | ICD-10-CM | POA: Insufficient documentation

## 2015-12-18 DIAGNOSIS — Z91048 Other nonmedicinal substance allergy status: Secondary | ICD-10-CM | POA: Insufficient documentation

## 2015-12-18 DIAGNOSIS — R1310 Dysphagia, unspecified: Secondary | ICD-10-CM | POA: Diagnosis not present

## 2015-12-18 DIAGNOSIS — R200 Anesthesia of skin: Secondary | ICD-10-CM | POA: Insufficient documentation

## 2015-12-18 DIAGNOSIS — Z881 Allergy status to other antibiotic agents status: Secondary | ICD-10-CM | POA: Insufficient documentation

## 2015-12-18 DIAGNOSIS — Z91018 Allergy to other foods: Secondary | ICD-10-CM | POA: Diagnosis not present

## 2015-12-18 DIAGNOSIS — R4701 Aphasia: Secondary | ICD-10-CM | POA: Insufficient documentation

## 2015-12-18 DIAGNOSIS — K3189 Other diseases of stomach and duodenum: Secondary | ICD-10-CM | POA: Diagnosis not present

## 2015-12-18 HISTORY — DX: Presence of cardiac pacemaker: Z95.0

## 2015-12-18 HISTORY — PX: ESOPHAGOGASTRODUODENOSCOPY (EGD) WITH PROPOFOL: SHX5813

## 2015-12-18 HISTORY — DX: Gastro-esophageal reflux disease without esophagitis: K21.9

## 2015-12-18 SURGERY — ESOPHAGOGASTRODUODENOSCOPY (EGD) WITH PROPOFOL
Anesthesia: Monitor Anesthesia Care | Wound class: Clean Contaminated

## 2015-12-18 MED ORDER — GLYCOPYRROLATE 0.2 MG/ML IJ SOLN
INTRAMUSCULAR | Status: DC | PRN
  Administered 2015-12-18: 0.1 mg via INTRAVENOUS

## 2015-12-18 MED ORDER — LIDOCAINE HCL (CARDIAC) 20 MG/ML IV SOLN
INTRAVENOUS | Status: DC | PRN
  Administered 2015-12-18: 50 mg via INTRAVENOUS

## 2015-12-18 MED ORDER — LACTATED RINGERS IV SOLN
INTRAVENOUS | Status: DC
  Administered 2015-12-18: 09:00:00 via INTRAVENOUS

## 2015-12-18 MED ORDER — PROPOFOL 10 MG/ML IV BOLUS
INTRAVENOUS | Status: DC | PRN
  Administered 2015-12-18: 50 mg via INTRAVENOUS
  Administered 2015-12-18: 100 mg via INTRAVENOUS
  Administered 2015-12-18: 50 mg via INTRAVENOUS

## 2015-12-18 SURGICAL SUPPLY — 31 items
BALLN DILATOR 10-12 8 (BALLOONS)
BALLN DILATOR 12-15 8 (BALLOONS)
BALLN DILATOR 15-18 8 (BALLOONS)
BALLN DILATOR CRE 0-12 8 (BALLOONS)
BALLN DILATOR ESOPH 8 10 CRE (MISCELLANEOUS) IMPLANT
BALLOON DILATOR 12-15 8 (BALLOONS) IMPLANT
BALLOON DILATOR 15-18 8 (BALLOONS) IMPLANT
BALLOON DILATOR CRE 0-12 8 (BALLOONS) IMPLANT
BLOCK BITE 60FR ADLT L/F GRN (MISCELLANEOUS) ×2 IMPLANT
CANISTER SUCT 1200ML W/VALVE (MISCELLANEOUS) ×2 IMPLANT
CLIP HMST 235XBRD CATH ROT (MISCELLANEOUS) IMPLANT
CLIP RESOLUTION 360 11X235 (MISCELLANEOUS)
FCP ESCP3.2XJMB 240X2.8X (MISCELLANEOUS)
FORCEPS BIOP RAD 4 LRG CAP 4 (CUTTING FORCEPS) ×2 IMPLANT
FORCEPS BIOP RJ4 240 W/NDL (MISCELLANEOUS)
FORCEPS ESCP3.2XJMB 240X2.8X (MISCELLANEOUS) IMPLANT
GOWN CVR UNV OPN BCK APRN NK (MISCELLANEOUS) ×2 IMPLANT
GOWN ISOL THUMB LOOP REG UNIV (MISCELLANEOUS) ×4
INJECTOR VARIJECT VIN23 (MISCELLANEOUS) IMPLANT
KIT DEFENDO VALVE AND CONN (KITS) IMPLANT
KIT ENDO PROCEDURE OLY (KITS) ×2 IMPLANT
MARKER SPOT ENDO TATTOO 5ML (MISCELLANEOUS) IMPLANT
PAD GROUND ADULT SPLIT (MISCELLANEOUS) IMPLANT
SNARE SHORT THROW 13M SML OVAL (MISCELLANEOUS) IMPLANT
SNARE SHORT THROW 30M LRG OVAL (MISCELLANEOUS) IMPLANT
SPOT EX ENDOSCOPIC TATTOO (MISCELLANEOUS)
SYR INFLATION 60ML (SYRINGE) IMPLANT
TRAP ETRAP POLY (MISCELLANEOUS) ×2 IMPLANT
VARIJECT INJECTOR VIN23 (MISCELLANEOUS)
WATER STERILE IRR 250ML POUR (IV SOLUTION) ×2 IMPLANT
WIRE CRE 18-20MM 8CM F G (MISCELLANEOUS) IMPLANT

## 2015-12-18 NOTE — Anesthesia Procedure Notes (Signed)
Procedure Name: MAC Date/Time: 12/18/2015 9:39 AM Performed by: Cameron Ali Pre-anesthesia Checklist: Patient identified, Emergency Drugs available, Suction available, Timeout performed and Patient being monitored Patient Re-evaluated:Patient Re-evaluated prior to inductionOxygen Delivery Method: Nasal cannula Placement Confirmation: positive ETCO2

## 2015-12-18 NOTE — Op Note (Signed)
[facility]astroenter[facility] Patient Name: Wendy Kelly Procedure Date: 12/18/2015 9:39 AM MRN: [id number] Account #: [id number] Date of Birth: 12/15/1955 Admit Type: Outpatient Age: 60 Room: [facility] OR ROOM 01 Gender: Female Note Status: Finalized Procedure:            Upper GI endoscopy Indications:          Dysphagia Providers:            Lucilla Lame, MD Referring MD:         Valerie Roys (Referring MD) Medicines:            Propofol per Anesthesia Complications:        No immediate complications. Procedure:            Pre-Anesthesia Assessment:                       - Prior to the procedure, a History and Physical was                        performed, and patient medications and allergies were                        reviewed. The patient's tolerance of previous                        anesthesia was also reviewed. The risks and benefits of                        the procedure and the sedation options and risks were                        discussed with the patient. All questions were                        answered, and informed consent was obtained. Prior                        Anticoagulants: The patient has taken no previous                        anticoagulant or antiplatelet agents. ASA Grade                        Assessment: II - A patient with mild systemic disease.                        After reviewing the risks and benefits, the patient was                        deemed in satisfactory condition to undergo the                        procedure.                       After obtaining informed consent, the endoscope was                        passed under direct vision. Throughout the procedure,  the patient's blood pressure, pulse, and oxygen                        saturations were monitored continuously. The Olympus                        GIF H180J colonscope [id number]) was introduced                        through the mouth,  and advanced to the second part of                        duodenum. The upper GI endoscopy was accomplished                        without difficulty. The patient tolerated the procedure                        well. Findings:      A small hiatal hernia was present.      Localized mild inflammation characterized by erythema was found in the       gastric antrum. Biopsies were taken with a cold forceps for histology.      The examined duodenum was normal.      The scope was withdrawn. Dilation was attempted, but the lesion was not       amenable to treatment in the entire esophagus with a Maloney dilator       because no resistance at 54 Fr. Impression:           - Small hiatal hernia.                       - Gastritis. Biopsied.                       - Normal examined duodenum.                       - Dilation attempted in the entire esophagus. Recommendation:       - Continue present medications. Procedure Code(s):    --- Professional ---                       [phone], Esophagogastroduodenoscopy, flexible, transoral;                        with biopsy, single or multiple                       43450, Dilation of esophagus, by unguided sound or                        bougie, single or multiple passes Diagnosis Code(s):    --- Professional ---                       R13.10, Dysphagia, unspecified                       K29.70, Gastritis, unspecified, without bleeding CPT copyright 2016 American Medical Association. All rights reserved. The codes documented in this report are preliminary and upon coder review may  be revised to meet current compliance requirements. Leeyah Heather  Allen Norris, MD 12/18/2015 9:54:42 AM This report has been signed electronically. Number of Addenda: 0 Note Initiated On: 12/18/2015 9:39 AM      [facility]

## 2015-12-18 NOTE — Anesthesia Postprocedure Evaluation (Signed)
Anesthesia Post Note  Patient: Wendy Kelly  Procedure(s) Performed: Procedure(s) (LRB): ESOPHAGOGASTRODUODENOSCOPY (EGD) WITH gastric biopsy and dilation (N/A)  Patient location during evaluation: PACU Anesthesia Type: General Level of consciousness: awake and alert Pain management: pain level controlled Vital Signs Assessment: post-procedure vital signs reviewed and stable Respiratory status: spontaneous breathing, nonlabored ventilation, respiratory function stable and patient connected to nasal cannula oxygen Cardiovascular status: blood pressure returned to baseline and stable Postop Assessment: no signs of nausea or vomiting Anesthetic complications: no    Marshell Levan

## 2015-12-18 NOTE — Transfer of Care (Signed)
Immediate Anesthesia Transfer of Care Note  Patient: Wendy Kelly  Procedure(s) Performed: Procedure(s): ESOPHAGOGASTRODUODENOSCOPY (EGD) WITH gastric biopsy and dilation (N/A)  Patient Location: PACU  Anesthesia Type: MAC  Level of Consciousness: awake, alert  and patient cooperative  Airway and Oxygen Therapy: Patient Spontanous Breathing and Patient connected to supplemental oxygen  Post-op Assessment: Post-op Vital signs reviewed, Patient's Cardiovascular Status Stable, Respiratory Function Stable, Patent Airway and No signs of Nausea or vomiting  Post-op Vital Signs: Reviewed and stable  Complications: No apparent anesthesia complications

## 2015-12-18 NOTE — H&P (Signed)
Lucilla Lame, MD [facility] [address] [place], [place] [address] Phone: [phone] Fax : [phone]  Primary Care Physician:  Park Liter, DO Primary Gastroenterologist:  Dr. Allen Norris  Pre-Procedure History & Physical: HPI:  Wendy Kelly is a 60 y.o. female is here for an endoscopy.   Past Medical History  Diagnosis Date  . AV block, 2nd degree     a. 05/2005 - s/p MDT Adapta ADDR01 Dual Chamber PPM  . Syncope     a. 04/2011 Echo: EF 55-65%, No RWMA, Gr 1 DD.  . AA (aortic aneurysm) ([place])     a. 04/2011 Echo: Ao Root: 4.1cm, Asc Ao 4.7cm.  . Depression   . Expressive aphasia     a. ongoing since 04/2011 - seen by neurology - ? TIA vs. Migraine  . Facial numbness     a. ongoing since 04/2011 - seen by neurology - ? TIA vs. Migraine  . Anxiety   . Chest pain     a. 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  . Bicuspid aortic valve   . Low blood pressure   . GERD (gastroesophageal reflux disease)   . Presence of permanent cardiac pacemaker     Past Surgical History  Procedure Laterality Date  . Pacemaker insertion  2006    Medtronic Adapta [id number]  . Cataract  2004  . Tonsillectomy    . Bladder tack    . Cholecystectomy  2000  . Vaginal hysterectomy    . Abdominal wall mesh  removal    . Ep implantable device N/A 06/04/2015    Procedure: PPM Generator Changeout;  Surgeon: Deboraha Sprang, MD;  Location: [place] [facility];  Service: Cardiovascular;  Laterality: N/A;    Prior to Admission medications   Medication Sig Start Date End Date Taking? Authorizing Provider  aspirin 81 MG tablet Take 81 mg by mouth daily.   Yes Historical Provider, MD  Calcium 75 MG TABS Take 1 tablet by mouth daily.    Yes Historical Provider, MD  CRANBERRY PO Take by mouth daily.   Yes Historical Provider, MD  Cyanocobalamin (VITAMIN B 12 PO) Take by mouth daily.   Yes Historical Provider, MD  fexofenadine (ALLEGRA) 180 MG tablet Take 90 mg by mouth daily as needed.    Yes Historical Provider, MD    fluticasone (FLONASE) 50 MCG/ACT nasal spray Place 1 spray into the nose daily as needed.  09/18/12  Yes Historical Provider, MD  ibuprofen (ADVIL,MOTRIN) 200 MG tablet Takes 2-3 tablets as needed.   Yes Historical Provider, MD  Loratadine (CLARITIN PO) Take 1 tablet by mouth daily as needed.    Yes Historical Provider, MD  nitroGLYCERIN (NITROSTAT) 0.4 MG SL tablet Place 1 tablet (0.4 mg total) under the tongue every 5 (five) minutes as needed for chest pain. 08/28/14  Yes Ryan M Dunn, PA-C  Nutritional Supplements (DHEA PO) Take 5 mg by mouth. Take two tablets in the am   Yes Historical Provider, MD  omeprazole (PRILOSEC) 40 MG capsule Take 40 mg by mouth daily.   Yes Historical Provider, MD  PRESCRIPTION MEDICATION Place 1 tablet vaginally 2 (two) times a week. Tuesday and Saturdays E3 1mg  and Testosterone 1mg  compound   Yes Historical Provider, MD  progesterone (PROMETRIUM) 100 MG capsule Take 1 capsule (100 mg total) by mouth daily. 11/27/15  Yes Megan P Johnson, DO  propranolol (INDERAL) 20 MG tablet Take 1 tablet (20 mg total) by mouth 3 (three) times daily as needed. Patient taking differently:  Take 20 mg by mouth 3 (three) times daily as needed (rapid heartbeat).  05/22/12  Yes Minna Merritts, MD  sodium chloride (MURO 128) 5 % ophthalmic solution Place 1 drop into both eyes daily.   Yes Historical Provider, MD    Allergies as of 12/12/2015 - Review Complete 12/12/2015  Allergen Reaction Noted  . Cephalosporins Swelling 08/18/2015  . Tape Rash 05/07/2014  . Cefdinir Swelling   . Codeine Nausea And Vomiting 04/25/2012  . Entex lq [phenylephrine-guaifenesin]  04/25/2012  . Flagyl [metronidazole] Swelling 05/07/2014  . Guaifenesin & derivatives Other (See Comments) 04/25/2012  . Lactose intolerance (gi) Other (See Comments) 07/01/2014  . Naproxen sodium    . Other Other (See Comments) 08/18/2015  . Oxycodone Nausea And Vomiting 04/25/2012  . Penicillins    . Succinylcholine Other  (See Comments) 06/30/2014  . Sulfonamide derivatives    . Tetracyclines & related Other (See Comments) 06/30/2014  . Wheat bran  12/12/2015  . Pseudoephedrine Rash 08/18/2015    Family History  Problem Relation Age of Onset  . COPD Mother     alive @ 65  . Stroke Father     died @ 9  . Breast cancer Maternal Aunt 70    Social History   Social History  . Marital Status: Married    Spouse Name: N/A  . Number of Children: N/A  . Years of Education: N/A   Occupational History  . Not on file.   Social History Main Topics  . Smoking status: Never Smoker   . Smokeless tobacco: Not on file     Comment: tobacco use -no  . Alcohol Use: No  . Drug Use: No  . Sexual Activity: Not on file   Other Topics Concern  . Not on file   Social History Narrative   Lives in [place] with husband.  Works out regularly.  Works as a Geophysicist/field seismologist - owns own business.     Review of Systems: See HPI, otherwise negative ROS  Physical Exam: BP 120/50 mmHg  Pulse 71  Temp(Src) 99.9 F (37.7 C) (Temporal)  Resp 16  Ht 5\' 3"  (1.6 m)  Wt 171 lb (77.565 kg)  BMI 30.30 kg/m2  SpO2 98% General:   Alert,  pleasant and cooperative in NAD Head:  Normocephalic and atraumatic. Neck:  Supple; no masses or thyromegaly. Lungs:  Clear throughout to auscultation.    Heart:  Regular rate and rhythm. Abdomen:  Soft, nontender and nondistended. Normal bowel sounds, without guarding, and without rebound.   Neurologic:  Alert and  oriented x4;  grossly normal neurologically.  Impression/Plan: Wendy Kelly is here for an endoscopy to be performed for dysphagia  Risks, benefits, limitations, and alternatives regarding  endoscopy have been reviewed with the patient.  Questions have been answered.  All parties agreeable.   Lucilla Lame, MD  12/18/2015, 9:14 AM

## 2015-12-18 NOTE — Anesthesia Preprocedure Evaluation (Signed)
Anesthesia Evaluation  Patient identified by MRN, date of birth, ID band Patient awake    Airway Mallampati: I  TM Distance: >3 FB Neck ROM: Full    Dental   Pulmonary    Pulmonary exam normal        Cardiovascular + Peripheral Vascular Disease  Normal cardiovascular exam+ dysrhythmias (AV node dysfunction) + pacemaker      Neuro/Psych Anxiety Depression TIA   GI/Hepatic   Endo/Other    Renal/GU      Musculoskeletal   Abdominal   Peds  Hematology   Anesthesia Other Findings   Reproductive/Obstetrics                             Anesthesia Physical Anesthesia Plan  ASA: III  Anesthesia Plan: MAC   Post-op Pain Management:    Induction: Intravenous  Airway Management Planned:   Additional Equipment:   Intra-op Plan:   Post-operative Plan:   Informed Consent: I have reviewed the patients History and Physical, chart, labs and discussed the procedure including the risks, benefits and alternatives for the proposed anesthesia with the patient or authorized representative who has indicated his/her understanding and acceptance.   Dental advisory given  Plan Discussed with: CRNA  Anesthesia Plan Comments:         Anesthesia Quick Evaluation

## 2015-12-19 ENCOUNTER — Encounter: Payer: Self-pay | Admitting: Gastroenterology

## 2015-12-23 ENCOUNTER — Ambulatory Visit (INDEPENDENT_AMBULATORY_CARE_PROVIDER_SITE_OTHER): Payer: BLUE CROSS/BLUE SHIELD | Admitting: Cardiovascular Disease

## 2015-12-23 ENCOUNTER — Encounter: Payer: Self-pay | Admitting: Gastroenterology

## 2015-12-23 ENCOUNTER — Encounter: Payer: Self-pay | Admitting: Cardiovascular Disease

## 2015-12-23 VITALS — BP 92/64 | HR 81 | Ht 64.0 in | Wt 167.8 lb

## 2015-12-23 DIAGNOSIS — I951 Orthostatic hypotension: Secondary | ICD-10-CM | POA: Diagnosis not present

## 2015-12-23 DIAGNOSIS — I441 Atrioventricular block, second degree: Secondary | ICD-10-CM

## 2015-12-23 DIAGNOSIS — I7121 Aneurysm of the ascending aorta, without rupture: Secondary | ICD-10-CM

## 2015-12-23 DIAGNOSIS — I712 Thoracic aortic aneurysm, without rupture: Secondary | ICD-10-CM | POA: Diagnosis not present

## 2015-12-23 DIAGNOSIS — R002 Palpitations: Secondary | ICD-10-CM | POA: Diagnosis not present

## 2015-12-23 DIAGNOSIS — Z950 Presence of cardiac pacemaker: Secondary | ICD-10-CM | POA: Diagnosis not present

## 2015-12-23 NOTE — Progress Notes (Signed)
Patient Wendy Kelly: Wendy Kelly, female   DOB: Sep 19, 1955, 60 y.o.   MRN: [id number] Cardiology Office Note  Date:  12/23/2015   Wendy Kelly:  Wendy Kelly, DOB [birth year], MRN [id number]  PCP:  Wendy Liter, DO   Chief Complaint  Patient presents with  . other    "doing well."  Meds reviewed by the patient verbally.     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 Wendy Kelly presents for routine followup of her chest pain and aneurysm  history of orthostasis , which Wendy Kelly prefers to manage with fluids.    Declined medications Previous echocardiogram showing moderately dilated ascending aorta, suspected bicuspid aortic valve.  Minimal change over the past several years, possibly up from 4.6 cm now to 4.8 cm over 5 years.  In follow-up today, Wendy Kelly reports that Wendy Kelly is doing relatively well. Occasional palpitations, Tolerated battery change out for her pacemaker with no complications Taking care of her mother who lives with her since the end of 2016 Blood pressure runs low at times, treated with fluids, frequent snacking Does not wear compression hose or abdominal binder, does not want medications  EKG on today's visit shows normal sinus rhythm with rate 76 bpm, no significant ST or T-wave changes  Other past medical history reviewed  Wendy Kelly was seen in December 2015 in the hospital for chest pain. Echocardiogram was unchanged, chest pain felt to be noncardiac.  Echocardiogram May and December 2015, relatively don't change. Results reviewed with her  CT scan in November 2013. 4.7 cm ascending aorta at that time  Wendy Kelly has  had low potassium in the past on visits to the emergency room for chest pain  Last stress test was April 2013 performed for chest pain. Equivocal study, no large regions of ischemia, some attenuation artifact noted.  episode of tachycardia followed by chest pain 05/18/2012. Workup in the hospital was essentially negative. Wendy Kelly had workup including carotid ultrasound that showed no significant disease.   Previous lab work shows total cholesterol 151, LDL 84, HDL 55, TSH 1.8  PMH:   has a past medical history of AV block, 2nd degree; Syncope; AA (aortic aneurysm) ([place]); Depression; Expressive aphasia; Facial numbness; Anxiety; Chest pain; Bicuspid aortic valve; Low blood pressure; GERD (gastroesophageal reflux disease); and Presence of permanent cardiac pacemaker.  PSH:    Past Surgical History  Procedure Laterality Date  . Pacemaker insertion  2006    Medtronic Adapta [id number]  . Cataract  2004  . Tonsillectomy    . Bladder tack    . Cholecystectomy  2000  . Vaginal hysterectomy    . Abdominal wall mesh  removal    . Ep implantable device N/A 06/04/2015    Procedure: PPM Generator Changeout;  Surgeon: Deboraha Sprang, MD;  Location: [facility];  Service: Cardiovascular;  Laterality: N/A;  . Esophagogastroduodenoscopy (egd) with propofol N/A 12/18/2015    Procedure: ESOPHAGOGASTRODUODENOSCOPY (EGD) WITH gastric biopsy and dilation;  Surgeon: Lucilla Lame, MD;  Location: [facility];  Service: Endoscopy;  Laterality: N/A;    Current Outpatient Prescriptions  Medication Sig Dispense Refill  . aspirin 81 MG tablet Take 81 mg by mouth daily.    Marland Kitchen  b complex vitamins tablet Take 1 tablet by mouth daily.    . Calcium 75 MG TABS Take 1 tablet by mouth daily.     Marland Kitchen CRANBERRY PO Take by mouth daily.    . Cyanocobalamin (VITAMIN B 12 PO) Take by mouth daily.    Marland Kitchen dexlansoprazole (DEXILANT) 60 MG capsule Take 60 mg by mouth daily.    . fexofenadine (ALLEGRA) 180 MG tablet Take 90 mg by  mouth daily as needed.     . fluticasone (FLONASE) 50 MCG/ACT nasal spray Place 1 spray into the nose daily as needed.     Marland Kitchen ibuprofen (ADVIL,MOTRIN) 200 MG tablet Takes 2-3 tablets as needed.    . Loratadine (CLARITIN PO) Take 1 tablet by mouth daily as needed.     . nitroGLYCERIN (NITROSTAT) 0.4 MG SL tablet Place 1 tablet (0.4 mg total) under the tongue every 5 (five) minutes as needed for chest pain. 25 tablet 6  . Nutritional Supplements (DHEA PO) Take 5 mg by mouth. Take two tablets in the am    . PRESCRIPTION MEDICATION Place 1 tablet vaginally 2 (two) times a week. Tuesday and Saturdays E3 1mg  and Testosterone 1mg  compound    . progesterone (PROMETRIUM) 100 MG capsule Take 1 capsule (100 mg total) by mouth daily. 30 capsule 6  . propranolol (INDERAL) 20 MG tablet Take 1 tablet (20 mg total) by mouth 3 (three) times daily as needed. (Patient taking differently: Take 20 mg by mouth 3 (three) times daily as needed (rapid heartbeat). ) 60 tablet 3  . sodium chloride (MURO 128) 5 % ophthalmic solution Place 1 drop into both eyes daily.     No current facility-administered medications for this visit.     Allergies:   Cephalosporins; Tape; Cefdinir; Codeine; Entex lq; Flagyl; Guaifenesin & derivatives; Lactose intolerance (gi); Naproxen sodium; Other; Oxycodone; Penicillins; Succinylcholine; Sulfonamide derivatives; Tetracyclines & related; Wheat bran; and Pseudoephedrine   Social History:  The patient  reports that Wendy Kelly has never smoked. Wendy Kelly does not have any smokeless tobacco history on file. Wendy Kelly reports that Wendy Kelly does not drink alcohol or use illicit drugs.   Family History:   family history includes Breast cancer (age of onset: 43) in her maternal aunt; COPD in her mother; Stroke in her father.    Review of Systems: Review of Systems  Constitutional: Negative.   Respiratory: Negative.   Cardiovascular: Positive for palpitations.  Gastrointestinal: Negative.   Musculoskeletal:  Negative.   Neurological: Positive for dizziness.  Psychiatric/Behavioral: Negative.   All other systems reviewed and are negative.    PHYSICAL EXAM: VS:  BP 92/64 mmHg  Pulse 81  Ht 5\' 4"  (1.626 m)  Wt 167 lb 12 oz (76.091 kg)  BMI 28.78 kg/m2 , BMI Body mass index is 28.78 kg/(m^2). GEN: Well nourished, well developed, in no acute distress HEENT: normal Neck: no JVD, carotid bruits, or masses Cardiac: RRR; no murmurs, rubs, or gallops,no edema  Respiratory:  clear to auscultation bilaterally, normal work of breathing GI: soft, nontender, nondistended, + BS MS: no deformity or atrophy Skin: warm and dry, no rash Neuro:  Strength and sensation are intact Psych: euthymic mood, full affect    Recent Labs: 04/14/2015: ALT 30; TSH 2.760 05/28/2015: BUN 14; Platelets 181; Potassium 3.9; Sodium 142 12/03/2015: Creatinine, Ser 0.70    Lipid Panel Lab Results  Component Value Date   CHOL 156 04/14/2015   HDL 59 04/14/2015   LDLCALC 86 04/14/2015   TRIG 55 04/14/2015  Wt Readings from Last 3 Encounters:  12/23/15 167 lb 12 oz (76.091 kg)  12/18/15 171 lb (77.565 kg)  09/16/15 174 lb 12 oz (79.266 kg)       ASSESSMENT AND PLAN:  Palpitations -  Rare episodes, not particularly bothered No changes to her medications, pacemaker in place  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onic -  Managed by Dr. Caryl Comes, recent generator change out  Ascending aortic aneurysm ([place]) - Repeat echocardiogram scheduled end of 2017, beginning of 2018  Orthostatic hypotension Rare symptoms, improved with fluid hydration, frequent snacking Does not want medications or abdominal binder  AV block, 2nd degree History of pacemaker   Total encounter time more than 15 minutes  Greater than 50% was spent in counseling and coordination of care with the patient    Disposition:   F/U  6 months   Orders Placed This Encounter  Procedures  . EKG 12-Lead  . ECHOCARDIOGRAM COMPLETE     Signed, Esmond Plants,  M.D., Ph.D. 12/23/2015  [place], [place]

## 2015-12-23 NOTE — Patient Instructions (Signed)
You are doing well. No medication changes were made.  Echo in late dec 2017 for ascending aorta aneurysm  Please call us if you have new issues that need to be addressed before your next appt.  Your physician wants you to follow-up in: 12 months You will receive a reminder letter in the mail two months in advance. If you don't receive a letter, please call our office to schedule the follow-up appointment.

## 2015-12-25 LAB — CUP PACEART REMOTE DEVICE CHECK
Battery Impedance: 100 Ohm
Battery Remaining Longevity: 163 mo
Battery Voltage: 2.79 V
Brady Statistic AP VP Percent: 0 %
Implantable Lead Location: 753859
Implantable Lead Model: 4092
Implantable Lead Model: 4592
Lead Channel Setting Pacing Amplitude: 2 V
Lead Channel Setting Sensing Sensitivity: 2.8 mV
MDC IDC LEAD IMPLANT DT: 20061128
MDC IDC LEAD IMPLANT DT: 20061128
MDC IDC LEAD LOCATION: 753860
MDC IDC MSMT LEADCHNL RA IMPEDANCE VALUE: 614 Ohm
MDC IDC MSMT LEADCHNL RV IMPEDANCE VALUE: 745 Ohm
MDC IDC SESS DTM: 20170606080823
MDC IDC SET LEADCHNL RV PACING AMPLITUDE: 2.5 V
MDC IDC SET LEADCHNL RV PACING PULSEWIDTH: 0.4 ms
MDC IDC STAT BRADY AP VS PERCENT: 6 %
MDC IDC STAT BRADY AS VP PERCENT: 0 %
MDC IDC STAT BRADY AS VS PERCENT: 94 %

## 2015-12-29 ENCOUNTER — Encounter: Payer: Self-pay | Admitting: Gastroenterology

## 2015-12-30 ENCOUNTER — Other Ambulatory Visit: Payer: Self-pay

## 2015-12-30 ENCOUNTER — Telehealth: Payer: Self-pay | Admitting: Gastroenterology

## 2015-12-30 MED ORDER — DEXLANSOPRAZOLE 60 MG PO CPDR: 60.0000 mg | DELAYED_RELEASE_CAPSULE | Freq: Every day | ORAL | Status: DC

## 2015-12-30 NOTE — Telephone Encounter (Signed)
Needs Dexilant 610 mg symptoms not going away but better and she would like her results.

## 2015-12-30 NOTE — Telephone Encounter (Signed)
Pt notified of EGD results. Rx refill for Dexilant sent to [facility].

## 2015-12-31 ENCOUNTER — Encounter: Payer: Self-pay | Admitting: Cardiology

## 2016-01-01 NOTE — Telephone Encounter (Signed)
Patient would like for you to call her regarding the [place]. She said she had some things going on last night.

## 2016-01-02 NOTE — Telephone Encounter (Signed)
Spoke with pt regarding her symptoms and dexilant. Pt stated that her head was feeling like she had a lot of pressure in it. She wasn't sure if she has a sinus issue or if it is a side effect of the medication. Advised pt to take Dexilant every other day to see if this helps. Pt will let me know.

## 2016-01-06 ENCOUNTER — Other Ambulatory Visit: Payer: Self-pay

## 2016-01-06 MED ORDER — ESOMEPRAZOLE MAGNESIUM 40 MG PO CPDR: 40.0000 mg | DELAYED_RELEASE_CAPSULE | Freq: Every day | ORAL | Status: DC

## 2016-01-20 DIAGNOSIS — F458 Other somatoform disorders: Secondary | ICD-10-CM | POA: Diagnosis not present

## 2016-01-20 DIAGNOSIS — R1312 Dysphagia, oropharyngeal phase: Secondary | ICD-10-CM | POA: Diagnosis not present

## 2016-02-02 ENCOUNTER — Telehealth: Payer: Self-pay

## 2016-02-02 NOTE — Telephone Encounter (Signed)
Pharmacy is requesting  E3 1mg  +testost 1mg  vaginal tablet Insert 1 tablet vaginally twice a week

## 2016-02-02 NOTE — Telephone Encounter (Signed)
Rx printed and sent to her pharmacy

## 2016-02-19 ENCOUNTER — Encounter: Payer: Self-pay | Admitting: Family Medicine

## 2016-02-19 ENCOUNTER — Ambulatory Visit (INDEPENDENT_AMBULATORY_CARE_PROVIDER_SITE_OTHER): Payer: BLUE CROSS/BLUE SHIELD | Admitting: Family Medicine

## 2016-02-19 VITALS — BP 114/75 | HR 89 | Temp 98.7°F | Wt 175.0 lb

## 2016-02-19 DIAGNOSIS — N898 Other specified noninflammatory disorders of vagina: Secondary | ICD-10-CM

## 2016-02-19 DIAGNOSIS — R399 Unspecified symptoms and signs involving the genitourinary system: Secondary | ICD-10-CM | POA: Diagnosis not present

## 2016-02-19 LAB — UA/M W/RFLX CULTURE, ROUTINE
BILIRUBIN UA: NEGATIVE
Glucose, UA: NEGATIVE
KETONES UA: NEGATIVE
Leukocytes, UA: NEGATIVE
NITRITE UA: NEGATIVE
Protein, UA: NEGATIVE
RBC, UA: NEGATIVE
Specific Gravity, UA: 1.01 (ref 1.005–1.030)
UUROB: 0.2 mg/dL (ref 0.2–1.0)
pH, UA: 5 (ref 5.0–7.5)

## 2016-02-19 NOTE — Patient Instructions (Signed)
Follow up as needed

## 2016-02-19 NOTE — Progress Notes (Signed)
   BP 114/75   Pulse 89   Temp 98.7 F (37.1 C)   Wt 175 lb (79.4 kg)   SpO2 98%   BMI 30.04 kg/m    Subjective:    Patient ID: Wendy Kelly, female    DOB: 10-Jan-1956, 60 y.o.   MRN: [id number]  HPI: Wendy Kelly is a 60 y.o. female  Chief Complaint  Patient presents with  . Urinary Tract Infection    started Saturday. No burning during urination but after, lower pelvis spasms, pressure, some urgency. She has also noticed that when she uses the vaginal medication, it seems raw.   Patient presents with 5 day history of vaginal irritation and suprapubic pressure/cramping as if she were on her period (post menopausal, s/p hysterectomy with b/l oopherectomy). Denies any dysuria, hematuria, or frequency, but wanting to make sure the irritation isn't due to a UTI before her busy weekend of family coming into town. Denies any change in BMs, or N/V. Denies fever or back pain. No abnormal discharge. Has been on hormone vaginal suppositories for several years with no issues, but about a week ago picked up a new supply and states symptoms started since then.   Relevant past medical, surgical, family and social history reviewed and updated as indicated. Interim medical history since our last visit reviewed. Allergies and medications reviewed and updated.  Review of Systems  Constitutional: Negative.   Respiratory: Negative.   Cardiovascular: Negative.   Gastrointestinal: Negative.   Genitourinary: Positive for pelvic pain and vaginal pain.  Neurological: Negative.   Psychiatric/Behavioral: Negative.     Per HPI unless specifically indicated above     Objective:    BP 114/75   Pulse 89   Temp 98.7 F (37.1 C)   Wt 175 lb (79.4 kg)   SpO2 98%   BMI 30.04 kg/m   Wt Readings from Last 3 Encounters:  02/19/16 175 lb (79.4 kg)  12/23/15 167 lb 12 oz (76.1 kg)  12/18/15 171 lb (77.6 kg)    Physical Exam  Constitutional: She is oriented to person, place, and time. She appears  well-developed and well-nourished.  HENT:  Head: Atraumatic.  Eyes: Conjunctivae are normal. No scleral icterus.  Neck: Normal range of motion. Neck supple.  Cardiovascular: Normal rate and normal heart sounds.   Pulmonary/Chest: Effort normal. No respiratory distress.  Abdominal: Soft. Bowel sounds are normal.  Musculoskeletal: Normal range of motion.  Neurological: She is alert and oriented to person, place, and time.  Skin: Skin is warm and dry.  Psychiatric: She has a normal mood and affect. Her behavior is normal.  Nursing note and vitals reviewed.     Assessment & Plan:   Problem List Items Addressed This Visit    None    Visit Diagnoses    Vaginal irritation    -  Primary   U/A WNL, possibly due to compounded suppository, possibly made slightly differently than normal or the recent manufacturer change on her progesterone tabs   Relevant Orders   UA/M w/rflx Culture, Routine (STAT)    Patient is very sensitive to medications, so her symptoms could be attributable to slight variations in her hormone replacement therapy. She will speak with [facility] about getting a new batch of the suppositories to try. She will follow up with how that goes.  Follow up plan: Return if symptoms worsen or fail to improve.

## 2016-02-23 ENCOUNTER — Other Ambulatory Visit: Payer: Self-pay | Admitting: Family Medicine

## 2016-02-23 ENCOUNTER — Encounter: Payer: Self-pay | Admitting: Family Medicine

## 2016-02-23 NOTE — Telephone Encounter (Signed)
Routing to provider  

## 2016-03-16 ENCOUNTER — Ambulatory Visit (INDEPENDENT_AMBULATORY_CARE_PROVIDER_SITE_OTHER): Payer: BLUE CROSS/BLUE SHIELD | Admitting: *Deleted

## 2016-03-16 DIAGNOSIS — Z950 Presence of cardiac pacemaker: Secondary | ICD-10-CM

## 2016-03-16 DIAGNOSIS — I441 Atrioventricular block, second degree: Secondary | ICD-10-CM

## 2016-03-16 NOTE — Progress Notes (Signed)
Remote pacemaker transmission.   

## 2016-03-19 ENCOUNTER — Encounter: Payer: Self-pay | Admitting: Cardiology

## 2016-03-23 LAB — CUP PACEART REMOTE DEVICE CHECK
Battery Impedance: 100 Ohm
Battery Remaining Longevity: 162 mo
Battery Voltage: 2.79 V
Brady Statistic AP VP Percent: 0 %
Brady Statistic AP VS Percent: 5 %
Brady Statistic AS VP Percent: 0 %
Brady Statistic AS VS Percent: 95 %
Date Time Interrogation Session: 20170905091543
Implantable Lead Implant Date: 20061128
Implantable Lead Implant Date: 20061128
Implantable Lead Location: 753859
Implantable Lead Location: 753860
Implantable Lead Model: 4092
Implantable Lead Model: 4592
Lead Channel Impedance Value: 522 Ohm
Lead Channel Impedance Value: 783 Ohm
Lead Channel Pacing Threshold Amplitude: 0.375 V
Lead Channel Pacing Threshold Amplitude: 1 V
Lead Channel Pacing Threshold Pulse Width: 0.4 ms
Lead Channel Pacing Threshold Pulse Width: 0.4 ms
Lead Channel Sensing Intrinsic Amplitude: 2.8 mV
Lead Channel Sensing Intrinsic Amplitude: 5.6 mV
Lead Channel Setting Pacing Amplitude: 2 V
Lead Channel Setting Pacing Amplitude: 2.5 V
Lead Channel Setting Pacing Pulse Width: 0.4 ms
Lead Channel Setting Sensing Sensitivity: 4 mV

## 2016-04-06 DIAGNOSIS — L570 Actinic keratosis: Secondary | ICD-10-CM | POA: Diagnosis not present

## 2016-04-06 DIAGNOSIS — Z85828 Personal history of other malignant neoplasm of skin: Secondary | ICD-10-CM | POA: Diagnosis not present

## 2016-04-12 ENCOUNTER — Telehealth: Payer: Self-pay

## 2016-04-12 NOTE — Telephone Encounter (Signed)
Will do at follow up tomorrow.

## 2016-04-12 NOTE — Telephone Encounter (Signed)
Refill on E3 1mg  + testost 1mg  vag tablet

## 2016-04-13 ENCOUNTER — Encounter: Payer: Self-pay | Admitting: Family Medicine

## 2016-04-13 ENCOUNTER — Ambulatory Visit (INDEPENDENT_AMBULATORY_CARE_PROVIDER_SITE_OTHER): Payer: BLUE CROSS/BLUE SHIELD | Admitting: Family Medicine

## 2016-04-13 VITALS — BP 122/77 | HR 70 | Temp 98.8°F | Ht 63.1 in | Wt 178.7 lb

## 2016-04-13 DIAGNOSIS — Z1231 Encounter for screening mammogram for malignant neoplasm of breast: Secondary | ICD-10-CM

## 2016-04-13 DIAGNOSIS — Z23 Encounter for immunization: Secondary | ICD-10-CM

## 2016-04-13 DIAGNOSIS — Z0000 Encounter for general adult medical examination without abnormal findings: Secondary | ICD-10-CM

## 2016-04-13 DIAGNOSIS — Z1239 Encounter for other screening for malignant neoplasm of breast: Secondary | ICD-10-CM

## 2016-04-13 DIAGNOSIS — F430 Acute stress reaction: Secondary | ICD-10-CM

## 2016-04-13 DIAGNOSIS — F458 Other somatoform disorders: Secondary | ICD-10-CM | POA: Diagnosis not present

## 2016-04-13 NOTE — Patient Instructions (Addendum)
Td Vaccine (Tetanus and Diphtheria): What You Need to Know  1. Why get vaccinated?  Tetanus  and diphtheria are very serious diseases. They are rare in the [place] today, but people who do become infected often have severe complications. Td vaccine is used to protect adolescents and adults from both of these diseases.  Both tetanus and diphtheria are infections caused by bacteria. Diphtheria spreads from person to person through coughing or sneezing. Tetanus-causing bacteria enter the body through cuts, scratches, or wounds.  TETANUS (Lockjaw) causes painful muscle tightening and stiffness, usually all over the body.  · It can lead to tightening of muscles in the head and neck so you can't open your mouth, swallow, or sometimes even breathe. Tetanus kills about 1 out of every 10 people who are infected even after receiving the best medical care.  DIPHTHERIA can cause a thick coating to form in the back of the throat.  · It can lead to breathing problems, paralysis, heart failure, and death.  Before vaccines, as many as 200,000 cases of diphtheria and hundreds of cases of tetanus were reported in the [place] each year. Since vaccination began, reports of cases for both diseases have dropped by about 99%.  2. Td vaccine  Td vaccine can protect adolescents and adults from tetanus and diphtheria. Td is usually given as a booster dose every 10 years but it can also be given earlier after a severe and dirty wound or burn.  Another vaccine, called Tdap, which protects against pertussis in addition to tetanus and diphtheria, is sometimes recommended instead of Td vaccine.  Your doctor or the person giving you the vaccine can give you more information.  Td may safely be given at the same time as other vaccines.  3. Some people should not get this vaccine  · A person who has ever had a life-threatening allergic reaction after a previous dose of any tetanus or diphtheria containing vaccine, OR has a severe allergy  to any part of this vaccine, should not get Td vaccine. Tell the person giving the vaccine about any severe allergies.  · Talk to your doctor if you:    have seizures or another nervous system problem,    had severe pain or swelling after any vaccine containing diphtheria or tetanus,    ever had a condition called Guillain Barre Syndrome (GBS),    aren't feeling well on the day the shot is scheduled.  4. Risks of a vaccine reaction  With any medicine, including vaccines, there is a chance of side effects. These are usually mild and go away on their own. Serious reactions are also possible but are rare.  Most people who get Td vaccine do not have any problems with it.  Mild Problems  following Td vaccine:  (Did not interfere with activities)  · Pain where the shot was given (about 8 people in 10)  · Redness or swelling where the shot was given (about 1 person in 4)  · Mild fever (rare)  · Headache (about 1 person in 4)  · Tiredness (about 1 person in 4)  Moderate Problems following Td vaccine:  (Interfered with activities, but did not require medical attention)  · Fever over 102°F (rare)  Severe Problems  following Td vaccine:  (Unable to perform usual activities; required medical attention)  · Swelling, severe pain, bleeding and/or redness in the arm where the shot was given (rare).  Problems that could happen after any vaccine:  · People sometimes   faint after a medical procedure, including vaccination. Sitting or lying down for about 15 minutes can help prevent fainting, and injuries caused by a fall. Tell your doctor if you feel dizzy, or have vision changes or ringing in the ears.  · Some people get severe pain in the shoulder and have difficulty moving the arm where a shot was given. This happens very rarely.  · Any medication can cause a severe allergic reaction. Such reactions from a vaccine are very rare, estimated at fewer than 1 in a million doses, and would happen within a few minutes to a few hours after  the vaccination.  As with any medicine, there is a very remote chance of a vaccine causing a serious injury or death.  The safety of vaccines is always being monitored. For more information, visit: www.cdc.gov/vaccinesafety/  5. What if there is a serious reaction?  What should I look for?  · Look for anything that concerns you, such as signs of a severe allergic reaction, very high fever, or unusual behavior.  Signs of a severe allergic reaction can include hives, swelling of the face and throat, difficulty breathing, a fast heartbeat, dizziness, and weakness. These would usually start a few minutes to a few hours after the vaccination.  What should I do?  · If you think it is a severe allergic reaction or other emergency that can't wait, call 9-1-1 or get the person to the nearest hospital. Otherwise, call your doctor.  · Afterward, the reaction should be reported to the Vaccine Adverse Event Reporting System (VAERS). Your doctor might file this report, or you can do it yourself through the VAERS web site at www.vaers.hhs.gov, or by calling 1-800-822-7967.  VAERS does not give medical advice.  6. The National Vaccine Injury Compensation Program  The National Vaccine Injury Compensation Program (VICP) is a federal program that was created to compensate people who may have been injured by certain vaccines.  Persons who believe they may have been injured by a vaccine can learn about the program and about filing a claim by calling 1-800-338-2382 or visiting the VICP website at www.hrsa.gov/vaccinecompensation. There is a time limit to file a claim for compensation.  7. How can I learn more?  · Ask your doctor. He or she can give you the vaccine package insert or suggest other sources of information.  · Call your local or state health department.  · Contact the Centers for Disease Control and Prevention (CDC):    Call 1-800-232-4636 (1-800-CDC-INFO)    Visit CDC's website at www.cdc.gov/vaccines  CDC Td Vaccine VIS  (09/04/13)     This information is not intended to replace advice given to you by your health care provider. Make sure you discuss any questions you have with your health care provider.     Document Released: 04/25/2006 Document Revised: 07/19/2014 Document Reviewed: 10/10/2013  [employer]vier Interactive Patient Education ©2016 [employer]

## 2016-04-13 NOTE — Progress Notes (Signed)
BP 122/77 (BP Location: Left Arm, Patient Position: Sitting, Cuff Size: Normal)   Pulse 70   Temp 98.8 F (37.1 C)   Ht 5' 3.1" (1.603 m)   Wt 178 lb 11.2 oz (81.1 kg)   SpO2 99%   BMI 31.56 kg/m    Subjective:    Patient ID: Wendy Kelly, female    DOB: [birth year], 60 y.o.   MRN: [id number]  HPI: Wendy Kelly is a 60 y.o. female presenting on 04/13/2016 for comprehensive medical examination. Current medical complaints include:  Currently living with her mother. Causing her a lot of stress. Feeling like she is not sleeping as well.   She currently lives with: mother and husband Menopausal Symptoms: no  Depression Screen done today and results listed below:  Depression screen [facility] 2/9 04/13/2016 04/08/2015  Decreased Interest 0 0  Down, Depressed, Hopeless 0 0  PHQ - 2 Score 0 0    Past Medical History:  Past Medical History:  Diagnosis Date  . AA (aortic aneurysm) ([place])    a. 04/2011 Echo: Ao Root: 4.1cm, Asc Ao 4.7cm.  . Anxiety   . AV block, 2nd degree    a. 05/2005 - s/p MDT Adapta ADDR01 Dual Chamber PPM  . Bicuspid aortic valve   . Chest pain    a. 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  . Depression   . Expressive aphasia    a. ongoing since 04/2011 - seen by neurology - ? TIA vs. Migraine  . Facial numbness    a. ongoing since 04/2011 - seen by neurology - ? TIA vs. Migraine  . GERD (gastroesophageal reflux disease)   . Low blood pressure   . Presence of permanent cardiac pacemaker   . Syncope    a. 04/2011 Echo: EF 55-65%, No RWMA, Gr 1 DD.    Surgical History:  Past Surgical History:  Procedure Laterality Date  . ABDOMINAL WALL MESH  REMOVAL    . bladder tack    . cataract  2004  . CHOLECYSTECTOMY  2000  . EP IMPLANTABLE DEVICE N/A 06/04/2015   Procedure: PPM Generator Changeout;  Surgeon: Deboraha Sprang, MD;  Location: [place] [facility];  Service: Cardiovascular;  Laterality: N/A;  . ESOPHAGOGASTRODUODENOSCOPY (EGD) WITH PROPOFOL N/A 12/18/2015   Procedure:  ESOPHAGOGASTRODUODENOSCOPY (EGD) WITH gastric biopsy and dilation;  Surgeon: Lucilla Lame, MD;  Location: [place] [facility];  Service: Endoscopy;  Laterality: N/A;  . PACEMAKER INSERTION  2006   Medtronic Adapta ADDR01  . TONSILLECTOMY    . VAGINAL HYSTERECTOMY      Medications:  Current Outpatient Prescriptions on File Prior to Visit  Medication Sig  . aspirin 81 MG tablet Take 81 mg by mouth daily.  Marland Kitchen b complex vitamins tablet Take 1 tablet by mouth daily.  . Calcium 75 MG TABS Take 1 tablet by mouth daily.   Marland Kitchen esomeprazole (NEXIUM) 40 MG capsule Take 1 capsule (40 mg total) by mouth daily before breakfast.  . fexofenadine (ALLEGRA) 180 MG tablet Take 90 mg by mouth daily as needed.   Marland Kitchen ibuprofen (ADVIL,MOTRIN) 200 MG tablet Takes 2-3 tablets as needed.  . Loratadine (CLARITIN PO) Take 1 tablet by mouth daily as needed.   . midodrine (PROAMATINE) 10 MG tablet Take 10 mg by mouth as needed.  . nitroGLYCERIN (NITROSTAT) 0.4 MG SL tablet Place 1 tablet (0.4 mg total) under the tongue every 5 (five) minutes as needed for chest pain.  . Nutritional Supplements (DHEA PO) Take 5 mg by mouth.  Take two tablets in the am  . PRESCRIPTION MEDICATION Place 1 tablet vaginally 2 (two) times a week. Tuesday and Saturdays E3 1mg  and Testosterone 1mg  compound  . progesterone (PROMETRIUM) 100 MG capsule Take 1 capsule (100 mg total) by mouth daily.  . propranolol (INDERAL) 20 MG tablet Take 1 tablet (20 mg total) by mouth 3 (three) times daily as needed. (Patient taking differently: Take 20 mg by mouth 3 (three) times daily as needed (rapid heartbeat). )  . sodium chloride (MURO 128) 5 % ophthalmic solution Place 1 drop into both eyes daily.   No current facility-administered medications on file prior to visit.     Allergies:  Allergies  Allergen Reactions  . Cephalosporins Swelling  . Phenylephrine-Guaifenesin Anaphylaxis    Night terrors  . Tape Rash  . Cefdinir Swelling       . Codeine  Nausea And Vomiting  . Dexilant [Dexlansoprazole]     Pressure in head  . Entex Lq [Phenylephrine-Guaifenesin]     Night terrors  . Flagyl [Metronidazole] Swelling  . Guaifenesin & Derivatives Other (See Comments)    Night terrors  . Lactose Intolerance (Gi) Other (See Comments)    GI upset  . Naproxen Sodium     Itching/rash  . Other Other (See Comments)    Flu vaccine:  Weakness, dizziness, tia type symptoms.    . Oxycodone Nausea And Vomiting    And itching  . Penicillins     Rash/hives  . Succinylcholine Other (See Comments)    Trouble waking up  . Sulfonamide Derivatives     Rash/itching  . Tetracyclines & Related Other (See Comments)    Unkown  . Wheat Bran   . Lactobacillus Itching  . Pseudoephedrine Rash  . Sulfa Antibiotics Rash    Social History:  Social History   Social History  . Marital status: Married    Spouse name: N/A  . Number of children: N/A  . Years of education: N/A   Occupational History  . Not on file.   Social History Main Topics  . Smoking status: Never Smoker  . Smokeless tobacco: Never Used     Comment: tobacco use -no  . Alcohol use No  . Drug use: No  . Sexual activity: Not on file   Other Topics Concern  . Not on file   Social History Narrative   Lives in [place] with husband.  Works out regularly.  Works as a Geophysicist/field seismologist - owns own business.    History  Smoking Status  . Never Smoker  Smokeless Tobacco  . Never Used    Comment: tobacco use -no   History  Alcohol Use No    Family History:  Family History  Problem Relation Age of Onset  . COPD Mother     alive @ 60  . Stroke Father     died @ 55  . Breast cancer Maternal Aunt 70    Past medical history, surgical history, medications, allergies, family history and social history reviewed with patient today and changes made to appropriate areas of the chart.   Review of Systems  Constitutional: Negative.   HENT: Negative.        Recently  diagnosed with globus- seeing Doctor at [facility] today  Eyes: Negative.   Respiratory: Negative.   Cardiovascular: Negative.   Gastrointestinal: Positive for abdominal pain (chronic) and heartburn. Negative for blood in stool, constipation, diarrhea, melena, nausea and vomiting.  Genitourinary: Negative.   Musculoskeletal: Negative.  Skin: Negative.   Neurological: Negative.   Endo/Heme/Allergies: Negative.   Psychiatric/Behavioral: Negative for depression, hallucinations, memory loss, substance abuse and suicidal ideas. The patient is nervous/anxious and has insomnia.    All other ROS negative except what is listed above and in the HPI.      Objective:    BP 122/77 (BP Location: Left Arm, Patient Position: Sitting, Cuff Size: Normal)   Pulse 70   Temp 98.8 F (37.1 C)   Ht 5' 3.1" (1.603 m)   Wt 178 lb 11.2 oz (81.1 kg)   SpO2 99%   BMI 31.56 kg/m   Wt Readings from Last 3 Encounters:  04/13/16 178 lb 11.2 oz (81.1 kg)  02/19/16 175 lb (79.4 kg)  12/23/15 167 lb 12 oz (76.1 kg)    Physical Exam  Constitutional: She is oriented to person, place, and time. She appears well-developed and well-nourished. No distress.  HENT:  Head: Normocephalic and atraumatic.  Right Ear: Hearing, tympanic membrane, external ear and ear canal normal.  Left Ear: Hearing, tympanic membrane, external ear and ear canal normal.  Nose: Nose normal.  Mouth/Throat: Uvula is midline, oropharynx is clear and moist and mucous membranes are normal. No oropharyngeal exudate.  Eyes: Conjunctivae, EOM and lids are normal. Pupils are equal, round, and reactive to light. Right eye exhibits no discharge. Left eye exhibits no discharge. No scleral icterus.  Neck: Normal range of motion. Neck supple. No JVD present. No tracheal deviation present. No thyromegaly present.  Cardiovascular: Normal rate, regular rhythm, normal heart sounds and intact distal pulses.  Exam reveals no gallop and no friction rub.   No murmur  heard. Pulmonary/Chest: Effort normal and breath sounds normal. No stridor. No respiratory distress. She has no wheezes. She has no rales. She exhibits no tenderness. Right breast exhibits no inverted nipple, no mass, no nipple discharge, no skin change and no tenderness. Left breast exhibits no inverted nipple, no mass, no nipple discharge, no skin change and no tenderness. Breasts are symmetrical.  Abdominal: Soft. Bowel sounds are normal. She exhibits no distension and no mass. There is no tenderness. There is no rebound and no guarding.  Genitourinary:  Genitourinary Comments: Deferred with shared decision making.  Musculoskeletal: Normal range of motion. She exhibits no edema, tenderness or deformity.  Lymphadenopathy:    She has no cervical adenopathy.  Neurological: She is alert and oriented to person, place, and time. She has normal reflexes. She displays normal reflexes. No cranial nerve deficit. She exhibits normal muscle tone. Coordination normal.  Skin: Skin is warm, dry and intact. No rash noted. She is not diaphoretic. No erythema. No pallor.  Psychiatric: She has a normal mood and affect. Her speech is normal and behavior is normal. Judgment and thought content normal. Cognition and memory are normal.  Nursing note and vitals reviewed.   Results for orders placed or performed in visit on 03/16/16  Implantable device - remote  Result Value Ref Range   Date Time Interrogation Session [id number]    Pulse Generator Manufacturer MERM    Pulse Gen Model [id number] Adapta    Pulse Gen Serial Number [id number]    Implantable Pulse Generator Type Implantable Pulse Generator    Implantable Pulse Generator Implant Date [id number]    Implantable Lead Manufacturer MERM    Implantable Lead Model 4092 CapSure SP Novus    Implantable Lead Serial Number [id number]    Implantable Lead Implant Date [id number]    Implantable Lead Location [phone]  Implantable Lead Manufacturer [facility]     Implantable Lead Model [id number]    Implantable Lead Serial Number [id number]    Implantable Lead Implant Date [id number]    Implantable Lead Location [phone]    Lead Channel Setting Sensing Sensitivity 4.00 mV   Lead Channel Setting Pacing Amplitude 2.000 V   Lead Channel Setting Pacing Pulse Width 0.40 ms   Lead Channel Setting Pacing Amplitude 2.500 V   Lead Channel Impedance Value 522 ohm   Lead Channel Sensing Intrinsic Amplitude 2.8 mV   Lead Channel Pacing Threshold Amplitude 0.375 V   Lead Channel Pacing Threshold Pulse Width 0.40 ms   Lead Channel Impedance Value 783 ohm   Lead Channel Sensing Intrinsic Amplitude 5.6 mV   Lead Channel Pacing Threshold Amplitude 1.000 V   Lead Channel Pacing Threshold Pulse Width 0.40 ms   Battery Status OK    Battery Remaining Longevity 162 mo   Battery Voltage 2.79 V   Battery Impedance 100 ohm   Brady Statistic AP VP Percent 0 %   Brady Statistic AS VP Percent 0 %   Brady Statistic AP VS Percent 5 %   Brady Statistic AS VS Percent 95 %   Eval Rhythm SR       Assessment & Plan:   Problem List Items Addressed This Visit    None    Visit Diagnoses    Routine general medical examination at a health care facility    -  Primary   Up to date on vaccines/declines. Screening labs to be checked tomorrow. Mammogram ordered. Pap N/A. Colonoscopy up to date. Continue diet and exercise.   Relevant Orders   CBC with Differential/Platelet   Comprehensive metabolic panel   Lipid Panel w/o Chol/HDL Ratio   TSH   UA/M w/rflx Culture, Routine   Acute stress reaction       Will consider trazodone for sleep. Will continue to monitor. Will call with any concerns or changes.    Relevant Orders   CBC with Differential/Platelet   Comprehensive metabolic panel   TSH   Screening for breast cancer       Mammogram ordered today.   Relevant Orders   MM DIGITAL SCREENING BILATERAL   Need for TD vaccine       Given today. Declined Tdap.   Relevant Orders   Td  : Tetanus/diphtheria >7yo Preservative  free (Completed)       Follow up plan: Return tomorrow for labs, 1 year for physical.   LABORATORY TESTING:  - Pap smear: not applicable  IMMUNIZATIONS:   - Tdap: Tetanus vaccination status reviewed: Td vaccination indicated and given today. - Influenza: Refused - Pneumovax: Not applicable - Prevnar: Not applicable - Zostavax vaccine: Refused  SCREENING: -Mammogram: Ordered today  - Colonoscopy: Up to date  - Bone Density: Not applicable   PATIENT COUNSELING:   Advised to take 1 mg of folate supplement per day if capable of pregnancy.   Sexuality: Discussed sexually transmitted diseases, partner selection, use of condoms, avoidance of unintended pregnancy  and contraceptive alternatives.   Advised to avoid cigarette smoking.  I discussed with the patient that most people either abstain from alcohol or drink within safe limits (<=14/week and <=4 drinks/occasion for males, <=7/weeks and <= 3 drinks/occasion for females) and that the risk for alcohol disorders and other health effects rises proportionally with the number of drinks per week and how often a drinker exceeds daily limits.  Discussed cessation/primary prevention of drug use and  availability of treatment for abuse.   Diet: Encouraged to adjust caloric intake to maintain  or achieve ideal body weight, to reduce intake of dietary saturated fat and total fat, to limit sodium intake by avoiding high sodium foods and not adding table salt, and to maintain adequate dietary potassium and calcium preferably from fresh fruits, vegetables, and low-fat dairy products.    stressed the importance of regular exercise  Injury prevention: Discussed safety belts, safety helmets, smoke detector, smoking near bedding or upholstery.   Dental health: Discussed importance of regular tooth brushing, flossing, and dental visits.    NEXT PREVENTATIVE PHYSICAL DUE IN 1 YEAR. Return tomorrow for labs,  1 year for physical.

## 2016-04-14 ENCOUNTER — Other Ambulatory Visit: Payer: BLUE CROSS/BLUE SHIELD

## 2016-04-14 DIAGNOSIS — F430 Acute stress reaction: Secondary | ICD-10-CM

## 2016-04-14 DIAGNOSIS — Z0000 Encounter for general adult medical examination without abnormal findings: Secondary | ICD-10-CM

## 2016-04-14 LAB — UA/M W/RFLX CULTURE, ROUTINE
Bilirubin, UA: NEGATIVE
Glucose, UA: NEGATIVE
Ketones, UA: NEGATIVE
Leukocytes, UA: NEGATIVE
Nitrite, UA: NEGATIVE
Protein, UA: NEGATIVE
RBC, UA: NEGATIVE
Specific Gravity, UA: 1.015 (ref 1.005–1.030)
Urobilinogen, Ur: 0.2 mg/dL (ref 0.2–1.0)
pH, UA: 7 (ref 5.0–7.5)

## 2016-04-15 ENCOUNTER — Encounter: Payer: Self-pay | Admitting: Family Medicine

## 2016-04-15 LAB — CBC WITH DIFFERENTIAL/PLATELET
BASOS ABS: 0 10*3/uL (ref 0.0–0.2)
BASOS: 1 %
EOS (ABSOLUTE): 0.1 10*3/uL (ref 0.0–0.4)
Eos: 2 %
Hematocrit: 41.1 % (ref 34.0–46.6)
Hemoglobin: 13.8 g/dL (ref 11.1–15.9)
IMMATURE GRANS (ABS): 0 10*3/uL (ref 0.0–0.1)
IMMATURE GRANULOCYTES: 0 %
LYMPHS: 20 %
Lymphocytes Absolute: 1.2 10*3/uL (ref 0.7–3.1)
MCH: 29.7 pg (ref 26.6–33.0)
MCHC: 33.6 g/dL (ref 31.5–35.7)
MCV: 88 fL (ref 79–97)
MONOCYTES: 8 %
Monocytes Absolute: 0.5 10*3/uL (ref 0.1–0.9)
NEUTROS PCT: 69 %
Neutrophils Absolute: 4 10*3/uL (ref 1.4–7.0)
PLATELETS: 176 10*3/uL (ref 150–379)
RBC: 4.65 x10E6/uL (ref 3.77–5.28)
RDW: 13.9 % (ref 12.3–15.4)
WBC: 5.8 10*3/uL (ref 3.4–10.8)

## 2016-04-15 LAB — TSH: TSH: 1.72 u[IU]/mL (ref 0.450–4.500)

## 2016-04-15 LAB — COMPREHENSIVE METABOLIC PANEL
ALT: 32 IU/L (ref 0–32)
AST: 25 IU/L (ref 0–40)
Albumin/Globulin Ratio: 2 (ref 1.2–2.2)
Albumin: 4.2 g/dL (ref 3.6–4.8)
Alkaline Phosphatase: 85 IU/L (ref 39–117)
BUN/Creatinine Ratio: 20 (ref 12–28)
BUN: 14 mg/dL (ref 8–27)
Bilirubin Total: 1.1 mg/dL (ref 0.0–1.2)
CALCIUM: 9.2 mg/dL (ref 8.7–10.3)
CO2: 26 mmol/L (ref 18–29)
CREATININE: 0.71 mg/dL (ref 0.57–1.00)
Chloride: 105 mmol/L (ref 96–106)
GFR, EST AFRICAN AMERICAN: 106 mL/min/{1.73_m2} (ref >59)
GFR, EST NON AFRICAN AMERICAN: 92 mL/min/{1.73_m2} (ref >59)
GLUCOSE: 93 mg/dL (ref 65–99)
Globulin, Total: 2.1 g/dL (ref 1.5–4.5)
Potassium: 4.1 mmol/L (ref 3.5–5.2)
Sodium: 143 mmol/L (ref 134–144)
TOTAL PROTEIN: 6.3 g/dL (ref 6.0–8.5)

## 2016-04-15 LAB — LIPID PANEL W/O CHOL/HDL RATIO
Cholesterol, Total: 144 mg/dL (ref 100–199)
HDL: 53 mg/dL (ref >39)
LDL Calculated: 78 mg/dL (ref 0–99)
Triglycerides: 63 mg/dL (ref 0–149)
VLDL Cholesterol Cal: 13 mg/dL (ref 5–40)

## 2016-04-20 ENCOUNTER — Telehealth: Payer: Self-pay

## 2016-04-20 MED ORDER — DHEA 10 MG PO TABS: 10.0000 mg | ORAL_TABLET | ORAL | 3 refills | Status: DC

## 2016-04-20 NOTE — Telephone Encounter (Signed)
Refill on DHEA 5mg  take two tablets daily

## 2016-04-27 ENCOUNTER — Telehealth: Payer: Self-pay | Admitting: Family Medicine

## 2016-04-27 ENCOUNTER — Other Ambulatory Visit: Payer: Self-pay | Admitting: Family Medicine

## 2016-04-27 MED ORDER — FLUCONAZOLE 150 MG PO TABS: 150.0000 mg | ORAL_TABLET | Freq: Every day | ORAL | 0 refills | Status: DC

## 2016-04-27 NOTE — Telephone Encounter (Signed)
Rx sent to her pharmacy 

## 2016-04-27 NOTE — Telephone Encounter (Signed)
Pt called stated she has a yeast infection. Wants to know if Diflucan can called into the pharmacy for her. Pharm is [facility] [address] in [place]. Thanks.

## 2016-05-07 DIAGNOSIS — Z961 Presence of intraocular lens: Secondary | ICD-10-CM | POA: Diagnosis not present

## 2016-05-17 DIAGNOSIS — R1310 Dysphagia, unspecified: Secondary | ICD-10-CM | POA: Diagnosis not present

## 2016-05-17 DIAGNOSIS — F458 Other somatoform disorders: Secondary | ICD-10-CM | POA: Diagnosis not present

## 2016-05-17 DIAGNOSIS — R1312 Dysphagia, oropharyngeal phase: Secondary | ICD-10-CM | POA: Diagnosis not present

## 2016-05-17 DIAGNOSIS — K224 Dyskinesia of esophagus: Secondary | ICD-10-CM | POA: Diagnosis not present

## 2016-05-21 ENCOUNTER — Ambulatory Visit
Admission: RE | Admit: 2016-05-21 | Discharge: 2016-05-21 | Disposition: A | Discharge status: Home / Self Care | Payer: BLUE CROSS/BLUE SHIELD | Source: Ambulatory Visit | Attending: Family Medicine | Admitting: Family Medicine

## 2016-05-21 DIAGNOSIS — Z1231 Encounter for screening mammogram for malignant neoplasm of breast: Secondary | ICD-10-CM | POA: Diagnosis not present

## 2016-05-21 DIAGNOSIS — Z1239 Encounter for other screening for malignant neoplasm of breast: Secondary | ICD-10-CM

## 2016-06-29 ENCOUNTER — Ambulatory Visit (INDEPENDENT_AMBULATORY_CARE_PROVIDER_SITE_OTHER): Payer: BLUE CROSS/BLUE SHIELD | Admitting: Internal Medicine

## 2016-06-29 ENCOUNTER — Encounter: Payer: Self-pay | Admitting: Internal Medicine

## 2016-06-29 VITALS — BP 110/70 | HR 74 | Ht 64.0 in | Wt 183.8 lb

## 2016-06-29 DIAGNOSIS — Z950 Presence of cardiac pacemaker: Secondary | ICD-10-CM | POA: Diagnosis not present

## 2016-06-29 DIAGNOSIS — I441 Atrioventricular block, second degree: Secondary | ICD-10-CM | POA: Diagnosis not present

## 2016-06-29 LAB — CUP PACEART INCLINIC DEVICE CHECK
Brady Statistic AP VS Percent: 5 %
Brady Statistic AS VP Percent: 0 %
Brady Statistic AS VS Percent: 95 %
Date Time Interrogation Session: 20171219144028
Implantable Lead Implant Date: 20061128
Implantable Lead Implant Date: 20061128
Lead Channel Impedance Value: 741 Ohm
Lead Channel Pacing Threshold Amplitude: 1 V
Lead Channel Pacing Threshold Pulse Width: 0.4 ms
Lead Channel Pacing Threshold Pulse Width: 0.4 ms
Lead Channel Sensing Intrinsic Amplitude: 11.2 mV
Lead Channel Setting Pacing Amplitude: 2 V
Lead Channel Setting Sensing Sensitivity: 4 mV
MDC IDC LEAD LOCATION: 753859
MDC IDC LEAD LOCATION: 753860
MDC IDC LEAD MODEL: 4592
MDC IDC MSMT BATTERY IMPEDANCE: 114 Ohm
MDC IDC MSMT BATTERY REMAINING LONGEVITY: 157 mo
MDC IDC MSMT BATTERY VOLTAGE: 2.79 V
MDC IDC MSMT LEADCHNL RA IMPEDANCE VALUE: 500 Ohm
MDC IDC MSMT LEADCHNL RA PACING THRESHOLD AMPLITUDE: 0.5 V
MDC IDC MSMT LEADCHNL RA SENSING INTR AMPL: 4 mV
MDC IDC PG IMPLANT DT: 20161123
MDC IDC SET LEADCHNL RV PACING AMPLITUDE: 2.5 V
MDC IDC SET LEADCHNL RV PACING PULSEWIDTH: 0.4 ms
MDC IDC STAT BRADY AP VP PERCENT: 0 %

## 2016-06-29 NOTE — Progress Notes (Signed)
skf      Patient Care Team: Valerie Roys, DO as PCP - General (Family Medicine) Minna Merritts, MD as Consulting Physician (Cardiology)   HPI  Wendy Kelly is a 60 y.o. female Seen in followup for a pacemaker implanted for syncope with intermittent second degree heart block. She received a Medtronic adapa device in 2006   Her device reverted to ERI 10/16 and she underwent generator replacement 11/16.   Blood pressures have been better. She notes that low blood pressure is the strongest correlate with her dizziness. We reviewed her daytime activities. She showers in the mornings. Washes her hair at the same time. She does not note other activity correlations i.e. Exposure to heat or alcohol     She was admitted 12/15 for atypical chest pain at which time stable ascending aortic aneurysm was noted      DATE TEST    12/15 echo   EF50-55  mod ascending aortic dilitation 36 mm  1/16 myoview   EF 40-"normal"  no ischemia  12/15 CTA 4.6 cm  rec semi annual evaluation    Aortic root 4.7-4.8  Dr. 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 AA (aortic aneurysm) ([place])    a. 04/2011 Echo: Ao Root: 4.1cm, Asc Ao 4.7cm.  . Anxiety   . AV block, 2nd degree    a. 05/2005 - s/p MDT Adapta ADDR01 Dual Chamber PPM  . Bicuspid aortic valve   . Chest pain    a. 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  . Depression   . Expressive aphasia    a. ongoing since 04/2011 - seen by neurology - ? TIA vs. Migraine  . Facial numbness    a. ongoing since 04/2011 - seen by neurology - ? TIA vs. Migraine  . GERD (gastroesophageal reflux disease)   . Low blood pressure   . Presence of permanent cardiac pacemaker   . Syncope    a. 04/2011 Echo: EF 55-65%, No RWMA, Gr 1 DD.    Past Surgical  History:  Procedure Laterality Date  . ABDOMINAL WALL MESH  REMOVAL    . bladder tack    . cataract  2004  . CHOLECYSTECTOMY  2000  . EP IMPLANTABLE DEVICE N/A 06/04/2015   Procedure: PPM Generator Changeout;  Surgeon: Deboraha Sprang, MD;  Location: [place] [facility];  Service: Cardiovascular;  Laterality: N/A;  . ESOPHAGOGASTRODUODENOSCOPY (EGD) WITH PROPOFOL N/A 12/18/2015   Procedure: ESOPHAGOGASTRODUODENOSCOPY (EGD) WITH gastric biopsy and dilation;  Surgeon: Lucilla Lame, MD;  Location: [place];  Service: Endoscopy;  Laterality: N/A;  . PACEMAKER INSERTION  2006   Medtronic Adapta ADDR01  . TONSILLECTOMY    . VAGINAL HYSTERECTOMY      Current Outpatient Prescriptions  Medication Sig Dispense Refill  . aspirin 81 MG tablet Take 81 mg by mouth daily.    Marland Kitchen b complex vitamins tablet Take 1 tablet by mouth daily.    . Calcium 75 MG TABS Take 1 tablet by mouth daily.     Marland Kitchen DHEA 10 MG TABS Take 10 mg by mouth every morning. 90 tablet 3  . fexofenadine (ALLEGRA) 180 MG tablet Take 90 mg by mouth daily as needed.     Marland Kitchen ibuprofen (ADVIL,MOTRIN) 200 MG tablet Takes 2-3 tablets  as needed.    . Loratadine (CLARITIN PO) Take 1 tablet by mouth daily as needed.     . midodrine (PROAMATINE) 10 MG tablet Take 10 mg by mouth as needed.    . nitroGLYCERIN (NITROSTAT) 0.4 MG SL tablet Place 1 tablet (0.4 mg total) under the tongue every 5 (five) minutes as needed for chest pain. 25 tablet 6  . NON FORMULARY Vaginal tablet (estrogen and testosterone) 2-3 times weekly.    Marland Kitchen PRESCRIPTION MEDICATION Place 1 tablet vaginally 2 (two) times a week. Tuesday and Saturdays E3 1mg  and Testosterone 1mg  compound    . progesterone (PROMETRIUM) 100 MG capsule Take 1 capsule (100 mg total) by mouth daily. 30 capsule 6  . propranolol (INDERAL) 20 MG tablet Take 1 tablet (20 mg total) by mouth 3 (three) times daily as needed. (Patient taking differently: Take 20 mg by mouth 3 (three) times daily as needed  (rapid heartbeat). ) 60 tablet 3  . sodium chloride (MURO 128) 5 % ophthalmic solution Place 1 drop into both eyes daily.     No current facility-administered medications for this visit.     Allergies  Allergen Reactions  . Cephalosporins Swelling  . Phenylephrine-Guaifenesin Anaphylaxis    Night terrors  . Tape Rash  . Cefdinir Swelling       . Codeine Nausea And Vomiting  . Dexilant [Dexlansoprazole]     Pressure in head  . Entex Lq [Phenylephrine-Guaifenesin]     Night terrors  . Flagyl [Metronidazole] Swelling  . Guaifenesin & Derivatives Other (See Comments)    Night terrors  . Lactose Intolerance (Gi) Other (See Comments)    GI upset  . Naproxen Sodium     Itching/rash  . Other Other (See Comments)    Flu vaccine:  Weakness, dizziness, tia type symptoms.    . Oxycodone Nausea And Vomiting    And itching  . Penicillins     Rash/hives  . Succinylcholine Other (See Comments)    Trouble waking up  . Sulfonamide Derivatives     Rash/itching  . Tetracyclines & Related Other (See Comments)    Unkown  . Wheat Bran   . Lactobacillus Itching  . Pseudoephedrine Rash  . Sulfa Antibiotics Rash    Review of Systems negative except from HPI and PMH  Physical Exam BP 110/70 (BP Location: Left Arm, Patient Position: Sitting, Cuff Size: Normal)   Pulse 74   Ht 5\' 4"  (1.626 m)   Wt 183 lb 12 oz (83.3 kg)   BMI 31.54 kg/m  Well developed and well nourished in no acute distress HENT normal E scleral and icterus clear Neck Supple JVP flat; carotids brisk and full Clear to ausculation Device pocket well healed; without hematoma or erythema.  There is no tethering Regular rate and rhythm, no murmurs gallops or rub Soft with active bowel sounds No clubbing cyanosis no Edema Alert and oriented, grossly normal motor and sensory function Skin Warm and Dry  ECG demonstrates sinus rhythm at  74  Intervals 07/19/36 Axis is leftward at -49   Assessment and   Plan  Syncope  Intermittent second degree heart block-   Pacemaker-Medtronic  The patient's device was interrogated.  The information was reviewed. No changes were made in the programming.    Hypotension   Ascending aortic aneurysm  TIA   Her last CTA was 2015. Aneurysm is measured 4.6-7. Marland Kitchen At that juncture of the recommendation was semiannual imaging and CT surgery referral. Neither of these has  been done. She has been undergoing echo follow-up.  She is not on beta blockers because of low blood pressure  Given the fact that this has been present since her mid 18s, I also wonder about genetic aortopathy and whether genetic screening and family surveillance is not appropriate. I'll review these with Dr. Deidre Ala was away this week  There is been no interval syncope.  No interval atrial fibrillation      N

## 2016-06-29 NOTE — Patient Instructions (Signed)

## 2016-07-13 ENCOUNTER — Ambulatory Visit (INDEPENDENT_AMBULATORY_CARE_PROVIDER_SITE_OTHER): Payer: BLUE CROSS/BLUE SHIELD

## 2016-07-13 ENCOUNTER — Other Ambulatory Visit: Payer: Self-pay

## 2016-07-13 DIAGNOSIS — I712 Thoracic aortic aneurysm, without rupture: Secondary | ICD-10-CM | POA: Diagnosis not present

## 2016-07-13 DIAGNOSIS — I7121 Aneurysm of the ascending aorta, without rupture: Secondary | ICD-10-CM

## 2016-08-02 ENCOUNTER — Telehealth: Payer: Self-pay

## 2016-08-02 NOTE — Telephone Encounter (Signed)
Left message for pt to call back to schedule appt if she would like.

## 2016-08-02 NOTE — Telephone Encounter (Signed)
-----   Message from Minna Merritts, MD sent at 08/01/2016 10:31 AM EST ----- Recently seen by Dr. Caryl Comes who recommended we have a discussion concerning genetic causes of her dilated ascending aorta Would she like a visit to discuss? TG  ----- Message ----- From: Deboraha Sprang, MD Sent: 07/23/2016  10:02 AM To: Emily Filbert, RN, Minna Merritts, MD, #  H  Yes  Thanks  He was going to followup with hre  ----- Message ----- From: Emily Filbert, RN Sent: 07/20/2016  10:58 AM To: Deboraha Sprang, MD, Emily Filbert, RN  Did you ever review her aortic aneurysm / genetic issues with Dr. Rockey Situ?

## 2016-08-24 DIAGNOSIS — R1312 Dysphagia, oropharyngeal phase: Secondary | ICD-10-CM | POA: Diagnosis not present

## 2016-08-24 DIAGNOSIS — F458 Other somatoform disorders: Secondary | ICD-10-CM | POA: Diagnosis not present

## 2016-09-28 ENCOUNTER — Ambulatory Visit (INDEPENDENT_AMBULATORY_CARE_PROVIDER_SITE_OTHER): Payer: BLUE CROSS/BLUE SHIELD | Admitting: *Deleted

## 2016-09-28 DIAGNOSIS — I441 Atrioventricular block, second degree: Secondary | ICD-10-CM | POA: Diagnosis not present

## 2016-09-28 NOTE — Progress Notes (Signed)
Remote pacemaker transmission.   

## 2016-09-29 ENCOUNTER — Encounter: Payer: Self-pay | Admitting: Cardiology

## 2016-09-29 LAB — CUP PACEART REMOTE DEVICE CHECK
Battery Impedance: 115 Ohm
Battery Remaining Longevity: 157 mo
Brady Statistic AP VS Percent: 7 %
Brady Statistic AS VS Percent: 93 %
Implantable Lead Implant Date: 20061128
Implantable Lead Location: 753859
Lead Channel Impedance Value: 743 Ohm
Lead Channel Pacing Threshold Amplitude: 1 V
Lead Channel Pacing Threshold Pulse Width: 0.4 ms
Lead Channel Setting Pacing Amplitude: 2 V
Lead Channel Setting Pacing Pulse Width: 0.4 ms
Lead Channel Setting Sensing Sensitivity: 4 mV
MDC IDC LEAD IMPLANT DT: 20061128
MDC IDC LEAD LOCATION: 753860
MDC IDC MSMT BATTERY VOLTAGE: 2.79 V
MDC IDC MSMT LEADCHNL RA IMPEDANCE VALUE: 592 Ohm
MDC IDC MSMT LEADCHNL RA PACING THRESHOLD AMPLITUDE: 0.375 V
MDC IDC MSMT LEADCHNL RV PACING THRESHOLD PULSEWIDTH: 0.4 ms
MDC IDC PG IMPLANT DT: 20161123
MDC IDC SESS DTM: 20180320083624
MDC IDC SET LEADCHNL RV PACING AMPLITUDE: 2.5 V
MDC IDC STAT BRADY AP VP PERCENT: 0 %
MDC IDC STAT BRADY AS VP PERCENT: 0 %

## 2016-11-15 ENCOUNTER — Ambulatory Visit (INDEPENDENT_AMBULATORY_CARE_PROVIDER_SITE_OTHER): Payer: BLUE CROSS/BLUE SHIELD | Admitting: Family Medicine

## 2016-11-15 ENCOUNTER — Encounter: Payer: Self-pay | Admitting: Family Medicine

## 2016-11-15 VITALS — BP 114/77 | HR 64 | Temp 98.6°F | Wt 185.7 lb

## 2016-11-15 DIAGNOSIS — J301 Allergic rhinitis due to pollen: Secondary | ICD-10-CM | POA: Diagnosis not present

## 2016-11-15 DIAGNOSIS — R591 Generalized enlarged lymph nodes: Secondary | ICD-10-CM

## 2016-11-15 MED ORDER — MONTELUKAST SODIUM 10 MG PO TABS: 10.0000 mg | ORAL_TABLET | Freq: Every day | ORAL | 3 refills | Status: DC

## 2016-11-15 NOTE — Progress Notes (Signed)
BP 114/77 (BP Location: Left Arm, Patient Position: Sitting, Cuff Size: Normal)   Pulse 64   Temp 98.6 F (37 C)   Wt 185 lb 11.2 oz (84.2 kg)   SpO2 96%   BMI 31.88 kg/m    Subjective:    Patient ID: Wendy Kelly, female    DOB: 1956/04/22, 61 y.o.   MRN: [id number]  HPI: Wendy Kelly is a 61 y.o. female  Chief Complaint  Patient presents with  . URI    X 1 week, worse over the weekend   UPPER RESPIRATORY TRACT INFECTION Duration: about a week Worst symptom: face feels swollen and itching Fever: no Cough: yes Shortness of breath: no Wheezing: no Chest pain: no Chest tightness: yes Chest congestion: no Nasal congestion: yes Runny nose: yes Post nasal drip: yes Sneezing: yes Sore throat: no Swollen glands: no Sinus pressure: yes Headache: yes Face pain: yes Toothache: yes Ear pain: no  Ear pressure: yes R>L Eyes red/itching:yes Eye drainage/crusting: yes  Vomiting: no Rash: no  Itching: yes Fatigue: yes Sick contacts: no Strep contacts: no  Context: worse Recurrent sinusitis: no Relief with OTC cold/cough medications: no  Treatments attempted: claritin, allegra, tylenol   LUMP Duration: Since January  Location: R side of her neck Onset: gradual Painful: yes Discomfort: yes Status:  not changing- better Trauma: no Redness: no Bruising: no Recent infection: yes Swollen lymph nodes: yes Requesting removal: no   Relevant past medical, surgical, family and social history reviewed and updated as indicated. Interim medical history since our last visit reviewed. Allergies and medications reviewed and updated.  Review of Systems  Constitutional: Negative.   HENT: Positive for congestion, ear pain, facial swelling, postnasal drip, rhinorrhea, sinus pressure and sneezing. Negative for dental problem, drooling, ear discharge, hearing loss, mouth sores, nosebleeds, sinus pain, sore throat, tinnitus, trouble swallowing and voice change.   Respiratory:  Negative.   Cardiovascular: Negative.   Psychiatric/Behavioral: Negative.     Per HPI unless specifically indicated above     Objective:    BP 114/77 (BP Location: Left Arm, Patient Position: Sitting, Cuff Size: Normal)   Pulse 64   Temp 98.6 F (37 C)   Wt 185 lb 11.2 oz (84.2 kg)   SpO2 96%   BMI 31.88 kg/m   Wt Readings from Last 3 Encounters:  11/15/16 185 lb 11.2 oz (84.2 kg)  06/29/16 183 lb 12 oz (83.3 kg)  04/13/16 178 lb 11.2 oz (81.1 kg)    Physical Exam  Constitutional: She is oriented to person, place, and time. She appears well-developed and well-nourished. No distress.  HENT:  Head: Normocephalic and atraumatic.  Right Ear: Hearing, tympanic membrane, external ear and ear canal normal.  Left Ear: Hearing, tympanic membrane, external ear and ear canal normal.  Nose: Nose normal.  Mouth/Throat: Uvula is midline, oropharynx is clear and moist and mucous membranes are normal. No oropharyngeal exudate.  Eyes: Conjunctivae, EOM and lids are normal. Pupils are equal, round, and reactive to light. Right eye exhibits no discharge. Left eye exhibits no discharge. No scleral icterus.  Neck: Normal range of motion. Neck supple. No JVD present. No tracheal deviation present. No thyromegaly present.  Cardiovascular: Normal rate, regular rhythm, normal heart sounds and intact distal pulses.  Exam reveals no gallop and no friction rub.   No murmur heard. Pulmonary/Chest: Effort normal and breath sounds normal. No stridor. No respiratory distress. She has no wheezes. She has no rales. She exhibits no tenderness.  Musculoskeletal: Normal  range of motion.  Lymphadenopathy:    She has cervical adenopathy.  Neurological: She is alert and oriented to person, place, and time.  Skin: Skin is warm, dry and intact. No rash noted. She is not diaphoretic. No erythema. No pallor.  Psychiatric: She has a normal mood and affect. Her speech is normal and behavior is normal. Judgment and  thought content normal. Cognition and memory are normal.    Results for orders placed or performed in visit on 09/28/16  [facility] [place]  Result Value Ref Range   Date Time Interrogation Session [id number]    Pulse Generator Manufacturer MERM    Pulse Gen Model ADDRL1 Adapta    Pulse Gen Serial Number [id number] [facility] Name [place]    Implantable Pulse Generator Type Implantable Pulse Generator    Implantable Pulse Generator Implant Date [id number]    Implantable Lead Manufacturer MERM    Implantable Lead Model 4092 CapSure SP Novus    Implantable Lead Serial Number [id number]    Implantable Lead Implant Date [id number]    Implantable Lead Location [id number]    Implantable Lead Manufacturer MERM    Implantable Lead Model 4592 CapSure SP Novus    Implantable Lead Serial Number [id number]    Implantable Lead Implant Date [id number]    Implantable Lead Location [id number]    Lead Channel Setting Sensing Sensitivity 4.00 mV   Lead Channel Setting Pacing Amplitude 2.000 V   Lead Channel Setting Pacing Pulse Width 0.40 ms   Lead Channel Setting Pacing Amplitude 2.500 V   Lead Channel Impedance Value 592 ohm   Lead Channel Pacing Threshold Amplitude 0.375 V   Lead Channel Pacing Threshold Pulse Width 0.40 ms   Lead Channel Impedance Value 743 ohm   Lead Channel Pacing Threshold Amplitude 1.000 V   Lead Channel Pacing Threshold Pulse Width 0.40 ms   Battery Status OK    Battery Remaining Longevity 157 mo   Battery Voltage 2.79 V   Battery Impedance 115 ohm   Brady Statistic AP VP Percent 0 %   Brady Statistic AS VP Percent 0 %   Brady Statistic AP VS Percent 7 %   Brady Statistic AS VS Percent 93 %   Eval Rhythm As/Vs       Assessment & Plan:   Problem List Items Addressed This Visit    None    Visit Diagnoses    Seasonal allergic rhinitis due to pollen    -  Primary   Not being controlled with allegra. Cannot tollerate benadryl. Will start her on  singulair. Recommend OTC flonase. Call if not getting better or getting worse.    Lymphadenopathy       Will check CBC. Declines ultrasound at this time. Call with any concerns.    Relevant Orders   CBC with Differential/Platelet       Follow up plan: Return if symptoms worsen or fail to improve.

## 2016-11-16 LAB — CBC WITH DIFFERENTIAL/PLATELET
Basophils Absolute: 0 10*3/uL (ref 0.0–0.2)
Basos: 0 %
EOS (ABSOLUTE): 0.1 10*3/uL (ref 0.0–0.4)
EOS: 2 %
Hematocrit: 41.5 % (ref 34.0–46.6)
Hemoglobin: 14 g/dL (ref 11.1–15.9)
IMMATURE GRANULOCYTES: 0 %
Immature Grans (Abs): 0 10*3/uL (ref 0.0–0.1)
Lymphocytes Absolute: 1.3 10*3/uL (ref 0.7–3.1)
Lymphs: 23 %
MCH: 30 pg (ref 26.6–33.0)
MCHC: 33.7 g/dL (ref 31.5–35.7)
MCV: 89 fL (ref 79–97)
MONOCYTES: 8 %
Monocytes Absolute: 0.4 10*3/uL (ref 0.1–0.9)
NEUTROS PCT: 67 %
Neutrophils Absolute: 3.8 10*3/uL (ref 1.4–7.0)
Platelets: 178 10*3/uL (ref 150–379)
RBC: 4.67 x10E6/uL (ref 3.77–5.28)
RDW: 13.6 % (ref 12.3–15.4)
WBC: 5.6 10*3/uL (ref 3.4–10.8)

## 2016-12-15 NOTE — Progress Notes (Signed)
Patient ID: Wendy Kelly, female   DOB: 11-14-1955, 61 y.o.   MRN: [id number] Cardiology Office Note  Date:  12/16/2016   ID:  Wendy Kelly, DOB 1956-07-09, MRN [id number]  PCP:  Valerie Roys, DO   Chief Complaint  Patient presents with  . other    12 month follow up. Meds reviewed by the pt. verbally. 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  Chronic orthostasis , which she prefers to manage with fluids, previously Declined medications She presents for routine followup of her chest pain and ascending aorta dilation  Most recent Echocardiogram January 2018 Aortic root: The aortic root was dilated at 4.2cm. - Ascending aorta: The ascending aorta was dilated at 4.6cm. No significant change  Last CT chest December 2015, aorta 4.6 cm at that time No change compared to CT scan August 2011, at that time 4.6 x 4.8 cm  Son recently diagnosed with dilated ascending aorta 4.2 cm, he is [place] of state  She has some atypical chest pain, improved symptoms with accupuncture and chiropractic Some issues with swallowing  Occasional palpitations, Taking care of her mother who lives with her since the end of 2016 Still exercising Blood pressure running low frequently, treated with fluids, frequent snacking Previously declined beta blockers Does not wear compression hose or abdominal binder, does not want medications  CT scan neck reviewed images pulled up in the office today no  significant carotid disease, aortic arch disease/calcification  EKG on today's visit shows normal sinus rhythm with rate 60 bpm, no significant ST or T-wave changes  Other past medical history reviewed She was seen in December 2015 in the hospital for chest pain. Echocardiogram was unchanged, chest pain felt to be noncardiac. Echocardiogram May and December 2015, relatively no change.   CT scan in November 2013. 4.7 cm ascending aorta at that time  She has had low potassium in the past on visits to the emergency room for chest pain  Last stress test was April 2013 performed for chest pain. Equivocal study, no large regions of ischemia, some attenuation artifact noted.  episode of tachycardia followed by chest pain 05/18/2012. Workup in the hospital was essentially negative. She had workup including carotid ultrasound that showed no significant disease.   Previous lab work shows total cholesterol 151, LDL 84, HDL 55, TSH 1.8  PMH:   has a past medical history of AA (aortic aneurysm) ([place]); Anxiety; AV block, 2nd degree; Bicuspid aortic valve; Chest pain; Depression; Expressive aphasia; Facial numbness; GERD (gastroesophageal reflux disease); Low blood pressure; Presence of permanent cardiac pacemaker; and Syncope.  PSH:    Past Surgical History:  Procedure Laterality Date  . ABDOMINAL WALL MESH  REMOVAL    . bladder tack    . cataract  2004  . CHOLECYSTECTOMY  2000  . EP IMPLANTABLE DEVICE N/A 06/04/2015   Procedure: PPM Generator Changeout;  Surgeon: Deboraha Sprang, MD;  Location: [facility];  Service: Cardiovascular;  Laterality: N/A;  . ESOPHAGOGASTRODUODENOSCOPY (EGD) WITH PROPOFOL N/A  12/18/2015   Procedure: ESOPHAGOGASTRODUODENOSCOPY (EGD) WITH gastric biopsy and dilation;  Surgeon: Lucilla Lame, MD;  Location: [facility];  Service: Endoscopy;  Laterality: N/A;  . PACEMAKER INSERTION  2006   Medtronic Adapta ADDR01  . TONSILLECTOMY    . VAGINAL HYSTERECTOMY       Current Outpatient Prescriptions  Medication Sig Dispense Refill  . aspirin 81 MG tablet Take 81 mg by mouth daily.    Marland Kitchen b complex vitamins tablet Take 1 tablet by mouth daily.    . Calcium 75 MG TABS Take 1 tablet by mouth daily.     Marland Kitchen DHEA 10 MG TABS Take 10 mg by mouth every morning. 90 tablet 3  . fexofenadine (ALLEGRA) 180 MG tablet Take 90 mg by mouth daily as needed.     Marland Kitchen ibuprofen (ADVIL,MOTRIN) 200 MG tablet Takes 2-3 tablets as needed.    . montelukast (SINGULAIR) 10 MG tablet Take 1 tablet (10 mg total) by mouth at bedtime. 30 tablet 3  . nitroGLYCERIN (NITROSTAT) 0.4 MG SL tablet Place 1 tablet (0.4 mg total) under the tongue every 5 (five) minutes as needed for chest pain. 25 tablet 3  . NON FORMULARY Vaginal tablet (estrogen and testosterone) 2-3 times weekly.    Marland Kitchen PRESCRIPTION MEDICATION Place 1 tablet vaginally 2 (two) times a week. Tuesday and Saturdays E3 1mg  and Testosterone 1mg  compound    . propranolol (INDERAL) 20 MG tablet Take 1 tablet (20 mg total) by mouth 3 (three) times daily as needed. (Patient taking differently: Take 20 mg by mouth 3 (three) times daily as needed (rapid heartbeat). ) 60 tablet 3  . sodium chloride (MURO 128) 5 % ophthalmic solution Place 1 drop into both eyes daily.     No current facility-administered medications for this visit.      Allergies:   Cephalosporins; Phenylephrine-guaifenesin; Tape; Cefdinir; Codeine; Dexilant [dexlansoprazole]; Entex lq [phenylephrine-guaifenesin]; Flagyl [metronidazole]; Guaifenesin & derivatives; Lactose intolerance (gi); Naproxen sodium; Other; Oxycodone; Penicillins; Succinylcholine; Sulfonamide derivatives; Tetracyclines & related; Wheat bran; Lactobacillus; Pseudoephedrine; and Sulfa antibiotics   Social History:  The patient  reports that she has never smoked. She has never used smokeless tobacco. She reports that she does not drink alcohol or use drugs.   Family History:   family history includes  Breast cancer (age of onset: 67) in her maternal aunt; COPD in her mother; Stroke in her father.    Review of Systems: Review of Systems  Constitutional: Negative.   Respiratory: Negative.   Cardiovascular: Positive for palpitations.  Gastrointestinal: Negative.   Musculoskeletal: Negative.   Neurological: Positive for dizziness.  Psychiatric/Behavioral: Negative.   All other systems reviewed and are negative.    PHYSICAL EXAM: VS:  BP 124/80 (BP Location: Left Arm, Patient Position: Sitting, Cuff Size: Normal)   Pulse 60   Ht 5\' 4"  (1.626 m)   Wt 183 lb 4 oz (83.1 kg)   BMI 31.45 kg/m  , BMI Body mass index is 31.45 kg/m. GEN: Well nourished, well developed, in no acute distress  HEENT: normal  Neck: no JVD, carotid bruits, or masses Cardiac: RRR; 1+ SEM RSB,  no rubs, or gallops,no edema  Respiratory:  clear to auscultation bilaterally, normal work of breathing GI: soft, nontender, nondistended, + BS MS: no deformity or atrophy  Skin: warm and dry, no rash Neuro:  Strength and sensation are intact Psych: euthymic mood, full affect    Recent Labs: 04/14/2016: ALT 32; BUN 14; Creatinine, Ser 0.71; Potassium 4.1; Sodium 143;  TSH 1.720 11/15/2016: Hemoglobin 14.0; Platelets 178    Lipid Panel Lab Results  Component Value Date   CHOL 144 04/14/2016   HDL 53 04/14/2016   LDLCALC 78 04/14/2016   TRIG 63 04/14/2016      Wt Readings from Last 3 Encounters:  12/16/16 183 lb 4 oz (83.1 kg)  11/15/16 185 lb 11.2 oz (84.2 kg)  06/29/16 183 lb 12 oz (83.3 kg)       ASSESSMENT AND PLAN:  Ascending aortic aneurysm (HCC) - Recent echocardiogram with no significant change in ascending aorta dilatation compared to 10 years ago. CT scan of the chest pending for confirmation Likely genetic etiology given son with dilated ascending aorta 4.2 cm Long discussion with her today concerning management We have recommended she discuss her history with cardiothoracic surgery and  consider developing a plan. Consultation has been placed. She is inclined not to do any surgery unless there is signs of progression possibly up to 5 cm.   Palpitations -  No changes to her medications, pacemaker in place She has previously declined beta blockers secondary to hypotension  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onic -  Managed by Dr. Caryl Comes, previous  generator change out  Orthostatic hypotension Rare symptoms, improved with fluid hydration, frequent snacking Does not want medications or abdominal binder She has been reluctant to be on beta blockers or ARB  AV block, 2nd degree History of pacemaker    Total encounter time more than 25 minutes  Greater than 50% was spent in counseling and coordination of care with the patient  Disposition:   F/U  12 months   Orders Placed This Encounter  Procedures  . CT ANGIO CHEST AORTA W &/OR WO CONTRAST  . Ambulatory referral to Cardiothoracic Surgery  . EKG 12-Lead     Signed, Esmond Plants, M.D., Ph.D. 12/16/2016  [place], [place]

## 2016-12-16 ENCOUNTER — Encounter: Payer: Self-pay | Admitting: Cardiovascular Disease

## 2016-12-16 ENCOUNTER — Ambulatory Visit (INDEPENDENT_AMBULATORY_CARE_PROVIDER_SITE_OTHER): Payer: BLUE CROSS/BLUE SHIELD | Admitting: Cardiovascular Disease

## 2016-12-16 VITALS — BP 124/80 | HR 60 | Ht 64.0 in | Wt 183.2 lb

## 2016-12-16 DIAGNOSIS — I951 Orthostatic hypotension: Secondary | ICD-10-CM | POA: Diagnosis not present

## 2016-12-16 DIAGNOSIS — Z950 Presence of cardiac pacemaker: Secondary | ICD-10-CM | POA: Diagnosis not present

## 2016-12-16 DIAGNOSIS — R0789 Other chest pain: Secondary | ICD-10-CM | POA: Diagnosis not present

## 2016-12-16 DIAGNOSIS — I712 Thoracic aortic aneurysm, without rupture: Secondary | ICD-10-CM | POA: Diagnosis not present

## 2016-12-16 DIAGNOSIS — R002 Palpitations: Secondary | ICD-10-CM

## 2016-12-16 DIAGNOSIS — R0602 Shortness of breath: Secondary | ICD-10-CM | POA: Diagnosis not present

## 2016-12-16 DIAGNOSIS — I7121 Aneurysm of the ascending aorta, without rupture: Secondary | ICD-10-CM

## 2016-12-16 MED ORDER — NITROGLYCERIN 0.4 MG SL SUBL: 0.4000 mg | SUBLINGUAL_TABLET | SUBLINGUAL | 3 refills | Status: DC | PRN

## 2016-12-16 NOTE — Patient Instructions (Addendum)
Medication Instructions:   No changes  Labwork:  No new labs  Testing/Procedures: Non-Cardiac CT Angiography (CTA), is a special type of CT scan that uses a computer to produce multi-dimensional views of major blood vessels throughout the body. In CT angiography, a contrast material is injected through an IV to help visualize the blood vessels  Please arrive at [facility] Entrance and check in at the first desk on the right.  ARRIVE AT 12:45PM to register Scheduled for December 28, 2016 at 1:00 PM  LIQUIDS ONLY for 4 hours prior  If you need to reschedule call [phone]   Follow-Up: Your physician wants you to follow-up in: 6 months with Dr. Rockey Situ. You will receive a reminder letter in the mail two months in advance. If you don't receive a letter, please call our office to schedule the follow-up appointment.  You have been referred to Dr. Roxan Hockey and their office should call you to schedule appointment. Their number is [phone].     CT Angiogram A CT angiogram is a procedure to look at the blood vessels in various areas of the body. For this procedure, a large X-ray machine, called a CT scanner, takes detailed pictures of blood vessels that have been injected with a dye (contrast material). A CT angiogram allows your health care provider to see how well blood is flowing to the area of your body that is being checked. Your health care provider will be able to see if there are any problems, such as a blockage. Tell a health care provider about:  Any allergies you have.  All medicines you are taking, including vitamins, herbs, eye drops, creams, and over-the-counter medicines.  Any problems you or family members have had with anesthetic medicines.  Any blood disorders you have.  Any surgeries you have had.  Any medical conditions you have.  Whether you are pregnant or may be pregnant.  Whether you are breastfeeding.  Any anxiety disorders, chronic pain, or  other conditions you have that may increase your stress or prevent you from lying still. What are the risks? Generally, this is a safe procedure. However, problems may occur, including:  Infection.  Bleeding.  Allergic reactions to medicines or dyes.  Damage to other structures or organs.  Kidney damage from the dye or contrast that is used.  Increased risk of cancer from radiation exposure. This risk is low. Talk with your health care provider about: ? The risks and benefits of testing. ? How you can receive the lowest dose of radiation.  What happens before the procedure?  Wear comfortable clothing and remove any jewelry.  Follow instructions from your health care provider about eating and drinking. For most people, instructions may include these actions: ? For 12 hours before the test, avoid caffeine. This includes tea, coffee, soda, and energy drinks or pills. ? For 3-4 hours before the test, stop eating or drinking anything but water. ? Stay well hydrated by continuing to drink water before the exam. This will help to clear the contrast dye from your body after the test.  Ask your health care provider about changing or stopping your regular medicines. This is especially important if you are taking diabetes medicines or blood thinners. What happens during the procedure?  An IV tube will be inserted into one of your veins.  You will be asked to lie on an exam table. This table will slide in and out of the CT machine during the procedure.  Contrast dye will be  injected into the IV tube. You might feel warm, or you may get a metallic taste in your mouth.  The table that you are lying on will move into the CT machine tunnel for the scan.  The person running the machine will give you instructions while the scans are being done. You may be asked to: ? Keep your arms above your head. ? Hold your breath. ? Stay very still, even if the table is moving.  When the scanning is  complete, you will be moved out of the machine.  The IV tube will be removed. The procedure may vary among health care providers and hospitals. What happens after the procedure?  You might feel warm, or you may get a metallic taste in your mouth.  You may be asked to drink water or other fluids to wash (flush) the contrast material out of your body.  It is up to you to get the results of your procedure. Ask your health care provider, or the department that is doing the procedure, when your results will be ready. Summary  A CT angiogram is a procedure to look at the blood vessels in various areas of the body.  You will need to stay very still during the exam.  You may be asked to drink water or other fluids to wash (flush) the contrast material out of your body after your scan. This information is not intended to replace advice given to you by your health care provider. Make sure you discuss any questions you have with your health care provider. Document Released: 02/26/2016 Document Revised: 02/26/2016 Document Reviewed: 02/26/2016 Elsevier Interactive Patient Education  [employer].

## 2016-12-21 ENCOUNTER — Encounter: Payer: Self-pay | Admitting: Cardiovascular Disease

## 2016-12-22 ENCOUNTER — Ambulatory Visit: Payer: Self-pay | Admitting: Cardiovascular Disease

## 2016-12-28 ENCOUNTER — Ambulatory Visit (INDEPENDENT_AMBULATORY_CARE_PROVIDER_SITE_OTHER): Payer: BLUE CROSS/BLUE SHIELD | Admitting: *Deleted

## 2016-12-28 ENCOUNTER — Ambulatory Visit
Admission: RE | Admit: 2016-12-28 | Discharge: 2016-12-28 | Disposition: A | Discharge status: Home / Self Care | Payer: BLUE CROSS/BLUE SHIELD | Source: Ambulatory Visit | Attending: Cardiovascular Disease | Admitting: Cardiovascular Disease

## 2016-12-28 DIAGNOSIS — R918 Other nonspecific abnormal finding of lung field: Secondary | ICD-10-CM | POA: Diagnosis not present

## 2016-12-28 DIAGNOSIS — I7121 Aneurysm of the ascending aorta, without rupture: Secondary | ICD-10-CM

## 2016-12-28 DIAGNOSIS — I712 Thoracic aortic aneurysm, without rupture: Secondary | ICD-10-CM | POA: Insufficient documentation

## 2016-12-28 DIAGNOSIS — I713 Abdominal aortic aneurysm, ruptured: Secondary | ICD-10-CM | POA: Insufficient documentation

## 2016-12-28 DIAGNOSIS — I441 Atrioventricular block, second degree: Secondary | ICD-10-CM | POA: Diagnosis not present

## 2016-12-28 LAB — POCT I-STAT CREATININE: Creatinine, Ser: 0.7 mg/dL (ref 0.44–1.00)

## 2016-12-28 MED ORDER — IOPAMIDOL (ISOVUE-370) INJECTION 76%
75.0000 mL | Freq: Once | INTRAVENOUS | Status: AC | PRN
  Administered 2016-12-28: 75 mL via INTRAVENOUS

## 2016-12-28 NOTE — Progress Notes (Signed)
Remote pacemaker transmission.   

## 2016-12-29 ENCOUNTER — Ambulatory Visit (INDEPENDENT_AMBULATORY_CARE_PROVIDER_SITE_OTHER): Payer: BLUE CROSS/BLUE SHIELD | Admitting: Obstetrics and Gynecology

## 2016-12-29 ENCOUNTER — Encounter: Payer: Self-pay | Admitting: Obstetrics and Gynecology

## 2016-12-29 VITALS — BP 111/76 | HR 76 | Ht 64.0 in | Wt 184.6 lb

## 2016-12-29 DIAGNOSIS — T83711S Erosion of implanted vaginal mesh and other prosthetic materials to surrounding organ or tissue, sequela: Secondary | ICD-10-CM

## 2016-12-29 DIAGNOSIS — N8111 Cystocele, midline: Secondary | ICD-10-CM | POA: Diagnosis not present

## 2016-12-29 DIAGNOSIS — N816 Rectocele: Secondary | ICD-10-CM | POA: Diagnosis not present

## 2016-12-29 DIAGNOSIS — R102 Pelvic and perineal pain: Secondary | ICD-10-CM | POA: Diagnosis not present

## 2016-12-29 DIAGNOSIS — Z90710 Acquired absence of both cervix and uterus: Secondary | ICD-10-CM

## 2016-12-29 LAB — POCT URINALYSIS DIPSTICK
Bilirubin, UA: NEGATIVE
Blood, UA: NEGATIVE
Glucose, UA: NEGATIVE
KETONES UA: NEGATIVE
Leukocytes, UA: NEGATIVE
Nitrite, UA: NEGATIVE
PROTEIN UA: NEGATIVE
Urobilinogen, UA: 0.2 E.U./dL
pH, UA: 6 (ref 5.0–8.0)

## 2016-12-29 LAB — CUP PACEART REMOTE DEVICE CHECK
Battery Impedance: 139 Ohm
Battery Remaining Longevity: 150 mo
Battery Voltage: 2.79 V
Brady Statistic AP VS Percent: 7 %
Brady Statistic AS VS Percent: 92 %
Date Time Interrogation Session: 20180619082557
Implantable Lead Implant Date: 20061128
Implantable Lead Location: 753859
Implantable Pulse Generator Implant Date: 20161123
Lead Channel Pacing Threshold Pulse Width: 0.4 ms
Lead Channel Pacing Threshold Pulse Width: 0.4 ms
Lead Channel Setting Pacing Amplitude: 2.5 V
Lead Channel Setting Pacing Pulse Width: 0.4 ms
Lead Channel Setting Sensing Sensitivity: 2.8 mV
MDC IDC LEAD IMPLANT DT: 20061128
MDC IDC LEAD LOCATION: 753860
MDC IDC MSMT LEADCHNL RA IMPEDANCE VALUE: 531 Ohm
MDC IDC MSMT LEADCHNL RA PACING THRESHOLD AMPLITUDE: 0.375 V
MDC IDC MSMT LEADCHNL RV IMPEDANCE VALUE: 722 Ohm
MDC IDC MSMT LEADCHNL RV PACING THRESHOLD AMPLITUDE: 0.875 V
MDC IDC SET LEADCHNL RA PACING AMPLITUDE: 2 V
MDC IDC STAT BRADY AP VP PERCENT: 0 %
MDC IDC STAT BRADY AS VP PERCENT: 0 %

## 2016-12-29 NOTE — Progress Notes (Signed)
GYN ENCOUNTER NOTE  Subjective:       Wendy Kelly is a 61 y.o., [phone] with a history of LAVH w/ salpingo-oophorectomy is here for gynecologic evaluation of the following issues:  1. Bladder Drop sensation  2. Severe pain 3. Lower back discomfort   During walking or activites, reports "bladder drop feeling" simiar to the past before her mesh surgery. She also uses suppositories for dryness which did cause significant burning at the time of making the appointment, this has since subsided. Endorses pain and pressure with "swelling" in her lower abdomin which has partially resolved. Endorses more frequent, slower and longer urination. Some leaking which required micro pads but also has subsided. Low back pain (from gardening).   Gynecologic History LAVH w/ salpingo-oophorectomy, April 2013 Last mammogram: 05/21/2016. Results were: normal   Obstetric History OB History  Gravida Para Term Preterm AB Living  4 2 2   2 2   SAB TAB Ectopic Multiple Live Births  1 1     2     # Outcome Date GA Lbr Len/2nd Weight Sex Delivery Anes PTL Lv  4 SAB 1985          3 Term 1985   8 lb 14.4 oz (4.037 kg) F Vag-Spont   LIV  2 Term 1979   7 lb 2.2 oz (3.239 kg) M Vag-Spont   LIV  1 TAB 1976              Past Medical History:  Diagnosis Date  . AA (aortic aneurysm) ([place])    a. 04/2011 Echo: Ao Root: 4.1cm, Asc Ao 4.7cm.  . Anxiety   . AV block, 2nd degree    a. 05/2005 - s/p MDT Adapta ADDR01 Dual Chamber PPM  . Bicuspid aortic valve   . Chest pain    a. 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  . Depression   . Expressive aphasia    a. ongoing since 04/2011 - seen by neurology - ? TIA vs. Migraine  . Facial numbness    a. ongoing since 04/2011 - seen by neurology - ? TIA vs. Migraine  . GERD (gastroesophageal reflux disease)   . Low blood pressure   . Presence of permanent cardiac pacemaker   . Syncope    a. 04/2011 Echo: EF 55-65%, No RWMA, Gr 1 DD.    Past Surgical History:  Procedure Laterality  Date  . ABDOMINAL WALL MESH  REMOVAL    . bladder tack    . cataract  2004  . CHOLECYSTECTOMY  2000  . DILATION AND CURETTAGE OF UTERUS    . EP IMPLANTABLE DEVICE N/A 06/04/2015   Procedure: PPM Generator Changeout;  Surgeon: Deboraha Sprang, MD;  Location: [facility];  Service: Cardiovascular;  Laterality: N/A;  . ESOPHAGOGASTRODUODENOSCOPY (EGD) WITH PROPOFOL N/A 12/18/2015   Procedure: ESOPHAGOGASTRODUODENOSCOPY (EGD) WITH gastric biopsy and dilation;  Surgeon: Lucilla Lame, MD;  Location: [facility];  Service: Endoscopy;  Laterality: N/A;  . PACEMAKER INSERTION  2006   Medtronic Adapta ADDR01  . TONSILLECTOMY    . VAGINAL HYSTERECTOMY  2013   mad/cope    Current Outpatient Prescriptions on File Prior to Visit  Medication Sig Dispense Refill  . aspirin 81 MG tablet Take 81 mg by mouth daily.    Marland Kitchen b complex vitamins tablet Take 1 tablet by mouth daily.    . Calcium 75 MG TABS Take 1 tablet by mouth daily.     Marland Kitchen DHEA 10 MG TABS Take 10 mg  by mouth every morning. 90 tablet 3  . nitroGLYCERIN (NITROSTAT) 0.4 MG SL tablet Place 1 tablet (0.4 mg total) under the tongue every 5 (five) minutes as needed for chest pain. 25 tablet 3  . NON FORMULARY Vaginal tablet (estrogen and testosterone) 2-3 times weekly.    Marland Kitchen PRESCRIPTION MEDICATION Place 1 tablet vaginally 2 (two) times a week. Tuesday and Saturdays E3 1mg  and Testosterone 1mg  compound    . sodium chloride (MURO 128) 5 % ophthalmic solution Place 1 drop into both eyes daily.    Marland Kitchen ibuprofen (ADVIL,MOTRIN) 200 MG tablet Takes 2-3 tablets as needed.     No current facility-administered medications on file prior to visit.    Review of Systems Review of Systems  Constitutional: Negative for chills, fever and weight loss.  Genitourinary: Positive for frequency (with slower than normal flow; resolved ). Negative for dysuria, flank pain and hematuria.  All other systems reviewed and are negative.   Objective:   BP 111/76    Pulse 76   Ht 5\' 4"  (1.626 m)   Wt 184 lb 9.6 oz (83.7 kg)   BMI 31.69 kg/m  Blood pressure 111/76, pulse 76, height 5\' 4"  (1.626 m), weight 184 lb 9.6 oz (83.7 kg). CONSTITUTIONAL: Well-developed, well-nourished female in no acute distress.  Physical Exam  Constitutional: She is well-developed, well-nourished, and in no distress.  Abdominal: Soft. Normal appearance and bowel sounds are normal.  Genitourinary:  Genitourinary Comments: Denies pain on percussion of kidneys  PELVIC:  External Genitalia: Normal  Vagina: Grade 1 Cystocele and mild rectocele on valsalva. Palpable residuale mesh, 3/4 tender, anterior vaginal wall close to cuff  Cervix: Absent  Uterus: Absent  Adnexa: Absent  Bladder: Nontender   Assessment:  1. Pelvic pain 2. Status post laparoscopic assisted vaginal hysterectomy (LAVH) 3. Erosion of implanted vaginal mesh to surrounding organ or tissue, sequela 4. Cystocele, midline, First-degree 5. Rectocele, Asymptomatic  Plan:  1. Pelvic pain - Urine Culture - POCT urinalysis dipstick 2. Status post laparoscopic assisted vaginal hysterectomy (LAVH)BSO 3. Erosion of implanted vaginal mesh to surrounding organ or tissue, sequela - Continue use of combination topical Estrogen/Testosterone 3x week 4. Cystocele, midline 5. Rectocele; Use stool softener for difficult bowls and informed her to return if bowl movement become difficult.  6. Follow-up as needed or if symptoms worsen  A total of 15 minutes were spent face-to-face with the patient during this encounter and over half of that time dealt with counseling and coordination of care.   Carlene Coria, PA-S Brayton Mars, MD    I have seen, interviewed, and examined the patient in conjunction with the [facility].A. student and affirm the diagnosis and management plan. Martin A. DeFrancesco, MD, FACOG   Note: This dictation was prepared with Dragon dictation along with smaller phrase technology.  Any transcriptional errors that result from this process are unintentional.

## 2016-12-29 NOTE — Patient Instructions (Signed)
1. Continue with hydration with water and cranberry tablets 2. Urinalysis and urine culture is sent to rule out bladder infection 3. Follow-up as needed if symptoms recur.

## 2016-12-31 ENCOUNTER — Encounter: Payer: Self-pay | Admitting: Cardiology

## 2016-12-31 LAB — URINE CULTURE: ORGANISM ID, BACTERIA: NO GROWTH

## 2017-01-06 ENCOUNTER — Institutional Professional Consult (permissible substitution) (INDEPENDENT_AMBULATORY_CARE_PROVIDER_SITE_OTHER): Payer: BLUE CROSS/BLUE SHIELD | Admitting: Cardiothoracic Surgery

## 2017-01-06 ENCOUNTER — Encounter: Payer: Self-pay | Admitting: Cardiothoracic Surgery

## 2017-01-06 VITALS — BP 121/76 | HR 88 | Resp 16 | Ht 64.0 in | Wt 182.0 lb

## 2017-01-06 DIAGNOSIS — I712 Thoracic aortic aneurysm, without rupture, unspecified: Secondary | ICD-10-CM

## 2017-01-06 NOTE — Progress Notes (Signed)
PCP is Valerie Roys, DO Referring Provider is Minna Merritts, MD  Chief Complaint  Patient presents with  . Thoracic Aortic Aneurysm    Surgical eval, CTA Chest 12/28/16...ECHO 07/13/16  Patient examined, CT scan of chest and echocardiogram images personally reviewed and counseled with patient  HPI: 61 year old Caucasian female nonsmoker presents for evaluation of a asymptomatic 4.7 cm fusiform ascending aneurysm. This is been followed by her cardiologist, Dr. Rockey Situ , and and 2016 was 4.5 cm. Her echocardiogram shows normal LV function with a moderately dilated aorta, trace aortic insufficiency, indeterminant aortic valve leaflet configuration but it looks tricuspid. The patient has no history of hypertension. There is no family history of thoracic or abdominal aortic aneurysm rupture or dissection. Her adult son has a 4.6 cm ascending aneurysm. The patient has never had a aortic murmur. No history of rheumatic heart disease as a child. The patient has had stress test before which were negative for ischemia.  Past Medical History:  Diagnosis Date  . AA (aortic aneurysm) ([place])    a. 04/2011 Echo: Ao Root: 4.1cm, Asc Ao 4.7cm.  . Anxiety   . AV block, 2nd degree    a. 05/2005 - s/p MDT Adapta ADDR01 Dual Chamber PPM  . Bicuspid aortic valve   . Chest pain    a. 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  . Depression   . Expressive aphasia    a. ongoing since 04/2011 - seen by neurology - ? TIA vs. Migraine  . Facial numbness    a. ongoing since 04/2011 - seen by neurology - ? TIA vs. Migraine  . GERD (gastroesophageal reflux disease)   . Low blood pressure   . Presence of permanent cardiac pacemaker   . Syncope    a. 04/2011 Echo: EF 55-65%, No RWMA, Gr 1 DD.    Past Surgical History:  Procedure Laterality Date  . ABDOMINAL WALL MESH  REMOVAL    . bladder tack    . cataract  2004  . CHOLECYSTECTOMY  2000  . DILATION AND CURETTAGE OF UTERUS    . EP IMPLANTABLE DEVICE N/A 06/04/2015    Procedure: PPM Generator Changeout;  Surgeon: Deboraha Sprang, MD;  Location: [facility];  Service: Cardiovascular;  Laterality: N/A;  . ESOPHAGOGASTRODUODENOSCOPY (EGD) WITH PROPOFOL N/A 12/18/2015   Procedure: ESOPHAGOGASTRODUODENOSCOPY (EGD) WITH gastric biopsy and dilation;  Surgeon: Lucilla Lame, MD;  Location: [facility];  Service: Endoscopy;  Laterality: N/A;  . PACEMAKER INSERTION  2006   Medtronic Adapta ADDR01  . TONSILLECTOMY    . VAGINAL HYSTERECTOMY  2013   mad/cope    Family History  Problem Relation Age of Onset  . COPD Mother        alive @ 29  . Stroke Father        died @ 33  . Breast cancer Maternal Aunt 70  . COPD Maternal Grandmother   . Ovarian cancer Neg Hx   . Diabetes Neg Hx     Social History Social History  Substance Use Topics  . Smoking status: Never Smoker  . Smokeless tobacco: Never Used     Comment: tobacco use -no  . Alcohol use No    Current Outpatient Prescriptions  Medication Sig Dispense Refill  . aspirin 81 MG tablet Take 81 mg by mouth daily.    Marland Kitchen b complex vitamins tablet Take 1 tablet by mouth daily.    . Calcium 75 MG TABS Take 1 tablet by mouth daily.     Marland Kitchen  CRANBERRY SOFT PO Take 1 each by mouth daily.    Marland Kitchen DHEA 10 MG TABS Take 10 mg by mouth every morning. 90 tablet 3  . fluticasone (FLONASE SENSIMIST) 27.5 MCG/SPRAY nasal spray     . ibuprofen (ADVIL,MOTRIN) 200 MG tablet Takes 2-3 tablets as needed.    . nitroGLYCERIN (NITROSTAT) 0.4 MG SL tablet Place 1 tablet (0.4 mg total) under the tongue every 5 (five) minutes as needed for chest pain. 25 tablet 3  . NON FORMULARY Vaginal tablet (estrogen and testosterone) 2-3 times weekly.    Marland Kitchen PRESCRIPTION MEDICATION Place 1 tablet vaginally 2 (two) times a week. Tuesday and Saturdays E3 1mg  and Testosterone 1mg  compound    . sodium chloride (MURO 128) 5 % ophthalmic solution Place 1 drop into both eyes daily.     No current facility-administered medications for this  visit.     Allergies  Allergen Reactions  . Cephalosporins Swelling  . Phenylephrine-Guaifenesin Anaphylaxis    Night terrors  . Tape Rash  . Cefdinir Swelling       . Codeine Nausea And Vomiting  . Dexilant [Dexlansoprazole]     Pressure in head  . Entex Lq [Phenylephrine-Guaifenesin]     Night terrors  . Flagyl [Metronidazole] Swelling  . Guaifenesin & Derivatives Other (See Comments)    Night terrors  . Lactose Intolerance (Gi) Other (See Comments)    GI upset  . Naproxen Sodium     Itching/rash  . Other Other (See Comments)    Flu vaccine:  Weakness, dizziness, tia type symptoms.    . Oxycodone Nausea And Vomiting    And itching  . Penicillins     Rash/hives  . Succinylcholine Other (See Comments)    Trouble waking up  . Sulfonamide Derivatives     Rash/itching  . Tetracyclines & Related Other (See Comments)    Unkown  . Wheat Bran   . Lactobacillus Itching  . Pseudoephedrine Rash  . Sulfa Antibiotics Rash    Review of Systems         Review of Systems :  [ y ] = yes, [  ] = no        General :  Weight gain [   ]    Weight loss  [   ]  Fatigue [  ]  Fever [  ]  Chills  [  ]                                Weakness  [  ]           HEENT    Headache [place].Blacker  ]  Dizziness [  ]  Blurred vision [  ] Glaucoma  [  ]                          Nosebleeds [  ] Painful or loose teeth [  ]        Cardiac :  Chest pain/ pressure [atypical chest pain  ]  Resting SOB [  ] exertional SOB [  ]                        Orthopnea [  ]  Pedal edema  [  ]  Palpitations [  ] Syncope/presyncope [ ]   Paroxysmal nocturnal dyspnea [  ]         Pulmonary : cough [  ]  wheezing [  ]  Hemoptysis [  ] Sputum [  ] Snoring [  ]                              Pneumothorax [  ]  Sleep apnea [  ]        GI : Vomiting [  ]  Dysphagia [  ]  Melena  [  ]  Abdominal pain [  ] BRBPR [  ]              Heart burn [  ]  Constipation [  ] Diarrhea  [  ] Colonoscopy [   ]        GU  : Hematuria [  ]  Dysuria [  ]  Nocturia [  ] UTI's [  ]        Vascular : Claudication [  ]  Rest pain [  ]  DVT [  ] Vein stripping [  ] leg ulcers [  ]                          TIA [  ] Stroke [  ]  Varicose veins [  ]        NEURO :  Headaches  [place].Blacker  ] Seizures [  ] Vision changes [  ] Paresthesias [  ]                                       Seizures [  ]        Musculoskeletal :  Arthritis [  ] Gout  [  ]  Back pain [  ]  Joint pain [  ]        Skin :  Rash [  ]  Melanoma [  ] Sores [  ]        Heme : Bleeding problems [  ]Clotting Disorders [  ] Anemia [  ]Blood Transfusion [ ]         Endocrine : Diabetes [  ] Heat or Cold intolerance [  ] Polyuria [  ]excessive thirst [ ]         Psych : Depression [  ]  Anxiety [  ]  Psych hospitalizations [  ] Memory change [  ]      No history of thoracic trauma or pneumothorax      Works out regularly at a fitness center using low weight training                                         BP 121/76 (BP Location: Right Arm, Patient Position: Sitting, Cuff Size: Large)   Pulse 88   Resp 16   Ht 5\' 4"  (1.626 m)   Wt 182 lb (82.6 kg)   SpO2 99% Comment: RA  BMI 31.24 kg/m  Physical Exam      Physical Exam  General: Very pleasant overweight middle-aged Caucasian female no acute distress HEENT: Normocephalic pupils equal , dentition adequate Neck: Supple without JVD, adenopathy, or bruit Chest: Clear to  auscultation, symmetrical breath sounds, no rhonchi, no tenderness             or deformity Cardiovascular: Regular rate and rhythm, no murmur, no gallop, peripheral pulses             palpable in all extremities Abdomen:  Soft, nontender, no palpable mass or organomegaly Extremities: Warm, well-perfused, no clubbing cyanosis edema or tenderness,              no venous stasis changes of the legs Rectal/GU: Deferred Neuro: Grossly non--focal and symmetrical throughout Skin: Clean and dry without rash or ulceration   Diagnostic  Tests: CT scan shows a fusiform aneurysm, no evidence of neural thickening hematoma or ulceration. Diameter 4.7 cm. Arch and descending thoracic aneurysm normal size  Echocardiogram shows normal LV function, trace aortic insufficiency, probable tricuspid aortic valve leaflet configuration  Impression: Moderate fusiform asymptomatic ascending aneurysm Patient does not smoke or have hypertension Surveillance annual CT scans are the best therapy at this time. Surgery not indicated unless diameter approaches 5.5 cm.  Plan: Return in one year with CTA of the thoracic aorta           Patient knows importance of keeping her blood pressure checked and for early therapy if systolic seeds 224 consistently. She should not lift more than 50 pounds.   Len Childs, MD [facility] [phone]

## 2017-01-28 ENCOUNTER — Encounter: Payer: Self-pay | Admitting: Cardiovascular Disease

## 2017-01-28 ENCOUNTER — Ambulatory Visit (INDEPENDENT_AMBULATORY_CARE_PROVIDER_SITE_OTHER): Payer: BLUE CROSS/BLUE SHIELD | Admitting: Cardiovascular Disease

## 2017-01-28 ENCOUNTER — Telehealth: Payer: Self-pay | Admitting: Cardiovascular Disease

## 2017-01-28 VITALS — BP 130/64 | HR 74 | Ht 64.0 in | Wt 184.5 lb

## 2017-01-28 DIAGNOSIS — R55 Syncope and collapse: Secondary | ICD-10-CM

## 2017-01-28 DIAGNOSIS — I712 Thoracic aortic aneurysm, without rupture: Secondary | ICD-10-CM | POA: Diagnosis not present

## 2017-01-28 DIAGNOSIS — Z950 Presence of cardiac pacemaker: Secondary | ICD-10-CM

## 2017-01-28 DIAGNOSIS — I951 Orthostatic hypotension: Secondary | ICD-10-CM

## 2017-01-28 DIAGNOSIS — I441 Atrioventricular block, second degree: Secondary | ICD-10-CM | POA: Diagnosis not present

## 2017-01-28 DIAGNOSIS — I7121 Aneurysm of the ascending aorta, without rupture: Secondary | ICD-10-CM

## 2017-01-28 NOTE — Telephone Encounter (Signed)
Patient is here in the office to see Dr. Rockey Situ at this time.

## 2017-01-28 NOTE — Telephone Encounter (Signed)
Patient wants to be seen asap as she is having some interim weird feelings with heart not certain it is related to pacemaker.  Please call to discuss added on to see Gollan at 2 today

## 2017-01-28 NOTE — Telephone Encounter (Signed)
Please advise. Patient already has an appointment at 2 with gollan

## 2017-01-28 NOTE — Patient Instructions (Signed)

## 2017-01-28 NOTE — Progress Notes (Signed)
Patient ID: Ellysia Char, female   DOB: 1955/09/13, 61 y.o.   MRN: [id number] Cardiology Office Note  Date:  01/28/2017   ID:  Donnis Pecha, DOB 09/19/1955, MRN [id number]  PCP:  Valerie Roys, DO   Chief Complaint  Patient presents with  . other    having some wierd feelings in heart that stated June 24th 2018. Patient c/o chest pressure/flutter and abnormal breathing. Meds reviewed verbally with patient.     HPI:  Ms. Henigan is a very pleasant 61 year old woman with a history of a  ascending aorta aneurysm estimated at 4.6 to 4.8 cm that has been monitored with echocardiography and CT scans, No significant change since 2010 suspected bicuspid aortic valve on previous echocardiograms (no TEE performed) syncope second-degree AV block and pacemaker placement in November 2006,  episode of TIA-type symptoms with right facial droop, expressive aphasia that resolved,  intermittent chest pain, atypical in nature occasional nausea, anxiety.  Chronic orthostasis , which she prefers to manage with fluids, previously Declined medications She presents for routine followup of her chest pain and ascending aorta dilation  On her visit today she reports having strong heartbeat More notable recently, sometimes waking her up in the middle of the night Rhythm is not fast, only strong, regular not irregular Etiology unclear, worried there was a problem with her pacemaker  Long discussion concerning previous meeting with CT surgery in [place] to have repeat CT scan done next year  Still exercising on a regular basis, monitoring blood pressure  Thorough review with her today of her prior pacemaker downloads A paced, with some V pace 5-7% of the time  EKG on today's visit shows normal sinus rhythm with rate 74 bpm, no significant ST or T-wave changes  Other past medical history reviewed Ascending thoracic aortic aneurysm now measuring 48 mm compared to 46 mm in 2015.    Echocardiogram January 2018 Aortic root: The aortic root was dilated at 4.2cm. - Ascending aorta: The ascending aorta was dilated at 4.6cm. No significant change  Last CT chest December 2015, aorta 4.6 cm at that time No change compared to CT scan August 2011, at that time 4.6 x 4.8 cm  Son recently diagnosed with dilated ascending aorta 4.2 cm, he is out of state  She has some atypical chest pain, improved symptoms with accupuncture and chiropractic Some issues with swallowing  CT scan neck reviewed images pulled up in the office today no significant carotid disease, aortic arch disease/calcification  She was seen in December 2015 in the hospital for chest pain. Echocardiogram was unchanged, chest pain felt to be noncardiac. Echocardiogram May and December 2015, relatively no change.   CT scan in November 2013. 4.7 cm ascending aorta at that time  She has had low potassium in the past on visits to the emergency room for chest pain  Last stress test was April 2013 performed for chest pain. Equivocal study, no large regions of ischemia, some attenuation artifact noted.  episode of tachycardia followed by chest pain 05/18/2012. Workup in the hospital was essentially negative. She had workup including carotid ultrasound that showed no significant disease.   Previous lab work shows total cholesterol 151, LDL 84, HDL 55, TSH 1.8  PMH:   has a past medical history of AA (aortic aneurysm) ([place]); Anxiety; AV block, 2nd degree; Bicuspid aortic valve; Chest pain; Depression; Expressive aphasia; Facial numbness; GERD (gastroesophageal reflux disease); Low blood pressure; Presence of permanent cardiac pacemaker; and Syncope.  PSH:  Past Surgical History:  Procedure Laterality Date  . ABDOMINAL WALL MESH  REMOVAL    . bladder tack    . cataract  2004  . CHOLECYSTECTOMY  2000  . DILATION AND CURETTAGE OF UTERUS    . EP IMPLANTABLE DEVICE N/A 06/04/2015   Procedure: PPM Generator  Changeout;  Surgeon: Deboraha Sprang, MD;  Location: [facility];  Service: Cardiovascular;  Laterality: N/A;  . ESOPHAGOGASTRODUODENOSCOPY (EGD) WITH PROPOFOL N/A 12/18/2015   Procedure: ESOPHAGOGASTRODUODENOSCOPY (EGD) WITH gastric biopsy and dilation;  Surgeon: Lucilla Lame, MD;  Location: [facility];  Service: Endoscopy;  Laterality: N/A;  . PACEMAKER INSERTION  2006   Medtronic Adapta ADDR01  . TONSILLECTOMY    . VAGINAL HYSTERECTOMY  2013   mad/cope    Current Outpatient Prescriptions  Medication Sig Dispense Refill  . aspirin 81 MG tablet Take 81 mg by mouth daily.    Marland Kitchen b complex vitamins tablet Take 1 tablet by mouth daily.    . Calcium 75 MG TABS Take 1 tablet by mouth daily.     Marland Kitchen CRANBERRY SOFT PO Take 1 each by mouth daily.    Marland Kitchen DHEA 10 MG TABS Take 10 mg by mouth every morning. 90 tablet 3  . fluticasone (FLONASE SENSIMIST) 27.5 MCG/SPRAY nasal spray     . ibuprofen (ADVIL,MOTRIN) 200 MG tablet Takes 2-3 tablets as needed.    . nitroGLYCERIN (NITROSTAT) 0.4 MG SL tablet Place 1 tablet (0.4 mg total) under the tongue every 5 (five) minutes as needed for chest pain. 25 tablet 3  . NON FORMULARY Vaginal tablet (estrogen and testosterone) 2-3 times weekly.    Marland Kitchen PRESCRIPTION MEDICATION Place 1 tablet vaginally 2 (two) times a week. Tuesday and Saturdays E3 1mg  and Testosterone 1mg  compound    . sodium chloride (MURO 128) 5 % ophthalmic solution Place 1 drop into both eyes daily.     No current facility-administered medications for this visit.      Allergies:   Cephalosporins; Phenylephrine-guaifenesin; Tape; Cefdinir; Codeine; Dexilant [dexlansoprazole]; Entex lq [phenylephrine-guaifenesin]; Flagyl [metronidazole]; Guaifenesin & derivatives; Lactose intolerance (gi); Naproxen sodium; Other; Oxycodone; Penicillins; Succinylcholine; Sulfonamide derivatives; Tetracyclines & related; Wheat bran; Lactobacillus; Pseudoephedrine; and Sulfa antibiotics   Social History:   The patient  reports that she has never smoked. She has never used smokeless tobacco. She reports that she does not drink alcohol or use drugs.   Family History:   family history includes Breast cancer (age of onset: 29) in her maternal aunt; COPD in her maternal grandmother and mother; Stroke in her father.    Review of Systems: Review of Systems  Constitutional: Negative.   Respiratory: Negative.   Cardiovascular: Positive for palpitations.  Gastrointestinal: Negative.   Musculoskeletal: Negative.   Neurological: Negative.   Psychiatric/Behavioral: Negative.   All other systems reviewed and are negative.    PHYSICAL EXAM: VS:  BP 130/64 (BP Location: Left Arm, Patient Position: Sitting, Cuff Size: Normal)   Pulse 74   Ht 5\' 4"  (1.626 m)   Wt 184 lb 8 oz (83.7 kg)   BMI 31.67 kg/m  , BMI Body mass index is 31.67 kg/m. GEN: Well nourished, well developed, in no acute distress, obese  HEENT: normal  Neck: no JVD, carotid bruits, or masses Cardiac: RRR; 1+ SEM RSB,  no rubs, or gallops,no edema  Respiratory:  clear to auscultation bilaterally, normal work of breathing GI: soft, nontender, nondistended, + BS MS: no deformity or atrophy  Skin: warm and dry,  no rash Neuro:  Strength and sensation are intact Psych: euthymic mood, full affect    Recent Labs: 04/14/2016: ALT 32; BUN 14; Potassium 4.1; Sodium 143; TSH 1.720 11/15/2016: Hemoglobin 14.0; Platelets 178 12/28/2016: Creatinine, Ser 0.70    Lipid Panel Lab Results  Component Value Date   CHOL 144 04/14/2016   HDL 53 04/14/2016   LDLCALC 78 04/14/2016   TRIG 63 04/14/2016      Wt Readings from Last 3 Encounters:  01/28/17 184 lb 8 oz (83.7 kg)  01/06/17 182 lb (82.6 kg)  12/29/16 184 lb 9.6 oz (83.7 kg)       ASSESSMENT AND PLAN:  Ascending aortic aneurysm (HCC) - Followed in [place] by CT surgery Could perform TEE to evaluate aortic valve once aorta reaches 5 cm (unclear if she has bicuspid or  tricuspid, not well visualized on echo)  Palpitations -  No changes to her medications, pacemaker in place She has previously declined beta blockers secondary to hypotension Strong heartbeat, unclear if she is appreciating paced rhythm We will set up appointment for pacer check if symptoms persist Perhaps we could lower back up rate down to 50. Currently appears to be 80  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onic -  Managed by Dr. Caryl Comes, previous  generator change out  Orthostatic hypotension Rare symptoms, improved with fluid hydration, frequent snacking Does not want medications or abdominal binder She has been reluctant to be on beta blockers or ARB  AV block, 2nd degree History of pacemaker   Total encounter time more than 25 minutes  Greater than 50% was spent in counseling and coordination of care with the patient  Disposition:   F/U  12 months   No orders of the defined types were placed in this encounter.    Signed, Esmond Plants, M.D., Ph.D. 01/28/2017  [place], [place]

## 2017-02-01 ENCOUNTER — Telehealth: Payer: Self-pay | Admitting: *Deleted

## 2017-02-01 NOTE — Telephone Encounter (Signed)
Spoke with patient to cancel appointment this afternoon as she can send a transmission via her home monitor to determine appropriate pacemaker function.  Patient reports that she is already en route.  Offered to still see her in the office as she is en route and this is her preference, but she declines and states she already turned around.  Advised patient to send a transmission this evening and that I will review it and call her back tomorrow.  Explained that I cannot make any programming changes without an order from Dr. Caryl Comes and he is not in the [place] office this afternoon.  Advised that if any abnormalities or changes since 12/28/16 transmission, will plan to schedule her with Dr. Caryl Comes to discuss.  Patient verbalizes agreement with plan.  Regarding symptoms, patient reports feeling "thumps" during the night, is concerned that this is her PPM.  Episodes started on 01/02/17 and are infrequent, only occur at night.  Patient was recently seen by Dr. Rockey Situ on 01/28/17.  Discussed with Dr. Rockey Situ today and he agreed that a remote transmission is appropriate.  If any abnormalities noted, will plan to review with Dr. Caryl Comes for recommendations regarding any programming changes.

## 2017-02-02 NOTE — Telephone Encounter (Signed)
Received transmission.  Reviewed with Dr. Caryl Comes.  No alerts, abnormalities, or changes since 12/28/16 remote transmission.  No new episodes to review, V-pacing percentage stable at ~0.4%.  Per Dr. Caryl Comes, only recommendation at this point is to place an event monitor as PPM transmission does not reveal any abnormalities.  Patient verbalizes understanding but states she previously wore a monitor for 30 days and "they didn't find anything."  She states she will think about this option and call back if she wishes to proceed.  She is appreciative of call and denies additional questions or concerns at this time.

## 2017-02-17 ENCOUNTER — Telehealth: Payer: Self-pay | Admitting: Family Medicine

## 2017-02-17 NOTE — Telephone Encounter (Signed)
Patient notified

## 2017-02-17 NOTE — Telephone Encounter (Signed)
She should try the 3 day OTC monistat- if not better with that, she should come in to get swabbed so we can make sure it's a yeast infection and not BV. Thanks

## 2017-02-17 NOTE — Telephone Encounter (Signed)
Routing to provider  

## 2017-02-17 NOTE — Telephone Encounter (Signed)
Patient thinks she may have a yeast infection and would like to know if Dr Wynetta Emery would prescribe her something for this.  [phone]  [facility] if Dr Wynetta Emery would call something in.  Thanks

## 2017-03-08 DIAGNOSIS — Z961 Presence of intraocular lens: Secondary | ICD-10-CM | POA: Diagnosis not present

## 2017-03-29 ENCOUNTER — Ambulatory Visit (INDEPENDENT_AMBULATORY_CARE_PROVIDER_SITE_OTHER): Payer: BLUE CROSS/BLUE SHIELD | Admitting: *Deleted

## 2017-03-29 DIAGNOSIS — I441 Atrioventricular block, second degree: Secondary | ICD-10-CM

## 2017-03-29 NOTE — Progress Notes (Signed)
Remote pacemaker transmission.   

## 2017-03-30 LAB — CUP PACEART REMOTE DEVICE CHECK
Battery Impedance: 139 Ohm
Battery Remaining Longevity: 150 mo
Brady Statistic AS VS Percent: 90 %
Implantable Lead Implant Date: 20061128
Implantable Lead Location: 753860
Implantable Lead Model: 4092
Lead Channel Impedance Value: 781 Ohm
Lead Channel Setting Pacing Amplitude: 2 V
Lead Channel Setting Pacing Amplitude: 2.5 V
Lead Channel Setting Pacing Pulse Width: 0.4 ms
Lead Channel Setting Sensing Sensitivity: 4 mV
MDC IDC LEAD IMPLANT DT: 20061128
MDC IDC LEAD LOCATION: 753859
MDC IDC MSMT BATTERY VOLTAGE: 2.78 V
MDC IDC MSMT LEADCHNL RA IMPEDANCE VALUE: 582 Ohm
MDC IDC MSMT LEADCHNL RA PACING THRESHOLD AMPLITUDE: 0.375 V
MDC IDC MSMT LEADCHNL RA PACING THRESHOLD PULSEWIDTH: 0.4 ms
MDC IDC MSMT LEADCHNL RV PACING THRESHOLD AMPLITUDE: 1 V
MDC IDC MSMT LEADCHNL RV PACING THRESHOLD PULSEWIDTH: 0.4 ms
MDC IDC PG IMPLANT DT: 20161123
MDC IDC SESS DTM: 20180918101704
MDC IDC STAT BRADY AP VP PERCENT: 0 %
MDC IDC STAT BRADY AP VS PERCENT: 10 %
MDC IDC STAT BRADY AS VP PERCENT: 0 %

## 2017-03-31 ENCOUNTER — Encounter: Payer: Self-pay | Admitting: Cardiology

## 2017-04-05 ENCOUNTER — Telehealth: Payer: Self-pay | Admitting: Internal Medicine

## 2017-04-05 DIAGNOSIS — L570 Actinic keratosis: Secondary | ICD-10-CM | POA: Diagnosis not present

## 2017-04-05 DIAGNOSIS — D485 Neoplasm of uncertain behavior of skin: Secondary | ICD-10-CM | POA: Diagnosis not present

## 2017-04-05 DIAGNOSIS — L309 Dermatitis, unspecified: Secondary | ICD-10-CM | POA: Diagnosis not present

## 2017-04-05 DIAGNOSIS — Z85828 Personal history of other malignant neoplasm of skin: Secondary | ICD-10-CM | POA: Diagnosis not present

## 2017-04-05 DIAGNOSIS — L281 Prurigo nodularis: Secondary | ICD-10-CM | POA: Diagnosis not present

## 2017-04-05 NOTE — Telephone Encounter (Signed)
Pt calling needing to know since she is coming on 06/14/17  She is to see Dr Rockey Situ and see Dr Caryl Comes for a pacer check She is wondering if she is need to do the remote check that is currently scheduled 2 weeks after that  Would like a call back

## 2017-04-05 NOTE — Telephone Encounter (Signed)
Will forward to [facility]. 

## 2017-04-06 NOTE — Telephone Encounter (Signed)
Wendy Kelly calling for an explanation as to why her remote transmission and office pacemaker check are scheduled so close together. I let her know that we are doing that to prevent patients from being lost to follow-up. I made her aware that when she comes in to see Dr. Caryl Comes in December to let us know to reschedule her remote for 91 days out we will do that. She verbalizes understanding.

## 2017-04-06 NOTE — Telephone Encounter (Signed)
LMOM (ok per DPR) that our office now does quarterly remote transmissions regardless of OV to ensure that devices are checked and reviewed quarterly at a minimum. Gave direct # to [facility] to call back with any questions.

## 2017-04-13 ENCOUNTER — Other Ambulatory Visit: Payer: Self-pay | Admitting: Family Medicine

## 2017-04-13 DIAGNOSIS — Z1231 Encounter for screening mammogram for malignant neoplasm of breast: Secondary | ICD-10-CM

## 2017-04-19 ENCOUNTER — Ambulatory Visit (INDEPENDENT_AMBULATORY_CARE_PROVIDER_SITE_OTHER): Payer: BLUE CROSS/BLUE SHIELD | Admitting: Family Medicine

## 2017-04-19 ENCOUNTER — Encounter: Payer: Self-pay | Admitting: Family Medicine

## 2017-04-19 VITALS — BP 112/72 | HR 63 | Ht 64.57 in | Wt 180.0 lb

## 2017-04-19 DIAGNOSIS — R8290 Unspecified abnormal findings in urine: Secondary | ICD-10-CM

## 2017-04-19 DIAGNOSIS — Z0000 Encounter for general adult medical examination without abnormal findings: Secondary | ICD-10-CM | POA: Diagnosis not present

## 2017-04-19 DIAGNOSIS — R202 Paresthesia of skin: Secondary | ICD-10-CM | POA: Diagnosis not present

## 2017-04-19 DIAGNOSIS — Z13220 Encounter for screening for lipoid disorders: Secondary | ICD-10-CM

## 2017-04-19 DIAGNOSIS — I4891 Unspecified atrial fibrillation: Secondary | ICD-10-CM

## 2017-04-19 DIAGNOSIS — Z1239 Encounter for other screening for malignant neoplasm of breast: Secondary | ICD-10-CM

## 2017-04-19 DIAGNOSIS — R591 Generalized enlarged lymph nodes: Secondary | ICD-10-CM

## 2017-04-19 NOTE — Progress Notes (Signed)
BP 112/72   Pulse 63   Ht 5' 4.57" (1.64 m)   Wt 180 lb (81.6 kg)   SpO2 96%   BMI 30.36 kg/m    Subjective:    Patient ID: Wendy Kelly, female    DOB: 07-24-1955, 61 y.o.   MRN: [id number]  HPI: Wendy Kelly is a 61 y.o. female presenting on 04/19/2017 for comprehensive medical examination. Current medical complaints include:  Still having a little bit swelling in her neck- had lymphadenopathy about 6 months ago. She thinks that it has gotten a little bit better, but it hasn't gone away. Had some shooting pains in her R big toe- had it for a couple of days then it went away.  Menopausal Symptoms: yes- having some hot flashes off the progesterone  Depression Screen done today and results listed below:  Depression screen [facility] 2/9 04/19/2017 04/13/2016 04/08/2015  Decreased Interest 0 0 0  Down, Depressed, Hopeless 0 0 0  PHQ - 2 Score 0 0 0    Past Medical History:  Past Medical History:  Diagnosis Date  . AA (aortic aneurysm) ([place])    a. 04/2011 Echo: Ao Root: 4.1cm, Asc Ao 4.7cm.  . Anxiety   . AV block, 2nd degree    a. 05/2005 - s/p MDT Adapta ADDR01 Dual Chamber PPM  . Bicuspid aortic valve   . Chest pain    a. 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  . Depression   . Expressive aphasia    a. ongoing since 04/2011 - seen by neurology - ? TIA vs. Migraine  . Facial numbness    a. ongoing since 04/2011 - seen by neurology - ? TIA vs. Migraine  . GERD (gastroesophageal reflux disease)   . Low blood pressure   . Presence of permanent cardiac pacemaker   . Syncope    a. 04/2011 Echo: EF 55-65%, No RWMA, Gr 1 DD.    Surgical History:  Past Surgical History:  Procedure Laterality Date  . ABDOMINAL WALL MESH  REMOVAL    . bladder tack    . cataract  2004  . CHOLECYSTECTOMY  2000  . DILATION AND CURETTAGE OF UTERUS    . EP IMPLANTABLE DEVICE N/A 06/04/2015   Procedure: PPM Generator Changeout;  Surgeon: Deboraha Sprang, MD;  Location: [facility];  Service: Cardiovascular;   Laterality: N/A;  . ESOPHAGOGASTRODUODENOSCOPY (EGD) WITH PROPOFOL N/A 12/18/2015   Procedure: ESOPHAGOGASTRODUODENOSCOPY (EGD) WITH gastric biopsy and dilation;  Surgeon: Lucilla Lame, MD;  Location: [facility];  Service: Endoscopy;  Laterality: N/A;  . PACEMAKER INSERTION  2006   Medtronic Adapta ADDR01  . TONSILLECTOMY    . VAGINAL HYSTERECTOMY  2013   mad/cope    Medications:  Current Outpatient Prescriptions on File Prior to Visit  Medication Sig  . aspirin 81 MG tablet Take 81 mg by mouth daily.  Marland Kitchen b complex vitamins tablet Take 1 tablet by mouth daily.  . Calcium 75 MG TABS Take 1 tablet by mouth daily.   Marland Kitchen CRANBERRY SOFT PO Take 1 each by mouth daily.  Marland Kitchen DHEA 10 MG TABS Take 10 mg by mouth every morning.  . fluticasone (FLONASE SENSIMIST) 27.5 MCG/SPRAY nasal spray   . ibuprofen (ADVIL,MOTRIN) 200 MG tablet Takes 2-3 tablets as needed.  . nitroGLYCERIN (NITROSTAT) 0.4 MG SL tablet Place 1 tablet (0.4 mg total) under the tongue every 5 (five) minutes as needed for chest pain.  . NON FORMULARY Vaginal tablet (estrogen and testosterone) 2-3 times weekly.  Marland Kitchen  PRESCRIPTION MEDICATION Place 1 tablet vaginally 2 (two) times a week. Tuesday and Saturdays E3 1mg  and Testosterone 1mg  compound  . sodium chloride (MURO 128) 5 % ophthalmic solution Place 1 drop into both eyes daily.   No current facility-administered medications on file prior to visit.     Allergies:  Allergies  Allergen Reactions  . Cephalosporins Swelling  . Phenylephrine-Guaifenesin Anaphylaxis    Night terrors  . Tape Rash  . Cefdinir Swelling       . Codeine Nausea And Vomiting  . Dexilant [Dexlansoprazole]     Pressure in head  . Entex Lq [Phenylephrine-Guaifenesin]     Night terrors  . Flagyl [Metronidazole] Swelling  . Guaifenesin & Derivatives Other (See Comments)    Night terrors  . Lactose Intolerance (Gi) Other (See Comments)    GI upset  . Naproxen Sodium     Itching/rash  . Other Other  (See Comments)    Flu vaccine:  Weakness, dizziness, tia type symptoms.    . Oxycodone Nausea And Vomiting    And itching  . Penicillins     Rash/hives  . Succinylcholine Other (See Comments)    Trouble waking up  . Sulfonamide Derivatives     Rash/itching  . Tetracyclines & Related Other (See Comments)    Unkown  . Wheat Bran   . Lactobacillus Itching  . Pseudoephedrine Rash  . Sulfa Antibiotics Rash    Social History:  Social History   Social History  . Marital status: Married    Spouse name: N/A  . Number of children: N/A  . Years of education: N/A   Occupational History  . Not on file.   Social History Main Topics  . Smoking status: Never Smoker  . Smokeless tobacco: Never Used     Comment: tobacco use -no  . Alcohol use No  . Drug use: No  . Sexual activity: Yes    Birth control/ protection: Surgical   Other Topics Concern  . Not on file   Social History Narrative   Lives in [place] with husband.  Works out regularly.  Works as a Geophysicist/field seismologist - owns own business.    History  Smoking Status  . Never Smoker  Smokeless Tobacco  . Never Used    Comment: tobacco use -no   History  Alcohol Use No    Family History:  Family History  Problem Relation Age of Onset  . COPD Mother        alive @ 87  . Stroke Father        died @ 60  . Breast cancer Maternal Aunt 70  . COPD Maternal Grandmother   . Ovarian cancer Neg Hx   . Diabetes Neg Hx     Past medical history, surgical history, medications, allergies, family history and social history reviewed with patient today and changes made to appropriate areas of the chart.   Review of Systems  Constitutional: Negative.   HENT: Negative.   Eyes: Negative.   Respiratory: Negative.   Cardiovascular: Negative.   Gastrointestinal: Negative.   Genitourinary: Negative.   Musculoskeletal: Negative.   Skin: Negative.   Neurological: Negative.   Endo/Heme/Allergies: Positive for environmental  allergies. Negative for polydipsia. Does not bruise/bleed easily.  Psychiatric/Behavioral: Negative.     All other ROS negative except what is listed above and in the HPI.      Objective:    BP 112/72   Pulse 63   Ht 5' 4.57" (1.64 m)  Wt 180 lb (81.6 kg)   SpO2 96%   BMI 30.36 kg/m   Wt Readings from Last 3 Encounters:  04/19/17 180 lb (81.6 kg)  01/28/17 184 lb 8 oz (83.7 kg)  01/06/17 182 lb (82.6 kg)    Physical Exam  Constitutional: She is oriented to person, place, and time. She appears well-developed and well-nourished. No distress.  HENT:  Head: Normocephalic and atraumatic.  Right Ear: Hearing, tympanic membrane, external ear and ear canal normal.  Left Ear: Hearing, tympanic membrane, external ear and ear canal normal.  Nose: Nose normal.  Mouth/Throat: Uvula is midline, oropharynx is clear and moist and mucous membranes are normal. No oropharyngeal exudate.  Eyes: Pupils are equal, round, and reactive to light. Conjunctivae, EOM and lids are normal. Right eye exhibits no discharge. Left eye exhibits no discharge. No scleral icterus.  Neck: Normal range of motion. Neck supple. No JVD present. No tracheal deviation present. No thyromegaly present.  Cardiovascular: Normal rate, regular rhythm, normal heart sounds and intact distal pulses.  Exam reveals no gallop and no friction rub.   No murmur heard. Pulmonary/Chest: Effort normal and breath sounds normal. No stridor. No respiratory distress. She has no wheezes. She has no rales. She exhibits no tenderness. Right breast exhibits no inverted nipple, no mass, no nipple discharge, no skin change and no tenderness. Left breast exhibits no inverted nipple, no mass, no nipple discharge, no skin change and no tenderness. Breasts are symmetrical.  Abdominal: Soft. Bowel sounds are normal. She exhibits no distension and no mass. There is no tenderness. There is no rebound and no guarding.  Genitourinary:  Genitourinary Comments:  Pelvic exam deferred with shared decision making  Musculoskeletal: Normal range of motion. She exhibits no edema, tenderness or deformity.  Lymphadenopathy:    She has cervical adenopathy.       Right cervical: Superficial cervical adenopathy present.  Neurological: She is alert and oriented to person, place, and time. She has normal reflexes. She displays normal reflexes. No cranial nerve deficit. She exhibits normal muscle tone. Coordination normal.  Skin: Skin is warm, dry and intact. No rash noted. She is not diaphoretic. No erythema. No pallor.  Psychiatric: She has a normal mood and affect. Her speech is normal and behavior is normal. Judgment and thought content normal. Cognition and memory are normal.  Nursing note and vitals reviewed.   Results for orders placed or performed in visit on 03/29/17  CUP PACEART REMOTE DEVICE CHECK  Result Value Ref Range   Date Time Interrogation Session [id number]    Pulse Generator Manufacturer MERM    Pulse Gen Model ADDRL1 Adapta    Pulse Gen Serial Number [id number] [facility] Name [facility]    Implantable Pulse Generator Type Implantable Pulse Generator    Implantable Pulse Generator Implant Date 01779390    Implantable Lead Manufacturer MERM    Implantable Lead Model 4092 CapSure SP Novus    Implantable Lead Serial Number [id number]    Implantable Lead Implant Date 30092330    Implantable Lead Location [id number]    Implantable Lead Manufacturer MERM    Implantable Lead Model 4592 CapSure SP Novus    Implantable Lead Serial Number [id number]    Implantable Lead Implant Date [id number]    Implantable Lead Location [id number]    Lead Channel Setting Sensing Sensitivity 4.00 mV   Lead Channel Setting Pacing Amplitude 2.000 V   Lead Channel Setting Pacing Pulse Width 0.40 ms   Lead Channel Setting  Pacing Amplitude 2.500 V   Lead Channel Impedance Value 582 ohm   Lead Channel Pacing Threshold Amplitude 0.375 V   Lead Channel Pacing  Threshold Pulse Width 0.40 ms   Lead Channel Impedance Value 781 ohm   Lead Channel Pacing Threshold Amplitude 1.000 V   Lead Channel Pacing Threshold Pulse Width 0.40 ms   Battery Status OK    Battery Remaining Longevity 150 mo   Battery Voltage 2.78 V   Battery Impedance 139 ohm   Brady Statistic AP VP Percent 0 %   Brady Statistic AS VP Percent 0 %   Brady Statistic AP VS Percent 10 %   Brady Statistic AS VS Percent 90 %   Eval Rhythm AS,VS       Assessment & Plan:   Problem List Items Addressed This Visit      Cardiovascular and Mediastinum   Atrial fibrillation (HCC)    Continue to follow with cardiology. Stable. Call with any concerns.        Other Visit Diagnoses    Routine general medical examination at a health care facility    -  Primary   Vaccines up to date. Screening labs checked today. Mammogram ordered. Pap N/A. Colonoscopy up to date. Call with any concerns.    Relevant Orders   CBC with Differential/Platelet   Comprehensive metabolic panel   Lipid Panel w/o Chol/HDL Ratio   TSH   UA/M w/rflx Culture, Routine   Lymphadenopathy       Chronic x 6 months. Will check CBC and obtain US. Await results. Call with any concerns.    Relevant Orders   CBC with Differential/Platelet   Comprehensive metabolic panel   US Soft Tissue Head/Neck   Paresthesias       Checking labs today. Await results. Has resolved. Call if it comes back.    Relevant Orders   Comprehensive metabolic panel   Foul smelling urine       Checking urine today. Await results.    Relevant Orders   Comprehensive metabolic panel   TSH   Screening for cholesterol level       Labs drawn today. Await results.   Relevant Orders   Lipid Panel w/o Chol/HDL Ratio   Screening for breast cancer       Mammogram ordered today.   Relevant Orders   MM DIGITAL SCREENING BILATERAL   UA/M w/rflx Culture, Routine       Follow up plan: Return in about 1 year (around 04/19/2018) for  Physical.   LABORATORY TESTING:  - Pap smear: not applicable  IMMUNIZATIONS:   - Tdap: Tetanus vaccination status reviewed: last tetanus booster within 10 years. - Influenza: Not applicable, allergic - Pneumovax: Not applicable - Prevnar: Not applicable - Zostavax vaccine: Given elsewhere  SCREENING: -Mammogram: Ordered today  - Colonoscopy: Up to date  - Bone Density: Not applicable   PATIENT COUNSELING:   Advised to take 1 mg of folate supplement per day if capable of pregnancy.   Sexuality: Discussed sexually transmitted diseases, partner selection, use of condoms, avoidance of unintended pregnancy  and contraceptive alternatives.   Advised to avoid cigarette smoking.  I discussed with the patient that most people either abstain from alcohol or drink within safe limits (<=14/week and <=4 drinks/occasion for males, <=7/weeks and <= 3 drinks/occasion for females) and that the risk for alcohol disorders and other health effects rises proportionally with the number of drinks per week and how often a drinker exceeds daily limits.  Discussed cessation/primary prevention of drug use and availability of treatment for abuse.   Diet: Encouraged to adjust caloric intake to maintain  or achieve ideal body weight, to reduce intake of dietary saturated fat and total fat, to limit sodium intake by avoiding high sodium foods and not adding table salt, and to maintain adequate dietary potassium and calcium preferably from fresh fruits, vegetables, and low-fat dairy products.    stressed the importance of regular exercise  Injury prevention: Discussed safety belts, safety helmets, smoke detector, smoking near bedding or upholstery.   Dental health: Discussed importance of regular tooth brushing, flossing, and dental visits.    NEXT PREVENTATIVE PHYSICAL DUE IN 1 YEAR. Return in about 1 year (around 04/19/2018) for Physical.

## 2017-04-19 NOTE — Assessment & Plan Note (Signed)
Continue to follow with cardiology. Stable. Call with any concerns.  ?

## 2017-04-20 ENCOUNTER — Other Ambulatory Visit: Payer: BLUE CROSS/BLUE SHIELD

## 2017-04-20 DIAGNOSIS — R591 Generalized enlarged lymph nodes: Secondary | ICD-10-CM | POA: Diagnosis not present

## 2017-04-20 DIAGNOSIS — R202 Paresthesia of skin: Secondary | ICD-10-CM

## 2017-04-20 DIAGNOSIS — Z1239 Encounter for other screening for malignant neoplasm of breast: Secondary | ICD-10-CM

## 2017-04-20 DIAGNOSIS — Z1231 Encounter for screening mammogram for malignant neoplasm of breast: Secondary | ICD-10-CM | POA: Diagnosis not present

## 2017-04-20 DIAGNOSIS — Z0000 Encounter for general adult medical examination without abnormal findings: Secondary | ICD-10-CM | POA: Diagnosis not present

## 2017-04-20 DIAGNOSIS — Z13220 Encounter for screening for lipoid disorders: Secondary | ICD-10-CM

## 2017-04-20 DIAGNOSIS — R8290 Unspecified abnormal findings in urine: Secondary | ICD-10-CM | POA: Diagnosis not present

## 2017-04-20 LAB — UA/M W/RFLX CULTURE, ROUTINE
BILIRUBIN UA: NEGATIVE
Glucose, UA: NEGATIVE
Ketones, UA: NEGATIVE
Nitrite, UA: NEGATIVE
PH UA: 6 (ref 5.0–7.5)
Protein, UA: NEGATIVE
RBC UA: NEGATIVE
Specific Gravity, UA: 1.02 (ref 1.005–1.030)
Urobilinogen, Ur: 0.2 mg/dL (ref 0.2–1.0)

## 2017-04-20 LAB — MICROSCOPIC EXAMINATION
BACTERIA UA: NONE SEEN
RBC, UA: NONE SEEN /hpf (ref 0–?)

## 2017-04-21 LAB — COMPREHENSIVE METABOLIC PANEL
A/G RATIO: 2.4 — AB (ref 1.2–2.2)
ALBUMIN: 4.5 g/dL (ref 3.6–4.8)
ALK PHOS: 92 IU/L (ref 39–117)
ALT: 26 IU/L (ref 0–32)
AST: 18 IU/L (ref 0–40)
BILIRUBIN TOTAL: 1 mg/dL (ref 0.0–1.2)
BUN / CREAT RATIO: 24 (ref 12–28)
BUN: 17 mg/dL (ref 8–27)
CO2: 24 mmol/L (ref 20–29)
Calcium: 9.3 mg/dL (ref 8.7–10.3)
Chloride: 107 mmol/L — ABNORMAL HIGH (ref 96–106)
Creatinine, Ser: 0.7 mg/dL (ref 0.57–1.00)
GFR calc Af Amer: 107 mL/min/{1.73_m2} (ref >59)
GFR calc non Af Amer: 93 mL/min/{1.73_m2} (ref >59)
Globulin, Total: 1.9 g/dL (ref 1.5–4.5)
Glucose: 88 mg/dL (ref 65–99)
POTASSIUM: 4.3 mmol/L (ref 3.5–5.2)
SODIUM: 147 mmol/L — AB (ref 134–144)
Total Protein: 6.4 g/dL (ref 6.0–8.5)

## 2017-04-21 LAB — CBC WITH DIFFERENTIAL/PLATELET
BASOS: 0 %
Basophils Absolute: 0 10*3/uL (ref 0.0–0.2)
EOS (ABSOLUTE): 0.1 10*3/uL (ref 0.0–0.4)
EOS: 2 %
HEMATOCRIT: 42.1 % (ref 34.0–46.6)
Hemoglobin: 13.9 g/dL (ref 11.1–15.9)
Immature Grans (Abs): 0 10*3/uL (ref 0.0–0.1)
Immature Granulocytes: 0 %
LYMPHS ABS: 1.2 10*3/uL (ref 0.7–3.1)
Lymphs: 17 %
MCH: 30.6 pg (ref 26.6–33.0)
MCHC: 33 g/dL (ref 31.5–35.7)
MCV: 93 fL (ref 79–97)
MONOS ABS: 0.6 10*3/uL (ref 0.1–0.9)
Monocytes: 8 %
NEUTROS ABS: 5.4 10*3/uL (ref 1.4–7.0)
Neutrophils: 73 %
Platelets: 169 10*3/uL (ref 150–379)
RBC: 4.54 x10E6/uL (ref 3.77–5.28)
RDW: 13.7 % (ref 12.3–15.4)
WBC: 7.4 10*3/uL (ref 3.4–10.8)

## 2017-04-21 LAB — LIPID PANEL W/O CHOL/HDL RATIO
CHOLESTEROL TOTAL: 145 mg/dL (ref 100–199)
HDL: 54 mg/dL (ref >39)
LDL Calculated: 79 mg/dL (ref 0–99)
TRIGLYCERIDES: 58 mg/dL (ref 0–149)
VLDL Cholesterol Cal: 12 mg/dL (ref 5–40)

## 2017-04-21 LAB — TSH: TSH: 1.84 u[IU]/mL (ref 0.450–4.500)

## 2017-04-29 ENCOUNTER — Ambulatory Visit: Payer: Self-pay

## 2017-04-29 ENCOUNTER — Ambulatory Visit
Admission: RE | Admit: 2017-04-29 | Discharge: 2017-04-29 | Disposition: A | Discharge status: Home / Self Care | Payer: BLUE CROSS/BLUE SHIELD | Source: Ambulatory Visit | Attending: Family Medicine | Admitting: Family Medicine

## 2017-04-29 DIAGNOSIS — R591 Generalized enlarged lymph nodes: Secondary | ICD-10-CM

## 2017-04-29 DIAGNOSIS — R599 Enlarged lymph nodes, unspecified: Secondary | ICD-10-CM | POA: Diagnosis not present

## 2017-05-02 ENCOUNTER — Telehealth: Payer: Self-pay | Admitting: Family Medicine

## 2017-05-02 NOTE — Telephone Encounter (Addendum)
Called to speak to patient. She is agreeable to seeing surgery, but would like to see a different provider. Will look into that and call us or mychart message with who she'd like to see. She is aware of her results. CRM generated.

## 2017-05-02 NOTE — Telephone Encounter (Signed)
Called patient with the results of her [place]. LMOM for her to call back.   The lump in her neck does appear to be a lymph node that appears to be enlarged. Her CBC was normal, which is a good thing. However, with it being there for a while, they recommended doing a CT scan to look at it more- I think that'll give her extra radiation and won't really give [place] much of an answer. I'd like her to see the surgeon to have him look at it and possibly take a piece of it to make sure there's nothing going on with it. If she is OK with that, let me know and I'll get her into see the surgeon (I usually use Dr. Bary Castilla). If she'd like to talk to me- let me know. Thanks! (CRM generated to transfer to office)

## 2017-05-04 ENCOUNTER — Encounter: Payer: Self-pay | Admitting: Family Medicine

## 2017-05-05 NOTE — Telephone Encounter (Signed)
Patient replied with mychart message.

## 2017-05-12 DIAGNOSIS — IMO0002 Reserved for concepts with insufficient information to code with codable children: Secondary | ICD-10-CM

## 2017-05-12 HISTORY — DX: Reserved for concepts with insufficient information to code with codable children: IMO0002

## 2017-05-18 DIAGNOSIS — R221 Localized swelling, mass and lump, neck: Secondary | ICD-10-CM | POA: Diagnosis not present

## 2017-05-23 ENCOUNTER — Ambulatory Visit
Admission: RE | Admit: 2017-05-23 | Discharge: 2017-05-23 | Disposition: A | Discharge status: Home / Self Care | Payer: BLUE CROSS/BLUE SHIELD | Source: Ambulatory Visit | Attending: Family Medicine | Admitting: Family Medicine

## 2017-05-23 ENCOUNTER — Other Ambulatory Visit: Payer: Self-pay | Admitting: Otolaryngology

## 2017-05-23 DIAGNOSIS — Z1231 Encounter for screening mammogram for malignant neoplasm of breast: Secondary | ICD-10-CM

## 2017-05-23 DIAGNOSIS — R221 Localized swelling, mass and lump, neck: Secondary | ICD-10-CM

## 2017-05-27 ENCOUNTER — Other Ambulatory Visit: Payer: Self-pay | Admitting: Radiology

## 2017-05-30 ENCOUNTER — Ambulatory Visit
Admission: RE | Admit: 2017-05-30 | Discharge: 2017-05-30 | Disposition: A | Discharge status: Home / Self Care | Payer: BLUE CROSS/BLUE SHIELD | Source: Ambulatory Visit | Attending: Otolaryngology | Admitting: Otolaryngology

## 2017-05-30 DIAGNOSIS — Z880 Allergy status to penicillin: Secondary | ICD-10-CM | POA: Diagnosis not present

## 2017-05-30 DIAGNOSIS — K219 Gastro-esophageal reflux disease without esophagitis: Secondary | ICD-10-CM | POA: Diagnosis not present

## 2017-05-30 DIAGNOSIS — Z950 Presence of cardiac pacemaker: Secondary | ICD-10-CM | POA: Insufficient documentation

## 2017-05-30 DIAGNOSIS — F329 Major depressive disorder, single episode, unspecified: Secondary | ICD-10-CM | POA: Diagnosis not present

## 2017-05-30 DIAGNOSIS — C770 Secondary and unspecified malignant neoplasm of lymph nodes of head, face and neck: Secondary | ICD-10-CM | POA: Insufficient documentation

## 2017-05-30 DIAGNOSIS — Z882 Allergy status to sulfonamides status: Secondary | ICD-10-CM | POA: Diagnosis not present

## 2017-05-30 DIAGNOSIS — Q231 Congenital insufficiency of aortic valve: Secondary | ICD-10-CM | POA: Insufficient documentation

## 2017-05-30 DIAGNOSIS — R221 Localized swelling, mass and lump, neck: Secondary | ICD-10-CM | POA: Diagnosis not present

## 2017-05-30 DIAGNOSIS — I959 Hypotension, unspecified: Secondary | ICD-10-CM | POA: Insufficient documentation

## 2017-05-30 DIAGNOSIS — I719 Aortic aneurysm of unspecified site, without rupture: Secondary | ICD-10-CM | POA: Insufficient documentation

## 2017-05-30 DIAGNOSIS — Z7982 Long term (current) use of aspirin: Secondary | ICD-10-CM | POA: Diagnosis not present

## 2017-05-30 DIAGNOSIS — F419 Anxiety disorder, unspecified: Secondary | ICD-10-CM | POA: Diagnosis not present

## 2017-05-30 DIAGNOSIS — R590 Localized enlarged lymph nodes: Secondary | ICD-10-CM | POA: Insufficient documentation

## 2017-05-30 MED ORDER — SODIUM CHLORIDE 0.9 % IV SOLN
INTRAVENOUS | Status: DC
  Administered 2017-05-30: 1000 mL via INTRAVENOUS

## 2017-05-30 MED ORDER — FENTANYL CITRATE (PF) 100 MCG/2ML IJ SOLN
INTRAMUSCULAR | Status: AC | PRN
  Administered 2017-05-30 (×2): 50 ug via INTRAVENOUS

## 2017-05-30 MED ORDER — MIDAZOLAM HCL 2 MG/2ML IJ SOLN
INTRAMUSCULAR | Status: AC | PRN
  Administered 2017-05-30 (×2): 1 mg via INTRAVENOUS

## 2017-05-30 NOTE — H&P (Signed)
Chief Complaint: Patient was seen in consultation today for right cervical lymph node biopsy at the request of Vaught,Creighton  Referring Physician(s): Vaught,Creighton  Patient Status: [facility] - Out-pt  History of Present Illness: Wendy Kelly is a 61 y.o. female with history of a palpable enlarged right cervical lymph node since approximately January. Ultrasound in October demonstrated an enlarged node measuring 1.7 cm in short axis.  Some associated right ear pain.  No other palpable enlarged lymph nodes.  Past Medical History:  Diagnosis Date  . AA (aortic aneurysm) ([place])    a. 04/2011 Echo: Ao Root: 4.1cm, Asc Ao 4.7cm.  . Anxiety   . AV block, 2nd degree    a. 05/2005 - s/p MDT Adapta ADDR01 Dual Chamber PPM  . Bicuspid aortic valve   . Chest pain    a. 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  . Depression   . Expressive aphasia    a. ongoing since 04/2011 - seen by neurology - ? TIA vs. Migraine  . Facial numbness    a. ongoing since 04/2011 - seen by neurology - ? TIA vs. Migraine  . GERD (gastroesophageal reflux disease)   . Low blood pressure   . Presence of permanent cardiac pacemaker   . Syncope    a. 04/2011 Echo: EF 55-65%, No RWMA, Gr 1 DD.    Past Surgical History:  Procedure Laterality Date  . ABDOMINAL WALL MESH  REMOVAL    . bladder tack    . cataract  2004  . CHOLECYSTECTOMY  2000  . DILATION AND CURETTAGE OF UTERUS    . ESOPHAGOGASTRODUODENOSCOPY (EGD) WITH gastric biopsy and dilation N/A 12/18/2015   Performed by Lucilla Lame, MD at [place]  . PACEMAKER INSERTION  2006   Medtronic Adapta ADDR01  . PPM Generator Changeout N/A 06/04/2015   Performed by Deboraha Sprang, MD at [facility]  . TONSILLECTOMY    . VAGINAL HYSTERECTOMY  2013   mad/cope    Allergies: Cephalosporins; Phenylephrine-guaifenesin; Tape; Cefdinir; Codeine; Dexilant [dexlansoprazole]; Entex lq [phenylephrine-guaifenesin]; Flagyl [metronidazole]; Guaifenesin &  derivatives; Lactose intolerance (gi); Naproxen sodium; Other; Oxycodone; Penicillins; Succinylcholine; Sulfonamide derivatives; Tetracyclines & related; Wheat bran; Lactobacillus; Pseudoephedrine; and Sulfa antibiotics  Medications: Prior to Admission medications   Medication Sig Start Date End Date Taking? Authorizing Provider  aspirin 81 MG tablet Take 81 mg by mouth daily.   Yes [provider]  b complex vitamins tablet Take 1 tablet by mouth daily.   Yes [provider]  Calcium 75 MG TABS Take 1 tablet by mouth daily.    Yes [provider]  CRANBERRY SOFT PO Take 1 each by mouth daily.   Yes [provider]  DHEA 10 MG TABS Take 10 mg by mouth every morning. 04/20/16  Yes Johnson, Megan P, DO  fluticasone (FLONASE SENSIMIST) 27.5 MCG/SPRAY nasal spray    Yes [provider]  ibuprofen (ADVIL,MOTRIN) 200 MG tablet Takes 2-3 tablets as needed.   Yes [provider]  nitroGLYCERIN (NITROSTAT) 0.4 MG SL tablet Place 1 tablet (0.4 mg total) under the tongue every 5 (five) minutes as needed for chest pain. 12/16/16  Yes Gollan, Kathlene November, MD  NON FORMULARY Vaginal tablet (estrogen and testosterone) 2-3 times weekly.   Yes [provider]  PRESCRIPTION MEDICATION Place 1 tablet vaginally 2 (two) times a week. Tuesday and Saturdays E3 1mg  and Testosterone 1mg  compound   Yes [provider]  sodium chloride (MURO 128) 5 % ophthalmic solution  Place 1 drop into both eyes daily.   Yes [provider]     Family History  Problem Relation Age of Onset  . COPD Mother        alive @ 23  . Stroke Father        died @ 65  . Breast cancer Maternal Aunt 70  . COPD Maternal Grandmother   . Ovarian cancer Neg Hx   . Diabetes Neg Hx     Social History   Socioeconomic History  . Marital status: Married    Spouse name: Not on file  . Number of children: Not on file  . Years of education: Not on file  . Highest  education level: Not on file  Social Needs  . Financial resource strain: Not on file  . Food insecurity - worry: Not on file  . Food insecurity - inability: Not on file  . Transportation needs - medical: Not on file  . Transportation needs - non-medical: Not on file  Occupational History  . Not on file  Tobacco Use  . Smoking status: Never Smoker  . Smokeless tobacco: Never Used  . Tobacco comment: tobacco use -no  Substance and Sexual Activity  . Alcohol use: No  . Drug use: No  . Sexual activity: Yes    Birth control/protection: Surgical  Other Topics Concern  . Not on file  Social History Narrative   Lives in [place] with husband.  Works out regularly.  Works as a Geophysicist/field seismologist - owns own business.     Review of Systems: A 12 point ROS discussed and pertinent positives are indicated in the HPI above.  All other systems are negative.  Review of Systems  Constitutional: Negative.   HENT: Positive for ear pain.   Respiratory: Negative.   Cardiovascular: Negative.   Gastrointestinal: Negative.   Genitourinary: Negative.   Musculoskeletal: Negative.   Neurological: Negative.   Hematological: Negative.     Vital Signs: BP (!) 114/55   Pulse 77   Temp 98 F (36.7 C) (Oral)   Resp 20   Ht 5\' 4"  (1.626 m)   Wt 180 lb (81.6 kg)   SpO2 98%   BMI 30.90 kg/m   Physical Exam  Constitutional: She is oriented to person, place, and time. She appears well-developed and well-nourished. No distress.  HENT:  Head: Normocephalic and atraumatic.  Neck: Neck supple. No JVD present. No tracheal deviation present. No thyromegaly present.  Palpable enlarged right cervical lymphadenopathy  Cardiovascular: Normal rate, regular rhythm and normal heart sounds. Exam reveals no gallop and no friction rub.  No murmur heard. Pulmonary/Chest: Effort normal and breath sounds normal. No stridor. No respiratory distress. She has no wheezes. She has no rales.  Abdominal: Soft. She  exhibits no distension. There is no tenderness. There is no rebound and no guarding.  Musculoskeletal: She exhibits no edema.  Lymphadenopathy:    She has cervical adenopathy.  Neurological: She is alert and oriented to person, place, and time.  Skin: Skin is warm and dry. She is not diaphoretic.  Vitals reviewed.   Imaging: Mm Screening Breast Tomo Bilateral  Result Date: 05/23/2017 CLINICAL DATA:  Screening. EXAM: 2D DIGITAL SCREENING BILATERAL MAMMOGRAM WITH CAD AND ADJUNCT TOMO COMPARISON:  Previous exam(s). ACR Breast Density Category a: The breast tissue is almost entirely fatty. FINDINGS: There are no findings suspicious for malignancy. Images were processed with CAD. IMPRESSION: No mammographic evidence of malignancy. A result letter of this screening mammogram  will be mailed directly to the patient. RECOMMENDATION: Screening mammogram in one year. (Code:SM-B-01Y) BI-RADS CATEGORY  1: Negative. Electronically Signed   By: Franki Cabot M.D.   On: 05/23/2017 10:46    Labs:  CBC: Recent Labs    11/15/16 1006 04/20/17 0915  WBC 5.6 7.4  HGB 14.0 13.9  HCT 41.5 42.1  PLT 178 169    COAGS: No results for input(s): INR, APTT in the last 8760 hours.  BMP: Recent Labs    12/28/16 1257 04/20/17 0915  NA  --  147*  K  --  4.3  CL  --  107*  CO2  --  24  GLUCOSE  --  88  BUN  --  17  CALCIUM  --  9.3  CREATININE 0.70 0.70  GFRNONAA  --  93  GFRAA  --  107    LIVER FUNCTION TESTS: Recent Labs    04/20/17 0915  BILITOT 1.0  AST 18  ALT 26  ALKPHOS 92  PROT 6.4  ALBUMIN 4.5    Assessment and Plan:  For ultrasound guided core biopsy of enlarged right cervical lymph node today.  Risks and benefits discussed with the patient including, but not limited to bleeding, infection, damage to adjacent structures or low yield requiring additional tests. All of the patient's questions were answered, patient is agreeable to proceed. Consent signed and in chart.  Thank you  for this interesting consult.  I greatly enjoyed meeting Wendy Kelly and look forward to participating in their care.  A copy of this report was sent to the requesting provider on this date.  Electronically Signed: Azzie Roup, MD 05/30/2017, 1:10 PM   I spent a total of 30 Minutes  in face to face in clinical consultation, greater than 50% of which was counseling/coordinating care for right cervical lymph node biopsy.

## 2017-05-30 NOTE — Progress Notes (Signed)
Patient clinically stable post lymph node biopsy, tolerated well. Husband at bedside. No bleeding nor hematoma at right neck area. Taking po's without difficulty. Vitals stable. Dr Kathlene Cote out to speak with husband with questions answered.

## 2017-05-30 NOTE — Procedures (Signed)
Interventional Radiology Procedure Note  Procedure: US guided right cervical lymph node biopsy   Complications: None  Estimated Blood Loss: < 10 mL  18 G core biopsy x 6 of enlarged right cervical lymph node.  Venetia Night. Kathlene Cote, M.D Pager:  [phone]

## 2017-05-30 NOTE — Discharge Instructions (Signed)
o Moderate Conscious Sedation, Adult, Care After These instructions provide you with information about caring for yourself after your procedure. Your health care provider may also give you more specific instructions. Your treatment has been planned according to current medical practices, but problems sometimes occur. Call your health care provider if you have any problems or questions after your procedure. What can I expect after the procedure? After your procedure, it is common:  To feel sleepy for several hours.  To feel clumsy and have poor balance for several hours.  To have poor judgment for several hours.  To vomit if you eat too soon.  Follow these instructions at home: For at least 24 hours after the procedure:   Do not: ? Participate in activities where you could fall or become injured. ? Drive. ? Use heavy machinery. ? Drink alcohol. ? Take sleeping pills or medicines that cause drowsiness. ? Make important decisions or sign legal documents. ? Take care of children on your own.  Rest. Eating and drinking  Follow the diet recommended by your health care provider.  If you vomit: ? Drink water, juice, or soup when you can drink without vomiting. ? Make sure you have little or no nausea before eating solid foods. General instructions  Have a responsible adult stay with you until you are awake and alert.  Take over-the-counter and prescription medicines only as told by your health care provider.  If you smoke, do not smoke without supervision.  Keep all follow-up visits as told by your health care provider. This is important. Contact a health care provider if:  You keep feeling nauseous or you keep vomiting.  You feel light-headed.  You develop a rash.  You have a fever. Get help right away if:  You have trouble breathing. This information is not intended to replace advice given to you by your health care provider. Make sure you discuss any questions you  have with your health care provider. Document Released: 04/18/2013 Document Revised: 12/01/2015 Document Reviewed: 10/18/2015 Elsevier Interactive Patient Education  2018 [place]. pen Lymph Node Biopsy, Care After Refer to this sheet in the next few weeks. These instructions provide you with information about caring for yourself after your procedure. Your health care provider may also give you more specific instructions. Your treatment has been planned according to current medical practices, but problems sometimes occur. Call your health care provider if you have any problems or questions after your procedure. What can I expect after the procedure? After the procedure, it is common to have:  Bruising.  Soreness.  Mild swelling.  Follow these instructions at home: Medicines  Take over-the-counter and prescription medicines only as told by your health care provider.  If you were prescribed an antibiotic medicine, take it as told by your health care provider. Do not stop taking the antibiotic even if you start to feel better. Incision care   Follow instructions from your health care provider about how to take care of your incision. Make sure you: ? Wash your hands with soap and water before you change your bandage (dressing). If soap and water are not available, use hand sanitizer. ? Change your dressing as told by your health care provider. ? Leave stitches (sutures), skin glue, or adhesive strips in place. These skin closures may need to stay in place for 2 weeks or longer. If adhesive strip edges start to loosen and curl up, you may trim the loose edges. Do not remove adhesive strips completely unless  your health care provider tells you to do that.  Check your incision area every day for signs of infection. Check for: ? More redness, swelling, or pain. ? More fluid or blood. ? Warmth. ? Pus or a bad smell. Driving  Do not drive for 24 hours if you received a sedative.  Do  not drive or operate heavy machinery while taking prescription pain medicine. General instructions   It is your responsibility to get the results of your procedure. Ask your health care provider or the department performing the procedure when your results will be ready.  Return to your normal activities as told by your health care provider. Ask your health care provider what activities are safe for you.  Do not take baths, swim, or use a hot tub until your health care provider approves.  Wear compression stockings as told by your health care provider. These stockings help to prevent blood clots and reduce swelling in your legs.  Keep all follow-up visits as told by your health care provider. This is important. Contact a health care provider if:  You have more redness, swelling, or pain around your incision.  You have more fluid or blood coming from your incision.  Your incision feels warm to the touch.  You have pus or a bad smell coming from your incision.  You have a fever.  You have pain or numbness that gets worse or lasts longer than a few days. This information is not intended to replace advice given to you by your health care provider. Make sure you discuss any questions you have with your health care provider. Document Released: 07/25/2015 Document Revised: 12/04/2015 Document Reviewed: 10/23/2014 Elsevier Interactive Patient Education  [employer].

## 2017-05-31 ENCOUNTER — Telehealth: Payer: Self-pay | Admitting: Cardiovascular Disease

## 2017-05-31 NOTE — Telephone Encounter (Signed)
Received request for Cardiac clearance for upcoming excision of Lymph node on 06/09/17 with Dr. Pryor Ochoa and patient has pacemaker. Please route clearance to Dr. Pryor Ochoa, unable to read numbers on fax received. Spoke with patient and she denies any changes since her last office visit.

## 2017-06-01 ENCOUNTER — Other Ambulatory Visit: Payer: Self-pay | Admitting: Pathology

## 2017-06-01 ENCOUNTER — Other Ambulatory Visit: Payer: Self-pay | Admitting: Otolaryngology

## 2017-06-01 DIAGNOSIS — C770 Secondary and unspecified malignant neoplasm of lymph nodes of head, face and neck: Secondary | ICD-10-CM

## 2017-06-01 DIAGNOSIS — R221 Localized swelling, mass and lump, neck: Secondary | ICD-10-CM

## 2017-06-01 DIAGNOSIS — R591 Generalized enlarged lymph nodes: Secondary | ICD-10-CM

## 2017-06-01 DIAGNOSIS — R599 Enlarged lymph nodes, unspecified: Secondary | ICD-10-CM

## 2017-06-05 NOTE — Telephone Encounter (Signed)
Acceptable risk for procedure, No further testing needed

## 2017-06-06 LAB — SURGICAL PATHOLOGY

## 2017-06-06 NOTE — Telephone Encounter (Signed)
Clearance routed to Dr. Pryor Ochoa with instructions to contact us if he needs any additional information.

## 2017-06-09 ENCOUNTER — Ambulatory Visit
Admission: RE | Admit: 2017-06-09 | Discharge: 2017-06-09 | Disposition: A | Discharge status: Home / Self Care | Payer: BLUE CROSS/BLUE SHIELD | Source: Ambulatory Visit | Attending: Otolaryngology | Admitting: Otolaryngology

## 2017-06-09 DIAGNOSIS — R591 Generalized enlarged lymph nodes: Secondary | ICD-10-CM

## 2017-06-09 DIAGNOSIS — C770 Secondary and unspecified malignant neoplasm of lymph nodes of head, face and neck: Secondary | ICD-10-CM | POA: Insufficient documentation

## 2017-06-09 DIAGNOSIS — R599 Enlarged lymph nodes, unspecified: Secondary | ICD-10-CM | POA: Diagnosis not present

## 2017-06-09 LAB — GLUCOSE, CAPILLARY: GLUCOSE-CAPILLARY: 91 mg/dL (ref 65–99)

## 2017-06-09 MED ORDER — FLUDEOXYGLUCOSE F - 18 (FDG) INJECTION
12.8900 | Freq: Once | INTRAVENOUS | Status: AC | PRN
  Administered 2017-06-09: 12.89 via INTRAVENOUS

## 2017-06-10 DIAGNOSIS — R490 Dysphonia: Secondary | ICD-10-CM | POA: Diagnosis not present

## 2017-06-10 DIAGNOSIS — C770 Secondary and unspecified malignant neoplasm of lymph nodes of head, face and neck: Secondary | ICD-10-CM | POA: Diagnosis not present

## 2017-06-12 NOTE — Progress Notes (Signed)
Patient ID: Wendy Kelly, female   DOB: [birth year], 61 y.o.   MRN: [id number] Cardiology Office Note  Date:  06/14/2017   ID:  Wendy Kelly, DOB 03/24/1956, MRN [id number]  PCP:  Valerie Roys, DO   Chief Complaint  Patient presents with  . other    6 month f/u no complaints today. Meds reviewed verbally with pt.    HPI:  Wendy Kelly is a very pleasant 61 year old woman with a history of  anxiety ascending aorta aneurysm estimated at 4.6 to 4.8 cm that has been monitored with echocardiography and CT scans, No significant change since 2010 suspected bicuspid aortic valve on previous echocardiograms (no TEE performed) syncope second-degree AV block and pacemaker placement in November 2006,  episode of TIA-type symptoms with right facial droop, expressive aphasia that resolved,  intermittent chest pain, atypical in nature occasional nausea Chronic orthostasis , which she prefers to manage with fluids, previously Declined medications She presents for routine followup of her chest pain and ascending aorta dilation  New finding of enlarged lymph node right neck concerning for carcinoma on PET scan Concern for right base of tongue She is scheduled for additional biopsies this week with ear nose throat  The above has caused significant stress/anxiety She takes care of her 51 year old mother, no other help from family 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    Echocardiogram January 2018 Aortic root: The aortic root was dilated at 4.2cm. - Ascending aorta: The ascending aorta was dilated at 4.6cm. No significant change  Last CT chest December 2015, aorta 4.6 cm at that time No change compared to CT scan August 2011, at that time 4.6 x 4.8 cm  Son recently diagnosed with dilated ascending aorta 4.2 cm, he is out of state  She has some atypical chest pain, improved symptoms with accupuncture and chiropractic Some issues with swallowing  CT scan neck reviewed images pulled up in the office today no significant carotid disease, aortic arch disease/calcification  She was seen in December 2015 in the hospital for chest pain. Echocardiogram was unchanged, chest pain felt to be noncardiac. Echocardiogram May and December 2015, relatively no change.   CT scan in November 2013. 4.7 cm ascending aorta at that time  She has had low potassium in the past on visits to the emergency room for chest pain  Last stress test was April 2013 performed for chest pain. Equivocal study, no large regions of ischemia, some attenuation artifact noted.  episode of tachycardia followed by chest pain 05/18/2012. Workup in the hospital was essentially negative. She had workup including carotid ultrasound that showed no significant disease.   Previous lab work shows total cholesterol 151, LDL 84, HDL 55, TSH 1.8  PMH:   has a past medical history of AA (aortic aneurysm) ([place]), Anxiety, AV block, 2nd degree, Bicuspid aortic valve, Chest pain, Depression, Expressive aphasia, Facial numbness, GERD (gastroesophageal reflux disease), Low blood  pressure, Presence of permanent cardiac pacemaker, Squamous cell carcinoma (05/2017), and Syncope.  PSH:    Past Surgical History:  Procedure Laterality Date  . ABDOMINAL WALL MESH  REMOVAL    . bladder tack    . cataract Bilateral 2004  . CHOLECYSTECTOMY  2000   . DILATION AND CURETTAGE OF UTERUS    . EP IMPLANTABLE DEVICE N/A 06/04/2015   Procedure: PPM Generator Changeout;  Surgeon: Deboraha Sprang, MD;  Location: [facility];  Service: Cardiovascular;  Laterality: N/A;  . ESOPHAGOGASTRODUODENOSCOPY (EGD) WITH PROPOFOL N/A 12/18/2015   Procedure: ESOPHAGOGASTRODUODENOSCOPY (EGD) WITH gastric biopsy and dilation;  Surgeon: Lucilla Lame, MD;  Location: [facility];  Service: Endoscopy;  Laterality: N/A;  . EYE SURGERY    . INSERT / REPLACE / REMOVE PACEMAKER    . PACEMAKER INSERTION  2006   Medtronic Adapta ADDR01  . TONSILLECTOMY    . TONSILLECTOMY    . VAGINAL HYSTERECTOMY  2013   mad/cope    Current Outpatient Medications  Medication Sig Dispense Refill  . b complex vitamins tablet Take 1 tablet by mouth daily.    Marland Kitchen CRANBERRY SOFT PO Take 2 each by mouth daily.     Marland Kitchen DHEA 10 MG TABS Take 10 mg by mouth every morning. 90 tablet 3  . Digestive Enzymes (DIGESTIVE ENZYME PO) Take 1-2 capsules by mouth daily.    . fluticasone (FLONASE SENSIMIST) 27.5 MCG/SPRAY nasal spray Place 1 spray into the nose daily.     Marland Kitchen ibuprofen (ADVIL,MOTRIN) 200 MG tablet Takes 2-3 tablets as needed.    . nitroGLYCERIN (NITROSTAT) 0.4 MG SL tablet Place 1 tablet (0.4 mg total) under the tongue every 5 (five) minutes as needed for chest pain. 25 tablet 3  . NON FORMULARY Take by mouth daily. Bone Up (calcium)     . PRESCRIPTION MEDICATION Place 1 tablet vaginally 3 (three) times a week. E3 1mg  and Testosterone 1mg  compound    . sodium chloride (MURO 128) 5 % ophthalmic solution Place 1 drop into both eyes daily.    Marland Kitchen aspirin 81 MG tablet Take 81 mg by mouth daily.     No current facility-administered medications for this visit.      Allergies:   Cephalosporins; Phenylephrine-guaifenesin; Tape; Cefdinir; Codeine; Dairy aid [lactase]; Dexilant [dexlansoprazole]; Entex lq [phenylephrine-guaifenesin]; Flagyl [metronidazole]; Guaifenesin & derivatives;  Lactose intolerance (gi); Naproxen sodium; Other; Oxycodone; Penicillins; Succinylcholine; Sulfonamide derivatives; Tetracyclines & related; Wheat bran; Lactobacillus; Pseudoephedrine; and Sulfa antibiotics   Social History:  The patient  reports that  has never smoked. she has never used smokeless tobacco. She reports that she does not drink alcohol or use drugs.   Family History:   family history includes Atrial fibrillation in her mother; Breast cancer (age of onset: 19) in her maternal aunt; COPD in her maternal grandmother and mother; Stroke in her father.    Review of Systems: Review of Systems  Constitutional: Negative.   Respiratory: Negative.   Cardiovascular: Negative.   Gastrointestinal: Negative.   Musculoskeletal: Negative.   Neurological: Negative.   Psychiatric/Behavioral: The patient is nervous/anxious.   All other systems reviewed and are negative.    PHYSICAL EXAM: VS:  BP 118/78 (BP Location: Left Arm, Patient Position: Sitting, Cuff Size: Normal)   Pulse 68   Ht 5\' 4"  (1.626 m)   Wt 178 lb 12 oz (81.1 kg)   BMI 30.68 kg/m  , BMI Body mass index is 30.68 kg/m. GEN: Well nourished, well developed, in  no acute distress, obese  HEENT: normal  Neck: no JVD, carotid bruits, or masses Cardiac: RRR; 1+ SEM RSB,  no rubs, or gallops,no edema  Respiratory:  clear to auscultation bilaterally, normal work of breathing GI: soft, nontender, nondistended, + BS MS: no deformity or atrophy  Skin: warm and dry, no rash Neuro:  Strength and sensation are intact Psych: euthymic mood, full affect    Recent Labs: 04/20/2017: ALT 26; BUN 17; Creatinine, Ser 0.70; Hemoglobin 13.9; Platelets 169; Potassium 4.3; Sodium 147; TSH 1.840    Lipid Panel Lab Results  Component Value Date   CHOL 145 04/20/2017   HDL 54 04/20/2017   LDLCALC 79 04/20/2017   TRIG 58 04/20/2017      Wt Readings from Last 3 Encounters:  06/14/17 178 lb 12 oz (81.1 kg)  06/14/17 178 lb 12 oz  (81.1 kg)  06/13/17 173 lb (78.5 kg)       ASSESSMENT AND PLAN:  Ascending aortic aneurysm ([place]) - Followed in [place] by CT surgery Could perform TEE to evaluate aortic valve once aorta reaches 5 cm (unclear if she has bicuspid or tricuspid, not well visualized on echo) Repeat imaging in 2019 Aorta has been stable for approximately 10 years  Palpitations -  No changes to her medications, pacemaker in place Seen by Dr. Caryl Comes today No significant symptoms from palpitations  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onic -  Managed by Dr. Caryl Comes, previous  generator change out  Orthostatic hypotension  improved with fluid hydration, frequent snacking Does not want medications or abdominal binder She has been reluctant to be on beta blockers or ARB Stable   AV block, 2nd degree History of pacemaker Scheduled to see Dr. Caryl Comes today   Total encounter time more than 25 minutes  Greater than 50% was spent in counseling and coordination of care with the patient  Disposition:   F/U  6 months   Orders Placed This Encounter  Procedures  . EKG 12-Lead     Signed, Esmond Plants, M.D., Ph.D. 06/14/2017  [place], [place]

## 2017-06-13 ENCOUNTER — Other Ambulatory Visit: Payer: Self-pay

## 2017-06-13 ENCOUNTER — Encounter
Admission: RE | Admit: 2017-06-13 | Discharge: 2017-06-13 | Disposition: A | Discharge status: Home / Self Care | Payer: BLUE CROSS/BLUE SHIELD | Source: Ambulatory Visit | Attending: Otolaryngology | Admitting: Otolaryngology

## 2017-06-13 ENCOUNTER — Telehealth: Payer: Self-pay | Admitting: Cardiovascular Disease

## 2017-06-13 HISTORY — DX: Reserved for concepts with insufficient information to code with codable children: IMO0002

## 2017-06-13 NOTE — Progress Notes (Addendum)
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   Referring physician-Dr. Carloyn Manner  Date of visit: 06/13/17   History of presenting illness- 1.  Patient is a 61 year old female who was seen by Dr. Pryor Ochoa for evaluation of right neck mass.  The mass has been present for or a year and has been growing gradually.  It was associated with some right ear pain but no fever.  Patient has been a never smoker.  Ultrasound on 05/30/2017 showed:Single enlarged lymph node in the right neck measures approximately 4.8 x 1.7 x 3.0 cm  2.  Patient underwent IR guided lymph node biopsy of the cervical lymph node which showed metastatic squamous cell carcinoma.  Staining for P 16 was positive and sox 10 was negative   3.  PET/CT scan on 06/09/2017 showed intensely hypermetabolic enlarged right level 2 cervical lymph node with an SUV of 11.1.  No clear site of primary malignancy within the head and neck.  Potential site of investigation would include right base of tongue.  There was asymmetric metabolic activity noted in that area with an SUV of 4.5.  Less suspicious region in the posterior glottis.  No evidence of distant metastatic disease.  4. Patient works as a Geophysicist/field seismologist at [employer].  Patient has had multiple allergies to medications in the past.  Reports that when she took steroids in the past it made her quite aggravated.  Her sister was hospitalized in a mental health facility after receiving a steroid course in the past.  ECOG PS- 0  Pain scale- 0   Review of systems- Review of Systems  Constitutional: Negative for  chills, fever, malaise/fatigue and weight loss.  HENT: Negative for congestion, ear discharge and nosebleeds.        Right neck mass  Eyes: Negative for blurred vision.  Respiratory: Negative for cough, hemoptysis, sputum production, shortness of breath and wheezing.   Cardiovascular: Negative for chest pain, palpitations, orthopnea and claudication.  Gastrointestinal: Negative for abdominal pain, blood in stool, constipation, diarrhea, heartburn, melena, nausea and vomiting.  Genitourinary: Negative for dysuria, flank pain, frequency, hematuria and urgency.  Musculoskeletal: Negative for back pain, joint pain and myalgias.  Skin: Negative for rash.  Neurological: Negative for dizziness, tingling, focal weakness, seizures, weakness and headaches.  Endo/Heme/Allergies: Does not bruise/bleed easily.  Psychiatric/Behavioral: Negative for depression and suicidal ideas. The patient does not have insomnia.     Allergies  Allergen Reactions  . Cephalosporins Swelling  . Phenylephrine-Guaifenesin Anaphylaxis    Night terrors  . Tape Rash  . Cefdinir Swelling       . Codeine Nausea And Vomiting  . Dexilant [Dexlansoprazole]     Pressure in head  . Entex Lq [Phenylephrine-Guaifenesin]     Night terrors  . Flagyl [Metronidazole] Swelling  . Guaifenesin & Derivatives Other (See Comments)    Night terrors  . Lactose Intolerance (Gi) Other (See Comments)    GI upset  . Naproxen Sodium     Itching/rash  . Other Other (See Comments)    Flu vaccine:  Weakness, dizziness, tia type symptoms.    . Oxycodone Nausea And Vomiting  And itching  . Penicillins     Rash/hives  . Succinylcholine Other (See Comments)    Trouble waking up  . Sulfonamide Derivatives     Rash/itching  . Tetracyclines & Related Other (See Comments)    Unkown  . Wheat Bran   . Lactobacillus Itching  . Pseudoephedrine Rash  . Sulfa Antibiotics Rash    Patient Active Problem List   Diagnosis Date Noted  .  Atrial fibrillation ([place]) 04/19/2017  . Problems with swallowing and mastication   . Gastritis   . TIA (transient ischemic attack) 05/02/2015  . Atypical chest pain 06/30/2014  . Malaise 09/13/2013  . Ascending aortic aneurysm ([place]) 10/23/2012  . Fatigue 10/23/2012  . Precordial pain 10/23/2012  . Facial numbness   . AV block, 2nd degree   . Expressive aphasia 06/11/2011  . Chest pain 06/11/2011  . Orthostatic hypotension 06/11/2011  . Palpitations 04/01/2011  . ABNORMAL ELECTROCARDIOGRAM 09/15/2009  . SYNCOPE AND COLLAPSE 08/20/2009  . DYSPNEA 08/20/2009  . 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onic 08/20/2009     Past Medical History:  Diagnosis Date  . AA (aortic aneurysm) ([place])    a. 04/2011 Echo: Ao Root: 4.1cm, Asc Ao 4.7cm.  . Anxiety   . AV block, 2nd degree    a. 05/2005 - s/p MDT Adapta ADDR01 Dual Chamber PPM  . Bicuspid aortic valve   . Chest pain    a. 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  . Depression   . Expressive aphasia    a. ongoing since 04/2011 - seen by neurology - ? TIA vs. Migraine  . Facial numbness    a. ongoing since 04/2011 - seen by neurology - ? TIA vs. Migraine  . GERD (gastroesophageal reflux disease)   . Low blood pressure   . Presence of permanent cardiac pacemaker   . Syncope    a. 04/2011 Echo: EF 55-65%, No RWMA, Gr 1 DD.     Past Surgical History:  Procedure Laterality Date  . ABDOMINAL WALL MESH  REMOVAL    . bladder tack    . cataract  2004  . CHOLECYSTECTOMY  2000  . DILATION AND CURETTAGE OF UTERUS    . EP IMPLANTABLE DEVICE N/A 06/04/2015   Procedure: PPM Generator Changeout;  Surgeon: Deboraha Sprang, MD;  Location: [facility];  Service: Cardiovascular;  Laterality: N/A;  . ESOPHAGOGASTRODUODENOSCOPY (EGD) WITH PROPOFOL N/A 12/18/2015   Procedure: ESOPHAGOGASTRODUODENOSCOPY (EGD) WITH gastric biopsy and dilation;  Surgeon: Lucilla Lame, MD;  Location: [facility];  Service: Endoscopy;  Laterality: N/A;  . PACEMAKER INSERTION  2006    Medtronic Adapta ADDR01  . TONSILLECTOMY    . VAGINAL HYSTERECTOMY  2013   mad/cope    Social History   Socioeconomic History  . Marital status: Married    Spouse name: Not on file  . Number of children: Not on file  . Years of education: Not on file  . Highest education level: Not on file  Social Needs  . Financial resource strain: Not on file  . Food insecurity - worry: Not on file  . Food insecurity - inability: Not on file  . Transportation needs - medical: Not on file  . Transportation needs - non-medical: Not on file  Occupational History  . Not on file  Tobacco Use  . Smoking status: Never Smoker  . Smokeless tobacco: Never Used  . Tobacco comment: tobacco use -no  Substance and Sexual Activity  . Alcohol use: No  . Drug use: No  .  Sexual activity: Yes    Birth control/protection: Surgical  Other Topics Concern  . Not on file  Social History Narrative   Lives in [place] with husband.  Works out regularly.  Works as a Geophysicist/field seismologist - owns own business.      Family History  Problem Relation Age of Onset  . COPD Mother        alive @ 95  . Stroke Father        died @ 41  . Breast cancer Maternal Aunt 70  . COPD Maternal Grandmother   . Ovarian cancer Neg Hx   . Diabetes Neg Hx      Current Outpatient Medications:  .  aspirin 81 MG tablet, Take 81 mg by mouth daily., Disp: , Rfl:  .  b complex vitamins tablet, Take 1 tablet by mouth daily., Disp: , Rfl:  .  Calcium 75 MG TABS, Take 1 tablet by mouth daily. , Disp: , Rfl:  .  CRANBERRY SOFT PO, Take 1 each by mouth daily., Disp: , Rfl:  .  DHEA 10 MG TABS, Take 10 mg by mouth every morning., Disp: 90 tablet, Rfl: 3 .  fluticasone (FLONASE SENSIMIST) 27.5 MCG/SPRAY nasal spray, , Disp: , Rfl:  .  ibuprofen (ADVIL,MOTRIN) 200 MG tablet, Takes 2-3 tablets as needed., Disp: , Rfl:  .  nitroGLYCERIN (NITROSTAT) 0.4 MG SL tablet, Place 1 tablet (0.4 mg total) under the tongue every 5 (five) minutes as  needed for chest pain., Disp: 25 tablet, Rfl: 3 .  NON FORMULARY, Vaginal tablet (estrogen and testosterone) 2-3 times weekly., Disp: , Rfl:  .  PRESCRIPTION MEDICATION, Place 1 tablet vaginally 2 (two) times a week. Tuesday and Saturdays E3 1mg  and Testosterone 1mg  compound, Disp: , Rfl:  .  sodium chloride (MURO 128) 5 % ophthalmic solution, Place 1 drop into both eyes daily., Disp: , Rfl:    Physical exam:  Vitals:   06/14/17 1002 06/14/17 1011  BP:  (!) 132/93  Pulse:  64  Resp:  15  Temp:  (!) 97.1 F (36.2 C)  TempSrc:  Tympanic  Weight: 178 lb (80.7 kg)    Physical Exam  Constitutional: She is oriented to person, place, and time and well-developed, well-nourished, and in no distress.  HENT:  Head: Normocephalic and atraumatic.  Palpable right neck adenopathy about 4 cm in size  Eyes: EOM are normal. Pupils are equal, round, and reactive to light.  Neck: Normal range of motion.  Cardiovascular: Normal rate, regular rhythm and normal heart sounds.  Pulmonary/Chest: Effort normal and breath sounds normal.  Abdominal: Soft. Bowel sounds are normal.  Neurological: She is alert and oriented to person, place, and time.  Skin: Skin is warm and dry.       CMP Latest Ref Rng & Units 04/20/2017  Glucose 65 - 99 mg/dL 88  BUN 8 - 27 mg/dL 17  Creatinine 0.57 - 1.00 mg/dL 0.70  Sodium 134 - 144 mmol/L 147(H)  Potassium 3.5 - 5.2 mmol/L 4.3  Chloride 96 - 106 mmol/L 107(H)  CO2 20 - 29 mmol/L 24  Calcium 8.7 - 10.3 mg/dL 9.3  Total Protein 6.0 - 8.5 g/dL 6.4  Total Bilirubin 0.0 - 1.2 mg/dL 1.0  Alkaline Phos 39 - 117 IU/L 92  AST 0 - 40 IU/L 18  ALT 0 - 32 IU/L 26   CBC Latest Ref Rng & Units 04/20/2017  WBC 3.4 - 10.8 x10E3/uL 7.4  Hemoglobin 11.1 - 15.9 g/dL  13.9  Hematocrit 34.0 - 46.6 % 42.1  Platelets 150 - 379 x10E3/uL 169    No images are attached to the encounter.  Nm Pet Image Initial (pi) Skull Base To Thigh  Result Date: 06/09/2017 CLINICAL DATA:   Initial treatment strategy for lymphadenopathy. EXAM: NUCLEAR MEDICINE PET SKULL BASE TO THIGH TECHNIQUE: 12.9 mCi F-18 FDG was injected intravenously. Full-ring PET imaging was performed from the skull base to thigh after the radiotracer. CT data was obtained and used for attenuation correction and anatomic localization. FASTING BLOOD GLUCOSE:  Value: 91 mg/dl COMPARISON:  None. FINDINGS: NECK The enlarged LEFT level 2 lymph node measuring 20 mm short axis which has intense metabolic activity SUV max equal 11.1. No clear primary malignancy identified within the oropharynx or hypopharynx. Mild asymmetric metabolic activity at RIGHT base of tongue with SUV max equal 4.5. Mile focal activity associated with posterior aspect of the glottis with SUV max equal 4.0. No mass lesion identified (image 245 ). CHEST No hypermetabolic mediastinal or hilar nodes. No suspicious pulmonary nodules on the CT scan. ABDOMEN/PELVIS No abnormal hypermetabolic activity within the liver, pancreas, adrenal glands, or spleen. No hypermetabolic lymph nodes in the abdomen or pelvis. Bilateral benign adrenal adenomas post hysterectomy. Sigmoid diverticulosis SKELETON No focal hypermetabolic activity to suggest skeletal metastasis. IMPRESSION: 1. Intensely hypermetabolic enlarged lymph node in the RIGHT level II nodal station consistent with metastatic squamous cell carcinoma. 2. No clear site of primary malignancy within the head neck. Potential site of investigation would include the RIGHT base of tongue and a less suspicious region in the posterior glottis. 3. No evidence distant metastatic disease Electronically Signed   By: Suzy Bouchard M.D.   On: 06/09/2017 15:49   Mm Screening Breast Tomo Bilateral  Result Date: 05/23/2017 CLINICAL DATA:  Screening. EXAM: 2D DIGITAL SCREENING BILATERAL MAMMOGRAM WITH CAD AND ADJUNCT TOMO COMPARISON:  Previous exam(s). ACR Breast Density Category a: The breast tissue is almost entirely fatty.  FINDINGS: There are no findings suspicious for malignancy. Images were processed with CAD. IMPRESSION: No mammographic evidence of malignancy. A result letter of this screening mammogram will be mailed directly to the patient. RECOMMENDATION: Screening mammogram in one year. (Code:SM-B-01Y) BI-RADS CATEGORY  1: Negative. Electronically Signed   By: Franki Cabot M.D.   On: 05/23/2017 10:46   [place] Core Biopsy (lymph Nodes)  Result Date: 05/30/2017 CLINICAL DATA:  Enlarged right cervical lymph node. EXAM: ULTRASOUND GUIDED CORE BIOPSY OF RIGHT CERVICAL LYMPH NODE MEDICATIONS: 2.0 mg IV Versed; 100 mcg IV Fentanyl Total Moderate Sedation Time: 15 minutes. The patient's level of consciousness and physiologic status were continuously monitored during the procedure by Radiology nursing. PROCEDURE: The procedure, risks, benefits, and alternatives were explained to the patient. Questions regarding the procedure were encouraged and answered. The patient understands and consents to the procedure. A time out was performed prior to initiating the procedure. The right neck was prepped with chlorhexidine in a sterile fashion, and a sterile drape was applied covering the operative field. A sterile gown and sterile gloves were used for the procedure. Local anesthesia was provided with 1% Lidocaine. Ultrasound was utilized to localize an enlarged right cervical lymph node. Under direct ultrasound guidance, 6 separate 18 gauge core biopsy samples were obtained through the lymph node with material submitted on a saline soaked Telfa pad. COMPLICATIONS: None. FINDINGS: Single enlarged lymph node in the right neck measures approximately 4.8 x 1.7 x 3.0 cm. Multiple samples were obtained from different portions of the lymph  node. IMPRESSION: Ultrasound-guided core biopsy performed of an enlarged right cervical lymph node. Electronically Signed   By: Aletta Edouard M.D.   On: 05/30/2017 15:09    Assessment and plan- Patient is a 61  y.o. female with newly diagnosed squamous cell carcinoma of the head and neck likely oropharynx base of the tongue HPV positive stage I TX N1 M0  I discussed the results of the ultrasound PET/CT scan and pathology with the patient in detail.  Biopsy of the right cervical lymph node showed squamous cell carcinoma and given that this is P 16+, this is consistent with HPV positive squamous cell of the head and neck.  PET/CT 6 findings are suggestive of right tongue base being the primary site.  Patient is seeing Dr. Pryor Ochoa this week for a dedicated right tongue base biopsy.  If this biopsy is negative he plans to take random biopsies to a certain the primary site.  Based on the assumption that this is  Squamous cell carcinoma of the oropharynx standard of care for a lymph node greater than 3 cm would either be-concurrent chemoradiation with weekly cisplatin OR TORS with neck dissection.  Patient is very anxious about receiving chemotherapy given her history of multiple allergies in the past and the prospect of receiving steroids along with chemotherapy.    I discussed with the patient that if she were to choose a surgical option, that still may not obviate the need for adjuvant chemotherapy and radiation.  There is a risk of extracapsular extension especially given the large size of her lymph node.  If that were to be found then she would need adjuvant chemotherapy and radiation despite going through surgery.  HPV positive oropharyngeal cancers typically have good outcomes with concurrent chemoradiation and I would therefore recommend that over surgery.  Patient would need chemotherapy along with radiation as her lymph node is greater than 3 cm in size.    Discussed that I would plan to give her cisplatin 40 mg/m weekly concurrently with radiation which would be typically for 7 weeks.  Patient will be meeting Dr. Donella Stade next week to discuss radiation therapy.  I discussed risks and benefits of chemotherapy  including all but not limited to nausea, vomiting, fatigue, peripheral neuropathy, kidney dysfunction and hearing loss associated with cisplatin.  Risk of dysphagia, odynophagia and mucositis associated with concurrent chemoradiation.  Need for port placement prior to chemotherapy as well as baseline audiology testing.  Patient understands and is leaning towards chemoradiation at this time.  Final treatment would depend upon biopsies to be done later this week.  Chemotherapy will begin given with a curative intent.  I will plan to give her a lower dose of steroids as premedication to see how she tolerates it with the first cycle.  Discussed the possibility of significant weight loss during chemoradiation if she were to have mucositis and difficulty swallowing.  I will hold off on a prophylactic PEG tube at this time.  I will refer her to [facility] Grays for nutrition counseling  I will tentatively see her in 2 weeks time with a CBC and CMP with plans to start first cycle of cisplatin on that day.  I will be in touch with Dr. Pryor Ochoa after the biopsy.   Thank you for this kind referral and the opportunity to participate in the care of this patient   Total face to face encounter time for this patient visit was 45 min. >50% of the time was  spent in counseling  and coordination of care.     Visit Diagnosis 1. Squamous cell carcinoma of head and neck (HCC)   2. Goals of care, counseling/discussion     Dr. Randa Evens, MD, MPH [facility] Pager- [phone] 06/14/2017 1:18 PM  Addendum: I spoke to Dr. Pryor Ochoa today.  Base of tongue biopsy is positive for squamous cell carcinoma.  I will therefore proceed with definitive concurrent chemoradiation for SCC of the oropharynx as planned.  We will plan to get port placement.  Baseline audiogram prior to chemotherapy and chemotherapy teach for weekly cisplatin tentatively to start in 2 weeks time  Dr. Randa Evens, MD, MPH [facility] at  [facility] [phone] 06/16/2017 4:29 PM

## 2017-06-13 NOTE — Patient Instructions (Signed)
Your procedure is scheduled on: Thursday, June 16, 2017 Report to Same Day Surgery on the 2nd floor in the [facility]. To find out your arrival time, please call [phone] between 1PM - 3PM on: Wednesday, June 15, 2017  REMEMBER: Instructions that are not followed completely may result in serious medical risk, up to and including death; or upon the discretion of your surgeon and anesthesiologist your surgery may need to be rescheduled.  Do not eat food after midnight the night before your procedure.  No gum chewing or hard candies.  You may however, drink CLEAR liquids up to 2 hours before you are scheduled to arrive at the hospital for your procedure.  Do not drink clear liquids within 2 hours of the start of your surgery.  Clear liquids include: - water  - apple juice without pulp - clear gatorade - black coffee or tea (Do NOT add anything to the coffee or tea) Do NOT drink anything that is not on this list.  No Alcohol for 24 hours before or after surgery.  No Smoking including e-cigarettes for 24 hours prior to surgery. No chewable tobacco products for at least 6 hours prior to surgery. No nicotine patches on the day of surgery.  Notify your doctor if there is any change in your medical condition (cold, fever, infection).  Do not wear jewelry, make-up, hairpins, clips or nail polish.  Do not wear lotions, powders, or perfumes. You may wear deodorant.  Do not shave 48 hours prior to surgery. Men may shave face and neck.  Contacts and dentures may not be worn into surgery.  Do not bring valuables to the hospital. [facility] is not responsible for any belongings or valuables.   TAKE THESE MEDICATIONS THE MORNING OF SURGERY WITH A SIP OF WATER:  none  Follow recommendations from Cardiologist regarding stopping Aspirin.  Stop Anti-inflammatories such as Advil, Aleve, Ibuprofen, Motrin, Naproxen, Naprosyn, Goodie powder, or aspirin products. (May take Tylenol or  Acetaminophen if needed.)  Stop ANY OVER THE COUNTER supplements until after surgery. (cranberry, DHEA, digestive enzyme, Bone up) (May continue Vitamin B.)  If you are being discharged the day of surgery, you will not be allowed to drive home. You will need someone to drive you home and stay with you that night.   If you are taking public transportation, you will need to have a responsible adult to with you.  Please call the number above if you have any questions about these instructions.

## 2017-06-13 NOTE — Telephone Encounter (Signed)
Acceptable risk for procedure No further workup needed

## 2017-06-13 NOTE — Telephone Encounter (Signed)
° °  [facility] Pre-operative Risk Assessment    Request for surgical clearance:  1. What type of surgery is being performed?laryngoscopy with biopsy of base of tongue  2. When is this surgery scheduled? 06/16/17  3. Are there any medications that need to be held prior to surgery and how long?unknown  Practice name and name of physician performing surgery? Dr. Pryor Ochoa [facility] 4. What is your office phone and fax number? Pre Admit testing [place] Fax [phone]  5. Anesthesia type (None, local, MAC, general) ? unknown    _________________________________________________________________   (provider comments below)

## 2017-06-14 ENCOUNTER — Encounter: Payer: Self-pay | Admitting: Internal Medicine

## 2017-06-14 ENCOUNTER — Ambulatory Visit (INDEPENDENT_AMBULATORY_CARE_PROVIDER_SITE_OTHER): Payer: BLUE CROSS/BLUE SHIELD | Admitting: Cardiovascular Disease

## 2017-06-14 ENCOUNTER — Inpatient Hospital Stay: Payer: BLUE CROSS/BLUE SHIELD | Attending: Hematology and Oncology | Admitting: Oncology

## 2017-06-14 ENCOUNTER — Encounter: Payer: Self-pay | Admitting: Oncology

## 2017-06-14 ENCOUNTER — Ambulatory Visit (INDEPENDENT_AMBULATORY_CARE_PROVIDER_SITE_OTHER): Payer: BLUE CROSS/BLUE SHIELD | Admitting: Internal Medicine

## 2017-06-14 ENCOUNTER — Encounter: Payer: Self-pay | Admitting: Cardiovascular Disease

## 2017-06-14 ENCOUNTER — Other Ambulatory Visit: Payer: Self-pay

## 2017-06-14 VITALS — BP 118/78 | HR 68 | Ht 64.0 in | Wt 178.8 lb

## 2017-06-14 VITALS — BP 132/93 | HR 64 | Temp 97.1°F | Resp 15 | Wt 178.0 lb

## 2017-06-14 DIAGNOSIS — Z5111 Encounter for antineoplastic chemotherapy: Secondary | ICD-10-CM | POA: Insufficient documentation

## 2017-06-14 DIAGNOSIS — Z90710 Acquired absence of both cervix and uterus: Secondary | ICD-10-CM

## 2017-06-14 DIAGNOSIS — Z950 Presence of cardiac pacemaker: Secondary | ICD-10-CM

## 2017-06-14 DIAGNOSIS — K5730 Diverticulosis of large intestine without perforation or abscess without bleeding: Secondary | ICD-10-CM

## 2017-06-14 DIAGNOSIS — Q231 Congenital insufficiency of aortic valve: Secondary | ICD-10-CM | POA: Insufficient documentation

## 2017-06-14 DIAGNOSIS — I712 Thoracic aortic aneurysm, without rupture: Secondary | ICD-10-CM

## 2017-06-14 DIAGNOSIS — K219 Gastro-esophageal reflux disease without esophagitis: Secondary | ICD-10-CM | POA: Diagnosis not present

## 2017-06-14 DIAGNOSIS — G459 Transient cerebral ischemic attack, unspecified: Secondary | ICD-10-CM

## 2017-06-14 DIAGNOSIS — I480 Paroxysmal atrial fibrillation: Secondary | ICD-10-CM

## 2017-06-14 DIAGNOSIS — F329 Major depressive disorder, single episode, unspecified: Secondary | ICD-10-CM | POA: Insufficient documentation

## 2017-06-14 DIAGNOSIS — R5383 Other fatigue: Secondary | ICD-10-CM

## 2017-06-14 DIAGNOSIS — R0609 Other forms of dyspnea: Secondary | ICD-10-CM

## 2017-06-14 DIAGNOSIS — I951 Orthostatic hypotension: Secondary | ICD-10-CM | POA: Diagnosis not present

## 2017-06-14 DIAGNOSIS — C770 Secondary and unspecified malignant neoplasm of lymph nodes of head, face and neck: Secondary | ICD-10-CM | POA: Diagnosis not present

## 2017-06-14 DIAGNOSIS — F419 Anxiety disorder, unspecified: Secondary | ICD-10-CM | POA: Insufficient documentation

## 2017-06-14 DIAGNOSIS — Z79899 Other long term (current) drug therapy: Secondary | ICD-10-CM | POA: Diagnosis not present

## 2017-06-14 DIAGNOSIS — R4701 Aphasia: Secondary | ICD-10-CM | POA: Diagnosis not present

## 2017-06-14 DIAGNOSIS — I4891 Unspecified atrial fibrillation: Secondary | ICD-10-CM | POA: Diagnosis not present

## 2017-06-14 DIAGNOSIS — H9201 Otalgia, right ear: Secondary | ICD-10-CM

## 2017-06-14 DIAGNOSIS — Z7982 Long term (current) use of aspirin: Secondary | ICD-10-CM | POA: Diagnosis not present

## 2017-06-14 DIAGNOSIS — D3500 Benign neoplasm of unspecified adrenal gland: Secondary | ICD-10-CM | POA: Diagnosis not present

## 2017-06-14 DIAGNOSIS — C760 Malignant neoplasm of head, face and neck: Secondary | ICD-10-CM

## 2017-06-14 DIAGNOSIS — C01 Malignant neoplasm of base of tongue: Secondary | ICD-10-CM | POA: Diagnosis not present

## 2017-06-14 DIAGNOSIS — I441 Atrioventricular block, second degree: Secondary | ICD-10-CM | POA: Diagnosis not present

## 2017-06-14 DIAGNOSIS — R002 Palpitations: Secondary | ICD-10-CM | POA: Insufficient documentation

## 2017-06-14 DIAGNOSIS — R590 Localized enlarged lymph nodes: Secondary | ICD-10-CM

## 2017-06-14 DIAGNOSIS — Z01818 Encounter for other preprocedural examination: Secondary | ICD-10-CM

## 2017-06-14 DIAGNOSIS — I7121 Aneurysm of the ascending aorta, without rupture: Secondary | ICD-10-CM

## 2017-06-14 DIAGNOSIS — R0789 Other chest pain: Secondary | ICD-10-CM

## 2017-06-14 DIAGNOSIS — Z8673 Personal history of transient ischemic attack (TIA), and cerebral infarction without residual deficits: Secondary | ICD-10-CM | POA: Diagnosis not present

## 2017-06-14 DIAGNOSIS — Z8619 Personal history of other infectious and parasitic diseases: Secondary | ICD-10-CM | POA: Diagnosis not present

## 2017-06-14 DIAGNOSIS — R079 Chest pain, unspecified: Secondary | ICD-10-CM | POA: Diagnosis not present

## 2017-06-14 DIAGNOSIS — C109 Malignant neoplasm of oropharynx, unspecified: Secondary | ICD-10-CM | POA: Insufficient documentation

## 2017-06-14 DIAGNOSIS — Z7189 Other specified counseling: Secondary | ICD-10-CM

## 2017-06-14 NOTE — Patient Instructions (Signed)
Medication Instructions:  Your physician recommends that you continue on your current medications as directed. Please refer to the Current Medication list given to you today.   Labwork: none  Testing/Procedures: Remote monitoring is used to monitor your Pacemaker of ICD from home. This monitoring reduces the number of office visits required to check your device to one time per year. It allows [place] to keep an eye on the functioning of your device to ensure it is working properly. You are scheduled for a device check from home on March 2019. You may send your transmission at any time that day. If you have a wireless device, the transmission will be sent automatically. After your physician reviews your transmission, you will receive a postcard with your next transmission date.    Follow-Up: Your physician wants you to follow-up in: 1 year with Dr. Caryl Comes. You will receive a reminder letter in the mail two months in advance. If you don't receive a letter, please call our office to schedule the follow-up appointment.   Any Other Special Instructions Will Be Listed Below (If Applicable).     If you need a refill on your cardiac medications before your next appointment, please call your pharmacy.

## 2017-06-14 NOTE — Progress Notes (Signed)
skf      Patient Care Team: Valerie Roys, DO as PCP - General (Family Medicine) Rockey Situ Kathlene November, MD as Consulting Physician (Cardiology)   HPI  Wendy Kelly is a 61 y.o. female Seen in followup for a pacemaker implanted for syncope with intermittent second degree heart block. She received a Medtronic adapta device in 2006.    Her device reverted to ERI 10/16 and she underwent generator replacement 11/16.      Hx of TIA,  Orthostatic intolerance       DATE TEST    12/15 echo   EF50-55  mod ascending aortic dilitation 36 mm  1/16 myoview   EF 40-"normal"  no ischemia  12/15 CTA 4.6 cm  rec semi annual evaluation   1/18 Echo Ef 55-60    6/18 CTA 4.8 rec  Semi annual      Diagnosed w malignancy of throat area-- evaluation on going  Overwhelmed   Past Medical History:  Diagnosis Date  . AA (aortic aneurysm) ([place])    a. 04/2011 Echo: Ao Root: 4.1cm, Asc Ao 4.7cm.  . Anxiety   . AV block, 2nd degree    a. 05/2005 - s/p MDT Adapta ADDR01 Dual Chamber PPM  . Bicuspid aortic valve   . Chest pain    a. 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  . Depression   . Expressive aphasia    a. ongoing since 04/2011 - seen by neurology - ? TIA vs. Migraine  . Facial numbness    a. ongoing since 04/2011 - seen by neurology - ? TIA vs. Migraine  . GERD (gastroesophageal reflux disease)   . Low blood pressure   . Presence of permanent cardiac pacemaker   . 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 Syncope    a. 04/2011 Echo: EF 55-65%, No RWMA, Gr 1 DD.    Past Surgical History:  Procedure Laterality Date  . ABDOMINAL WALL MESH  REMOVAL    . bladder tack    . cataract Bilateral 2004  . CHOLECYSTECTOMY  2000  . DILATION AND CURETTAGE OF UTERUS    . EP IMPLANTABLE DEVICE N/A 06/04/2015   Procedure: PPM Generator Changeout;  Surgeon: Deboraha Sprang, MD;  Location: [facility];  Service: Cardiovascular;  Laterality: N/A;  . ESOPHAGOGASTRODUODENOSCOPY (EGD)  WITH PROPOFOL N/A 12/18/2015   Procedure: ESOPHAGOGASTRODUODENOSCOPY (EGD) WITH gastric biopsy and dilation;  Surgeon: Lucilla Lame, MD;  Location: [facility];  Service: Endoscopy;  Laterality: N/A;  . EYE SURGERY    . INSERT / REPLACE / REMOVE PACEMAKER    . PACEMAKER INSERTION  2006   Medtronic Adapta ADDR01  . TONSILLECTOMY    . TONSILLECTOMY    . VAGINAL HYSTERECTOMY  2013   mad/cope    Current Outpatient Medications  Medication Sig Dispense Refill  . aspirin 81 MG tablet Take 81 mg by mouth daily.    Marland Kitchen b complex vitamins tablet Take 1 tablet by mouth daily.    Marland Kitchen CRANBERRY SOFT PO Take 2 each by mouth daily.     Marland Kitchen DHEA 10 MG TABS Take 10 mg by mouth every morning. 90 tablet 3  . Digestive Enzymes (DIGESTIVE ENZYME PO) Take 1-2 capsules by mouth daily.    . fluticasone (FLONASE SENSIMIST) 27.5 MCG/SPRAY nasal spray Place 1 spray into the nose daily.     Marland Kitchen ibuprofen (ADVIL,MOTRIN) 200 MG tablet Takes 2-3 tablets as needed.    . nitroGLYCERIN (NITROSTAT)  0.4 MG SL tablet Place 1 tablet (0.4 mg total) under the tongue every 5 (five) minutes as needed for chest pain. 25 tablet 3  . NON FORMULARY Take by mouth daily. Bone Up (calcium)     . PRESCRIPTION MEDICATION Place 1 tablet vaginally 3 (three) times a week. E3 1mg  and Testosterone 1mg  compound    . sodium chloride (MURO 128) 5 % ophthalmic solution Place 1 drop into both eyes daily.     No current facility-administered medications for this visit.     Allergies  Allergen Reactions  . Cephalosporins Swelling  . Phenylephrine-Guaifenesin Anaphylaxis    Night terrors  . Tape Rash  . Cefdinir Swelling       . Codeine Nausea And Vomiting  . Dairy Aid [Lactase] Swelling    Milk and milk products  . Dexilant [Dexlansoprazole]     Pressure in head  . Entex Lq [Phenylephrine-Guaifenesin]     Night terrors  . Flagyl [Metronidazole] Swelling  . Guaifenesin & Derivatives Other (See Comments)    Night terrors  . Lactose  Intolerance (Gi) Other (See Comments)    GI upset  . Naproxen Sodium     Itching/rash  . Other Other (See Comments)    Flu vaccine:  Weakness, dizziness, tia type symptoms.    . Oxycodone Nausea And Vomiting    And itching  . Penicillins     Rash/hives  . Succinylcholine Other (See Comments)    Trouble waking up  . Sulfonamide Derivatives     Rash/itching  . Tetracyclines & Related Other (See Comments)    Was taking flagyl at the same time; unsure which caused the swelling   . Wheat Bran   . Lactobacillus Itching  . Pseudoephedrine Rash  . Sulfa Antibiotics Rash    Review of Systems negative except from HPI and PMH  Physical Exam BP 118/78 (BP Location: Left Arm, Patient Position: Sitting, Cuff Size: Normal)   Pulse 68   Ht 5\' 4"  (1.626 m)   Wt 178 lb 12 oz (81.1 kg)   BMI 30.68 kg/m  Well developed and nourished tearful HENT normal Neck supple  Clear Regular rate and rhythm, no murmurs or gallops Abd-soft with active BS No Clubbing cyanosis edema Skin-warm and dry A & Oriented  Grossly normal sensory and motor function    Assessment and  Plan  Syncope  Intermittent second degree heart block-   Pacemaker-Medtronic  The patient's device was interrogated.  The information was reviewed. No changes were made in the programming.    Hypotension   Ascending aortic aneurysm  TIA   Preoperative Device consult   Cancer ? Squamous primary not yet known but most metabolic activity is right sided     Pacemaker function is normal--as it is left sided, no immediate issues, even if XRT requuired  During surgery, magnet should be available but her Ventricular pacing is only about 0.3% so unlikely that there will be a concern wi bovie  No interval syncope  With proclivity towards low BP, I would be very concerned about her ability to stay hydrated-- she may need intentional IV fluid support

## 2017-06-14 NOTE — Patient Instructions (Signed)

## 2017-06-14 NOTE — Progress Notes (Signed)
Patient here for initial consult for newly diagnosed head and neck cancer. She is very anxious and fearful about her new diagnosis.

## 2017-06-14 NOTE — Telephone Encounter (Signed)
Clearance routed to number provided via Epic.  

## 2017-06-16 ENCOUNTER — Telehealth: Payer: Self-pay | Admitting: *Deleted

## 2017-06-16 ENCOUNTER — Ambulatory Visit: Payer: BLUE CROSS/BLUE SHIELD | Admitting: Certified Registered"

## 2017-06-16 ENCOUNTER — Encounter: Payer: Self-pay | Admitting: *Deleted

## 2017-06-16 ENCOUNTER — Ambulatory Visit
Admission: RE | Admit: 2017-06-16 | Discharge: 2017-06-16 | Disposition: A | Discharge status: Home / Self Care | Payer: BLUE CROSS/BLUE SHIELD | Source: Ambulatory Visit | Attending: Otolaryngology | Admitting: Otolaryngology

## 2017-06-16 ENCOUNTER — Encounter
Admission: RE | Disposition: A | Discharge status: Home / Self Care | Payer: Self-pay | Source: Ambulatory Visit | Attending: Otolaryngology

## 2017-06-16 DIAGNOSIS — C770 Secondary and unspecified malignant neoplasm of lymph nodes of head, face and neck: Secondary | ICD-10-CM | POA: Diagnosis not present

## 2017-06-16 DIAGNOSIS — I714 Abdominal aortic aneurysm, without rupture: Secondary | ICD-10-CM | POA: Insufficient documentation

## 2017-06-16 DIAGNOSIS — F418 Other specified anxiety disorders: Secondary | ICD-10-CM | POA: Diagnosis not present

## 2017-06-16 DIAGNOSIS — I739 Peripheral vascular disease, unspecified: Secondary | ICD-10-CM | POA: Insufficient documentation

## 2017-06-16 DIAGNOSIS — F419 Anxiety disorder, unspecified: Secondary | ICD-10-CM | POA: Diagnosis not present

## 2017-06-16 DIAGNOSIS — Z7982 Long term (current) use of aspirin: Secondary | ICD-10-CM | POA: Insufficient documentation

## 2017-06-16 DIAGNOSIS — F329 Major depressive disorder, single episode, unspecified: Secondary | ICD-10-CM | POA: Insufficient documentation

## 2017-06-16 DIAGNOSIS — R490 Dysphonia: Secondary | ICD-10-CM | POA: Diagnosis not present

## 2017-06-16 DIAGNOSIS — Z950 Presence of cardiac pacemaker: Secondary | ICD-10-CM | POA: Diagnosis not present

## 2017-06-16 DIAGNOSIS — C029 Malignant neoplasm of tongue, unspecified: Secondary | ICD-10-CM | POA: Diagnosis not present

## 2017-06-16 DIAGNOSIS — Z79899 Other long term (current) drug therapy: Secondary | ICD-10-CM | POA: Diagnosis not present

## 2017-06-16 DIAGNOSIS — R220 Localized swelling, mass and lump, head: Secondary | ICD-10-CM | POA: Diagnosis not present

## 2017-06-16 DIAGNOSIS — R87810 Cervical high risk human papillomavirus (HPV) DNA test positive: Secondary | ICD-10-CM | POA: Insufficient documentation

## 2017-06-16 DIAGNOSIS — C109 Malignant neoplasm of oropharynx, unspecified: Secondary | ICD-10-CM | POA: Diagnosis not present

## 2017-06-16 DIAGNOSIS — C01 Malignant neoplasm of base of tongue: Secondary | ICD-10-CM | POA: Diagnosis not present

## 2017-06-16 DIAGNOSIS — K219 Gastro-esophageal reflux disease without esophagitis: Secondary | ICD-10-CM | POA: Insufficient documentation

## 2017-06-16 DIAGNOSIS — Z7951 Long term (current) use of inhaled steroids: Secondary | ICD-10-CM | POA: Diagnosis not present

## 2017-06-16 DIAGNOSIS — I4891 Unspecified atrial fibrillation: Secondary | ICD-10-CM | POA: Diagnosis not present

## 2017-06-16 HISTORY — PX: DIRECT LARYNGOSCOPY: SHX5326

## 2017-06-16 SURGERY — LARYNGOSCOPY, DIRECT
Anesthesia: General | Laterality: Right | Wound class: Clean Contaminated

## 2017-06-16 MED ORDER — LIDOCAINE-PRILOCAINE 2.5-2.5 % EX CREA: TOPICAL_CREAM | CUTANEOUS | 3 refills | Status: DC

## 2017-06-16 MED ORDER — LIDOCAINE HCL (PF) 2 % IJ SOLN
INTRAMUSCULAR | Status: AC
  Filled 2017-06-16: qty 10

## 2017-06-16 MED ORDER — PHENYLEPHRINE HCL 10 MG/ML IJ SOLN
INTRAMUSCULAR | Status: DC | PRN
  Administered 2017-06-16: 50 ug via INTRAVENOUS

## 2017-06-16 MED ORDER — SUGAMMADEX SODIUM 200 MG/2ML IV SOLN
INTRAVENOUS | Status: DC | PRN
  Administered 2017-06-16: 160 mg via INTRAVENOUS

## 2017-06-16 MED ORDER — HYDROCODONE-ACETAMINOPHEN 7.5-325 MG/15ML PO SOLN: 10.0000 mL | Freq: Four times a day (QID) | ORAL | 0 refills | Status: DC | PRN

## 2017-06-16 MED ORDER — LORAZEPAM 0.5 MG PO TABS: 0.5000 mg | ORAL_TABLET | Freq: Four times a day (QID) | ORAL | 0 refills | Status: DC | PRN

## 2017-06-16 MED ORDER — MIDAZOLAM HCL 2 MG/2ML IJ SOLN
INTRAMUSCULAR | Status: DC | PRN
  Administered 2017-06-16: 2 mg via INTRAVENOUS

## 2017-06-16 MED ORDER — DEXAMETHASONE 4 MG PO TABS: ORAL_TABLET | ORAL | 1 refills | Status: DC

## 2017-06-16 MED ORDER — ONDANSETRON HCL 8 MG PO TABS: 8.0000 mg | ORAL_TABLET | Freq: Two times a day (BID) | ORAL | 1 refills | Status: DC | PRN

## 2017-06-16 MED ORDER — DEXAMETHASONE SODIUM PHOSPHATE 10 MG/ML IJ SOLN
INTRAMUSCULAR | Status: AC
  Filled 2017-06-16: qty 1

## 2017-06-16 MED ORDER — DIPHENHYDRAMINE HCL 50 MG/ML IJ SOLN
INTRAMUSCULAR | Status: DC | PRN
  Administered 2017-06-16: 12.5 mg via INTRAVENOUS

## 2017-06-16 MED ORDER — PROCHLORPERAZINE MALEATE 10 MG PO TABS: 10.0000 mg | ORAL_TABLET | Freq: Four times a day (QID) | ORAL | 1 refills | Status: DC | PRN

## 2017-06-16 MED ORDER — FENTANYL CITRATE (PF) 100 MCG/2ML IJ SOLN
INTRAMUSCULAR | Status: AC
  Administered 2017-06-16: 25 ug via INTRAVENOUS
  Filled 2017-06-16: qty 2

## 2017-06-16 MED ORDER — LIDOCAINE HCL 4 % MT SOLN
OROMUCOSAL | Status: DC | PRN
  Administered 2017-06-16: 2 mL via TOPICAL

## 2017-06-16 MED ORDER — MIDAZOLAM HCL 2 MG/2ML IJ SOLN
INTRAMUSCULAR | Status: AC
  Filled 2017-06-16: qty 2

## 2017-06-16 MED ORDER — PROPOFOL 10 MG/ML IV BOLUS
INTRAVENOUS | Status: DC | PRN
  Administered 2017-06-16: 20 mg via INTRAVENOUS
  Administered 2017-06-16: 150 mg via INTRAVENOUS

## 2017-06-16 MED ORDER — FENTANYL CITRATE (PF) 100 MCG/2ML IJ SOLN
INTRAMUSCULAR | Status: DC | PRN
  Administered 2017-06-16 (×2): 50 ug via INTRAVENOUS
  Administered 2017-06-16: 100 ug via INTRAVENOUS

## 2017-06-16 MED ORDER — FENTANYL CITRATE (PF) 100 MCG/2ML IJ SOLN
INTRAMUSCULAR | Status: AC
  Filled 2017-06-16: qty 2

## 2017-06-16 MED ORDER — FAMOTIDINE 20 MG PO TABS
ORAL_TABLET | ORAL | Status: AC
  Administered 2017-06-16: 20 mg via ORAL
  Filled 2017-06-16: qty 1

## 2017-06-16 MED ORDER — ONDANSETRON HCL 4 MG/2ML IJ SOLN: 4.0000 mg | Freq: Once | INTRAMUSCULAR | Status: DC | PRN

## 2017-06-16 MED ORDER — LIDOCAINE HCL (PF) 4 % IJ SOLN
INTRAMUSCULAR | Status: AC
  Filled 2017-06-16: qty 5

## 2017-06-16 MED ORDER — ONDANSETRON HCL 4 MG/2ML IJ SOLN
INTRAMUSCULAR | Status: DC | PRN
  Administered 2017-06-16: 4 mg via INTRAVENOUS

## 2017-06-16 MED ORDER — ONDANSETRON HCL 4 MG PO TABS: 4.0000 mg | ORAL_TABLET | Freq: Three times a day (TID) | ORAL | 0 refills | Status: DC | PRN

## 2017-06-16 MED ORDER — FENTANYL CITRATE (PF) 100 MCG/2ML IJ SOLN
25.0000 ug | INTRAMUSCULAR | Status: DC | PRN
  Administered 2017-06-16 (×4): 25 ug via INTRAVENOUS

## 2017-06-16 MED ORDER — DEXAMETHASONE SODIUM PHOSPHATE 10 MG/ML IJ SOLN
INTRAMUSCULAR | Status: DC | PRN
  Administered 2017-06-16: 8 mg via INTRAVENOUS

## 2017-06-16 MED ORDER — LACTATED RINGERS IV SOLN
INTRAVENOUS | Status: DC
  Administered 2017-06-16: 50 mL/h via INTRAVENOUS

## 2017-06-16 MED ORDER — OXYMETAZOLINE HCL 0.05 % NA SOLN
NASAL | Status: DC | PRN
  Administered 2017-06-16: 1

## 2017-06-16 MED ORDER — SUGAMMADEX SODIUM 200 MG/2ML IV SOLN
INTRAVENOUS | Status: AC
  Filled 2017-06-16: qty 2

## 2017-06-16 MED ORDER — LIDOCAINE HCL (CARDIAC) 20 MG/ML IV SOLN
INTRAVENOUS | Status: DC | PRN
  Administered 2017-06-16: 50 mg via INTRAVENOUS

## 2017-06-16 MED ORDER — EPINEPHRINE PF 1 MG/ML IJ SOLN
INTRAMUSCULAR | Status: DC | PRN
  Administered 2017-06-16: 1 mg

## 2017-06-16 MED ORDER — FAMOTIDINE 20 MG PO TABS
20.0000 mg | ORAL_TABLET | Freq: Once | ORAL | Status: AC
  Administered 2017-06-16: 20 mg via ORAL

## 2017-06-16 MED ORDER — ONDANSETRON HCL 4 MG/2ML IJ SOLN
INTRAMUSCULAR | Status: AC
  Filled 2017-06-16: qty 2

## 2017-06-16 MED ORDER — ROCURONIUM BROMIDE 100 MG/10ML IV SOLN
INTRAVENOUS | Status: DC | PRN
  Administered 2017-06-16: 30 mg via INTRAVENOUS
  Administered 2017-06-16: 10 mg via INTRAVENOUS

## 2017-06-16 MED ORDER — EPINEPHRINE PF 1 MG/ML IJ SOLN
INTRAMUSCULAR | Status: AC
  Filled 2017-06-16: qty 1

## 2017-06-16 MED ORDER — PROPOFOL 10 MG/ML IV BOLUS
INTRAVENOUS | Status: AC
  Filled 2017-06-16: qty 20

## 2017-06-16 MED ORDER — OXYMETAZOLINE HCL 0.05 % NA SOLN
NASAL | Status: AC
  Filled 2017-06-16: qty 15

## 2017-06-16 MED ORDER — ROCURONIUM BROMIDE 50 MG/5ML IV SOLN
INTRAVENOUS | Status: AC
  Filled 2017-06-16: qty 1

## 2017-06-16 SURGICAL SUPPLY — 24 items
ATOMIZER TRACHEAL (MISCELLANEOUS) ×2 IMPLANT
BANDAGE EYE OVAL (MISCELLANEOUS) ×2 IMPLANT
BASIN GRAD PLASTIC 32OZ STRL (MISCELLANEOUS) ×2 IMPLANT
CUP MEDICINE 2OZ PLAST GRAD ST (MISCELLANEOUS) ×4 IMPLANT
DRAPE TABLE BACK 80X90 (DRAPES) ×2 IMPLANT
DRSG TELFA 4X3 1S NADH ST (GAUZE/BANDAGES/DRESSINGS) ×2 IMPLANT
GLOVE BIO SURGEON STRL SZ7.5 (GLOVE) ×2 IMPLANT
GOWN STRL REUS W/ TWL LRG LVL3 (GOWN DISPOSABLE) ×2 IMPLANT
GOWN STRL REUS W/TWL LRG LVL3 (GOWN DISPOSABLE) ×4
LABEL OR SOLS (LABEL) ×2 IMPLANT
MARKER SKIN DUAL TIP RULER LAB (MISCELLANEOUS) ×2 IMPLANT
NDL FILTER BLUNT 18X1 1/2 (NEEDLE) ×2 IMPLANT
NDL SAFETY ECLIPSE 18X1.5 (NEEDLE) ×1 IMPLANT
NEEDLE FILTER BLUNT 18X 1/2SAF (NEEDLE) ×2
NEEDLE FILTER BLUNT 18X1 1/2 (NEEDLE) ×2 IMPLANT
NEEDLE HYPO 18GX1.5 SHARP (NEEDLE) ×2
PATTIES SURGICAL .5 X.5 (GAUZE/BANDAGES/DRESSINGS) ×2 IMPLANT
SOL ANTI-FOG 6CC FOG-OUT (MISCELLANEOUS) ×1 IMPLANT
SOL FOG-OUT ANTI-FOG 6CC (MISCELLANEOUS) ×1
SPONGE XRAY 4X4 16PLY STRL (MISCELLANEOUS) ×2 IMPLANT
SYR 10ML LL (SYRINGE) ×2 IMPLANT
SYR 3ML LL SCALE MARK (SYRINGE) ×4 IMPLANT
TOWEL OR 17X26 4PK STRL BLUE (TOWEL DISPOSABLE) ×2 IMPLANT
TUBING CONNECTING 10 (TUBING) ×2 IMPLANT

## 2017-06-16 NOTE — Discharge Instructions (Signed)
Laryngoscopy, Care After Refer to this sheet in the next few weeks. These instructions provide you with information about caring for yourself after your procedure. Your health care provider may also give you more specific instructions. Your treatment has been planned according to current medical practices, but problems sometimes occur. Call your health care provider if you have any problems or questions after your procedure. What can I expect after the procedure? After the procedure, it is common to have:  A sore throat.  A hoarse voice.  A temporary change in how your voice sounds.  Follow these instructions at home: Driving  Do not drive or operate heavy machinery while taking prescription pain medicine.  Do not drive for 24 hours if you received a medicine to help you relax (sedative). General instructions   Take over-the-counter and prescription medicines only as told by your health care provider.  Return to your normal activities as told by your health care provider. Ask your health care provider what activities are safe for you. ? If your laryngoscopy was done using medicine to numb only your throat (local anesthetic), you may be able to return to your normal activities right away.  If your health care provider took tissue from your throat for lab testing (biopsy), it is your responsibility to get your test results. Ask your health care provider or the department performing the procedure when your results will be ready.  Do not smoke or use any tobacco products, such as cigarettes, chewing tobacco, or e-cigarettes. If you need help quitting, ask your health care provider.  Follow instructions from your health care provider about eating or drinking restrictions.  Keep all follow-up visits as told by your health care provider. This is important. Contact a health care provider if:  You have a fever.  You develop a cough.  You develop nausea and vomiting. Get help right away  if:  You have severe pain.  You have chest pain.  You have new bleeding during coughing, spitting, or vomiting.  You develop new problems with swallowing.  You have difficulty breathing. This information is not intended to replace advice given to you by your health care provider. Make sure you discuss any questions you have with your health care provider. Document Released: 07/25/2015 Document Revised: 12/04/2015 Document Reviewed: 06/22/2015 Elsevier Interactive Patient Education  2018 [place]   1) The drugs that you were given will stay in your system until tomorrow so for the next 24 hours you should not:  A) Drive an automobile B) Make any legal decisions C) Drink any alcoholic beverage   2) You may resume regular meals tomorrow.  Today it is better to start with liquids and gradually work up to solid foods.  You may eat anything you prefer, but it is better to start with liquids, then soup and crackers, and gradually work up to solid foods.   3) Please notify your doctor immediately if you have any unusual bleeding, trouble breathing, redness and pain at the surgery site, drainage, fever, or pain not relieved by medication.  4) Additional Instructions:   Please contact your physician with any problems or Same Day Surgery at [phone], Monday through Friday 6 am to 4 pm, or [place] [facility] number at [phone].

## 2017-06-16 NOTE — Transfer of Care (Signed)
Immediate Anesthesia Transfer of Care Note  Patient: Wendy Kelly  Procedure(s) Performed: MICRO DIRECT LARYNGOSCOPY WITH BIOPSY OF RIGHT BASE OF TONGUE (Right )  Patient Location: PACU  Anesthesia Type:General  Level of Consciousness: awake and responds to stimulation  Airway & Oxygen Therapy: Patient Spontanous Breathing and Patient connected to face mask oxygen  Post-op Assessment: Report given to RN and Post -op Vital signs reviewed and stable  Post vital signs: Reviewed and stable  Last Vitals:  Vitals:   06/16/17 1131 06/16/17 1134  BP: (!) 150/98   Pulse: 65   Resp: 17   Temp:  (!) (P) 36.1 C  SpO2: 100%     Last Pain:  Vitals:   06/16/17 0911  TempSrc: Tympanic  PainSc: 0-No pain      Patients Stated Pain Goal: 0 (27/78/24 2353)  Complications: No apparent anesthesia complications

## 2017-06-16 NOTE — H&P (Signed)
..  History and Physical paper copy reviewed and updated date of procedure and will be scanned into system.  Patient seen and examined.  

## 2017-06-16 NOTE — Anesthesia Preprocedure Evaluation (Signed)
Anesthesia Evaluation  Patient identified by MRN, date of birth, ID band Patient awake    Reviewed: Allergy & Precautions, H&P , NPO status , Patient's Chart, lab work & pertinent test results, reviewed documented beta blocker date and time   Airway Mallampati: II  TM Distance: >3 FB Neck ROM: full    Dental  (+) Teeth Intact, Poor Dentition   Pulmonary neg pulmonary ROS, shortness of breath and with exertion,    Pulmonary exam normal        Cardiovascular Exercise Tolerance: Good + Peripheral Vascular Disease  negative cardio ROS Normal cardiovascular exam+ dysrhythmias + pacemaker  Rhythm:regular Rate:Normal     Neuro/Psych PSYCHIATRIC DISORDERS TIAResidual Symptoms negative neurological ROS  negative psych ROS   GI/Hepatic negative GI ROS, Neg liver ROS, GERD  Medicated,  Endo/Other  negative endocrine ROS  Renal/GU negative Renal ROS  negative genitourinary   Musculoskeletal   Abdominal   Peds  Hematology negative hematology ROS (+)   Anesthesia Other Findings Past Medical History: No date: AA (aortic aneurysm) ([place])     Comment:  a. 04/2011 Echo: Ao Root: 4.1cm, Asc Ao 4.7cm. No date: Anxiety No date: AV block, 2nd degree     Comment:  a. 05/2005 - s/p MDT Adapta ADDR01 Dual Chamber PPM No date: Bicuspid aortic valve No date: Cancer ([place]) No date: Chest pain     Comment:  a. 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 No date: Depression No date: Expressive aphasia     Comment:  a. ongoing since 04/2011 - seen by neurology - ? TIA vs.              Migraine No date: Facial numbness     Comment:  a. ongoing since 04/2011 - seen by neurology - ? TIA vs.              Migraine No date: GERD (gastroesophageal reflux disease) No date: Low blood pressure No date: Presence of permanent cardiac pacemaker 05/2017: Squamous cell carcinoma     Comment:  lymph node right side of neck No date: Syncope     Comment:  a. 04/2011 Echo:  EF 55-65%, No RWMA, Gr 1 DD. Past Surgical History: No date: ABDOMINAL WALL MESH  REMOVAL No date: bladder tack 2004: cataract; Bilateral 2000: CHOLECYSTECTOMY No date: DILATION AND CURETTAGE OF UTERUS 06/04/2015: EP IMPLANTABLE DEVICE; N/A     Comment:  Procedure: PPM Generator Changeout;  Surgeon: Deboraha Sprang, MD;  Location: [facility];  Service:               Cardiovascular;  Laterality: N/A; 12/18/2015: ESOPHAGOGASTRODUODENOSCOPY (EGD) WITH PROPOFOL; N/A     Comment:  Procedure: ESOPHAGOGASTRODUODENOSCOPY (EGD) WITH gastric              biopsy and dilation;  Surgeon: Lucilla Lame, MD;                Location: [facility];  Service: Endoscopy;                Laterality: N/A; No date: EYE SURGERY No date: INSERT / REPLACE / REMOVE PACEMAKER 2006: PACEMAKER INSERTION     Comment:  Medtronic Adapta ADDR01 No date: TONSILLECTOMY No date: TONSILLECTOMY 2013: VAGINAL HYSTERECTOMY     Comment:  mad/cope BMI    Body Mass Index:  30.55 kg/m     Reproductive/Obstetrics negative OB ROS  Anesthesia Physical Anesthesia Plan  ASA: III  Anesthesia Plan: General ETT   Post-op Pain Management:    Induction:   PONV Risk Score and Plan: 4 or greater  Airway Management Planned:   Additional Equipment:   Intra-op Plan:   Post-operative Plan:   Informed Consent: I have reviewed the patients History and Physical, chart, labs and discussed the procedure including the risks, benefits and alternatives for the proposed anesthesia with the patient or authorized representative who has indicated his/her understanding and acceptance.   Dental Advisory Given  Plan Discussed with: CRNA  Anesthesia Plan Comments:         Anesthesia Quick Evaluation

## 2017-06-16 NOTE — OR Nursing (Signed)
Patient had biopsies right and left back of tongue

## 2017-06-16 NOTE — Progress Notes (Signed)
START ON PATHWAY REGIMEN - Head and Neck     Administer weekly:     Cisplatin   **Always confirm dose/schedule in your pharmacy ordering system**    Patient Characteristics: Oropharynx, HPV Positive, Pathologically Staged, T0-4, pN1-2 or T4, pN0 Disease Classification: Oropharynx HPV Status: Positive (+) AJCC N Category: pN1 AJCC 8 Stage Grouping: I Current Disease Status: No Distant Metastases and No Recurrent Disease AJCC T Category: T1 AJCC M Category: M0 Intent of Therapy: Curative Intent, Discussed with Patient

## 2017-06-16 NOTE — Addendum Note (Signed)
Addended by: Sindy Guadeloupe on: 06/16/2017 04:33 PM   Modules accepted: Orders

## 2017-06-16 NOTE — Telephone Encounter (Signed)
Called pt to let pt know that we did hear from Dr. Pryor Ochoa and she will need chemo.  In order to set this up she needs port placement, chemo education class, audiogram and chemo appt.  I put the chemo class, chrystal appt and audiogram on same day but pt feels like that is a lot in 1 day.  I will change the chemo class 12/13 if possible and she will go see chrystal and audiogram on 12/11/  Radiation is 1:30 and audiogram is 3:00 at [facility].  I will let her know the port date when I get one. I will tell her the chemo date also.

## 2017-06-16 NOTE — Op Note (Signed)
....  06/16/2017  11:19 AM    Kelly, Wendy Signs  [id number]   Pre-Op Dx:  Metastatic HPV + SCCA  Post-op Dx: same  Proc: Suspension Microlaryngoscopy with biopsy  Surg:  Wendy Kelly  Anes:  GOT  EBL:  25  Comp:  None  Indications:  Right level II Metastatic SCCA.  PET showed mild increased uptake in right base of tongue and posterior left glottis.  Findings:  Firmness and inflammation at right base of tongue adjacent to residual tonsil tag at glosso-tonsillar junction.  Multiple biopsies taken of right base of tongue for frozen section.  This showed inflammation but no obvious squamous cell carcinoma.  Additional right base of tongue/glosso-tonsillar fold biopsies taken.  Left base of tongue biopsies taken.  Nasopharyngeal biopsies taken.  Additional base of tongue biopsy on right was positive for invasive squamous cell carcinoma.  No additional abnormal appearing lesions were seen.  No abnormality see in posterior glottis or post-cricoid region.  Procedure: After the patient was identified in holding and the history and physical and consent was reviewed, the patient was taken to the operating room and placed in a supine position. General endotracheal anesthesia was induced in the normal fashion.  At this time, the patient was rotated 90 degrees and a shoulder roll was placed as well as well as a mouth guard.   At this time,Dedo laryngoscope was inserted into the patient's oral cavity. Visualization of the oral cavity, oropharynx, pharynx and larynx was made. This demonstrated an area of firmness and inflammation near right base of tongue/tonsil junction.  A magnified zero degree Hopkin's rod was used to evaluate the mass. Topical epinephrine on pledgets were placed against the right base of tongue for approximately one minute.  Under high power magnification, a cup forcep was used to biopsy the right base of tongue this was sent for frozen section and showed inflammation.   Additional deeper biopsies were taken as well as well as nasopharyngeal biopsies and left base of tongue biopsies.  Additional deeper biopsies were positive for invasive disease on frozen section.  The patient's entire airway was next evaluated for hemostasis and meticulous suctioning was performed. The patient was released from suspension and the mouth gag and laryngoscope was removed from the patient's oral cavity without injury to teeth, lips, or gums.    Dispo:   To PACU in good condition  Plan:  Soft Diet, follow up in 1 week for repeat evaluation and review of pathology.  Wendy Kelly  06/16/2017 11:19 AM

## 2017-06-16 NOTE — Anesthesia Post-op Follow-up Note (Signed)
Anesthesia QCDR form completed.        

## 2017-06-16 NOTE — Anesthesia Procedure Notes (Signed)
Procedure Name: Intubation Performed by: Lance Muss, CRNA Pre-anesthesia Checklist: Patient identified, Patient being monitored, Timeout performed, Emergency Drugs available and Suction available Patient Re-evaluated:Patient Re-evaluated prior to induction Oxygen Delivery Method: Circle system utilized Preoxygenation: Pre-oxygenation with 100% oxygen Induction Type: IV induction Ventilation: Mask ventilation without difficulty Laryngoscope Size: Mac and 3 Grade View: Grade I Tube type: Oral Rae Tube size: 6.5 mm Number of attempts: 1 Airway Equipment and Method: Stylet and LTA kit utilized Placement Confirmation: ETT inserted through vocal cords under direct vision,  positive ETCO2 and breath sounds checked- equal and bilateral Secured at: 21 cm Tube secured with: Tape Dental Injury: Teeth and Oropharynx as per pre-operative assessment

## 2017-06-17 ENCOUNTER — Encounter: Payer: Self-pay | Admitting: Otolaryngology

## 2017-06-17 ENCOUNTER — Other Ambulatory Visit: Payer: Self-pay | Admitting: *Deleted

## 2017-06-17 ENCOUNTER — Telehealth: Payer: Self-pay | Admitting: *Deleted

## 2017-06-17 ENCOUNTER — Telehealth: Payer: Self-pay

## 2017-06-17 DIAGNOSIS — C4442 Squamous cell carcinoma of skin of scalp and neck: Secondary | ICD-10-CM

## 2017-06-17 DIAGNOSIS — C760 Malignant neoplasm of head, face and neck: Secondary | ICD-10-CM

## 2017-06-17 MED ORDER — LORAZEPAM 0.5 MG PO TABS: 0.5000 mg | ORAL_TABLET | Freq: Four times a day (QID) | ORAL | 0 refills | Status: DC | PRN

## 2017-06-17 NOTE — Telephone Encounter (Signed)
Called Wendy Kelly to inform her of appt for port placement 06/22/17 -arrival time 1230 pm ,procedure time 1330. NPO after MN,  Must have a driver, cannot work that same day( as Wendy Kelly had hoped to) ,may take am meds w sips of h2o. Call Lanetta Inch RN with any questions.  Wendy Kelly acknowledged understanding  CAW

## 2017-06-17 NOTE — Telephone Encounter (Signed)
Called patient and left a message on her voicemail.  Let her know we have changed her appointment for chemo education from December 11 - December 13. Se will to come to the [place] check in at the front desk and asked where her class is being held.  I also said  that she can call me back if she has questions.

## 2017-06-17 NOTE — Anesthesia Postprocedure Evaluation (Signed)
Anesthesia Post Note  Patient: Wendy Kelly  Procedure(s) Performed: MICRO DIRECT LARYNGOSCOPY WITH BIOPSY OF RIGHT BASE OF TONGUE (Right )  Patient location during evaluation: PACU Anesthesia Type: General Level of consciousness: awake and alert Pain management: pain level controlled Vital Signs Assessment: post-procedure vital signs reviewed and stable Respiratory status: spontaneous breathing, nonlabored ventilation, respiratory function stable and patient connected to nasal cannula oxygen Cardiovascular status: blood pressure returned to baseline and stable Postop Assessment: no apparent nausea or vomiting Anesthetic complications: no     Last Vitals:  Vitals:   06/16/17 1254 06/16/17 1333  BP: (!) 156/87 (!) 145/72  Pulse: 67 61  Resp: 16 14  Temp: 36.6 C   SpO2: 96% 98%    Last Pain:  Vitals:   06/16/17 1254  TempSrc: Oral  PainSc: 5                  Molli Barrows

## 2017-06-20 ENCOUNTER — Ambulatory Visit: Payer: Self-pay | Admitting: Hematology and Oncology

## 2017-06-20 ENCOUNTER — Other Ambulatory Visit: Payer: Self-pay | Admitting: Radiology

## 2017-06-21 ENCOUNTER — Other Ambulatory Visit: Payer: Self-pay

## 2017-06-21 ENCOUNTER — Ambulatory Visit
Admission: RE | Admit: 2017-06-21 | Discharge: 2017-06-21 | Disposition: A | Discharge status: Home / Self Care | Payer: BLUE CROSS/BLUE SHIELD | Source: Ambulatory Visit | Attending: Radiation Oncology | Admitting: Radiation Oncology

## 2017-06-21 ENCOUNTER — Encounter: Payer: Self-pay | Admitting: Radiation Oncology

## 2017-06-21 VITALS — BP 128/81 | HR 84 | Temp 96.5°F | Resp 20 | Wt 177.6 lb

## 2017-06-21 DIAGNOSIS — Z510 Encounter for antineoplastic radiation therapy: Secondary | ICD-10-CM | POA: Insufficient documentation

## 2017-06-21 DIAGNOSIS — Z7982 Long term (current) use of aspirin: Secondary | ICD-10-CM | POA: Insufficient documentation

## 2017-06-21 DIAGNOSIS — I959 Hypotension, unspecified: Secondary | ICD-10-CM | POA: Diagnosis not present

## 2017-06-21 DIAGNOSIS — F40240 Claustrophobia: Secondary | ICD-10-CM | POA: Insufficient documentation

## 2017-06-21 DIAGNOSIS — F329 Major depressive disorder, single episode, unspecified: Secondary | ICD-10-CM | POA: Insufficient documentation

## 2017-06-21 DIAGNOSIS — R4701 Aphasia: Secondary | ICD-10-CM | POA: Diagnosis not present

## 2017-06-21 DIAGNOSIS — Z803 Family history of malignant neoplasm of breast: Secondary | ICD-10-CM | POA: Diagnosis not present

## 2017-06-21 DIAGNOSIS — C01 Malignant neoplasm of base of tongue: Secondary | ICD-10-CM | POA: Insufficient documentation

## 2017-06-21 DIAGNOSIS — R079 Chest pain, unspecified: Secondary | ICD-10-CM | POA: Insufficient documentation

## 2017-06-21 DIAGNOSIS — K219 Gastro-esophageal reflux disease without esophagitis: Secondary | ICD-10-CM | POA: Diagnosis not present

## 2017-06-21 DIAGNOSIS — C760 Malignant neoplasm of head, face and neck: Secondary | ICD-10-CM

## 2017-06-21 DIAGNOSIS — Z79899 Other long term (current) drug therapy: Secondary | ICD-10-CM | POA: Insufficient documentation

## 2017-06-21 DIAGNOSIS — F419 Anxiety disorder, unspecified: Secondary | ICD-10-CM | POA: Insufficient documentation

## 2017-06-21 DIAGNOSIS — Q231 Congenital insufficiency of aortic valve: Secondary | ICD-10-CM | POA: Diagnosis not present

## 2017-06-21 DIAGNOSIS — I714 Abdominal aortic aneurysm, without rupture: Secondary | ICD-10-CM | POA: Insufficient documentation

## 2017-06-21 DIAGNOSIS — H9201 Otalgia, right ear: Secondary | ICD-10-CM | POA: Diagnosis not present

## 2017-06-21 DIAGNOSIS — Z950 Presence of cardiac pacemaker: Secondary | ICD-10-CM | POA: Insufficient documentation

## 2017-06-21 LAB — SURGICAL PATHOLOGY

## 2017-06-21 NOTE — Patient Instructions (Signed)
Cisplatin injection  What is this medicine?  CISPLATIN (SIS pla tin) is a chemotherapy drug. It targets fast dividing cells, like cancer cells, and causes these cells to die. This medicine is used to treat many types of cancer like bladder, ovarian, and testicular cancers.  This medicine may be used for other purposes; ask your health care provider or pharmacist if you have questions.  COMMON BRAND NAME(S): Platinol, Platinol -AQ  What should I tell my health care provider before I take this medicine?  They need to know if you have any of these conditions:  -blood disorders  -hearing problems  -kidney disease  -recent or ongoing radiation therapy  -an unusual or allergic reaction to cisplatin, carboplatin, other chemotherapy, other medicines, foods, dyes, or preservatives  -pregnant or trying to get pregnant  -breast-feeding  How should I use this medicine?  This drug is given as an infusion into a vein. It is administered in a hospital or clinic by a specially trained health care professional.  Talk to your pediatrician regarding the use of this medicine in children. Special care may be needed.  Overdosage: If you think you have taken too much of this medicine contact a poison control center or emergency room at once.  NOTE: This medicine is only for you. Do not share this medicine with others.  What if I miss a dose?  It is important not to miss a dose. Call your doctor or health care professional if you are unable to keep an appointment.  What may interact with this medicine?  -dofetilide  -foscarnet  -medicines for seizures  -medicines to increase blood counts like filgrastim, pegfilgrastim, sargramostim  -probenecid  -pyridoxine used with altretamine  -rituximab  -some antibiotics like amikacin, gentamicin, neomycin, polymyxin B, streptomycin, tobramycin  -sulfinpyrazone  -vaccines  -zalcitabine  Talk to your doctor or health care professional before taking any of these  medicines:  -acetaminophen  -aspirin  -ibuprofen  -ketoprofen  -naproxen  This list may not describe all possible interactions. Give your health care provider a list of all the medicines, herbs, non-prescription drugs, or dietary supplements you use. Also tell them if you smoke, drink alcohol, or use illegal drugs. Some items may interact with your medicine.  What should I watch for while using this medicine?  Your condition will be monitored carefully while you are receiving this medicine. You will need important blood work done while you are taking this medicine.  This drug may make you feel generally unwell. This is not uncommon, as chemotherapy can affect healthy cells as well as cancer cells. Report any side effects. Continue your course of treatment even though you feel ill unless your doctor tells you to stop.  In some cases, you may be given additional medicines to help with side effects. Follow all directions for their use.  Call your doctor or health care professional for advice if you get a fever, chills or sore throat, or other symptoms of a cold or flu. Do not treat yourself. This drug decreases your body's ability to fight infections. Try to avoid being around people who are sick.  This medicine may increase your risk to bruise or bleed. Call your doctor or health care professional if you notice any unusual bleeding.  Be careful brushing and flossing your teeth or using a toothpick because you may get an infection or bleed more easily. If you have any dental work done, tell your dentist you are receiving this medicine.  Avoid taking products   that contain aspirin, acetaminophen, ibuprofen, naproxen, or ketoprofen unless instructed by your doctor. These medicines may hide a fever.  Do not become pregnant while taking this medicine. Women should inform their doctor if they wish to become pregnant or think they might be pregnant. There is a potential for serious side effects to an unborn child. Talk to  your health care professional or pharmacist for more information. Do not breast-feed an infant while taking this medicine.  Drink fluids as directed while you are taking this medicine. This will help protect your kidneys.  Call your doctor or health care professional if you get diarrhea. Do not treat yourself.  What side effects may I notice from receiving this medicine?  Side effects that you should report to your doctor or health care professional as soon as possible:  -allergic reactions like skin rash, itching or hives, swelling of the face, lips, or tongue  -signs of infection - fever or chills, cough, sore throat, pain or difficulty passing urine  -signs of decreased platelets or bleeding - bruising, pinpoint red spots on the skin, black, tarry stools, nosebleeds  -signs of decreased red blood cells - unusually weak or tired, fainting spells, lightheadedness  -breathing problems  -changes in hearing  -gout pain  -low blood counts - This drug may decrease the number of white blood cells, red blood cells and platelets. You may be at increased risk for infections and bleeding.  -nausea and vomiting  -pain, swelling, redness or irritation at the injection site  -pain, tingling, numbness in the hands or feet  -problems with balance, movement  -trouble passing urine or change in the amount of urine  Side effects that usually do not require medical attention (report to your doctor or health care professional if they continue or are bothersome):  -changes in vision  -loss of appetite  -metallic taste in the mouth or changes in taste  This list may not describe all possible side effects. Call your doctor for medical advice about side effects. You may report side effects to FDA at 1-800-FDA-1088.  Where should I keep my medicine?  This drug is given in a hospital or clinic and will not be stored at home.  NOTE: This sheet is a summary. It may not cover all possible information. If you have questions about this medicine,  talk to your doctor, pharmacist, or health care provider.   2018 Elsevier/Gold Standard (2007-10-03 14:40:54)

## 2017-06-21 NOTE — Consult Note (Signed)
NEW PATIENT EVALUATION  Name: Wendy Kelly  MRN: [id number]  Date:   06/21/2017     DOB: 09/07/1955   This 61 y.o. 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  REFERRING PHYSICIAN: Valerie Roys, DO  CHIEF COMPLAINT:  Chief Complaint  Patient presents with  . Cancer    Pt is here for initial consultation of [facility]     DIAGNOSIS: The encounter diagnosis was Squamous cell carcinoma of head and neck ([place]).   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 His been slowly growing and associated with some slight right ear pain. She was seen by ENT and on 05/30/2017 ultrasound confirmed a 4.8 cm node in the right cervical chain. Biopsy of this node lymph node showed metastatic squamous cell carcinoma P 16 positive. PET CT scan was performed and showed hypermetabolic activity in the large right level II nodal station as well as slight area of hypermetabolic activity in the right base of tongue. She's had random biopsies performed including base of tongue which is pending. She seen today for radiation oncology opinion. She states she is extremely claustrophobic. She specifically denies head and neck pain or dysphagia. Her weight has been stable.  PLANNED TREATMENT REGIMEN: Concurrent chemoradiation  PAST MEDICAL HISTORY:  has a past medical history of AA (aortic aneurysm) ([place]), Anxiety, AV block, 2nd degree, Bicuspid aortic valve, Cancer ([place]), Chest pain, Depression, Expressive aphasia, Facial numbness, GERD (gastroesophageal reflux disease), Low blood pressure, Presence of permanent cardiac pacemaker, Squamous cell carcinoma (05/2017), and Syncope.     PAST SURGICAL HISTORY:  Past Surgical History:  Procedure Laterality Date  . ABDOMINAL WALL MESH  REMOVAL    . bladder tack    . cataract Bilateral 2004  . CHOLECYSTECTOMY  2000  . DILATION AND CURETTAGE OF UTERUS    . DIRECT LARYNGOSCOPY Right 06/16/2017   Procedure: MICRO DIRECT LARYNGOSCOPY WITH BIOPSY OF RIGHT BASE OF TONGUE;  Surgeon: Carloyn Manner, MD;  Location: [facility];  Service: ENT;  Laterality: Right;  . EP IMPLANTABLE DEVICE N/A 06/04/2015   Procedure: PPM Generator Changeout;  Surgeon: Deboraha Sprang, MD;  Location: [facility];  Service: Cardiovascular;  Laterality: N/A;  . ESOPHAGOGASTRODUODENOSCOPY (EGD) WITH PROPOFOL N/A 12/18/2015   Procedure: ESOPHAGOGASTRODUODENOSCOPY (EGD) WITH gastric biopsy and dilation;  Surgeon: Lucilla Lame, MD;  Location: [facility];  Service: Endoscopy;  Laterality: N/A;  . EYE SURGERY    . INSERT / REPLACE / REMOVE PACEMAKER    . PACEMAKER INSERTION  2006   Medtronic Adapta ADDR01  . TONSILLECTOMY    . TONSILLECTOMY    . VAGINAL HYSTERECTOMY  2013   mad/cope    FAMILY HISTORY: family history includes Atrial fibrillation in her mother; Breast cancer (age of onset: 87) in her maternal aunt; COPD in her maternal grandmother and mother; Stroke in her father.  SOCIAL HISTORY:  reports that  has never smoked. she has never used smokeless tobacco. She reports that she does not drink alcohol or use drugs.  ALLERGIES: Cefdinir; Cephalosporins; Dairy aid [lactase]; Flagyl [metronidazole]; Lactose intolerance (gi); Penicillins; Phenylephrine-guaifenesin; Sulfonamide derivatives; Tape; Dexilant [dexlansoprazole]; Entex lq [phenylephrine-guaifenesin]; Other; Oxycodone; Succinylcholine; Codeine; Guaifenesin &  derivatives; Lactobacillus; Naproxen sodium; Pseudoephedrine; Sulfa antibiotics; Tetracyclines & related; and Wheat bran  MEDICATIONS:  Current Outpatient Medications  Medication Sig Dispense Refill  . aspirin 81 MG tablet  Take 81 mg by mouth daily.    Marland Kitchen b complex vitamins tablet Take 1 tablet by mouth daily.    Marland Kitchen BLACK ELDERBERRY,BERRY-FLOWER, PO Take 1 capsule by mouth daily.    Marland Kitchen CRANBERRY SOFT PO Take 2 each by mouth daily.     Marland Kitchen dexamethasone (DECADRON) 4 MG tablet Take 2 tablets by mouth once a day on the day after chemotherapy and then take 2 tablets two times a day for 2 days. Take with food. 30 tablet 1  . DHEA 10 MG TABS Take 10 mg by mouth every morning. 90 tablet 3  . Digestive Enzymes (DIGESTIVE ENZYME PO) Take 1-2 capsules by mouth daily.    . fexofenadine (ALLEGRA) 60 MG tablet Take 60 mg by mouth daily.    . fluticasone (FLONASE SENSIMIST) 27.5 MCG/SPRAY nasal spray Place 1 spray into the nose daily.     Marland Kitchen HYDROcodone-acetaminophen (HYCET) 7.5-325 mg/15 ml solution Take 10 mLs by mouth 4 (four) times daily as needed for moderate pain. 300 mL 0  . ibuprofen (ADVIL,MOTRIN) 200 MG tablet Takes 2-3 tablets as needed.    . lidocaine-prilocaine (EMLA) cream Apply to affected area once 30 g 3  . loratadine (CLARITIN) 10 MG tablet Take 10 mg by mouth daily.    Marland Kitchen LORazepam (ATIVAN) 0.5 MG tablet Take 1 tablet (0.5 mg total) by mouth every 6 (six) hours as needed (Nausea or vomiting). 30 tablet 0  . nitroGLYCERIN (NITROSTAT) 0.4 MG SL tablet Place 1 tablet (0.4 mg total) under the tongue every 5 (five) minutes as needed for chest pain. 25 tablet 3  . NON FORMULARY Take by mouth daily. Bone Up (calcium)     . ondansetron (ZOFRAN) 4 MG tablet Take 1 tablet (4 mg total) by mouth every 8 (eight) hours as needed for up to 10 doses for nausea or vomiting. 20 tablet 0  . ondansetron (ZOFRAN) 8 MG tablet Take 1 tablet (8 mg total) by mouth 2 (two) times daily as needed. Start on the third day after chemotherapy. 30 tablet 1  . PRESCRIPTION MEDICATION Place 1 tablet vaginally 3 (three) times a week. E3 1mg  and Testosterone 1mg  compound    . prochlorperazine (COMPAZINE) 10 MG tablet Take 1 tablet (10 mg total) by mouth  every 6 (six) hours as needed (Nausea or vomiting). 30 tablet 1   No current facility-administered medications for this encounter.     ECOG PERFORMANCE STATUS:  0 - Asymptomatic  REVIEW OF SYSTEMS:  Patient denies any weight loss, fatigue, weakness, fever, chills or night sweats. Patient denies any loss of vision, blurred vision. Patient denies any ringing  of the ears or hearing loss. No irregular heartbeat. Patient denies heart murmur or history of fainting. Patient denies any chest pain or pain radiating to her upper extremities. Patient denies any shortness of breath, difficulty breathing at night, cough or hemoptysis. Patient denies any swelling in the lower legs. Patient denies any nausea vomiting, vomiting of blood, or coffee ground material in the vomitus. Patient denies any stomach pain. Patient states has had normal bowel movements no significant constipation or diarrhea. Patient denies any dysuria, hematuria or significant nocturia. Patient denies any problems walking, swelling in the joints or loss of balance. Patient denies any skin changes, loss of hair or loss of weight. Patient denies  any excessive worrying or anxiety or significant depression. Patient denies any problems with insomnia. Patient denies excessive thirst, polyuria, polydipsia. Patient denies any swollen glands, patient denies easy bruising or easy bleeding. Patient denies any recent infections, allergies or URI. Patient "s visual fields have not changed significantly in recent time.    PHYSICAL EXAM: BP 128/81   Pulse 84   Temp (!) 96.5 F (35.8 C)   Resp 20   Wt 177 lb 9.3 oz (80.6 kg)   BMI 30.48 kg/m  Oral cavity is clear teeth are in good state of repair. She does have a prominent right level II cervical lymph node compatible with known diagnosis. Indirect mirror examination shows upper airway clear vallecula and base of tongue within normal limits. Well-developed well-nourished patient in NAD. HEENT reveals  PERLA, EOMI, discs not visualized.  Oral cavity is clear. No oral mucosal lesions are identified. Neck is clear without evidence of cervical or supraclavicular adenopathy. Lungs are clear to A&P. Cardiac examination is essentially unremarkable with regular rate and rhythm without murmur rub or thrill. Abdomen is benign with no organomegaly or masses noted. Motor sensory and DTR levels are equal and symmetric in the upper and lower extremities. Cranial nerves II through XII are grossly intact. Proprioception is intact. No peripheral adenopathy or edema is identified. No motor or sensory levels are noted. Crude visual fields are within normal range.  LABORATORY DATA: Pathology reports reviewed    RADIOLOGY RESULTS: PET CT scan reviewed and compatible above-stated findings   IMPRESSION: Stage III HPV positive squamous cell carcinoma probable base of tongue with level II nodal metastasis in 61 year old female  PLAN: At this time I to go ahead with concurrent chemoradiation. I would plan on delivering 7000 cGy to her base of tongue as well as right lymph node involvement. I would use PET/CT with fusion study for delineation of treatment fields. Risks and benefits of treatment including possible dysphasia fatigue skin reaction oral mucositis decreased taste xerostomia all were discussed in detail with the patient. She seems to comprehend my treatment plan well. There will be extra effort by both professional staff as well as technical staff to coordinate and manage concurrent chemoradiation and ensuing side effects during her treatments. I personally separate ordered CT simulation for early next week. We're trying to have an open facemask to decrease some of her claustrophobia. I've also assured her that treatment machines will not pose a problem. Patient comprehend my treatment plan well.  I would like to take this opportunity to thank you for allowing me to participate in the care of your  patient.Noreene Filbert, MD

## 2017-06-22 ENCOUNTER — Ambulatory Visit
Admission: RE | Admit: 2017-06-22 | Discharge: 2017-06-22 | Disposition: A | Discharge status: Home / Self Care | Payer: BLUE CROSS/BLUE SHIELD | Source: Ambulatory Visit | Attending: Oncology | Admitting: Oncology

## 2017-06-22 ENCOUNTER — Other Ambulatory Visit: Payer: Self-pay

## 2017-06-22 DIAGNOSIS — Z5111 Encounter for antineoplastic chemotherapy: Secondary | ICD-10-CM | POA: Diagnosis not present

## 2017-06-22 DIAGNOSIS — C4492 Squamous cell carcinoma of skin, unspecified: Secondary | ICD-10-CM | POA: Diagnosis not present

## 2017-06-22 DIAGNOSIS — C760 Malignant neoplasm of head, face and neck: Secondary | ICD-10-CM | POA: Diagnosis not present

## 2017-06-22 HISTORY — PX: IR FLUORO GUIDE PORT INSERTION RIGHT: IMG5741

## 2017-06-22 LAB — CBC
HEMATOCRIT: 44.3 % (ref 35.0–47.0)
Hemoglobin: 15.1 g/dL (ref 12.0–16.0)
MCH: 30.3 pg (ref 26.0–34.0)
MCHC: 34.1 g/dL (ref 32.0–36.0)
MCV: 89 fL (ref 80.0–100.0)
PLATELETS: 167 10*3/uL (ref 150–440)
RBC: 4.97 MIL/uL (ref 3.80–5.20)
RDW: 12.8 % (ref 11.5–14.5)
WBC: 6.9 10*3/uL (ref 3.6–11.0)

## 2017-06-22 LAB — PROTIME-INR
INR: 0.93
Prothrombin Time: 12.4 seconds (ref 11.4–15.2)

## 2017-06-22 LAB — BASIC METABOLIC PANEL
Anion gap: 11 (ref 5–15)
BUN: 19 mg/dL (ref 6–20)
CO2: 23 mmol/L (ref 22–32)
CREATININE: 0.72 mg/dL (ref 0.44–1.00)
Calcium: 9.4 mg/dL (ref 8.9–10.3)
Chloride: 108 mmol/L (ref 101–111)
GFR calc Af Amer: >60 mL/min (ref >60)
GLUCOSE: 102 mg/dL — AB (ref 65–99)
POTASSIUM: 3.8 mmol/L (ref 3.5–5.1)
SODIUM: 142 mmol/L (ref 135–145)

## 2017-06-22 LAB — APTT: APTT: 26 s (ref 24–36)

## 2017-06-22 MED ORDER — MIDAZOLAM HCL 5 MG/5ML IJ SOLN
INTRAMUSCULAR | Status: AC
  Filled 2017-06-22: qty 5

## 2017-06-22 MED ORDER — LIDOCAINE HCL (PF) 1 % IJ SOLN
30.0000 mL | Freq: Once | INTRAMUSCULAR | Status: DC
  Filled 2017-06-22: qty 30

## 2017-06-22 MED ORDER — HEPARIN (PORCINE) IN NACL 2-0.9 UNIT/ML-% IJ SOLN
INTRAMUSCULAR | Status: AC
  Filled 2017-06-22: qty 500

## 2017-06-22 MED ORDER — HEPARIN SOD (PORK) LOCK FLUSH 100 UNIT/ML IV SOLN
INTRAVENOUS | Status: AC
  Filled 2017-06-22: qty 5

## 2017-06-22 MED ORDER — FENTANYL CITRATE (PF) 100 MCG/2ML IJ SOLN
INTRAMUSCULAR | Status: AC
  Filled 2017-06-22: qty 2

## 2017-06-22 MED ORDER — LIDOCAINE-EPINEPHRINE (PF) 1 %-1:200000 IJ SOLN
INTRAMUSCULAR | Status: AC
  Filled 2017-06-22: qty 30

## 2017-06-22 MED ORDER — VANCOMYCIN HCL IN DEXTROSE 1-5 GM/200ML-% IV SOLN
1000.0000 mg | INTRAVENOUS | Status: AC
  Administered 2017-06-22: 1000 mg via INTRAVENOUS
  Filled 2017-06-22 (×2): qty 200

## 2017-06-22 MED ORDER — FENTANYL CITRATE (PF) 100 MCG/2ML IJ SOLN
INTRAMUSCULAR | Status: AC | PRN
  Administered 2017-06-22: 50 ug via INTRAVENOUS

## 2017-06-22 MED ORDER — SODIUM CHLORIDE 0.9 % IV SOLN: INTRAVENOUS | Status: DC

## 2017-06-22 MED ORDER — MIDAZOLAM HCL 5 MG/5ML IJ SOLN
INTRAMUSCULAR | Status: AC | PRN
  Administered 2017-06-22: 1 mg via INTRAVENOUS

## 2017-06-22 NOTE — Discharge Instructions (Signed)
Implanted Port Insertion, Care After °This sheet gives you information about how to care for yourself after your procedure. Your health care provider may also give you more specific instructions. If you have problems or questions, contact your health care provider. °What can I expect after the procedure? °After your procedure, it is common to have: °· Discomfort at the port insertion site. °· Bruising on the skin over the port. This should improve over 3-4 days. ° °Follow these instructions at home: °Port care °· After your port is placed, you will get a manufacturer's information card. The card has information about your port. Keep this card with you at all times. °· Take care of the port as told by your health care provider. Ask your health care provider if you or a family member can get training for taking care of the port at home. A home health care nurse may also take care of the port. °· Make sure to remember what type of port you have. °Incision care °· Follow instructions from your health care provider about how to take care of your port insertion site. Make sure you: °? Wash your hands with soap and water before you change your bandage (dressing). If soap and water are not available, use hand sanitizer. °? Change your dressing as told by your health care provider. °? Leave stitches (sutures), skin glue, or adhesive strips in place. These skin closures may need to stay in place for 2 weeks or longer. If adhesive strip edges start to loosen and curl up, you may trim the loose edges. Do not remove adhesive strips completely unless your health care provider tells you to do that. °· Check your port insertion site every day for signs of infection. Check for: °? More redness, swelling, or pain. °? More fluid or blood. °? Warmth. °? Pus or a bad smell. °General instructions °· Do not take baths, swim, or use a hot tub until your health care provider approves. °· Do not lift anything that is heavier than 10 lb (4.5  kg) for a week, or as told by your health care provider. °· Ask your health care provider when it is okay to: °? Return to work or school. °? Resume usual physical activities or sports. °· Do not drive for 24 hours if you were given a medicine to help you relax (sedative). °· Take over-the-counter and prescription medicines only as told by your health care provider. °· Wear a medical alert bracelet in case of an emergency. This will tell any health care providers that you have a port. °· Keep all follow-up visits as told by your health care provider. This is important. °Contact a health care provider if: °· You cannot flush your port with saline as directed, or you cannot draw blood from the port. °· You have a fever or chills. °· You have more redness, swelling, or pain around your port insertion site. °· You have more fluid or blood coming from your port insertion site. °· Your port insertion site feels warm to the touch. °· You have pus or a bad smell coming from the port insertion site. °Get help right away if: °· You have chest pain or shortness of breath. °· You have bleeding from your port that you cannot control. °Summary °· Take care of the port as told by your health care provider. °· Change your dressing as told by your health care provider. °· Keep all follow-up visits as told by your health care provider. °  This information is not intended to replace advice given to you by your health care provider. Make sure you discuss any questions you have with your health care provider. °Document Released: 04/18/2013 Document Revised: 05/19/2016 Document Reviewed: 05/19/2016 °Elsevier Interactive Patient Education © 2017 Elsevier Inc. ° °

## 2017-06-22 NOTE — Procedures (Signed)
  Procedure: R IJ Port placement   Preprocedure diagnosis: Squamous cell carcinoma Postprocedure diagnosis: same EBL:   minimal Complications:  none immediate  See full dictation in [facility].  Dillard Cannon MD Main # [phone] Pager  [phone]

## 2017-06-23 ENCOUNTER — Inpatient Hospital Stay: Payer: BLUE CROSS/BLUE SHIELD

## 2017-06-27 ENCOUNTER — Telehealth: Payer: Self-pay | Admitting: Oncology

## 2017-06-27 ENCOUNTER — Inpatient Hospital Stay: Payer: BLUE CROSS/BLUE SHIELD

## 2017-06-27 ENCOUNTER — Ambulatory Visit
Admission: RE | Admit: 2017-06-27 | Discharge: 2017-06-27 | Disposition: A | Discharge status: Home / Self Care | Payer: BLUE CROSS/BLUE SHIELD | Source: Ambulatory Visit | Attending: Radiation Oncology | Admitting: Radiation Oncology

## 2017-06-27 ENCOUNTER — Other Ambulatory Visit: Payer: Self-pay | Admitting: *Deleted

## 2017-06-27 DIAGNOSIS — Z510 Encounter for antineoplastic radiation therapy: Secondary | ICD-10-CM | POA: Diagnosis not present

## 2017-06-27 DIAGNOSIS — F419 Anxiety disorder, unspecified: Secondary | ICD-10-CM | POA: Diagnosis not present

## 2017-06-27 DIAGNOSIS — R079 Chest pain, unspecified: Secondary | ICD-10-CM | POA: Diagnosis not present

## 2017-06-27 DIAGNOSIS — Z950 Presence of cardiac pacemaker: Secondary | ICD-10-CM | POA: Diagnosis not present

## 2017-06-27 DIAGNOSIS — Q231 Congenital insufficiency of aortic valve: Secondary | ICD-10-CM | POA: Diagnosis not present

## 2017-06-27 DIAGNOSIS — F329 Major depressive disorder, single episode, unspecified: Secondary | ICD-10-CM | POA: Diagnosis not present

## 2017-06-27 DIAGNOSIS — I959 Hypotension, unspecified: Secondary | ICD-10-CM | POA: Diagnosis not present

## 2017-06-27 DIAGNOSIS — Z7982 Long term (current) use of aspirin: Secondary | ICD-10-CM | POA: Diagnosis not present

## 2017-06-27 DIAGNOSIS — F40240 Claustrophobia: Secondary | ICD-10-CM | POA: Diagnosis not present

## 2017-06-27 DIAGNOSIS — Z79899 Other long term (current) drug therapy: Secondary | ICD-10-CM | POA: Diagnosis not present

## 2017-06-27 DIAGNOSIS — R4701 Aphasia: Secondary | ICD-10-CM | POA: Diagnosis not present

## 2017-06-27 DIAGNOSIS — I714 Abdominal aortic aneurysm, without rupture: Secondary | ICD-10-CM | POA: Diagnosis not present

## 2017-06-27 DIAGNOSIS — C01 Malignant neoplasm of base of tongue: Secondary | ICD-10-CM | POA: Diagnosis not present

## 2017-06-27 DIAGNOSIS — Z803 Family history of malignant neoplasm of breast: Secondary | ICD-10-CM | POA: Diagnosis not present

## 2017-06-27 DIAGNOSIS — C760 Malignant neoplasm of head, face and neck: Secondary | ICD-10-CM

## 2017-06-27 DIAGNOSIS — K219 Gastro-esophageal reflux disease without esophagitis: Secondary | ICD-10-CM | POA: Diagnosis not present

## 2017-06-27 NOTE — Telephone Encounter (Signed)
Lab/MD/ * NEW CISPLATIN rschd from 06/28/17 to 07/08/17, per Dr Rao/Sherry/schd msg/verbal.

## 2017-06-27 NOTE — Progress Notes (Signed)
Nutrition Assessment   Reason for Assessment: Patient with new head and neck cancer   ASSESSMENT:  61 year old female with new diagnosis of head and neck cancer likely oropharynx base of the tongue HPV positive stage I. Planning chemotherapy with cisplatin and radiation.  Noted MD wants to hold off on prophylatic placement of PEG tube at this time.  Past medical history of AA, anxiety, AV block, GERD, pacemaker, low blood pressure.  Met with patient in clinic this pm. Patient tearful and anxious about how she is going to make it through treatment.  Patient reports appetite is good, has decreased a little with new diagnosis and had sore throat with biopsy.  Patient with allergy to milk products (gets tingling in tongue and mouth).  Drinks almond milk and has been drinking a protein shake called evolve (dairy free 150 calories, 20 g of protein).  Patient reports that she is eating a well balanced diet, meat, vegetables, grains and tolerating well at this time.     Medications: zofran, compazine, decadron, b complex vitamins  Labs: glucose 102  Anthropometrics:   Height: 64 inches Weight: 177 lb 9.3 oz UBW: 185 lb about 1 year ago (pt reports she has lost about 8 pounds but she would like to loose weight) BMI: 30  3% weight loss in 1 year, not significant   Estimated Energy Needs  Kcals: 2000-2400 calories/d Protein: 100-120 g/day Fluid: 2.4 L/d  NUTRITION DIAGNOSIS: Predicted suboptimal energy intake related to new diagnosis of head and neck cancer as evidenced by chemotherapy and radiation therapy planned and anticipated side effects from treamtent  MALNUTRITION DIAGNOSIS: continue to monitor   INTERVENTION:  Discussed importance of good nutrition during treatment. Discussed nutrition strategies regarding nausea and vomiting.  Fact sheet provided.  Discussed importance of protein and provided daily protein goal.  Provided patient with soft moist protein foods list.   Discussed  oral nutrition supplements (clear drinks also have whey protein added and patient avoids whey protein as well).  Provided patient with dairy free high calorie, high protein shake for added calories and protein.   Patient with questions regarding feeding tube and states she does not want a feeding tube.      MONITORING, EVALUATION, GOAL: Patient will consume adequate calories and protein to maintain weight during treatment.     NEXT VISIT: January 3rd following radiation therapy  Willam Munford B. [name], [place], [place] Registered Dietitian [phone] (pager)

## 2017-06-28 ENCOUNTER — Inpatient Hospital Stay: Payer: BLUE CROSS/BLUE SHIELD

## 2017-06-28 ENCOUNTER — Inpatient Hospital Stay: Payer: BLUE CROSS/BLUE SHIELD | Admitting: Oncology

## 2017-06-30 ENCOUNTER — Encounter: Payer: Self-pay | Admitting: Internal Medicine

## 2017-06-30 ENCOUNTER — Ambulatory Visit: Admit: 2017-06-30 | Payer: BLUE CROSS/BLUE SHIELD | Admitting: Otolaryngology

## 2017-06-30 SURGERY — EXCISION MASS
Anesthesia: General

## 2017-06-30 NOTE — Progress Notes (Signed)
[place] form for PPM/ ICD radiation therapy was completed by Dr. Caryl Comes and faxed to [phone]. Confirmation received.   Patient to start radiation therapy on 07/06/17.

## 2017-07-04 DIAGNOSIS — Q231 Congenital insufficiency of aortic valve: Secondary | ICD-10-CM | POA: Diagnosis not present

## 2017-07-04 DIAGNOSIS — R4701 Aphasia: Secondary | ICD-10-CM | POA: Diagnosis not present

## 2017-07-04 DIAGNOSIS — Z510 Encounter for antineoplastic radiation therapy: Secondary | ICD-10-CM | POA: Diagnosis not present

## 2017-07-04 DIAGNOSIS — F40240 Claustrophobia: Secondary | ICD-10-CM | POA: Diagnosis not present

## 2017-07-04 DIAGNOSIS — R079 Chest pain, unspecified: Secondary | ICD-10-CM | POA: Diagnosis not present

## 2017-07-04 DIAGNOSIS — C01 Malignant neoplasm of base of tongue: Secondary | ICD-10-CM | POA: Diagnosis not present

## 2017-07-04 DIAGNOSIS — F329 Major depressive disorder, single episode, unspecified: Secondary | ICD-10-CM | POA: Diagnosis not present

## 2017-07-04 DIAGNOSIS — I714 Abdominal aortic aneurysm, without rupture: Secondary | ICD-10-CM | POA: Diagnosis not present

## 2017-07-04 DIAGNOSIS — Z803 Family history of malignant neoplasm of breast: Secondary | ICD-10-CM | POA: Diagnosis not present

## 2017-07-04 DIAGNOSIS — Z79899 Other long term (current) drug therapy: Secondary | ICD-10-CM | POA: Diagnosis not present

## 2017-07-04 DIAGNOSIS — I959 Hypotension, unspecified: Secondary | ICD-10-CM | POA: Diagnosis not present

## 2017-07-04 DIAGNOSIS — K219 Gastro-esophageal reflux disease without esophagitis: Secondary | ICD-10-CM | POA: Diagnosis not present

## 2017-07-04 DIAGNOSIS — Z950 Presence of cardiac pacemaker: Secondary | ICD-10-CM | POA: Diagnosis not present

## 2017-07-04 DIAGNOSIS — Z7982 Long term (current) use of aspirin: Secondary | ICD-10-CM | POA: Diagnosis not present

## 2017-07-04 DIAGNOSIS — F419 Anxiety disorder, unspecified: Secondary | ICD-10-CM | POA: Diagnosis not present

## 2017-07-06 ENCOUNTER — Ambulatory Visit
Admission: RE | Admit: 2017-07-06 | Discharge: 2017-07-06 | Disposition: A | Discharge status: Home / Self Care | Payer: BLUE CROSS/BLUE SHIELD | Source: Ambulatory Visit | Attending: Radiation Oncology | Admitting: Radiation Oncology

## 2017-07-07 ENCOUNTER — Other Ambulatory Visit: Payer: Self-pay | Admitting: Internal Medicine

## 2017-07-07 ENCOUNTER — Ambulatory Visit
Admission: RE | Admit: 2017-07-07 | Discharge: 2017-07-07 | Disposition: A | Discharge status: Home / Self Care | Payer: BLUE CROSS/BLUE SHIELD | Source: Ambulatory Visit | Attending: Radiation Oncology | Admitting: Radiation Oncology

## 2017-07-07 ENCOUNTER — Telehealth: Payer: Self-pay

## 2017-07-07 DIAGNOSIS — Z7982 Long term (current) use of aspirin: Secondary | ICD-10-CM | POA: Diagnosis not present

## 2017-07-07 DIAGNOSIS — F329 Major depressive disorder, single episode, unspecified: Secondary | ICD-10-CM | POA: Diagnosis not present

## 2017-07-07 DIAGNOSIS — R079 Chest pain, unspecified: Secondary | ICD-10-CM | POA: Diagnosis not present

## 2017-07-07 DIAGNOSIS — F419 Anxiety disorder, unspecified: Secondary | ICD-10-CM | POA: Diagnosis not present

## 2017-07-07 DIAGNOSIS — R4701 Aphasia: Secondary | ICD-10-CM | POA: Diagnosis not present

## 2017-07-07 DIAGNOSIS — K219 Gastro-esophageal reflux disease without esophagitis: Secondary | ICD-10-CM | POA: Diagnosis not present

## 2017-07-07 DIAGNOSIS — Q231 Congenital insufficiency of aortic valve: Secondary | ICD-10-CM | POA: Diagnosis not present

## 2017-07-07 DIAGNOSIS — Z510 Encounter for antineoplastic radiation therapy: Secondary | ICD-10-CM | POA: Diagnosis not present

## 2017-07-07 DIAGNOSIS — Z79899 Other long term (current) drug therapy: Secondary | ICD-10-CM | POA: Diagnosis not present

## 2017-07-07 DIAGNOSIS — I959 Hypotension, unspecified: Secondary | ICD-10-CM | POA: Diagnosis not present

## 2017-07-07 DIAGNOSIS — Z803 Family history of malignant neoplasm of breast: Secondary | ICD-10-CM | POA: Diagnosis not present

## 2017-07-07 DIAGNOSIS — Z950 Presence of cardiac pacemaker: Secondary | ICD-10-CM | POA: Diagnosis not present

## 2017-07-07 DIAGNOSIS — I714 Abdominal aortic aneurysm, without rupture: Secondary | ICD-10-CM | POA: Diagnosis not present

## 2017-07-07 DIAGNOSIS — F40240 Claustrophobia: Secondary | ICD-10-CM | POA: Diagnosis not present

## 2017-07-07 DIAGNOSIS — C01 Malignant neoplasm of base of tongue: Secondary | ICD-10-CM | POA: Diagnosis not present

## 2017-07-07 NOTE — Telephone Encounter (Signed)
Nutrition  Patient called RD to report dry mouth, lips burning sensation and now dry following first radiation treatment.  Chemotherapy due to start on 12/28.   RD spoke with radiation staff and radiation to call patient back and follow-up on symptoms.  Called to inform patient that radiation staff would be calling her to discuss symptoms.  Wendy Kelly, [place], [place] Registered Dietitian [phone] (pager)

## 2017-07-07 NOTE — Telephone Encounter (Signed)
Nutrition  Called patient and spoke with her regarding dairy free, lactose free shake options.  Emailed list as well.  Teah Votaw B. [name], [place], [place] Registered Dietitian [phone] (pager)

## 2017-07-08 ENCOUNTER — Inpatient Hospital Stay: Payer: BLUE CROSS/BLUE SHIELD

## 2017-07-08 ENCOUNTER — Ambulatory Visit
Admission: RE | Admit: 2017-07-08 | Discharge: 2017-07-08 | Disposition: A | Discharge status: Home / Self Care | Payer: BLUE CROSS/BLUE SHIELD | Source: Ambulatory Visit | Attending: Radiation Oncology | Admitting: Radiation Oncology

## 2017-07-08 ENCOUNTER — Inpatient Hospital Stay (HOSPITAL_BASED_OUTPATIENT_CLINIC_OR_DEPARTMENT_OTHER): Payer: BLUE CROSS/BLUE SHIELD | Admitting: Oncology

## 2017-07-08 ENCOUNTER — Encounter: Payer: Self-pay | Admitting: Oncology

## 2017-07-08 VITALS — BP 128/84 | HR 62 | Temp 97.1°F | Resp 20

## 2017-07-08 DIAGNOSIS — F40240 Claustrophobia: Secondary | ICD-10-CM | POA: Diagnosis not present

## 2017-07-08 DIAGNOSIS — Z7982 Long term (current) use of aspirin: Secondary | ICD-10-CM

## 2017-07-08 DIAGNOSIS — I4891 Unspecified atrial fibrillation: Secondary | ICD-10-CM

## 2017-07-08 DIAGNOSIS — F329 Major depressive disorder, single episode, unspecified: Secondary | ICD-10-CM | POA: Diagnosis not present

## 2017-07-08 DIAGNOSIS — F419 Anxiety disorder, unspecified: Secondary | ICD-10-CM | POA: Diagnosis not present

## 2017-07-08 DIAGNOSIS — I959 Hypotension, unspecified: Secondary | ICD-10-CM | POA: Diagnosis not present

## 2017-07-08 DIAGNOSIS — C01 Malignant neoplasm of base of tongue: Secondary | ICD-10-CM | POA: Diagnosis not present

## 2017-07-08 DIAGNOSIS — K219 Gastro-esophageal reflux disease without esophagitis: Secondary | ICD-10-CM | POA: Diagnosis not present

## 2017-07-08 DIAGNOSIS — Z950 Presence of cardiac pacemaker: Secondary | ICD-10-CM | POA: Diagnosis not present

## 2017-07-08 DIAGNOSIS — R590 Localized enlarged lymph nodes: Secondary | ICD-10-CM

## 2017-07-08 DIAGNOSIS — C760 Malignant neoplasm of head, face and neck: Secondary | ICD-10-CM

## 2017-07-08 DIAGNOSIS — I714 Abdominal aortic aneurysm, without rupture: Secondary | ICD-10-CM | POA: Diagnosis not present

## 2017-07-08 DIAGNOSIS — Z803 Family history of malignant neoplasm of breast: Secondary | ICD-10-CM | POA: Diagnosis not present

## 2017-07-08 DIAGNOSIS — D3500 Benign neoplasm of unspecified adrenal gland: Secondary | ICD-10-CM

## 2017-07-08 DIAGNOSIS — Z90710 Acquired absence of both cervix and uterus: Secondary | ICD-10-CM

## 2017-07-08 DIAGNOSIS — H9201 Otalgia, right ear: Secondary | ICD-10-CM

## 2017-07-08 DIAGNOSIS — C109 Malignant neoplasm of oropharynx, unspecified: Secondary | ICD-10-CM

## 2017-07-08 DIAGNOSIS — C770 Secondary and unspecified malignant neoplasm of lymph nodes of head, face and neck: Secondary | ICD-10-CM

## 2017-07-08 DIAGNOSIS — R4701 Aphasia: Secondary | ICD-10-CM | POA: Diagnosis not present

## 2017-07-08 DIAGNOSIS — Z5111 Encounter for antineoplastic chemotherapy: Secondary | ICD-10-CM | POA: Diagnosis not present

## 2017-07-08 DIAGNOSIS — Q231 Congenital insufficiency of aortic valve: Secondary | ICD-10-CM | POA: Diagnosis not present

## 2017-07-08 DIAGNOSIS — R5383 Other fatigue: Secondary | ICD-10-CM

## 2017-07-08 DIAGNOSIS — K5730 Diverticulosis of large intestine without perforation or abscess without bleeding: Secondary | ICD-10-CM

## 2017-07-08 DIAGNOSIS — R079 Chest pain, unspecified: Secondary | ICD-10-CM

## 2017-07-08 DIAGNOSIS — I441 Atrioventricular block, second degree: Secondary | ICD-10-CM

## 2017-07-08 DIAGNOSIS — Z510 Encounter for antineoplastic radiation therapy: Secondary | ICD-10-CM | POA: Diagnosis not present

## 2017-07-08 DIAGNOSIS — R0609 Other forms of dyspnea: Secondary | ICD-10-CM | POA: Diagnosis not present

## 2017-07-08 DIAGNOSIS — Z79899 Other long term (current) drug therapy: Secondary | ICD-10-CM

## 2017-07-08 DIAGNOSIS — R002 Palpitations: Secondary | ICD-10-CM | POA: Diagnosis not present

## 2017-07-08 DIAGNOSIS — Z8619 Personal history of other infectious and parasitic diseases: Secondary | ICD-10-CM

## 2017-07-08 DIAGNOSIS — Z8673 Personal history of transient ischemic attack (TIA), and cerebral infarction without residual deficits: Secondary | ICD-10-CM

## 2017-07-08 LAB — CBC WITH DIFFERENTIAL/PLATELET
BASOS PCT: 1 %
Basophils Absolute: 0 10*3/uL (ref 0–0.1)
EOS ABS: 0.1 10*3/uL (ref 0–0.7)
EOS PCT: 3 %
HCT: 39.4 % (ref 35.0–47.0)
Hemoglobin: 13.4 g/dL (ref 12.0–16.0)
LYMPHS ABS: 1 10*3/uL (ref 1.0–3.6)
Lymphocytes Relative: 24 %
MCH: 30.7 pg (ref 26.0–34.0)
MCHC: 34.1 g/dL (ref 32.0–36.0)
MCV: 89.9 fL (ref 80.0–100.0)
MONOS PCT: 9 %
Monocytes Absolute: 0.4 10*3/uL (ref 0.2–0.9)
Neutro Abs: 2.8 10*3/uL (ref 1.4–6.5)
Neutrophils Relative %: 63 %
Platelets: 144 10*3/uL — ABNORMAL LOW (ref 150–440)
RBC: 4.38 MIL/uL (ref 3.80–5.20)
RDW: 13.2 % (ref 11.5–14.5)
WBC: 4.4 10*3/uL (ref 3.6–11.0)

## 2017-07-08 LAB — COMPREHENSIVE METABOLIC PANEL
ALK PHOS: 78 U/L (ref 38–126)
ALT: 45 U/L (ref 14–54)
AST: 39 U/L (ref 15–41)
Albumin: 3.8 g/dL (ref 3.5–5.0)
Anion gap: 6 (ref 5–15)
BUN: 18 mg/dL (ref 6–20)
CALCIUM: 9.1 mg/dL (ref 8.9–10.3)
CHLORIDE: 108 mmol/L (ref 101–111)
CO2: 26 mmol/L (ref 22–32)
CREATININE: 0.62 mg/dL (ref 0.44–1.00)
GFR calc Af Amer: >60 mL/min (ref >60)
Glucose, Bld: 100 mg/dL — ABNORMAL HIGH (ref 65–99)
Potassium: 3.9 mmol/L (ref 3.5–5.1)
Sodium: 140 mmol/L (ref 135–145)
Total Bilirubin: 1.2 mg/dL (ref 0.3–1.2)
Total Protein: 6.6 g/dL (ref 6.5–8.1)

## 2017-07-08 MED ORDER — PALONOSETRON HCL INJECTION 0.25 MG/5ML
0.2500 mg | Freq: Once | INTRAVENOUS | Status: AC
  Administered 2017-07-08: 0.25 mg via INTRAVENOUS
  Filled 2017-07-08: qty 5

## 2017-07-08 MED ORDER — POTASSIUM CHLORIDE 2 MEQ/ML IV SOLN
Freq: Once | INTRAVENOUS | Status: AC
  Administered 2017-07-08: 11:00:00 via INTRAVENOUS
  Filled 2017-07-08: qty 1000

## 2017-07-08 MED ORDER — HEPARIN SOD (PORK) LOCK FLUSH 100 UNIT/ML IV SOLN
500.0000 [IU] | Freq: Once | INTRAVENOUS | Status: AC | PRN
  Administered 2017-07-08: 500 [IU]

## 2017-07-08 MED ORDER — SODIUM CHLORIDE 0.9 % IV SOLN
Freq: Once | INTRAVENOUS | Status: AC
  Administered 2017-07-08: 11:00:00 via INTRAVENOUS
  Filled 2017-07-08: qty 1000

## 2017-07-08 MED ORDER — SODIUM CHLORIDE 0.9 % IV SOLN
40.0000 mg/m2 | Freq: Once | INTRAVENOUS | Status: AC
  Administered 2017-07-08: 76 mg via INTRAVENOUS
  Filled 2017-07-08: qty 76

## 2017-07-08 MED ORDER — SODIUM CHLORIDE 0.9 % IV SOLN
Freq: Once | INTRAVENOUS | Status: AC
  Administered 2017-07-08: 12:00:00 via INTRAVENOUS
  Filled 2017-07-08: qty 5

## 2017-07-08 NOTE — Progress Notes (Signed)
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  Patient is a 61 year old female who was seen by Dr. Pryor Ochoa for evaluation of right neck mass.  The mass has been present for or a year and has been growing gradually.  It was associated with some right ear pain but no fever.  Patient has been a never smoker.  Ultrasound on 05/30/2017 showed:Single enlarged lymph node in the right neck measures approximately 4.8 x 1.7 x 3.0 cm  2.  Patient underwent IR guided lymph node biopsy of the cervical lymph node which showed metastatic squamous cell carcinoma.  Staining for P 16 was positive and sox 10 was negative   3.  PET/CT scan on 06/09/2017 showed intensely hypermetabolic enlarged right level 2 cervical lymph node with an SUV of 11.1.  No clear site of primary malignancy within the head and neck.  Potential site of investigation would include right base of tongue.  There was asymmetric metabolic activity noted in that area with an SUV of 4.5.  Less suspicious region in the posterior glottis.  No evidence of distant metastatic disease.  4. Patient works as a Geophysicist/field seismologist at [employer].  Patient has had multiple allergies to medications in the past.  Reports that when she took steroids in the past it made her quite aggravated.  Her sister was hospitalized in a mental health facility after receiving a steroid course in the past.  5. Right tongue base biopsy positive for SCC. Plan is to  proceed with concurrent chemo/RT with cisplatin/RT. Baseline audiogram normal  Interval history- she is anxious today but reports doing well otherwise. Has some discomfort from right neck swelling. Denies other complaints  ECOG PS- 0 Pain scale- 0   Review of systems- Review of Systems  Constitutional: Negative for chills, fever, malaise/fatigue and weight loss.  HENT: Negative for congestion, ear discharge and nosebleeds.   Eyes: Negative for blurred vision.  Respiratory: Negative for cough, hemoptysis, sputum production, shortness of breath and wheezing.   Cardiovascular: Negative for chest pain, palpitations, orthopnea and claudication.  Gastrointestinal: Negative for abdominal pain, blood in stool, constipation, diarrhea, heartburn, melena, nausea and vomiting.  Genitourinary: Negative for dysuria, flank pain, frequency, hematuria and urgency.  Musculoskeletal: Negative for back pain, joint pain and myalgias.  Skin: Negative for rash.  Neurological: Negative for dizziness, tingling, focal weakness, seizures, weakness and headaches.  Endo/Heme/Allergies: Does not bruise/bleed easily.  Psychiatric/Behavioral: Negative for depression and suicidal ideas. The patient does not have insomnia.       Allergies  Allergen Reactions  . Cefdinir Swelling    Swelling in mouth   . Cephalosporins Swelling  . Dairy Aid [Lactase] Swelling    Milk and milk products cause swelling and tingling of tongue  . Flagyl [Metronidazole] Swelling    Mouth and throat and ears go red and burn  . Lactose Intolerance (Gi) Other (See Comments)    GI upset.  Eats only gluten free  . Penicillins Rash    Rash/hives  .  Phenylephrine-Guaifenesin Anaphylaxis    Night terrors  . Sulfonamide Derivatives Rash    Rash/itching.  From head to her toes, the itching was horrible. Penicillin is the worst  . Tape Rash    Adhesives all cause a problem severely.  It doesn't matter if it is paper tape or not.  Marland Kitchen  Dexilant [Dexlansoprazole]     Pressure in head like head is in a vice and being squeezed  . Entex Lq [Phenylephrine-Guaifenesin]     Night terrors  . Other Other (See Comments)    Flu vaccine:  Weakness, dizziness, tia type symptoms.    . Oxycodone Nausea And Vomiting    And itching  . Succinylcholine Other (See Comments)    Trouble waking up  . Codeine Nausea And Vomiting  . Guaifenesin & Derivatives Other (See Comments)    Night terrors  . Lactobacillus Itching  . Naproxen Sodium     Itching/rash  . Pseudoephedrine Rash  . Sulfa Antibiotics Rash  . Tetracyclines & Related Other (See Comments)    Was taking flagyl at the same time; unsure which caused the swelling  Swelling of throat. Pharmacist states that he believes it was the flagyl and not the tetracycline  . Wheat Bran Other (See Comments)    Upset stomach. Now eats gluten free     Past Medical History:  Diagnosis Date  . AA (aortic aneurysm) ([place])    a. 04/2011 Echo: Ao Root: 4.1cm, Asc Ao 4.7cm.  . Anxiety   . AV block, 2nd degree    a. 05/2005 - s/p MDT Adapta ADDR01 Dual Chamber PPM  . Bicuspid aortic valve   . Cancer ([place])   . Chest pain    a. 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  . Depression   . Expressive aphasia    a. ongoing since 04/2011 - seen by neurology - ? TIA vs. Migraine  . Facial numbness    a. ongoing since 04/2011 - seen by neurology - ? TIA vs. Migraine  . GERD (gastroesophageal reflux disease)   . Low blood pressure   . Presence of permanent cardiac pacemaker   . 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 Syncope    a. 04/2011 Echo: EF 55-65%, No RWMA, Gr 1 DD.     Past Surgical History:  Procedure Laterality Date  . ABDOMINAL WALL MESH  REMOVAL    . bladder tack    . cataract Bilateral 2004  . CHOLECYSTECTOMY  2000  . DILATION AND CURETTAGE OF UTERUS    . DIRECT LARYNGOSCOPY Right 06/16/2017   Procedure: MICRO DIRECT LARYNGOSCOPY WITH BIOPSY OF RIGHT BASE OF TONGUE;   Surgeon: Carloyn Manner, MD;  Location: [facility];  Service: ENT;  Laterality: Right;  . EP IMPLANTABLE DEVICE N/A 06/04/2015   Procedure: PPM Generator Changeout;  Surgeon: Deboraha Sprang, MD;  Location: [facility];  Service: Cardiovascular;  Laterality: N/A;  . ESOPHAGOGASTRODUODENOSCOPY (EGD) WITH PROPOFOL N/A 12/18/2015   Procedure: ESOPHAGOGASTRODUODENOSCOPY (EGD) WITH gastric biopsy and dilation;  Surgeon: Lucilla Lame, MD;  Location: [facility];  Service: Endoscopy;  Laterality: N/A;  . EYE SURGERY    . INSERT / REPLACE / REMOVE PACEMAKER    . IR FLUORO GUIDE PORT INSERTION RIGHT  06/22/2017  . PACEMAKER INSERTION  2006   [employer] Adapta ADDR01  . TONSILLECTOMY    . TONSILLECTOMY    . VAGINAL HYSTERECTOMY  2013   mad/cope    Social History  Socioeconomic History  . Marital status: Married    Spouse name: Not on file  . Number of children: Not on file  . Years of education: Not on file  . Highest education level: Not on file  Social Needs  . Financial resource strain: Not on file  . Food insecurity - worry: Not on file  . Food insecurity - inability: Not on file  . Transportation needs - medical: Not on file  . Transportation needs - non-medical: Not on file  Occupational History  . Not on file  Tobacco Use  . Smoking status: Never Smoker  . Smokeless tobacco: Never Used  . Tobacco comment: tobacco use -no  Substance and Sexual Activity  . Alcohol use: No  . Drug use: No  . Sexual activity: Yes    Birth control/protection: Surgical  Other Topics Concern  . Not on file  Social History Narrative   Lives in [place] with husband.  Works out regularly.  Works as a Geophysicist/field seismologist - owns own business.     Family History  Problem Relation Age of Onset  . COPD Mother        alive @ 68  . Atrial fibrillation Mother   . Stroke Father        died @ 57  . Breast cancer Maternal Aunt 70  . COPD Maternal Grandmother   . Ovarian cancer Neg  Hx   . Diabetes Neg Hx      Current Outpatient Medications:  .  aspirin 81 MG tablet, Take 81 mg by mouth daily., Disp: , Rfl:  .  b complex vitamins tablet, Take 1 tablet by mouth daily., Disp: , Rfl:  .  BLACK ELDERBERRY,BERRY-FLOWER, PO, Take 1 capsule by mouth daily., Disp: , Rfl:  .  CRANBERRY SOFT PO, Take 2 each by mouth daily. , Disp: , Rfl:  .  dexamethasone (DECADRON) 4 MG tablet, Take 2 tablets by mouth once a day on the day after chemotherapy and then take 2 tablets two times a day for 2 days. Take with food., Disp: 30 tablet, Rfl: 1 .  DHEA 10 MG TABS, Take 10 mg by mouth every morning., Disp: 90 tablet, Rfl: 3 .  Digestive Enzymes (DIGESTIVE ENZYME PO), Take 1-2 capsules by mouth daily., Disp: , Rfl:  .  fexofenadine (ALLEGRA) 60 MG tablet, Take 60 mg by mouth daily., Disp: , Rfl:  .  fluticasone (FLONASE SENSIMIST) 27.5 MCG/SPRAY nasal spray, Place 1 spray into the nose daily. , Disp: , Rfl:  .  HYDROcodone-acetaminophen (HYCET) 7.5-325 mg/15 ml solution, Take 10 mLs by mouth 4 (four) times daily as needed for moderate pain., Disp: 300 mL, Rfl: 0 .  ibuprofen (ADVIL,MOTRIN) 200 MG tablet, Takes 2-3 tablets as needed., Disp: , Rfl:  .  lidocaine-prilocaine (EMLA) cream, Apply to affected area once, Disp: 30 g, Rfl: 3 .  loratadine (CLARITIN) 10 MG tablet, Take 10 mg by mouth daily., Disp: , Rfl:  .  LORazepam (ATIVAN) 0.5 MG tablet, Take 1 tablet (0.5 mg total) by mouth every 6 (six) hours as needed (Nausea or vomiting). (Patient not taking: Reported on 06/22/2017), Disp: 30 tablet, Rfl: 0 .  nitroGLYCERIN (NITROSTAT) 0.4 MG SL tablet, Place 1 tablet (0.4 mg total) under the tongue every 5 (five) minutes as needed for chest pain. (Patient not taking: Reported on 06/22/2017), Disp: 25 tablet, Rfl: 3 .  NON FORMULARY, Take by mouth daily. Bone Up (calcium) , Disp: , Rfl:  .  ondansetron (  ZOFRAN) 4 MG tablet, Take 1 tablet (4 mg total) by mouth every 8 (eight) hours as needed for up  to 10 doses for nausea or vomiting., Disp: 20 tablet, Rfl: 0 .  ondansetron (ZOFRAN) 8 MG tablet, Take 1 tablet (8 mg total) by mouth 2 (two) times daily as needed. Start on the third day after chemotherapy., Disp: 30 tablet, Rfl: 1 .  PRESCRIPTION MEDICATION, Place 1 tablet vaginally 3 (three) times a week. E3 1mg  and Testosterone 1mg  compound, Disp: , Rfl:  .  prochlorperazine (COMPAZINE) 10 MG tablet, Take 1 tablet (10 mg total) by mouth every 6 (six) hours as needed (Nausea or vomiting)., Disp: 30 tablet, Rfl: 1  Physical exam:  Vitals:   07/08/17 0932  BP: 128/84  Pulse: 62  Resp: 20  Temp: (!) 97.1 F (36.2 C)  TempSrc: Tympanic   Physical Exam  Constitutional: She is oriented to person, place, and time and well-developed, well-nourished, and in no distress.  HENT:  Head: Normocephalic and atraumatic.  Eyes: EOM are normal. Pupils are equal, round, and reactive to light.  Neck: Normal range of motion.  Cardiovascular: Normal rate, regular rhythm and normal heart sounds.  Pulmonary/Chest: Effort normal and breath sounds normal.  Abdominal: Soft. Bowel sounds are normal.  Lymphadenopathy:  Palpable right cervical level II node  Neurological: She is alert and oriented to person, place, and time.  Skin: Skin is warm and dry.     CMP Latest Ref Rng & Units 07/08/2017  Glucose 65 - 99 mg/dL 100(H)  BUN 6 - 20 mg/dL 18  Creatinine 0.44 - 1.00 mg/dL 0.62  Sodium 135 - 145 mmol/L 140  Potassium 3.5 - 5.1 mmol/L 3.9  Chloride 101 - 111 mmol/L 108  CO2 22 - 32 mmol/L 26  Calcium 8.9 - 10.3 mg/dL 9.1  Total Protein 6.5 - 8.1 g/dL 6.6  Total Bilirubin 0.3 - 1.2 mg/dL 1.2  Alkaline Phos 38 - 126 U/L 78  AST 15 - 41 U/L 39  ALT 14 - 54 U/L 45   CBC Latest Ref Rng & Units 07/08/2017  WBC 3.6 - 11.0 K/uL 4.4  Hemoglobin 12.0 - 16.0 g/dL 13.4  Hematocrit 35.0 - 47.0 % 39.4  Platelets 150 - 440 K/uL 144(L)    No images are attached to the encounter.  Nm Pet Image Initial (pi)  Skull Base To Thigh  Addendum Date: 06/16/2017   ADDENDUM REPORT: 06/16/2017 11:35 ADDENDUM: Correction: In the NECK FINDING section: "Enlarged RIGHT level 2 lymph node measuring 20 mm short axis" Electronically Signed   By: Suzy Bouchard M.D.   On: 06/16/2017 11:35   Result Date: 06/16/2017 CLINICAL DATA:  Initial treatment strategy for lymphadenopathy. EXAM: NUCLEAR MEDICINE PET SKULL BASE TO THIGH TECHNIQUE: 12.9 mCi F-18 FDG was injected intravenously. Full-ring PET imaging was performed from the skull base to thigh after the radiotracer. CT data was obtained and used for attenuation correction and anatomic localization. FASTING BLOOD GLUCOSE:  Value: 91 mg/dl COMPARISON:  None. FINDINGS: NECK The enlarged LEFT level 2 lymph node measuring 20 mm short axis which has intense metabolic activity SUV max equal 11.1. No clear primary malignancy identified within the oropharynx or hypopharynx. Mild asymmetric metabolic activity at RIGHT base of tongue with SUV max equal 4.5. Mile focal activity associated with posterior aspect of the glottis with SUV max equal 4.0. No mass lesion identified (image 245 ). CHEST No hypermetabolic mediastinal or hilar nodes. No suspicious pulmonary nodules on the CT scan.  ABDOMEN/PELVIS No abnormal hypermetabolic activity within the liver, pancreas, adrenal glands, or spleen. No hypermetabolic lymph nodes in the abdomen or pelvis. Bilateral benign adrenal adenomas post hysterectomy. Sigmoid diverticulosis SKELETON No focal hypermetabolic activity to suggest skeletal metastasis. IMPRESSION: 1. Intensely hypermetabolic enlarged lymph node in the RIGHT level II nodal station consistent with metastatic squamous cell carcinoma. 2. No clear site of primary malignancy within the head neck. Potential site of investigation would include the RIGHT base of tongue and a less suspicious region in the posterior glottis. 3. No evidence distant metastatic disease Electronically Signed: By:  Suzy Bouchard M.D. On: 06/09/2017 15:49   Ir Fluoro Guide Port Insertion Right  Result Date: 06/22/2017 CLINICAL DATA:  Metastatic squamous cell carcinoma to right cervical lymph node. Needs durable venous access for chemotherapy regimen. EXAM: TUNNELED PORT CATHETER PLACEMENT WITH ULTRASOUND AND FLUOROSCOPIC GUIDANCE FLUOROSCOPY TIME:  0.1 minute, 17 uGym2 DAP ANESTHESIA/SEDATION: Intravenous Fentanyl and Versed were administered as conscious sedation during continuous monitoring of the patient's level of consciousness and physiological / cardiorespiratory status by the radiology RN, with a total moderate sedation time of 13 minutes. TECHNIQUE: The procedure, risks, benefits, and alternatives were explained to the patient. Questions regarding the procedure were encouraged and answered. The patient understands and consents to the procedure. As antibiotic prophylaxis, cefazolin/vancomycin was ordered pre-procedure and administered intravenously within one hour of incision. Patency of the right IJ vein was confirmed with ultrasound with image documentation. An appropriate skin site was determined. Skin site was marked. Region was prepped using maximum barrier technique including cap and mask, sterile gown, sterile gloves, large sterile sheet, and Chlorhexidine as cutaneous antisepsis. The region was infiltrated locally with 1% lidocaine. Under real-time ultrasound guidance, the right IJ vein was accessed with a 21 gauge micropuncture needle; the needle tip within the vein was confirmed with ultrasound image documentation. Needle was exchanged over a 018 guidewire for transitional dilator which allowed passage of the [facility] wire into the IVC. Over this, the transitional dilator was exchanged for a 5 [place] MPA catheter. A small incision was made on the right anterior chest wall and a subcutaneous pocket fashioned. The power-injectable port was positioned and its catheter tunneled to the right IJ dermatotomy  site. The MPA catheter was exchanged over an Amplatz wire for a peel-away sheath, through which the port catheter, which had been trimmed to the appropriate length, was advanced and positioned under fluoroscopy with its tip at the cavoatrial junction. Spot chest radiograph confirms good catheter position and no pneumothorax. The pocket was closed with deep interrupted and subcuticular continuous 3-0 Monocryl sutures. The port was flushed per protocol. The incisions were covered with Dermabond then covered with a sterile dressing. COMPLICATIONS: COMPLICATIONS None immediate IMPRESSION: Technically successful right IJ power-injectable port catheter placement. Ready for routine use. Electronically Signed   By: Lucrezia Europe M.D.   On: 06/22/2017 14:48     Assessment and plan- Patient is a 61 y.o. 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 She will begin cycle 1 week 1 of cisplatin today.  Again discussed risks and benefits of cisplatin including all but not limited to nausea, vomiting, fatigue, low blood counts, risk of infections, risk of peripheral neuropathy kidney dysfunction and hearing loss.  Patient understands and agrees to proceed as planned.   Emphasized the importance of keeping  up with oral intake and hydration. She will call us if she has any questions or concerns   I will see her back in 1 week's time with a CBC and CMP prior to cycle #2 of cisplatin  Cancer Staging Squamous cell carcinoma of oropharynx ([place]) Staging form: Pharynx - HPV-Mediated Oropharynx, AJCC 8th Edition - Clinical stage from 07/08/2017: Stage I (cT1, cN1, cM0, p16: Positive) - Signed by Sindy Guadeloupe, MD on 07/08/2017     Visit Diagnosis 1. Squamous cell carcinoma of oropharynx ([place])   2. Encounter for antineoplastic chemotherapy      Dr. Randa Evens, MD,  [facility] Pager- [phone] 07/08/2017 9:50 AM

## 2017-07-11 ENCOUNTER — Other Ambulatory Visit: Payer: Self-pay | Admitting: Oncology

## 2017-07-11 ENCOUNTER — Ambulatory Visit
Admission: RE | Admit: 2017-07-11 | Discharge: 2017-07-11 | Disposition: A | Discharge status: Home / Self Care | Payer: BLUE CROSS/BLUE SHIELD | Source: Ambulatory Visit | Attending: Radiation Oncology | Admitting: Radiation Oncology

## 2017-07-11 DIAGNOSIS — R079 Chest pain, unspecified: Secondary | ICD-10-CM | POA: Diagnosis not present

## 2017-07-11 DIAGNOSIS — R4701 Aphasia: Secondary | ICD-10-CM | POA: Diagnosis not present

## 2017-07-11 DIAGNOSIS — Z7982 Long term (current) use of aspirin: Secondary | ICD-10-CM | POA: Diagnosis not present

## 2017-07-11 DIAGNOSIS — F419 Anxiety disorder, unspecified: Secondary | ICD-10-CM | POA: Diagnosis not present

## 2017-07-11 DIAGNOSIS — Z79899 Other long term (current) drug therapy: Secondary | ICD-10-CM | POA: Diagnosis not present

## 2017-07-11 DIAGNOSIS — K219 Gastro-esophageal reflux disease without esophagitis: Secondary | ICD-10-CM | POA: Diagnosis not present

## 2017-07-11 DIAGNOSIS — I714 Abdominal aortic aneurysm, without rupture: Secondary | ICD-10-CM | POA: Diagnosis not present

## 2017-07-11 DIAGNOSIS — Z510 Encounter for antineoplastic radiation therapy: Secondary | ICD-10-CM | POA: Diagnosis not present

## 2017-07-11 DIAGNOSIS — I959 Hypotension, unspecified: Secondary | ICD-10-CM | POA: Diagnosis not present

## 2017-07-11 DIAGNOSIS — F40240 Claustrophobia: Secondary | ICD-10-CM | POA: Diagnosis not present

## 2017-07-11 DIAGNOSIS — Z803 Family history of malignant neoplasm of breast: Secondary | ICD-10-CM | POA: Diagnosis not present

## 2017-07-11 DIAGNOSIS — C01 Malignant neoplasm of base of tongue: Secondary | ICD-10-CM | POA: Diagnosis not present

## 2017-07-11 DIAGNOSIS — F329 Major depressive disorder, single episode, unspecified: Secondary | ICD-10-CM | POA: Diagnosis not present

## 2017-07-11 DIAGNOSIS — Z950 Presence of cardiac pacemaker: Secondary | ICD-10-CM | POA: Diagnosis not present

## 2017-07-11 DIAGNOSIS — Q231 Congenital insufficiency of aortic valve: Secondary | ICD-10-CM | POA: Diagnosis not present

## 2017-07-11 LAB — CUP PACEART INCLINIC DEVICE CHECK
Battery Impedance: 139 Ohm
Battery Remaining Longevity: 159 mo
Brady Statistic AP VP Percent: 0 %
Implantable Lead Implant Date: 20061128
Implantable Lead Location: 753860
Implantable Lead Model: 4092
Implantable Lead Model: 4592
Implantable Pulse Generator Implant Date: 20161123
Lead Channel Pacing Threshold Amplitude: 0.5 V
Lead Channel Sensing Intrinsic Amplitude: 4 mV
Lead Channel Setting Pacing Amplitude: 2.75 V
Lead Channel Setting Pacing Pulse Width: 0.4 ms
MDC IDC LEAD IMPLANT DT: 20061128
MDC IDC LEAD LOCATION: 753859
MDC IDC MSMT BATTERY VOLTAGE: 2.79 V
MDC IDC MSMT LEADCHNL RA IMPEDANCE VALUE: 522 Ohm
MDC IDC MSMT LEADCHNL RA PACING THRESHOLD PULSEWIDTH: 0.4 ms
MDC IDC MSMT LEADCHNL RV IMPEDANCE VALUE: 726 Ohm
MDC IDC MSMT LEADCHNL RV PACING THRESHOLD AMPLITUDE: 1 V
MDC IDC MSMT LEADCHNL RV PACING THRESHOLD PULSEWIDTH: 0.4 ms
MDC IDC MSMT LEADCHNL RV SENSING INTR AMPL: 15.67 mV
MDC IDC SESS DTM: 20181204151549
MDC IDC SET LEADCHNL RA PACING AMPLITUDE: 2 V
MDC IDC SET LEADCHNL RV SENSING SENSITIVITY: 4 mV
MDC IDC STAT BRADY AP VS PERCENT: 11 %
MDC IDC STAT BRADY AS VP PERCENT: 0 %
MDC IDC STAT BRADY AS VS PERCENT: 89 %

## 2017-07-13 ENCOUNTER — Other Ambulatory Visit: Payer: Self-pay | Admitting: *Deleted

## 2017-07-13 ENCOUNTER — Ambulatory Visit
Admission: RE | Admit: 2017-07-13 | Discharge: 2017-07-13 | Disposition: A | Discharge status: Home / Self Care | Payer: BLUE CROSS/BLUE SHIELD | Source: Ambulatory Visit | Attending: Radiation Oncology | Admitting: Radiation Oncology

## 2017-07-13 DIAGNOSIS — Z803 Family history of malignant neoplasm of breast: Secondary | ICD-10-CM | POA: Diagnosis not present

## 2017-07-13 DIAGNOSIS — I714 Abdominal aortic aneurysm, without rupture: Secondary | ICD-10-CM | POA: Diagnosis not present

## 2017-07-13 DIAGNOSIS — I959 Hypotension, unspecified: Secondary | ICD-10-CM | POA: Diagnosis not present

## 2017-07-13 DIAGNOSIS — C109 Malignant neoplasm of oropharynx, unspecified: Secondary | ICD-10-CM

## 2017-07-13 DIAGNOSIS — Z7982 Long term (current) use of aspirin: Secondary | ICD-10-CM | POA: Diagnosis not present

## 2017-07-13 DIAGNOSIS — R4701 Aphasia: Secondary | ICD-10-CM | POA: Diagnosis not present

## 2017-07-13 DIAGNOSIS — F40240 Claustrophobia: Secondary | ICD-10-CM | POA: Diagnosis not present

## 2017-07-13 DIAGNOSIS — Z79899 Other long term (current) drug therapy: Secondary | ICD-10-CM | POA: Diagnosis not present

## 2017-07-13 DIAGNOSIS — Z510 Encounter for antineoplastic radiation therapy: Secondary | ICD-10-CM | POA: Diagnosis not present

## 2017-07-13 DIAGNOSIS — C01 Malignant neoplasm of base of tongue: Secondary | ICD-10-CM | POA: Diagnosis not present

## 2017-07-13 DIAGNOSIS — F419 Anxiety disorder, unspecified: Secondary | ICD-10-CM | POA: Diagnosis not present

## 2017-07-13 DIAGNOSIS — Z950 Presence of cardiac pacemaker: Secondary | ICD-10-CM | POA: Diagnosis not present

## 2017-07-13 DIAGNOSIS — K219 Gastro-esophageal reflux disease without esophagitis: Secondary | ICD-10-CM | POA: Diagnosis not present

## 2017-07-13 DIAGNOSIS — F329 Major depressive disorder, single episode, unspecified: Secondary | ICD-10-CM | POA: Diagnosis not present

## 2017-07-13 DIAGNOSIS — Q231 Congenital insufficiency of aortic valve: Secondary | ICD-10-CM | POA: Diagnosis not present

## 2017-07-13 DIAGNOSIS — R079 Chest pain, unspecified: Secondary | ICD-10-CM | POA: Diagnosis not present

## 2017-07-14 ENCOUNTER — Inpatient Hospital Stay: Payer: BLUE CROSS/BLUE SHIELD | Attending: Radiation Oncology

## 2017-07-14 ENCOUNTER — Ambulatory Visit
Admission: RE | Admit: 2017-07-14 | Discharge: 2017-07-14 | Disposition: A | Discharge status: Home / Self Care | Payer: BLUE CROSS/BLUE SHIELD | Source: Ambulatory Visit | Attending: Radiation Oncology | Admitting: Radiation Oncology

## 2017-07-14 DIAGNOSIS — F419 Anxiety disorder, unspecified: Secondary | ICD-10-CM | POA: Insufficient documentation

## 2017-07-14 DIAGNOSIS — C109 Malignant neoplasm of oropharynx, unspecified: Secondary | ICD-10-CM | POA: Insufficient documentation

## 2017-07-14 DIAGNOSIS — R5383 Other fatigue: Secondary | ICD-10-CM | POA: Insufficient documentation

## 2017-07-14 DIAGNOSIS — F329 Major depressive disorder, single episode, unspecified: Secondary | ICD-10-CM | POA: Diagnosis not present

## 2017-07-14 DIAGNOSIS — Z7982 Long term (current) use of aspirin: Secondary | ICD-10-CM | POA: Diagnosis not present

## 2017-07-14 DIAGNOSIS — Q231 Congenital insufficiency of aortic valve: Secondary | ICD-10-CM | POA: Diagnosis not present

## 2017-07-14 DIAGNOSIS — J019 Acute sinusitis, unspecified: Secondary | ICD-10-CM | POA: Insufficient documentation

## 2017-07-14 DIAGNOSIS — F40240 Claustrophobia: Secondary | ICD-10-CM | POA: Diagnosis not present

## 2017-07-14 DIAGNOSIS — E860 Dehydration: Secondary | ICD-10-CM | POA: Insufficient documentation

## 2017-07-14 DIAGNOSIS — R4701 Aphasia: Secondary | ICD-10-CM | POA: Diagnosis not present

## 2017-07-14 DIAGNOSIS — Z79899 Other long term (current) drug therapy: Secondary | ICD-10-CM | POA: Diagnosis not present

## 2017-07-14 DIAGNOSIS — R531 Weakness: Secondary | ICD-10-CM | POA: Insufficient documentation

## 2017-07-14 DIAGNOSIS — K219 Gastro-esophageal reflux disease without esophagitis: Secondary | ICD-10-CM | POA: Insufficient documentation

## 2017-07-14 DIAGNOSIS — Z510 Encounter for antineoplastic radiation therapy: Secondary | ICD-10-CM | POA: Diagnosis not present

## 2017-07-14 DIAGNOSIS — D696 Thrombocytopenia, unspecified: Secondary | ICD-10-CM | POA: Insufficient documentation

## 2017-07-14 DIAGNOSIS — Z5111 Encounter for antineoplastic chemotherapy: Secondary | ICD-10-CM | POA: Insufficient documentation

## 2017-07-14 DIAGNOSIS — B370 Candidal stomatitis: Secondary | ICD-10-CM | POA: Insufficient documentation

## 2017-07-14 DIAGNOSIS — R634 Abnormal weight loss: Secondary | ICD-10-CM | POA: Insufficient documentation

## 2017-07-14 DIAGNOSIS — Z950 Presence of cardiac pacemaker: Secondary | ICD-10-CM | POA: Insufficient documentation

## 2017-07-14 DIAGNOSIS — I959 Hypotension, unspecified: Secondary | ICD-10-CM | POA: Diagnosis not present

## 2017-07-14 DIAGNOSIS — R040 Epistaxis: Secondary | ICD-10-CM | POA: Insufficient documentation

## 2017-07-14 DIAGNOSIS — I714 Abdominal aortic aneurysm, without rupture: Secondary | ICD-10-CM | POA: Diagnosis not present

## 2017-07-14 DIAGNOSIS — G4700 Insomnia, unspecified: Secondary | ICD-10-CM | POA: Insufficient documentation

## 2017-07-14 DIAGNOSIS — C770 Secondary and unspecified malignant neoplasm of lymph nodes of head, face and neck: Secondary | ICD-10-CM | POA: Insufficient documentation

## 2017-07-14 DIAGNOSIS — C01 Malignant neoplasm of base of tongue: Secondary | ICD-10-CM | POA: Diagnosis not present

## 2017-07-14 DIAGNOSIS — R200 Anesthesia of skin: Secondary | ICD-10-CM | POA: Insufficient documentation

## 2017-07-14 DIAGNOSIS — R079 Chest pain, unspecified: Secondary | ICD-10-CM | POA: Diagnosis not present

## 2017-07-14 DIAGNOSIS — Z803 Family history of malignant neoplasm of breast: Secondary | ICD-10-CM | POA: Diagnosis not present

## 2017-07-14 NOTE — Progress Notes (Signed)
Nutrition Follow-up:  Patient with head and neck cancer likely oropharynx base of the tongue HPV positive stage I.    Met with patient following radiation therapy today.  Patient has completed first cycle of chemotherapy on 12/28.  Patient reports eating eggs bacon with 1/2 bowel oatmeal this am.  Feels more nauseated today vs over the weekend.  Had trouble with constipation over the weekend and took miralax and caused loose stool, had more normal stool this am.  Reports fatigue as well that started yesterday.  Reports just today does not really want to eat that much. Mother has been preparing deviled eggs and planning to prepare chicken salad today for patient to eat.  On New Year ate ham, collards, green beans, deviled eggs and tolerated well.  Medications: reviewed  Labs: reviewed  Anthropometrics:   Reports weight this week of 180 lb in radiation   NUTRITION DIAGNOSIS: Predicted suboptimal energy intake continues    MALNUTRITION DIAGNOSIS: continue to monitor   INTERVENTION:   Encouraged patient to take nausea medication as prescribed and if not working to discuss with oncologist.   Encouraged small frequent meals to help with nausea as well.   Discussed strategies to help with constipation.   Family helping with meal preparation as patient with fatigue. Patient with limited snack/meal options in infusion room and will contact Food Service Director for options for patient.  Patient currently packing own snacks at this time.   MONITORING, EVALUATION, GOAL: weight trends, intake   NEXT VISIT: Jan 17 following radiation  Kurtiss Wence B. [name], [place], [place] Registered Dietitian [phone] (pager)

## 2017-07-15 ENCOUNTER — Inpatient Hospital Stay (HOSPITAL_BASED_OUTPATIENT_CLINIC_OR_DEPARTMENT_OTHER): Payer: BLUE CROSS/BLUE SHIELD | Admitting: Oncology

## 2017-07-15 ENCOUNTER — Inpatient Hospital Stay: Payer: BLUE CROSS/BLUE SHIELD | Admitting: *Deleted

## 2017-07-15 ENCOUNTER — Inpatient Hospital Stay: Payer: BLUE CROSS/BLUE SHIELD

## 2017-07-15 ENCOUNTER — Ambulatory Visit
Admission: RE | Admit: 2017-07-15 | Discharge: 2017-07-15 | Disposition: A | Discharge status: Home / Self Care | Payer: BLUE CROSS/BLUE SHIELD | Source: Ambulatory Visit | Attending: Radiation Oncology | Admitting: Radiation Oncology

## 2017-07-15 ENCOUNTER — Encounter: Payer: Self-pay | Admitting: Oncology

## 2017-07-15 VITALS — BP 104/72 | HR 78 | Temp 98.0°F | Resp 18 | Ht 64.0 in | Wt 173.8 lb

## 2017-07-15 DIAGNOSIS — C01 Malignant neoplasm of base of tongue: Secondary | ICD-10-CM | POA: Diagnosis not present

## 2017-07-15 DIAGNOSIS — R4701 Aphasia: Secondary | ICD-10-CM | POA: Diagnosis not present

## 2017-07-15 DIAGNOSIS — Z803 Family history of malignant neoplasm of breast: Secondary | ICD-10-CM | POA: Diagnosis not present

## 2017-07-15 DIAGNOSIS — J019 Acute sinusitis, unspecified: Secondary | ICD-10-CM | POA: Diagnosis not present

## 2017-07-15 DIAGNOSIS — Z7982 Long term (current) use of aspirin: Secondary | ICD-10-CM | POA: Diagnosis not present

## 2017-07-15 DIAGNOSIS — R634 Abnormal weight loss: Secondary | ICD-10-CM | POA: Diagnosis not present

## 2017-07-15 DIAGNOSIS — R200 Anesthesia of skin: Secondary | ICD-10-CM

## 2017-07-15 DIAGNOSIS — F40240 Claustrophobia: Secondary | ICD-10-CM | POA: Diagnosis not present

## 2017-07-15 DIAGNOSIS — I959 Hypotension, unspecified: Secondary | ICD-10-CM | POA: Diagnosis not present

## 2017-07-15 DIAGNOSIS — Z79899 Other long term (current) drug therapy: Secondary | ICD-10-CM | POA: Diagnosis not present

## 2017-07-15 DIAGNOSIS — R5383 Other fatigue: Secondary | ICD-10-CM

## 2017-07-15 DIAGNOSIS — R531 Weakness: Secondary | ICD-10-CM

## 2017-07-15 DIAGNOSIS — C109 Malignant neoplasm of oropharynx, unspecified: Secondary | ICD-10-CM | POA: Diagnosis not present

## 2017-07-15 DIAGNOSIS — E860 Dehydration: Secondary | ICD-10-CM | POA: Diagnosis not present

## 2017-07-15 DIAGNOSIS — Q231 Congenital insufficiency of aortic valve: Secondary | ICD-10-CM | POA: Diagnosis not present

## 2017-07-15 DIAGNOSIS — Z5111 Encounter for antineoplastic chemotherapy: Secondary | ICD-10-CM

## 2017-07-15 DIAGNOSIS — R079 Chest pain, unspecified: Secondary | ICD-10-CM | POA: Diagnosis not present

## 2017-07-15 DIAGNOSIS — G4700 Insomnia, unspecified: Secondary | ICD-10-CM | POA: Diagnosis not present

## 2017-07-15 DIAGNOSIS — D696 Thrombocytopenia, unspecified: Secondary | ICD-10-CM | POA: Diagnosis not present

## 2017-07-15 DIAGNOSIS — Z510 Encounter for antineoplastic radiation therapy: Secondary | ICD-10-CM | POA: Diagnosis not present

## 2017-07-15 DIAGNOSIS — B370 Candidal stomatitis: Secondary | ICD-10-CM | POA: Diagnosis not present

## 2017-07-15 DIAGNOSIS — F419 Anxiety disorder, unspecified: Secondary | ICD-10-CM

## 2017-07-15 DIAGNOSIS — F329 Major depressive disorder, single episode, unspecified: Secondary | ICD-10-CM | POA: Diagnosis not present

## 2017-07-15 DIAGNOSIS — Z950 Presence of cardiac pacemaker: Secondary | ICD-10-CM | POA: Diagnosis not present

## 2017-07-15 DIAGNOSIS — R040 Epistaxis: Secondary | ICD-10-CM | POA: Diagnosis not present

## 2017-07-15 DIAGNOSIS — K219 Gastro-esophageal reflux disease without esophagitis: Secondary | ICD-10-CM

## 2017-07-15 DIAGNOSIS — C770 Secondary and unspecified malignant neoplasm of lymph nodes of head, face and neck: Secondary | ICD-10-CM | POA: Diagnosis not present

## 2017-07-15 DIAGNOSIS — I714 Abdominal aortic aneurysm, without rupture: Secondary | ICD-10-CM | POA: Diagnosis not present

## 2017-07-15 LAB — COMPREHENSIVE METABOLIC PANEL
ALT: 29 U/L (ref 14–54)
AST: 21 U/L (ref 15–41)
Albumin: 3.6 g/dL (ref 3.5–5.0)
Alkaline Phosphatase: 70 U/L (ref 38–126)
Anion gap: 7 (ref 5–15)
BILIRUBIN TOTAL: 1.5 mg/dL — AB (ref 0.3–1.2)
BUN: 17 mg/dL (ref 6–20)
CHLORIDE: 105 mmol/L (ref 101–111)
CO2: 25 mmol/L (ref 22–32)
Calcium: 8.6 mg/dL — ABNORMAL LOW (ref 8.9–10.3)
Creatinine, Ser: 0.7 mg/dL (ref 0.44–1.00)
Glucose, Bld: 99 mg/dL (ref 65–99)
Potassium: 4.1 mmol/L (ref 3.5–5.1)
Sodium: 137 mmol/L (ref 135–145)
TOTAL PROTEIN: 6.2 g/dL — AB (ref 6.5–8.1)

## 2017-07-15 LAB — CBC WITH DIFFERENTIAL/PLATELET
BASOS ABS: 0.1 10*3/uL (ref 0–0.1)
BASOS PCT: 1 %
EOS ABS: 0.1 10*3/uL (ref 0–0.7)
Eosinophils Relative: 2 %
HCT: 40.3 % (ref 35.0–47.0)
HEMOGLOBIN: 13.7 g/dL (ref 12.0–16.0)
LYMPHS ABS: 1 10*3/uL (ref 1.0–3.6)
Lymphocytes Relative: 15 %
MCH: 30.9 pg (ref 26.0–34.0)
MCHC: 33.9 g/dL (ref 32.0–36.0)
MCV: 91.2 fL (ref 80.0–100.0)
Monocytes Absolute: 0.7 10*3/uL (ref 0.2–0.9)
Monocytes Relative: 10 %
NEUTROS PCT: 72 %
Neutro Abs: 5 10*3/uL (ref 1.4–6.5)
Platelets: 144 10*3/uL — ABNORMAL LOW (ref 150–440)
RBC: 4.42 MIL/uL (ref 3.80–5.20)
RDW: 12.8 % (ref 11.5–14.5)
WBC: 6.9 10*3/uL (ref 3.6–11.0)

## 2017-07-15 MED ORDER — SODIUM CHLORIDE 0.9 % IV SOLN
Freq: Once | INTRAVENOUS | Status: AC
  Administered 2017-07-15: 12:00:00 via INTRAVENOUS
  Filled 2017-07-15: qty 5

## 2017-07-15 MED ORDER — POTASSIUM CHLORIDE 2 MEQ/ML IV SOLN
Freq: Once | INTRAVENOUS | Status: AC
  Administered 2017-07-15: 10:00:00 via INTRAVENOUS
  Filled 2017-07-15: qty 1000

## 2017-07-15 MED ORDER — SODIUM CHLORIDE 0.9 % IV SOLN
Freq: Once | INTRAVENOUS | Status: AC
  Administered 2017-07-15: 10:00:00 via INTRAVENOUS
  Filled 2017-07-15: qty 1000

## 2017-07-15 MED ORDER — HEPARIN SOD (PORK) LOCK FLUSH 100 UNIT/ML IV SOLN
500.0000 [IU] | Freq: Once | INTRAVENOUS | Status: AC | PRN
  Administered 2017-07-15: 500 [IU]
  Filled 2017-07-15: qty 5

## 2017-07-15 MED ORDER — PALONOSETRON HCL INJECTION 0.25 MG/5ML
0.2500 mg | Freq: Once | INTRAVENOUS | Status: AC
  Administered 2017-07-15: 0.25 mg via INTRAVENOUS
  Filled 2017-07-15: qty 5

## 2017-07-15 MED ORDER — SODIUM CHLORIDE 0.9 % IV SOLN
40.0000 mg/m2 | Freq: Once | INTRAVENOUS | Status: AC
  Administered 2017-07-15: 76 mg via INTRAVENOUS
  Filled 2017-07-15: qty 76

## 2017-07-15 NOTE — Progress Notes (Signed)
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 Consuela Mimes evaluation of right neck mass. The mass has been present for or a year and has been growing gradually. It was associated with some right ear pain but no fever. Patient has been a never smoker.Ultrasound on 05/30/2017 showed:Single enlarged lymph node in the right neck measures approximately 4.8 x 1.7 x 3.0 cm  2.Patient underwent IR guided lymph node biopsy of the cervical lymph node which showed metastatic squamous cell carcinoma. Staining for P 16 was positive and sox 10 was negative   3.PET/CT scan on 06/09/2017 showed intensely hypermetabolic enlarged right level 2 cervical lymph node with an SUV of 11.1. No clear site of primary malignancy within the head and neck. Potential site of investigation would include right base of tongue. There was asymmetric metabolic activity noted in that area with an SUV of 4.5. Less suspicious region in the posterior glottis. No evidence of distant metastatic disease.  4.Patient works as a Geophysicist/field seismologist at [employer]. Patient has had multiple allergies to medications in the past. Reports that when she took steroids in the past it made her quite aggravated. Her sister was hospitalized in a mental health facility after receiving a steroid course in the past.  5. Right tongue base biopsy positive for SCC. Plan is  to proceed with concurrent chemo/RT with cisplatin/RT. Baseline audiogram normal   Interval history- energy levels were good couple of days after chemo and declined after that for 2 days. Appetite is good. Mouth feels sore but she denies any pain or difficulty swallowing  ECOG PS- 0 Pain scale- 0   Review of systems- Review of Systems  Constitutional: Positive for malaise/fatigue. Negative for chills, fever and weight loss.  HENT: Negative for congestion, ear discharge and nosebleeds.   Eyes: Negative for blurred vision.  Respiratory: Negative for cough, hemoptysis, sputum production, shortness of breath and wheezing.   Cardiovascular: Negative for chest pain, palpitations, orthopnea and claudication.  Gastrointestinal: Negative for abdominal pain, blood in stool, constipation, diarrhea, heartburn, melena, nausea and vomiting.  Genitourinary: Negative for dysuria, flank pain, frequency, hematuria and urgency.  Musculoskeletal: Negative for back pain, joint pain and myalgias.  Skin: Negative for rash.  Neurological: Negative for dizziness, tingling, focal weakness, seizures, weakness and headaches.  Endo/Heme/Allergies: Does not bruise/bleed easily.  Psychiatric/Behavioral: Negative for depression and suicidal ideas. The patient does not have insomnia.      Allergies  Allergen Reactions  . Cefdinir Swelling    Swelling in mouth   . Cephalosporins Swelling  . Dairy Aid [Lactase] Swelling    Milk and milk products cause swelling and tingling of tongue  . Flagyl [Metronidazole] Swelling    Mouth and throat and ears go red and burn  . Lactose Intolerance (Gi) Other (See Comments)    GI upset.  Eats only gluten free  . Penicillins Rash    Rash/hives  . Phenylephrine-Guaifenesin Anaphylaxis    Night terrors  .  Sulfonamide Derivatives Rash    Rash/itching.  From head to her toes, the itching was horrible. Penicillin is the worst  . Tape Rash    Adhesives all cause a problem  severely.  It doesn't matter if it is paper tape or not.  Marland Kitchen Dexilant [Dexlansoprazole]     Pressure in head like head is in a vice and being squeezed  . Entex Lq [Phenylephrine-Guaifenesin]     Night terrors  . Other Other (See Comments)    Flu vaccine:  Weakness, dizziness, tia type symptoms.    . Oxycodone Nausea And Vomiting    And itching  . Succinylcholine Other (See Comments)    Trouble waking up  . Codeine Nausea And Vomiting  . Guaifenesin & Derivatives Other (See Comments)    Night terrors  . Lactobacillus Itching  . Naproxen Sodium     Itching/rash  . Pseudoephedrine Rash  . Sulfa Antibiotics Rash  . Tetracyclines & Related Other (See Comments)    Was taking flagyl at the same time; unsure which caused the swelling  Swelling of throat. Pharmacist states that he believes it was the flagyl and not the tetracycline  . Wheat Bran Other (See Comments)    Upset stomach. Now eats gluten free     Past Medical History:  Diagnosis Date  . AA (aortic aneurysm) ([place])    a. 04/2011 Echo: Ao Root: 4.1cm, Asc Ao 4.7cm.  . Anxiety   . AV block, 2nd degree    a. 05/2005 - s/p MDT Adapta ADDR01 Dual Chamber PPM  . Bicuspid aortic valve   . Cancer ([place])   . Chest pain    a. 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  . Depression   . Expressive aphasia    a. ongoing since 04/2011 - seen by neurology - ? TIA vs. Migraine  . Facial numbness    a. ongoing since 04/2011 - seen by neurology - ? TIA vs. Migraine  . GERD (gastroesophageal reflux disease)   . Low blood pressure   . Presence of permanent cardiac pacemaker   . 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 Syncope    a. 04/2011 Echo: EF 55-65%, No RWMA, Gr 1 DD.     Past Surgical History:  Procedure Laterality Date  . ABDOMINAL WALL MESH  REMOVAL    . bladder tack    . cataract Bilateral 2004  . CHOLECYSTECTOMY  2000  . DILATION AND CURETTAGE OF UTERUS    . DIRECT LARYNGOSCOPY Right 06/16/2017   Procedure:  MICRO DIRECT LARYNGOSCOPY WITH BIOPSY OF RIGHT BASE OF TONGUE;  Surgeon: Carloyn Manner, MD;  Location: [facility];  Service: ENT;  Laterality: Right;  . EP IMPLANTABLE DEVICE N/A 06/04/2015   Procedure: PPM Generator Changeout;  Surgeon: Deboraha Sprang, MD;  Location: [facility];  Service: Cardiovascular;  Laterality: N/A;  . ESOPHAGOGASTRODUODENOSCOPY (EGD) WITH PROPOFOL N/A 12/18/2015   Procedure: ESOPHAGOGASTRODUODENOSCOPY (EGD) WITH gastric biopsy and dilation;  Surgeon: Lucilla Lame, MD;  Location: [facility];  Service: Endoscopy;  Laterality: N/A;  . EYE SURGERY    . INSERT / REPLACE / REMOVE PACEMAKER    . IR FLUORO GUIDE PORT INSERTION RIGHT  06/22/2017  . PACEMAKER INSERTION  2006   [employer] Adapta ADDR01  . TONSILLECTOMY    . TONSILLECTOMY    . VAGINAL HYSTERECTOMY  2013   mad/cope    Social History   Socioeconomic History  . Marital status: Married  Spouse name: Not on file  . Number of children: Not on file  . Years of education: Not on file  . Highest education level: Not on file  Social Needs  . Financial resource strain: Not on file  . Food insecurity - worry: Not on file  . Food insecurity - inability: Not on file  . Transportation needs - medical: Not on file  . Transportation needs - non-medical: Not on file  Occupational History  . Not on file  Tobacco Use  . Smoking status: Never Smoker  . Smokeless tobacco: Never Used  . Tobacco comment: tobacco use -no  Substance and Sexual Activity  . Alcohol use: No  . Drug use: No  . Sexual activity: Yes    Birth control/protection: Surgical  Other Topics Concern  . Not on file  Social History Narrative   Lives in [place] with husband.  Works out regularly.  Works as a Geophysicist/field seismologist - owns own business.     Family History  Problem Relation Age of Onset  . COPD Mother        alive @ 21  . Atrial fibrillation Mother   . Stroke Father        died @ 33  . Breast cancer Maternal  Aunt 70  . COPD Maternal Grandmother   . Ovarian cancer Neg Hx   . Diabetes Neg Hx      Current Outpatient Medications:  .  aspirin 81 MG tablet, Take 81 mg by mouth daily., Disp: , Rfl:  .  b complex vitamins tablet, Take 1 tablet by mouth daily., Disp: , Rfl:  .  BLACK ELDERBERRY,BERRY-FLOWER, PO, Take 1 capsule by mouth daily., Disp: , Rfl:  .  CRANBERRY SOFT PO, Take 2 each by mouth daily. , Disp: , Rfl:  .  dexamethasone (DECADRON) 4 MG tablet, Take 2 tablets by mouth once a day on the day after chemotherapy and then take 2 tablets two times a day for 2 days. Take with food., Disp: 30 tablet, Rfl: 1 .  Digestive Enzymes (DIGESTIVE ENZYME PO), Take 1-2 capsules by mouth daily., Disp: , Rfl:  .  HYDROcodone-acetaminophen (HYCET) 7.5-325 mg/15 ml solution, Take 10 mLs by mouth 4 (four) times daily as needed for moderate pain., Disp: 300 mL, Rfl: 0 .  ibuprofen (ADVIL,MOTRIN) 200 MG tablet, Takes 2-3 tablets as needed., Disp: , Rfl:  .  lidocaine-prilocaine (EMLA) cream, Apply to affected area once, Disp: 30 g, Rfl: 3 .  loratadine (CLARITIN) 10 MG tablet, Take 10 mg by mouth daily as needed. , Disp: , Rfl:  .  LORazepam (ATIVAN) 0.5 MG tablet, Take 1 tablet (0.5 mg total) by mouth every 6 (six) hours as needed (Nausea or vomiting)., Disp: 30 tablet, Rfl: 0 .  nitroGLYCERIN (NITROSTAT) 0.4 MG SL tablet, Place 1 tablet (0.4 mg total) under the tongue every 5 (five) minutes as needed for chest pain., Disp: 25 tablet, Rfl: 3 .  NON FORMULARY, Take by mouth daily. Bone Up (calcium) , Disp: , Rfl:  .  ondansetron (ZOFRAN) 4 MG tablet, Take 1 tablet (4 mg total) by mouth every 8 (eight) hours as needed for up to 10 doses for nausea or vomiting., Disp: 20 tablet, Rfl: 0 .  ondansetron (ZOFRAN) 8 MG tablet, Take 1 tablet (8 mg total) by mouth 2 (two) times daily as needed. Start on the third day after chemotherapy., Disp: 30 tablet, Rfl: 1 .  Polyethyl Glycol-Propyl Glycol (SYSTANE) 0.4-0.3 % GEL  ophthalmic gel, Place 1 application into both eyes daily as needed., Disp: , Rfl:  .  PRESCRIPTION MEDICATION, Place 1 tablet vaginally 3 (three) times a week. E3 1mg  and Testosterone 1mg  compound, Disp: , Rfl:  .  prochlorperazine (COMPAZINE) 10 MG tablet, Take 1 tablet (10 mg total) by mouth every 6 (six) hours as needed (Nausea or vomiting)., Disp: 30 tablet, Rfl: 1 .  DHEA 10 MG TABS, Take 10 mg by mouth every morning. (Patient not taking: Reported on 07/15/2017), Disp: 90 tablet, Rfl: 3 .  fexofenadine (ALLEGRA) 60 MG tablet, Take 60 mg by mouth daily as needed. , Disp: , Rfl:  .  fluticasone (FLONASE SENSIMIST) 27.5 MCG/SPRAY nasal spray, Place 1 spray into the nose daily as needed. , Disp: , Rfl:  No current facility-administered medications for this visit.   Facility-Administered Medications Ordered in Other Visits:  .  CISplatin (PLATINOL) 76 mg in sodium chloride 0.9 % 250 mL chemo infusion, 40 mg/m2 (Treatment Plan Recorded), Intravenous, Once, Sindy Guadeloupe, MD .  dextrose 5 % and 0.45% NaCl 1,000 mL with potassium chloride 20 mEq, magnesium sulfate 12 mEq infusion, , Intravenous, Once, Sindy Guadeloupe, MD, Stopped at 07/15/17 1216 .  fosaprepitant (EMEND) 150 mg, dexamethasone (DECADRON) 12 mg in sodium chloride 0.9 % 145 mL IVPB, , Intravenous, Once, Sindy Guadeloupe, MD .  heparin lock flush 100 unit/mL, 500 Units, Intracatheter, Once PRN, Sindy Guadeloupe, MD .  palonosetron (ALOXI) injection 0.25 mg, 0.25 mg, Intravenous, Once, Sindy Guadeloupe, MD  Physical exam:  Vitals:   07/15/17 0918  BP: 104/72  Pulse: 78  Resp: 18  Temp: 98 F (36.7 C)  TempSrc: Tympanic  Weight: 173 lb 12.8 oz (78.8 kg)  Height: 5\' 4"  (1.626 m)   Physical Exam  Constitutional: She is oriented to person, place, and time and well-developed, well-nourished, and in no distress.  HENT:  Head: Normocephalic and atraumatic.  Right neck adenopathy appears smaller. Mild patchy redness noted over oral mucosa.  1-2 tiny oral sores seen  Eyes: EOM are normal. Pupils are equal, round, and reactive to light.  Neck: Normal range of motion.  Cardiovascular: Normal rate, regular rhythm and normal heart sounds.  Pulmonary/Chest: Effort normal and breath sounds normal.  Abdominal: Soft. Bowel sounds are normal.  Neurological: She is alert and oriented to person, place, and time.  Skin: Skin is warm and dry.     CMP Latest Ref Rng & Units 07/15/2017  Glucose 65 - 99 mg/dL 99  BUN 6 - 20 mg/dL 17  Creatinine 0.44 - 1.00 mg/dL 0.70  Sodium 135 - 145 mmol/L 137  Potassium 3.5 - 5.1 mmol/L 4.1  Chloride 101 - 111 mmol/L 105  CO2 22 - 32 mmol/L 25  Calcium 8.9 - 10.3 mg/dL 8.6(L)  Total Protein 6.5 - 8.1 g/dL 6.2(L)  Total Bilirubin 0.3 - 1.2 mg/dL 1.5(H)  Alkaline Phos 38 - 126 U/L 70  AST 15 - 41 U/L 21  ALT 14 - 54 U/L 29   CBC Latest Ref Rng & Units 07/15/2017  WBC 3.6 - 11.0 K/uL 6.9  Hemoglobin 12.0 - 16.0 g/dL 13.7  Hematocrit 35.0 - 47.0 % 40.3  Platelets 150 - 440 K/uL 144(L)    No images are attached to the encounter.  Ir Fluoro Guide Port Insertion Right  Result Date: 06/22/2017 CLINICAL DATA:  Metastatic squamous cell carcinoma to right cervical lymph node. Needs durable venous access for chemotherapy regimen. EXAM: TUNNELED PORT CATHETER  PLACEMENT WITH ULTRASOUND AND FLUOROSCOPIC GUIDANCE FLUOROSCOPY TIME:  0.1 minute, 81 uGym2 DAP ANESTHESIA/SEDATION: Intravenous Fentanyl and Versed were administered as conscious sedation during continuous monitoring of the patient's level of consciousness and physiological / cardiorespiratory status by the radiology RN, with a total moderate sedation time of 13 minutes. TECHNIQUE: The procedure, risks, benefits, and alternatives were explained to the patient. Questions regarding the procedure were encouraged and answered. The patient understands and consents to the procedure. As antibiotic prophylaxis, cefazolin/vancomycin was ordered pre-procedure and  administered intravenously within one hour of incision. Patency of the right IJ vein was confirmed with ultrasound with image documentation. An appropriate skin site was determined. Skin site was marked. Region was prepped using maximum barrier technique including cap and mask, sterile gown, sterile gloves, large sterile sheet, and Chlorhexidine as cutaneous antisepsis. The region was infiltrated locally with 1% lidocaine. Under real-time ultrasound guidance, the right IJ vein was accessed with a 21 gauge micropuncture needle; the needle tip within the vein was confirmed with ultrasound image documentation. Needle was exchanged over a 018 guidewire for transitional dilator which allowed passage of the [facility] wire into the IVC. Over this, the transitional dilator was exchanged for a 5 [place] MPA catheter. A small incision was made on the right anterior chest wall and a subcutaneous pocket fashioned. The power-injectable port was positioned and its catheter tunneled to the right IJ dermatotomy site. The MPA catheter was exchanged over an Amplatz wire for a peel-away sheath, through which the port catheter, which had been trimmed to the appropriate length, was advanced and positioned under fluoroscopy with its tip at the cavoatrial junction. Spot chest radiograph confirms good catheter position and no pneumothorax. The pocket was closed with deep interrupted and subcuticular continuous 3-0 Monocryl sutures. The port was flushed per protocol. The incisions were covered with Dermabond then covered with a sterile dressing. COMPLICATIONS: COMPLICATIONS None immediate IMPRESSION: Technically successful right IJ power-injectable port catheter placement. Ready for routine use. Electronically Signed   By: Lucrezia Europe M.D.   On: 06/22/2017 14:48     Assessment and plan- Patient is a 62 y.o. 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  She will get CBC and CMP and directly proceed for cycle #3 and I will see her back in 4 weeks with CBC and CMP for cycle #4 of weekly cisplatin.  Clinically her right neck mass appears smaller  Will prescribe Magic mouthwash for mouth ulcers and mouth soreness.  She will be seeing Herb Grays today to discuss nutritional needs.  She has lost 5 pounds of weight over the last 1 week but states that her appetite is good and she has been eating well.  Continue to monitor    Visit Diagnosis 1. Squamous cell carcinoma of oropharynx ([place])   2. Encounter for antineoplastic chemotherapy      Dr. Randa Evens, MD, MPH [facility] Pager- [phone] 07/15/2017 10:37 AM

## 2017-07-15 NOTE — Progress Notes (Signed)
Feels rough areas in her mouth, completely fatigued on wed.   And feels like she is eating a lot but lost wt.

## 2017-07-17 ENCOUNTER — Other Ambulatory Visit: Payer: Self-pay | Admitting: *Deleted

## 2017-07-17 MED ORDER — MAGIC MOUTHWASH: 5.0000 mL | Freq: Four times a day (QID) | ORAL | 1 refills | Status: DC | PRN

## 2017-07-18 ENCOUNTER — Ambulatory Visit
Admission: RE | Admit: 2017-07-18 | Discharge: 2017-07-18 | Disposition: A | Discharge status: Home / Self Care | Payer: BLUE CROSS/BLUE SHIELD | Source: Ambulatory Visit | Attending: Radiation Oncology | Admitting: Radiation Oncology

## 2017-07-18 DIAGNOSIS — Z79899 Other long term (current) drug therapy: Secondary | ICD-10-CM | POA: Diagnosis not present

## 2017-07-18 DIAGNOSIS — Z950 Presence of cardiac pacemaker: Secondary | ICD-10-CM | POA: Diagnosis not present

## 2017-07-18 DIAGNOSIS — Z510 Encounter for antineoplastic radiation therapy: Secondary | ICD-10-CM | POA: Diagnosis not present

## 2017-07-18 DIAGNOSIS — I959 Hypotension, unspecified: Secondary | ICD-10-CM | POA: Diagnosis not present

## 2017-07-18 DIAGNOSIS — I714 Abdominal aortic aneurysm, without rupture: Secondary | ICD-10-CM | POA: Diagnosis not present

## 2017-07-18 DIAGNOSIS — R4701 Aphasia: Secondary | ICD-10-CM | POA: Diagnosis not present

## 2017-07-18 DIAGNOSIS — K219 Gastro-esophageal reflux disease without esophagitis: Secondary | ICD-10-CM | POA: Diagnosis not present

## 2017-07-18 DIAGNOSIS — Z803 Family history of malignant neoplasm of breast: Secondary | ICD-10-CM | POA: Diagnosis not present

## 2017-07-18 DIAGNOSIS — F329 Major depressive disorder, single episode, unspecified: Secondary | ICD-10-CM | POA: Diagnosis not present

## 2017-07-18 DIAGNOSIS — C01 Malignant neoplasm of base of tongue: Secondary | ICD-10-CM | POA: Diagnosis not present

## 2017-07-18 DIAGNOSIS — Q231 Congenital insufficiency of aortic valve: Secondary | ICD-10-CM | POA: Diagnosis not present

## 2017-07-18 DIAGNOSIS — F419 Anxiety disorder, unspecified: Secondary | ICD-10-CM | POA: Diagnosis not present

## 2017-07-18 DIAGNOSIS — F40240 Claustrophobia: Secondary | ICD-10-CM | POA: Diagnosis not present

## 2017-07-18 DIAGNOSIS — R079 Chest pain, unspecified: Secondary | ICD-10-CM | POA: Diagnosis not present

## 2017-07-18 DIAGNOSIS — Z7982 Long term (current) use of aspirin: Secondary | ICD-10-CM | POA: Diagnosis not present

## 2017-07-19 ENCOUNTER — Ambulatory Visit
Admission: RE | Admit: 2017-07-19 | Discharge: 2017-07-19 | Disposition: A | Discharge status: Home / Self Care | Payer: BLUE CROSS/BLUE SHIELD | Source: Ambulatory Visit | Attending: Radiation Oncology | Admitting: Radiation Oncology

## 2017-07-19 DIAGNOSIS — K219 Gastro-esophageal reflux disease without esophagitis: Secondary | ICD-10-CM | POA: Diagnosis not present

## 2017-07-19 DIAGNOSIS — C01 Malignant neoplasm of base of tongue: Secondary | ICD-10-CM | POA: Diagnosis not present

## 2017-07-19 DIAGNOSIS — Q231 Congenital insufficiency of aortic valve: Secondary | ICD-10-CM | POA: Diagnosis not present

## 2017-07-19 DIAGNOSIS — Z803 Family history of malignant neoplasm of breast: Secondary | ICD-10-CM | POA: Diagnosis not present

## 2017-07-19 DIAGNOSIS — Z7982 Long term (current) use of aspirin: Secondary | ICD-10-CM | POA: Diagnosis not present

## 2017-07-19 DIAGNOSIS — F40240 Claustrophobia: Secondary | ICD-10-CM | POA: Diagnosis not present

## 2017-07-19 DIAGNOSIS — Z950 Presence of cardiac pacemaker: Secondary | ICD-10-CM | POA: Diagnosis not present

## 2017-07-19 DIAGNOSIS — Z79899 Other long term (current) drug therapy: Secondary | ICD-10-CM | POA: Diagnosis not present

## 2017-07-19 DIAGNOSIS — R4701 Aphasia: Secondary | ICD-10-CM | POA: Diagnosis not present

## 2017-07-19 DIAGNOSIS — Z510 Encounter for antineoplastic radiation therapy: Secondary | ICD-10-CM | POA: Diagnosis not present

## 2017-07-19 DIAGNOSIS — F419 Anxiety disorder, unspecified: Secondary | ICD-10-CM | POA: Diagnosis not present

## 2017-07-19 DIAGNOSIS — I959 Hypotension, unspecified: Secondary | ICD-10-CM | POA: Diagnosis not present

## 2017-07-19 DIAGNOSIS — I714 Abdominal aortic aneurysm, without rupture: Secondary | ICD-10-CM | POA: Diagnosis not present

## 2017-07-19 DIAGNOSIS — R079 Chest pain, unspecified: Secondary | ICD-10-CM | POA: Diagnosis not present

## 2017-07-19 DIAGNOSIS — F329 Major depressive disorder, single episode, unspecified: Secondary | ICD-10-CM | POA: Diagnosis not present

## 2017-07-20 ENCOUNTER — Ambulatory Visit
Admission: RE | Admit: 2017-07-20 | Discharge: 2017-07-20 | Disposition: A | Discharge status: Home / Self Care | Payer: BLUE CROSS/BLUE SHIELD | Source: Ambulatory Visit | Attending: Radiation Oncology | Admitting: Radiation Oncology

## 2017-07-20 DIAGNOSIS — Z79899 Other long term (current) drug therapy: Secondary | ICD-10-CM | POA: Diagnosis not present

## 2017-07-20 DIAGNOSIS — C01 Malignant neoplasm of base of tongue: Secondary | ICD-10-CM | POA: Diagnosis not present

## 2017-07-20 DIAGNOSIS — I959 Hypotension, unspecified: Secondary | ICD-10-CM | POA: Diagnosis not present

## 2017-07-20 DIAGNOSIS — Z803 Family history of malignant neoplasm of breast: Secondary | ICD-10-CM | POA: Diagnosis not present

## 2017-07-20 DIAGNOSIS — Z7982 Long term (current) use of aspirin: Secondary | ICD-10-CM | POA: Diagnosis not present

## 2017-07-20 DIAGNOSIS — Q231 Congenital insufficiency of aortic valve: Secondary | ICD-10-CM | POA: Diagnosis not present

## 2017-07-20 DIAGNOSIS — Z510 Encounter for antineoplastic radiation therapy: Secondary | ICD-10-CM | POA: Diagnosis not present

## 2017-07-20 DIAGNOSIS — R4701 Aphasia: Secondary | ICD-10-CM | POA: Diagnosis not present

## 2017-07-20 DIAGNOSIS — F419 Anxiety disorder, unspecified: Secondary | ICD-10-CM | POA: Diagnosis not present

## 2017-07-20 DIAGNOSIS — F40240 Claustrophobia: Secondary | ICD-10-CM | POA: Diagnosis not present

## 2017-07-20 DIAGNOSIS — F329 Major depressive disorder, single episode, unspecified: Secondary | ICD-10-CM | POA: Diagnosis not present

## 2017-07-20 DIAGNOSIS — Z950 Presence of cardiac pacemaker: Secondary | ICD-10-CM | POA: Diagnosis not present

## 2017-07-20 DIAGNOSIS — K219 Gastro-esophageal reflux disease without esophagitis: Secondary | ICD-10-CM | POA: Diagnosis not present

## 2017-07-20 DIAGNOSIS — I714 Abdominal aortic aneurysm, without rupture: Secondary | ICD-10-CM | POA: Diagnosis not present

## 2017-07-20 DIAGNOSIS — R079 Chest pain, unspecified: Secondary | ICD-10-CM | POA: Diagnosis not present

## 2017-07-21 ENCOUNTER — Ambulatory Visit
Admission: RE | Admit: 2017-07-21 | Discharge: 2017-07-21 | Disposition: A | Discharge status: Home / Self Care | Payer: BLUE CROSS/BLUE SHIELD | Source: Ambulatory Visit | Attending: Radiation Oncology | Admitting: Radiation Oncology

## 2017-07-21 DIAGNOSIS — C01 Malignant neoplasm of base of tongue: Secondary | ICD-10-CM | POA: Diagnosis not present

## 2017-07-21 DIAGNOSIS — Z79899 Other long term (current) drug therapy: Secondary | ICD-10-CM | POA: Diagnosis not present

## 2017-07-21 DIAGNOSIS — K219 Gastro-esophageal reflux disease without esophagitis: Secondary | ICD-10-CM | POA: Diagnosis not present

## 2017-07-21 DIAGNOSIS — R4701 Aphasia: Secondary | ICD-10-CM | POA: Diagnosis not present

## 2017-07-21 DIAGNOSIS — I959 Hypotension, unspecified: Secondary | ICD-10-CM | POA: Diagnosis not present

## 2017-07-21 DIAGNOSIS — F419 Anxiety disorder, unspecified: Secondary | ICD-10-CM | POA: Diagnosis not present

## 2017-07-21 DIAGNOSIS — F40240 Claustrophobia: Secondary | ICD-10-CM | POA: Diagnosis not present

## 2017-07-21 DIAGNOSIS — I714 Abdominal aortic aneurysm, without rupture: Secondary | ICD-10-CM | POA: Diagnosis not present

## 2017-07-21 DIAGNOSIS — F329 Major depressive disorder, single episode, unspecified: Secondary | ICD-10-CM | POA: Diagnosis not present

## 2017-07-21 DIAGNOSIS — Z803 Family history of malignant neoplasm of breast: Secondary | ICD-10-CM | POA: Diagnosis not present

## 2017-07-21 DIAGNOSIS — Q231 Congenital insufficiency of aortic valve: Secondary | ICD-10-CM | POA: Diagnosis not present

## 2017-07-21 DIAGNOSIS — Z7982 Long term (current) use of aspirin: Secondary | ICD-10-CM | POA: Diagnosis not present

## 2017-07-21 DIAGNOSIS — R079 Chest pain, unspecified: Secondary | ICD-10-CM | POA: Diagnosis not present

## 2017-07-21 DIAGNOSIS — Z510 Encounter for antineoplastic radiation therapy: Secondary | ICD-10-CM | POA: Diagnosis not present

## 2017-07-21 DIAGNOSIS — Z950 Presence of cardiac pacemaker: Secondary | ICD-10-CM | POA: Diagnosis not present

## 2017-07-21 NOTE — Progress Notes (Signed)
Nutrition Follow-up:  Patient with base of the tongue cancer HPV positive stage I.  Patient receiving chemotherapy and radiation therapy.    Met with patient following radiation today briefly.  Patient with mouth sores but swallowing ok.  Reports that she received free samples from [facility] that RD requested for her and has tried the peptide chocolate shake (500 calorie, 24 g of protein) and able to drink it. Reports that she has really been trying hard over the weekend to eat well.  Thinks she may have gained some of her weight back that she lost last week.    Medications: magic mouthwash  Labs: reviewed  Anthropometrics:   Noted weight on 1/4 decreased to 173 lb from 177 lb on initial assessment   Estimated Energy Needs  Kcals: 2000-2400 calories/d Protein: 100-120 g/d Fluid: 2.4 L/d  NUTRITION DIAGNOSIS: Predicted suboptimal energy intake continues with treatment progression   MALNUTRITION DIAGNOSIS: continue to monitor   INTERVENTION:   Encouraged patient on days when she is not eating well to drink at least 2 of high calorie shakes and on days when eating better drink at least 2 lower calorie shakes to better meet nutritional needs. RD working with [employer] representative and DME groups to provide product to patient at lowest cost.  If patient needs feeding tube [employer] product would be able to provide sole-source nutrition for patient and meet needs for food allergies.   RD worked with Paramedic to provide lunch and snack options to patient during infusion due to food allergies.  Patient reviewed menu options and prefers to bring own lunch and snacks at this time.      MONITORING, EVALUATION, GOAL: weight trends, intake   NEXT VISIT: Jan 17 after radiation therapy  Evan Osburn B. [name], [place], [place] Registered Dietitian [phone] (pager)

## 2017-07-22 ENCOUNTER — Inpatient Hospital Stay: Payer: BLUE CROSS/BLUE SHIELD

## 2017-07-22 ENCOUNTER — Ambulatory Visit
Admission: RE | Admit: 2017-07-22 | Discharge: 2017-07-22 | Disposition: A | Discharge status: Home / Self Care | Payer: BLUE CROSS/BLUE SHIELD | Source: Ambulatory Visit | Attending: Radiation Oncology | Admitting: Radiation Oncology

## 2017-07-22 ENCOUNTER — Other Ambulatory Visit: Payer: Self-pay | Admitting: *Deleted

## 2017-07-22 VITALS — BP 138/81 | HR 81 | Temp 98.1°F | Resp 18 | Wt 175.1 lb

## 2017-07-22 DIAGNOSIS — I959 Hypotension, unspecified: Secondary | ICD-10-CM | POA: Diagnosis not present

## 2017-07-22 DIAGNOSIS — Z5111 Encounter for antineoplastic chemotherapy: Secondary | ICD-10-CM | POA: Diagnosis not present

## 2017-07-22 DIAGNOSIS — R531 Weakness: Secondary | ICD-10-CM | POA: Diagnosis not present

## 2017-07-22 DIAGNOSIS — R079 Chest pain, unspecified: Secondary | ICD-10-CM | POA: Diagnosis not present

## 2017-07-22 DIAGNOSIS — R4701 Aphasia: Secondary | ICD-10-CM | POA: Diagnosis not present

## 2017-07-22 DIAGNOSIS — G4700 Insomnia, unspecified: Secondary | ICD-10-CM | POA: Diagnosis not present

## 2017-07-22 DIAGNOSIS — C01 Malignant neoplasm of base of tongue: Secondary | ICD-10-CM | POA: Diagnosis not present

## 2017-07-22 DIAGNOSIS — Z7982 Long term (current) use of aspirin: Secondary | ICD-10-CM | POA: Diagnosis not present

## 2017-07-22 DIAGNOSIS — B370 Candidal stomatitis: Secondary | ICD-10-CM | POA: Diagnosis not present

## 2017-07-22 DIAGNOSIS — C770 Secondary and unspecified malignant neoplasm of lymph nodes of head, face and neck: Secondary | ICD-10-CM | POA: Diagnosis not present

## 2017-07-22 DIAGNOSIS — C109 Malignant neoplasm of oropharynx, unspecified: Secondary | ICD-10-CM

## 2017-07-22 DIAGNOSIS — J019 Acute sinusitis, unspecified: Secondary | ICD-10-CM | POA: Diagnosis not present

## 2017-07-22 DIAGNOSIS — Z79899 Other long term (current) drug therapy: Secondary | ICD-10-CM | POA: Diagnosis not present

## 2017-07-22 DIAGNOSIS — Z510 Encounter for antineoplastic radiation therapy: Secondary | ICD-10-CM | POA: Diagnosis not present

## 2017-07-22 DIAGNOSIS — F329 Major depressive disorder, single episode, unspecified: Secondary | ICD-10-CM | POA: Diagnosis not present

## 2017-07-22 DIAGNOSIS — Q231 Congenital insufficiency of aortic valve: Secondary | ICD-10-CM | POA: Diagnosis not present

## 2017-07-22 DIAGNOSIS — R5383 Other fatigue: Secondary | ICD-10-CM | POA: Diagnosis not present

## 2017-07-22 DIAGNOSIS — D696 Thrombocytopenia, unspecified: Secondary | ICD-10-CM | POA: Diagnosis not present

## 2017-07-22 DIAGNOSIS — I714 Abdominal aortic aneurysm, without rupture: Secondary | ICD-10-CM | POA: Diagnosis not present

## 2017-07-22 DIAGNOSIS — R040 Epistaxis: Secondary | ICD-10-CM | POA: Diagnosis not present

## 2017-07-22 DIAGNOSIS — Z950 Presence of cardiac pacemaker: Secondary | ICD-10-CM | POA: Diagnosis not present

## 2017-07-22 DIAGNOSIS — R634 Abnormal weight loss: Secondary | ICD-10-CM | POA: Diagnosis not present

## 2017-07-22 DIAGNOSIS — F419 Anxiety disorder, unspecified: Secondary | ICD-10-CM | POA: Diagnosis not present

## 2017-07-22 DIAGNOSIS — Z803 Family history of malignant neoplasm of breast: Secondary | ICD-10-CM | POA: Diagnosis not present

## 2017-07-22 DIAGNOSIS — E860 Dehydration: Secondary | ICD-10-CM | POA: Diagnosis not present

## 2017-07-22 DIAGNOSIS — K219 Gastro-esophageal reflux disease without esophagitis: Secondary | ICD-10-CM | POA: Diagnosis not present

## 2017-07-22 DIAGNOSIS — F40240 Claustrophobia: Secondary | ICD-10-CM | POA: Diagnosis not present

## 2017-07-22 LAB — COMPREHENSIVE METABOLIC PANEL
ALT: 21 U/L (ref 14–54)
ANION GAP: 7 (ref 5–15)
AST: 17 U/L (ref 15–41)
Albumin: 3.4 g/dL — ABNORMAL LOW (ref 3.5–5.0)
Alkaline Phosphatase: 67 U/L (ref 38–126)
BUN: 18 mg/dL (ref 6–20)
CHLORIDE: 105 mmol/L (ref 101–111)
CO2: 26 mmol/L (ref 22–32)
Calcium: 8.5 mg/dL — ABNORMAL LOW (ref 8.9–10.3)
Creatinine, Ser: 0.66 mg/dL (ref 0.44–1.00)
GFR calc Af Amer: >60 mL/min (ref >60)
Glucose, Bld: 99 mg/dL (ref 65–99)
POTASSIUM: 4 mmol/L (ref 3.5–5.1)
Sodium: 138 mmol/L (ref 135–145)
Total Bilirubin: 1 mg/dL (ref 0.3–1.2)
Total Protein: 5.9 g/dL — ABNORMAL LOW (ref 6.5–8.1)

## 2017-07-22 LAB — CBC WITH DIFFERENTIAL/PLATELET
Basophils Absolute: 0 10*3/uL (ref 0–0.1)
Basophils Relative: 0 %
Eosinophils Absolute: 0 10*3/uL (ref 0–0.7)
Eosinophils Relative: 0 %
HEMATOCRIT: 37.2 % (ref 35.0–47.0)
HEMOGLOBIN: 12.6 g/dL (ref 12.0–16.0)
LYMPHS ABS: 0.7 10*3/uL — AB (ref 1.0–3.6)
LYMPHS PCT: 8 %
MCH: 31 pg (ref 26.0–34.0)
MCHC: 34 g/dL (ref 32.0–36.0)
MCV: 91.2 fL (ref 80.0–100.0)
Monocytes Absolute: 0.6 10*3/uL (ref 0.2–0.9)
Monocytes Relative: 8 %
NEUTROS ABS: 6.9 10*3/uL — AB (ref 1.4–6.5)
NEUTROS PCT: 84 %
Platelets: 127 10*3/uL — ABNORMAL LOW (ref 150–440)
RBC: 4.08 MIL/uL (ref 3.80–5.20)
RDW: 12.8 % (ref 11.5–14.5)
WBC: 8.3 10*3/uL (ref 3.6–11.0)

## 2017-07-22 MED ORDER — FOSAPREPITANT DIMEGLUMINE INJECTION 150 MG
Freq: Once | INTRAVENOUS | Status: AC
  Administered 2017-07-22: 12:00:00 via INTRAVENOUS
  Filled 2017-07-22: qty 5

## 2017-07-22 MED ORDER — PALONOSETRON HCL INJECTION 0.25 MG/5ML
0.2500 mg | Freq: Once | INTRAVENOUS | Status: AC
  Administered 2017-07-22: 0.25 mg via INTRAVENOUS
  Filled 2017-07-22: qty 5

## 2017-07-22 MED ORDER — SODIUM CHLORIDE 0.9% FLUSH
10.0000 mL | INTRAVENOUS | Status: DC | PRN
  Filled 2017-07-22: qty 10

## 2017-07-22 MED ORDER — SODIUM CHLORIDE 0.9% FLUSH
10.0000 mL | INTRAVENOUS | Status: DC | PRN
  Administered 2017-07-22: 10 mL via INTRAVENOUS
  Filled 2017-07-22: qty 10

## 2017-07-22 MED ORDER — POTASSIUM CHLORIDE 2 MEQ/ML IV SOLN
Freq: Once | INTRAVENOUS | Status: AC
  Administered 2017-07-22: 10:00:00 via INTRAVENOUS
  Filled 2017-07-22: qty 1000

## 2017-07-22 MED ORDER — HEPARIN SOD (PORK) LOCK FLUSH 100 UNIT/ML IV SOLN
500.0000 [IU] | Freq: Once | INTRAVENOUS | Status: DC | PRN
  Filled 2017-07-22: qty 5

## 2017-07-22 MED ORDER — SODIUM CHLORIDE 0.9 % IV SOLN
40.0000 mg/m2 | Freq: Once | INTRAVENOUS | Status: AC
  Administered 2017-07-22: 76 mg via INTRAVENOUS
  Filled 2017-07-22: qty 76

## 2017-07-22 MED ORDER — HEPARIN SOD (PORK) LOCK FLUSH 100 UNIT/ML IV SOLN
500.0000 [IU] | Freq: Once | INTRAVENOUS | Status: AC
  Administered 2017-07-22: 500 [IU] via INTRAVENOUS

## 2017-07-22 MED ORDER — SODIUM CHLORIDE 0.9 % IV SOLN
Freq: Once | INTRAVENOUS | Status: AC
  Administered 2017-07-22: 09:00:00 via INTRAVENOUS
  Filled 2017-07-22: qty 1000

## 2017-07-25 ENCOUNTER — Ambulatory Visit
Admission: RE | Admit: 2017-07-25 | Discharge: 2017-07-25 | Disposition: A | Discharge status: Home / Self Care | Payer: BLUE CROSS/BLUE SHIELD | Source: Ambulatory Visit | Attending: Radiation Oncology | Admitting: Radiation Oncology

## 2017-07-25 DIAGNOSIS — F40240 Claustrophobia: Secondary | ICD-10-CM | POA: Diagnosis not present

## 2017-07-25 DIAGNOSIS — Z803 Family history of malignant neoplasm of breast: Secondary | ICD-10-CM | POA: Diagnosis not present

## 2017-07-25 DIAGNOSIS — R079 Chest pain, unspecified: Secondary | ICD-10-CM | POA: Diagnosis not present

## 2017-07-25 DIAGNOSIS — K219 Gastro-esophageal reflux disease without esophagitis: Secondary | ICD-10-CM | POA: Diagnosis not present

## 2017-07-25 DIAGNOSIS — F329 Major depressive disorder, single episode, unspecified: Secondary | ICD-10-CM | POA: Diagnosis not present

## 2017-07-25 DIAGNOSIS — I959 Hypotension, unspecified: Secondary | ICD-10-CM | POA: Diagnosis not present

## 2017-07-25 DIAGNOSIS — Z950 Presence of cardiac pacemaker: Secondary | ICD-10-CM | POA: Diagnosis not present

## 2017-07-25 DIAGNOSIS — C01 Malignant neoplasm of base of tongue: Secondary | ICD-10-CM | POA: Diagnosis not present

## 2017-07-25 DIAGNOSIS — Z7982 Long term (current) use of aspirin: Secondary | ICD-10-CM | POA: Diagnosis not present

## 2017-07-25 DIAGNOSIS — Q231 Congenital insufficiency of aortic valve: Secondary | ICD-10-CM | POA: Diagnosis not present

## 2017-07-25 DIAGNOSIS — F419 Anxiety disorder, unspecified: Secondary | ICD-10-CM | POA: Diagnosis not present

## 2017-07-25 DIAGNOSIS — I714 Abdominal aortic aneurysm, without rupture: Secondary | ICD-10-CM | POA: Diagnosis not present

## 2017-07-25 DIAGNOSIS — Z79899 Other long term (current) drug therapy: Secondary | ICD-10-CM | POA: Diagnosis not present

## 2017-07-25 DIAGNOSIS — Z510 Encounter for antineoplastic radiation therapy: Secondary | ICD-10-CM | POA: Diagnosis not present

## 2017-07-25 DIAGNOSIS — R4701 Aphasia: Secondary | ICD-10-CM | POA: Diagnosis not present

## 2017-07-26 ENCOUNTER — Telehealth: Payer: Self-pay | Admitting: *Deleted

## 2017-07-26 ENCOUNTER — Other Ambulatory Visit: Payer: Self-pay | Admitting: *Deleted

## 2017-07-26 ENCOUNTER — Ambulatory Visit
Admission: RE | Admit: 2017-07-26 | Discharge: 2017-07-26 | Disposition: A | Discharge status: Home / Self Care | Payer: BLUE CROSS/BLUE SHIELD | Source: Ambulatory Visit | Attending: Radiation Oncology | Admitting: Radiation Oncology

## 2017-07-26 DIAGNOSIS — I714 Abdominal aortic aneurysm, without rupture: Secondary | ICD-10-CM | POA: Diagnosis not present

## 2017-07-26 DIAGNOSIS — Z510 Encounter for antineoplastic radiation therapy: Secondary | ICD-10-CM | POA: Diagnosis not present

## 2017-07-26 DIAGNOSIS — C760 Malignant neoplasm of head, face and neck: Secondary | ICD-10-CM

## 2017-07-26 DIAGNOSIS — K219 Gastro-esophageal reflux disease without esophagitis: Secondary | ICD-10-CM | POA: Diagnosis not present

## 2017-07-26 DIAGNOSIS — Z803 Family history of malignant neoplasm of breast: Secondary | ICD-10-CM | POA: Diagnosis not present

## 2017-07-26 DIAGNOSIS — R079 Chest pain, unspecified: Secondary | ICD-10-CM | POA: Diagnosis not present

## 2017-07-26 DIAGNOSIS — Z950 Presence of cardiac pacemaker: Secondary | ICD-10-CM | POA: Diagnosis not present

## 2017-07-26 DIAGNOSIS — C01 Malignant neoplasm of base of tongue: Secondary | ICD-10-CM | POA: Diagnosis not present

## 2017-07-26 DIAGNOSIS — Z79899 Other long term (current) drug therapy: Secondary | ICD-10-CM | POA: Diagnosis not present

## 2017-07-26 DIAGNOSIS — R4701 Aphasia: Secondary | ICD-10-CM | POA: Diagnosis not present

## 2017-07-26 DIAGNOSIS — F419 Anxiety disorder, unspecified: Secondary | ICD-10-CM | POA: Diagnosis not present

## 2017-07-26 DIAGNOSIS — F40240 Claustrophobia: Secondary | ICD-10-CM | POA: Diagnosis not present

## 2017-07-26 DIAGNOSIS — Z7982 Long term (current) use of aspirin: Secondary | ICD-10-CM | POA: Diagnosis not present

## 2017-07-26 DIAGNOSIS — I959 Hypotension, unspecified: Secondary | ICD-10-CM | POA: Diagnosis not present

## 2017-07-26 DIAGNOSIS — F329 Major depressive disorder, single episode, unspecified: Secondary | ICD-10-CM | POA: Diagnosis not present

## 2017-07-26 DIAGNOSIS — Q231 Congenital insufficiency of aortic valve: Secondary | ICD-10-CM | POA: Diagnosis not present

## 2017-07-26 MED ORDER — FLUCONAZOLE 100 MG PO TABS: 100.0000 mg | ORAL_TABLET | Freq: Every day | ORAL | 0 refills | Status: DC

## 2017-07-26 MED ORDER — DEXAMETHASONE 4 MG PO TABS: ORAL_TABLET | ORAL | 1 refills | Status: DC

## 2017-07-26 MED ORDER — SUCRALFATE 1 G PO TABS: 1.0000 g | ORAL_TABLET | Freq: Three times a day (TID) | ORAL | 3 refills | Status: DC

## 2017-07-26 MED ORDER — PROCHLORPERAZINE MALEATE 10 MG PO TABS: 10.0000 mg | ORAL_TABLET | Freq: Four times a day (QID) | ORAL | 1 refills | Status: DC | PRN

## 2017-07-26 NOTE — Telephone Encounter (Signed)
Prescription faxed patient informed

## 2017-07-26 NOTE — Telephone Encounter (Signed)
We can do that. Can you please send the order? Thanks, Astrid Divine

## 2017-07-26 NOTE — Telephone Encounter (Signed)
Nystatin suspension is on back order and she needs her Mouthwash. [facility] told her that if we send an order to them to use Nystatin tablets, they can crush them and make her mouthwash. Please advise

## 2017-07-27 ENCOUNTER — Ambulatory Visit
Admission: RE | Admit: 2017-07-27 | Discharge: 2017-07-27 | Disposition: A | Discharge status: Home / Self Care | Payer: BLUE CROSS/BLUE SHIELD | Source: Ambulatory Visit | Attending: Radiation Oncology | Admitting: Radiation Oncology

## 2017-07-27 DIAGNOSIS — Z803 Family history of malignant neoplasm of breast: Secondary | ICD-10-CM | POA: Diagnosis not present

## 2017-07-27 DIAGNOSIS — Z79899 Other long term (current) drug therapy: Secondary | ICD-10-CM | POA: Diagnosis not present

## 2017-07-27 DIAGNOSIS — R4701 Aphasia: Secondary | ICD-10-CM | POA: Diagnosis not present

## 2017-07-27 DIAGNOSIS — F419 Anxiety disorder, unspecified: Secondary | ICD-10-CM | POA: Diagnosis not present

## 2017-07-27 DIAGNOSIS — Z950 Presence of cardiac pacemaker: Secondary | ICD-10-CM | POA: Diagnosis not present

## 2017-07-27 DIAGNOSIS — C01 Malignant neoplasm of base of tongue: Secondary | ICD-10-CM | POA: Diagnosis not present

## 2017-07-27 DIAGNOSIS — R079 Chest pain, unspecified: Secondary | ICD-10-CM | POA: Diagnosis not present

## 2017-07-27 DIAGNOSIS — K219 Gastro-esophageal reflux disease without esophagitis: Secondary | ICD-10-CM | POA: Diagnosis not present

## 2017-07-27 DIAGNOSIS — Z510 Encounter for antineoplastic radiation therapy: Secondary | ICD-10-CM | POA: Diagnosis not present

## 2017-07-27 DIAGNOSIS — Q231 Congenital insufficiency of aortic valve: Secondary | ICD-10-CM | POA: Diagnosis not present

## 2017-07-27 DIAGNOSIS — F329 Major depressive disorder, single episode, unspecified: Secondary | ICD-10-CM | POA: Diagnosis not present

## 2017-07-27 DIAGNOSIS — I714 Abdominal aortic aneurysm, without rupture: Secondary | ICD-10-CM | POA: Diagnosis not present

## 2017-07-27 DIAGNOSIS — I959 Hypotension, unspecified: Secondary | ICD-10-CM | POA: Diagnosis not present

## 2017-07-27 DIAGNOSIS — F40240 Claustrophobia: Secondary | ICD-10-CM | POA: Diagnosis not present

## 2017-07-27 DIAGNOSIS — Z7982 Long term (current) use of aspirin: Secondary | ICD-10-CM | POA: Diagnosis not present

## 2017-07-28 ENCOUNTER — Telehealth: Payer: Self-pay | Admitting: *Deleted

## 2017-07-28 ENCOUNTER — Ambulatory Visit
Admission: RE | Admit: 2017-07-28 | Discharge: 2017-07-28 | Disposition: A | Discharge status: Home / Self Care | Payer: BLUE CROSS/BLUE SHIELD | Source: Ambulatory Visit | Attending: Radiation Oncology | Admitting: Radiation Oncology

## 2017-07-28 ENCOUNTER — Other Ambulatory Visit: Payer: Self-pay | Admitting: *Deleted

## 2017-07-28 ENCOUNTER — Inpatient Hospital Stay: Payer: BLUE CROSS/BLUE SHIELD

## 2017-07-28 DIAGNOSIS — F40240 Claustrophobia: Secondary | ICD-10-CM | POA: Diagnosis not present

## 2017-07-28 DIAGNOSIS — I714 Abdominal aortic aneurysm, without rupture: Secondary | ICD-10-CM | POA: Diagnosis not present

## 2017-07-28 DIAGNOSIS — F419 Anxiety disorder, unspecified: Secondary | ICD-10-CM | POA: Diagnosis not present

## 2017-07-28 DIAGNOSIS — Z510 Encounter for antineoplastic radiation therapy: Secondary | ICD-10-CM | POA: Diagnosis not present

## 2017-07-28 DIAGNOSIS — Z7982 Long term (current) use of aspirin: Secondary | ICD-10-CM | POA: Diagnosis not present

## 2017-07-28 DIAGNOSIS — Z79899 Other long term (current) drug therapy: Secondary | ICD-10-CM | POA: Diagnosis not present

## 2017-07-28 DIAGNOSIS — Z803 Family history of malignant neoplasm of breast: Secondary | ICD-10-CM | POA: Diagnosis not present

## 2017-07-28 DIAGNOSIS — C01 Malignant neoplasm of base of tongue: Secondary | ICD-10-CM | POA: Diagnosis not present

## 2017-07-28 DIAGNOSIS — I959 Hypotension, unspecified: Secondary | ICD-10-CM | POA: Diagnosis not present

## 2017-07-28 DIAGNOSIS — F329 Major depressive disorder, single episode, unspecified: Secondary | ICD-10-CM | POA: Diagnosis not present

## 2017-07-28 DIAGNOSIS — Q231 Congenital insufficiency of aortic valve: Secondary | ICD-10-CM | POA: Diagnosis not present

## 2017-07-28 DIAGNOSIS — K219 Gastro-esophageal reflux disease without esophagitis: Secondary | ICD-10-CM | POA: Diagnosis not present

## 2017-07-28 DIAGNOSIS — R4701 Aphasia: Secondary | ICD-10-CM | POA: Diagnosis not present

## 2017-07-28 DIAGNOSIS — Z950 Presence of cardiac pacemaker: Secondary | ICD-10-CM | POA: Diagnosis not present

## 2017-07-28 DIAGNOSIS — R079 Chest pain, unspecified: Secondary | ICD-10-CM | POA: Diagnosis not present

## 2017-07-28 DIAGNOSIS — C109 Malignant neoplasm of oropharynx, unspecified: Secondary | ICD-10-CM

## 2017-07-28 NOTE — Progress Notes (Signed)
Nutrition Follow-up:  Patient with base of the tongue cancer HPV positive stage I.  Patient receiving chemotherapy and radiation therapy.    Met with patient following radiation therapy today.  Patient tearful today.  Reports that she had the worst day yesterday since starting treatment.  Reports she is being treated for thrush, having nose bleeds, felt nauseated.  Reports nausea during mouth rinses.  Limited intake yesterday. Reports that water taste bad.  Has started drinking [employer] 325 calorie shake.  Does not like the 500 calorie shake but able to mix fruit and syrup in shake to flavor it up.  Reports liquids burn mouth.    Medications: reviewed  Labs: reviewed  Anthropometrics:  Weight on 1/11 175 lb 2 oz, increased from 173 lb Initial assessment weight 177 lb   NUTRITION DIAGNOSIS: Predicted suboptimal energy intake continues as treatment progresses   MALNUTRITION DIAGNOSIS: continue to monitor   INTERVENTION:   Offered encouragement for patient to continue to taking high calorie, high protein foods.   Encouraged patient to take nausea medications on regular basis to help relieve symptoms as well as continue mouth rinses Discussed options with patient regarding obtaining [employer] product (meets patient allergy criteria and can be used as sole source nutrition if needed).  Patient would like to pursue obtaining [employer] product (standard 1.0 chocolate) from [employer].  Will reach out to representative for patient.   Patient could benefit from SLP evaluation and follow-up due to effects of radiation therapy.     MONITORING, EVALUATION, GOAL: weight trends, intake   NEXT VISIT: Jan 31 after infusion  Matsue Strom B. [name], [place], [place] Registered Dietitian [phone] (pager)

## 2017-07-28 NOTE — Telephone Encounter (Signed)
Pt came in for appt with dietician.  Per pt. She lightly blows her nose and she gets blood with mixture of mucus. She uses nasal saline to spray in her nose and then uses humidifier at night in her room.  Sometimes she has clots of blood but no nose bleeding without her blowing her nose.  Dr. Janese Banks says that if she has straight blood then should call us. She can talk to here about afrin but not at this point. She did ask for refills of lorazepam, and compazine and it has been called in

## 2017-07-29 ENCOUNTER — Inpatient Hospital Stay: Payer: BLUE CROSS/BLUE SHIELD

## 2017-07-29 ENCOUNTER — Ambulatory Visit
Admission: RE | Admit: 2017-07-29 | Discharge: 2017-07-29 | Disposition: A | Discharge status: Home / Self Care | Payer: BLUE CROSS/BLUE SHIELD | Source: Ambulatory Visit | Attending: Radiation Oncology | Admitting: Radiation Oncology

## 2017-07-29 ENCOUNTER — Encounter: Payer: Self-pay | Admitting: Oncology

## 2017-07-29 ENCOUNTER — Inpatient Hospital Stay (HOSPITAL_BASED_OUTPATIENT_CLINIC_OR_DEPARTMENT_OTHER): Payer: BLUE CROSS/BLUE SHIELD | Admitting: Oncology

## 2017-07-29 VITALS — BP 105/73 | HR 85 | Temp 98.3°F | Resp 18 | Wt 172.0 lb

## 2017-07-29 DIAGNOSIS — I714 Abdominal aortic aneurysm, without rupture: Secondary | ICD-10-CM | POA: Diagnosis not present

## 2017-07-29 DIAGNOSIS — C109 Malignant neoplasm of oropharynx, unspecified: Secondary | ICD-10-CM

## 2017-07-29 DIAGNOSIS — K219 Gastro-esophageal reflux disease without esophagitis: Secondary | ICD-10-CM | POA: Diagnosis not present

## 2017-07-29 DIAGNOSIS — F40240 Claustrophobia: Secondary | ICD-10-CM | POA: Diagnosis not present

## 2017-07-29 DIAGNOSIS — Z5111 Encounter for antineoplastic chemotherapy: Secondary | ICD-10-CM | POA: Diagnosis not present

## 2017-07-29 DIAGNOSIS — R634 Abnormal weight loss: Secondary | ICD-10-CM

## 2017-07-29 DIAGNOSIS — F329 Major depressive disorder, single episode, unspecified: Secondary | ICD-10-CM | POA: Diagnosis not present

## 2017-07-29 DIAGNOSIS — D696 Thrombocytopenia, unspecified: Secondary | ICD-10-CM | POA: Diagnosis not present

## 2017-07-29 DIAGNOSIS — C770 Secondary and unspecified malignant neoplasm of lymph nodes of head, face and neck: Secondary | ICD-10-CM | POA: Diagnosis not present

## 2017-07-29 DIAGNOSIS — Z950 Presence of cardiac pacemaker: Secondary | ICD-10-CM

## 2017-07-29 DIAGNOSIS — C01 Malignant neoplasm of base of tongue: Secondary | ICD-10-CM | POA: Diagnosis not present

## 2017-07-29 DIAGNOSIS — Z79899 Other long term (current) drug therapy: Secondary | ICD-10-CM

## 2017-07-29 DIAGNOSIS — Z7982 Long term (current) use of aspirin: Secondary | ICD-10-CM | POA: Diagnosis not present

## 2017-07-29 DIAGNOSIS — G4700 Insomnia, unspecified: Secondary | ICD-10-CM | POA: Diagnosis not present

## 2017-07-29 DIAGNOSIS — Q231 Congenital insufficiency of aortic valve: Secondary | ICD-10-CM | POA: Diagnosis not present

## 2017-07-29 DIAGNOSIS — Z95828 Presence of other vascular implants and grafts: Secondary | ICD-10-CM

## 2017-07-29 DIAGNOSIS — F419 Anxiety disorder, unspecified: Secondary | ICD-10-CM | POA: Diagnosis not present

## 2017-07-29 DIAGNOSIS — R5383 Other fatigue: Secondary | ICD-10-CM | POA: Diagnosis not present

## 2017-07-29 DIAGNOSIS — R531 Weakness: Secondary | ICD-10-CM | POA: Diagnosis not present

## 2017-07-29 DIAGNOSIS — I959 Hypotension, unspecified: Secondary | ICD-10-CM | POA: Diagnosis not present

## 2017-07-29 DIAGNOSIS — R4701 Aphasia: Secondary | ICD-10-CM

## 2017-07-29 DIAGNOSIS — R040 Epistaxis: Secondary | ICD-10-CM | POA: Diagnosis not present

## 2017-07-29 DIAGNOSIS — J019 Acute sinusitis, unspecified: Secondary | ICD-10-CM | POA: Diagnosis not present

## 2017-07-29 DIAGNOSIS — R079 Chest pain, unspecified: Secondary | ICD-10-CM | POA: Diagnosis not present

## 2017-07-29 DIAGNOSIS — D6959 Other secondary thrombocytopenia: Secondary | ICD-10-CM

## 2017-07-29 DIAGNOSIS — R200 Anesthesia of skin: Secondary | ICD-10-CM

## 2017-07-29 DIAGNOSIS — T451X5A Adverse effect of antineoplastic and immunosuppressive drugs, initial encounter: Secondary | ICD-10-CM

## 2017-07-29 DIAGNOSIS — B370 Candidal stomatitis: Secondary | ICD-10-CM | POA: Diagnosis not present

## 2017-07-29 DIAGNOSIS — E860 Dehydration: Secondary | ICD-10-CM | POA: Diagnosis not present

## 2017-07-29 DIAGNOSIS — Z510 Encounter for antineoplastic radiation therapy: Secondary | ICD-10-CM | POA: Diagnosis not present

## 2017-07-29 DIAGNOSIS — Z803 Family history of malignant neoplasm of breast: Secondary | ICD-10-CM | POA: Diagnosis not present

## 2017-07-29 LAB — COMPREHENSIVE METABOLIC PANEL
ALBUMIN: 3.3 g/dL — AB (ref 3.5–5.0)
ALK PHOS: 67 U/L (ref 38–126)
ALT: 23 U/L (ref 14–54)
AST: 18 U/L (ref 15–41)
Anion gap: 8 (ref 5–15)
BILIRUBIN TOTAL: 1.2 mg/dL (ref 0.3–1.2)
BUN: 14 mg/dL (ref 6–20)
CO2: 26 mmol/L (ref 22–32)
CREATININE: 0.68 mg/dL (ref 0.44–1.00)
Calcium: 8.5 mg/dL — ABNORMAL LOW (ref 8.9–10.3)
Chloride: 103 mmol/L (ref 101–111)
GFR calc Af Amer: >60 mL/min (ref >60)
GFR calc non Af Amer: >60 mL/min (ref >60)
GLUCOSE: 98 mg/dL (ref 65–99)
Potassium: 3.9 mmol/L (ref 3.5–5.1)
Sodium: 137 mmol/L (ref 135–145)
TOTAL PROTEIN: 5.9 g/dL — AB (ref 6.5–8.1)

## 2017-07-29 LAB — CBC WITH DIFFERENTIAL/PLATELET
BASOS ABS: 0 10*3/uL (ref 0–0.1)
BASOS PCT: 0 %
EOS PCT: 0 %
Eosinophils Absolute: 0 10*3/uL (ref 0–0.7)
HCT: 35.8 % (ref 35.0–47.0)
Hemoglobin: 12.2 g/dL (ref 12.0–16.0)
Lymphocytes Relative: 8 %
Lymphs Abs: 0.4 10*3/uL — ABNORMAL LOW (ref 1.0–3.6)
MCH: 31.1 pg (ref 26.0–34.0)
MCHC: 34 g/dL (ref 32.0–36.0)
MCV: 91.4 fL (ref 80.0–100.0)
MONO ABS: 0.4 10*3/uL (ref 0.2–0.9)
Monocytes Relative: 7 %
Neutro Abs: 5 10*3/uL (ref 1.4–6.5)
Neutrophils Relative %: 85 %
PLATELETS: 119 10*3/uL — AB (ref 150–440)
RBC: 3.92 MIL/uL (ref 3.80–5.20)
RDW: 12.7 % (ref 11.5–14.5)
WBC: 5.9 10*3/uL (ref 3.6–11.0)

## 2017-07-29 MED ORDER — HEPARIN SOD (PORK) LOCK FLUSH 100 UNIT/ML IV SOLN
500.0000 [IU] | Freq: Once | INTRAVENOUS | Status: AC | PRN
  Administered 2017-07-29: 500 [IU]
  Filled 2017-07-29: qty 5

## 2017-07-29 MED ORDER — HEPARIN SOD (PORK) LOCK FLUSH 100 UNIT/ML IV SOLN
500.0000 [IU] | Freq: Once | INTRAVENOUS | Status: AC
  Administered 2017-07-29: 500 [IU] via INTRAVENOUS

## 2017-07-29 MED ORDER — SODIUM CHLORIDE 0.9 % IV SOLN
Freq: Once | INTRAVENOUS | Status: AC
  Administered 2017-07-29: 12:00:00 via INTRAVENOUS
  Filled 2017-07-29: qty 5

## 2017-07-29 MED ORDER — POTASSIUM CHLORIDE 2 MEQ/ML IV SOLN
Freq: Once | INTRAVENOUS | Status: AC
  Administered 2017-07-29: 10:00:00 via INTRAVENOUS
  Filled 2017-07-29: qty 1000

## 2017-07-29 MED ORDER — SODIUM CHLORIDE 0.9% FLUSH
10.0000 mL | INTRAVENOUS | Status: DC | PRN
  Administered 2017-07-29: 10 mL via INTRAVENOUS
  Filled 2017-07-29: qty 10

## 2017-07-29 MED ORDER — SODIUM CHLORIDE 0.9 % IV SOLN
Freq: Once | INTRAVENOUS | Status: AC
  Administered 2017-07-29: 10:00:00 via INTRAVENOUS
  Filled 2017-07-29: qty 1000

## 2017-07-29 MED ORDER — SODIUM CHLORIDE 0.9 % IV SOLN
40.0000 mg/m2 | Freq: Once | INTRAVENOUS | Status: AC
  Administered 2017-07-29: 76 mg via INTRAVENOUS
  Filled 2017-07-29: qty 76

## 2017-07-29 MED ORDER — PALONOSETRON HCL INJECTION 0.25 MG/5ML
0.2500 mg | Freq: Once | INTRAVENOUS | Status: AC
  Administered 2017-07-29: 0.25 mg via INTRAVENOUS
  Filled 2017-07-29: qty 5

## 2017-07-29 NOTE — Progress Notes (Signed)
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 Consuela Mimes evaluation of right neck mass. The mass has been present for or a year and has been growing gradually. It was associated with some right ear pain but no fever. Patient has been a never smoker.Ultrasound on 05/30/2017 showed:Single enlarged lymph node in the right neck measures approximately 4.8 x 1.7 x 3.0 cm  2.Patient underwent IR guided lymph node biopsy of the cervical lymph node which showed metastatic squamous cell carcinoma. Staining for P 16 was positive and sox 10 was negative   3.PET/CT scan on 06/09/2017 showed intensely hypermetabolic enlarged right level 2 cervical lymph node with an SUV of 11.1. No clear site of primary malignancy within the head and neck. Potential site of investigation would include right base of tongue. There was asymmetric metabolic activity noted in that area with an SUV of 4.5. Less suspicious region in the posterior glottis. No evidence of distant metastatic disease.  4.Patient works as a Geophysicist/field seismologist at [employer]. Patient has had multiple allergies to medications in the past. Reports that when she took steroids in the past it made her quite aggravated. Her sister was hospitalized in a mental health facility after receiving a steroid course in the past.  5. Right tongue base biopsy positive for SCC. Plan is  to proceed with concurrent chemo/RT with cisplatin/RT. Baseline audiogram normal    Interval history- she is trying to eat and keep up with hydration although food has not been tasting good.  She has lost 1 pound in the last couple of weeks.  Nosebleeds are occasional and intermittent when she blows her nose but no episodes of spontaneous nosebleeds.  She has had some trouble sleeping and Ativan helps her to sleep for about 3-4 hours.  ECOG PS- 0 Pain scale- 0 Opioid associated constipation- no  Review of systems- Review of Systems  Constitutional: Positive for malaise/fatigue. Negative for chills, fever and weight loss.  HENT: Positive for nosebleeds. Negative for congestion and ear discharge.        Lack of taste sensation  Eyes: Negative for blurred vision.  Respiratory: Negative for cough, hemoptysis, sputum production, shortness of breath and wheezing.   Cardiovascular: Negative for chest pain, palpitations, orthopnea and claudication.  Gastrointestinal: Negative for abdominal pain, blood in stool, constipation, diarrhea, heartburn, melena, nausea and vomiting.  Genitourinary: Negative for dysuria, flank pain, frequency, hematuria and urgency.  Musculoskeletal: Negative for back pain, joint pain and myalgias.  Skin: Negative for rash.  Neurological: Negative for dizziness, tingling, focal weakness, seizures, weakness and headaches.  Endo/Heme/Allergies: Does not bruise/bleed easily.  Psychiatric/Behavioral: Negative for depression and suicidal ideas. The patient does not have insomnia.       Allergies  Allergen Reactions  . Cefdinir Swelling    Swelling in mouth   . Cephalosporins Swelling  . Dairy Aid [Lactase] Swelling    Milk and milk products cause swelling and tingling of tongue  . Flagyl [Metronidazole] Swelling  Mouth and throat and ears go red and burn  . Lactose Intolerance (Gi) Other (See Comments)    GI upset.  Eats only gluten free  . Penicillins Rash     Rash/hives  . Phenylephrine-Guaifenesin Anaphylaxis    Night terrors  . Sulfonamide Derivatives Rash    Rash/itching.  From head to her toes, the itching was horrible. Penicillin is the worst  . Tape Rash    Adhesives all cause a problem severely.  It doesn't matter if it is paper tape or not.  Marland Kitchen Dexilant [Dexlansoprazole]     Pressure in head like head is in a vice and being squeezed  . Entex Lq [Phenylephrine-Guaifenesin]     Night terrors  . Other Other (See Comments)    Flu vaccine:  Weakness, dizziness, tia type symptoms.    . Oxycodone Nausea And Vomiting    And itching  . Succinylcholine Other (See Comments)    Trouble waking up  . Codeine Nausea And Vomiting  . Guaifenesin & Derivatives Other (See Comments)    Night terrors  . Lactobacillus Itching  . Naproxen Sodium     Itching/rash  . Pseudoephedrine Rash  . Sulfa Antibiotics Rash  . Tetracyclines & Related Other (See Comments)    Was taking flagyl at the same time; unsure which caused the swelling  Swelling of throat. Pharmacist states that he believes it was the flagyl and not the tetracycline  . Wheat Bran Other (See Comments)    Upset stomach. Now eats gluten free     Past Medical History:  Diagnosis Date  . AA (aortic aneurysm) ([place])    a. 04/2011 Echo: Ao Root: 4.1cm, Asc Ao 4.7cm.  . Anxiety   . AV block, 2nd degree    a. 05/2005 - s/p MDT Adapta ADDR01 Dual Chamber PPM  . Bicuspid aortic valve   . Cancer ([place])   . Chest pain    a. 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  . Depression   . Expressive aphasia    a. ongoing since 04/2011 - seen by neurology - ? TIA vs. Migraine  . Facial numbness    a. ongoing since 04/2011 - seen by neurology - ? TIA vs. Migraine  . GERD (gastroesophageal reflux disease)   . Low blood pressure   . Presence of permanent cardiac pacemaker   . 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 Syncope    a. 04/2011 Echo: EF 55-65%, No RWMA, Gr 1 DD.     Past  Surgical History:  Procedure Laterality Date  . ABDOMINAL WALL MESH  REMOVAL    . bladder tack    . cataract Bilateral 2004  . CHOLECYSTECTOMY  2000  . DILATION AND CURETTAGE OF UTERUS    . DIRECT LARYNGOSCOPY Right 06/16/2017   Procedure: MICRO DIRECT LARYNGOSCOPY WITH BIOPSY OF RIGHT BASE OF TONGUE;  Surgeon: Carloyn Manner, MD;  Location: [facility];  Service: ENT;  Laterality: Right;  . EP IMPLANTABLE DEVICE N/A 06/04/2015   Procedure: PPM Generator Changeout;  Surgeon: Deboraha Sprang, MD;  Location: [facility];  Service: Cardiovascular;  Laterality: N/A;  . ESOPHAGOGASTRODUODENOSCOPY (EGD) WITH PROPOFOL N/A 12/18/2015   Procedure: ESOPHAGOGASTRODUODENOSCOPY (EGD) WITH gastric biopsy and dilation;  Surgeon: Lucilla Lame, MD;  Location: [facility];  Service: Endoscopy;  Laterality: N/A;  . EYE SURGERY    . INSERT / REPLACE / REMOVE PACEMAKER    . IR FLUORO GUIDE PORT INSERTION RIGHT  06/22/2017  . PACEMAKER INSERTION  2006   [employer]onic Adapta ADDR01  . TONSILLECTOMY    . TONSILLECTOMY    . VAGINAL HYSTERECTOMY  2013   mad/cope    Social History   Socioeconomic History  . Marital status: Married    Spouse name: Not on file  . Number of children: Not on file  . Years of education: Not on file  . Highest education level: Not on file  Social Needs  . Financial resource strain: Not on file  . Food insecurity - worry: Not on file  . Food insecurity - inability: Not on file  . Transportation needs - medical: Not on file  . Transportation needs - non-medical: Not on file  Occupational History  . Not on file  Tobacco Use  . Smoking status: Never Smoker  . Smokeless tobacco: Never Used  . Tobacco comment: tobacco use -no  Substance and Sexual Activity  . Alcohol use: No  . Drug use: No  . Sexual activity: Yes    Birth control/protection: Surgical  Other Topics Concern  . Not on file  Social History Narrative   Lives in [place] with husband.  Works out  regularly.  Works as a Geophysicist/field seismologist - owns own business.     Family History  Problem Relation Age of Onset  . COPD Mother        alive @ 27  . Atrial fibrillation Mother   . Stroke Father        died @ 83  . Breast cancer Maternal Aunt 70  . COPD Maternal Grandmother   . Ovarian cancer Neg Hx   . Diabetes Neg Hx      Current Outpatient Medications:  .  aspirin 81 MG tablet, Take 81 mg by mouth daily., Disp: , Rfl:  .  b complex vitamins tablet, Take 1 tablet by mouth daily., Disp: , Rfl:  .  BLACK ELDERBERRY,BERRY-FLOWER, PO, Take 1 capsule by mouth daily., Disp: , Rfl:  .  CRANBERRY SOFT PO, Take 2 each by mouth daily. , Disp: , Rfl:  .  dexamethasone (DECADRON) 4 MG tablet, Take 2 tablets by mouth once a day on the day after chemotherapy and then take 2 tablets two times a day for 2 days. Take with food., Disp: 30 tablet, Rfl: 1 .  DHEA 10 MG TABS, Take 10 mg by mouth every morning. (Patient not taking: Reported on 07/15/2017), Disp: 90 tablet, Rfl: 3 .  Digestive Enzymes (DIGESTIVE ENZYME PO), Take 1-2 capsules by mouth daily., Disp: , Rfl:  .  fexofenadine (ALLEGRA) 60 MG tablet, Take 60 mg by mouth daily as needed. , Disp: , Rfl:  .  fluconazole (DIFLUCAN) 100 MG tablet, Take 1 tablet (100 mg total) by mouth daily., Disp: 5 tablet, Rfl: 0 .  fluticasone (FLONASE SENSIMIST) 27.5 MCG/SPRAY nasal spray, Place 1 spray into the nose daily as needed. , Disp: , Rfl:  .  HYDROcodone-acetaminophen (HYCET) 7.5-325 mg/15 ml solution, Take 10 mLs by mouth 4 (four) times daily as needed for moderate pain., Disp: 300 mL, Rfl: 0 .  ibuprofen (ADVIL,MOTRIN) 200 MG tablet, Takes 2-3 tablets as needed., Disp: , Rfl:  .  lidocaine-prilocaine (EMLA) cream, Apply to affected area once, Disp: 30 g, Rfl: 3 .  loratadine (CLARITIN) 10 MG tablet, Take 10 mg by mouth daily as needed. , Disp: , Rfl:  .  LORazepam (ATIVAN) 0.5 MG tablet, Take 1 tablet (0.5 mg total) by mouth every 6 (six)  hours as needed  (Nausea or vomiting)., Disp: 30 tablet, Rfl: 0 .  magic mouthwash SOLN, Take 5 mLs by mouth 4 (four) times daily as needed for mouth pain. Swish and swallow, Disp: 420 mL, Rfl: 1 .  nitroGLYCERIN (NITROSTAT) 0.4 MG SL tablet, Place 1 tablet (0.4 mg total) under the tongue every 5 (five) minutes as needed for chest pain., Disp: 25 tablet, Rfl: 3 .  NON FORMULARY, Take by mouth daily. Bone Up (calcium) , Disp: , Rfl:  .  ondansetron (ZOFRAN) 4 MG tablet, Take 1 tablet (4 mg total) by mouth every 8 (eight) hours as needed for up to 10 doses for nausea or vomiting., Disp: 20 tablet, Rfl: 0 .  ondansetron (ZOFRAN) 8 MG tablet, Take 1 tablet (8 mg total) by mouth 2 (two) times daily as needed. Start on the third day after chemotherapy., Disp: 30 tablet, Rfl: 1 .  Polyethyl Glycol-Propyl Glycol (SYSTANE) 0.4-0.3 % GEL ophthalmic gel, Place 1 application into both eyes daily as needed., Disp: , Rfl:  .  PRESCRIPTION MEDICATION, Place 1 tablet vaginally 3 (three) times a week. E3 1mg  and Testosterone 1mg  compound, Disp: , Rfl:  .  prochlorperazine (COMPAZINE) 10 MG tablet, Take 1 tablet (10 mg total) by mouth every 6 (six) hours as needed (Nausea or vomiting)., Disp: 30 tablet, Rfl: 1 .  sucralfate (CARAFATE) 1 g tablet, Take 1 tablet (1 g total) by mouth 3 (three) times daily. Dissolve in 2-3 tbsp warm water, swish and swallow., Disp: 90 tablet, Rfl: 3  Physical exam:  Vitals:   07/29/17 0908  BP: 105/73  Pulse: 85  Resp: 18  Temp: 98.3 F (36.8 C)  TempSrc: Tympanic  Weight: 172 lb (78 kg)   Physical Exam  Constitutional: She is oriented to person, place, and time and well-developed, well-nourished, and in no distress.  HENT:  Head: Normocephalic and atraumatic.  Mouth/Throat: Oropharynx is clear and moist.  Right-sided cervical adenopathy appears significantly smaller and barely palpable  Eyes: EOM are normal. Pupils are equal, round, and reactive to light.  Neck: Normal range of motion.    Cardiovascular: Normal rate, regular rhythm and normal heart sounds.  Pulmonary/Chest: Effort normal and breath sounds normal.  Abdominal: Soft. Bowel sounds are normal.  Neurological: She is alert and oriented to person, place, and time.  Skin: Skin is warm and dry.     CMP Latest Ref Rng & Units 07/29/2017  Glucose 65 - 99 mg/dL 98  BUN 6 - 20 mg/dL 14  Creatinine 0.44 - 1.00 mg/dL 0.68  Sodium 135 - 145 mmol/L 137  Potassium 3.5 - 5.1 mmol/L 3.9  Chloride 101 - 111 mmol/L 103  CO2 22 - 32 mmol/L 26  Calcium 8.9 - 10.3 mg/dL 8.5(L)  Total Protein 6.5 - 8.1 g/dL 5.9(L)  Total Bilirubin 0.3 - 1.2 mg/dL 1.2  Alkaline Phos 38 - 126 U/L 67  AST 15 - 41 U/L 18  ALT 14 - 54 U/L 23   CBC Latest Ref Rng & Units 07/29/2017  WBC 3.6 - 11.0 K/uL 5.9  Hemoglobin 12.0 - 16.0 g/dL 12.2  Hematocrit 35.0 - 47.0 % 35.8  Platelets 150 - 440 K/uL 119(L)      Assessment and plan- Patient is a 62 y.o. 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  I will see her back in 1 weeks time for cycle 5 of cisplatin  Mild thrombocytopenia: Likely chemotherapy-induced continue to monitor  Nosebleeds: Occasional and intermittent.  She will continue nasal saline sprays and humidifier.  Hold off on Afrin spray at this time  Insomnia: She will continue to take Ativan at night and she can try melatonin to see if it helps   Visit Diagnosis 1. Squamous cell carcinoma of oropharynx ([place])   2. Encounter for antineoplastic chemotherapy   3. Chemotherapy-induced thrombocytopenia   4. Insomnia, unspecified type      Dr. Randa Evens, MD, MPH [facility] Pager- [phone] 07/29/2017 10:19 AM

## 2017-07-29 NOTE — Progress Notes (Signed)
Patient is here today for follow up, she mentions ringing in her ears. She mentions questions about her lorazepam. Judeen Hammans was going to ask about this. She also just started having a cough today.

## 2017-08-01 ENCOUNTER — Ambulatory Visit
Admission: RE | Admit: 2017-08-01 | Discharge: 2017-08-01 | Disposition: A | Discharge status: Home / Self Care | Payer: BLUE CROSS/BLUE SHIELD | Source: Ambulatory Visit | Attending: Radiation Oncology | Admitting: Radiation Oncology

## 2017-08-01 DIAGNOSIS — F40240 Claustrophobia: Secondary | ICD-10-CM | POA: Diagnosis not present

## 2017-08-01 DIAGNOSIS — Q231 Congenital insufficiency of aortic valve: Secondary | ICD-10-CM | POA: Diagnosis not present

## 2017-08-01 DIAGNOSIS — R4701 Aphasia: Secondary | ICD-10-CM | POA: Diagnosis not present

## 2017-08-01 DIAGNOSIS — C01 Malignant neoplasm of base of tongue: Secondary | ICD-10-CM | POA: Diagnosis not present

## 2017-08-01 DIAGNOSIS — K219 Gastro-esophageal reflux disease without esophagitis: Secondary | ICD-10-CM | POA: Diagnosis not present

## 2017-08-01 DIAGNOSIS — I959 Hypotension, unspecified: Secondary | ICD-10-CM | POA: Diagnosis not present

## 2017-08-01 DIAGNOSIS — F419 Anxiety disorder, unspecified: Secondary | ICD-10-CM | POA: Diagnosis not present

## 2017-08-01 DIAGNOSIS — Z7982 Long term (current) use of aspirin: Secondary | ICD-10-CM | POA: Diagnosis not present

## 2017-08-01 DIAGNOSIS — Z510 Encounter for antineoplastic radiation therapy: Secondary | ICD-10-CM | POA: Diagnosis not present

## 2017-08-01 DIAGNOSIS — Z803 Family history of malignant neoplasm of breast: Secondary | ICD-10-CM | POA: Diagnosis not present

## 2017-08-01 DIAGNOSIS — R079 Chest pain, unspecified: Secondary | ICD-10-CM | POA: Diagnosis not present

## 2017-08-01 DIAGNOSIS — I714 Abdominal aortic aneurysm, without rupture: Secondary | ICD-10-CM | POA: Diagnosis not present

## 2017-08-01 DIAGNOSIS — Z79899 Other long term (current) drug therapy: Secondary | ICD-10-CM | POA: Diagnosis not present

## 2017-08-01 DIAGNOSIS — Z950 Presence of cardiac pacemaker: Secondary | ICD-10-CM | POA: Diagnosis not present

## 2017-08-01 DIAGNOSIS — F329 Major depressive disorder, single episode, unspecified: Secondary | ICD-10-CM | POA: Diagnosis not present

## 2017-08-02 ENCOUNTER — Ambulatory Visit
Admission: RE | Admit: 2017-08-02 | Discharge: 2017-08-02 | Disposition: A | Discharge status: Home / Self Care | Payer: BLUE CROSS/BLUE SHIELD | Source: Ambulatory Visit | Attending: Radiation Oncology | Admitting: Radiation Oncology

## 2017-08-02 DIAGNOSIS — Q231 Congenital insufficiency of aortic valve: Secondary | ICD-10-CM | POA: Diagnosis not present

## 2017-08-02 DIAGNOSIS — I714 Abdominal aortic aneurysm, without rupture: Secondary | ICD-10-CM | POA: Diagnosis not present

## 2017-08-02 DIAGNOSIS — F40240 Claustrophobia: Secondary | ICD-10-CM | POA: Diagnosis not present

## 2017-08-02 DIAGNOSIS — F329 Major depressive disorder, single episode, unspecified: Secondary | ICD-10-CM | POA: Diagnosis not present

## 2017-08-02 DIAGNOSIS — Z950 Presence of cardiac pacemaker: Secondary | ICD-10-CM | POA: Diagnosis not present

## 2017-08-02 DIAGNOSIS — I959 Hypotension, unspecified: Secondary | ICD-10-CM | POA: Diagnosis not present

## 2017-08-02 DIAGNOSIS — K219 Gastro-esophageal reflux disease without esophagitis: Secondary | ICD-10-CM | POA: Diagnosis not present

## 2017-08-02 DIAGNOSIS — C01 Malignant neoplasm of base of tongue: Secondary | ICD-10-CM | POA: Diagnosis not present

## 2017-08-02 DIAGNOSIS — Z7982 Long term (current) use of aspirin: Secondary | ICD-10-CM | POA: Diagnosis not present

## 2017-08-02 DIAGNOSIS — Z510 Encounter for antineoplastic radiation therapy: Secondary | ICD-10-CM | POA: Diagnosis not present

## 2017-08-02 DIAGNOSIS — F419 Anxiety disorder, unspecified: Secondary | ICD-10-CM | POA: Diagnosis not present

## 2017-08-02 DIAGNOSIS — Z803 Family history of malignant neoplasm of breast: Secondary | ICD-10-CM | POA: Diagnosis not present

## 2017-08-02 DIAGNOSIS — R4701 Aphasia: Secondary | ICD-10-CM | POA: Diagnosis not present

## 2017-08-02 DIAGNOSIS — Z79899 Other long term (current) drug therapy: Secondary | ICD-10-CM | POA: Diagnosis not present

## 2017-08-02 DIAGNOSIS — R079 Chest pain, unspecified: Secondary | ICD-10-CM | POA: Diagnosis not present

## 2017-08-03 ENCOUNTER — Ambulatory Visit
Admission: RE | Admit: 2017-08-03 | Discharge: 2017-08-03 | Disposition: A | Discharge status: Home / Self Care | Payer: BLUE CROSS/BLUE SHIELD | Source: Ambulatory Visit | Attending: Radiation Oncology | Admitting: Radiation Oncology

## 2017-08-03 ENCOUNTER — Other Ambulatory Visit: Payer: Self-pay | Admitting: *Deleted

## 2017-08-03 DIAGNOSIS — Z803 Family history of malignant neoplasm of breast: Secondary | ICD-10-CM | POA: Diagnosis not present

## 2017-08-03 DIAGNOSIS — F419 Anxiety disorder, unspecified: Secondary | ICD-10-CM | POA: Diagnosis not present

## 2017-08-03 DIAGNOSIS — F329 Major depressive disorder, single episode, unspecified: Secondary | ICD-10-CM | POA: Diagnosis not present

## 2017-08-03 DIAGNOSIS — R079 Chest pain, unspecified: Secondary | ICD-10-CM | POA: Diagnosis not present

## 2017-08-03 DIAGNOSIS — Z510 Encounter for antineoplastic radiation therapy: Secondary | ICD-10-CM | POA: Diagnosis not present

## 2017-08-03 DIAGNOSIS — Q231 Congenital insufficiency of aortic valve: Secondary | ICD-10-CM | POA: Diagnosis not present

## 2017-08-03 DIAGNOSIS — Z79899 Other long term (current) drug therapy: Secondary | ICD-10-CM | POA: Diagnosis not present

## 2017-08-03 DIAGNOSIS — R4701 Aphasia: Secondary | ICD-10-CM | POA: Diagnosis not present

## 2017-08-03 DIAGNOSIS — I959 Hypotension, unspecified: Secondary | ICD-10-CM | POA: Diagnosis not present

## 2017-08-03 DIAGNOSIS — Z950 Presence of cardiac pacemaker: Secondary | ICD-10-CM | POA: Diagnosis not present

## 2017-08-03 DIAGNOSIS — C01 Malignant neoplasm of base of tongue: Secondary | ICD-10-CM | POA: Diagnosis not present

## 2017-08-03 DIAGNOSIS — F40240 Claustrophobia: Secondary | ICD-10-CM | POA: Diagnosis not present

## 2017-08-03 DIAGNOSIS — I714 Abdominal aortic aneurysm, without rupture: Secondary | ICD-10-CM | POA: Diagnosis not present

## 2017-08-03 DIAGNOSIS — Z7982 Long term (current) use of aspirin: Secondary | ICD-10-CM | POA: Diagnosis not present

## 2017-08-03 DIAGNOSIS — K219 Gastro-esophageal reflux disease without esophagitis: Secondary | ICD-10-CM | POA: Diagnosis not present

## 2017-08-03 MED ORDER — LEVOFLOXACIN 500 MG PO TABS: 500.0000 mg | ORAL_TABLET | Freq: Every day | ORAL | 0 refills | Status: DC

## 2017-08-03 NOTE — Telephone Encounter (Signed)
Pt had texted me yesterday about coughing up yellow sputum. Drinking fluids and eating. No fever. Feels lousy.  Offered atb after speaking to dr Janese Banks. Patient wanted to wait and see. This morning she texted me and she is ready for the atb. She is coughing up green sputum today. Still no fever. Drinking still.  She wanted to know if she should come Friday for chemo. I told her I would ask rao tom. And let her know what the answer is.  I told her that she would probably need to come in because she may needs fluids

## 2017-08-04 ENCOUNTER — Other Ambulatory Visit: Payer: Self-pay | Admitting: *Deleted

## 2017-08-04 ENCOUNTER — Ambulatory Visit
Admission: RE | Admit: 2017-08-04 | Discharge: 2017-08-04 | Disposition: A | Discharge status: Home / Self Care | Payer: BLUE CROSS/BLUE SHIELD | Source: Ambulatory Visit | Attending: Radiation Oncology | Admitting: Radiation Oncology

## 2017-08-04 ENCOUNTER — Telehealth: Payer: Self-pay | Admitting: *Deleted

## 2017-08-04 ENCOUNTER — Ambulatory Visit: Payer: BLUE CROSS/BLUE SHIELD

## 2017-08-04 ENCOUNTER — Inpatient Hospital Stay: Payer: BLUE CROSS/BLUE SHIELD

## 2017-08-04 ENCOUNTER — Encounter: Payer: Self-pay | Admitting: Nurse Practitioner

## 2017-08-04 ENCOUNTER — Inpatient Hospital Stay (HOSPITAL_BASED_OUTPATIENT_CLINIC_OR_DEPARTMENT_OTHER): Payer: BLUE CROSS/BLUE SHIELD | Admitting: Nurse Practitioner

## 2017-08-04 VITALS — BP 138/82 | HR 76 | Temp 97.9°F | Resp 18 | Ht 64.0 in | Wt 168.8 lb

## 2017-08-04 DIAGNOSIS — Z950 Presence of cardiac pacemaker: Secondary | ICD-10-CM | POA: Diagnosis not present

## 2017-08-04 DIAGNOSIS — R531 Weakness: Secondary | ICD-10-CM

## 2017-08-04 DIAGNOSIS — B370 Candidal stomatitis: Secondary | ICD-10-CM | POA: Diagnosis not present

## 2017-08-04 DIAGNOSIS — K219 Gastro-esophageal reflux disease without esophagitis: Secondary | ICD-10-CM

## 2017-08-04 DIAGNOSIS — Z79899 Other long term (current) drug therapy: Secondary | ICD-10-CM

## 2017-08-04 DIAGNOSIS — R4701 Aphasia: Secondary | ICD-10-CM

## 2017-08-04 DIAGNOSIS — D696 Thrombocytopenia, unspecified: Secondary | ICD-10-CM | POA: Diagnosis not present

## 2017-08-04 DIAGNOSIS — I959 Hypotension, unspecified: Secondary | ICD-10-CM | POA: Diagnosis not present

## 2017-08-04 DIAGNOSIS — Z7982 Long term (current) use of aspirin: Secondary | ICD-10-CM | POA: Diagnosis not present

## 2017-08-04 DIAGNOSIS — R5383 Other fatigue: Secondary | ICD-10-CM

## 2017-08-04 DIAGNOSIS — E860 Dehydration: Secondary | ICD-10-CM

## 2017-08-04 DIAGNOSIS — R040 Epistaxis: Secondary | ICD-10-CM

## 2017-08-04 DIAGNOSIS — R200 Anesthesia of skin: Secondary | ICD-10-CM

## 2017-08-04 DIAGNOSIS — C770 Secondary and unspecified malignant neoplasm of lymph nodes of head, face and neck: Secondary | ICD-10-CM

## 2017-08-04 DIAGNOSIS — R079 Chest pain, unspecified: Secondary | ICD-10-CM | POA: Diagnosis not present

## 2017-08-04 DIAGNOSIS — F329 Major depressive disorder, single episode, unspecified: Secondary | ICD-10-CM

## 2017-08-04 DIAGNOSIS — Q231 Congenital insufficiency of aortic valve: Secondary | ICD-10-CM | POA: Diagnosis not present

## 2017-08-04 DIAGNOSIS — F419 Anxiety disorder, unspecified: Secondary | ICD-10-CM

## 2017-08-04 DIAGNOSIS — C109 Malignant neoplasm of oropharynx, unspecified: Secondary | ICD-10-CM

## 2017-08-04 DIAGNOSIS — Z5111 Encounter for antineoplastic chemotherapy: Secondary | ICD-10-CM

## 2017-08-04 DIAGNOSIS — I714 Abdominal aortic aneurysm, without rupture: Secondary | ICD-10-CM | POA: Diagnosis not present

## 2017-08-04 DIAGNOSIS — F40240 Claustrophobia: Secondary | ICD-10-CM | POA: Diagnosis not present

## 2017-08-04 DIAGNOSIS — J0110 Acute frontal sinusitis, unspecified: Secondary | ICD-10-CM

## 2017-08-04 DIAGNOSIS — R634 Abnormal weight loss: Secondary | ICD-10-CM | POA: Diagnosis not present

## 2017-08-04 DIAGNOSIS — J019 Acute sinusitis, unspecified: Secondary | ICD-10-CM | POA: Diagnosis not present

## 2017-08-04 DIAGNOSIS — G4700 Insomnia, unspecified: Secondary | ICD-10-CM

## 2017-08-04 DIAGNOSIS — C01 Malignant neoplasm of base of tongue: Secondary | ICD-10-CM | POA: Diagnosis not present

## 2017-08-04 DIAGNOSIS — Z803 Family history of malignant neoplasm of breast: Secondary | ICD-10-CM | POA: Diagnosis not present

## 2017-08-04 DIAGNOSIS — Z510 Encounter for antineoplastic radiation therapy: Secondary | ICD-10-CM | POA: Diagnosis not present

## 2017-08-04 LAB — CBC WITH DIFFERENTIAL/PLATELET
Basophils Absolute: 0 10*3/uL (ref 0–0.1)
Basophils Relative: 0 %
EOS ABS: 0 10*3/uL (ref 0–0.7)
EOS PCT: 0 %
HCT: 35.2 % (ref 35.0–47.0)
Hemoglobin: 11.9 g/dL — ABNORMAL LOW (ref 12.0–16.0)
LYMPHS PCT: 9 %
Lymphs Abs: 0.3 10*3/uL — ABNORMAL LOW (ref 1.0–3.6)
MCH: 30.8 pg (ref 26.0–34.0)
MCHC: 33.9 g/dL (ref 32.0–36.0)
MCV: 90.9 fL (ref 80.0–100.0)
MONO ABS: 0.3 10*3/uL (ref 0.2–0.9)
Monocytes Relative: 9 %
Neutro Abs: 2.8 10*3/uL (ref 1.4–6.5)
Neutrophils Relative %: 82 %
PLATELETS: 64 10*3/uL — AB (ref 150–440)
RBC: 3.87 MIL/uL (ref 3.80–5.20)
RDW: 12.9 % (ref 11.5–14.5)
WBC: 3.4 10*3/uL — ABNORMAL LOW (ref 3.6–11.0)

## 2017-08-04 LAB — COMPREHENSIVE METABOLIC PANEL
ALK PHOS: 66 U/L (ref 38–126)
ALT: 22 U/L (ref 14–54)
AST: 18 U/L (ref 15–41)
Albumin: 3.2 g/dL — ABNORMAL LOW (ref 3.5–5.0)
Anion gap: 8 (ref 5–15)
BILIRUBIN TOTAL: 1 mg/dL (ref 0.3–1.2)
BUN: 14 mg/dL (ref 6–20)
CO2: 28 mmol/L (ref 22–32)
CREATININE: 0.62 mg/dL (ref 0.44–1.00)
Calcium: 8.4 mg/dL — ABNORMAL LOW (ref 8.9–10.3)
Chloride: 103 mmol/L (ref 101–111)
GFR calc Af Amer: >60 mL/min (ref >60)
GFR calc non Af Amer: >60 mL/min (ref >60)
GLUCOSE: 92 mg/dL (ref 65–99)
POTASSIUM: 3.8 mmol/L (ref 3.5–5.1)
Sodium: 139 mmol/L (ref 135–145)
TOTAL PROTEIN: 5.6 g/dL — AB (ref 6.5–8.1)

## 2017-08-04 LAB — MAGNESIUM: Magnesium: 2.2 mg/dL (ref 1.7–2.4)

## 2017-08-04 MED ORDER — ONDANSETRON HCL 4 MG/2ML IJ SOLN
8.0000 mg | Freq: Once | INTRAMUSCULAR | Status: AC
  Administered 2017-08-04: 8 mg via INTRAVENOUS
  Filled 2017-08-04: qty 4

## 2017-08-04 MED ORDER — SODIUM CHLORIDE 0.9 % IV SOLN
Freq: Once | INTRAVENOUS | Status: AC
  Administered 2017-08-04: 12:00:00 via INTRAVENOUS
  Filled 2017-08-04: qty 1000

## 2017-08-04 NOTE — Progress Notes (Signed)
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 Pryor Ochoa for evaluation of right neck mass that had been present for 1 year and growing.  Had right ear pain.  Never smoker.  Ultrasound on 05/30/17 showed single enlarged lymph node in right neck measuring 4.8 x 1.7 x 3.0 cm.  Patient underwent a guided lymph node biopsy of cervical lymph node which showed metastatic squamous cell carcinoma.  Staining- P 16 +, sox 10 negative. PET/CT on 06/09/2017 showed intensely hypermetabolic enlarged right, level 2 cervical LN. No clear site of primary malignancy but potential site of investigation at right base of tongue with SUV 4.5. Biopsy of right tongue base positive for SCC. Plan to proceed with concurrent cisplatin/RT. Baseline audiogram was normal.  Complications with treatment: dysgevsia, nosebleeds, insomnia, fatigue  Interval history-patient presents to symptom management clinic for complaints of sinus congestion for the past 2 weeks, gradually worsening.  She has poor taste, nausea, cough, sputum, emesis, nosebleeds, or oral intake with weight loss. She was started on levaquin (500mg  daily for 7 days) yesterday by Dr. Janese Banks. Symptoms have gradually worsened. Feeling dizzy and dehydrated today. Poor oral intake chronically. Seeing dietary today.    ECOG FS:1 - Symptomatic but completely ambulatory  Review of systems- Review of Systems  Constitutional: Positive for malaise/fatigue and weight loss. Negative  for chills, diaphoresis and fever.  HENT: Positive for congestion, ear pain, hearing loss, nosebleeds, sinus pain and sore throat. Negative for ear discharge and tinnitus.   Eyes: Negative for double vision, photophobia, pain and redness.  Respiratory: Positive for cough and sputum production. Negative for hemoptysis, shortness of breath, wheezing and stridor.   Cardiovascular: Negative for chest pain, palpitations, claudication and leg swelling.  Gastrointestinal: Positive for nausea and vomiting. Negative for abdominal pain, blood in stool, constipation, diarrhea and melena.  Genitourinary: Negative.   Musculoskeletal: Negative.   Skin: Negative.   Neurological: Positive for weakness and headaches. Negative for dizziness.  Endo/Heme/Allergies: Positive for environmental allergies. Bruises/bleeds easily.  Psychiatric/Behavioral: The patient has insomnia.      Current treatment-weekly cisplatin (last 07/29/17) with concurrent radiation (last today)  Allergies  Allergen Reactions  . Cefdinir Swelling    Swelling in mouth   . Cephalosporins Swelling  . Dairy Aid [Lactase] Swelling    Milk and milk products cause swelling and tingling of tongue  . Flagyl [Metronidazole] Swelling    Mouth and throat and ears go red and burn  . Lactose Intolerance (Gi) Other (See Comments)    GI upset.  Eats only gluten free  . Penicillins Rash    Rash/hives  . Phenylephrine-Guaifenesin Anaphylaxis    Night terrors  . Sulfonamide Derivatives Rash    Rash/itching.  From head to her toes, the itching was horrible. Penicillin is the worst  . Tape Rash    Adhesives all cause a problem severely.  It doesn't matter if it is paper tape or not.  Marland Kitchen Dexilant [Dexlansoprazole]     Pressure in head like head is in a vice and  being squeezed  . Entex Lq [Phenylephrine-Guaifenesin]     Night terrors  . Other Other (See Comments)    Flu vaccine:  Weakness, dizziness, tia type symptoms.    . Oxycodone Nausea And  Vomiting    And itching  . Succinylcholine Other (See Comments)    Trouble waking up  . Codeine Nausea And Vomiting  . Guaifenesin & Derivatives Other (See Comments)    Night terrors  . Lactobacillus Itching  . Naproxen Sodium     Itching/rash  . Pseudoephedrine Rash  . Sulfa Antibiotics Rash  . Tetracyclines & Related Other (See Comments)    Was taking flagyl at the same time; unsure which caused the swelling  Swelling of throat. Pharmacist states that he believes it was the flagyl and not the tetracycline  . Wheat Bran Other (See Comments)    Upset stomach. Now eats gluten free     Past Medical History:  Diagnosis Date  . AA (aortic aneurysm) ([place])    a. 04/2011 Echo: Ao Root: 4.1cm, Asc Ao 4.7cm.  . Anxiety   . AV block, 2nd degree    a. 05/2005 - s/p MDT Adapta ADDR01 Dual Chamber PPM  . Bicuspid aortic valve   . Cancer ([place])   . Chest pain    a. 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  . Depression   . Expressive aphasia    a. ongoing since 04/2011 - seen by neurology - ? TIA vs. Migraine  . Facial numbness    a. ongoing since 04/2011 - seen by neurology - ? TIA vs. Migraine  . GERD (gastroesophageal reflux disease)   . Low blood pressure   . Presence of permanent cardiac pacemaker   . 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 Syncope    a. 04/2011 Echo: EF 55-65%, No RWMA, Gr 1 DD.     Past Surgical History:  Procedure Laterality Date  . ABDOMINAL WALL MESH  REMOVAL    . bladder tack    . cataract Bilateral 2004  . CHOLECYSTECTOMY  2000  . DILATION AND CURETTAGE OF UTERUS    . DIRECT LARYNGOSCOPY Right 06/16/2017   Procedure: MICRO DIRECT LARYNGOSCOPY WITH BIOPSY OF RIGHT BASE OF TONGUE;  Surgeon: Carloyn Manner, MD;  Location: [facility];  Service: ENT;  Laterality: Right;  . EP IMPLANTABLE DEVICE N/A 06/04/2015   Procedure: PPM Generator Changeout;  Surgeon: Deboraha Sprang, MD;  Location: [facility];  Service: Cardiovascular;   Laterality: N/A;  . ESOPHAGOGASTRODUODENOSCOPY (EGD) WITH PROPOFOL N/A 12/18/2015   Procedure: ESOPHAGOGASTRODUODENOSCOPY (EGD) WITH gastric biopsy and dilation;  Surgeon: Lucilla Lame, MD;  Location: [place];  Service: Endoscopy;  Laterality: N/A;  . EYE SURGERY    . INSERT / REPLACE / REMOVE PACEMAKER    . IR FLUORO GUIDE PORT INSERTION RIGHT  06/22/2017  . PACEMAKER INSERTION  2006   [employer] Adapta ADDR01  . TONSILLECTOMY    . TONSILLECTOMY    . VAGINAL HYSTERECTOMY  2013   mad/cope    Social History   Socioeconomic History  . Marital status: Married    Spouse name: Not on file  . Number of children: Not on file  . Years of education: Not on file  . Highest education level: Not on file  Social Needs  . Financial resource strain: Not on file  . Food insecurity - worry: Not on file  . Food insecurity - inability: Not on file  . Transportation  needs - medical: Not on file  . Transportation needs - non-medical: Not on file  Occupational History  . Not on file  Tobacco Use  . Smoking status: Never Smoker  . Smokeless tobacco: Never Used  . Tobacco comment: tobacco use -no  Substance and Sexual Activity  . Alcohol use: No  . Drug use: No  . Sexual activity: Yes    Birth control/protection: Surgical  Other Topics Concern  . Not on file  Social History Narrative   Lives in [place] with husband.  Works out regularly.  Works as a Geophysicist/field seismologist - owns own business.     Family History  Problem Relation Age of Onset  . COPD Mother        alive @ 46  . Atrial fibrillation Mother   . Stroke Father        died @ 52  . Breast cancer Maternal Aunt 70  . COPD Maternal Grandmother   . Ovarian cancer Neg Hx   . Diabetes Neg Hx      Current Outpatient Medications:  .  aspirin 81 MG tablet, Take 81 mg by mouth daily., Disp: , Rfl:  .  b complex vitamins tablet, Take 1 tablet by mouth daily., Disp: , Rfl:  .  BLACK ELDERBERRY,BERRY-FLOWER, PO, Take 1  capsule by mouth daily., Disp: , Rfl:  .  CRANBERRY SOFT PO, Take 2 each by mouth daily. , Disp: , Rfl:  .  dexamethasone (DECADRON) 4 MG tablet, Take 2 tablets by mouth once a day on the day after chemotherapy and then take 2 tablets two times a day for 2 days. Take with food., Disp: 30 tablet, Rfl: 1 .  Digestive Enzymes (DIGESTIVE ENZYME PO), Take 1-2 capsules by mouth daily., Disp: , Rfl:  .  fexofenadine (ALLEGRA) 60 MG tablet, Take 60 mg by mouth daily as needed. , Disp: , Rfl:  .  fluconazole (DIFLUCAN) 100 MG tablet, Take 1 tablet (100 mg total) by mouth daily., Disp: 5 tablet, Rfl: 0 .  fluticasone (FLONASE SENSIMIST) 27.5 MCG/SPRAY nasal spray, Place 1 spray into the nose daily as needed. , Disp: , Rfl:  .  HYDROcodone-acetaminophen (HYCET) 7.5-325 mg/15 ml solution, Take 10 mLs by mouth 4 (four) times daily as needed for moderate pain. (Patient not taking: Reported on 07/29/2017), Disp: 300 mL, Rfl: 0 .  ibuprofen (ADVIL,MOTRIN) 200 MG tablet, Takes 2-3 tablets as needed., Disp: , Rfl:  .  levofloxacin (LEVAQUIN) 500 MG tablet, Take 1 tablet (500 mg total) by mouth daily., Disp: 7 tablet, Rfl: 0 .  lidocaine-prilocaine (EMLA) cream, Apply to affected area once, Disp: 30 g, Rfl: 3 .  loratadine (CLARITIN) 10 MG tablet, Take 10 mg by mouth daily as needed. , Disp: , Rfl:  .  LORazepam (ATIVAN) 0.5 MG tablet, Take 1 tablet (0.5 mg total) by mouth every 6 (six) hours as needed (Nausea or vomiting)., Disp: 30 tablet, Rfl: 0 .  magic mouthwash SOLN, Take 5 mLs by mouth 4 (four) times daily as needed for mouth pain. Swish and swallow, Disp: 420 mL, Rfl: 1 .  nitroGLYCERIN (NITROSTAT) 0.4 MG SL tablet, Place 1 tablet (0.4 mg total) under the tongue every 5 (five) minutes as needed for chest pain., Disp: 25 tablet, Rfl: 3 .  NON FORMULARY, Take by mouth daily. Bone Up (calcium) , Disp: , Rfl:  .  ondansetron (ZOFRAN) 4 MG tablet, Take 1 tablet (4 mg total) by mouth every 8 (eight) hours as needed  for up to 10 doses for nausea or vomiting. (Patient not taking: Reported on 07/29/2017), Disp: 20 tablet, Rfl: 0 .  ondansetron (ZOFRAN) 8 MG tablet, Take 1 tablet (8 mg total) by mouth 2 (two) times daily as needed. Start on the third day after chemotherapy. (Patient not taking: Reported on 07/29/2017), Disp: 30 tablet, Rfl: 1 .  Polyethyl Glycol-Propyl Glycol (SYSTANE) 0.4-0.3 % GEL ophthalmic gel, Place 1 application into both eyes daily as needed., Disp: , Rfl:  .  PRESCRIPTION MEDICATION, Place 1 tablet vaginally 3 (three) times a week. E3 1mg  and Testosterone 1mg  compound, Disp: , Rfl:  .  prochlorperazine (COMPAZINE) 10 MG tablet, Take 1 tablet (10 mg total) by mouth every 6 (six) hours as needed (Nausea or vomiting). (Patient not taking: Reported on 07/29/2017), Disp: 30 tablet, Rfl: 1 .  sucralfate (CARAFATE) 1 g tablet, Take 1 tablet (1 g total) by mouth 3 (three) times daily. Dissolve in 2-3 tbsp warm water, swish and swallow., Disp: 90 tablet, Rfl: 3  Physical exam:  Vitals:   08/04/17 1136  BP: 138/82  Pulse: 76  Resp: 18  Temp: 97.9 F (36.6 C)  TempSrc: Tympanic  Weight: 168 lb 12.8 oz (76.6 kg)  Height: 5\' 4"  (1.626 m)   Physical Exam  Constitutional: She is oriented to person, place, and time.  Well-developed. Appears stated age. Alone. Ill appearing.  HENT:  Head: Normocephalic and atraumatic.  Right Ear: External ear normal. No drainage. No mastoid tenderness. Tympanic membrane is bulging. Tympanic membrane is not injected and not erythematous. Decreased hearing is noted.  Left Ear: External ear normal. No drainage. No mastoid tenderness. Tympanic membrane is bulging. Tympanic membrane is not injected and not erythematous. Decreased hearing is noted.  Mouth/Throat: Posterior oropharyngeal erythema present.  Air bubbles bil ears, white spots on back of throat, red. Tonsils absent.   Eyes: Pupils are equal, round, and reactive to light. Right eye exhibits no discharge. Left  eye exhibits no discharge.  Neck: Normal range of motion. Neck supple.  Cardiovascular: Normal rate and regular rhythm.  Pulmonary/Chest: Effort normal and breath sounds normal. No stridor. She has no wheezes.  Abdominal: Soft. Bowel sounds are normal. She exhibits no distension.  Musculoskeletal: She exhibits no edema or deformity.  Lymphadenopathy:    She has cervical adenopathy.  Neurological: She is alert and oriented to person, place, and time.  Skin: Skin is warm and dry.  Psychiatric: Her mood appears anxious.     CMP Latest Ref Rng & Units 08/04/2017  Glucose 65 - 99 mg/dL 92  BUN 6 - 20 mg/dL 14  Creatinine 0.44 - 1.00 mg/dL 0.62  Sodium 135 - 145 mmol/L 139  Potassium 3.5 - 5.1 mmol/L 3.8  Chloride 101 - 111 mmol/L 103  CO2 22 - 32 mmol/L 28  Calcium 8.9 - 10.3 mg/dL 8.4(L)  Total Protein 6.5 - 8.1 g/dL 5.6(L)  Total Bilirubin 0.3 - 1.2 mg/dL 1.0  Alkaline Phos 38 - 126 U/L 66  AST 15 - 41 U/L 18  ALT 14 - 54 U/L 22   CBC Latest Ref Rng & Units 08/04/2017  WBC 3.6 - 11.0 K/uL 3.4(L)  Hemoglobin 12.0 - 16.0 g/dL 11.9(L)  Hematocrit 35.0 - 47.0 % 35.2(L)  Platelets 150 - 440 K/uL 64(L)   Magnesium- 2.2    Assessment and plan- Patient is a 62 y.o. female who presents to Symptom Management Clinic for concerns of cough and sinus congestion.   1.  Squamous cell carcinoma  of the tongue base-receiving concurrent platinum and radiation.  Last chemo on 07/29/17.  Last radiation today. Continue radiation as tolerated. Follow-up with Dr. Janese Banks.   2.  Acute bacterial sinusitis-patient started on Levaquin yesterday by Dr. Janese Banks.  Productive cough interfering with sleep.  Mild dehydration.  Will give IV fluids today in clinic in light of poor oral intake.  Discussed OTC medications including decongestants and antihistamines. Multiple drug allergies complicate care.   3.  Thrombocytopenia-platelet count today 64.  Hemoglobin 11.9 likely related to recent chemotherapy.  No evidence of  bleeding.  Continue to monitor.   Visit Diagnosis 1. Squamous cell carcinoma of oropharynx ([place])   2. Acute non-recurrent frontal sinusitis   3. Thrombocytopenia ([place])     Patient expressed understanding and was in agreement with this plan. She also understands that She can call clinic at any time with any questions, concerns, or complaints.   A total of (30) minutes of face-to-face time was spent with this patient with greater than 50% of that time in counseling and care-coordination.  [name], DNP, AGNP-C [place] at [facility] [phone] (office) 08/04/17 11:33 AM

## 2017-08-04 NOTE — Progress Notes (Signed)
Pt here with coughing up green sputum and then vomits up phlegm and sometimes other things.she is unable to drink and eat due to the above. sheis feeling weak.

## 2017-08-04 NOTE — Telephone Encounter (Signed)
Called to let pt know that her appt for fluids in am is 9:30 and then if she can stop at appts desk to get rest of schedule for pt. We now have it fixed. Call me for any questions.left all info as message on cell phone

## 2017-08-04 NOTE — Telephone Encounter (Signed)
-----  Message from Festus Holts sent at 08/04/2017  3:35 PM EST ----- Regarding: Appts All appts schd as requested.  Judeen Hammans, when you call patient, please remind her to stop at my desk for a copy of her schedule, which I printed for her convenience.    Thanks Verdis Frederickson ----- Message ----- From: Luella Cook, RN Sent: 08/04/2017   2:11 PM To: Festus Holts  Since we had to cancel patient for treatment tom. Except for her fluids she is getting tom. Please put her on for 2/4 to see NP Lauren and port labs: cbc/d, met c and treatment: cisplatin Then 2/8 see dr Janese Banks with labs out of her arm not the port: cbc/d, met c and then treatment of cisplatin only on 2/11  Once done please let me know and I will call her

## 2017-08-04 NOTE — Progress Notes (Signed)
Nutrition Follow-up:   Patient seen during infusion of IV fluids today.  Patient not feeling well.  Reports "I have a sinus infection, ear ache, no taste, thick saliva, poor appetite."  Reports that yesterday all she could eat was cooked turnips with butter and sugar.  Reports this am able to eat some oatmeal.  "I am trying to eat but I just can't."  Reports that Dr. Janese Banks is starting her on antibiotics.     Medications: reviewed  Labs: reviewed  Anthropometrics:   Weight decreased to 168 lb today from 175 lb 2 oz on 1/11.  Initial assessment wt of 177 lb on 12/11.    NUTRITION DIAGNOSIS: Predicted suboptimal energy intake continues as treatment progresses   MALNUTRITION DIAGNOSIS: continue to monitor   INTERVENTION:   Offered encouragement to patient today, tearful again during visit. RD able to obtain more [employer] samples of peptide 1.5, standard 1.0 formulas and provided to patient today.   Patient reports that she has ordered [employer] products to drink. Recipes given to patient today to use [employer] products with to increase calories and protein. Discussed strategies to help with thick saliva.  Handout given to patient for review.      MONITORING, EVALUATION, GOAL: weight trends, intake   NEXT VISIT: Jan 31 following radiation  Berneta Sconyers B. [name], [place], [place] Registered Dietitian [phone] (pager)

## 2017-08-05 ENCOUNTER — Inpatient Hospital Stay: Payer: BLUE CROSS/BLUE SHIELD | Admitting: Oncology

## 2017-08-05 ENCOUNTER — Inpatient Hospital Stay: Payer: BLUE CROSS/BLUE SHIELD

## 2017-08-05 ENCOUNTER — Ambulatory Visit
Admission: RE | Admit: 2017-08-05 | Discharge: 2017-08-05 | Disposition: A | Discharge status: Home / Self Care | Payer: BLUE CROSS/BLUE SHIELD | Source: Ambulatory Visit | Attending: Radiation Oncology | Admitting: Radiation Oncology

## 2017-08-05 DIAGNOSIS — E860 Dehydration: Secondary | ICD-10-CM | POA: Diagnosis not present

## 2017-08-05 DIAGNOSIS — Z7982 Long term (current) use of aspirin: Secondary | ICD-10-CM | POA: Diagnosis not present

## 2017-08-05 DIAGNOSIS — Z803 Family history of malignant neoplasm of breast: Secondary | ICD-10-CM | POA: Diagnosis not present

## 2017-08-05 DIAGNOSIS — F419 Anxiety disorder, unspecified: Secondary | ICD-10-CM | POA: Diagnosis not present

## 2017-08-05 DIAGNOSIS — Z950 Presence of cardiac pacemaker: Secondary | ICD-10-CM | POA: Diagnosis not present

## 2017-08-05 DIAGNOSIS — C01 Malignant neoplasm of base of tongue: Secondary | ICD-10-CM | POA: Diagnosis not present

## 2017-08-05 DIAGNOSIS — Z510 Encounter for antineoplastic radiation therapy: Secondary | ICD-10-CM | POA: Diagnosis not present

## 2017-08-05 DIAGNOSIS — R079 Chest pain, unspecified: Secondary | ICD-10-CM | POA: Diagnosis not present

## 2017-08-05 DIAGNOSIS — I714 Abdominal aortic aneurysm, without rupture: Secondary | ICD-10-CM | POA: Diagnosis not present

## 2017-08-05 DIAGNOSIS — C109 Malignant neoplasm of oropharynx, unspecified: Secondary | ICD-10-CM | POA: Diagnosis not present

## 2017-08-05 DIAGNOSIS — F40240 Claustrophobia: Secondary | ICD-10-CM | POA: Diagnosis not present

## 2017-08-05 DIAGNOSIS — R531 Weakness: Secondary | ICD-10-CM | POA: Diagnosis not present

## 2017-08-05 DIAGNOSIS — R5383 Other fatigue: Secondary | ICD-10-CM | POA: Diagnosis not present

## 2017-08-05 DIAGNOSIS — F329 Major depressive disorder, single episode, unspecified: Secondary | ICD-10-CM | POA: Diagnosis not present

## 2017-08-05 DIAGNOSIS — I959 Hypotension, unspecified: Secondary | ICD-10-CM | POA: Diagnosis not present

## 2017-08-05 DIAGNOSIS — J019 Acute sinusitis, unspecified: Secondary | ICD-10-CM | POA: Diagnosis not present

## 2017-08-05 DIAGNOSIS — K219 Gastro-esophageal reflux disease without esophagitis: Secondary | ICD-10-CM | POA: Diagnosis not present

## 2017-08-05 DIAGNOSIS — Z95828 Presence of other vascular implants and grafts: Secondary | ICD-10-CM

## 2017-08-05 DIAGNOSIS — Q231 Congenital insufficiency of aortic valve: Secondary | ICD-10-CM | POA: Diagnosis not present

## 2017-08-05 DIAGNOSIS — R634 Abnormal weight loss: Secondary | ICD-10-CM | POA: Diagnosis not present

## 2017-08-05 DIAGNOSIS — B370 Candidal stomatitis: Secondary | ICD-10-CM | POA: Diagnosis not present

## 2017-08-05 DIAGNOSIS — R4701 Aphasia: Secondary | ICD-10-CM | POA: Diagnosis not present

## 2017-08-05 DIAGNOSIS — Z5111 Encounter for antineoplastic chemotherapy: Secondary | ICD-10-CM | POA: Diagnosis not present

## 2017-08-05 DIAGNOSIS — Z79899 Other long term (current) drug therapy: Secondary | ICD-10-CM | POA: Diagnosis not present

## 2017-08-05 DIAGNOSIS — C770 Secondary and unspecified malignant neoplasm of lymph nodes of head, face and neck: Secondary | ICD-10-CM | POA: Diagnosis not present

## 2017-08-05 DIAGNOSIS — D696 Thrombocytopenia, unspecified: Secondary | ICD-10-CM | POA: Diagnosis not present

## 2017-08-05 DIAGNOSIS — G4700 Insomnia, unspecified: Secondary | ICD-10-CM | POA: Diagnosis not present

## 2017-08-05 DIAGNOSIS — R040 Epistaxis: Secondary | ICD-10-CM | POA: Diagnosis not present

## 2017-08-05 MED ORDER — SODIUM CHLORIDE 0.9 % IV SOLN
Freq: Once | INTRAVENOUS | Status: AC
  Administered 2017-08-05: 09:00:00 via INTRAVENOUS
  Filled 2017-08-05: qty 1000

## 2017-08-05 MED ORDER — HEPARIN SOD (PORK) LOCK FLUSH 100 UNIT/ML IV SOLN
500.0000 [IU] | Freq: Once | INTRAVENOUS | Status: AC
  Administered 2017-08-04: 500 [IU] via INTRAVENOUS

## 2017-08-05 MED ORDER — HEPARIN SOD (PORK) LOCK FLUSH 100 UNIT/ML IV SOLN
500.0000 [IU] | Freq: Once | INTRAVENOUS | Status: AC
  Administered 2017-08-05: 500 [IU] via INTRAVENOUS

## 2017-08-07 ENCOUNTER — Emergency Department: Payer: BLUE CROSS/BLUE SHIELD

## 2017-08-07 ENCOUNTER — Emergency Department
Admission: EM | Admit: 2017-08-07 | Discharge: 2017-08-08 | Disposition: A | Discharge status: Home / Self Care | Payer: BLUE CROSS/BLUE SHIELD | Attending: Emergency Medicine | Admitting: Emergency Medicine

## 2017-08-07 ENCOUNTER — Other Ambulatory Visit: Payer: Self-pay

## 2017-08-07 ENCOUNTER — Telehealth: Payer: Self-pay | Admitting: Oncology

## 2017-08-07 DIAGNOSIS — J40 Bronchitis, not specified as acute or chronic: Secondary | ICD-10-CM | POA: Diagnosis not present

## 2017-08-07 DIAGNOSIS — Z7982 Long term (current) use of aspirin: Secondary | ICD-10-CM | POA: Diagnosis not present

## 2017-08-07 DIAGNOSIS — Z79899 Other long term (current) drug therapy: Secondary | ICD-10-CM | POA: Diagnosis not present

## 2017-08-07 DIAGNOSIS — Z950 Presence of cardiac pacemaker: Secondary | ICD-10-CM | POA: Diagnosis not present

## 2017-08-07 DIAGNOSIS — E860 Dehydration: Secondary | ICD-10-CM | POA: Insufficient documentation

## 2017-08-07 DIAGNOSIS — R6883 Chills (without fever): Secondary | ICD-10-CM | POA: Diagnosis not present

## 2017-08-07 DIAGNOSIS — R05 Cough: Secondary | ICD-10-CM | POA: Diagnosis not present

## 2017-08-07 DIAGNOSIS — J209 Acute bronchitis, unspecified: Secondary | ICD-10-CM | POA: Insufficient documentation

## 2017-08-07 DIAGNOSIS — R059 Cough, unspecified: Secondary | ICD-10-CM

## 2017-08-07 LAB — COMPREHENSIVE METABOLIC PANEL
ALT: 23 U/L (ref 14–54)
AST: 18 U/L (ref 15–41)
Albumin: 3.4 g/dL — ABNORMAL LOW (ref 3.5–5.0)
Alkaline Phosphatase: 69 U/L (ref 38–126)
Anion gap: 4 — ABNORMAL LOW (ref 5–15)
BUN: 16 mg/dL (ref 6–20)
CHLORIDE: 107 mmol/L (ref 101–111)
CO2: 29 mmol/L (ref 22–32)
Calcium: 8.7 mg/dL — ABNORMAL LOW (ref 8.9–10.3)
Creatinine, Ser: 0.71 mg/dL (ref 0.44–1.00)
GFR calc non Af Amer: >60 mL/min (ref >60)
Glucose, Bld: 96 mg/dL (ref 65–99)
POTASSIUM: 3.6 mmol/L (ref 3.5–5.1)
Sodium: 140 mmol/L (ref 135–145)
Total Bilirubin: 0.9 mg/dL (ref 0.3–1.2)
Total Protein: 5.9 g/dL — ABNORMAL LOW (ref 6.5–8.1)

## 2017-08-07 LAB — CBC WITH DIFFERENTIAL/PLATELET
BASOS ABS: 0 10*3/uL (ref 0–0.1)
Basophils Relative: 0 %
EOS PCT: 0 %
Eosinophils Absolute: 0 10*3/uL (ref 0–0.7)
HCT: 34.3 % — ABNORMAL LOW (ref 35.0–47.0)
Hemoglobin: 11.9 g/dL — ABNORMAL LOW (ref 12.0–16.0)
LYMPHS ABS: 0.3 10*3/uL — AB (ref 1.0–3.6)
LYMPHS PCT: 16 %
MCH: 31.4 pg (ref 26.0–34.0)
MCHC: 34.6 g/dL (ref 32.0–36.0)
MCV: 90.6 fL (ref 80.0–100.0)
MONO ABS: 0.2 10*3/uL (ref 0.2–0.9)
Monocytes Relative: 8 %
Neutro Abs: 1.5 10*3/uL (ref 1.4–6.5)
Neutrophils Relative %: 76 %
PLATELETS: 59 10*3/uL — AB (ref 150–440)
RBC: 3.78 MIL/uL — ABNORMAL LOW (ref 3.80–5.20)
RDW: 13 % (ref 11.5–14.5)
WBC: 2 10*3/uL — ABNORMAL LOW (ref 3.6–11.0)

## 2017-08-07 LAB — LACTIC ACID, PLASMA: Lactic Acid, Venous: 0.7 mmol/L (ref 0.5–1.9)

## 2017-08-07 LAB — INFLUENZA PANEL BY PCR (TYPE A & B)
INFLAPCR: NEGATIVE
INFLBPCR: NEGATIVE

## 2017-08-07 MED ORDER — ACETAMINOPHEN 325 MG PO TABS
650.0000 mg | ORAL_TABLET | Freq: Once | ORAL | Status: AC
  Administered 2017-08-07: 650 mg via ORAL

## 2017-08-07 MED ORDER — ACETAMINOPHEN 325 MG PO TABS
ORAL_TABLET | ORAL | Status: AC
  Administered 2017-08-07: 650 mg via ORAL
  Filled 2017-08-07: qty 2

## 2017-08-07 MED ORDER — SODIUM CHLORIDE 0.9 % IV BOLUS (SEPSIS)
1000.0000 mL | Freq: Once | INTRAVENOUS | Status: AC
  Administered 2017-08-07: 1000 mL via INTRAVENOUS

## 2017-08-07 MED ORDER — PROCHLORPERAZINE MALEATE 10 MG PO TABS
10.0000 mg | ORAL_TABLET | Freq: Once | ORAL | Status: AC
  Administered 2017-08-07: 10 mg via ORAL
  Filled 2017-08-07 (×2): qty 1

## 2017-08-07 NOTE — ED Triage Notes (Signed)
Pt states was sent by her oncologist. Pt states she has cancer, squamous cell and is receiving chemo and radiation. Pt states she has had cough with yellow sputum production, chills and" low grade fever at home up to 99.6" per pt. Pt appears in no acute distress. Pt states she is on levaquin for same.

## 2017-08-07 NOTE — ED Provider Notes (Addendum)
[facility] Emergency Department Provider Note  ____________________________________________  Time seen: Approximately 10:14 PM  I have reviewed the triage vital signs and the nursing notes.   HISTORY  Chief Complaint Cough    HPI Wendy Kelly is a 62 y.o. female sent to the ED for evaluation due to elevated temperature at home. She reports her maximal temperature was 99.6 at home. She has been treated with Levaquin for 5 days so far for a productive cough that was initially with brownish yellow sputum and now seems a more clear yellow sputum but still sick. No frank fever. Positive chills. No significant pain anywhere, no radiation. No shortness of breath. Does have some generalized body aches. Symptoms are mild to moderate, intermittent, no aggravating or alleviating factors.  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 Last chemotherapy was 9 days ago.     Past Medical History:  Diagnosis Date  . AA (aortic aneurysm) ([place])    a. 04/2011 Echo: Ao Root: 4.1cm, Asc Ao 4.7cm.  . Anxiety   . AV block, 2nd degree    a. 05/2005 - s/p MDT Adapta ADDR01 Dual Chamber PPM  . Bicuspid aortic valve   . Cancer ([place])   . Chest pain    a. 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  . Depression   . Expressive aphasia    a. ongoing since 04/2011 - seen by neurology - ? TIA vs. Migraine  . Facial numbness    a. ongoing since 04/2011 - seen by neurology - ? TIA vs. Migraine  . GERD (gastroesophageal reflux disease)   . Low blood pressure   . Presence of permanent cardiac pacemaker   . 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 Syncope    a. 04/2011 Echo: EF 55-65%, No RWMA, Gr 1 DD.     Patient Active Problem List   Diagnosis Date Noted  . 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 Goals of care, counseling/discussion 06/14/2017  . Atrial fibrillation ([place]) 04/19/2017  .  Problems with swallowing and mastication   . Gastritis   . TIA (transient ischemic attack) 05/02/2015  . Atypical chest pain 06/30/2014  . Malaise 09/13/2013  . Ascending aortic aneurysm ([place]) 10/23/2012  . Fatigue 10/23/2012  . Precordial pain 10/23/2012  . Facial numbness   . AV block, 2nd degree   . Expressive aphasia 06/11/2011  . Chest pain 06/11/2011  . Orthostatic hypotension 06/11/2011  . Palpitations 04/01/2011  . ABNORMAL ELECTROCARDIOGRAM 09/15/2009  . SYNCOPE AND COLLAPSE 08/20/2009  . DYSPNEA 08/20/2009  . 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onic 08/20/2009     Past Surgical History:  Procedure Laterality Date  . ABDOMINAL WALL MESH  REMOVAL    . bladder tack    . cataract Bilateral 2004  . CHOLECYSTECTOMY  2000  . DILATION AND CURETTAGE OF UTERUS    . DIRECT LARYNGOSCOPY Right 06/16/2017   Procedure: MICRO DIRECT LARYNGOSCOPY WITH BIOPSY OF RIGHT BASE OF TONGUE;  Surgeon: Carloyn Manner, MD;  Location: [facility] ORS;  Service: ENT;  Laterality: Right;  . EP IMPLANTABLE DEVICE N/A 06/04/2015   Procedure: PPM Generator Changeout;  Surgeon: Deboraha Sprang, MD;  Location: [facility];  Service: Cardiovascular;  Laterality: N/A;  . ESOPHAGOGASTRODUODENOSCOPY (EGD) WITH PROPOFOL N/A 12/18/2015   Procedure: ESOPHAGOGASTRODUODENOSCOPY (EGD) WITH gastric biopsy and dilation;  Surgeon: Lucilla Lame, MD;  Location: [place];  Service: Endoscopy;  Laterality: N/A;  .  EYE SURGERY    . INSERT / REPLACE / REMOVE PACEMAKER    . IR FLUORO GUIDE PORT INSERTION RIGHT  06/22/2017  . PACEMAKER INSERTION  2006   Medtronic Adapta ADDR01  . TONSILLECTOMY    . TONSILLECTOMY    . VAGINAL HYSTERECTOMY  2013   mad/cope     Prior to Admission medications   Medication Sig Start Date End Date Taking? Authorizing Provider  aspirin 81 MG tablet Take 81 mg by mouth daily.    [provider]  b complex vitamins tablet Take 1 tablet by mouth daily.    [provider]  BLACK  ELDERBERRY,BERRY-FLOWER, PO Take 1 capsule by mouth daily.    [provider]  CRANBERRY SOFT PO Take 2 each by mouth daily.     [provider]  dexamethasone (DECADRON) 4 MG tablet Take 2 tablets by mouth once a day on the day after chemotherapy and then take 2 tablets two times a day for 2 days. Take with food. 07/26/17   Sindy Guadeloupe, MD  Digestive Enzymes (DIGESTIVE ENZYME PO) Take 1-2 capsules by mouth daily.    [provider]  fexofenadine (ALLEGRA) 60 MG tablet Take 60 mg by mouth daily as needed.     [provider]  fluconazole (DIFLUCAN) 100 MG tablet Take 1 tablet (100 mg total) by mouth daily. 07/26/17   Chrystal, Eulas Post, MD  fluticasone (FLONASE SENSIMIST) 27.5 MCG/SPRAY nasal spray Place 1 spray into the nose daily as needed.     [provider]  HYDROcodone-acetaminophen (HYCET) 7.5-325 mg/15 ml solution Take 10 mLs by mouth 4 (four) times daily as needed for moderate pain. 06/16/17   Carloyn Manner, MD  ibuprofen (ADVIL,MOTRIN) 200 MG tablet Takes 2-3 tablets as needed.    [provider]  levofloxacin (LEVAQUIN) 500 MG tablet Take 1 tablet (500 mg total) by mouth daily. 08/03/17   Sindy Guadeloupe, MD  lidocaine-prilocaine (EMLA) cream Apply to affected area once 06/16/17   Sindy Guadeloupe, MD  loratadine (CLARITIN) 10 MG tablet Take 10 mg by mouth daily as needed.     [provider]  LORazepam (ATIVAN) 0.5 MG tablet Take 1 tablet (0.5 mg total) by mouth every 6 (six) hours as needed (Nausea or vomiting). 06/17/17   Sindy Guadeloupe, MD  magic mouthwash SOLN Take 5 mLs by mouth 4 (four) times daily as needed for mouth pain. Swish and swallow 07/17/17   Sindy Guadeloupe, MD  nitroGLYCERIN (NITROSTAT) 0.4 MG SL tablet Place 1 tablet (0.4 mg total) under the tongue every 5 (five) minutes as needed for chest pain. 12/16/16   Minna Merritts, MD  NON FORMULARY Take by mouth daily. Bone Up (calcium)     [provider]   ondansetron (ZOFRAN) 4 MG tablet Take 1 tablet (4 mg total) by mouth every 8 (eight) hours as needed for up to 10 doses for nausea or vomiting. 06/16/17   Vaught, Jeannie Fend, MD  ondansetron (ZOFRAN) 8 MG tablet Take 1 tablet (8 mg total) by mouth 2 (two) times daily as needed. Start on the third day after chemotherapy. 06/16/17   Sindy Guadeloupe, MD  Polyethyl Glycol-Propyl Glycol (SYSTANE) 0.4-0.3 % GEL ophthalmic gel Place 1 application into both eyes daily as needed.    [provider]  PRESCRIPTION MEDICATION Place 1 tablet vaginally 3 (three) times a week. E3 1mg  and Testosterone 1mg  compound    [provider]  prochlorperazine (COMPAZINE) 10 MG  tablet Take 1 tablet (10 mg total) by mouth every 6 (six) hours as needed (Nausea or vomiting). 07/26/17   Sindy Guadeloupe, MD  sucralfate (CARAFATE) 1 g tablet Take 1 tablet (1 g total) by mouth 3 (three) times daily. Dissolve in 2-3 tbsp warm water, swish and swallow. 07/26/17   Noreene Filbert, MD     Allergies Cefdinir; Cephalosporins; Dairy aid [lactase]; Flagyl [metronidazole]; Lactose intolerance (gi); Penicillins; Phenylephrine-guaifenesin; Sulfonamide derivatives; Tape; Dexilant [dexlansoprazole]; Entex lq [phenylephrine-guaifenesin]; Other; Oxycodone; Succinylcholine; Codeine; Guaifenesin & derivatives; Lactobacillus; Naproxen sodium; Pseudoephedrine; Sulfa antibiotics; Tetracyclines & related; and Wheat bran   Family History  Problem Relation Age of Onset  . COPD Mother        alive @ 88  . Atrial fibrillation Mother   . Stroke Father        died @ 38  . Breast cancer Maternal Aunt 70  . COPD Maternal Grandmother   . Ovarian cancer Neg Hx   . Diabetes Neg Hx     Social History Social History   Tobacco Use  . Smoking status: Never Smoker  . Smokeless tobacco: Never Used  . Tobacco comment: tobacco use -no  Substance Use Topics  . Alcohol use: No  . Drug use: No    Review of Systems  Constitutional:    Positive chills , no fever.  ENT:   Positive subacute to chronic throat discomfort secondary to radiation No rhinorrhea. Cardiovascular:   No chest pain or syncope. Respiratory:   No shortness of breath. Positive productive cough. Gastrointestinal:   Negative for abdominal pain, vomiting and diarrhea.  Musculoskeletal:   Negative for focal pain or swelling All other systems reviewed and are negative except as documented above in ROS and HPI.  ____________________________________________   PHYSICAL EXAM:  VITAL SIGNS: ED Triage Vitals  Enc Vitals Group     BP 08/07/17 1940 131/77     Pulse Rate 08/07/17 1940 96     Resp 08/07/17 1940 16     Temp 08/07/17 1940 99.1 F (37.3 C)     Temp Source 08/07/17 1940 Oral     SpO2 08/07/17 1940 97 %     Weight 08/07/17 1941 168 lb (76.2 kg)     Height 08/07/17 1941 5\' 4"  (1.626 m)     Head Circumference --      Peak Flow --      Pain Score 08/07/17 1940 0     Pain Loc --      Pain Edu? --      Excl. in [place]? --     Vital signs reviewed, nursing assessments reviewed.   Constitutional:   Alert and oriented. Well appearing and in no distress. Eyes:   No scleral icterus.  EOMI. No nystagmus. No conjunctival pallor. PERRL. ENT   Head:   Normocephalic and atraumatic.   Nose:   No congestion/rhinnorhea.    Mouth/Throat:   MMM, no pharyngeal erythema. No peritonsillar mass. Epithelial sloughing around the uvula and oropharynx secondary to radiation   Neck:   No meningismus. Full ROM. Hematological/Lymphatic/Immunilogical:   No cervical lymphadenopathy. Cardiovascular:   RRR. Symmetric bilateral radial and DP pulses.  No murmurs.  Respiratory:   Normal respiratory effort without tachypnea/retractions. Breath sounds are clear and equal bilaterally. No wheezes/rales/rhonchi. Gastrointestinal:   Soft and nontender. Non distended. There is no CVA tenderness.  No rebound, rigidity, or guarding. Genitourinary:    deferred Musculoskeletal:   Normal range of motion in all extremities. No joint  effusions.  No lower extremity tenderness.  No edema. Neurologic:   Normal speech and language.  Motor grossly intact. No acute focal neurologic deficits are appreciated.  Skin:    Skin is warm, dry and intact. No rash noted.  No petechiae, purpura, or bullae.  ____________________________________________    LABS (pertinent positives/negatives) (all labs ordered are listed, but only abnormal results are displayed) Labs Reviewed  CBC WITH DIFFERENTIAL/PLATELET - Abnormal; Notable for the following components:      Result Value   WBC 2.0 (*)    RBC 3.78 (*)    Hemoglobin 11.9 (*)    HCT 34.3 (*)    Platelets 59 (*)    Lymphs Abs 0.3 (*)    All other components within normal limits  COMPREHENSIVE METABOLIC PANEL - Abnormal; Notable for the following components:   Calcium 8.7 (*)    Total Protein 5.9 (*)    Albumin 3.4 (*)    Anion gap 4 (*)    All other components within normal limits  INFLUENZA PANEL BY PCR (TYPE A & B)  LACTIC ACID, PLASMA  LACTIC ACID, PLASMA   ____________________________________________   EKG    ____________________________________________    RADIOLOGY  Dg Chest 2 View  Result Date: 08/07/2017 CLINICAL DATA:  Productive cough and fever. Oropharyngeal squamous 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 therapy. EXAM: CHEST  2 VIEW COMPARISON:  None. FINDINGS: The heart size and mediastinal contours are within normal limits. 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 Right-sided Port-A-Cath also in expected position. No pneumothorax identified. Both lungs are clear. The visualized skeletal structures are unremarkable. IMPRESSION: No active cardiopulmonary disease. Electronically Signed   By: Earle Gell M.D.   On: 08/07/2017 20:58     ____________________________________________   PROCEDURES Procedures  ____________________________________________    CLINICAL IMPRESSION / ASSESSMENT AND PLAN / ED COURSE  Pertinent labs & imaging results that were available during my care of the patient were reviewed by me and considered in my medical decision making (see chart for details).   Patient well-appearing no acute distress, sent to the ED for evaluation due to elevated temperature at home, highest of about 99.5. She has been treated with 5 days of Levaquin so far for productive cough and by the patient's account this is improving. Workup today unremarkable with electrolytes renal function stable and normal. Lactate normal. White blood cell count 2.0 with no neutropenia, expected given last chemotherapy was 9 days ago. No evidence of opportunistic infection, no evidence of epiglottitis or diphtheria. Patient's been given IV fluids for hydration. She is tolerating oral intake. We'll discharge home to follow up with oncology tomorrow.      ____________________________________________   FINAL CLINICAL IMPRESSION(S) / ED DIAGNOSES    Final diagnoses:  Cough  Bronchitis  Dehydration       Portions of this note were generated with dragon dictation software. Dictation errors may occur despite best attempts at proofreading.    Carrie Mew, MD 08/07/17 Otila Back    Carrie Mew, MD 08/07/17 [id number]

## 2017-08-07 NOTE — ED Notes (Signed)
Patient asked to only have gauze and paper tape instead of Tegaderm d/t severe allergy to adhesive.

## 2017-08-07 NOTE — ED Notes (Signed)
Patient transported to X-ray 

## 2017-08-07 NOTE — Telephone Encounter (Signed)
Patient called to report feeling generalized weakness, "gagging all the time despite taking nausea medication", profound sleepiness, and not able to eat for the past week. She has been getting IV fluids during the week along with her concurrent chemotherapy and radiation.  I advise patient to go to ER to be further evaluated. She maybe dehydrated and need IV fluid. She voices understanding and her husband will drive her to ER.  I have called ER and talked to triage RN [facility]

## 2017-08-07 NOTE — Discharge Instructions (Signed)
Your chest x-ray and lab tests today were unremarkable. Continue drinking fluids and staying well-hydrated, and follow up with your oncologist tomorrow for continued monitoring of your symptoms.

## 2017-08-08 ENCOUNTER — Inpatient Hospital Stay: Payer: BLUE CROSS/BLUE SHIELD

## 2017-08-08 ENCOUNTER — Telehealth: Payer: Self-pay | Admitting: *Deleted

## 2017-08-08 ENCOUNTER — Ambulatory Visit: Admission: RE | Admit: 2017-08-08 | Payer: BLUE CROSS/BLUE SHIELD | Source: Ambulatory Visit

## 2017-08-08 NOTE — Telephone Encounter (Signed)
Called pt and left message that we are changing her appt because of low plt. New appt is 1/31 for labs and see md and treatment Friday if labs ok. Left date and time on voicemail

## 2017-08-09 ENCOUNTER — Ambulatory Visit: Payer: BLUE CROSS/BLUE SHIELD

## 2017-08-10 ENCOUNTER — Other Ambulatory Visit: Payer: Self-pay | Admitting: *Deleted

## 2017-08-10 ENCOUNTER — Ambulatory Visit: Payer: BLUE CROSS/BLUE SHIELD

## 2017-08-10 DIAGNOSIS — C109 Malignant neoplasm of oropharynx, unspecified: Secondary | ICD-10-CM

## 2017-08-11 ENCOUNTER — Inpatient Hospital Stay (HOSPITAL_BASED_OUTPATIENT_CLINIC_OR_DEPARTMENT_OTHER): Payer: BLUE CROSS/BLUE SHIELD | Admitting: Oncology

## 2017-08-11 ENCOUNTER — Inpatient Hospital Stay: Payer: BLUE CROSS/BLUE SHIELD

## 2017-08-11 ENCOUNTER — Other Ambulatory Visit: Payer: Self-pay | Admitting: *Deleted

## 2017-08-11 ENCOUNTER — Ambulatory Visit: Admission: RE | Admit: 2017-08-11 | Payer: BLUE CROSS/BLUE SHIELD | Source: Ambulatory Visit

## 2017-08-11 ENCOUNTER — Encounter: Payer: Self-pay | Admitting: Oncology

## 2017-08-11 VITALS — BP 111/79 | HR 90 | Temp 98.2°F | Resp 16 | Wt 165.0 lb

## 2017-08-11 DIAGNOSIS — C770 Secondary and unspecified malignant neoplasm of lymph nodes of head, face and neck: Secondary | ICD-10-CM | POA: Diagnosis not present

## 2017-08-11 DIAGNOSIS — F419 Anxiety disorder, unspecified: Secondary | ICD-10-CM | POA: Diagnosis not present

## 2017-08-11 DIAGNOSIS — R05 Cough: Secondary | ICD-10-CM

## 2017-08-11 DIAGNOSIS — F329 Major depressive disorder, single episode, unspecified: Secondary | ICD-10-CM

## 2017-08-11 DIAGNOSIS — Q231 Congenital insufficiency of aortic valve: Secondary | ICD-10-CM

## 2017-08-11 DIAGNOSIS — D6959 Other secondary thrombocytopenia: Secondary | ICD-10-CM

## 2017-08-11 DIAGNOSIS — E860 Dehydration: Secondary | ICD-10-CM | POA: Insufficient documentation

## 2017-08-11 DIAGNOSIS — Z7982 Long term (current) use of aspirin: Secondary | ICD-10-CM

## 2017-08-11 DIAGNOSIS — J019 Acute sinusitis, unspecified: Secondary | ICD-10-CM

## 2017-08-11 DIAGNOSIS — R4701 Aphasia: Secondary | ICD-10-CM

## 2017-08-11 DIAGNOSIS — Z95828 Presence of other vascular implants and grafts: Secondary | ICD-10-CM

## 2017-08-11 DIAGNOSIS — B370 Candidal stomatitis: Secondary | ICD-10-CM | POA: Diagnosis not present

## 2017-08-11 DIAGNOSIS — K219 Gastro-esophageal reflux disease without esophagitis: Secondary | ICD-10-CM

## 2017-08-11 DIAGNOSIS — C109 Malignant neoplasm of oropharynx, unspecified: Secondary | ICD-10-CM

## 2017-08-11 DIAGNOSIS — T451X5A Adverse effect of antineoplastic and immunosuppressive drugs, initial encounter: Secondary | ICD-10-CM

## 2017-08-11 DIAGNOSIS — R200 Anesthesia of skin: Secondary | ICD-10-CM

## 2017-08-11 DIAGNOSIS — Z5111 Encounter for antineoplastic chemotherapy: Secondary | ICD-10-CM

## 2017-08-11 DIAGNOSIS — D696 Thrombocytopenia, unspecified: Secondary | ICD-10-CM

## 2017-08-11 DIAGNOSIS — R059 Cough, unspecified: Secondary | ICD-10-CM

## 2017-08-11 DIAGNOSIS — Z79899 Other long term (current) drug therapy: Secondary | ICD-10-CM | POA: Diagnosis not present

## 2017-08-11 DIAGNOSIS — Z950 Presence of cardiac pacemaker: Secondary | ICD-10-CM

## 2017-08-11 DIAGNOSIS — R5383 Other fatigue: Secondary | ICD-10-CM | POA: Diagnosis not present

## 2017-08-11 DIAGNOSIS — R531 Weakness: Secondary | ICD-10-CM

## 2017-08-11 DIAGNOSIS — G4700 Insomnia, unspecified: Secondary | ICD-10-CM

## 2017-08-11 DIAGNOSIS — R040 Epistaxis: Secondary | ICD-10-CM

## 2017-08-11 DIAGNOSIS — D701 Agranulocytosis secondary to cancer chemotherapy: Secondary | ICD-10-CM

## 2017-08-11 DIAGNOSIS — R634 Abnormal weight loss: Secondary | ICD-10-CM

## 2017-08-11 HISTORY — DX: Dehydration: E86.0

## 2017-08-11 LAB — COMPREHENSIVE METABOLIC PANEL
ALT: 21 U/L (ref 14–54)
AST: 20 U/L (ref 15–41)
Albumin: 3.4 g/dL — ABNORMAL LOW (ref 3.5–5.0)
Alkaline Phosphatase: 67 U/L (ref 38–126)
Anion gap: 5 (ref 5–15)
BILIRUBIN TOTAL: 0.9 mg/dL (ref 0.3–1.2)
BUN: 13 mg/dL (ref 6–20)
CHLORIDE: 104 mmol/L (ref 101–111)
CO2: 30 mmol/L (ref 22–32)
Calcium: 8.7 mg/dL — ABNORMAL LOW (ref 8.9–10.3)
Creatinine, Ser: 0.74 mg/dL (ref 0.44–1.00)
Glucose, Bld: 96 mg/dL (ref 65–99)
Potassium: 3.9 mmol/L (ref 3.5–5.1)
Sodium: 139 mmol/L (ref 135–145)
TOTAL PROTEIN: 5.9 g/dL — AB (ref 6.5–8.1)

## 2017-08-11 LAB — MAGNESIUM: MAGNESIUM: 2.2 mg/dL (ref 1.7–2.4)

## 2017-08-11 LAB — CBC WITH DIFFERENTIAL/PLATELET
BASOS ABS: 0 10*3/uL (ref 0–0.1)
BASOS PCT: 1 %
EOS ABS: 0 10*3/uL (ref 0–0.7)
EOS PCT: 0 %
HCT: 30.9 % — ABNORMAL LOW (ref 35.0–47.0)
Hemoglobin: 10.6 g/dL — ABNORMAL LOW (ref 12.0–16.0)
LYMPHS PCT: 22 %
Lymphs Abs: 0.3 10*3/uL — ABNORMAL LOW (ref 1.0–3.6)
MCH: 31.2 pg (ref 26.0–34.0)
MCHC: 34.3 g/dL (ref 32.0–36.0)
MCV: 90.9 fL (ref 80.0–100.0)
Monocytes Absolute: 0.1 10*3/uL — ABNORMAL LOW (ref 0.2–0.9)
Monocytes Relative: 10 %
Neutro Abs: 1 10*3/uL — ABNORMAL LOW (ref 1.4–6.5)
Neutrophils Relative %: 67 %
PLATELETS: 64 10*3/uL — AB (ref 150–440)
RBC: 3.4 MIL/uL — AB (ref 3.80–5.20)
RDW: 13.2 % (ref 11.5–14.5)
WBC: 1.5 10*3/uL — AB (ref 3.6–11.0)

## 2017-08-11 MED ORDER — HEPARIN SOD (PORK) LOCK FLUSH 100 UNIT/ML IV SOLN
500.0000 [IU] | Freq: Once | INTRAVENOUS | Status: AC
  Administered 2017-08-11: 500 [IU] via INTRAVENOUS
  Filled 2017-08-11: qty 5

## 2017-08-11 MED ORDER — FLUCONAZOLE 100 MG PO TABS: 100.0000 mg | ORAL_TABLET | Freq: Every day | ORAL | 0 refills | Status: DC

## 2017-08-11 MED ORDER — SODIUM CHLORIDE 0.9% FLUSH
10.0000 mL | INTRAVENOUS | Status: DC | PRN
  Administered 2017-08-11: 10 mL via INTRAVENOUS
  Filled 2017-08-11: qty 10

## 2017-08-11 MED ORDER — SODIUM CHLORIDE 0.9 % IV SOLN
Freq: Once | INTRAVENOUS | Status: AC
  Filled 2017-08-11: qty 1000

## 2017-08-11 MED ORDER — TBO-FILGRASTIM 480 MCG/0.8ML ~~LOC~~ SOSY
480.0000 ug | PREFILLED_SYRINGE | Freq: Once | SUBCUTANEOUS | Status: AC
  Administered 2017-08-11: 480 ug via SUBCUTANEOUS
  Filled 2017-08-11: qty 0.8

## 2017-08-11 MED ORDER — SODIUM CHLORIDE 0.9 % IV SOLN: 10.0000 mg | Freq: Once | INTRAVENOUS | Status: DC

## 2017-08-11 MED ORDER — DEXAMETHASONE SODIUM PHOSPHATE 10 MG/ML IJ SOLN
10.0000 mg | Freq: Once | INTRAMUSCULAR | Status: AC
  Administered 2017-08-11: 10 mg via INTRAVENOUS
  Filled 2017-08-11: qty 1

## 2017-08-11 MED ORDER — SODIUM CHLORIDE 0.9 % IV SOLN
Freq: Once | INTRAVENOUS | Status: AC
  Administered 2017-08-11: 12:00:00 via INTRAVENOUS
  Filled 2017-08-11: qty 1000

## 2017-08-11 NOTE — Progress Notes (Signed)
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 Consuela Mimes evaluation of right neck mass. The mass has been present for or a year and has been growing gradually. It was associated with some right ear pain but no fever. Patient has been a never smoker.Ultrasound on 05/30/2017 showed:Single enlarged lymph node in the right neck measures approximately 4.8 x 1.7 x 3.0 cm  2.Patient underwent IR guided lymph node biopsy of the cervical lymph node which showed metastatic squamous cell carcinoma. Staining for P 16 was positive and sox 10 was negative   3.PET/CT scan on 06/09/2017 showed intensely hypermetabolic enlarged right level 2 cervical lymph node with an SUV of 11.1. No clear site of primary malignancy within the head and neck. Potential site of investigation would include right base of tongue. There was asymmetric metabolic activity noted in that area with an SUV of 4.5. Less suspicious region in the posterior glottis. No evidence of distant metastatic disease.  4.Patient works as a Geophysicist/field seismologist at [employer]. Patient has had multiple allergies to medications in the past. Reports that when she took steroids in the past it made her quite aggravated. Her sister was hospitalized in a mental health facility after receiving a steroid course in the past.  5. Right tongue base biopsy positive for SCC. Plan is to proceed with  concurrent chemo/RT with cisplatin/RT. Baseline audiogram normal   Interval history- 1. Cough continues and is on and off mostly clear but sometimes greenish. No fever or sob 2. She reports feeling fatigued. It is hard for her to swallow and food does not taste good. She has been unable to keep up with po intake and is losing weight. She denies any pain and has not been using any opioids  ECOG PS- 1 Pain scale- 0   Review of systems- Review of Systems  Constitutional: Positive for malaise/fatigue. Negative for chills, fever and weight loss.  HENT: Negative for congestion, ear discharge and nosebleeds.        Lack of taste sensation  Eyes: Negative for blurred vision.  Respiratory: Positive for cough. Negative for hemoptysis, sputum production, shortness of breath and wheezing.   Cardiovascular: Negative for chest pain, palpitations, orthopnea and claudication.  Gastrointestinal: Negative for abdominal pain, blood in stool, constipation, diarrhea, heartburn, melena, nausea and vomiting.  Genitourinary: Negative for dysuria, flank pain, frequency, hematuria and urgency.  Musculoskeletal: Negative for back pain, joint pain and myalgias.  Skin: Negative for rash.  Neurological: Negative for dizziness, tingling, focal weakness, seizures, weakness and headaches.  Endo/Heme/Allergies: Does not bruise/bleed easily.  Psychiatric/Behavioral: Negative for depression and suicidal ideas. The patient does not have insomnia.      Allergies  Allergen Reactions  . Cefdinir Swelling    Swelling in mouth   . Cephalosporins Swelling  . Dairy Aid [Lactase] Swelling    Milk and milk products cause swelling and tingling of tongue  . Flagyl [Metronidazole] Swelling    Mouth and throat and ears go red and burn  .  Lactose Intolerance (Gi) Other (See Comments)    GI upset.  Eats only gluten free  . Penicillins Rash    Rash/hives  . Phenylephrine-Guaifenesin Anaphylaxis    Night terrors  . Sulfonamide  Derivatives Rash    Rash/itching.  From head to her toes, the itching was horrible. Penicillin is the worst  . Tape Rash    Adhesives all cause a problem severely.  It doesn't matter if it is paper tape or not.  Marland Kitchen Dexilant [Dexlansoprazole]     Pressure in head like head is in a vice and being squeezed  . Entex Lq [Phenylephrine-Guaifenesin]     Night terrors  . Other Other (See Comments)    Flu vaccine:  Weakness, dizziness, tia type symptoms.    . Oxycodone Nausea And Vomiting    And itching  . Succinylcholine Other (See Comments)    Trouble waking up  . Codeine Nausea And Vomiting  . Guaifenesin & Derivatives Other (See Comments)    Night terrors  . Lactobacillus Itching  . Naproxen Sodium     Itching/rash  . Pseudoephedrine Rash  . Sulfa Antibiotics Rash  . Tetracyclines & Related Other (See Comments)    Was taking flagyl at the same time; unsure which caused the swelling  Swelling of throat. Pharmacist states that he believes it was the flagyl and not the tetracycline  . Wheat Bran Other (See Comments)    Upset stomach. Now eats gluten free     Past Medical History:  Diagnosis Date  . AA (aortic aneurysm) ([place])    a. 04/2011 Echo: Ao Root: 4.1cm, Asc Ao 4.7cm.  . Anxiety   . AV block, 2nd degree    a. 05/2005 - s/p MDT Adapta ADDR01 Dual Chamber PPM  . Bicuspid aortic valve   . Cancer ([place])   . Chest pain    a. 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  . Depression   . Expressive aphasia    a. ongoing since 04/2011 - seen by neurology - ? TIA vs. Migraine  . Facial numbness    a. ongoing since 04/2011 - seen by neurology - ? TIA vs. Migraine  . GERD (gastroesophageal reflux disease)   . Low blood pressure   . Presence of permanent cardiac pacemaker   . 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 Syncope    a. 04/2011 Echo: EF 55-65%, No RWMA, Gr 1 DD.     Past Surgical History:  Procedure Laterality Date  . ABDOMINAL WALL MESH  REMOVAL    .  bladder tack    . cataract Bilateral 2004  . CHOLECYSTECTOMY  2000  . DILATION AND CURETTAGE OF UTERUS    . DIRECT LARYNGOSCOPY Right 06/16/2017   Procedure: MICRO DIRECT LARYNGOSCOPY WITH BIOPSY OF RIGHT BASE OF TONGUE;  Surgeon: Carloyn Manner, MD;  Location: [facility];  Service: ENT;  Laterality: Right;  . EP IMPLANTABLE DEVICE N/A 06/04/2015   Procedure: PPM Generator Changeout;  Surgeon: Deboraha Sprang, MD;  Location: [facility];  Service: Cardiovascular;  Laterality: N/A;  . ESOPHAGOGASTRODUODENOSCOPY (EGD) WITH PROPOFOL N/A 12/18/2015   Procedure: ESOPHAGOGASTRODUODENOSCOPY (EGD) WITH gastric biopsy and dilation;  Surgeon: Lucilla Lame, MD;  Location: [facility];  Service: Endoscopy;  Laterality: N/A;  . EYE SURGERY    . INSERT / REPLACE / REMOVE PACEMAKER    . IR FLUORO GUIDE PORT INSERTION RIGHT  06/22/2017  . PACEMAKER INSERTION  2006   [employer]onic Adapta  ADDR01  . TONSILLECTOMY    . TONSILLECTOMY    . VAGINAL HYSTERECTOMY  2013   mad/cope    Social History   Socioeconomic History  . Marital status: Married    Spouse name: Not on file  . Number of children: Not on file  . Years of education: Not on file  . Highest education level: Not on file  Social Needs  . Financial resource strain: Not on file  . Food insecurity - worry: Not on file  . Food insecurity - inability: Not on file  . Transportation needs - medical: Not on file  . Transportation needs - non-medical: Not on file  Occupational History  . Not on file  Tobacco Use  . Smoking status: Never Smoker  . Smokeless tobacco: Never Used  . Tobacco comment: tobacco use -no  Substance and Sexual Activity  . Alcohol use: No  . Drug use: No  . Sexual activity: Yes    Birth control/protection: Surgical  Other Topics Concern  . Not on file  Social History Narrative   Lives in [place] with husband.  Works out regularly.  Works as a Geophysicist/field seismologist - owns own business.     Family History    Problem Relation Age of Onset  . COPD Mother        alive @ 36  . Atrial fibrillation Mother   . Stroke Father        died @ 84  . Breast cancer Maternal Aunt 70  . COPD Maternal Grandmother   . Ovarian cancer Neg Hx   . Diabetes Neg Hx      Current Outpatient Medications:  .  aspirin 81 MG tablet, Take 81 mg by mouth daily., Disp: , Rfl:  .  b complex vitamins tablet, Take 1 tablet by mouth daily., Disp: , Rfl:  .  BLACK ELDERBERRY,BERRY-FLOWER, PO, Take 1 capsule by mouth daily., Disp: , Rfl:  .  CRANBERRY SOFT PO, Take 2 each by mouth daily. , Disp: , Rfl:  .  dexamethasone (DECADRON) 4 MG tablet, Take 2 tablets by mouth once a day on the day after chemotherapy and then take 2 tablets two times a day for 2 days. Take with food., Disp: 30 tablet, Rfl: 1 .  Digestive Enzymes (DIGESTIVE ENZYME PO), Take 1-2 capsules by mouth daily., Disp: , Rfl:  .  fexofenadine (ALLEGRA) 60 MG tablet, Take 60 mg by mouth daily as needed. , Disp: , Rfl:  .  fluticasone (FLONASE SENSIMIST) 27.5 MCG/SPRAY nasal spray, Place 1 spray into the nose daily as needed. , Disp: , Rfl:  .  lidocaine-prilocaine (EMLA) cream, Apply to affected area once, Disp: 30 g, Rfl: 3 .  loratadine (CLARITIN) 10 MG tablet, Take 10 mg by mouth daily as needed. , Disp: , Rfl:  .  LORazepam (ATIVAN) 0.5 MG tablet, Take 1 tablet (0.5 mg total) by mouth every 6 (six) hours as needed (Nausea or vomiting)., Disp: 30 tablet, Rfl: 0 .  magic mouthwash SOLN, Take 5 mLs by mouth 4 (four) times daily as needed for mouth pain. Swish and swallow, Disp: 420 mL, Rfl: 1 .  nitroGLYCERIN (NITROSTAT) 0.4 MG SL tablet, Place 1 tablet (0.4 mg total) under the tongue every 5 (five) minutes as needed for chest pain., Disp: 25 tablet, Rfl: 3 .  NON FORMULARY, Take by mouth daily. Bone Up (calcium) , Disp: , Rfl:  .  ondansetron (ZOFRAN) 4 MG tablet, Take 1 tablet (4 mg total)  by mouth every 8 (eight) hours as needed for up to 10 doses for nausea or  vomiting., Disp: 20 tablet, Rfl: 0 .  ondansetron (ZOFRAN) 8 MG tablet, Take 1 tablet (8 mg total) by mouth 2 (two) times daily as needed. Start on the third day after chemotherapy., Disp: 30 tablet, Rfl: 1 .  Polyethyl Glycol-Propyl Glycol (SYSTANE) 0.4-0.3 % GEL ophthalmic gel, Place 1 application into both eyes daily as needed., Disp: , Rfl:  .  PRESCRIPTION MEDICATION, Place 1 tablet vaginally 3 (three) times a week. E3 1mg  and Testosterone 1mg  compound, Disp: , Rfl:  .  prochlorperazine (COMPAZINE) 10 MG tablet, Take 1 tablet (10 mg total) by mouth every 6 (six) hours as needed (Nausea or vomiting)., Disp: 30 tablet, Rfl: 1 .  sucralfate (CARAFATE) 1 g tablet, Take 1 tablet (1 g total) by mouth 3 (three) times daily. Dissolve in 2-3 tbsp warm water, swish and swallow., Disp: 90 tablet, Rfl: 3 .  fluconazole (DIFLUCAN) 100 MG tablet, Take 1 tablet (100 mg total) by mouth daily. Take 2 tablets day 1, then 1 tablet daily til complete, Disp: 9 tablet, Rfl: 0 .  HYDROcodone-acetaminophen (HYCET) 7.5-325 mg/15 ml solution, Take 10 mLs by mouth 4 (four) times daily as needed for moderate pain. (Patient not taking: Reported on 08/11/2017), Disp: 300 mL, Rfl: 0 No current facility-administered medications for this visit.   Facility-Administered Medications Ordered in Other Visits:  .  heparin lock flush 100 unit/mL, 500 Units, Intravenous, Once, Randa Evens C, MD .  sodium chloride flush (NS) 0.9 % injection 10 mL, 10 mL, Intravenous, PRN, Sindy Guadeloupe, MD, 10 mL at 08/11/17 1200  Physical exam:  Vitals:   08/11/17 1118  BP: 111/79  Pulse: 90  Resp: 16  Temp: 98.2 F (36.8 C)  TempSrc: Tympanic  Weight: 165 lb (74.8 kg)   Physical Exam  Constitutional: She is oriented to person, place, and time.  Appears fatigued.   HENT:  Head: Normocephalic and atraumatic.  Thrush noted on tongue and mucous membranes  Eyes: EOM are normal. Pupils are equal, round, and reactive to light.  Neck: Normal  range of motion.  Cardiovascular: Normal rate, regular rhythm and normal heart sounds.  Pulmonary/Chest: Effort normal and breath sounds normal.  Abdominal: Soft. Bowel sounds are normal.  Lymphadenopathy:  Right cervical adenopathy is barely palpable  Neurological: She is alert and oriented to person, place, and time.  Skin: Skin is warm and dry.     CMP Latest Ref Rng & Units 08/11/2017  Glucose 65 - 99 mg/dL 96  BUN 6 - 20 mg/dL 13  Creatinine 0.44 - 1.00 mg/dL 0.74  Sodium 135 - 145 mmol/L 139  Potassium 3.5 - 5.1 mmol/L 3.9  Chloride 101 - 111 mmol/L 104  CO2 22 - 32 mmol/L 30  Calcium 8.9 - 10.3 mg/dL 8.7(L)  Total Protein 6.5 - 8.1 g/dL 5.9(L)  Total Bilirubin 0.3 - 1.2 mg/dL 0.9  Alkaline Phos 38 - 126 U/L 67  AST 15 - 41 U/L 20  ALT 14 - 54 U/L 21   CBC Latest Ref Rng & Units 08/11/2017  WBC 3.6 - 11.0 K/uL 1.5(L)  Hemoglobin 12.0 - 16.0 g/dL 10.6(L)  Hematocrit 35.0 - 47.0 % 30.9(L)  Platelets 150 - 440 K/uL 64(L)    No images are attached to the encounter.  Dg Chest 2 View  Result Date: 08/07/2017 CLINICAL DATA:  Productive cough and fever. Oropharyngeal squamous cell carcinoma. Currently undergoing chemotherapy and  radiation therapy. EXAM: CHEST  2 VIEW COMPARISON:  None. FINDINGS: The heart size and mediastinal contours are within normal limits. 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 Right-sided Port-A-Cath also in expected position. No pneumothorax identified. Both lungs are clear. The visualized skeletal structures are unremarkable. IMPRESSION: No active cardiopulmonary disease. Electronically Signed   By: Earle Gell M.D.   On: 08/07/2017 20:58     Assessment and plan- Patient is a 62 y.o. 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  1. Radiation is currently on hold due to cytopenias and ongoing symptoms. Last dose of weekly cisplatin #3 was on 07/19/17. She  continues to have neutropenia and thrombocytopenia. Chemo will be on hold this week. rtc in 1 week- cbc/ cmp for possible dose 4 of cisplatin dose reduced to 30 mg/meter square if counts improve  2. Chemo induced neutropenia: neupogen today and tomorrow. Repeat cbc on 2/4. If anc <1.5 she will get neupogen  3. Chemo induced thrombocytopenia- no need for platelet transfusion. Chemo on hold. Continue to monitor  4. Oral thrush- will prescribe diflucan 200 mg day 1 followed by 100 mg for 7 days  5. Cough likely viral URI. She has completed 7 days of levaquin. CXR shows no pneumonia. Continue to monitor  6. Dehydration- 1L IVF with 10 mg IV decadron today and on 2/4. She will let us know if she feels dehydrated tomorrow as well and we may give her fluids tomorrow  7. Weight loss- [name] has lost 7 pounds in 2 weeks. Hopefully it stabilizes after break from chemo/RT and treatment of thrush. Appreciate nutrition input     Visit Diagnosis 1. Squamous cell carcinoma of oropharynx (HCC)   2. Dehydration   3. Chemotherapy induced neutropenia (HCC)   4. Chemotherapy-induced thrombocytopenia   5. Cough   6. Oral thrush      Dr. Randa Evens, MD, MPH [facility] Pager- [phone] 08/11/2017 1:24 PM

## 2017-08-11 NOTE — Progress Notes (Signed)
Nutrition Follow-up:  Patient seen during IV fluid infusion today.  Patient not feeling well, currently has thrush.  Noted ED visit for fluids.  Continues with thick saliva, burning mouth (observed drinking lemon lime soda).  Reports that she ate few eggs, sausage and grits this am. Reports last night for dinner had 1/4 of [employer] baked potato, and 1/2 of small chili (couldn't eat more due to burning).  Yesterday for lunch drank [employer] shake (325 kcals, 16 g protein) and for breakfast ate 1/2 pack oatmeal and 1 egg with vegan cheese.  Has been able to eat some icy pops but they burn mouth and few tbsp of almond milk ice cream.  Reports over the past week has not felt like eating, "I just didn't care if I ate because a I just felt so bad." Also reports gagging and vomiting at times over the past week.    Radiation and chemotherapy currently on hold.   Medications: reviewed  Labs: reviewed  Anthropometrics:   Weight decreased to 165 lb today from 168 lb on 1/24.  Initial assessment wt of 177 lb on 12/11.   7% weight loss in the last 1 1/2 months, significant   NUTRITION DIAGNOSIS: Predicted suboptimal energy intake continues    MALNUTRITION DIAGNOSIS: Patient meets criteria for severe malnutrition in acute illness as evidenced by 7% weight loss in 1 1/2 months and eating < 50% of energy needs for > or equal to 5 days.    INTERVENTION:   Patient could benefit from PEG tube placement secondary to not meeting nutritional needs, significant weight loss, treatment side effects and with more treatments planned. MD notified of recommendations Discussed with patient 3 cartons of [employer] shake will meet about half of calorie needs.  Encouraged her to start drinking these on regular basis for added nutrition.  6 cartons of standard 1.0 shake will meet nutritional needs if unable to eat anything.  Patient verbalized understanding.     MONITORING, EVALUATION, GOAL: weight trends,  intake   NEXT VISIT: phone call Feb 7  Syrena Burges B. [name], [place], [place] Registered Dietitian [phone] (pager)

## 2017-08-12 ENCOUNTER — Inpatient Hospital Stay (HOSPITAL_BASED_OUTPATIENT_CLINIC_OR_DEPARTMENT_OTHER): Payer: BLUE CROSS/BLUE SHIELD | Admitting: Nurse Practitioner

## 2017-08-12 ENCOUNTER — Inpatient Hospital Stay: Payer: BLUE CROSS/BLUE SHIELD | Attending: Nurse Practitioner

## 2017-08-12 ENCOUNTER — Ambulatory Visit: Payer: BLUE CROSS/BLUE SHIELD

## 2017-08-12 DIAGNOSIS — Z803 Family history of malignant neoplasm of breast: Secondary | ICD-10-CM | POA: Insufficient documentation

## 2017-08-12 DIAGNOSIS — Z79899 Other long term (current) drug therapy: Secondary | ICD-10-CM

## 2017-08-12 DIAGNOSIS — E860 Dehydration: Secondary | ICD-10-CM

## 2017-08-12 DIAGNOSIS — Z5111 Encounter for antineoplastic chemotherapy: Secondary | ICD-10-CM | POA: Diagnosis not present

## 2017-08-12 DIAGNOSIS — K219 Gastro-esophageal reflux disease without esophagitis: Secondary | ICD-10-CM | POA: Insufficient documentation

## 2017-08-12 DIAGNOSIS — R634 Abnormal weight loss: Secondary | ICD-10-CM | POA: Insufficient documentation

## 2017-08-12 DIAGNOSIS — R4701 Aphasia: Secondary | ICD-10-CM | POA: Diagnosis not present

## 2017-08-12 DIAGNOSIS — D696 Thrombocytopenia, unspecified: Secondary | ICD-10-CM

## 2017-08-12 DIAGNOSIS — R630 Anorexia: Secondary | ICD-10-CM | POA: Insufficient documentation

## 2017-08-12 DIAGNOSIS — B379 Candidiasis, unspecified: Secondary | ICD-10-CM | POA: Insufficient documentation

## 2017-08-12 DIAGNOSIS — M79671 Pain in right foot: Secondary | ICD-10-CM | POA: Diagnosis not present

## 2017-08-12 DIAGNOSIS — R0981 Nasal congestion: Secondary | ICD-10-CM | POA: Diagnosis not present

## 2017-08-12 DIAGNOSIS — Z9049 Acquired absence of other specified parts of digestive tract: Secondary | ICD-10-CM | POA: Insufficient documentation

## 2017-08-12 DIAGNOSIS — R600 Localized edema: Secondary | ICD-10-CM

## 2017-08-12 DIAGNOSIS — R5383 Other fatigue: Secondary | ICD-10-CM

## 2017-08-12 DIAGNOSIS — I714 Abdominal aortic aneurysm, without rupture: Secondary | ICD-10-CM | POA: Diagnosis not present

## 2017-08-12 DIAGNOSIS — R609 Edema, unspecified: Secondary | ICD-10-CM | POA: Insufficient documentation

## 2017-08-12 DIAGNOSIS — Z7982 Long term (current) use of aspirin: Secondary | ICD-10-CM

## 2017-08-12 DIAGNOSIS — R079 Chest pain, unspecified: Secondary | ICD-10-CM

## 2017-08-12 DIAGNOSIS — I951 Orthostatic hypotension: Secondary | ICD-10-CM | POA: Insufficient documentation

## 2017-08-12 DIAGNOSIS — R110 Nausea: Secondary | ICD-10-CM | POA: Insufficient documentation

## 2017-08-12 DIAGNOSIS — H9201 Otalgia, right ear: Secondary | ICD-10-CM

## 2017-08-12 DIAGNOSIS — C109 Malignant neoplasm of oropharynx, unspecified: Secondary | ICD-10-CM

## 2017-08-12 DIAGNOSIS — F419 Anxiety disorder, unspecified: Secondary | ICD-10-CM | POA: Insufficient documentation

## 2017-08-12 DIAGNOSIS — J029 Acute pharyngitis, unspecified: Secondary | ICD-10-CM | POA: Diagnosis not present

## 2017-08-12 DIAGNOSIS — Z7689 Persons encountering health services in other specified circumstances: Secondary | ICD-10-CM | POA: Insufficient documentation

## 2017-08-12 DIAGNOSIS — F329 Major depressive disorder, single episode, unspecified: Secondary | ICD-10-CM | POA: Diagnosis not present

## 2017-08-12 DIAGNOSIS — D709 Neutropenia, unspecified: Secondary | ICD-10-CM

## 2017-08-12 DIAGNOSIS — R05 Cough: Secondary | ICD-10-CM | POA: Insufficient documentation

## 2017-08-12 MED ORDER — SODIUM CHLORIDE 0.9 % IV SOLN
Freq: Once | INTRAVENOUS | Status: AC
  Administered 2017-08-12: 09:00:00 via INTRAVENOUS
  Filled 2017-08-12: qty 1000

## 2017-08-12 MED ORDER — TBO-FILGRASTIM 480 MCG/0.8ML ~~LOC~~ SOSY
480.0000 ug | PREFILLED_SYRINGE | Freq: Once | SUBCUTANEOUS | Status: AC
  Administered 2017-08-12: 480 ug via SUBCUTANEOUS
  Filled 2017-08-12: qty 0.8

## 2017-08-12 MED ORDER — HEPARIN SOD (PORK) LOCK FLUSH 100 UNIT/ML IV SOLN
INTRAVENOUS | Status: AC
  Filled 2017-08-12: qty 5

## 2017-08-12 NOTE — Progress Notes (Signed)
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 Janese Banks, on 08/11/17. Patient initially presented to Dr. Pryor Ochoa for evaluation of right neck mass that had been present for 1 year and growing.  Had right ear pain.  Never smoker.  Ultrasound on 05/30/17 showed single enlarged lymph node in right neck measuring 4.8 x 1.7 x 3.0 cm.  Patient underwent a guided lymph node biopsy of cervical lymph node which showed metastatic squamous cell carcinoma.  Staining- P 16 +, sox 10 negative. PET/CT on 06/09/2017 showed intensely hypermetabolic enlarged right, level 2 cervical LN. No clear site of primary malignancy but potential site of investigation at right base of tongue with SUV 4.5. Biopsy of right tongue base positive for SCC. Plan to proceed with concurrent cisplatin/RT. Baseline audiogram was normal.  Complications with treatment: dysgevsia, nosebleeds, insomnia, fatigue   Interval history-patient seen while receiving IV fluids and infusion.  Complains of facial swelling present at this morning.  Notices swelling of right cheek and upper lip.  Feels symptoms have gradually improved in the past couple of hours.  Skin feels less tight.  No other associated symptoms.  Nothing seems to make symptoms worse.  Has not had this happen in the past.  Has had ongoing cough and fatigue thought to be related to URI- treated with 7 days of levaquin- no pna on cxr. Lack of taste is unchanged. Told she had  thrush yesterday and prescribed diflucan. Received neupogen yesterday and today. Has required IV fluids several times for dehydration.    ECOG FS:0 - Asymptomatic  Review of systems- Review of Systems  Constitutional: Negative.   HENT: Positive for congestion and sore throat.   Eyes: Negative.   Respiratory: Positive for cough and sputum production. Negative for shortness of breath and wheezing.   Cardiovascular: Negative.   Gastrointestinal: Negative.   Genitourinary: Negative.   Musculoskeletal: Negative.   Skin:       Right cheek swelling (mild)  Psychiatric/Behavioral: The patient is nervous/anxious.      Current treatment- s/p 3 weekly doses of cisplatin (last 07/19/17). Radiation on hold.   Allergies  Allergen Reactions  . Cefdinir Swelling    Swelling in mouth   . Cephalosporins Swelling  . Dairy Aid [Lactase] Swelling    Milk and milk products cause swelling and tingling of tongue  . Flagyl [Metronidazole] Swelling    Mouth and throat and ears go red and burn  . Lactose Intolerance (Gi) Other (See Comments)    GI upset.  Eats only gluten free  . Penicillins Rash    Rash/hives  . Phenylephrine-Guaifenesin Anaphylaxis    Night terrors  . Sulfonamide Derivatives Rash    Rash/itching.  From head to her toes, the itching was horrible. Penicillin is the worst  . Tape Rash    Adhesives all cause a problem severely.  It doesn't matter if it is paper tape or not.  Marland Kitchen Dexilant [Dexlansoprazole]     Pressure in head like head is in a  vice and being squeezed  . Entex Lq [Phenylephrine-Guaifenesin]     Night terrors  . Other Other (See Comments)    Flu vaccine:  Weakness, dizziness, tia type symptoms.    . Oxycodone Nausea And Vomiting    And itching  . Succinylcholine Other (See Comments)    Trouble waking up  . Codeine Nausea And Vomiting  . Guaifenesin & Derivatives Other (See Comments)    Night terrors  . Lactobacillus Itching  . Naproxen Sodium     Itching/rash    . Pseudoephedrine Rash  . Sulfa Antibiotics Rash  . Tetracyclines & Related Other (See Comments)    Was taking flagyl at the same time; unsure which caused the swelling  Swelling of throat. Pharmacist states that he believes it was the flagyl and not the tetracycline  . Wheat Bran Other (See Comments)    Upset stomach. Now eats gluten free     Past Medical History:  Diagnosis Date  . AA (aortic aneurysm) ([place])    a. 04/2011 Echo: Ao Root: 4.1cm, Asc Ao 4.7cm.  . Anxiety   . AV block, 2nd degree    a. 05/2005 - s/p MDT Adapta ADDR01 Dual Chamber PPM  . Bicuspid aortic valve   . Cancer ([place])   . Chest pain    a. 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  . Depression   . Expressive aphasia    a. ongoing since 04/2011 - seen by neurology - ? TIA vs. Migraine  . Facial numbness    a. ongoing since 04/2011 - seen by neurology - ? TIA vs. Migraine  . GERD (gastroesophageal reflux disease)   . Low blood pressure   . Presence of permanent cardiac pacemaker   . 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 Syncope    a. 04/2011 Echo: EF 55-65%, No RWMA, Gr 1 DD.     Past Surgical History:  Procedure Laterality Date  . ABDOMINAL WALL MESH  REMOVAL    . bladder tack    . cataract Bilateral 2004  . CHOLECYSTECTOMY  2000  . DILATION AND CURETTAGE OF UTERUS    . DIRECT LARYNGOSCOPY Right 06/16/2017   Procedure: MICRO DIRECT LARYNGOSCOPY WITH BIOPSY OF RIGHT BASE OF TONGUE;  Surgeon: Carloyn Manner, MD;  Location: [facility];  Service: ENT;  Laterality: Right;  . EP IMPLANTABLE DEVICE N/A 06/04/2015   Procedure: PPM Generator Changeout;  Surgeon: Deboraha Sprang, MD;  Location: [place] [facility];  Service: Cardiovascular;  Laterality: N/A;  . ESOPHAGOGASTRODUODENOSCOPY (EGD) WITH PROPOFOL N/A 12/18/2015   Procedure: ESOPHAGOGASTRODUODENOSCOPY (EGD) WITH gastric biopsy and dilation;  Surgeon: Lucilla Lame, MD;  Location: [place];  Service: Endoscopy;  Laterality: N/A;   . EYE SURGERY    . INSERT / REPLACE / REMOVE PACEMAKER    . IR FLUORO GUIDE PORT INSERTION RIGHT  06/22/2017  . PACEMAKER INSERTION  2006   Med[employer] Adapta ADDR01  . TONSILLECTOMY    . TONSILLECTOMY    . VAGINAL HYSTERECTOMY  2013   mad/cope    Social History   Socioeconomic History  . Marital status: Married    Spouse name: Not on file  . Number of children: Not on file  . Years of education: Not on file  . Highest education level: Not on file  Social Needs  . Financial resource strain: Not on file  . Food insecurity - worry: Not on file  . Food insecurity - inability: Not on file  .  Transportation needs - medical: Not on file  . Transportation needs - non-medical: Not on file  Occupational History  . Not on file  Tobacco Use  . Smoking status: Never Smoker  . Smokeless tobacco: Never Used  . Tobacco comment: tobacco use -no  Substance and Sexual Activity  . Alcohol use: No  . Drug use: No  . Sexual activity: Yes    Birth control/protection: Surgical  Other Topics Concern  . Not on file  Social History Narrative   Lives in [place] with husband.  Works out regularly.  Works as a Geophysicist/field seismologist - owns own business.     Family History  Problem Relation Age of Onset  . COPD Mother        alive @ 31  . Atrial fibrillation Mother   . Stroke Father        died @ 47  . Breast cancer Maternal Aunt 70  . COPD Maternal Grandmother   . Ovarian cancer Neg Hx   . Diabetes Neg Hx      Current Outpatient Medications:  .  aspirin 81 MG tablet, Take 81 mg by mouth daily., Disp: , Rfl:  .  b complex vitamins tablet, Take 1 tablet by mouth daily., Disp: , Rfl:  .  BLACK ELDERBERRY,BERRY-FLOWER, PO, Take 1 capsule by mouth daily., Disp: , Rfl:  .  CRANBERRY SOFT PO, Take 2 each by mouth daily. , Disp: , Rfl:  .  dexamethasone (DECADRON) 4 MG tablet, Take 2 tablets by mouth once a day on the day after chemotherapy and then take 2 tablets two times a day for 2  days. Take with food., Disp: 30 tablet, Rfl: 1 .  Digestive Enzymes (DIGESTIVE ENZYME PO), Take 1-2 capsules by mouth daily., Disp: , Rfl:  .  fexofenadine (ALLEGRA) 60 MG tablet, Take 60 mg by mouth daily as needed. , Disp: , Rfl:  .  fluconazole (DIFLUCAN) 100 MG tablet, Take 1 tablet (100 mg total) by mouth daily. Take 2 tablets day 1, then 1 tablet daily til complete, Disp: 9 tablet, Rfl: 0 .  fluticasone (FLONASE SENSIMIST) 27.5 MCG/SPRAY nasal spray, Place 1 spray into the nose daily as needed. , Disp: , Rfl:  .  HYDROcodone-acetaminophen (HYCET) 7.5-325 mg/15 ml solution, Take 10 mLs by mouth 4 (four) times daily as needed for moderate pain. (Patient not taking: Reported on 08/11/2017), Disp: 300 mL, Rfl: 0 .  lidocaine-prilocaine (EMLA) cream, Apply to affected area once, Disp: 30 g, Rfl: 3 .  loratadine (CLARITIN) 10 MG tablet, Take 10 mg by mouth daily as needed. , Disp: , Rfl:  .  LORazepam (ATIVAN) 0.5 MG tablet, Take 1 tablet (0.5 mg total) by mouth every 6 (six) hours as needed (Nausea or vomiting)., Disp: 30 tablet, Rfl: 0 .  magic mouthwash SOLN, Take 5 mLs by mouth 4 (four) times daily as needed for mouth pain. Swish and swallow, Disp: 420 mL, Rfl: 1 .  nitroGLYCERIN (NITROSTAT) 0.4 MG SL tablet, Place 1 tablet (0.4 mg total) under the tongue every 5 (five) minutes as needed for chest pain., Disp: 25 tablet, Rfl: 3 .  NON FORMULARY, Take by mouth daily. Bone Up (calcium) , Disp: , Rfl:  .  ondansetron (ZOFRAN) 4 MG tablet, Take 1 tablet (4 mg total) by mouth every 8 (eight) hours as needed for up to 10 doses for nausea or vomiting., Disp: 20 tablet, Rfl: 0 .  ondansetron (ZOFRAN) 8 MG tablet, Take 1 tablet (8  mg total) by mouth 2 (two) times daily as needed. Start on the third day after chemotherapy., Disp: 30 tablet, Rfl: 1 .  Polyethyl Glycol-Propyl Glycol (SYSTANE) 0.4-0.3 % GEL ophthalmic gel, Place 1 application into both eyes daily as needed., Disp: , Rfl:  .  PRESCRIPTION  MEDICATION, Place 1 tablet vaginally 3 (three) times a week. E3 1mg  and Testosterone 1mg  compound, Disp: , Rfl:  .  prochlorperazine (COMPAZINE) 10 MG tablet, Take 1 tablet (10 mg total) by mouth every 6 (six) hours as needed (Nausea or vomiting)., Disp: 30 tablet, Rfl: 1 .  sucralfate (CARAFATE) 1 g tablet, Take 1 tablet (1 g total) by mouth 3 (three) times daily. Dissolve in 2-3 tbsp warm water, swish and swallow., Disp: 90 tablet, Rfl: 3 No current facility-administered medications for this visit.   Facility-Administered Medications Ordered in Other Visits:  .  0.9 %  sodium chloride infusion, , Intravenous, Once, Sindy Guadeloupe, MD .  Tbo-Filgrastim [facility]) injection 480 mcg, 480 mcg, Subcutaneous, Once, Sindy Guadeloupe, MD  Physical exam: There were no vitals filed for this visit.  Constitutional: She is oriented to person, place, and time.  Head: Normocephalic and atraumatic.  Thrush noted on tongue.  Eyes: EOM are normal. Pupils are equal, round, and reactive to light.  Right cheek- mild swelling exhibited by some fullness of skin. Does not pit. No swelling of lip observed.  Neck: Normal range of motion.  Cardiovascular: Normal rate, regular rhythm and normal heart sounds.  Pulmonary/Chest: Effort normal and breath sounds normal.  Abdominal: Soft. Bowel sounds are normal.  Neurological: She is alert and oriented to person, place, and time. Quite anxious. Skin: Skin is warm and dry.    CMP Latest Ref Rng & Units 08/11/2017  Glucose 65 - 99 mg/dL 96  BUN 6 - 20 mg/dL 13  Creatinine 0.44 - 1.00 mg/dL 0.74  Sodium 135 - 145 mmol/L 139  Potassium 3.5 - 5.1 mmol/L 3.9  Chloride 101 - 111 mmol/L 104  CO2 22 - 32 mmol/L 30  Calcium 8.9 - 10.3 mg/dL 8.7(L)  Total Protein 6.5 - 8.1 g/dL 5.9(L)  Total Bilirubin 0.3 - 1.2 mg/dL 0.9  Alkaline Phos 38 - 126 U/L 67  AST 15 - 41 U/L 20  ALT 14 - 54 U/L 21   CBC Latest Ref Rng & Units 08/11/2017  WBC 3.6 - 11.0 K/uL 1.5(L)  Hemoglobin  12.0 - 16.0 g/dL 10.6(L)  Hematocrit 35.0 - 47.0 % 30.9(L)  Platelets 150 - 440 K/uL 64(L)    No images are attached to the encounter.  Dg Chest 2 View  Result Date: 08/07/2017 CLINICAL DATA:  Productive cough and fever. Oropharyngeal squamous 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 therapy. EXAM: CHEST  2 VIEW COMPARISON:  None. FINDINGS: The heart size and mediastinal contours are within normal limits. 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 Right-sided Port-A-Cath also in expected position. No pneumothorax identified. Both lungs are clear. The visualized skeletal structures are unremarkable. IMPRESSION: No active cardiopulmonary disease. Electronically Signed   By: Earle Gell M.D.   On: 08/07/2017 20:58     Assessment and plan- Patient is a 62 y.o. female who presents to symptom management clinic concerns facial swelling.  1.  Squamous cell carcinoma of tongue base HPV positive-stage I-radiation currently on hold due to cytopenias and ongoing symptoms.  Last dose of weekly cisplatin (cycle 3) on 07/19/17.  Continues to have neutropenia and thrombocytopenia.  Chemo on hold this week.  Plan  for dose reduction for cycle 4.  Receive Neupogen yesterday and today for neutropenia.  Follow-up with Dr. Janese Banks as previously scheduled  2. Facial swelling-minimal swelling of right cheek.  Suspect related to oral thrush. Monitor at this time. Discussed for patient to return to clinic if symptoms worsen, trouble breathing or worsening swallowing, or if symptoms do not improve over next 24-48 hours.    Visit Diagnosis 1. Squamous cell carcinoma of oropharynx ([place])   2. Facial edema     Patient expressed understanding and was in agreement with this plan. She also understands that She can call clinic at any time with any questions, concerns, or complaints.    [name], DNP, AGNP-C [place] [facility] [phone]  (office) 08/12/17 11:51 AM

## 2017-08-13 ENCOUNTER — Other Ambulatory Visit: Payer: Self-pay | Admitting: Oncology

## 2017-08-13 MED ORDER — LEVOFLOXACIN 500 MG PO TABS: 500.0000 mg | ORAL_TABLET | Freq: Every day | ORAL | 0 refills | Status: DC

## 2017-08-15 ENCOUNTER — Ambulatory Visit: Payer: BLUE CROSS/BLUE SHIELD

## 2017-08-15 ENCOUNTER — Other Ambulatory Visit: Payer: Self-pay | Admitting: *Deleted

## 2017-08-15 ENCOUNTER — Inpatient Hospital Stay: Payer: BLUE CROSS/BLUE SHIELD

## 2017-08-15 ENCOUNTER — Ambulatory Visit: Payer: Self-pay | Admitting: Nurse Practitioner

## 2017-08-15 ENCOUNTER — Other Ambulatory Visit: Payer: Self-pay

## 2017-08-15 ENCOUNTER — Encounter: Payer: Self-pay | Admitting: Nurse Practitioner

## 2017-08-15 DIAGNOSIS — R05 Cough: Secondary | ICD-10-CM | POA: Diagnosis not present

## 2017-08-15 DIAGNOSIS — B379 Candidiasis, unspecified: Secondary | ICD-10-CM | POA: Diagnosis not present

## 2017-08-15 DIAGNOSIS — Z7689 Persons encountering health services in other specified circumstances: Secondary | ICD-10-CM | POA: Diagnosis not present

## 2017-08-15 DIAGNOSIS — C109 Malignant neoplasm of oropharynx, unspecified: Secondary | ICD-10-CM

## 2017-08-15 DIAGNOSIS — R630 Anorexia: Secondary | ICD-10-CM | POA: Diagnosis not present

## 2017-08-15 DIAGNOSIS — M79671 Pain in right foot: Secondary | ICD-10-CM | POA: Diagnosis not present

## 2017-08-15 DIAGNOSIS — E860 Dehydration: Secondary | ICD-10-CM | POA: Diagnosis not present

## 2017-08-15 DIAGNOSIS — I951 Orthostatic hypotension: Secondary | ICD-10-CM | POA: Diagnosis not present

## 2017-08-15 DIAGNOSIS — R110 Nausea: Secondary | ICD-10-CM | POA: Diagnosis not present

## 2017-08-15 DIAGNOSIS — D709 Neutropenia, unspecified: Secondary | ICD-10-CM | POA: Diagnosis not present

## 2017-08-15 DIAGNOSIS — R609 Edema, unspecified: Secondary | ICD-10-CM | POA: Diagnosis not present

## 2017-08-15 DIAGNOSIS — H9201 Otalgia, right ear: Secondary | ICD-10-CM | POA: Diagnosis not present

## 2017-08-15 DIAGNOSIS — D696 Thrombocytopenia, unspecified: Secondary | ICD-10-CM | POA: Diagnosis not present

## 2017-08-15 DIAGNOSIS — R634 Abnormal weight loss: Secondary | ICD-10-CM | POA: Diagnosis not present

## 2017-08-15 DIAGNOSIS — R5383 Other fatigue: Secondary | ICD-10-CM | POA: Diagnosis not present

## 2017-08-15 DIAGNOSIS — Z5111 Encounter for antineoplastic chemotherapy: Secondary | ICD-10-CM | POA: Diagnosis not present

## 2017-08-15 LAB — CBC WITH DIFFERENTIAL/PLATELET
Basophils Absolute: 0 10*3/uL (ref 0–0.1)
Basophils Relative: 0 %
EOS PCT: 0 %
Eosinophils Absolute: 0 10*3/uL (ref 0–0.7)
HCT: 31 % — ABNORMAL LOW (ref 35.0–47.0)
Hemoglobin: 10.9 g/dL — ABNORMAL LOW (ref 12.0–16.0)
LYMPHS ABS: 0.3 10*3/uL — AB (ref 1.0–3.6)
LYMPHS PCT: 14 %
MCH: 31.9 pg (ref 26.0–34.0)
MCHC: 35 g/dL (ref 32.0–36.0)
MCV: 91.3 fL (ref 80.0–100.0)
Monocytes Absolute: 0.4 10*3/uL (ref 0.2–0.9)
Monocytes Relative: 21 %
Neutro Abs: 1.4 10*3/uL (ref 1.4–6.5)
Neutrophils Relative %: 65 %
PLATELETS: 96 10*3/uL — AB (ref 150–440)
RBC: 3.4 MIL/uL — ABNORMAL LOW (ref 3.80–5.20)
RDW: 14.2 % (ref 11.5–14.5)
WBC: 2.1 10*3/uL — ABNORMAL LOW (ref 3.6–11.0)

## 2017-08-15 LAB — COMPREHENSIVE METABOLIC PANEL
ALT: 18 U/L (ref 14–54)
AST: 19 U/L (ref 15–41)
Albumin: 3.2 g/dL — ABNORMAL LOW (ref 3.5–5.0)
Alkaline Phosphatase: 80 U/L (ref 38–126)
Anion gap: 11 (ref 5–15)
BUN: 10 mg/dL (ref 6–20)
CHLORIDE: 106 mmol/L (ref 101–111)
CO2: 25 mmol/L (ref 22–32)
CREATININE: 0.74 mg/dL (ref 0.44–1.00)
Calcium: 8.7 mg/dL — ABNORMAL LOW (ref 8.9–10.3)
GFR calc Af Amer: >60 mL/min (ref >60)
Glucose, Bld: 124 mg/dL — ABNORMAL HIGH (ref 65–99)
Potassium: 3.5 mmol/L (ref 3.5–5.1)
Sodium: 142 mmol/L (ref 135–145)
Total Bilirubin: 0.7 mg/dL (ref 0.3–1.2)
Total Protein: 5.8 g/dL — ABNORMAL LOW (ref 6.5–8.1)

## 2017-08-15 MED ORDER — SODIUM CHLORIDE 0.9 % IV SOLN
Freq: Once | INTRAVENOUS | Status: AC
  Administered 2017-08-15: 10:00:00 via INTRAVENOUS
  Filled 2017-08-15: qty 1000

## 2017-08-15 MED ORDER — SODIUM CHLORIDE 0.9% FLUSH
10.0000 mL | INTRAVENOUS | Status: DC | PRN
  Administered 2017-08-15: 10 mL via INTRAVENOUS
  Filled 2017-08-15: qty 10

## 2017-08-15 MED ORDER — SODIUM CHLORIDE 0.9 % IV SOLN: 10.0000 mg | Freq: Once | INTRAVENOUS | Status: DC

## 2017-08-15 MED ORDER — SODIUM CHLORIDE 0.9 % IV SOLN: Freq: Once | INTRAVENOUS | Status: DC

## 2017-08-15 MED ORDER — ONDANSETRON HCL 4 MG/2ML IJ SOLN
8.0000 mg | Freq: Once | INTRAMUSCULAR | Status: AC
  Administered 2017-08-15: 8 mg via INTRAVENOUS
  Filled 2017-08-15: qty 4

## 2017-08-15 MED ORDER — HEPARIN SOD (PORK) LOCK FLUSH 100 UNIT/ML IV SOLN
500.0000 [IU] | Freq: Once | INTRAVENOUS | Status: AC
  Administered 2017-08-15: 500 [IU] via INTRAVENOUS
  Filled 2017-08-15: qty 5

## 2017-08-15 MED ORDER — DEXAMETHASONE SODIUM PHOSPHATE 10 MG/ML IJ SOLN
10.0000 mg | Freq: Once | INTRAMUSCULAR | Status: AC
  Administered 2017-08-15: 10 mg via INTRAVENOUS
  Filled 2017-08-15: qty 1

## 2017-08-15 MED ORDER — TBO-FILGRASTIM 480 MCG/0.8ML ~~LOC~~ SOSY
480.0000 ug | PREFILLED_SYRINGE | Freq: Once | SUBCUTANEOUS | Status: AC
  Administered 2017-08-15: 480 ug via SUBCUTANEOUS
  Filled 2017-08-15: qty 0.8

## 2017-08-15 MED ORDER — SODIUM CHLORIDE 0.9 % IV SOLN
Freq: Once | INTRAVENOUS | Status: DC
  Filled 2017-08-15: qty 1000

## 2017-08-15 NOTE — Progress Notes (Signed)
Nutrition Follow-up:  Patient seen in infusion during IV fluid infusion today.  Patient tearful during visit.  Reports that she feels bad and weak.  Reports that she is trying to eat and drink shakes but having a hard time.  Reports that food and liquids have no taste. Reports that most liquids burn mouth.  Was able to drink pedilyte over the weekend.    Medications: reviewed  Labs: reviewed   Anthropometrics:   No new weight taken   NUTRITION DIAGNOSIS: Predicted suboptimal energy intake continues   MALNUTRITION DIAGNOSIS: severe malnutrition continues   INTERVENTION:   Discussed with patient considering a feeding tube to supplement current oral intake.  Patient does not want feeding tube at this time.  Patient tearful and overwhelmed.  Discussed option of speaking with licensed clinical social worker here at clinic.  Encouraged patient to continue eating and drinking as able of high calorie, high protein foods.   Previously had encouraged patient to drink 3 of [employer] 1.0 shakes to meet half of calorie needs.  6 cartons would better meet nutritional needs if unable to eat anything.      MONITORING, EVALUATION, GOAL: weight trends, intake   NEXT VISIT: Feb 14   Avamarie Crossley B. [name], [place], [place] Registered Dietitian [phone] (pager)

## 2017-08-16 ENCOUNTER — Ambulatory Visit: Admission: RE | Admit: 2017-08-16 | Payer: BLUE CROSS/BLUE SHIELD | Source: Ambulatory Visit

## 2017-08-16 DIAGNOSIS — Z961 Presence of intraocular lens: Secondary | ICD-10-CM | POA: Diagnosis not present

## 2017-08-17 ENCOUNTER — Ambulatory Visit
Admission: RE | Admit: 2017-08-17 | Discharge: 2017-08-17 | Disposition: A | Discharge status: Home / Self Care | Payer: BLUE CROSS/BLUE SHIELD | Source: Ambulatory Visit | Attending: Radiation Oncology | Admitting: Radiation Oncology

## 2017-08-17 DIAGNOSIS — Z510 Encounter for antineoplastic radiation therapy: Secondary | ICD-10-CM | POA: Diagnosis not present

## 2017-08-17 DIAGNOSIS — C109 Malignant neoplasm of oropharynx, unspecified: Secondary | ICD-10-CM | POA: Diagnosis not present

## 2017-08-17 DIAGNOSIS — C01 Malignant neoplasm of base of tongue: Secondary | ICD-10-CM | POA: Diagnosis not present

## 2017-08-18 ENCOUNTER — Other Ambulatory Visit: Payer: Self-pay | Admitting: *Deleted

## 2017-08-18 ENCOUNTER — Ambulatory Visit
Admission: RE | Admit: 2017-08-18 | Discharge: 2017-08-18 | Disposition: A | Discharge status: Home / Self Care | Payer: BLUE CROSS/BLUE SHIELD | Source: Ambulatory Visit | Attending: Radiation Oncology | Admitting: Radiation Oncology

## 2017-08-18 DIAGNOSIS — C01 Malignant neoplasm of base of tongue: Secondary | ICD-10-CM | POA: Diagnosis not present

## 2017-08-18 DIAGNOSIS — Z510 Encounter for antineoplastic radiation therapy: Secondary | ICD-10-CM | POA: Diagnosis not present

## 2017-08-18 DIAGNOSIS — C109 Malignant neoplasm of oropharynx, unspecified: Secondary | ICD-10-CM | POA: Diagnosis not present

## 2017-08-19 ENCOUNTER — Other Ambulatory Visit: Payer: Self-pay

## 2017-08-19 ENCOUNTER — Inpatient Hospital Stay (HOSPITAL_BASED_OUTPATIENT_CLINIC_OR_DEPARTMENT_OTHER): Payer: BLUE CROSS/BLUE SHIELD | Admitting: Oncology

## 2017-08-19 ENCOUNTER — Ambulatory Visit: Payer: Self-pay | Admitting: Oncology

## 2017-08-19 ENCOUNTER — Ambulatory Visit
Admission: RE | Admit: 2017-08-19 | Discharge: 2017-08-19 | Disposition: A | Discharge status: Home / Self Care | Payer: BLUE CROSS/BLUE SHIELD | Source: Ambulatory Visit | Attending: Radiation Oncology | Admitting: Radiation Oncology

## 2017-08-19 ENCOUNTER — Inpatient Hospital Stay: Payer: BLUE CROSS/BLUE SHIELD

## 2017-08-19 ENCOUNTER — Encounter: Payer: Self-pay | Admitting: Oncology

## 2017-08-19 VITALS — BP 105/73 | HR 92 | Temp 97.8°F | Resp 18 | Ht 64.0 in | Wt 163.0 lb

## 2017-08-19 DIAGNOSIS — T451X5A Adverse effect of antineoplastic and immunosuppressive drugs, initial encounter: Secondary | ICD-10-CM

## 2017-08-19 DIAGNOSIS — R609 Edema, unspecified: Secondary | ICD-10-CM

## 2017-08-19 DIAGNOSIS — C109 Malignant neoplasm of oropharynx, unspecified: Secondary | ICD-10-CM

## 2017-08-19 DIAGNOSIS — Z7689 Persons encountering health services in other specified circumstances: Secondary | ICD-10-CM | POA: Diagnosis not present

## 2017-08-19 DIAGNOSIS — I714 Abdominal aortic aneurysm, without rupture: Secondary | ICD-10-CM | POA: Diagnosis not present

## 2017-08-19 DIAGNOSIS — R634 Abnormal weight loss: Secondary | ICD-10-CM

## 2017-08-19 DIAGNOSIS — M79671 Pain in right foot: Secondary | ICD-10-CM | POA: Diagnosis not present

## 2017-08-19 DIAGNOSIS — Z7982 Long term (current) use of aspirin: Secondary | ICD-10-CM

## 2017-08-19 DIAGNOSIS — D709 Neutropenia, unspecified: Secondary | ICD-10-CM | POA: Diagnosis not present

## 2017-08-19 DIAGNOSIS — R05 Cough: Secondary | ICD-10-CM | POA: Diagnosis not present

## 2017-08-19 DIAGNOSIS — E860 Dehydration: Secondary | ICD-10-CM

## 2017-08-19 DIAGNOSIS — R5383 Other fatigue: Secondary | ICD-10-CM | POA: Diagnosis not present

## 2017-08-19 DIAGNOSIS — R079 Chest pain, unspecified: Secondary | ICD-10-CM

## 2017-08-19 DIAGNOSIS — D6959 Other secondary thrombocytopenia: Secondary | ICD-10-CM

## 2017-08-19 DIAGNOSIS — B379 Candidiasis, unspecified: Secondary | ICD-10-CM | POA: Diagnosis not present

## 2017-08-19 DIAGNOSIS — Z5111 Encounter for antineoplastic chemotherapy: Secondary | ICD-10-CM

## 2017-08-19 DIAGNOSIS — K219 Gastro-esophageal reflux disease without esophagitis: Secondary | ICD-10-CM

## 2017-08-19 DIAGNOSIS — R110 Nausea: Secondary | ICD-10-CM | POA: Diagnosis not present

## 2017-08-19 DIAGNOSIS — R4701 Aphasia: Secondary | ICD-10-CM

## 2017-08-19 DIAGNOSIS — F329 Major depressive disorder, single episode, unspecified: Secondary | ICD-10-CM

## 2017-08-19 DIAGNOSIS — C01 Malignant neoplasm of base of tongue: Secondary | ICD-10-CM | POA: Diagnosis not present

## 2017-08-19 DIAGNOSIS — F419 Anxiety disorder, unspecified: Secondary | ICD-10-CM | POA: Diagnosis not present

## 2017-08-19 DIAGNOSIS — Z9049 Acquired absence of other specified parts of digestive tract: Secondary | ICD-10-CM

## 2017-08-19 DIAGNOSIS — H9201 Otalgia, right ear: Secondary | ICD-10-CM | POA: Diagnosis not present

## 2017-08-19 DIAGNOSIS — D701 Agranulocytosis secondary to cancer chemotherapy: Secondary | ICD-10-CM

## 2017-08-19 DIAGNOSIS — I951 Orthostatic hypotension: Secondary | ICD-10-CM | POA: Diagnosis not present

## 2017-08-19 DIAGNOSIS — Z79899 Other long term (current) drug therapy: Secondary | ICD-10-CM

## 2017-08-19 DIAGNOSIS — D696 Thrombocytopenia, unspecified: Secondary | ICD-10-CM | POA: Diagnosis not present

## 2017-08-19 DIAGNOSIS — R630 Anorexia: Secondary | ICD-10-CM | POA: Diagnosis not present

## 2017-08-19 DIAGNOSIS — Z803 Family history of malignant neoplasm of breast: Secondary | ICD-10-CM

## 2017-08-19 DIAGNOSIS — Z510 Encounter for antineoplastic radiation therapy: Secondary | ICD-10-CM | POA: Diagnosis not present

## 2017-08-19 LAB — CBC WITH DIFFERENTIAL/PLATELET
Basophils Absolute: 0 10*3/uL (ref 0–0.1)
Basophils Relative: 0 %
Eosinophils Absolute: 0 10*3/uL (ref 0–0.7)
Eosinophils Relative: 0 %
HEMATOCRIT: 32.5 % — AB (ref 35.0–47.0)
HEMOGLOBIN: 11.2 g/dL — AB (ref 12.0–16.0)
LYMPHS ABS: 0.4 10*3/uL — AB (ref 1.0–3.6)
LYMPHS PCT: 6 %
MCH: 32 pg (ref 26.0–34.0)
MCHC: 34.5 g/dL (ref 32.0–36.0)
MCV: 92.6 fL (ref 80.0–100.0)
MONOS PCT: 9 %
Monocytes Absolute: 0.6 10*3/uL (ref 0.2–0.9)
NEUTROS ABS: 5.7 10*3/uL (ref 1.4–6.5)
NEUTROS PCT: 85 %
Platelets: 156 10*3/uL (ref 150–440)
RBC: 3.5 MIL/uL — ABNORMAL LOW (ref 3.80–5.20)
RDW: 15.5 % — ABNORMAL HIGH (ref 11.5–14.5)
WBC: 6.8 10*3/uL (ref 3.6–11.0)

## 2017-08-19 MED ORDER — SODIUM CHLORIDE 0.9 % IV SOLN
Freq: Once | INTRAVENOUS | Status: AC
  Administered 2017-08-19: 12:00:00 via INTRAVENOUS
  Filled 2017-08-19: qty 5

## 2017-08-19 MED ORDER — SODIUM CHLORIDE 0.9 % IV SOLN
30.0000 mg/m2 | Freq: Once | INTRAVENOUS | Status: AC
  Administered 2017-08-19: 57 mg via INTRAVENOUS
  Filled 2017-08-19: qty 57

## 2017-08-19 MED ORDER — PALONOSETRON HCL INJECTION 0.25 MG/5ML
0.2500 mg | Freq: Once | INTRAVENOUS | Status: AC
  Administered 2017-08-19: 0.25 mg via INTRAVENOUS
  Filled 2017-08-19: qty 5

## 2017-08-19 MED ORDER — POTASSIUM CHLORIDE 2 MEQ/ML IV SOLN
Freq: Once | INTRAVENOUS | Status: AC
  Administered 2017-08-19: 10:00:00 via INTRAVENOUS
  Filled 2017-08-19: qty 1000

## 2017-08-19 MED ORDER — SODIUM CHLORIDE 0.9 % IV SOLN
Freq: Once | INTRAVENOUS | Status: AC
  Administered 2017-08-19: 10:00:00 via INTRAVENOUS
  Filled 2017-08-19: qty 1000

## 2017-08-19 MED ORDER — HEPARIN SOD (PORK) LOCK FLUSH 100 UNIT/ML IV SOLN
500.0000 [IU] | Freq: Once | INTRAVENOUS | Status: AC
  Administered 2017-08-19: 500 [IU] via INTRAVENOUS
  Filled 2017-08-19: qty 5

## 2017-08-19 MED ORDER — SODIUM CHLORIDE 0.9% FLUSH
10.0000 mL | Freq: Once | INTRAVENOUS | Status: AC
  Administered 2017-08-19: 10 mL via INTRAVENOUS
  Filled 2017-08-19: qty 10

## 2017-08-19 NOTE — Progress Notes (Signed)
Pt never feels good. But she is better today thatn she has been in a week. She has some sores healing on her lips. She is using carafate to swish in her mouth and then spit out because when she swallows it she ends up vomiting. She is drinking 2-3 nutritional drinks that dietician got for her each day. Breakfast is her best meal. Everything does not taste good, she is using biotene toothpast and moutwash. She is dry in her mouth, skin, eyes.

## 2017-08-22 ENCOUNTER — Inpatient Hospital Stay: Payer: BLUE CROSS/BLUE SHIELD

## 2017-08-22 ENCOUNTER — Ambulatory Visit
Admission: RE | Admit: 2017-08-22 | Discharge: 2017-08-22 | Disposition: A | Discharge status: Home / Self Care | Payer: BLUE CROSS/BLUE SHIELD | Source: Ambulatory Visit | Attending: Radiation Oncology | Admitting: Radiation Oncology

## 2017-08-22 VITALS — BP 113/75 | HR 71 | Temp 97.0°F | Resp 18

## 2017-08-22 DIAGNOSIS — E860 Dehydration: Secondary | ICD-10-CM | POA: Diagnosis not present

## 2017-08-22 DIAGNOSIS — Z7689 Persons encountering health services in other specified circumstances: Secondary | ICD-10-CM | POA: Diagnosis not present

## 2017-08-22 DIAGNOSIS — R630 Anorexia: Secondary | ICD-10-CM | POA: Diagnosis not present

## 2017-08-22 DIAGNOSIS — Z510 Encounter for antineoplastic radiation therapy: Secondary | ICD-10-CM | POA: Diagnosis not present

## 2017-08-22 DIAGNOSIS — D709 Neutropenia, unspecified: Secondary | ICD-10-CM | POA: Diagnosis not present

## 2017-08-22 DIAGNOSIS — R5383 Other fatigue: Secondary | ICD-10-CM | POA: Diagnosis not present

## 2017-08-22 DIAGNOSIS — R609 Edema, unspecified: Secondary | ICD-10-CM | POA: Diagnosis not present

## 2017-08-22 DIAGNOSIS — H9201 Otalgia, right ear: Secondary | ICD-10-CM | POA: Diagnosis not present

## 2017-08-22 DIAGNOSIS — I951 Orthostatic hypotension: Secondary | ICD-10-CM | POA: Diagnosis not present

## 2017-08-22 DIAGNOSIS — C109 Malignant neoplasm of oropharynx, unspecified: Secondary | ICD-10-CM | POA: Diagnosis not present

## 2017-08-22 DIAGNOSIS — Z5111 Encounter for antineoplastic chemotherapy: Secondary | ICD-10-CM | POA: Diagnosis not present

## 2017-08-22 DIAGNOSIS — R110 Nausea: Secondary | ICD-10-CM | POA: Diagnosis not present

## 2017-08-22 DIAGNOSIS — R05 Cough: Secondary | ICD-10-CM | POA: Diagnosis not present

## 2017-08-22 DIAGNOSIS — C01 Malignant neoplasm of base of tongue: Secondary | ICD-10-CM | POA: Diagnosis not present

## 2017-08-22 DIAGNOSIS — R634 Abnormal weight loss: Secondary | ICD-10-CM | POA: Diagnosis not present

## 2017-08-22 DIAGNOSIS — D696 Thrombocytopenia, unspecified: Secondary | ICD-10-CM | POA: Diagnosis not present

## 2017-08-22 DIAGNOSIS — B379 Candidiasis, unspecified: Secondary | ICD-10-CM | POA: Diagnosis not present

## 2017-08-22 DIAGNOSIS — M79671 Pain in right foot: Secondary | ICD-10-CM | POA: Diagnosis not present

## 2017-08-22 MED ORDER — SODIUM CHLORIDE 0.9 % IV SOLN
INTRAVENOUS | Status: DC
  Administered 2017-08-22: 12:00:00 via INTRAVENOUS
  Filled 2017-08-22 (×2): qty 1000

## 2017-08-22 MED ORDER — HEPARIN SOD (PORK) LOCK FLUSH 100 UNIT/ML IV SOLN
500.0000 [IU] | Freq: Once | INTRAVENOUS | Status: AC
  Administered 2017-08-22: 500 [IU] via INTRAVENOUS
  Filled 2017-08-22: qty 5

## 2017-08-22 MED ORDER — SODIUM CHLORIDE 0.9% FLUSH
10.0000 mL | INTRAVENOUS | Status: DC | PRN
  Filled 2017-08-22: qty 10

## 2017-08-22 MED ORDER — HEPARIN SOD (PORK) LOCK FLUSH 100 UNIT/ML IV SOLN
INTRAVENOUS | Status: AC
  Filled 2017-08-22: qty 5

## 2017-08-22 NOTE — Progress Notes (Signed)
Patient states she felt better over the weekend than typically after treatment, drank more fluids than before, and only wants to receive one liter of fluid today over one hour.  Spoke with Sherry/Dr. Janese Banks who agrees.

## 2017-08-22 NOTE — Progress Notes (Signed)
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 Consuela Mimes evaluation of right neck mass. The mass has been present for or a year and has been growing gradually. It was associated with some right ear pain but no fever. Patient has been a never smoker.Ultrasound on 05/30/2017 showed:Single enlarged lymph node in the right neck measures approximately 4.8 x 1.7 x 3.0 cm  2.Patient underwent IR guided lymph node biopsy of the cervical lymph node which showed metastatic squamous cell carcinoma. Staining for P 16 was positive and sox 10 was negative   3.PET/CT scan on 06/09/2017 showed intensely hypermetabolic enlarged right level 2 cervical lymph node with an SUV of 11.1. No clear site of primary malignancy within the head and neck. Potential site of investigation would include right base of tongue. There was asymmetric metabolic activity noted in that area with an SUV of 4.5. Less suspicious region in the posterior glottis. No evidence of distant metastatic disease.  4.Patient works as a Geophysicist/field seismologist at [employer]. Patient has had multiple allergies to medications in the past. Reports that when she took steroids in the past it made her quite aggravated. Her sister was hospitalized in a mental health facility after receiving a steroid course in the past.  5. Right tongue base biopsy positive for SCC. Plan is  to proceed with concurrent chemo/RT with cisplatin/RT. Baseline audiogram normal   Interval history-chemo has been on hold for 3 weeks now due to ongoing cytopenias mucositis and thrush.  Radiation was started after a week's break a couple of days ago.  Patient is also completed her course of Diflucan for her thrush and her thrush has resolved.  She continues to feel fatigued denies any significant pain.  She is trying to keep up with her oral intake.  She has lost 5 pounds over the last 2 weeks.  Overall feels better as compared to last week.  Cough is improved after second round of antibiotics.  She denies any fever  ECOG PS- 1 Pain scale- 0 Opioid associated constipation- no  Review of systems- Review of Systems  Constitutional: Positive for malaise/fatigue and weight loss. Negative for chills and fever.  HENT: Negative for congestion, ear discharge and nosebleeds.   Eyes: Negative for blurred vision.  Respiratory: Negative for cough, hemoptysis, sputum production, shortness of breath and wheezing.   Cardiovascular: Negative for chest pain, palpitations, orthopnea and claudication.  Gastrointestinal: Negative for abdominal pain, blood in stool, constipation, diarrhea, heartburn, melena, nausea and vomiting.  Genitourinary: Negative for dysuria, flank pain, frequency, hematuria and urgency.  Musculoskeletal: Negative for back pain, joint pain and myalgias.  Skin: Negative for rash.  Neurological: Positive for weakness. Negative for dizziness, tingling, focal weakness, seizures and headaches.  Endo/Heme/Allergies: Does not bruise/bleed easily.  Psychiatric/Behavioral: Negative for depression and suicidal ideas. The patient does not have insomnia.       Allergies  Allergen Reactions  . Cefdinir Swelling    Swelling in mouth   . Cephalosporins Swelling  .  Dairy Aid [Lactase] Swelling    Milk and milk products cause swelling and tingling of tongue  . Flagyl [Metronidazole] Swelling     Mouth and throat and ears go red and burn  . Lactose Intolerance (Gi) Other (See Comments)    GI upset.  Eats only gluten free  . Penicillins Rash    Rash/hives  . Phenylephrine-Guaifenesin Anaphylaxis    Night terrors  . Sulfonamide Derivatives Rash    Rash/itching.  From head to her toes, the itching was horrible. Penicillin is the worst  . Tape Rash    Adhesives all cause a problem severely.  It doesn't matter if it is paper tape or not.  Marland Kitchen Dexilant [Dexlansoprazole]     Pressure in head like head is in a vice and being squeezed  . Entex Lq [Phenylephrine-Guaifenesin]     Night terrors  . Other Other (See Comments)    Flu vaccine:  Weakness, dizziness, tia type symptoms.    . Oxycodone Nausea And Vomiting    And itching  . Succinylcholine Other (See Comments)    Trouble waking up  . Codeine Nausea And Vomiting  . Guaifenesin & Derivatives Other (See Comments)    Night terrors  . Lactobacillus Itching  . Naproxen Sodium     Itching/rash  . Pseudoephedrine Rash  . Sulfa Antibiotics Rash  . Tetracyclines & Related Other (See Comments)    Was taking flagyl at the same time; unsure which caused the swelling  Swelling of throat. Pharmacist states that he believes it was the flagyl and not the tetracycline  . Wheat Bran Other (See Comments)    Upset stomach. Now eats gluten free     Past Medical History:  Diagnosis Date  . AA (aortic aneurysm) ([place])    a. 04/2011 Echo: Ao Root: 4.1cm, Asc Ao 4.7cm.  . Anxiety   . AV block, 2nd degree    a. 05/2005 - s/p MDT Adapta ADDR01 Dual Chamber PPM  . Bicuspid aortic valve   . Cancer ([place])   . Chest pain    a. 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  . Depression   . Expressive aphasia    a. ongoing since 04/2011 - seen by neurology - ? TIA vs. Migraine  . Facial numbness    a. ongoing since 04/2011 - seen by neurology - ? TIA vs. Migraine  . GERD (gastroesophageal reflux disease)   . Low blood pressure   . Presence of permanent cardiac  pacemaker   . 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 Syncope    a. 04/2011 Echo: EF 55-65%, No RWMA, Gr 1 DD.     Past Surgical History:  Procedure Laterality Date  . ABDOMINAL WALL MESH  REMOVAL    . bladder tack    . cataract Bilateral 2004  . CHOLECYSTECTOMY  2000  . DILATION AND CURETTAGE OF UTERUS    . DIRECT LARYNGOSCOPY Right 06/16/2017   Procedure: MICRO DIRECT LARYNGOSCOPY WITH BIOPSY OF RIGHT BASE OF TONGUE;  Surgeon: Carloyn Manner, MD;  Location: [facility];  Service: ENT;  Laterality: Right;  . EP IMPLANTABLE DEVICE N/A 06/04/2015   Procedure: PPM Generator Changeout;  Surgeon: Deboraha Sprang, MD;  Location: [facility];  Service: Cardiovascular;  Laterality: N/A;  . ESOPHAGOGASTRODUODENOSCOPY (EGD) WITH PROPOFOL N/A 12/18/2015   Procedure: ESOPHAGOGASTRODUODENOSCOPY (EGD) WITH gastric biopsy and dilation;  Surgeon: Lucilla Lame, MD;  Location: [facility];  Service: Endoscopy;  Laterality: N/A;  .  EYE SURGERY    . INSERT / REPLACE / REMOVE PACEMAKER    . IR FLUORO GUIDE PORT INSERTION RIGHT  06/22/2017  . PACEMAKER INSERTION  2006   Med[employer]onic Adapta ADDR01  . TONSILLECTOMY    . TONSILLECTOMY    . VAGINAL HYSTERECTOMY  2013   mad/cope    Social History   Socioeconomic History  . Marital status: Married    Spouse name: Not on file  . Number of children: Not on file  . Years of education: Not on file  . Highest education level: Not on file  Social Needs  . Financial resource strain: Not on file  . Food insecurity - worry: Not on file  . Food insecurity - inability: Not on file  . Transportation needs - medical: Not on file  . Transportation needs - non-medical: Not on file  Occupational History  . Not on file  Tobacco Use  . Smoking status: Never Smoker  . Smokeless tobacco: Never Used  . Tobacco comment: tobacco use -no  Substance and Sexual Activity  . Alcohol use: No  . Drug use: No  . Sexual activity: Yes     Birth control/protection: Surgical  Other Topics Concern  . Not on file  Social History Narrative   Lives in [place] with husband.  Works out regularly.  Works as a Geophysicist/field seismologist - owns own business.     Family History  Problem Relation Age of Onset  . COPD Mother        alive @ 19  . Atrial fibrillation Mother   . Stroke Father        died @ 70  . Breast cancer Maternal Aunt 70  . COPD Maternal Grandmother   . Ovarian cancer Neg Hx   . Diabetes Neg Hx      Current Outpatient Medications:  .  aspirin 81 MG tablet, Take 81 mg by mouth daily., Disp: , Rfl:  .  b complex vitamins tablet, Take 1 tablet by mouth daily., Disp: , Rfl:  .  CRANBERRY SOFT PO, Take 2 each by mouth daily. , Disp: , Rfl:  .  dexamethasone (DECADRON) 4 MG tablet, Take 2 tablets by mouth once a day on the day after chemotherapy and then take 2 tablets two times a day for 2 days. Take with food., Disp: 30 tablet, Rfl: 1 .  fexofenadine (ALLEGRA) 60 MG tablet, Take 60 mg by mouth daily as needed. , Disp: , Rfl:  .  fluconazole (DIFLUCAN) 100 MG tablet, Take 1 tablet (100 mg total) by mouth daily. Take 2 tablets day 1, then 1 tablet daily til complete, Disp: 9 tablet, Rfl: 0 .  fluticasone (FLONASE SENSIMIST) 27.5 MCG/SPRAY nasal spray, Place 1 spray into the nose daily as needed. , Disp: , Rfl:  .  HYDROcodone-acetaminophen (HYCET) 7.5-325 mg/15 ml solution, Take 10 mLs by mouth 4 (four) times daily as needed for moderate pain., Disp: 300 mL, Rfl: 0 .  levofloxacin (LEVAQUIN) 500 MG tablet, Take 1 tablet (500 mg total) by mouth daily., Disp: 7 tablet, Rfl: 0 .  lidocaine-prilocaine (EMLA) cream, Apply to affected area once, Disp: 30 g, Rfl: 3 .  loratadine (CLARITIN) 10 MG tablet, Take 10 mg by mouth daily as needed. , Disp: , Rfl:  .  LORazepam (ATIVAN) 0.5 MG tablet, Take 1 tablet (0.5 mg total) by mouth every 6 (six) hours as needed (Nausea or vomiting)., Disp: 30 tablet, Rfl: 0 .  NON FORMULARY,  Take  by mouth daily. Bone Up (calcium) , Disp: , Rfl:  .  ondansetron (ZOFRAN) 4 MG tablet, Take 1 tablet (4 mg total) by mouth every 8 (eight) hours as needed for up to 10 doses for nausea or vomiting., Disp: 20 tablet, Rfl: 0 .  ondansetron (ZOFRAN) 8 MG tablet, Take 1 tablet (8 mg total) by mouth 2 (two) times daily as needed. Start on the third day after chemotherapy., Disp: 30 tablet, Rfl: 1 .  Polyethyl Glycol-Propyl Glycol (SYSTANE) 0.4-0.3 % GEL ophthalmic gel, Place 1 application into both eyes daily as needed., Disp: , Rfl:  .  PRESCRIPTION MEDICATION, Place 1 tablet vaginally 3 (three) times a week. E3 1mg  and Testosterone 1mg  compound, Disp: , Rfl:  .  prochlorperazine (COMPAZINE) 10 MG tablet, Take 1 tablet (10 mg total) by mouth every 6 (six) hours as needed (Nausea or vomiting)., Disp: 30 tablet, Rfl: 1 .  sucralfate (CARAFATE) 1 g tablet, Take 1 tablet (1 g total) by mouth 3 (three) times daily. Dissolve in 2-3 tbsp warm water, swish and swallow., Disp: 90 tablet, Rfl: 3 .  BLACK ELDERBERRY,BERRY-FLOWER, PO, Take 1 capsule by mouth daily., Disp: , Rfl:  .  Digestive Enzymes (DIGESTIVE ENZYME PO), Take 1-2 capsules by mouth daily., Disp: , Rfl:  .  magic mouthwash SOLN, Take 5 mLs by mouth 4 (four) times daily as needed for mouth pain. Swish and swallow (Patient not taking: Reported on 08/19/2017), Disp: 420 mL, Rfl: 1 .  nitroGLYCERIN (NITROSTAT) 0.4 MG SL tablet, Place 1 tablet (0.4 mg total) under the tongue every 5 (five) minutes as needed for chest pain., Disp: 25 tablet, Rfl: 3 No current facility-administered medications for this visit.   Facility-Administered Medications Ordered in Other Visits:  .  0.9 %  sodium chloride infusion, , Intravenous, Once, Sindy Guadeloupe, MD  Physical exam:  Vitals:   08/19/17 0904  BP: 105/73  Pulse: 92  Resp: 18  Temp: 97.8 F (36.6 C)  TempSrc: Tympanic  Weight: 163 lb (73.9 kg)  Height: 5\' 4"  (1.626 m)   Physical Exam    Constitutional: She is oriented to person, place, and time.  Appears fatigued.  No acute distress  HENT:  Head: Normocephalic and atraumatic.  Mouth/Throat: Oropharynx is clear and moist.  Eyes: EOM are normal. Pupils are equal, round, and reactive to light.  Neck: Normal range of motion.  Cardiovascular: Normal rate, regular rhythm and normal heart sounds.  Pulmonary/Chest: Effort normal and breath sounds normal.  Abdominal: Soft. Bowel sounds are normal.  Lymphadenopathy:  right cervical adenopathy not clinically palpable  Neurological: She is alert and oriented to person, place, and time.  Skin: Skin is warm and dry.     CMP Latest Ref Rng & Units 08/15/2017  Glucose 65 - 99 mg/dL 124(H)  BUN 6 - 20 mg/dL 10  Creatinine 0.44 - 1.00 mg/dL 0.74  Sodium 135 - 145 mmol/L 142  Potassium 3.5 - 5.1 mmol/L 3.5  Chloride 101 - 111 mmol/L 106  CO2 22 - 32 mmol/L 25  Calcium 8.9 - 10.3 mg/dL 8.7(L)  Total Protein 6.5 - 8.1 g/dL 5.8(L)  Total Bilirubin 0.3 - 1.2 mg/dL 0.7  Alkaline Phos 38 - 126 U/L 80  AST 15 - 41 U/L 19  ALT 14 - 54 U/L 18   CBC Latest Ref Rng & Units 08/19/2017  WBC 3.6 - 11.0 K/uL 6.8  Hemoglobin 12.0 - 16.0 g/dL 11.2(L)  Hematocrit 35.0 - 47.0 % 32.5(L)  Platelets  150 - 440 K/uL 156    No images are attached to the encounter.  Dg Chest 2 View  Result Date: 08/07/2017 CLINICAL DATA:  Productive cough and fever. Oropharyngeal squamous 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 therapy. EXAM: CHEST  2 VIEW COMPARISON:  None. FINDINGS: The heart size and mediastinal contours are within normal limits. 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 Right-sided Port-A-Cath also in expected position. No pneumothorax identified. Both lungs are clear. The visualized skeletal structures are unremarkable. IMPRESSION: No active cardiopulmonary disease. Electronically Signed   By: Earle Gell M.D.   On: 08/07/2017 20:58     Assessment and plan- Patient  is a 62 y.o. 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  Also her mucositis is better and her oral thrush has resolved.  Counts are therefore okay to proceed with cycle #5 of weekly cisplatin today.  I will dose reduce her cisplatin to 30 mg/m due to prior cytopenias.  I will see her back in 1 week's time with labs prior to cycle #6 of weekly cisplatin.  She does tend to get dehydrated.  I will therefore arrange for 2 L of IV fluids on Monday  Ongoing weight loss: Patient does not have any evidence of severe mucositis or pain.  She will continue to work on her oral intake.  I will hold off on a feeding tube at this time    Visit Diagnosis 1. Squamous cell carcinoma of oropharynx ([place])   2. Encounter for antineoplastic chemotherapy   3. Chemotherapy-induced thrombocytopenia   4. Chemotherapy induced neutropenia ([facility])   5. Abnormal weight loss      Dr. Randa Evens, MD, MPH [facility] at [facility] Pager- [phone] 08/22/2017 8:03 AM

## 2017-08-23 ENCOUNTER — Ambulatory Visit
Admission: RE | Admit: 2017-08-23 | Discharge: 2017-08-23 | Disposition: A | Discharge status: Home / Self Care | Payer: BLUE CROSS/BLUE SHIELD | Source: Ambulatory Visit | Attending: Radiation Oncology | Admitting: Radiation Oncology

## 2017-08-23 DIAGNOSIS — C109 Malignant neoplasm of oropharynx, unspecified: Secondary | ICD-10-CM | POA: Diagnosis not present

## 2017-08-23 DIAGNOSIS — C01 Malignant neoplasm of base of tongue: Secondary | ICD-10-CM | POA: Diagnosis not present

## 2017-08-23 DIAGNOSIS — Z510 Encounter for antineoplastic radiation therapy: Secondary | ICD-10-CM | POA: Diagnosis not present

## 2017-08-24 ENCOUNTER — Ambulatory Visit
Admission: RE | Admit: 2017-08-24 | Discharge: 2017-08-24 | Disposition: A | Discharge status: Home / Self Care | Payer: BLUE CROSS/BLUE SHIELD | Source: Ambulatory Visit | Attending: Radiation Oncology | Admitting: Radiation Oncology

## 2017-08-24 DIAGNOSIS — C109 Malignant neoplasm of oropharynx, unspecified: Secondary | ICD-10-CM | POA: Diagnosis not present

## 2017-08-24 DIAGNOSIS — C01 Malignant neoplasm of base of tongue: Secondary | ICD-10-CM | POA: Diagnosis not present

## 2017-08-24 DIAGNOSIS — Z510 Encounter for antineoplastic radiation therapy: Secondary | ICD-10-CM | POA: Diagnosis not present

## 2017-08-25 ENCOUNTER — Ambulatory Visit
Admission: RE | Admit: 2017-08-25 | Discharge: 2017-08-25 | Disposition: A | Discharge status: Home / Self Care | Payer: BLUE CROSS/BLUE SHIELD | Source: Ambulatory Visit | Attending: Radiation Oncology | Admitting: Radiation Oncology

## 2017-08-25 ENCOUNTER — Inpatient Hospital Stay: Payer: BLUE CROSS/BLUE SHIELD

## 2017-08-25 ENCOUNTER — Ambulatory Visit: Payer: BLUE CROSS/BLUE SHIELD

## 2017-08-25 DIAGNOSIS — C01 Malignant neoplasm of base of tongue: Secondary | ICD-10-CM | POA: Diagnosis not present

## 2017-08-25 DIAGNOSIS — C109 Malignant neoplasm of oropharynx, unspecified: Secondary | ICD-10-CM | POA: Diagnosis not present

## 2017-08-25 DIAGNOSIS — Z510 Encounter for antineoplastic radiation therapy: Secondary | ICD-10-CM | POA: Diagnosis not present

## 2017-08-25 NOTE — Progress Notes (Signed)
Nutrition Follow-up:  Patient seen following radiation therapy appointment today.  Patient preferred that we talk in lobby area vs in consult room as foot was hurting her.  Noted patient received 1 liter of IV fluids on Monday. Has resumed radiation therapy and chemotherapy.  Due for chemotherapy tomorrow.    Patient reports appetite is little bit better.  Reports that she is drinking [employer] shake 1.0, 2 per day.  Reports that she has been eating oatmeal with egg and 1 egg white, sausage or bacon.  Reports that she has been eating egg drop soup for lunch, sometimes will eat peanut butter crackers.  Tried egg salad but that did not work. Reports some nausea and has been trying to take medication on more regular basis.  Thrush and congestion is better.     Medications: reviewed  Labs: reviewed  Anthropometrics:   Weight has decreased to 163 lb on 2/8 from initial weight of 177 lb on 12/11.   NUTRITION DIAGNOSIS: predicted suboptimal energy intake continues   MALNUTRITION DIAGNOSIS: severe malnutrition conitnues   INTERVENTION:   Noted per MD note not planning feeding tube at this time.  Patient currently not meeting nutritional needs. Discussed trying recipes that were given to patient at past visit utilizing [facility] shakes to provide more calories and protein to maintain weight during treatment.  Patient having issues ordering more [employer] product and RD will reach out to home health agency contact person.   Encouraged patient to eat every 2-3 hours to provide adequate calories and protein.     MONITORING, EVALUATION, GOAL: weight trends, intake   NEXT VISIT: Feb 21 following radiation  Blu Mcglaun B. [name], [place], [place] Registered Dietitian [phone] (pager)

## 2017-08-26 ENCOUNTER — Inpatient Hospital Stay: Payer: BLUE CROSS/BLUE SHIELD

## 2017-08-26 ENCOUNTER — Encounter: Payer: Self-pay | Admitting: Oncology

## 2017-08-26 ENCOUNTER — Ambulatory Visit: Payer: BLUE CROSS/BLUE SHIELD

## 2017-08-26 ENCOUNTER — Inpatient Hospital Stay (HOSPITAL_BASED_OUTPATIENT_CLINIC_OR_DEPARTMENT_OTHER): Payer: BLUE CROSS/BLUE SHIELD | Admitting: Oncology

## 2017-08-26 ENCOUNTER — Ambulatory Visit
Admission: RE | Admit: 2017-08-26 | Discharge: 2017-08-26 | Disposition: A | Discharge status: Home / Self Care | Payer: BLUE CROSS/BLUE SHIELD | Source: Ambulatory Visit | Attending: Radiation Oncology | Admitting: Radiation Oncology

## 2017-08-26 VITALS — BP 120/72 | HR 87 | Temp 97.4°F | Ht 64.0 in | Wt 160.9 lb

## 2017-08-26 DIAGNOSIS — R634 Abnormal weight loss: Secondary | ICD-10-CM

## 2017-08-26 DIAGNOSIS — B379 Candidiasis, unspecified: Secondary | ICD-10-CM | POA: Diagnosis not present

## 2017-08-26 DIAGNOSIS — F329 Major depressive disorder, single episode, unspecified: Secondary | ICD-10-CM

## 2017-08-26 DIAGNOSIS — E709 Disorder of aromatic amino-acid metabolism, unspecified: Secondary | ICD-10-CM

## 2017-08-26 DIAGNOSIS — Z7689 Persons encountering health services in other specified circumstances: Secondary | ICD-10-CM | POA: Diagnosis not present

## 2017-08-26 DIAGNOSIS — C109 Malignant neoplasm of oropharynx, unspecified: Secondary | ICD-10-CM

## 2017-08-26 DIAGNOSIS — E860 Dehydration: Secondary | ICD-10-CM

## 2017-08-26 DIAGNOSIS — R05 Cough: Secondary | ICD-10-CM | POA: Diagnosis not present

## 2017-08-26 DIAGNOSIS — D709 Neutropenia, unspecified: Secondary | ICD-10-CM | POA: Diagnosis not present

## 2017-08-26 DIAGNOSIS — C760 Malignant neoplasm of head, face and neck: Secondary | ICD-10-CM

## 2017-08-26 DIAGNOSIS — C029 Malignant neoplasm of tongue, unspecified: Secondary | ICD-10-CM

## 2017-08-26 DIAGNOSIS — I714 Abdominal aortic aneurysm, without rupture: Secondary | ICD-10-CM

## 2017-08-26 DIAGNOSIS — M79671 Pain in right foot: Secondary | ICD-10-CM

## 2017-08-26 DIAGNOSIS — Z7982 Long term (current) use of aspirin: Secondary | ICD-10-CM

## 2017-08-26 DIAGNOSIS — Z79899 Other long term (current) drug therapy: Secondary | ICD-10-CM

## 2017-08-26 DIAGNOSIS — Z803 Family history of malignant neoplasm of breast: Secondary | ICD-10-CM

## 2017-08-26 DIAGNOSIS — C01 Malignant neoplasm of base of tongue: Secondary | ICD-10-CM | POA: Diagnosis not present

## 2017-08-26 DIAGNOSIS — R630 Anorexia: Secondary | ICD-10-CM | POA: Diagnosis not present

## 2017-08-26 DIAGNOSIS — D696 Thrombocytopenia, unspecified: Secondary | ICD-10-CM | POA: Diagnosis not present

## 2017-08-26 DIAGNOSIS — R5383 Other fatigue: Secondary | ICD-10-CM

## 2017-08-26 DIAGNOSIS — I951 Orthostatic hypotension: Secondary | ICD-10-CM | POA: Diagnosis not present

## 2017-08-26 DIAGNOSIS — R079 Chest pain, unspecified: Secondary | ICD-10-CM

## 2017-08-26 DIAGNOSIS — Z9049 Acquired absence of other specified parts of digestive tract: Secondary | ICD-10-CM

## 2017-08-26 DIAGNOSIS — Z510 Encounter for antineoplastic radiation therapy: Secondary | ICD-10-CM | POA: Diagnosis not present

## 2017-08-26 DIAGNOSIS — R609 Edema, unspecified: Secondary | ICD-10-CM | POA: Diagnosis not present

## 2017-08-26 DIAGNOSIS — J029 Acute pharyngitis, unspecified: Secondary | ICD-10-CM

## 2017-08-26 DIAGNOSIS — Z5111 Encounter for antineoplastic chemotherapy: Secondary | ICD-10-CM | POA: Diagnosis not present

## 2017-08-26 DIAGNOSIS — K219 Gastro-esophageal reflux disease without esophagitis: Secondary | ICD-10-CM

## 2017-08-26 DIAGNOSIS — H9201 Otalgia, right ear: Secondary | ICD-10-CM | POA: Diagnosis not present

## 2017-08-26 DIAGNOSIS — R4701 Aphasia: Secondary | ICD-10-CM

## 2017-08-26 DIAGNOSIS — F419 Anxiety disorder, unspecified: Secondary | ICD-10-CM

## 2017-08-26 DIAGNOSIS — R0981 Nasal congestion: Secondary | ICD-10-CM

## 2017-08-26 DIAGNOSIS — R110 Nausea: Secondary | ICD-10-CM | POA: Diagnosis not present

## 2017-08-26 LAB — COMPREHENSIVE METABOLIC PANEL
ALT: 26 U/L (ref 14–54)
AST: 21 U/L (ref 15–41)
Albumin: 3.2 g/dL — ABNORMAL LOW (ref 3.5–5.0)
Alkaline Phosphatase: 68 U/L (ref 38–126)
Anion gap: 7 (ref 5–15)
BUN: 17 mg/dL (ref 6–20)
CHLORIDE: 107 mmol/L (ref 101–111)
CO2: 26 mmol/L (ref 22–32)
CREATININE: 0.73 mg/dL (ref 0.44–1.00)
Calcium: 8.6 mg/dL — ABNORMAL LOW (ref 8.9–10.3)
Glucose, Bld: 114 mg/dL — ABNORMAL HIGH (ref 65–99)
POTASSIUM: 3.9 mmol/L (ref 3.5–5.1)
SODIUM: 140 mmol/L (ref 135–145)
Total Bilirubin: 0.7 mg/dL (ref 0.3–1.2)
Total Protein: 5.7 g/dL — ABNORMAL LOW (ref 6.5–8.1)

## 2017-08-26 LAB — CBC WITH DIFFERENTIAL/PLATELET
BASOS ABS: 0 10*3/uL (ref 0–0.1)
Basophils Relative: 0 %
EOS ABS: 0 10*3/uL (ref 0–0.7)
Eosinophils Relative: 0 %
HCT: 29.6 % — ABNORMAL LOW (ref 35.0–47.0)
HEMOGLOBIN: 10.4 g/dL — AB (ref 12.0–16.0)
LYMPHS ABS: 0.3 10*3/uL — AB (ref 1.0–3.6)
LYMPHS PCT: 3 %
MCH: 32.5 pg (ref 26.0–34.0)
MCHC: 35.2 g/dL (ref 32.0–36.0)
MCV: 92.2 fL (ref 80.0–100.0)
Monocytes Absolute: 1 10*3/uL — ABNORMAL HIGH (ref 0.2–0.9)
Monocytes Relative: 12 %
NEUTROS PCT: 85 %
Neutro Abs: 7.1 10*3/uL — ABNORMAL HIGH (ref 1.4–6.5)
PLATELETS: 194 10*3/uL (ref 150–440)
RBC: 3.21 MIL/uL — AB (ref 3.80–5.20)
RDW: 15.9 % — ABNORMAL HIGH (ref 11.5–14.5)
WBC: 8.4 10*3/uL (ref 3.6–11.0)

## 2017-08-26 LAB — MAGNESIUM: MAGNESIUM: 2.1 mg/dL (ref 1.7–2.4)

## 2017-08-26 MED ORDER — LIDOCAINE-PRILOCAINE 2.5-2.5 % EX CREA: TOPICAL_CREAM | CUTANEOUS | 3 refills | Status: DC

## 2017-08-26 MED ORDER — FLUCONAZOLE 100 MG PO TABS: 100.0000 mg | ORAL_TABLET | Freq: Every day | ORAL | 0 refills | Status: DC

## 2017-08-26 MED ORDER — SODIUM CHLORIDE 0.9 % IV SOLN
Freq: Once | INTRAVENOUS | Status: AC
  Administered 2017-08-26: 13:00:00 via INTRAVENOUS
  Filled 2017-08-26: qty 5

## 2017-08-26 MED ORDER — SODIUM CHLORIDE 0.9 % IV SOLN
Freq: Once | INTRAVENOUS | Status: AC
  Administered 2017-08-26: 11:00:00 via INTRAVENOUS
  Filled 2017-08-26: qty 1000

## 2017-08-26 MED ORDER — PALONOSETRON HCL INJECTION 0.25 MG/5ML
0.2500 mg | Freq: Once | INTRAVENOUS | Status: AC
  Administered 2017-08-26: 0.25 mg via INTRAVENOUS
  Filled 2017-08-26: qty 5

## 2017-08-26 MED ORDER — SODIUM CHLORIDE 0.9 % IV SOLN
40.0000 mg/m2 | Freq: Once | INTRAVENOUS | Status: AC
  Administered 2017-08-26: 76 mg via INTRAVENOUS
  Filled 2017-08-26: qty 76

## 2017-08-26 MED ORDER — HEPARIN SOD (PORK) LOCK FLUSH 100 UNIT/ML IV SOLN
500.0000 [IU] | Freq: Once | INTRAVENOUS | Status: AC | PRN
  Administered 2017-08-26: 500 [IU]
  Filled 2017-08-26: qty 5

## 2017-08-26 MED ORDER — POTASSIUM CHLORIDE 2 MEQ/ML IV SOLN
Freq: Once | INTRAVENOUS | Status: AC
  Administered 2017-08-26: 11:00:00 via INTRAVENOUS
  Filled 2017-08-26: qty 1000

## 2017-08-26 NOTE — Progress Notes (Signed)
No new changes today. 

## 2017-08-26 NOTE — Progress Notes (Signed)
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 Consuela Mimes evaluation of right neck mass. The mass has been present for or a year and has been growing gradually. It was associated with some right ear pain but no fever. Patient has been a never smoker.Ultrasound on 05/30/2017 showed:Single enlarged lymph node in the right neck measures approximately 4.8 x 1.7 x 3.0 cm  2.Patient underwent IR guided lymph node biopsy of the cervical lymph node which showed metastatic squamous cell carcinoma. Staining for P 16 was positive and sox 10 was negative   3.PET/CT scan on 06/09/2017 showed intensely hypermetabolic enlarged right level 2 cervical lymph node with an SUV of 11.1. No clear site of primary malignancy within the head and neck. Potential site of investigation would include right base of tongue. There was asymmetric metabolic activity noted in that area with an SUV of 4.5. Less suspicious region in the posterior glottis. No evidence of distant metastatic disease.  4.Patient works as a Geophysicist/field seismologist at [employer]. Patient has had multiple allergies to medications in the past. Reports that when she took steroids in the past it made her quite aggravated. Her sister was hospitalized in a mental health facility after receiving a steroid course in the past.  5. Right tongue base biopsy positive for SCC. Plan is  to proceed with concurrent chemo/RT with cisplatin/RT. Baseline audiogram normal   Interval history- she reports having pain in her right heel since 1 day. She has taken 4 doses of ibuprofen so far and reports feeling 50% better. She thinks she may be coming down with some yeast infection. Denies any pain. She is able to eat a normal breakfast but mainly does protein shakes   ECOG PS- 0 Pain scale- 0  Review of systems- Review of Systems  Constitutional: Positive for malaise/fatigue. Negative for chills, fever and weight loss.  HENT: Negative for congestion, ear discharge and nosebleeds.   Eyes: Negative for blurred vision.  Respiratory: Negative for cough, hemoptysis, sputum production, shortness of breath and wheezing.   Cardiovascular: Negative for chest pain, palpitations, orthopnea and claudication.  Gastrointestinal: Negative for abdominal pain, blood in stool, constipation, diarrhea, heartburn, melena, nausea and vomiting.  Genitourinary: Negative for dysuria, flank pain, frequency, hematuria and urgency.  Musculoskeletal: Negative for back pain, joint pain and myalgias.       Right heel pain  Skin: Negative for rash.  Neurological: Negative for dizziness, tingling, focal weakness, seizures, weakness and headaches.  Endo/Heme/Allergies: Does not bruise/bleed easily.  Psychiatric/Behavioral: Negative for depression and suicidal ideas. The patient does not have insomnia.       Allergies  Allergen Reactions  . Cefdinir Swelling    Swelling in mouth   . Cephalosporins Swelling  . Dairy Aid [Lactase] Swelling    Milk and milk products cause swelling and tingling of tongue  . Flagyl [Metronidazole] Swelling    Mouth and throat and ears go red and burn  . Lactose Intolerance (Gi) Other (See Comments)  GI upset.  Eats only gluten free  . Penicillins Rash    Rash/hives  . Phenylephrine-Guaifenesin Anaphylaxis    Night terrors  . Sulfonamide Derivatives Rash    Rash/itching.   From head to her toes, the itching was horrible. Penicillin is the worst  . Tape Rash    Adhesives all cause a problem severely.  It doesn't matter if it is paper tape or not.  Marland Kitchen Dexilant [Dexlansoprazole]     Pressure in head like head is in a vice and being squeezed  . Entex Lq [Phenylephrine-Guaifenesin]     Night terrors  . Other Other (See Comments)    Flu vaccine:  Weakness, dizziness, tia type symptoms.    . Oxycodone Nausea And Vomiting    And itching  . Succinylcholine Other (See Comments)    Trouble waking up  . Codeine Nausea And Vomiting  . Guaifenesin & Derivatives Other (See Comments)    Night terrors  . Lactobacillus Itching  . Naproxen Sodium     Itching/rash  . Pseudoephedrine Rash  . Sulfa Antibiotics Rash  . Tetracyclines & Related Other (See Comments)    Was taking flagyl at the same time; unsure which caused the swelling  Swelling of throat. Pharmacist states that he believes it was the flagyl and not the tetracycline  . Wheat Bran Other (See Comments)    Upset stomach. Now eats gluten free     Past Medical History:  Diagnosis Date  . AA (aortic aneurysm) ([place])    a. 04/2011 Echo: Ao Root: 4.1cm, Asc Ao 4.7cm.  . Anxiety   . AV block, 2nd degree    a. 05/2005 - s/p MDT Adapta ADDR01 Dual Chamber PPM  . Bicuspid aortic valve   . Cancer ([place])   . Chest pain    a. 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  . Depression   . Expressive aphasia    a. ongoing since 04/2011 - seen by neurology - ? TIA vs. Migraine  . Facial numbness    a. ongoing since 04/2011 - seen by neurology - ? TIA vs. Migraine  . GERD (gastroesophageal reflux disease)   . Low blood pressure   . Presence of permanent cardiac pacemaker   . 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 Syncope    a. 04/2011 Echo: EF 55-65%, No RWMA, Gr 1 DD.     Past Surgical History:  Procedure Laterality Date  . ABDOMINAL WALL MESH  REMOVAL    . bladder tack    . cataract Bilateral  2004  . CHOLECYSTECTOMY  2000  . DILATION AND CURETTAGE OF UTERUS    . DIRECT LARYNGOSCOPY Right 06/16/2017   Procedure: MICRO DIRECT LARYNGOSCOPY WITH BIOPSY OF RIGHT BASE OF TONGUE;  Surgeon: Carloyn Manner, MD;  Location: [facility];  Service: ENT;  Laterality: Right;  . EP IMPLANTABLE DEVICE N/A 06/04/2015   Procedure: PPM Generator Changeout;  Surgeon: Deboraha Sprang, MD;  Location: [place] [place] [facility];  Service: Cardiovascular;  Laterality: N/A;  . ESOPHAGOGASTRODUODENOSCOPY (EGD) WITH PROPOFOL N/A 12/18/2015   Procedure: ESOPHAGOGASTRODUODENOSCOPY (EGD) WITH gastric biopsy and dilation;  Surgeon: Lucilla Lame, MD;  Location: [facility];  Service: Endoscopy;  Laterality: N/A;  . EYE SURGERY    . INSERT / REPLACE / REMOVE PACEMAKER    . IR FLUORO GUIDE PORT INSERTION RIGHT  06/22/2017  . PACEMAKER INSERTION  2006   [employer] Adapta ADDR01  . TONSILLECTOMY    . TONSILLECTOMY    .  VAGINAL HYSTERECTOMY  2013   mad/cope    Social History   Socioeconomic History  . Marital status: Married    Spouse name: Not on file  . Number of children: Not on file  . Years of education: Not on file  . Highest education level: Not on file  Social Needs  . Financial resource strain: Not on file  . Food insecurity - worry: Not on file  . Food insecurity - inability: Not on file  . Transportation needs - medical: Not on file  . Transportation needs - non-medical: Not on file  Occupational History  . Not on file  Tobacco Use  . Smoking status: Never Smoker  . Smokeless tobacco: Never Used  . Tobacco comment: tobacco use -no  Substance and Sexual Activity  . Alcohol use: No  . Drug use: No  . Sexual activity: Yes    Birth control/protection: Surgical  Other Topics Concern  . Not on file  Social History Narrative   Lives in [place] with husband.  Works out regularly.  Works as a Geophysicist/field seismologist - owns own business.     Family History  Problem Relation Age of Onset  .  COPD Mother        alive @ 40  . Atrial fibrillation Mother   . Stroke Father        died @ 81  . Breast cancer Maternal Aunt 70  . COPD Maternal Grandmother   . Ovarian cancer Neg Hx   . Diabetes Neg Hx      Current Outpatient Medications:  .  aspirin 81 MG tablet, Take 81 mg by mouth daily., Disp: , Rfl:  .  b complex vitamins tablet, Take 1 tablet by mouth daily., Disp: , Rfl:  .  BLACK ELDERBERRY,BERRY-FLOWER, PO, Take 1 capsule by mouth daily., Disp: , Rfl:  .  CRANBERRY SOFT PO, Take 2 each by mouth daily. , Disp: , Rfl:  .  dexamethasone (DECADRON) 4 MG tablet, Take 2 tablets by mouth once a day on the day after chemotherapy and then take 2 tablets two times a day for 2 days. Take with food., Disp: 30 tablet, Rfl: 1 .  Digestive Enzymes (DIGESTIVE ENZYME PO), Take 1-2 capsules by mouth daily., Disp: , Rfl:  .  fluticasone (FLONASE SENSIMIST) 27.5 MCG/SPRAY nasal spray, Place 1 spray into the nose daily as needed. , Disp: , Rfl:  .  lidocaine-prilocaine (EMLA) cream, Apply to affected area once, Disp: 30 g, Rfl: 3 .  LORazepam (ATIVAN) 0.5 MG tablet, Take 1 tablet (0.5 mg total) by mouth every 6 (six) hours as needed (Nausea or vomiting)., Disp: 30 tablet, Rfl: 0 .  NON FORMULARY, Take by mouth daily. Bone Up (calcium) , Disp: , Rfl:  .  ondansetron (ZOFRAN) 4 MG tablet, Take 1 tablet (4 mg total) by mouth every 8 (eight) hours as needed for up to 10 doses for nausea or vomiting., Disp: 20 tablet, Rfl: 0 .  ondansetron (ZOFRAN) 8 MG tablet, Take 1 tablet (8 mg total) by mouth 2 (two) times daily as needed. Start on the third day after chemotherapy., Disp: 30 tablet, Rfl: 1 .  prochlorperazine (COMPAZINE) 10 MG tablet, Take 1 tablet (10 mg total) by mouth every 6 (six) hours as needed (Nausea or vomiting)., Disp: 30 tablet, Rfl: 1 .  sucralfate (CARAFATE) 1 g tablet, Take 1 tablet (1 g total) by mouth 3 (three) times daily. Dissolve in 2-3 tbsp warm water, swish and swallow.,  Disp: 90  tablet, Rfl: 3 .  fexofenadine (ALLEGRA) 60 MG tablet, Take 60 mg by mouth daily as needed. , Disp: , Rfl:  .  fluconazole (DIFLUCAN) 100 MG tablet, Take 1 tablet (100 mg total) by mouth daily., Disp: 5 tablet, Rfl: 0 .  HYDROcodone-acetaminophen (HYCET) 7.5-325 mg/15 ml solution, Take 10 mLs by mouth 4 (four) times daily as needed for moderate pain. (Patient not taking: Reported on 08/26/2017), Disp: 300 mL, Rfl: 0 .  loratadine (CLARITIN) 10 MG tablet, Take 10 mg by mouth daily as needed. , Disp: , Rfl:  .  magic mouthwash SOLN, Take 5 mLs by mouth 4 (four) times daily as needed for mouth pain. Swish and swallow (Patient not taking: Reported on 08/19/2017), Disp: 420 mL, Rfl: 1 .  nitroGLYCERIN (NITROSTAT) 0.4 MG SL tablet, Place 1 tablet (0.4 mg total) under the tongue every 5 (five) minutes as needed for chest pain. (Patient not taking: Reported on 08/26/2017), Disp: 25 tablet, Rfl: 3 .  Polyethyl Glycol-Propyl Glycol (SYSTANE) 0.4-0.3 % GEL ophthalmic gel, Place 1 application into both eyes daily as needed., Disp: , Rfl:  .  PRESCRIPTION MEDICATION, Place 1 tablet vaginally 3 (three) times a week. E3 1mg  and Testosterone 1mg  compound, Disp: , Rfl:  No current facility-administered medications for this visit.   Facility-Administered Medications Ordered in Other Visits:  .  0.9 %  sodium chloride infusion, , Intravenous, Once, Sindy Guadeloupe, MD .  CISplatin (PLATINOL) 76 mg in sodium chloride 0.9 % 250 mL chemo infusion, 40 mg/m2 (Treatment Plan Recorded), Intravenous, Once, Sindy Guadeloupe, MD .  fosaprepitant (EMEND) 150 mg, dexamethasone (DECADRON) 12 mg in sodium chloride 0.9 % 145 mL IVPB, , Intravenous, Once, Sindy Guadeloupe, MD .  heparin lock flush 100 unit/mL, 500 Units, Intracatheter, Once PRN, Sindy Guadeloupe, MD .  palonosetron (ALOXI) injection 0.25 mg, 0.25 mg, Intravenous, Once, Sindy Guadeloupe, MD  Physical exam:  Vitals:   08/26/17 0922  BP: 120/72  Pulse: 87  Temp: (!) 97.4 F  (36.3 C)  TempSrc: Tympanic  Weight: 160 lb 14.4 oz (73 kg)  Height: 5\' 4"  (1.626 m)   Physical Exam  Constitutional: She is oriented to person, place, and time and well-developed, well-nourished, and in no distress.  HENT:  Head: Normocephalic and atraumatic.  Mouth/Throat: Oropharynx is clear and moist.  Eyes: EOM are normal. Pupils are equal, round, and reactive to light.  Neck: Normal range of motion.  Cardiovascular: Normal rate, regular rhythm and normal heart sounds.  Pulmonary/Chest: Effort normal and breath sounds normal.  Abdominal: Soft. Bowel sounds are normal.  Lymphadenopathy:  No palpable adenopathy  Neurological: She is alert and oriented to person, place, and time.  Skin: Skin is warm and dry.     CMP Latest Ref Rng & Units 08/26/2017  Glucose 65 - 99 mg/dL 114(H)  BUN 6 - 20 mg/dL 17  Creatinine 0.44 - 1.00 mg/dL 0.73  Sodium 135 - 145 mmol/L 140  Potassium 3.5 - 5.1 mmol/L 3.9  Chloride 101 - 111 mmol/L 107  CO2 22 - 32 mmol/L 26  Calcium 8.9 - 10.3 mg/dL 8.6(L)  Total Protein 6.5 - 8.1 g/dL 5.7(L)  Total Bilirubin 0.3 - 1.2 mg/dL 0.7  Alkaline Phos 38 - 126 U/L 68  AST 15 - 41 U/L 21  ALT 14 - 54 U/L 26   CBC Latest Ref Rng & Units 08/26/2017  WBC 3.6 - 11.0 K/uL 8.4  Hemoglobin 12.0 - 16.0  g/dL 10.4(L)  Hematocrit 35.0 - 47.0 % 29.6(L)  Platelets 150 - 440 K/uL 194    No images are attached to the encounter.  Dg Chest 2 View  Result Date: 08/07/2017 CLINICAL DATA:  Productive cough and fever. Oropharyngeal squamous 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 therapy. EXAM: CHEST  2 VIEW COMPARISON:  None. FINDINGS: The heart size and mediastinal contours are within normal limits. 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 Right-sided Port-A-Cath also in expected position. No pneumothorax identified. Both lungs are clear. The visualized skeletal structures are unremarkable. IMPRESSION: No active cardiopulmonary disease.  Electronically Signed   By: Earle Gell M.D.   On: 08/07/2017 20:58     Assessment and plan- Patient is a 62 y.o. 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  Patient finishes her radiation on 09/05/2017 which is 9 days from now.  This would therefore be her last dose of chemotherapy.  I will increase her dose of cisplatin to 40 mg/m square given that this is her last dose and even if she develops cytopenia she would eventually recover her bone marrow in the next few weeks.  Right heel pain possibly secondary to tendinitis from Levaquin.  I have asked her not to take more than 1-2 doses of Motrin.  I have suggested that she should take 2 extra strength Tylenol thousand milligrams no more than 3 g/day instead to avoid NSAID induced gastritis  I will see her back in 1 week's time with a CBC and BMP for possible IV fluids  Possible yeast infection: I have given her a prescription of Diflucan that she can take if she were to develop any overt vaginal discharge or symptoms of yeast infection    Visit Diagnosis 1. Encounter for antineoplastic chemotherapy   2. Squamous cell carcinoma of head and neck (HCC)   3. Pain of right heel      Dr. Randa Evens, MD, MPH [place] [facility] Pager- [phone] 08/26/2017 11:43 AM

## 2017-08-26 NOTE — Progress Notes (Signed)
Verified with MD, going back up on cisplatin dose since this is patients last dose. Up to 40mg /m2

## 2017-08-29 ENCOUNTER — Other Ambulatory Visit: Payer: Self-pay | Admitting: *Deleted

## 2017-08-29 ENCOUNTER — Ambulatory Visit: Payer: BLUE CROSS/BLUE SHIELD

## 2017-08-29 ENCOUNTER — Ambulatory Visit
Admission: RE | Admit: 2017-08-29 | Discharge: 2017-08-29 | Disposition: A | Discharge status: Home / Self Care | Payer: BLUE CROSS/BLUE SHIELD | Source: Ambulatory Visit | Attending: Radiation Oncology | Admitting: Radiation Oncology

## 2017-08-29 DIAGNOSIS — Z510 Encounter for antineoplastic radiation therapy: Secondary | ICD-10-CM | POA: Diagnosis not present

## 2017-08-29 DIAGNOSIS — C760 Malignant neoplasm of head, face and neck: Secondary | ICD-10-CM

## 2017-08-29 DIAGNOSIS — C01 Malignant neoplasm of base of tongue: Secondary | ICD-10-CM | POA: Diagnosis not present

## 2017-08-29 DIAGNOSIS — C109 Malignant neoplasm of oropharynx, unspecified: Secondary | ICD-10-CM | POA: Diagnosis not present

## 2017-08-29 MED ORDER — PROCHLORPERAZINE MALEATE 10 MG PO TABS: 10.0000 mg | ORAL_TABLET | Freq: Four times a day (QID) | ORAL | 1 refills | Status: DC | PRN

## 2017-08-30 ENCOUNTER — Telehealth: Payer: Self-pay | Admitting: *Deleted

## 2017-08-30 ENCOUNTER — Other Ambulatory Visit: Payer: Self-pay | Admitting: Oncology

## 2017-08-30 ENCOUNTER — Ambulatory Visit: Payer: BLUE CROSS/BLUE SHIELD

## 2017-08-30 ENCOUNTER — Ambulatory Visit
Admission: RE | Admit: 2017-08-30 | Discharge: 2017-08-30 | Disposition: A | Discharge status: Home / Self Care | Payer: BLUE CROSS/BLUE SHIELD | Source: Ambulatory Visit | Attending: Radiation Oncology | Admitting: Radiation Oncology

## 2017-08-30 ENCOUNTER — Telehealth: Payer: Self-pay

## 2017-08-30 DIAGNOSIS — C760 Malignant neoplasm of head, face and neck: Secondary | ICD-10-CM

## 2017-08-30 DIAGNOSIS — Z510 Encounter for antineoplastic radiation therapy: Secondary | ICD-10-CM | POA: Diagnosis not present

## 2017-08-30 DIAGNOSIS — C01 Malignant neoplasm of base of tongue: Secondary | ICD-10-CM | POA: Diagnosis not present

## 2017-08-30 DIAGNOSIS — C109 Malignant neoplasm of oropharynx, unspecified: Secondary | ICD-10-CM | POA: Diagnosis not present

## 2017-08-30 NOTE — Telephone Encounter (Signed)
Patient reports it has gotten worse since yesterday and that she will call back tomorrow if not improved. She asked what she could do differently and I advised she take her nausea medicine 30 - 40 mins prior to eating and to do good oral hygiene and to rinse her mouth with baking soda water

## 2017-08-30 NOTE — Telephone Encounter (Signed)
Nutrition Follow-up:  Patient called RD having issues with gagging and unable to take in much liquids or soft solids.  Reports that everything she tries to eat, drink she gags.  Reports thick saliva (ropey) and dry mouth.  Has been rinsing with biotene.  Reports that she has been taking nausea medication as prescribed (zofran, compazine)  NUTRITION DIAGNOSIS: Predicted suboptimal energy intake continues   MALNUTRITION DIAGNOSIS: severe malnutrition continues   INTERVENTION:   Encouraged patient to swish, gargle and spit out lemon-lime soda to help with thick saliva (likely contributing to gagging).  Discussed additional strategies to help manage thick saliva.   Encouraged patient to contact Medical Oncology for additional support (ie if needs fluids if low po intake?? Or medication regimen). RD currently at another facility today.      MONITORING, EVALUATION, GOAL: weight trends, intake   NEXT VISIT: Feb 21 after radiation therapy.   Geetika Laborde B. [name], [place], [place] Registered Dietitian [phone] (pager)

## 2017-08-30 NOTE — Telephone Encounter (Signed)
If this has just been happening since 1 day, she could wait and see if it gets better. If symptoms dont improve we can see her in the next day or 2.

## 2017-08-30 NOTE — Telephone Encounter (Signed)
Patient called and states Wendy Kelly is having gagging when she tries to eat or drink, she is not vomiting, just gagging when she tries to swallow. She has spoken with Gladstone Pih the dietician who recommended she call us. Please advise

## 2017-08-31 ENCOUNTER — Ambulatory Visit: Payer: BLUE CROSS/BLUE SHIELD

## 2017-08-31 ENCOUNTER — Inpatient Hospital Stay: Payer: BLUE CROSS/BLUE SHIELD

## 2017-08-31 ENCOUNTER — Inpatient Hospital Stay (HOSPITAL_BASED_OUTPATIENT_CLINIC_OR_DEPARTMENT_OTHER): Payer: BLUE CROSS/BLUE SHIELD | Admitting: Nurse Practitioner

## 2017-08-31 ENCOUNTER — Ambulatory Visit
Admission: RE | Admit: 2017-08-31 | Discharge: 2017-08-31 | Disposition: A | Discharge status: Home / Self Care | Payer: BLUE CROSS/BLUE SHIELD | Source: Ambulatory Visit | Attending: Radiation Oncology | Admitting: Radiation Oncology

## 2017-08-31 ENCOUNTER — Telehealth: Payer: Self-pay | Admitting: *Deleted

## 2017-08-31 ENCOUNTER — Encounter: Payer: Self-pay | Admitting: Nurse Practitioner

## 2017-08-31 VITALS — BP 113/75 | HR 83

## 2017-08-31 VITALS — BP 88/66 | HR 105 | Temp 98.2°F | Resp 18 | Ht 64.0 in | Wt 158.6 lb

## 2017-08-31 DIAGNOSIS — R079 Chest pain, unspecified: Secondary | ICD-10-CM | POA: Diagnosis not present

## 2017-08-31 DIAGNOSIS — E860 Dehydration: Secondary | ICD-10-CM

## 2017-08-31 DIAGNOSIS — I951 Orthostatic hypotension: Secondary | ICD-10-CM

## 2017-08-31 DIAGNOSIS — T451X5A Adverse effect of antineoplastic and immunosuppressive drugs, initial encounter: Secondary | ICD-10-CM

## 2017-08-31 DIAGNOSIS — R110 Nausea: Secondary | ICD-10-CM | POA: Diagnosis not present

## 2017-08-31 DIAGNOSIS — Z5111 Encounter for antineoplastic chemotherapy: Secondary | ICD-10-CM | POA: Diagnosis not present

## 2017-08-31 DIAGNOSIS — F419 Anxiety disorder, unspecified: Secondary | ICD-10-CM

## 2017-08-31 DIAGNOSIS — Z79899 Other long term (current) drug therapy: Secondary | ICD-10-CM

## 2017-08-31 DIAGNOSIS — R4701 Aphasia: Secondary | ICD-10-CM

## 2017-08-31 DIAGNOSIS — R112 Nausea with vomiting, unspecified: Secondary | ICD-10-CM

## 2017-08-31 DIAGNOSIS — Z7689 Persons encountering health services in other specified circumstances: Secondary | ICD-10-CM | POA: Diagnosis not present

## 2017-08-31 DIAGNOSIS — M79671 Pain in right foot: Secondary | ICD-10-CM

## 2017-08-31 DIAGNOSIS — Z7982 Long term (current) use of aspirin: Secondary | ICD-10-CM

## 2017-08-31 DIAGNOSIS — Z5189 Encounter for other specified aftercare: Secondary | ICD-10-CM

## 2017-08-31 DIAGNOSIS — H9201 Otalgia, right ear: Secondary | ICD-10-CM | POA: Diagnosis not present

## 2017-08-31 DIAGNOSIS — R05 Cough: Secondary | ICD-10-CM | POA: Diagnosis not present

## 2017-08-31 DIAGNOSIS — R5383 Other fatigue: Secondary | ICD-10-CM | POA: Diagnosis not present

## 2017-08-31 DIAGNOSIS — I714 Abdominal aortic aneurysm, without rupture: Secondary | ICD-10-CM | POA: Diagnosis not present

## 2017-08-31 DIAGNOSIS — R630 Anorexia: Secondary | ICD-10-CM

## 2017-08-31 DIAGNOSIS — C109 Malignant neoplasm of oropharynx, unspecified: Secondary | ICD-10-CM

## 2017-08-31 DIAGNOSIS — F329 Major depressive disorder, single episode, unspecified: Secondary | ICD-10-CM | POA: Diagnosis not present

## 2017-08-31 DIAGNOSIS — K219 Gastro-esophageal reflux disease without esophagitis: Secondary | ICD-10-CM

## 2017-08-31 DIAGNOSIS — C01 Malignant neoplasm of base of tongue: Secondary | ICD-10-CM | POA: Diagnosis not present

## 2017-08-31 DIAGNOSIS — R634 Abnormal weight loss: Secondary | ICD-10-CM

## 2017-08-31 DIAGNOSIS — Z803 Family history of malignant neoplasm of breast: Secondary | ICD-10-CM

## 2017-08-31 DIAGNOSIS — D709 Neutropenia, unspecified: Secondary | ICD-10-CM | POA: Diagnosis not present

## 2017-08-31 DIAGNOSIS — B379 Candidiasis, unspecified: Secondary | ICD-10-CM | POA: Diagnosis not present

## 2017-08-31 DIAGNOSIS — R609 Edema, unspecified: Secondary | ICD-10-CM | POA: Diagnosis not present

## 2017-08-31 DIAGNOSIS — D696 Thrombocytopenia, unspecified: Secondary | ICD-10-CM | POA: Diagnosis not present

## 2017-08-31 DIAGNOSIS — Z510 Encounter for antineoplastic radiation therapy: Secondary | ICD-10-CM | POA: Diagnosis not present

## 2017-08-31 DIAGNOSIS — Z9049 Acquired absence of other specified parts of digestive tract: Secondary | ICD-10-CM

## 2017-08-31 LAB — CBC WITH DIFFERENTIAL/PLATELET
Basophils Absolute: 0 10*3/uL (ref 0–0.1)
Basophils Relative: 0 %
EOS ABS: 0 10*3/uL (ref 0–0.7)
EOS PCT: 0 %
HCT: 30.2 % — ABNORMAL LOW (ref 35.0–47.0)
Hemoglobin: 10.5 g/dL — ABNORMAL LOW (ref 12.0–16.0)
LYMPHS ABS: 0.3 10*3/uL — AB (ref 1.0–3.6)
LYMPHS PCT: 4 %
MCH: 32.3 pg (ref 26.0–34.0)
MCHC: 34.6 g/dL (ref 32.0–36.0)
MCV: 93.3 fL (ref 80.0–100.0)
MONO ABS: 1 10*3/uL — AB (ref 0.2–0.9)
MONOS PCT: 12 %
Neutro Abs: 6.8 10*3/uL — ABNORMAL HIGH (ref 1.4–6.5)
Neutrophils Relative %: 84 %
PLATELETS: 178 10*3/uL (ref 150–440)
RBC: 3.23 MIL/uL — ABNORMAL LOW (ref 3.80–5.20)
RDW: 16.7 % — ABNORMAL HIGH (ref 11.5–14.5)
WBC: 8.2 10*3/uL (ref 3.6–11.0)

## 2017-08-31 LAB — COMPREHENSIVE METABOLIC PANEL
ALBUMIN: 3.3 g/dL — AB (ref 3.5–5.0)
ALT: 19 U/L (ref 14–54)
AST: 19 U/L (ref 15–41)
Alkaline Phosphatase: 76 U/L (ref 38–126)
Anion gap: 6 (ref 5–15)
BUN: 16 mg/dL (ref 6–20)
CHLORIDE: 104 mmol/L (ref 101–111)
CO2: 27 mmol/L (ref 22–32)
CREATININE: 0.67 mg/dL (ref 0.44–1.00)
Calcium: 8.7 mg/dL — ABNORMAL LOW (ref 8.9–10.3)
GFR calc Af Amer: >60 mL/min (ref >60)
GFR calc non Af Amer: >60 mL/min (ref >60)
GLUCOSE: 102 mg/dL — AB (ref 65–99)
POTASSIUM: 4.1 mmol/L (ref 3.5–5.1)
SODIUM: 137 mmol/L (ref 135–145)
Total Bilirubin: 0.9 mg/dL (ref 0.3–1.2)
Total Protein: 5.9 g/dL — ABNORMAL LOW (ref 6.5–8.1)

## 2017-08-31 LAB — MAGNESIUM: MAGNESIUM: 2.1 mg/dL (ref 1.7–2.4)

## 2017-08-31 MED ORDER — SODIUM CHLORIDE 0.9 % IV SOLN: Freq: Once | INTRAVENOUS | Status: DC

## 2017-08-31 MED ORDER — ONDANSETRON HCL 4 MG/2ML IJ SOLN
8.0000 mg | Freq: Once | INTRAMUSCULAR | Status: AC
  Administered 2017-08-31: 8 mg via INTRAVENOUS
  Filled 2017-08-31: qty 4

## 2017-08-31 MED ORDER — HEPARIN SOD (PORK) LOCK FLUSH 100 UNIT/ML IV SOLN
500.0000 [IU] | Freq: Once | INTRAVENOUS | Status: AC
  Administered 2017-08-31: 500 [IU] via INTRAVENOUS

## 2017-08-31 MED ORDER — HEPARIN SOD (PORK) LOCK FLUSH 100 UNIT/ML IV SOLN
INTRAVENOUS | Status: AC
  Filled 2017-08-31: qty 5

## 2017-08-31 MED ORDER — SODIUM CHLORIDE 0.9 % IV SOLN
Freq: Once | INTRAVENOUS | Status: AC
Start: 2017-08-31 — End: 2017-08-31
  Administered 2017-08-31: 15:00:00 via INTRAVENOUS
  Filled 2017-08-31: qty 1000

## 2017-08-31 NOTE — Progress Notes (Signed)
Feels weak and dehydrated, has gagging sensation from mucus and trying to drink. Does not vomit. Now feeling nauseated.

## 2017-08-31 NOTE — Progress Notes (Signed)
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 Janese Banks, on 08/26/17.  Patient has now completed 6 cycles of weekly cisplatin, completing her chemotherapy.  She is scheduled to complete radiation on 09/05/17.  Cisplatin was dose increased to 40 mg/m given it was her last dose of chemotherapy. Patient was initially seen by Dr. Pryor Ochoa for evaluation of right neck mass which have been present for a year and growing gradually.  Had some ear pain but no fever.  Patient was never smoker.  Ultrasound on 05/30/17 showed single enlarged lymph node in the right neck measuring 4.8 x 1.7 x 3.0 cm.  Patient underwent IR guided lymph node biopsy of the cervical lymph node which showed metastatic squamous cell carcinoma staining for P 16 was positive and sox 10 was negative.  PET scan on 06/09/17 showed intensely hypermetabolic enlarged right level 2 cervical lymph node with SUV of 11.1.  Of note clear site of primary malignancy within the head or neck.  Potential site of investigation would include right base of tongue.  There is asymmetric metabolic activity noted in the area with SUV of 4.5.  Less suspicious region in the posterior glottis with no evidence of distant metastatic disease.  Biopsy of right tongue base positive for squamous cell carcinoma.  Plan was created to proceed with concurrent chemo/RT with cisplatin and RT. baseline audiogram was normal.  Complications from treatment-poor appetite  (followed by dietitian; drinking [employer] shakes), thrush, congestion, weight loss  Comorbidities complicating care: Multiple drug allergies, anxiety  Interval history-patient presents to symptom management clinic today for complaints of nausea and dry heaves.  Symptoms first presented approximately 1-2 days ago and have progressively worsened.  She states that her oral intake is quite poor.  She denies trouble swallowing feels that sometimes she will cough and things "want to come up". Symptoms progressively worsen throughout day. No symptoms at night. Feels symptoms are worsened with smells, movement, oral intake.  Has been taking Compazine without significant improvement.  Has not been taking Zofran.  States that symptoms significantly impair her quality of life. States that she thinks she needs IV fluids.   ECOG FS:1 - Symptomatic but completely ambulatory  Review of systems- Review of Systems  Constitutional: Positive for malaise/fatigue and weight loss. Negative for chills, diaphoresis and fever.  HENT: Negative.   Eyes: Negative.   Respiratory: Positive for cough and sputum production. Negative for hemoptysis, shortness of breath and wheezing.   Cardiovascular: Negative.   Gastrointestinal: Positive for nausea. Negative for abdominal pain, blood in stool, constipation, diarrhea, heartburn and vomiting.  Genitourinary: Negative.   Musculoskeletal: Negative.   Skin: Negative.   Neurological: Positive for weakness. Negative for dizziness, tingling, tremors, loss of consciousness and headaches.  Endo/Heme/Allergies: Does not bruise/bleed easily.  Psychiatric/Behavioral: The patient is nervous/anxious.      Current treatment- s/p weekly cisplatin 6 cycles.  Scheduled to complete radiation on 09/05/17  Allergies  Allergen Reactions  . Cefdinir Swelling    Swelling in mouth   . Cephalosporins Swelling  .  Dairy Aid [Lactase] Swelling    Milk and milk products cause swelling and  tingling of tongue  . Flagyl [Metronidazole] Swelling    Mouth and throat and ears go red and burn  . Lactose Intolerance (Gi) Other (See Comments)    GI upset.  Eats only gluten free  . Penicillins Rash    Rash/hives  . Phenylephrine-Guaifenesin Anaphylaxis    Night terrors  . Sulfonamide Derivatives Rash    Rash/itching.  From head to her toes, the itching was horrible. Penicillin is the worst  . Tape Rash    Adhesives all cause a problem severely.  It doesn't matter if it is paper tape or not.  Marland Kitchen Dexilant [Dexlansoprazole]     Pressure in head like head is in a vice and being squeezed  . Entex Lq [Phenylephrine-Guaifenesin]     Night terrors  . Other Other (See Comments)    Flu vaccine:  Weakness, dizziness, tia type symptoms.    . Oxycodone Nausea And Vomiting    And itching  . Succinylcholine Other (See Comments)    Trouble waking up  . Codeine Nausea And Vomiting  . Guaifenesin & Derivatives Other (See Comments)    Night terrors  . Lactobacillus Itching  . Naproxen Sodium     Itching/rash  . Pseudoephedrine Rash  . Sulfa Antibiotics Rash  . Tetracyclines & Related Other (See Comments)    Was taking flagyl at the same time; unsure which caused the swelling  Swelling of throat. Pharmacist states that he believes it was the flagyl and not the tetracycline  . Wheat Bran Other (See Comments)    Upset stomach. Now eats gluten free     Past Medical History:  Diagnosis Date  . AA (aortic aneurysm) ([place])    a. 04/2011 Echo: Ao Root: 4.1cm, Asc Ao 4.7cm.  . Anxiety   . AV block, 2nd degree    a. 05/2005 - s/p MDT Adapta ADDR01 Dual Chamber PPM  . Bicuspid aortic valve   . Cancer ([place])   . Chest pain    a. 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  . Depression   . Expressive aphasia    a. ongoing since 04/2011 - seen by neurology - ? TIA vs. Migraine  . Facial numbness    a. ongoing since 04/2011 - seen by neurology - ? TIA vs. Migraine  . GERD (gastroesophageal reflux disease)   .  Low blood pressure   . Presence of permanent cardiac pacemaker   . 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 Syncope    a. 04/2011 Echo: EF 55-65%, No RWMA, Gr 1 DD.     Past Surgical History:  Procedure Laterality Date  . ABDOMINAL WALL MESH  REMOVAL    . bladder tack    . cataract Bilateral 2004  . CHOLECYSTECTOMY  2000  . DILATION AND CURETTAGE OF UTERUS    . DIRECT LARYNGOSCOPY Right 06/16/2017   Procedure: MICRO DIRECT LARYNGOSCOPY WITH BIOPSY OF RIGHT BASE OF TONGUE;  Surgeon: Carloyn Manner, MD;  Location: [facility] ORS;  Service: ENT;  Laterality: Right;  . EP IMPLANTABLE DEVICE N/A 06/04/2015   Procedure: PPM Generator Changeout;  Surgeon: Deboraha Sprang, MD;  Location: [place] [facility];  Service: Cardiovascular;  Laterality: N/A;  . ESOPHAGOGASTRODUODENOSCOPY (EGD) WITH PROPOFOL N/A 12/18/2015   Procedure: ESOPHAGOGASTRODUODENOSCOPY (EGD) WITH gastric biopsy and dilation;  Surgeon: Lucilla Lame, MD;  Location: [place];  Service: Endoscopy;  Laterality: N/A;  .  EYE SURGERY    . INSERT / REPLACE / REMOVE PACEMAKER    . IR FLUORO GUIDE PORT INSERTION RIGHT  06/22/2017  . PACEMAKER INSERTION  2006   [employer] Adapta ADDR01  . TONSILLECTOMY    . TONSILLECTOMY    . VAGINAL HYSTERECTOMY  2013   mad/cope    Social History   Socioeconomic History  . Marital status: Married    Spouse name: Not on file  . Number of children: Not on file  . Years of education: Not on file  . Highest education level: Not on file  Social Needs  . Financial resource strain: Not on file  . Food insecurity - worry: Not on file  . Food insecurity - inability: Not on file  . Transportation needs - medical: Not on file  . Transportation needs - non-medical: Not on file  Occupational History  . Not on file  Tobacco Use  . Smoking status: Never Smoker  . Smokeless tobacco: Never Used  . Tobacco comment: tobacco use -no  Substance and Sexual Activity  .  Alcohol use: No  . Drug use: No  . Sexual activity: Yes    Birth control/protection: Surgical  Other Topics Concern  . Not on file  Social History Narrative   Lives in [place] with husband.  Works out regularly.  Works as a Geophysicist/field seismologist - owns own business.     Family History  Problem Relation Age of Onset  . COPD Mother        alive @ 61  . Atrial fibrillation Mother   . Stroke Father        died @ 74  . Breast cancer Maternal Aunt 70  . COPD Maternal Grandmother   . Ovarian cancer Neg Hx   . Diabetes Neg Hx      Current Outpatient Medications:  .  aspirin 81 MG tablet, Take 81 mg by mouth daily., Disp: , Rfl:  .  b complex vitamins tablet, Take 1 tablet by mouth daily., Disp: , Rfl:  .  BLACK ELDERBERRY,BERRY-FLOWER, PO, Take 1 capsule by mouth daily., Disp: , Rfl:  .  CRANBERRY SOFT PO, Take 2 each by mouth daily. , Disp: , Rfl:  .  Digestive Enzymes (DIGESTIVE ENZYME PO), Take 1-2 capsules by mouth daily., Disp: , Rfl:  .  fexofenadine (ALLEGRA) 60 MG tablet, Take 60 mg by mouth daily as needed. , Disp: , Rfl:  .  fluticasone (FLONASE SENSIMIST) 27.5 MCG/SPRAY nasal spray, Place 1 spray into the nose daily as needed. , Disp: , Rfl:  .  lidocaine-prilocaine (EMLA) cream, Apply to affected area once, Disp: 30 g, Rfl: 3 .  loratadine (CLARITIN) 10 MG tablet, Take 10 mg by mouth daily as needed. , Disp: , Rfl:  .  LORazepam (ATIVAN) 0.5 MG tablet, Take 1 tablet (0.5 mg total) by mouth every 6 (six) hours as needed (Nausea or vomiting)., Disp: 30 tablet, Rfl: 0 .  nitroGLYCERIN (NITROSTAT) 0.4 MG SL tablet, Place 1 tablet (0.4 mg total) under the tongue every 5 (five) minutes as needed for chest pain., Disp: 25 tablet, Rfl: 3 .  NON FORMULARY, Take by mouth daily. Bone Up (calcium) , Disp: , Rfl:  .  ondansetron (ZOFRAN) 4 MG tablet, Take 1 tablet (4 mg total) by mouth every 8 (eight) hours as needed for up to 10 doses for nausea or vomiting., Disp: 20 tablet, Rfl:  0 .  ondansetron (ZOFRAN) 8 MG tablet, Take 1 tablet (8  mg total) by mouth 2 (two) times daily as needed. Start on the third day after chemotherapy., Disp: 30 tablet, Rfl: 1 .  Polyethyl Glycol-Propyl Glycol (SYSTANE) 0.4-0.3 % GEL ophthalmic gel, Place 1 application into both eyes daily as needed., Disp: , Rfl:  .  PRESCRIPTION MEDICATION, Place 1 tablet vaginally 3 (three) times a week. E3 1mg  and Testosterone 1mg  compound, Disp: , Rfl:  .  prochlorperazine (COMPAZINE) 10 MG tablet, Take 1 tablet (10 mg total) by mouth every 6 (six) hours as needed (Nausea or vomiting)., Disp: 30 tablet, Rfl: 1 .  dexamethasone (DECADRON) 4 MG tablet, Take 2 tablets by mouth once a day on the day after chemotherapy and then take 2 tablets two times a day for 2 days. Take with food. (Patient not taking: Reported on 08/31/2017), Disp: 30 tablet, Rfl: 1 .  fluconazole (DIFLUCAN) 100 MG tablet, Take 1 tablet (100 mg total) by mouth daily. (Patient not taking: Reported on 08/31/2017), Disp: 5 tablet, Rfl: 0 .  HYDROcodone-acetaminophen (HYCET) 7.5-325 mg/15 ml solution, Take 10 mLs by mouth 4 (four) times daily as needed for moderate pain. (Patient not taking: Reported on 08/26/2017), Disp: 300 mL, Rfl: 0 .  magic mouthwash SOLN, Take 5 mLs by mouth 4 (four) times daily as needed for mouth pain. Swish and swallow (Patient not taking: Reported on 08/19/2017), Disp: 420 mL, Rfl: 1 .  sucralfate (CARAFATE) 1 g tablet, Take 1 tablet (1 g total) by mouth 3 (three) times daily. Dissolve in 2-3 tbsp warm water, swish and swallow. (Patient not taking: Reported on 08/31/2017), Disp: 90 tablet, Rfl: 3 No current facility-administered medications for this visit.   Facility-Administered Medications Ordered in Other Visits:  .  0.9 %  sodium chloride infusion, , Intravenous, Once, Sindy Guadeloupe, MD .  0.9 %  sodium chloride infusion, , Intravenous, Once, Verlon Au, NP .  ondansetron (ZOFRAN) injection 8 mg, 8 mg, Intravenous, Once,  Sindy Guadeloupe, MD  Physical exam:  Vitals:   08/31/17 1404  BP: (!) 88/66  Pulse: (!) 105  Resp: 18  Temp: 98.2 F (36.8 C)  TempSrc: Oral  Weight: 158 lb 9.6 oz (71.9 kg)  Height: 5\' 4"  (1.626 m)   Physical Exam  Constitutional: She is oriented to person, place, and time and well-developed, well-nourished, and in no distress.  HENT:  Head: Normocephalic and atraumatic.  Eyes: Conjunctivae are normal. Pupils are equal, round, and reactive to light. No scleral icterus.  Neck: Normal range of motion. Neck supple.  Cardiovascular: Normal rate, regular rhythm and normal heart sounds.  Pulmonary/Chest: Effort normal and breath sounds normal. No respiratory distress.  Abdominal: Soft. Bowel sounds are normal. There is tenderness.  Musculoskeletal: Normal range of motion.  Neurological: She is alert and oriented to person, place, and time.  Skin: Skin is warm and dry. No pallor.  Psychiatric: Affect normal. Her mood appears anxious.     CMP Latest Ref Rng & Units 08/31/2017  Glucose 65 - 99 mg/dL 102(H)  BUN 6 - 20 mg/dL 16  Creatinine 0.44 - 1.00 mg/dL 0.67  Sodium 135 - 145 mmol/L 137  Potassium 3.5 - 5.1 mmol/L 4.1  Chloride 101 - 111 mmol/L 104  CO2 22 - 32 mmol/L 27  Calcium 8.9 - 10.3 mg/dL 8.7(L)  Total Protein 6.5 - 8.1 g/dL 5.9(L)  Total Bilirubin 0.3 - 1.2 mg/dL 0.9  Alkaline Phos 38 - 126 U/L 76  AST 15 - 41 U/L 19  ALT 14 -  54 U/L 19   CBC Latest Ref Rng & Units 08/31/2017  WBC 3.6 - 11.0 K/uL 8.2  Hemoglobin 12.0 - 16.0 g/dL 10.5(L)  Hematocrit 35.0 - 47.0 % 30.2(L)  Platelets 150 - 440 K/uL 178     Dg Chest 2 View  Result Date: 08/07/2017 CLINICAL DATA:  Productive cough and fever. Oropharyngeal squamous 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 therapy. EXAM: CHEST  2 VIEW COMPARISON:  None. FINDINGS: The heart size and mediastinal contours are within normal limits. 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 Right-sided  Port-A-Cath also in expected position. No pneumothorax identified. Both lungs are clear. The visualized skeletal structures are unremarkable. IMPRESSION: No active cardiopulmonary disease. Electronically Signed   By: Earle Gell M.D.   On: 08/07/2017 20:58     Assessment and plan- Patient is a 62 y.o. female who presents to symptom management clinic today for complaints of nausea and hypotension. 1.  Squamous cell carcinoma of the oropharynx base of tongue-HPV positive stage I T1 N1 M0 status post cycle 6 of weekly cisplatin.  She has now completed 6 cycles of chemotherapy and finishes radiation on 09/05/17.  Given that this was her last dose of cisplatin, dose was increased to 40 mg/m2.  Mildly anemic, hemoglobin 10.5-stable and at baseline for patient during chemotherapy.  WBC 8.2, ANC 6.8.  Afebrile.  Anticipate that her counts will improve given that she has completed chemotherapy.  2. Nausea & Vomiting-patient dry heaving at home.  No problems with swallowing.  Poorly controlled with Compazine.  Poor oral intake likely resulting in hypotension and tachycardia (see discussion below).  Will give Zofran IV 8 mg in clinic today and patient given instructions to continue oral antiemetics at home.  Discussed taking Tums or Zantac for acid symptoms. Discussed taking antiemetics routinely throughout the day over next 1-2 days to prevent symptoms and allow for sufficient oral intake.  Continue oral protein supplements/shakes for adequate calorie intake.  3. Hypotension-likely related to poor oral intake.  Dizziness upon position changes. Will give 1 L IV fluids in clinic today.  Discussed adequate intake of fluids and solids every 1-2 hours.  If she continues to have poor oral intake and stays hypotensive would consider additional IV fluids over the next 24-48 hours.   Visit Diagnosis 1. Squamous cell carcinoma of oropharynx ([place])   2. Chemotherapy induced nausea and vomiting   3. Orthostatic hypotension       Patient expressed understanding and was in agreement with this plan. She also understands that She can call clinic at any time with any questions, concerns, or complaints.  Thank you for allowing me to participate in the care of this very pleasant patient.  A total of (30) minutes of face-to-face time was spent with this patient with greater than 50% of that time in counseling and care-coordination.  [name], DNP, AGNP-C [place] [facility] [phone] (office) 09/06/17 11:39 AM

## 2017-08-31 NOTE — Telephone Encounter (Signed)
Patient called back and reports that she continues to gag and is unable to eat or drink, thinks she needs IV fluids and would like to be seen. "Discussed with Alease Medina, NP and labs, NP, IV fluids appts to be scheduled. Patient accepts appointment for 1PM

## 2017-09-01 ENCOUNTER — Telehealth: Payer: Self-pay | Admitting: *Deleted

## 2017-09-01 ENCOUNTER — Ambulatory Visit
Admission: RE | Admit: 2017-09-01 | Discharge: 2017-09-01 | Disposition: A | Discharge status: Home / Self Care | Payer: BLUE CROSS/BLUE SHIELD | Source: Ambulatory Visit | Attending: Radiation Oncology | Admitting: Radiation Oncology

## 2017-09-01 ENCOUNTER — Inpatient Hospital Stay: Payer: BLUE CROSS/BLUE SHIELD

## 2017-09-01 DIAGNOSIS — C109 Malignant neoplasm of oropharynx, unspecified: Secondary | ICD-10-CM | POA: Diagnosis not present

## 2017-09-01 DIAGNOSIS — C01 Malignant neoplasm of base of tongue: Secondary | ICD-10-CM | POA: Diagnosis not present

## 2017-09-01 DIAGNOSIS — Z510 Encounter for antineoplastic radiation therapy: Secondary | ICD-10-CM | POA: Diagnosis not present

## 2017-09-01 NOTE — Telephone Encounter (Signed)
Patient called to update on her health, She reports that she is feeling better today and only has had a few mild episodes of gagging. She asked if she should get radiation therapy today. I discussed with Lauren and she stated if patient could tolerate it, she should get radiation therapy, she also said if patient feels she needs it, she can get more IV fluids today. Patient states she is not sure if she can tolerate radiation therapy or not and that she does not feel she needs IV fluids today and is already scheduled for IV fluids tomorrow.

## 2017-09-01 NOTE — Progress Notes (Signed)
Nutrition Follow-up:  Patient seen in lobby area following radiation therapy today per patient request.  Noted patient received IV fluids yesterday.  Patient has finished chemotherapy and last radiation treatment is due tomorrow (09/02/17).    Patient reports feels about the same as earlier in the week.  Reports that nausea medication was adjusted yesterday by NP.  Also reports that she has been trying to drink fluids/shakes every hour.  "I am frustrated and tired."  "It is hard to keep up with all this."  Reports ate 1/2 pack of oatmeal and 1/2 egg this am.  Reports not drinking many of the shakes maybe 1-2 per day.  Reports thick saliva, still having gagging feeling but maybe a little bit better.   Medications: reviewed   Labs: glucose 102  Anthropometrics:   Weight decreased to 158 lb 6 oz noted on 2/20 from 163 lb on 2/8.  Initial weight 177 lb on 12/11  10% weight loss in the last 2 months, significant   NUTRITION DIAGNOSIS: predicted suboptimal energy intake continues   MALNUTRITION DIAGNOSIS: severe malnutrition continues   INTERVENTION:   Patient continues to not be able to meet nutritional needs due to treatment side effects. Reviewed strategies to help with thick saliva and fact sheet given. Encouraged patient to continue with small frequent meals/liquids q hour.   Active listening provided and support offered.  Patient has received shipment of [employer] that she requested.   Encouraged patient to take nausea medication as prescribed.     MONITORING, EVALUATION, GOAL: weight trends, intake   NEXT VISIT: phone follow-up in few weeks  Wendy Kelly, [place], [place] Registered Dietitian [phone] (pager)

## 2017-09-02 ENCOUNTER — Inpatient Hospital Stay: Payer: BLUE CROSS/BLUE SHIELD | Admitting: Oncology

## 2017-09-02 ENCOUNTER — Inpatient Hospital Stay: Payer: BLUE CROSS/BLUE SHIELD

## 2017-09-02 ENCOUNTER — Ambulatory Visit
Admission: RE | Admit: 2017-09-02 | Discharge: 2017-09-02 | Disposition: A | Discharge status: Home / Self Care | Payer: BLUE CROSS/BLUE SHIELD | Source: Ambulatory Visit | Attending: Radiation Oncology | Admitting: Radiation Oncology

## 2017-09-02 ENCOUNTER — Ambulatory Visit: Payer: BLUE CROSS/BLUE SHIELD

## 2017-09-02 VITALS — BP 97/65 | HR 76 | Temp 97.6°F | Resp 18

## 2017-09-02 DIAGNOSIS — D696 Thrombocytopenia, unspecified: Secondary | ICD-10-CM | POA: Diagnosis not present

## 2017-09-02 DIAGNOSIS — E860 Dehydration: Secondary | ICD-10-CM | POA: Diagnosis not present

## 2017-09-02 DIAGNOSIS — R609 Edema, unspecified: Secondary | ICD-10-CM | POA: Diagnosis not present

## 2017-09-02 DIAGNOSIS — B379 Candidiasis, unspecified: Secondary | ICD-10-CM | POA: Diagnosis not present

## 2017-09-02 DIAGNOSIS — Z5111 Encounter for antineoplastic chemotherapy: Secondary | ICD-10-CM

## 2017-09-02 DIAGNOSIS — R110 Nausea: Secondary | ICD-10-CM | POA: Diagnosis not present

## 2017-09-02 DIAGNOSIS — M79671 Pain in right foot: Secondary | ICD-10-CM | POA: Diagnosis not present

## 2017-09-02 DIAGNOSIS — C109 Malignant neoplasm of oropharynx, unspecified: Secondary | ICD-10-CM | POA: Diagnosis not present

## 2017-09-02 DIAGNOSIS — Z510 Encounter for antineoplastic radiation therapy: Secondary | ICD-10-CM | POA: Diagnosis not present

## 2017-09-02 DIAGNOSIS — Z7689 Persons encountering health services in other specified circumstances: Secondary | ICD-10-CM | POA: Diagnosis not present

## 2017-09-02 DIAGNOSIS — H9201 Otalgia, right ear: Secondary | ICD-10-CM | POA: Diagnosis not present

## 2017-09-02 DIAGNOSIS — R5383 Other fatigue: Secondary | ICD-10-CM | POA: Diagnosis not present

## 2017-09-02 DIAGNOSIS — R05 Cough: Secondary | ICD-10-CM | POA: Diagnosis not present

## 2017-09-02 DIAGNOSIS — R634 Abnormal weight loss: Secondary | ICD-10-CM | POA: Diagnosis not present

## 2017-09-02 DIAGNOSIS — C01 Malignant neoplasm of base of tongue: Secondary | ICD-10-CM | POA: Diagnosis not present

## 2017-09-02 DIAGNOSIS — R630 Anorexia: Secondary | ICD-10-CM | POA: Diagnosis not present

## 2017-09-02 DIAGNOSIS — I951 Orthostatic hypotension: Secondary | ICD-10-CM | POA: Diagnosis not present

## 2017-09-02 DIAGNOSIS — D709 Neutropenia, unspecified: Secondary | ICD-10-CM | POA: Diagnosis not present

## 2017-09-02 LAB — CBC WITH DIFFERENTIAL/PLATELET
BASOS PCT: 1 %
Basophils Absolute: 0.1 10*3/uL (ref 0–0.1)
EOS ABS: 0 10*3/uL (ref 0–0.7)
Eosinophils Relative: 0 %
HCT: 26.5 % — ABNORMAL LOW (ref 35.0–47.0)
Hemoglobin: 9.5 g/dL — ABNORMAL LOW (ref 12.0–16.0)
Lymphocytes Relative: 4 %
Lymphs Abs: 0.3 10*3/uL — ABNORMAL LOW (ref 1.0–3.6)
MCH: 33.1 pg (ref 26.0–34.0)
MCHC: 36 g/dL (ref 32.0–36.0)
MCV: 91.9 fL (ref 80.0–100.0)
MONO ABS: 0.9 10*3/uL (ref 0.2–0.9)
Monocytes Relative: 11 %
Neutro Abs: 7.1 10*3/uL — ABNORMAL HIGH (ref 1.4–6.5)
Neutrophils Relative %: 84 %
Platelets: 144 10*3/uL — ABNORMAL LOW (ref 150–440)
RBC: 2.89 MIL/uL — ABNORMAL LOW (ref 3.80–5.20)
RDW: 16.8 % — AB (ref 11.5–14.5)
WBC: 8.3 10*3/uL (ref 3.6–11.0)

## 2017-09-02 LAB — COMPREHENSIVE METABOLIC PANEL
ALBUMIN: 3.1 g/dL — AB (ref 3.5–5.0)
ALT: 16 U/L (ref 14–54)
AST: 18 U/L (ref 15–41)
Alkaline Phosphatase: 75 U/L (ref 38–126)
Anion gap: 5 (ref 5–15)
BUN: 14 mg/dL (ref 6–20)
CALCIUM: 8.6 mg/dL — AB (ref 8.9–10.3)
CO2: 27 mmol/L (ref 22–32)
CREATININE: 0.83 mg/dL (ref 0.44–1.00)
Chloride: 105 mmol/L (ref 101–111)
GFR calc Af Amer: >60 mL/min (ref >60)
GFR calc non Af Amer: >60 mL/min (ref >60)
GLUCOSE: 122 mg/dL — AB (ref 65–99)
Potassium: 3.8 mmol/L (ref 3.5–5.1)
SODIUM: 137 mmol/L (ref 135–145)
Total Bilirubin: 0.6 mg/dL (ref 0.3–1.2)
Total Protein: 5.9 g/dL — ABNORMAL LOW (ref 6.5–8.1)

## 2017-09-02 MED ORDER — SODIUM CHLORIDE 0.9% FLUSH
10.0000 mL | Freq: Once | INTRAVENOUS | Status: AC
  Administered 2017-09-02: 10 mL via INTRAVENOUS
  Filled 2017-09-02: qty 10

## 2017-09-02 MED ORDER — HEPARIN SOD (PORK) LOCK FLUSH 100 UNIT/ML IV SOLN
500.0000 [IU] | Freq: Once | INTRAVENOUS | Status: AC
  Administered 2017-09-02: 500 [IU] via INTRAVENOUS
  Filled 2017-09-02: qty 5

## 2017-09-02 MED ORDER — SODIUM CHLORIDE 0.9 % IV SOLN
Freq: Once | INTRAVENOUS | Status: AC
  Administered 2017-09-02: 10:00:00 via INTRAVENOUS
  Filled 2017-09-02: qty 1000

## 2017-09-02 MED ORDER — PROCHLORPERAZINE EDISYLATE 5 MG/ML IJ SOLN
10.0000 mg | Freq: Once | INTRAMUSCULAR | Status: AC
  Administered 2017-09-02: 10 mg via INTRAVENOUS
  Filled 2017-09-02: qty 2

## 2017-09-05 ENCOUNTER — Other Ambulatory Visit: Payer: Self-pay | Admitting: *Deleted

## 2017-09-05 ENCOUNTER — Ambulatory Visit
Admission: RE | Admit: 2017-09-05 | Discharge: 2017-09-05 | Disposition: A | Discharge status: Home / Self Care | Payer: BLUE CROSS/BLUE SHIELD | Source: Ambulatory Visit | Attending: Radiation Oncology | Admitting: Radiation Oncology

## 2017-09-05 ENCOUNTER — Telehealth: Payer: Self-pay | Admitting: *Deleted

## 2017-09-05 DIAGNOSIS — C109 Malignant neoplasm of oropharynx, unspecified: Secondary | ICD-10-CM | POA: Diagnosis not present

## 2017-09-05 DIAGNOSIS — Z510 Encounter for antineoplastic radiation therapy: Secondary | ICD-10-CM | POA: Diagnosis not present

## 2017-09-05 DIAGNOSIS — C01 Malignant neoplasm of base of tongue: Secondary | ICD-10-CM | POA: Diagnosis not present

## 2017-09-05 NOTE — Telephone Encounter (Signed)
Per Alease Medina, NP IV fluids today or tomorrow. Appointment accepted for 2 PM tomorrow

## 2017-09-05 NOTE — Telephone Encounter (Signed)
Called pt to see if she needs refill of her core nturitonal drinks and she says no. She completed chemo 2/15 and will complete radiation 09/05/2017. I will not fill out paper and send in because she does not need refill

## 2017-09-05 NOTE — Telephone Encounter (Signed)
Thank you. I called to chemo today from here in [place] said they were really busy today and tom. Looked better. Thank you for putting her on

## 2017-09-05 NOTE — Telephone Encounter (Signed)
Patient called asking if she can IV fluids today or tomorrow. She has her last radiation therapy appointment today. Please advise

## 2017-09-06 ENCOUNTER — Inpatient Hospital Stay: Payer: BLUE CROSS/BLUE SHIELD

## 2017-09-06 ENCOUNTER — Encounter: Payer: Self-pay | Admitting: Nurse Practitioner

## 2017-09-06 VITALS — BP 120/72 | HR 80 | Temp 97.5°F | Resp 18

## 2017-09-06 DIAGNOSIS — I951 Orthostatic hypotension: Secondary | ICD-10-CM | POA: Diagnosis not present

## 2017-09-06 DIAGNOSIS — B379 Candidiasis, unspecified: Secondary | ICD-10-CM | POA: Diagnosis not present

## 2017-09-06 DIAGNOSIS — R05 Cough: Secondary | ICD-10-CM | POA: Diagnosis not present

## 2017-09-06 DIAGNOSIS — R5383 Other fatigue: Secondary | ICD-10-CM | POA: Diagnosis not present

## 2017-09-06 DIAGNOSIS — E860 Dehydration: Secondary | ICD-10-CM | POA: Diagnosis not present

## 2017-09-06 DIAGNOSIS — H9201 Otalgia, right ear: Secondary | ICD-10-CM | POA: Diagnosis not present

## 2017-09-06 DIAGNOSIS — Z5111 Encounter for antineoplastic chemotherapy: Secondary | ICD-10-CM | POA: Diagnosis not present

## 2017-09-06 DIAGNOSIS — R634 Abnormal weight loss: Secondary | ICD-10-CM | POA: Diagnosis not present

## 2017-09-06 DIAGNOSIS — D709 Neutropenia, unspecified: Secondary | ICD-10-CM | POA: Diagnosis not present

## 2017-09-06 DIAGNOSIS — Z7689 Persons encountering health services in other specified circumstances: Secondary | ICD-10-CM | POA: Diagnosis not present

## 2017-09-06 DIAGNOSIS — R110 Nausea: Secondary | ICD-10-CM | POA: Diagnosis not present

## 2017-09-06 DIAGNOSIS — R609 Edema, unspecified: Secondary | ICD-10-CM | POA: Diagnosis not present

## 2017-09-06 DIAGNOSIS — M79671 Pain in right foot: Secondary | ICD-10-CM | POA: Diagnosis not present

## 2017-09-06 DIAGNOSIS — R630 Anorexia: Secondary | ICD-10-CM | POA: Diagnosis not present

## 2017-09-06 DIAGNOSIS — C109 Malignant neoplasm of oropharynx, unspecified: Secondary | ICD-10-CM | POA: Diagnosis not present

## 2017-09-06 DIAGNOSIS — D696 Thrombocytopenia, unspecified: Secondary | ICD-10-CM | POA: Diagnosis not present

## 2017-09-06 MED ORDER — HEPARIN SOD (PORK) LOCK FLUSH 100 UNIT/ML IV SOLN
500.0000 [IU] | Freq: Once | INTRAVENOUS | Status: AC
  Administered 2017-09-06: 500 [IU] via INTRAVENOUS

## 2017-09-06 MED ORDER — SODIUM CHLORIDE 0.9 % IV SOLN
Freq: Once | INTRAVENOUS | Status: AC
  Administered 2017-09-06: 14:00:00 via INTRAVENOUS
  Filled 2017-09-06: qty 1000

## 2017-09-07 ENCOUNTER — Other Ambulatory Visit: Payer: Self-pay | Admitting: *Deleted

## 2017-09-07 DIAGNOSIS — C760 Malignant neoplasm of head, face and neck: Secondary | ICD-10-CM

## 2017-09-07 MED ORDER — LORAZEPAM 0.5 MG PO TABS: 0.5000 mg | ORAL_TABLET | Freq: Four times a day (QID) | ORAL | 0 refills | Status: DC | PRN

## 2017-09-08 ENCOUNTER — Other Ambulatory Visit: Payer: Self-pay | Admitting: Oncology

## 2017-09-08 ENCOUNTER — Telehealth: Payer: Self-pay | Admitting: *Deleted

## 2017-09-08 ENCOUNTER — Inpatient Hospital Stay (HOSPITAL_BASED_OUTPATIENT_CLINIC_OR_DEPARTMENT_OTHER): Payer: BLUE CROSS/BLUE SHIELD | Admitting: Oncology

## 2017-09-08 ENCOUNTER — Inpatient Hospital Stay: Payer: BLUE CROSS/BLUE SHIELD

## 2017-09-08 VITALS — BP 124/81 | HR 106 | Temp 99.0°F | Resp 18 | Wt 159.4 lb

## 2017-09-08 DIAGNOSIS — Z7689 Persons encountering health services in other specified circumstances: Secondary | ICD-10-CM

## 2017-09-08 DIAGNOSIS — Z803 Family history of malignant neoplasm of breast: Secondary | ICD-10-CM

## 2017-09-08 DIAGNOSIS — T733XXD Exhaustion due to excessive exertion, subsequent encounter: Secondary | ICD-10-CM

## 2017-09-08 DIAGNOSIS — R110 Nausea: Secondary | ICD-10-CM

## 2017-09-08 DIAGNOSIS — H9201 Otalgia, right ear: Secondary | ICD-10-CM

## 2017-09-08 DIAGNOSIS — R630 Anorexia: Secondary | ICD-10-CM | POA: Diagnosis not present

## 2017-09-08 DIAGNOSIS — R609 Edema, unspecified: Secondary | ICD-10-CM

## 2017-09-08 DIAGNOSIS — Z79899 Other long term (current) drug therapy: Secondary | ICD-10-CM

## 2017-09-08 DIAGNOSIS — R05 Cough: Secondary | ICD-10-CM | POA: Diagnosis not present

## 2017-09-08 DIAGNOSIS — R079 Chest pain, unspecified: Secondary | ICD-10-CM

## 2017-09-08 DIAGNOSIS — E860 Dehydration: Secondary | ICD-10-CM

## 2017-09-08 DIAGNOSIS — R634 Abnormal weight loss: Secondary | ICD-10-CM

## 2017-09-08 DIAGNOSIS — Z5189 Encounter for other specified aftercare: Secondary | ICD-10-CM

## 2017-09-08 DIAGNOSIS — B379 Candidiasis, unspecified: Secondary | ICD-10-CM

## 2017-09-08 DIAGNOSIS — K219 Gastro-esophageal reflux disease without esophagitis: Secondary | ICD-10-CM

## 2017-09-08 DIAGNOSIS — J029 Acute pharyngitis, unspecified: Secondary | ICD-10-CM

## 2017-09-08 DIAGNOSIS — R5383 Other fatigue: Secondary | ICD-10-CM | POA: Diagnosis not present

## 2017-09-08 DIAGNOSIS — R0981 Nasal congestion: Secondary | ICD-10-CM

## 2017-09-08 DIAGNOSIS — I951 Orthostatic hypotension: Secondary | ICD-10-CM | POA: Diagnosis not present

## 2017-09-08 DIAGNOSIS — C760 Malignant neoplasm of head, face and neck: Secondary | ICD-10-CM

## 2017-09-08 DIAGNOSIS — C109 Malignant neoplasm of oropharynx, unspecified: Secondary | ICD-10-CM

## 2017-09-08 DIAGNOSIS — M79671 Pain in right foot: Secondary | ICD-10-CM

## 2017-09-08 DIAGNOSIS — F419 Anxiety disorder, unspecified: Secondary | ICD-10-CM

## 2017-09-08 DIAGNOSIS — Z5111 Encounter for antineoplastic chemotherapy: Secondary | ICD-10-CM | POA: Diagnosis not present

## 2017-09-08 DIAGNOSIS — Z7982 Long term (current) use of aspirin: Secondary | ICD-10-CM

## 2017-09-08 DIAGNOSIS — K1231 Oral mucositis (ulcerative) due to antineoplastic therapy: Secondary | ICD-10-CM

## 2017-09-08 DIAGNOSIS — D696 Thrombocytopenia, unspecified: Secondary | ICD-10-CM | POA: Diagnosis not present

## 2017-09-08 DIAGNOSIS — D709 Neutropenia, unspecified: Secondary | ICD-10-CM | POA: Diagnosis not present

## 2017-09-08 DIAGNOSIS — R5381 Other malaise: Secondary | ICD-10-CM

## 2017-09-08 DIAGNOSIS — Z9049 Acquired absence of other specified parts of digestive tract: Secondary | ICD-10-CM

## 2017-09-08 DIAGNOSIS — I714 Abdominal aortic aneurysm, without rupture: Secondary | ICD-10-CM

## 2017-09-08 DIAGNOSIS — F329 Major depressive disorder, single episode, unspecified: Secondary | ICD-10-CM

## 2017-09-08 LAB — CBC WITH DIFFERENTIAL/PLATELET
Basophils Absolute: 0 10*3/uL (ref 0–0.1)
Basophils Relative: 1 %
Eosinophils Absolute: 0 10*3/uL (ref 0–0.7)
Eosinophils Relative: 0 %
HEMATOCRIT: 28.1 % — AB (ref 35.0–47.0)
Hemoglobin: 9.7 g/dL — ABNORMAL LOW (ref 12.0–16.0)
LYMPHS PCT: 5 %
Lymphs Abs: 0.3 10*3/uL — ABNORMAL LOW (ref 1.0–3.6)
MCH: 32.6 pg (ref 26.0–34.0)
MCHC: 34.6 g/dL (ref 32.0–36.0)
MCV: 94.4 fL (ref 80.0–100.0)
MONOS PCT: 11 %
Monocytes Absolute: 0.5 10*3/uL (ref 0.2–0.9)
NEUTROS ABS: 4 10*3/uL (ref 1.4–6.5)
Neutrophils Relative %: 83 %
Platelets: 161 10*3/uL (ref 150–440)
RBC: 2.98 MIL/uL — ABNORMAL LOW (ref 3.80–5.20)
RDW: 18.5 % — AB (ref 11.5–14.5)
WBC: 4.9 10*3/uL (ref 3.6–11.0)

## 2017-09-08 LAB — COMPREHENSIVE METABOLIC PANEL
ALBUMIN: 3.2 g/dL — AB (ref 3.5–5.0)
ALT: 22 U/L (ref 14–54)
ANION GAP: 7 (ref 5–15)
AST: 24 U/L (ref 15–41)
Alkaline Phosphatase: 87 U/L (ref 38–126)
BUN: 11 mg/dL (ref 6–20)
CHLORIDE: 106 mmol/L (ref 101–111)
CO2: 26 mmol/L (ref 22–32)
Calcium: 8.8 mg/dL — ABNORMAL LOW (ref 8.9–10.3)
Creatinine, Ser: 0.71 mg/dL (ref 0.44–1.00)
GFR calc Af Amer: >60 mL/min (ref >60)
GFR calc non Af Amer: >60 mL/min (ref >60)
GLUCOSE: 137 mg/dL — AB (ref 65–99)
POTASSIUM: 3.9 mmol/L (ref 3.5–5.1)
SODIUM: 139 mmol/L (ref 135–145)
Total Bilirubin: 0.5 mg/dL (ref 0.3–1.2)
Total Protein: 5.9 g/dL — ABNORMAL LOW (ref 6.5–8.1)

## 2017-09-08 LAB — MAGNESIUM: Magnesium: 2.1 mg/dL (ref 1.7–2.4)

## 2017-09-08 NOTE — Telephone Encounter (Signed)
Patient added to scheduled for [place] will contact patient via text

## 2017-09-08 NOTE — Telephone Encounter (Signed)
I would be happy to see her. Is she eating and drinking?

## 2017-09-08 NOTE — Telephone Encounter (Signed)
Patient called reporting that she just does not feel well. She is weak and jittery. Asking what she needs to do.She just had IV fluids Tuesday. Please advise

## 2017-09-08 NOTE — Progress Notes (Signed)
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 Consuela Kelly evaluation of right neck mass. The mass has been present for or a year and has been growing gradually. It was associated with some right ear pain but no fever. Patient has been a never smoker.Ultrasound on 05/30/2017 showed:Single enlarged lymph node in the right neck measures approximately 4.8 x 1.7 x 3.0 cm  2.Patient underwent IR guided lymph node biopsy of the cervical lymph node which showed metastatic squamous cell carcinoma. Staining for P 16 was positive and sox 10 was negative  3.PET/CT scan on 06/09/2017 showed intensely hypermetabolic enlarged right level 2 cervical lymph node with an SUV of 11.1. No clear site of primary malignancy within the head and neck. Potential site of investigation would include right base of tongue. There was asymmetric metabolic activity noted in that area with an SUV of 4.5. Less suspicious region in the posterior glottis. No evidence of distant metastatic disease.  4.Patient works as a Geophysicist/field seismologist at [employer]. Patient has had multiple allergies to medications in the past. Reports that when she took steroids in the past it made her quite aggravated. Her sister was hospitalized in a mental health facility after receiving a steroid course in the past.  5. Right tongue base biopsy positive for SCC. Plan is to proceed with concurrent chemo/RT with  cisplatin/RT. Baseline audiogram normal  Interval history-  Patient was last seen in symptom management clinic on 08/31/2017 for dry heaves and nausea. She admitted to poor oral intake that are worsened with smells movement in oral intake. She continued taking anti-emetics as prescribed without improvement. She noted this was significantly impairing her quality of life and she requested IV fluids. She has received 1 L IV fluids on 2/20, 2/22 and 2/26 to date. She has completed chemotherapy as of 2/15 and radiation as of 2/25. She was recently treated for thrush and started on Diflucan. She has 2 more pills left.  Today patient presents for continued weakness,  sleep disturbance and jittery feeling.  She has the following symptoms: fatigue, feelings of losing control, racing thoughts. Onset of symptoms was approximately 1 day ago, gradually worsening since that time. She denies current suicidal and homicidal ideation. Family history significant for no psychiatric illness.Possible organic causes contributing are: cancer diagnosis and treatments. Risk factors: cancer diagnosis Previous treatment includes Ativan and therapy.  She complains of the following side effects from the treatment: weight loss. She admits to taking 0.5 mg Ativan nightly to help with insomnia and nausea and she forgot to take her Ativan last night. She called her pharmacy to ask "what to do". She was instructed to take 0.25 mg Ativan. Additional complaints included continued poor oral intake. She feels weak. Onset was approximately beginning of chemotherapy/radiation. Causes contributing are change in taste. Risk factors: Cancer. Previous treatments include oral supplements such as ensure, boost and core nutritional drinks.   She is here requesting labs and possible IV fluids should she need them.   ECOG FS:1 -  Symptomatic but completely ambulatory  Review of systems- Review of Systems  Constitutional: Negative.  Negative for chills,  fever, malaise/fatigue and weight loss.  HENT: Negative for congestion and ear pain.   Eyes: Negative.  Negative for blurred vision and double vision.  Respiratory: Negative.  Negative for cough, sputum production and shortness of breath.   Cardiovascular: Negative.  Negative for chest pain, palpitations and leg swelling.  Gastrointestinal: Negative.  Negative for abdominal pain, constipation, diarrhea, nausea and vomiting.  Genitourinary: Negative for dysuria, frequency and urgency.  Musculoskeletal: Negative for back pain and falls.  Skin: Negative.  Negative for rash.  Neurological: Positive for tremors. Negative for weakness and headaches.  Endo/Heme/Allergies: Negative.  Does not bruise/bleed easily.  Psychiatric/Behavioral: Negative for depression. The patient is nervous/anxious (significantly worse for one day) and has insomnia.      Current treatment- Finished concurrent chemo/RT on 2/25  Allergies  Allergen Reactions  . Cefdinir Swelling    Swelling in mouth   . Cephalosporins Swelling  . Dairy Aid [Lactase] Swelling    Milk and milk products cause swelling and tingling of tongue  . Flagyl [Metronidazole] Swelling    Mouth and throat and ears go red and burn  . Lactose Intolerance (Gi) Other (See Comments)    GI upset.  Eats only gluten free  . Penicillins Rash    Rash/hives  . Phenylephrine-Guaifenesin Anaphylaxis    Night terrors  . Sulfonamide Derivatives Rash    Rash/itching.  From head to her toes, the itching was horrible. Penicillin is the worst  . Tape Rash    Adhesives all cause a problem severely.  It doesn't matter if it is paper tape or not.  Marland Kitchen Dexilant [Dexlansoprazole]     Pressure in head like head is in a vice and being squeezed  . Entex Lq [Phenylephrine-Guaifenesin]     Night terrors  . Other Other (See Comments)    Flu vaccine:  Weakness, dizziness, tia type symptoms.    . Oxycodone Nausea And Vomiting    And itching  . Succinylcholine Other  (See Comments)    Trouble waking up  . Codeine Nausea And Vomiting  . Guaifenesin & Derivatives Other (See Comments)    Night terrors  . Lactobacillus Itching  . Naproxen Sodium     Itching/rash  . Pseudoephedrine Rash  . Sulfa Antibiotics Rash  . Tetracyclines & Related Other (See Comments)    Was taking flagyl at the same time; unsure which caused the swelling  Swelling of throat. Pharmacist states that he believes it was the flagyl and not the tetracycline  . Wheat Bran Other (See Comments)    Upset stomach. Now eats gluten free     Past Medical History:  Diagnosis Date  . AA (aortic aneurysm) ([place])    a. 04/2011 Echo: Ao Root: 4.1cm, Asc Ao 4.7cm.  . Anxiety   . AV block, 2nd degree    a. 05/2005 - s/p MDT Adapta ADDR01 Dual Chamber PPM  . Bicuspid aortic valve   . Cancer ([place])   . Chest pain    a. 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  . Depression   . Expressive aphasia    a. ongoing since 04/2011 - seen by neurology - ? TIA vs. Migraine  . Facial numbness    a. ongoing since 04/2011 - seen by neurology - ? TIA vs. Migraine  . GERD (gastroesophageal reflux disease)   . Low blood pressure   . Presence of permanent cardiac pacemaker   .  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 Syncope    a. 04/2011 Echo: EF 55-65%, No RWMA, Gr 1 DD.     Past Surgical History:  Procedure Laterality Date  . ABDOMINAL WALL MESH  REMOVAL    . bladder tack    . cataract Bilateral 2004  . CHOLECYSTECTOMY  2000  . DILATION AND CURETTAGE OF UTERUS    . DIRECT LARYNGOSCOPY Right 06/16/2017   Procedure: MICRO DIRECT LARYNGOSCOPY WITH BIOPSY OF RIGHT BASE OF TONGUE;  Surgeon: Carloyn Manner, MD;  Location: [facility];  Service: ENT;  Laterality: Right;  . EP IMPLANTABLE DEVICE N/A 06/04/2015   Procedure: PPM Generator Changeout;  Surgeon: Deboraha Sprang, MD;  Location: [place] [facility];  Service: Cardiovascular;  Laterality: N/A;  . ESOPHAGOGASTRODUODENOSCOPY (EGD) WITH  PROPOFOL N/A 12/18/2015   Procedure: ESOPHAGOGASTRODUODENOSCOPY (EGD) WITH gastric biopsy and dilation;  Surgeon: Lucilla Lame, MD;  Location: [place];  Service: Endoscopy;  Laterality: N/A;  . EYE SURGERY    . INSERT / REPLACE / REMOVE PACEMAKER    . IR FLUORO GUIDE PORT INSERTION RIGHT  06/22/2017  . PACEMAKER INSERTION  2006   Medtronic Adapta ADDR01  . TONSILLECTOMY    . TONSILLECTOMY    . VAGINAL HYSTERECTOMY  2013   mad/cope    Social History   Socioeconomic History  . Marital status: Married    Spouse name: Not on file  . Number of children: Not on file  . Years of education: Not on file  . Highest education level: Not on file  Social Needs  . Financial resource strain: Not on file  . Food insecurity - worry: Not on file  . Food insecurity - inability: Not on file  . Transportation needs - medical: Not on file  . Transportation needs - non-medical: Not on file  Occupational History  . Not on file  Tobacco Use  . Smoking status: Never Smoker  . Smokeless tobacco: Never Used  . Tobacco comment: tobacco use -no  Substance and Sexual Activity  . Alcohol use: No  . Drug use: No  . Sexual activity: Yes    Birth control/protection: Surgical  Other Topics Concern  . Not on file  Social History Narrative   Lives in [place] with husband.  Works out regularly.  Works as a Geophysicist/field seismologist - owns own business.     Family History  Problem Relation Age of Onset  . COPD Mother        alive @ 73  . Atrial fibrillation Mother   . Stroke Father        died @ 45  . Breast cancer Maternal Aunt 70  . COPD Maternal Grandmother   . Ovarian cancer Neg Hx   . Diabetes Neg Hx      Current Outpatient Medications:  .  aspirin 81 MG tablet, Take 81 mg by mouth daily., Disp: , Rfl:  .  b complex vitamins tablet, Take 1 tablet by mouth daily., Disp: , Rfl:  .  BLACK ELDERBERRY,BERRY-FLOWER, PO, Take 1 capsule by mouth daily., Disp: , Rfl:  .  CRANBERRY SOFT PO,  Take 2 each by mouth daily. , Disp: , Rfl:  .  Digestive Enzymes (DIGESTIVE ENZYME PO), Take 1-2 capsules by mouth daily., Disp: , Rfl:  .  fexofenadine (ALLEGRA) 60 MG tablet, Take 60 mg by mouth daily as needed. , Disp: , Rfl:  .  fluconazole (DIFLUCAN) 100 MG  tablet, Take 1 tablet (100 mg total) by mouth daily., Disp: 5 tablet, Rfl: 0 .  fluticasone (FLONASE SENSIMIST) 27.5 MCG/SPRAY nasal spray, Place 1 spray into the nose daily as needed. , Disp: , Rfl:  .  HYDROcodone-acetaminophen (HYCET) 7.5-325 mg/15 ml solution, Take 10 mLs by mouth 4 (four) times daily as needed for moderate pain., Disp: 300 mL, Rfl: 0 .  lidocaine-prilocaine (EMLA) cream, Apply to affected area once, Disp: 30 g, Rfl: 3 .  loratadine (CLARITIN) 10 MG tablet, Take 10 mg by mouth daily as needed. , Disp: , Rfl:  .  LORazepam (ATIVAN) 0.5 MG tablet, TAKE 1 TABLET BY MOUTH EVERY 6 HOURS AS NEEDED FOR NAUSEA AND VOMITING, Disp: 30 tablet, Rfl: 0 .  magic mouthwash SOLN, Take 5 mLs by mouth 4 (four) times daily as needed for mouth pain. Swish and swallow, Disp: 420 mL, Rfl: 1 .  nitroGLYCERIN (NITROSTAT) 0.4 MG SL tablet, Place 1 tablet (0.4 mg total) under the tongue every 5 (five) minutes as needed for chest pain., Disp: 25 tablet, Rfl: 3 .  NON FORMULARY, Take by mouth daily. Bone Up (calcium) , Disp: , Rfl:  .  ondansetron (ZOFRAN) 4 MG tablet, Take 1 tablet (4 mg total) by mouth every 8 (eight) hours as needed for up to 10 doses for nausea or vomiting., Disp: 20 tablet, Rfl: 0 .  ondansetron (ZOFRAN) 8 MG tablet, Take 1 tablet (8 mg total) by mouth 2 (two) times daily as needed. Start on the third day after chemotherapy., Disp: 30 tablet, Rfl: 1 .  Polyethyl Glycol-Propyl Glycol (SYSTANE) 0.4-0.3 % GEL ophthalmic gel, Place 1 application into both eyes daily as needed., Disp: , Rfl:  .  PRESCRIPTION MEDICATION, Place 1 tablet vaginally 3 (three) times a week. E3 1mg  and Testosterone 1mg  compound, Disp: , Rfl:  .   prochlorperazine (COMPAZINE) 10 MG tablet, Take 1 tablet (10 mg total) by mouth every 6 (six) hours as needed (Nausea or vomiting)., Disp: 30 tablet, Rfl: 1 .  sucralfate (CARAFATE) 1 g tablet, Take 1 tablet (1 g total) by mouth 3 (three) times daily. Dissolve in 2-3 tbsp warm water, swish and swallow., Disp: 90 tablet, Rfl: 3 No current facility-administered medications for this visit.   Facility-Administered Medications Ordered in Other Visits:  .  0.9 %  sodium chloride infusion, , Intravenous, Once, Sindy Guadeloupe, MD .  Derrill Memo ON 09/10/2017] 0.9 %  sodium chloride infusion, , Intravenous, Once, Sindy Guadeloupe, MD .  0.9 %  sodium chloride infusion, , Intravenous, Once, Verlon Au, NP, Stopped at 09/09/17 1203 .  heparin lock flush 100 unit/mL, 500 Units, Intravenous, Once, Sindy Guadeloupe, MD  Physical exam:  Vitals:   09/08/17 1525 09/08/17 1734  BP: 124/81   Pulse: (!) 106   Resp: 18   Temp: 99 F (37.2 C)   TempSrc: Tympanic   Weight:  159 lb 6.4 oz (72.3 kg)   Physical Exam  Constitutional: She is oriented to person, place, and time and well-developed, well-nourished, and in no distress. Vital signs are normal.  HENT:  Head: Normocephalic and atraumatic.  Mouth/Throat: Mucous membranes are normal. Oral lesions present.    Eyes: Pupils are equal, round, and reactive to light.  Neck: Normal range of motion.  Cardiovascular: Regular rhythm and normal heart sounds. Tachycardia present.  No murmur heard. Pulmonary/Chest: Effort normal and breath sounds normal. She has no wheezes.  Abdominal: Soft. Normal appearance and bowel sounds are normal. She  exhibits no distension. There is no tenderness.  Musculoskeletal: Normal range of motion. She exhibits no edema.  Neurological: She is alert and oriented to person, place, and time. Gait normal.  Skin: Skin is warm and dry. No rash noted.  Psychiatric: Memory and judgment normal. Her mood appears anxious. Her affect is labile. She  exhibits a depressed mood.  Patient very tearful during her visit.      CMP Latest Ref Rng & Units 09/08/2017  Glucose 65 - 99 mg/dL 137(H)  BUN 6 - 20 mg/dL 11  Creatinine 0.44 - 1.00 mg/dL 0.71  Sodium 135 - 145 mmol/L 139  Potassium 3.5 - 5.1 mmol/L 3.9  Chloride 101 - 111 mmol/L 106  CO2 22 - 32 mmol/L 26  Calcium 8.9 - 10.3 mg/dL 8.8(L)  Total Protein 6.5 - 8.1 g/dL 5.9(L)  Total Bilirubin 0.3 - 1.2 mg/dL 0.5  Alkaline Phos 38 - 126 U/L 87  AST 15 - 41 U/L 24  ALT 14 - 54 U/L 22   CBC Latest Ref Rng & Units 09/08/2017  WBC 3.6 - 11.0 K/uL 4.9  Hemoglobin 12.0 - 16.0 g/dL 9.7(L)  Hematocrit 35.0 - 47.0 % 28.1(L)  Platelets 150 - 440 K/uL 161    No images are attached to the encounter.  No results found.   Assessment and plan- Patient is a 62 y.o. female he presents for weakness and anxiety. On initial assessment patient appears anxious sitting in wheelchair. Bilateral lower extremities are twitching. Patient is tachycardic with a heart rate of 106. Blood pressure stable.  Afebrile. She does not have any pain. Labs are overall really good. She continues to remain anemic with hemoglobin of 9.7 due to chemotherapy. Electrolytes are normal. Small oral lesions present on roof of mouth and left gum. No oral exudate present.  1. Anxiety: Patient appears anxious. Spoke at length about her cancer diagnosis, recent treatment including chemotherapy and radiation and the amount of anxiety she has. She admits to "feeling overwhelmed". She is trying to "calm down". Encouraged her to continue her 0.5 mg Ativan nightly. Explained that this may not get better overnight and may take time. Patient said she has friends to talk to and has been referred to a therapist. She states she will make an appointment.  2. Nutrition/hydration: The patient has been dehydrated in the past and this worries her given she is not eating and drinking as much as she feels she should. She is doing her best using  supplements as prescribed. She would like to schedule out fluids so that she knows they are available should she feel weak. We will schedule IV fluids for tomorrow and weekly with labs until she sees Dr. Janese Banks back n 09/22/2017. Patient is in agreement with this plan.  3. Anemia: likely chemotherapy induced. This will continue to improve. She is not actively bleeding.  4. Oral leisons: Several small oral lesions present. Patient does not seem too bothered by this. Recommended magic mouthwash when necessary. Prescription sent. 5. RTC as scheduled for IV fluids weekly X 2 and then to see Dr. Janese Banks in approximately 2 weeks.    Visit Diagnosis 1. Squamous cell carcinoma of oropharynx ([place])   2. Fatigue due to excessive exertion, subsequent encounter   3. Malaise   4. Anxiety   5. Oral mucositis (ulcerative) due to antineoplastic therapy     Patient expressed understanding and was in agreement with this plan. She also understands that She can call clinic at any time with  any questions, concerns, or complaints.   Greater than 50% was spent in counseling and coordination of care with this patient including but not limited to discussion of the relevant topics above (See A&P) including, but not limited to diagnosis and management of acute and chronic medical conditions.    Faythe Casa, AGNP-C [facility] [place]- [phone] Pager- [phone] 09/09/2017 11:48 AM

## 2017-09-09 ENCOUNTER — Inpatient Hospital Stay: Payer: BLUE CROSS/BLUE SHIELD | Attending: Oncology

## 2017-09-09 DIAGNOSIS — I714 Abdominal aortic aneurysm, without rupture: Secondary | ICD-10-CM | POA: Insufficient documentation

## 2017-09-09 DIAGNOSIS — M549 Dorsalgia, unspecified: Secondary | ICD-10-CM

## 2017-09-09 DIAGNOSIS — Z923 Personal history of irradiation: Secondary | ICD-10-CM | POA: Diagnosis not present

## 2017-09-09 DIAGNOSIS — E860 Dehydration: Secondary | ICD-10-CM | POA: Diagnosis not present

## 2017-09-09 DIAGNOSIS — R5383 Other fatigue: Secondary | ICD-10-CM | POA: Diagnosis not present

## 2017-09-09 DIAGNOSIS — F4322 Adjustment disorder with anxiety: Secondary | ICD-10-CM | POA: Diagnosis not present

## 2017-09-09 DIAGNOSIS — R110 Nausea: Secondary | ICD-10-CM | POA: Diagnosis not present

## 2017-09-09 DIAGNOSIS — C109 Malignant neoplasm of oropharynx, unspecified: Secondary | ICD-10-CM | POA: Diagnosis not present

## 2017-09-09 DIAGNOSIS — D649 Anemia, unspecified: Secondary | ICD-10-CM | POA: Diagnosis not present

## 2017-09-09 DIAGNOSIS — K219 Gastro-esophageal reflux disease without esophagitis: Secondary | ICD-10-CM | POA: Insufficient documentation

## 2017-09-09 DIAGNOSIS — Z79899 Other long term (current) drug therapy: Secondary | ICD-10-CM | POA: Insufficient documentation

## 2017-09-09 DIAGNOSIS — Z9221 Personal history of antineoplastic chemotherapy: Secondary | ICD-10-CM | POA: Diagnosis not present

## 2017-09-09 DIAGNOSIS — I82402 Acute embolism and thrombosis of unspecified deep veins of left lower extremity: Secondary | ICD-10-CM | POA: Insufficient documentation

## 2017-09-09 DIAGNOSIS — Z7901 Long term (current) use of anticoagulants: Secondary | ICD-10-CM | POA: Insufficient documentation

## 2017-09-09 MED ORDER — ACETAMINOPHEN 325 MG PO TABS
325.0000 mg | ORAL_TABLET | Freq: Once | ORAL | Status: AC
  Administered 2017-09-09: 325 mg via ORAL

## 2017-09-09 MED ORDER — HEPARIN SOD (PORK) LOCK FLUSH 100 UNIT/ML IV SOLN
500.0000 [IU] | Freq: Once | INTRAVENOUS | Status: AC
  Administered 2017-09-09: 500 [IU] via INTRAVENOUS
  Filled 2017-09-09: qty 5

## 2017-09-09 MED ORDER — ACETAMINOPHEN 325 MG PO TABS
ORAL_TABLET | ORAL | Status: AC
  Filled 2017-09-09: qty 1

## 2017-09-09 MED ORDER — SODIUM CHLORIDE 0.9 % IV SOLN
Freq: Once | INTRAVENOUS | Status: AC
Start: 2017-09-09 — End: 2017-09-09
  Administered 2017-09-09: 11:00:00 via INTRAVENOUS
  Filled 2017-09-09: qty 1000

## 2017-09-09 MED ORDER — SODIUM CHLORIDE 0.9 % IV SOLN
Freq: Once | INTRAVENOUS | Status: AC
  Filled 2017-09-09: qty 1000

## 2017-09-12 ENCOUNTER — Other Ambulatory Visit: Payer: Self-pay | Admitting: *Deleted

## 2017-09-12 ENCOUNTER — Telehealth: Payer: Self-pay | Admitting: *Deleted

## 2017-09-12 DIAGNOSIS — C760 Malignant neoplasm of head, face and neck: Secondary | ICD-10-CM

## 2017-09-12 DIAGNOSIS — C109 Malignant neoplasm of oropharynx, unspecified: Secondary | ICD-10-CM

## 2017-09-12 MED ORDER — PROCHLORPERAZINE MALEATE 10 MG PO TABS: 10.0000 mg | ORAL_TABLET | Freq: Four times a day (QID) | ORAL | 1 refills | Status: DC | PRN

## 2017-09-12 MED ORDER — SERTRALINE HCL 25 MG PO TABS: 25.0000 mg | ORAL_TABLET | Freq: Every day | ORAL | 0 refills | Status: DC

## 2017-09-12 NOTE — Telephone Encounter (Signed)
Called pt and spoke to her and she sleeps good at night using lorazepam and then the daytime she is bundle of nerves. She does not have taste buds back and so food does not taste good. She eats and drinks but not like she should but then she does not feel like eating either. When she feels anxious she does not want to do much. I spoke to Janese Banks and should would like to see if she wants to join care program and counseling and then can give zoloft  Low dose  25 mg daily for her.  Patient states that she does not want to start antidepressants. She took them over 20 years ago and it was hard to find one that she could tolerate and then it took her a long time to get off the med. She does not remember what it was.  Also she has a Social worker but has only called on her a couple of times.  She does not want to go the care program because she is already coming several times a week to get fluids and she is tired of appts.  I went and spoke to Dr. Janese Banks and she states that is all she would rec: and the zoloft is a antidepressant but is indicated and approved for anxiety disorder.  Patient now agreeable for this and I sent rx to her pharmacy. She also will call counselor and make another appt.  She does not feel good to be at her house alone and her mother and daughter are with her. She will let me know if she has side effects from the zoloft and she will take it once a day.

## 2017-09-12 NOTE — Telephone Encounter (Signed)
Patient called to report that she is having increased anxiety and she can't be still and cannot rest. States she has not missed any of her medications and is asking that something be done. Please advise

## 2017-09-12 NOTE — Telephone Encounter (Signed)
Wendy Kelly will call her and speak to her about starting zoloft

## 2017-09-12 NOTE — Telephone Encounter (Signed)
[facility] is out of compazine. Needs prescription sent to [facility]. Done

## 2017-09-13 ENCOUNTER — Ambulatory Visit (INDEPENDENT_AMBULATORY_CARE_PROVIDER_SITE_OTHER): Payer: BLUE CROSS/BLUE SHIELD | Admitting: *Deleted

## 2017-09-13 ENCOUNTER — Inpatient Hospital Stay: Payer: BLUE CROSS/BLUE SHIELD

## 2017-09-13 VITALS — BP 116/80 | HR 86 | Temp 97.4°F | Resp 18

## 2017-09-13 DIAGNOSIS — R110 Nausea: Secondary | ICD-10-CM | POA: Diagnosis not present

## 2017-09-13 DIAGNOSIS — I714 Abdominal aortic aneurysm, without rupture: Secondary | ICD-10-CM | POA: Diagnosis not present

## 2017-09-13 DIAGNOSIS — C109 Malignant neoplasm of oropharynx, unspecified: Secondary | ICD-10-CM

## 2017-09-13 DIAGNOSIS — I441 Atrioventricular block, second degree: Secondary | ICD-10-CM

## 2017-09-13 DIAGNOSIS — Z9221 Personal history of antineoplastic chemotherapy: Secondary | ICD-10-CM | POA: Diagnosis not present

## 2017-09-13 DIAGNOSIS — E860 Dehydration: Secondary | ICD-10-CM | POA: Diagnosis not present

## 2017-09-13 DIAGNOSIS — D649 Anemia, unspecified: Secondary | ICD-10-CM | POA: Diagnosis not present

## 2017-09-13 DIAGNOSIS — Z79899 Other long term (current) drug therapy: Secondary | ICD-10-CM | POA: Diagnosis not present

## 2017-09-13 DIAGNOSIS — Z7901 Long term (current) use of anticoagulants: Secondary | ICD-10-CM | POA: Diagnosis not present

## 2017-09-13 DIAGNOSIS — R5383 Other fatigue: Secondary | ICD-10-CM | POA: Diagnosis not present

## 2017-09-13 DIAGNOSIS — F4322 Adjustment disorder with anxiety: Secondary | ICD-10-CM | POA: Diagnosis not present

## 2017-09-13 DIAGNOSIS — I82402 Acute embolism and thrombosis of unspecified deep veins of left lower extremity: Secondary | ICD-10-CM | POA: Diagnosis not present

## 2017-09-13 DIAGNOSIS — Z923 Personal history of irradiation: Secondary | ICD-10-CM | POA: Diagnosis not present

## 2017-09-13 DIAGNOSIS — K219 Gastro-esophageal reflux disease without esophagitis: Secondary | ICD-10-CM | POA: Diagnosis not present

## 2017-09-13 LAB — CBC WITH DIFFERENTIAL/PLATELET
Basophils Absolute: 0 10*3/uL (ref 0–0.1)
Basophils Relative: 1 %
EOS PCT: 1 %
Eosinophils Absolute: 0 10*3/uL (ref 0–0.7)
HCT: 29 % — ABNORMAL LOW (ref 35.0–47.0)
Hemoglobin: 9.8 g/dL — ABNORMAL LOW (ref 12.0–16.0)
LYMPHS ABS: 0.3 10*3/uL — AB (ref 1.0–3.6)
LYMPHS PCT: 11 %
MCH: 32.4 pg (ref 26.0–34.0)
MCHC: 33.9 g/dL (ref 32.0–36.0)
MCV: 95.6 fL (ref 80.0–100.0)
MONOS PCT: 13 %
Monocytes Absolute: 0.4 10*3/uL (ref 0.2–0.9)
Neutro Abs: 2.2 10*3/uL (ref 1.4–6.5)
Neutrophils Relative %: 74 %
PLATELETS: 153 10*3/uL (ref 150–440)
RBC: 3.04 MIL/uL — AB (ref 3.80–5.20)
RDW: 19 % — ABNORMAL HIGH (ref 11.5–14.5)
WBC: 2.9 10*3/uL — AB (ref 3.6–11.0)

## 2017-09-13 LAB — CUP PACEART REMOTE DEVICE CHECK
Battery Impedance: 139 Ohm
Battery Remaining Longevity: 148 mo
Brady Statistic AP VP Percent: 0 %
Date Time Interrogation Session: 20190305114149
Implantable Lead Implant Date: 20061128
Implantable Lead Location: 753860
Implantable Lead Model: 4092
Implantable Pulse Generator Implant Date: 20161123
Lead Channel Impedance Value: 470 Ohm
Lead Channel Pacing Threshold Amplitude: 0.375 V
Lead Channel Setting Pacing Amplitude: 2 V
Lead Channel Setting Pacing Amplitude: 2.5 V
Lead Channel Setting Sensing Sensitivity: 4 mV
MDC IDC LEAD IMPLANT DT: 20061128
MDC IDC LEAD LOCATION: 753859
MDC IDC MSMT BATTERY VOLTAGE: 2.79 V
MDC IDC MSMT LEADCHNL RA PACING THRESHOLD PULSEWIDTH: 0.4 ms
MDC IDC MSMT LEADCHNL RV IMPEDANCE VALUE: 638 Ohm
MDC IDC MSMT LEADCHNL RV PACING THRESHOLD AMPLITUDE: 0.75 V
MDC IDC MSMT LEADCHNL RV PACING THRESHOLD PULSEWIDTH: 0.4 ms
MDC IDC SET LEADCHNL RV PACING PULSEWIDTH: 0.4 ms
MDC IDC STAT BRADY AP VS PERCENT: 9 %
MDC IDC STAT BRADY AS VP PERCENT: 0 %
MDC IDC STAT BRADY AS VS PERCENT: 90 %

## 2017-09-13 LAB — COMPREHENSIVE METABOLIC PANEL
ALBUMIN: 3.1 g/dL — AB (ref 3.5–5.0)
ALT: 16 U/L (ref 14–54)
AST: 17 U/L (ref 15–41)
Alkaline Phosphatase: 82 U/L (ref 38–126)
Anion gap: 7 (ref 5–15)
BUN: 13 mg/dL (ref 6–20)
CHLORIDE: 109 mmol/L (ref 101–111)
CO2: 25 mmol/L (ref 22–32)
Calcium: 8.8 mg/dL — ABNORMAL LOW (ref 8.9–10.3)
Creatinine, Ser: 0.7 mg/dL (ref 0.44–1.00)
GFR calc Af Amer: >60 mL/min (ref >60)
Glucose, Bld: 93 mg/dL (ref 65–99)
POTASSIUM: 3.9 mmol/L (ref 3.5–5.1)
SODIUM: 141 mmol/L (ref 135–145)
Total Bilirubin: 0.3 mg/dL (ref 0.3–1.2)
Total Protein: 6 g/dL — ABNORMAL LOW (ref 6.5–8.1)

## 2017-09-13 LAB — MAGNESIUM: MAGNESIUM: 2.2 mg/dL (ref 1.7–2.4)

## 2017-09-13 MED ORDER — SODIUM CHLORIDE 0.9 % IV SOLN
Freq: Once | INTRAVENOUS | Status: AC
  Administered 2017-09-13: 11:00:00 via INTRAVENOUS
  Filled 2017-09-13: qty 1000

## 2017-09-13 MED ORDER — PROCHLORPERAZINE EDISYLATE 5 MG/ML IJ SOLN
10.0000 mg | Freq: Once | INTRAMUSCULAR | Status: AC
  Administered 2017-09-13: 10 mg via INTRAVENOUS
  Filled 2017-09-13: qty 2

## 2017-09-13 MED ORDER — HEPARIN SOD (PORK) LOCK FLUSH 100 UNIT/ML IV SOLN
500.0000 [IU] | Freq: Once | INTRAVENOUS | Status: AC
  Administered 2017-09-13: 500 [IU] via INTRAVENOUS

## 2017-09-13 NOTE — Progress Notes (Signed)
Remote pacemaker transmission.   

## 2017-09-14 ENCOUNTER — Encounter: Payer: Self-pay | Admitting: Cardiology

## 2017-09-15 ENCOUNTER — Telehealth: Payer: Self-pay | Admitting: *Deleted

## 2017-09-15 ENCOUNTER — Inpatient Hospital Stay (HOSPITAL_BASED_OUTPATIENT_CLINIC_OR_DEPARTMENT_OTHER): Payer: BLUE CROSS/BLUE SHIELD | Admitting: Nurse Practitioner

## 2017-09-15 ENCOUNTER — Encounter: Payer: Self-pay | Admitting: Nurse Practitioner

## 2017-09-15 VITALS — BP 124/86 | HR 92 | Temp 97.6°F | Resp 18 | Ht 64.0 in | Wt 156.0 lb

## 2017-09-15 DIAGNOSIS — F4322 Adjustment disorder with anxiety: Secondary | ICD-10-CM

## 2017-09-15 DIAGNOSIS — I82402 Acute embolism and thrombosis of unspecified deep veins of left lower extremity: Secondary | ICD-10-CM | POA: Diagnosis not present

## 2017-09-15 DIAGNOSIS — C109 Malignant neoplasm of oropharynx, unspecified: Secondary | ICD-10-CM | POA: Diagnosis not present

## 2017-09-15 DIAGNOSIS — E860 Dehydration: Secondary | ICD-10-CM | POA: Diagnosis not present

## 2017-09-15 DIAGNOSIS — I714 Abdominal aortic aneurysm, without rupture: Secondary | ICD-10-CM | POA: Diagnosis not present

## 2017-09-15 DIAGNOSIS — Z9221 Personal history of antineoplastic chemotherapy: Secondary | ICD-10-CM

## 2017-09-15 DIAGNOSIS — R110 Nausea: Secondary | ICD-10-CM | POA: Diagnosis not present

## 2017-09-15 DIAGNOSIS — K219 Gastro-esophageal reflux disease without esophagitis: Secondary | ICD-10-CM | POA: Diagnosis not present

## 2017-09-15 DIAGNOSIS — Z79899 Other long term (current) drug therapy: Secondary | ICD-10-CM

## 2017-09-15 DIAGNOSIS — Z923 Personal history of irradiation: Secondary | ICD-10-CM

## 2017-09-15 DIAGNOSIS — Z7901 Long term (current) use of anticoagulants: Secondary | ICD-10-CM | POA: Diagnosis not present

## 2017-09-15 DIAGNOSIS — R5383 Other fatigue: Secondary | ICD-10-CM | POA: Diagnosis not present

## 2017-09-15 DIAGNOSIS — D649 Anemia, unspecified: Secondary | ICD-10-CM | POA: Diagnosis not present

## 2017-09-15 NOTE — Telephone Encounter (Signed)
Patient called asking to be seen today for her anxiety. States that she does well at night with taking her lorazepam , but has increased anxiety during day and is asking if there isn't something else she can take other than Zoloft for it. Please advise

## 2017-09-15 NOTE — Progress Notes (Signed)
Pt states that her leg jerking is not from anxiety. She can't control it.  Her husband states she has never done this with her leg til after she was on chemo. He states she has anxiety but this is not it. She has a Social worker but she does not prescribe meds-it is through [facility].  She is not happy about psych md.

## 2017-09-15 NOTE — Progress Notes (Signed)
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 Janese Banks, on 08/26/17.  Initially patient was seen by Dr. Pryor Ochoa for evaluation of right neck mass.  At that time, mass had been present for 1 year and gradually growing with some associated right ear pain.  Ultrasound on 05/30/17 showed single enlarged lymph node in the right neck measuring 4.8 x 1.7 x  3.0 cm.  Biopsy showed metastatic squamous cell carcinoma.  PET on 06/09/17 showed intensely hypermetabolic enlarged right level 2 cervical lymph node with SUV 11.1 and no clear primary malignancy within her neck.  Concern for primary site of right tongue base and that there was asymmetric metabolic activity noted in the area with SUV of 4.5.  No evidence of distant metastatic disease.  Biopsy of right tongue base positive for squamous cell carcinoma.  Plan initiated to proceed with concurrent chemoradiation.  Baseline audiogram was normal.  Comorbidities complicating care: multiple drug allergies, anxiety   Interval history-patient presents to symptom management clinic for complaints of anxiety and feels she says 'I've never had a panic attack but I think this must be what it feels like'.  She has the following symptoms: difficulty concentrating, fatigue, feelings of losing control, irritable, psychomotor agitation, racing thoughts.  Onset of symptoms was approximately 2 months ago, gradually worsening since that time.  She denies current suicidal and  homicidal ideation.  Risk factors: disease status, treatment for cancer, not currently working, has stopped exercising  Previous treatment includes Ativan and Zoloft and individual therapy.  Initially she was taking Ativan for nausea. This was effective for improving her sleep and Dr. Janese Banks switched to nightly dosing. Zoloft was started on 09/12/17. She complains of some disturbing dreams when she takes the Ativan but otherwise tolerates it well.   ECOG FS:0 - Asymptomatic  Review of systems- Review of Systems  Constitutional: Negative.  Negative for chills, fever, malaise/fatigue and weight loss.  HENT: Negative for congestion and ear pain.   Eyes: Negative.  Negative for blurred vision and double vision.  Respiratory: Negative.  Negative for cough, sputum production and shortness of breath.   Cardiovascular: Negative.  Negative for chest pain, palpitations and leg swelling.  Gastrointestinal: Negative.  Negative for abdominal pain, constipation, diarrhea, nausea and vomiting.  Genitourinary: Negative for dysuria, frequency and urgency.  Musculoskeletal: Negative for back pain and falls.  Skin: Negative.  Negative for rash.  Neurological: Positive for tremors (leg shaking). Negative for weakness and headaches.  Endo/Heme/Allergies: Negative.  Does not bruise/bleed easily.  Psychiatric/Behavioral: Negative for depression. The patient is nervous/anxious (acute exacerbation) and has insomnia.      Current treatment- Finished concurrent chemo/RT on 2/25  Allergies  Allergen Reactions  . Cefdinir Swelling    Swelling in mouth   . Cephalosporins Swelling  . Dairy Aid [Lactase] Swelling    Milk and milk products cause swelling and tingling of tongue  . Flagyl [Metronidazole] Swelling    Mouth and throat and ears go red and burn  . Lactose Intolerance (Gi) Other (See Comments)  GI upset.  Eats only gluten free  . Penicillins Rash    Rash/hives  . Phenylephrine-Guaifenesin Anaphylaxis     Night terrors  . Sulfonamide Derivatives Rash    Rash/itching.  From head to her toes, the itching was horrible. Penicillin is the worst  . Tape Rash    Adhesives all cause a problem severely.  It doesn't matter if it is paper tape or not.  Marland Kitchen Dexilant [Dexlansoprazole]     Pressure in head like head is in a vice and being squeezed  . Entex Lq [Phenylephrine-Guaifenesin]     Night terrors  . Other Other (See Comments)    Flu vaccine:  Weakness, dizziness, tia type symptoms.    . Oxycodone Nausea And Vomiting    And itching  . Succinylcholine Other (See Comments)    Trouble waking up  . Codeine Nausea And Vomiting  . Guaifenesin & Derivatives Other (See Comments)    Night terrors  . Lactobacillus Itching  . Naproxen Sodium     Itching/rash  . Pseudoephedrine Rash  . Sulfa Antibiotics Rash  . Tetracyclines & Related Other (See Comments)    Was taking flagyl at the same time; unsure which caused the swelling  Swelling of throat. Pharmacist states that he believes it was the flagyl and not the tetracycline  . Wheat Bran Other (See Comments)    Upset stomach. Now eats gluten free    Past Medical History:  Diagnosis Date  . AA (aortic aneurysm) ([place])    a. 04/2011 Echo: Ao Root: 4.1cm, Asc Ao 4.7cm.  . Anxiety   . AV block, 2nd degree    a. 05/2005 - s/p MDT Adapta ADDR01 Dual Chamber PPM  . Bicuspid aortic valve   . Cancer ([place])   . Chest pain    a. 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  . Depression   . Expressive aphasia    a. ongoing since 04/2011 - seen by neurology - ? TIA vs. Migraine  . Facial numbness    a. ongoing since 04/2011 - seen by neurology - ? TIA vs. Migraine  . GERD (gastroesophageal reflux disease)   . Low blood pressure   . Presence of permanent cardiac pacemaker   . 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 Syncope    a. 04/2011 Echo: EF 55-65%, No RWMA, Gr 1 DD.    Past Surgical History:  Procedure Laterality Date  . ABDOMINAL WALL  MESH  REMOVAL    . bladder tack    . cataract Bilateral 2004  . CHOLECYSTECTOMY  2000  . DILATION AND CURETTAGE OF UTERUS    . DIRECT LARYNGOSCOPY Right 06/16/2017   Procedure: MICRO DIRECT LARYNGOSCOPY WITH BIOPSY OF RIGHT BASE OF TONGUE;  Surgeon: Carloyn Manner, MD;  Location: [facility] ORS;  Service: ENT;  Laterality: Right;  . EP IMPLANTABLE DEVICE N/A 06/04/2015   Procedure: PPM Generator Changeout;  Surgeon: Deboraha Sprang, MD;  Location: [place] [facility];  Service: Cardiovascular;  Laterality: N/A;  . ESOPHAGOGASTRODUODENOSCOPY (EGD) WITH PROPOFOL N/A 12/18/2015   Procedure: ESOPHAGOGASTRODUODENOSCOPY (EGD) WITH gastric biopsy and dilation;  Surgeon: Lucilla Lame, MD;  Location: [place];  Service: Endoscopy;  Laterality: N/A;  . EYE SURGERY    . INSERT / REPLACE / REMOVE PACEMAKER    . IR FLUORO GUIDE PORT INSERTION RIGHT  06/22/2017  . PACEMAKER INSERTION  2006   [employer] Adapta ADDR01  . TONSILLECTOMY    . TONSILLECTOMY    .  VAGINAL HYSTERECTOMY  2013   mad/cope    Social History   Socioeconomic History  . Marital status: Married    Spouse name: Not on file  . Number of children: Not on file  . Years of education: Not on file  . Highest education level: Not on file  Social Needs  . Financial resource strain: Not on file  . Food insecurity - worry: Not on file  . Food insecurity - inability: Not on file  . Transportation needs - medical: Not on file  . Transportation needs - non-medical: Not on file  Occupational History  . Not on file  Tobacco Use  . Smoking status: Never Smoker  . Smokeless tobacco: Never Used  . Tobacco comment: tobacco use -no  Substance and Sexual Activity  . Alcohol use: No  . Drug use: No  . Sexual activity: Yes    Birth control/protection: Surgical  Other Topics Concern  . Not on file  Social History Narrative   Lives in [place] with husband.  Works out regularly.  Works as a Geophysicist/field seismologist - owns own business.       Family History  Problem Relation Age of Onset  . COPD Mother        alive @ 42  . Atrial fibrillation Mother   . Stroke Father        died @ 16  . Breast cancer Maternal Aunt 70  . COPD Maternal Grandmother   . Ovarian cancer Neg Hx   . Diabetes Neg Hx     Current Outpatient Medications:  .  aspirin 81 MG tablet, Take 81 mg by mouth daily., Disp: , Rfl:  .  b complex vitamins tablet, Take 1 tablet by mouth daily., Disp: , Rfl:  .  BLACK ELDERBERRY,BERRY-FLOWER, PO, Take 1 capsule by mouth daily., Disp: , Rfl:  .  CRANBERRY SOFT PO, Take 2 each by mouth daily. , Disp: , Rfl:  .  Digestive Enzymes (DIGESTIVE ENZYME PO), Take 1-2 capsules by mouth daily., Disp: , Rfl:  .  fexofenadine (ALLEGRA) 60 MG tablet, Take 60 mg by mouth daily as needed. , Disp: , Rfl:  .  fluconazole (DIFLUCAN) 100 MG tablet, Take 1 tablet (100 mg total) by mouth daily., Disp: 5 tablet, Rfl: 0 .  fluticasone (FLONASE SENSIMIST) 27.5 MCG/SPRAY nasal spray, Place 1 spray into the nose daily as needed. , Disp: , Rfl:  .  HYDROcodone-acetaminophen (HYCET) 7.5-325 mg/15 ml solution, Take 10 mLs by mouth 4 (four) times daily as needed for moderate pain., Disp: 300 mL, Rfl: 0 .  lidocaine-prilocaine (EMLA) cream, Apply to affected area once, Disp: 30 g, Rfl: 3 .  loratadine (CLARITIN) 10 MG tablet, Take 10 mg by mouth daily as needed. , Disp: , Rfl:  .  LORazepam (ATIVAN) 0.5 MG tablet, TAKE 1 TABLET BY MOUTH EVERY 6 HOURS AS NEEDED FOR NAUSEA AND VOMITING, Disp: 30 tablet, Rfl: 0 .  nitroGLYCERIN (NITROSTAT) 0.4 MG SL tablet, Place 1 tablet (0.4 mg total) under the tongue every 5 (five) minutes as needed for chest pain., Disp: 25 tablet, Rfl: 3 .  NON FORMULARY, Take by mouth daily. Bone Up (calcium) , Disp: , Rfl:  .  ondansetron (ZOFRAN) 4 MG tablet, Take 1 tablet (4 mg total) by mouth every 8 (eight) hours as needed for up to 10 doses for nausea or vomiting., Disp: 20 tablet, Rfl: 0 .  ondansetron (ZOFRAN) 8 MG  tablet, Take 1 tablet (8 mg total)  by mouth 2 (two) times daily as needed. Start on the third day after chemotherapy., Disp: 30 tablet, Rfl: 1 .  Polyethyl Glycol-Propyl Glycol (SYSTANE) 0.4-0.3 % GEL ophthalmic gel, Place 1 application into both eyes daily as needed., Disp: , Rfl:  .  PRESCRIPTION MEDICATION, Place 1 tablet vaginally 3 (three) times a week. E3 1mg  and Testosterone 1mg  compound, Disp: , Rfl:  .  prochlorperazine (COMPAZINE) 10 MG tablet, Take 1 tablet (10 mg total) by mouth every 6 (six) hours as needed (Nausea or vomiting)., Disp: 30 tablet, Rfl: 1 .  sertraline (ZOLOFT) 25 MG tablet, Take 1 tablet (25 mg total) by mouth daily., Disp: 30 tablet, Rfl: 0 .  magic mouthwash SOLN, Take 5 mLs by mouth 4 (four) times daily as needed for mouth pain. Swish and swallow (Patient not taking: Reported on 09/15/2017), Disp: 420 mL, Rfl: 1 .  sucralfate (CARAFATE) 1 g tablet, Take 1 tablet (1 g total) by mouth 3 (three) times daily. Dissolve in 2-3 tbsp warm water, swish and swallow. (Patient not taking: Reported on 09/15/2017), Disp: 90 tablet, Rfl: 3 No current facility-administered medications for this visit.   Facility-Administered Medications Ordered in Other Visits:  .  0.9 %  sodium chloride infusion, , Intravenous, Once, Sindy Guadeloupe, MD  Physical exam:  Vitals:   09/15/17 1200  BP: 124/86  Pulse: 92  Resp: 18  Temp: 97.6 F (36.4 C)  TempSrc: Tympanic  Weight: 156 lb (70.8 kg)  Height: 5\' 4"  (1.626 m)   Physical Exam  Constitutional: She is oriented to person, place, and time and well-developed, well-nourished, and in no distress. Vital signs are normal.  HENT:  Head: Normocephalic and atraumatic.  Mouth/Throat: Oropharynx is clear and moist and mucous membranes are normal.  Eyes: Pupils are equal, round, and reactive to light.  Neck: Normal range of motion.  Cardiovascular: Normal rate, regular rhythm and normal heart sounds.  No murmur heard. Pulmonary/Chest: Effort  normal and breath sounds normal. She has no wheezes.  Abdominal: Soft. Normal appearance and bowel sounds are normal. She exhibits no distension. There is no tenderness.  Musculoskeletal: Normal range of motion. She exhibits no edema.  Neurological: She is alert and oriented to person, place, and time. Gait normal.  Skin: Skin is warm and dry. No rash noted.  Psychiatric: Memory and judgment normal. Her mood appears anxious.  Accompanied by husband   CMP Latest Ref Rng & Units 09/13/2017  Glucose 65 - 99 mg/dL 93  BUN 6 - 20 mg/dL 13  Creatinine 0.44 - 1.00 mg/dL 0.70  Sodium 135 - 145 mmol/L 141  Potassium 3.5 - 5.1 mmol/L 3.9  Chloride 101 - 111 mmol/L 109  CO2 22 - 32 mmol/L 25  Calcium 8.9 - 10.3 mg/dL 8.8(L)  Total Protein 6.5 - 8.1 g/dL 6.0(L)  Total Bilirubin 0.3 - 1.2 mg/dL 0.3  Alkaline Phos 38 - 126 U/L 82  AST 15 - 41 U/L 17  ALT 14 - 54 U/L 16   CBC Latest Ref Rng & Units 09/13/2017  WBC 3.6 - 11.0 K/uL 2.9(L)  Hemoglobin 12.0 - 16.0 g/dL 9.8(L)  Hematocrit 35.0 - 47.0 % 29.0(L)  Platelets 150 - 440 K/uL 153    No results found.   Assessment and plan- Patient is a 62 y.o. female who presents to Symptom Management Clinic for anxiety.   1.  Squamous cell carcinoma of the oropharynx-stage I (cT1, cN1, cM0, p16 +), s/p concurrent chemo and radiation. She completed 6  cycles of weekly cisplatin on 08/26/17 and completed radiation on 09/02/17. Has required IV fluids and anti-emetics for fatigue, dehydration, nausea, and poor oral intake. Followed by dietician.  Follow-up with Dr. Baruch Gouty on 10/10/17 for post-treatment re-evaluation. Follow up with Dr. Janese Banks for labs and re-evaluation on 09/22/17.  2. Adjustment Disorder with Anxiety: Patient continues to have difficulty adjusting to diagnosis, treatment, and survivorship. Anxiety symptoms persist and worsening acutely. Continues to be out of work and not returned to her pre-treatment routines. Started on Zoloft for persistent  symptoms by Dr. Janese Banks less than 1 week ago.  Previous reports of vivid dreams may be s/e of Ativan vs r/t daytime anxiety. Continue to monitor. Would anticipate being able to dose reduce Ativan his Zoloft takes effect.  Patient has previously seen counselors/social work at [facility].  Patient agreeable to making appointment and continuing services.  Discussed the role of psychiatry as medication management.  Patient reluctant to see psychiatry at this point.  If antianxiety medication is needed long-term, would consider referral to psychiatry for continued medication management.  Encouraged her to take advantage of [facility] counselors as symptoms are most improved with combination of medication and talk therapy with goal of developing coping skills.  Also offered referral to Janeann Merl, LCSW at [facility]. Consulted with Dr. Janese Banks given acute symptoms. Continue Zoloft. Will adjust dose of Ativan to 0.25 mg (1/2 tab) in morning and 0.25 mg at night. Can up titrate evening dose to 0.5 (whole tab) mg if needed.  Follow up with Dr. Janese Banks for labs and re-evaluation on 09/22/17. Follow-up with Dr. Baruch Gouty on 10/10/17 for post-treatment re-evaluation.    Visit Diagnosis 1. Adjustment disorder with anxiety   2. Squamous cell carcinoma of oropharynx ([place])     Patient expressed understanding and was in agreement with this plan. She also understands that She can call clinic at any time with any questions, concerns, or complaints.   A total of (30) minutes of face-to-face time was spent with this patient with greater than 50% of that time in counseling and care-coordination.  [name], DNP, AGNP-C [place] at [facility] [phone] (office) 09/19/17 2:19 PM

## 2017-09-15 NOTE — Telephone Encounter (Signed)
Called pt today and she said she was worse yest. With anxiety. She is at her home with her mother. The person she talks to is a Social worker not a psychiatrist. She says that the zoloft is not working and I told her it take 2-3 weeks to kick in to start helping. She is agreeable to coming in today and see LAuren at 11:30

## 2017-09-16 ENCOUNTER — Telehealth: Payer: Self-pay | Admitting: *Deleted

## 2017-09-16 NOTE — Telephone Encounter (Signed)
Called pt to see how she is doing on the ativan. She said she did good with day yest. But last night she only took 1/2 tablet of Ativan and she slept from 8:30 -12:30 and then from 12:30 to 3 am she was lightly sleeping and hearing everything. Awake for rest of day. She did exercise yest. For 15 min. She went to gym today and she did 4 different machines with 1 rep of 10. And she rode stationary bike for 15 min. .  I told her to try 1 pill tonight and then alternate the next day 1/2 pill and document your night sleep and if you have bad dreams to let us know on Monday.  Cont. Taking the ativan 1/2 tablet in day time. She states she will and will let us know.  Also she should cont. To do exercise and she plans on going to gym

## 2017-09-19 ENCOUNTER — Encounter: Payer: Self-pay | Admitting: Nurse Practitioner

## 2017-09-19 ENCOUNTER — Telehealth: Payer: Self-pay | Admitting: Nurse Practitioner

## 2017-09-19 NOTE — Telephone Encounter (Signed)
Called patient. States she is doing 'better'. She is continuing to exercise and feels that has improved her symptoms. She will continue regimen as before and follow up with Dr. Janese Banks on Thursday. All questions answered.

## 2017-09-22 ENCOUNTER — Encounter: Payer: Self-pay | Admitting: Oncology

## 2017-09-22 ENCOUNTER — Inpatient Hospital Stay: Payer: BLUE CROSS/BLUE SHIELD

## 2017-09-22 ENCOUNTER — Inpatient Hospital Stay (HOSPITAL_BASED_OUTPATIENT_CLINIC_OR_DEPARTMENT_OTHER): Payer: BLUE CROSS/BLUE SHIELD | Admitting: Oncology

## 2017-09-22 VITALS — BP 104/72 | HR 83 | Temp 97.2°F | Resp 18 | Wt 156.4 lb

## 2017-09-22 DIAGNOSIS — Z923 Personal history of irradiation: Secondary | ICD-10-CM | POA: Diagnosis not present

## 2017-09-22 DIAGNOSIS — I82402 Acute embolism and thrombosis of unspecified deep veins of left lower extremity: Secondary | ICD-10-CM | POA: Diagnosis not present

## 2017-09-22 DIAGNOSIS — E860 Dehydration: Secondary | ICD-10-CM

## 2017-09-22 DIAGNOSIS — R5383 Other fatigue: Secondary | ICD-10-CM | POA: Diagnosis not present

## 2017-09-22 DIAGNOSIS — R110 Nausea: Secondary | ICD-10-CM | POA: Diagnosis not present

## 2017-09-22 DIAGNOSIS — Z79899 Other long term (current) drug therapy: Secondary | ICD-10-CM

## 2017-09-22 DIAGNOSIS — K219 Gastro-esophageal reflux disease without esophagitis: Secondary | ICD-10-CM | POA: Diagnosis not present

## 2017-09-22 DIAGNOSIS — C109 Malignant neoplasm of oropharynx, unspecified: Secondary | ICD-10-CM

## 2017-09-22 DIAGNOSIS — I714 Abdominal aortic aneurysm, without rupture: Secondary | ICD-10-CM

## 2017-09-22 DIAGNOSIS — Z9221 Personal history of antineoplastic chemotherapy: Secondary | ICD-10-CM

## 2017-09-22 DIAGNOSIS — D649 Anemia, unspecified: Secondary | ICD-10-CM

## 2017-09-22 DIAGNOSIS — Z7901 Long term (current) use of anticoagulants: Secondary | ICD-10-CM | POA: Diagnosis not present

## 2017-09-22 DIAGNOSIS — F4322 Adjustment disorder with anxiety: Secondary | ICD-10-CM | POA: Diagnosis not present

## 2017-09-22 DIAGNOSIS — F419 Anxiety disorder, unspecified: Secondary | ICD-10-CM

## 2017-09-22 LAB — CBC WITH DIFFERENTIAL/PLATELET
BASOS ABS: 0 10*3/uL (ref 0–0.1)
Basophils Relative: 1 %
EOS PCT: 1 %
Eosinophils Absolute: 0 10*3/uL (ref 0–0.7)
HEMATOCRIT: 27.9 % — AB (ref 35.0–47.0)
HEMOGLOBIN: 9.5 g/dL — AB (ref 12.0–16.0)
LYMPHS ABS: 0.3 10*3/uL — AB (ref 1.0–3.6)
LYMPHS PCT: 4 %
MCH: 32.8 pg (ref 26.0–34.0)
MCHC: 34.2 g/dL (ref 32.0–36.0)
MCV: 96.1 fL (ref 80.0–100.0)
Monocytes Absolute: 0.5 10*3/uL (ref 0.2–0.9)
Monocytes Relative: 9 %
NEUTROS ABS: 5 10*3/uL (ref 1.4–6.5)
NEUTROS PCT: 85 %
Platelets: 161 10*3/uL (ref 150–440)
RBC: 2.91 MIL/uL — ABNORMAL LOW (ref 3.80–5.20)
RDW: 17.5 % — ABNORMAL HIGH (ref 11.5–14.5)
WBC: 5.9 10*3/uL (ref 3.6–11.0)

## 2017-09-22 MED ORDER — HEPARIN SOD (PORK) LOCK FLUSH 100 UNIT/ML IV SOLN
500.0000 [IU] | Freq: Once | INTRAVENOUS | Status: AC
  Administered 2017-09-22: 500 [IU] via INTRAVENOUS

## 2017-09-22 MED ORDER — HEPARIN SOD (PORK) LOCK FLUSH 100 UNIT/ML IV SOLN
INTRAVENOUS | Status: AC
  Filled 2017-09-22: qty 5

## 2017-09-22 MED ORDER — SODIUM CHLORIDE 0.9 % IV SOLN
Freq: Once | INTRAVENOUS | Status: AC
  Administered 2017-09-22: 12:00:00 via INTRAVENOUS
  Filled 2017-09-22: qty 1000

## 2017-09-22 NOTE — Progress Notes (Signed)
Pt in for follow up reports fair appetite, drinking shakes.  Pt continues to having issue with right leg involuntary shakes,

## 2017-09-23 ENCOUNTER — Other Ambulatory Visit: Payer: Self-pay | Admitting: *Deleted

## 2017-09-23 NOTE — Progress Notes (Signed)
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 Consuela Mimes evaluation of right neck mass. The mass has been present for or a year and has been growing gradually. It was associated with some right ear pain but no fever. Patient has been a never smoker.Ultrasound on 05/30/2017 showed:Single enlarged lymph node in the right neck measures approximately 4.8 x 1.7 x 3.0 cm  2.Patient underwent IR guided lymph node biopsy of the cervical lymph node which showed metastatic squamous cell carcinoma. Staining for P 16 was positive and sox 10 was negative   3.PET/CT scan on 06/09/2017 showed intensely hypermetabolic enlarged right level 2 cervical lymph node with an SUV of 11.1. No clear site of primary malignancy within the head and neck. Potential site of investigation would include right base of tongue. There was asymmetric metabolic activity noted in that area with an SUV of 4.5. Less suspicious region in the posterior glottis. No evidence of distant metastatic disease.  4.Patient works as a Geophysicist/field seismologist at [employer]. Patient has had multiple allergies to medications in the past. Reports that when she took steroids in the past it made her quite aggravated. Her sister was hospitalized in a mental health facility after receiving a steroid course in the past.  5. Right tongue base biopsy positive for SCC. Baseline audiogram normal.  Concurrent chemo/RT completed on 09/05/17.   Interval history- patient has been dealing with acute anxiety issues since completing radiation which was 3 weeks ago. Reports that her right leg shakes all the time. Appetite is improving over last couple of weeks. Food still does not taste good. She feels anxious throughout the day but symptoms are better since last 2 weeks. She goes to gym daily for about 30 min. She has not yet seen a Retail banker. She is taking zoloft once a day. Ativan once during the day and once at night. She has been sleeping at night for few hours at a time. Denies any suicidal or homocidal ideations. Her main worry is if she is going to be able to get back to work in the future  ECOG PS- 1 Pain scale- 0  Review of systems- Review of Systems  Constitutional: Positive for malaise/fatigue. Negative for chills, fever and weight loss.       Lack of appetite and taste  HENT: Negative for congestion, ear discharge and nosebleeds.   Eyes: Negative for blurred vision.  Respiratory: Negative for cough, hemoptysis, sputum production, shortness of breath and wheezing.   Cardiovascular: Negative for chest pain, palpitations, orthopnea and claudication.  Gastrointestinal: Negative for abdominal pain, blood in stool, constipation, diarrhea, heartburn, melena, nausea and vomiting.  Genitourinary: Negative for dysuria, flank pain, frequency, hematuria and urgency.  Musculoskeletal: Negative for back pain, joint pain and myalgias.  Skin: Negative for rash.  Neurological: Negative for dizziness, tingling, focal weakness, seizures, weakness and headaches.  Endo/Heme/Allergies: Does not bruise/bleed easily.  Psychiatric/Behavioral: Negative for depression and suicidal ideas. The patient is nervous/anxious and has insomnia.  Allergies  Allergen Reactions  . Cefdinir Swelling    Swelling in mouth   . Cephalosporins Swelling  . Dairy Aid [Lactase] Swelling    Milk and milk products  cause swelling and tingling of tongue  . Flagyl [Metronidazole] Swelling    Mouth and throat and ears go red and burn  . Lactose Intolerance (Gi) Other (See Comments)    GI upset.  Eats only gluten free  . Penicillins Rash    Rash/hives  . Phenylephrine-Guaifenesin Anaphylaxis    Night terrors  . Sulfa Antibiotics Rash and Hives    Other reaction(s): Unknown   . Sulfonamide Derivatives Rash    Rash/itching.  From head to her toes, the itching was horrible. Penicillin is the worst  . Tape Rash    Adhesives all cause a problem severely.  It doesn't matter if it is paper tape or not.  Marland Kitchen Dexilant [Dexlansoprazole]     Pressure in head like head is in a vice and being squeezed  . Entex Lq [Phenylephrine-Guaifenesin]     Night terrors  . Other Other (See Comments)    Flu vaccine:  Weakness, dizziness, tia type symptoms.    . Oxycodone Nausea And Vomiting    And itching  . Succinylcholine Other (See Comments)    Trouble waking up  . Tetracycline Other (See Comments)    Unkown  . Codeine Nausea And Vomiting  . Guaifenesin & Derivatives Other (See Comments)    Night terrors  . Lactobacillus Itching  . Naproxen Sodium     Itching/rash  . Pseudoephedrine Rash  . Tetracyclines & Related Other (See Comments)    Was taking flagyl at the same time; unsure which caused the swelling  Swelling of throat. Pharmacist states that he believes it was the flagyl and not the tetracycline  . Wheat Bran Other (See Comments)    Upset stomach. Now eats gluten free     Past Medical History:  Diagnosis Date  . AA (aortic aneurysm) ([place])    a. 04/2011 Echo: Ao Root: 4.1cm, Asc Ao 4.7cm.  . Anxiety   . AV block, 2nd degree    a. 05/2005 - s/p MDT Adapta ADDR01 Dual Chamber PPM  . Bicuspid aortic valve   . Cancer ([place])   . Chest pain    a. 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  . Depression   . Expressive aphasia    a. ongoing since 04/2011 - seen by neurology - ? TIA vs. Migraine  . Facial numbness    a.  ongoing since 04/2011 - seen by neurology - ? TIA vs. Migraine  . GERD (gastroesophageal reflux disease)   . Low blood pressure   . Presence of permanent cardiac pacemaker   . 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 Syncope    a. 04/2011 Echo: EF 55-65%, No RWMA, Gr 1 DD.     Past Surgical History:  Procedure Laterality Date  . ABDOMINAL WALL MESH  REMOVAL    . bladder tack    . cataract Bilateral 2004  . CHOLECYSTECTOMY  2000  . DILATION AND CURETTAGE OF UTERUS    . DIRECT LARYNGOSCOPY Right 06/16/2017   Procedure: MICRO DIRECT LARYNGOSCOPY WITH BIOPSY OF RIGHT BASE OF TONGUE;  Surgeon: Carloyn Manner, MD;  Location: [facility];  Service: ENT;  Laterality: Right;  . EP IMPLANTABLE DEVICE N/A 06/04/2015   Procedure: PPM Generator Changeout;  Surgeon: Deboraha Sprang, MD;  Location: [place] [facility];  Service: Cardiovascular;  Laterality: N/A;  . ESOPHAGOGASTRODUODENOSCOPY (EGD) WITH PROPOFOL N/A 12/18/2015   Procedure: ESOPHAGOGASTRODUODENOSCOPY (EGD) WITH gastric biopsy and dilation;  Surgeon: Lucilla Lame, MD;  Location: [place];  Service: Endoscopy;  Laterality: N/A;  . EYE SURGERY    . INSERT / REPLACE / REMOVE PACEMAKER    . IR FLUORO GUIDE PORT INSERTION RIGHT  06/22/2017  . PACEMAKER INSERTION  2006   [employer]onic Adapta ADDR01  . TONSILLECTOMY    . TONSILLECTOMY    . VAGINAL HYSTERECTOMY  2013   mad/cope    Social History   Socioeconomic History  . Marital status: Married    Spouse name: Not on file  . Number of children: Not on file  . Years of education: Not on file  . Highest education level: Not on file  Social Needs  . Financial resource strain: Not on file  . Food insecurity - worry: Not on file  . Food insecurity - inability: Not on file  . Transportation needs - medical: Not on file  . Transportation needs - non-medical: Not on file  Occupational History  . Not on file  Tobacco Use  . Smoking status: Never Smoker    . Smokeless tobacco: Never Used  . Tobacco comment: tobacco use -no  Substance and Sexual Activity  . Alcohol use: No  . Drug use: No  . Sexual activity: Yes    Birth control/protection: Surgical  Other Topics Concern  . Not on file  Social History Narrative   Lives in [place] with husband.  Works out regularly.  Works as a Geophysicist/field seismologist - owns own business.     Family History  Problem Relation Age of Onset  . COPD Mother        alive @ 1  . Atrial fibrillation Mother   . Stroke Father        died @ 23  . Breast cancer Maternal Aunt 70  . COPD Maternal Grandmother   . Ovarian cancer Neg Hx   . Diabetes Neg Hx      Current Outpatient Medications:  .  aspirin 81 MG tablet, Take 81 mg by mouth daily., Disp: , Rfl:  .  b complex vitamins tablet, Take 1 tablet by mouth daily., Disp: , Rfl:  .  BLACK ELDERBERRY,BERRY-FLOWER, PO, Take 1 capsule by mouth daily., Disp: , Rfl:  .  CRANBERRY SOFT PO, Take 2 each by mouth daily. , Disp: , Rfl:  .  Digestive Enzymes (DIGESTIVE ENZYME PO), Take 1-2 capsules by mouth daily., Disp: , Rfl:  .  fexofenadine (ALLEGRA) 60 MG tablet, Take 60 mg by mouth daily as needed. , Disp: , Rfl:  .  fluconazole (DIFLUCAN) 100 MG tablet, Take 1 tablet (100 mg total) by mouth daily., Disp: 5 tablet, Rfl: 0 .  fluticasone (FLONASE SENSIMIST) 27.5 MCG/SPRAY nasal spray, Place 1 spray into the nose daily as needed. , Disp: , Rfl:  .  lidocaine-prilocaine (EMLA) cream, Apply to affected area once, Disp: 30 g, Rfl: 3 .  loratadine (CLARITIN) 10 MG tablet, Take 10 mg by mouth daily as needed. , Disp: , Rfl:  .  LORazepam (ATIVAN) 0.5 MG tablet, TAKE 1 TABLET BY MOUTH EVERY 6 HOURS AS NEEDED FOR NAUSEA AND VOMITING, Disp: 30 tablet, Rfl: 0 .  magic mouthwash SOLN, Take 5 mLs by mouth 4 (four) times daily as needed for mouth pain. Swish and swallow, Disp: 420 mL, Rfl: 1 .  nitroGLYCERIN (NITROSTAT) 0.4 MG SL  tablet, Place 1 tablet (0.4 mg total) under  the tongue every 5 (five) minutes as needed for chest pain., Disp: 25 tablet, Rfl: 3 .  NON FORMULARY, Take by mouth daily. Bone Up (calcium) , Disp: , Rfl:  .  Polyethyl Glycol-Propyl Glycol (SYSTANE) 0.4-0.3 % GEL ophthalmic gel, Place 1 application into both eyes daily as needed., Disp: , Rfl:  .  PRESCRIPTION MEDICATION, Place 1 tablet vaginally 3 (three) times a week. E3 1mg  and Testosterone 1mg  compound, Disp: , Rfl:  .  sertraline (ZOLOFT) 25 MG tablet, Take 1 tablet (25 mg total) by mouth daily., Disp: 30 tablet, Rfl: 0 .  HYDROcodone-acetaminophen (HYCET) 7.5-325 mg/15 ml solution, Take 10 mLs by mouth 4 (four) times daily as needed for moderate pain. (Patient not taking: Reported on 09/22/2017), Disp: 300 mL, Rfl: 0 .  ondansetron (ZOFRAN) 8 MG tablet, Take 1 tablet (8 mg total) by mouth 2 (two) times daily as needed. Start on the third day after chemotherapy. (Patient not taking: Reported on 09/22/2017), Disp: 30 tablet, Rfl: 1 .  prochlorperazine (COMPAZINE) 10 MG tablet, Take 1 tablet (10 mg total) by mouth every 6 (six) hours as needed (Nausea or vomiting). (Patient not taking: Reported on 09/22/2017), Disp: 30 tablet, Rfl: 1 .  sucralfate (CARAFATE) 1 g tablet, Take 1 tablet (1 g total) by mouth 3 (three) times daily. Dissolve in 2-3 tbsp warm water, swish and swallow. (Patient not taking: Reported on 09/15/2017), Disp: 90 tablet, Rfl: 3 No current facility-administered medications for this visit.   Facility-Administered Medications Ordered in Other Visits:  .  0.9 %  sodium chloride infusion, , Intravenous, Once, Sindy Guadeloupe, MD  Physical exam:  Vitals:   09/22/17 1037  BP: 104/72  Pulse: 83  Resp: 18  Temp: (!) 97.2 F (36.2 C)  TempSrc: Tympanic  Weight: 156 lb 6 oz (70.9 kg)   Physical Exam  Constitutional: She is oriented to person, place, and time.  Appears fatigued. Does not appear to be in acute distress  HENT:  Head: Normocephalic and atraumatic.  Eyes: EOM are  normal. Pupils are equal, round, and reactive to light.  Neck: Normal range of motion.  Cardiovascular: Normal rate, regular rhythm and normal heart sounds.  Pulmonary/Chest: Effort normal and breath sounds normal.  Abdominal: Soft. Bowel sounds are normal.  Neurological: She is alert and oriented to person, place, and time.  There is constant shaking noted of RLE. It stops temporarily when she is distracted and then starts again. senssation in b/l LE and position sense is normal  Skin: Skin is warm and dry.     CMP Latest Ref Rng & Units 09/13/2017  Glucose 65 - 99 mg/dL 93  BUN 6 - 20 mg/dL 13  Creatinine 0.44 - 1.00 mg/dL 0.70  Sodium 135 - 145 mmol/L 141  Potassium 3.5 - 5.1 mmol/L 3.9  Chloride 101 - 111 mmol/L 109  CO2 22 - 32 mmol/L 25  Calcium 8.9 - 10.3 mg/dL 8.8(L)  Total Protein 6.5 - 8.1 g/dL 6.0(L)  Total Bilirubin 0.3 - 1.2 mg/dL 0.3  Alkaline Phos 38 - 126 U/L 82  AST 15 - 41 U/L 17  ALT 14 - 54 U/L 16   CBC Latest Ref Rng & Units 09/22/2017  WBC 3.6 - 11.0 K/uL 5.9  Hemoglobin 12.0 - 16.0 g/dL 9.5(L)  Hematocrit 35.0 - 47.0 % 27.9(L)  Platelets 150 - 440 K/uL 161    Assessment and plan- Patient is a 62 y.o. 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 Anxiety- again emphasized the importance of seeing a counselor in addition to medical management. We had also offered psychiatry evaluation in the past and she wanted to hold off on that. Encouraged her to focus her mind on positive thoughts., keep herself active to the best of her ability. Low energy, fatigue and lack of taste and appetite is still expected at this time frame 3 weeks post treatment and it will take few more weeks to start feeling better. Continue zoloft and prn ativan. Her leg movements are likely due to anxiety and not neuropathy. She denies any pain/ tingling numbness in her extremities. Sensory/motor/ proprioception normal  in lower extremites. She would like IV fluids today and will get 1L. Clinically she does not appear to be dehydrated  2. Anemia- likely due to chemotherapy. Continue to monitor. Likely to improve over next 1-2 months  3. oropharyngeal cancer- repeat PET CT around 10/19/17. I will discuss her case at tumor board and see her thereafter in 1 months time. Clinically she has responded well and there is no palpable adenopathy on todays exam  She will call us in the interim if she has any questions or concerns    Visit Diagnosis 1. Squamous cell carcinoma of oropharynx ([place])   2. Anxiety      Dr. Randa Evens, MD, MPH [facility] Pager- [phone] 09/23/2017 8:53 AM

## 2017-09-26 ENCOUNTER — Telehealth: Payer: Self-pay | Admitting: Cardiovascular Disease

## 2017-09-26 ENCOUNTER — Ambulatory Visit: Payer: BLUE CROSS/BLUE SHIELD

## 2017-09-26 ENCOUNTER — Ambulatory Visit
Admission: RE | Admit: 2017-09-26 | Discharge: 2017-09-26 | Disposition: A | Discharge status: Home / Self Care | Payer: BLUE CROSS/BLUE SHIELD | Source: Ambulatory Visit | Attending: Oncology | Admitting: Oncology

## 2017-09-26 ENCOUNTER — Other Ambulatory Visit: Payer: Self-pay | Admitting: *Deleted

## 2017-09-26 ENCOUNTER — Telehealth: Payer: Self-pay | Admitting: *Deleted

## 2017-09-26 DIAGNOSIS — M7989 Other specified soft tissue disorders: Secondary | ICD-10-CM

## 2017-09-26 DIAGNOSIS — I82812 Embolism and thrombosis of superficial veins of left lower extremities: Secondary | ICD-10-CM | POA: Diagnosis not present

## 2017-09-26 DIAGNOSIS — C109 Malignant neoplasm of oropharynx, unspecified: Secondary | ICD-10-CM

## 2017-09-26 DIAGNOSIS — I82432 Acute embolism and thrombosis of left popliteal vein: Secondary | ICD-10-CM | POA: Diagnosis not present

## 2017-09-26 DIAGNOSIS — I82402 Acute embolism and thrombosis of unspecified deep veins of left lower extremity: Secondary | ICD-10-CM | POA: Diagnosis not present

## 2017-09-26 DIAGNOSIS — I82412 Acute embolism and thrombosis of left femoral vein: Secondary | ICD-10-CM | POA: Insufficient documentation

## 2017-09-26 MED ORDER — RIVAROXABAN 15 MG PO TABS: 15.0000 mg | ORAL_TABLET | Freq: Two times a day (BID) | ORAL | 0 refills | Status: DC

## 2017-09-26 NOTE — Telephone Encounter (Signed)
Patient called to report that her right leg is swollen from knee down and she is having pain in calf up to knee and into her thigh as well. States the swelling goes down a little at night, but not completely away Asking what she is to do about it. Please advise

## 2017-09-26 NOTE — Telephone Encounter (Signed)
Line busy and sent message to Dr. Rockey Situ.

## 2017-09-26 NOTE — Telephone Encounter (Signed)
Let us get a doppler USG of her right leg and if she wants to be seen after that NP can see her

## 2017-09-26 NOTE — Telephone Encounter (Signed)
Sherri with Cancer center in [place] calling stating pt had some swelling in left leg They did an ultra sound and she does have DVT in left leg  They are calling to see if they can go ahead and place patient blood thinner xarelto   Please call back      If we call after 4:30 pm please call Dr Janese Banks   Dr Janese Banks [phone]

## 2017-09-26 NOTE — Telephone Encounter (Signed)
Patient has swelling of left LE and not the right. Please reenter order for correct leg

## 2017-09-26 NOTE — Telephone Encounter (Signed)
Got a call from [place] in u/s that pt does have a DVT of left lower ext. Starts at left femoral to left calf vein and to left shorter saphenous vein. I spoke to Janese Banks and she said to start her on xarelto. I called it into [facility] and the cost is 45 dollars. Called pt to let her know the results and the medication to start on

## 2017-09-26 NOTE — Telephone Encounter (Signed)
Message sent to scheduling for Doppler to be ordered

## 2017-09-26 NOTE — Telephone Encounter (Signed)
Spoke with Sherri over at Cancer center and she reports that patient was positive for DVT. Made her aware that physician has left for today but that I will send him text and message. She was appreciative for the call with no further questions. Made Sherri aware that I will be in touch with her tomorrow at [place] phone [phone].

## 2017-09-26 NOTE — Telephone Encounter (Signed)
Patient was called by scheduling with appt

## 2017-09-26 NOTE — Progress Notes (Signed)
Called pt and she has a aortic aneursysm and I need to check that with her Cardiologist to see if that is ok to take blood thinners and I would let pt know.  I got a call from md office that gollan has left for the day and nurse did send him a message to see if he would call back to [employer].  I called Janese Banks and she had a minute to review the records from Dr. Rockey Situ and her aneursym has been stable for 9 years,. She wants pt to start Golden and will get a response from [facility] in am.  I called pt back and she was agreeable to above and will go to pharmacy and pick med up and take tonight with food. I will call her tom with news

## 2017-09-27 ENCOUNTER — Telehealth: Payer: Self-pay | Admitting: *Deleted

## 2017-09-27 NOTE — Telephone Encounter (Signed)
Spoke with Sherri at the [facility] and they have been in touch with patient regarding medications and have no further needs at this time.

## 2017-09-27 NOTE — Telephone Encounter (Signed)
Patient called to report that she had not "heard" anything from Dr. Elroy Channel office. She requested a call back from [place].

## 2017-09-27 NOTE — Telephone Encounter (Signed)
Called pt earlier today to let her know that she does need to stay on xarelto.  She took a dose last night and this am until we got answer from Dr. Rockey Situ office. Dr. Rockey Situ told Wendy Kelly that she should go on xarelto . Patient nervous about this. She has confidence in gollan so she will take 15 mg bid x 21 days and we will then change her to 20 mg daily.  She will cont. ASA. And will have appt to see md Friday at 11:45. She was told that she can go to gym but needs to walk and no bicycle and leg wt for right now and upper body exercise is ok. She is agreeable to plan

## 2017-09-27 NOTE — Telephone Encounter (Signed)
Spoke with Wendy Kelly and she states that they did not get to talk about everything and requests call back.

## 2017-09-27 NOTE — Telephone Encounter (Signed)
Sherri from [facility] is calling in regards to patient Would like to know of status, needs to get in touch with patient soon Please advise

## 2017-09-27 NOTE — Telephone Encounter (Signed)
Patient calling to see if Dr. Rockey Situ has information to call Dr. Janese Banks, advised that we do and waiting for him to come out of room in order to give him phone number. Let her know that I would give her a call back once he calls cancer center.

## 2017-09-27 NOTE — Telephone Encounter (Signed)
Spoke with Venida Jarvis and she reports that physician is still waiting to hear back from Dr. Rockey Situ. Advised that I would give him the note when he comes out of the next room.

## 2017-09-27 NOTE — Telephone Encounter (Signed)
Spoke with patient and made her aware that Dr. Rockey Situ did call and speak with Dr. Janese Banks. She was appreciative for the call with no further questions.

## 2017-09-28 ENCOUNTER — Other Ambulatory Visit: Payer: Self-pay | Admitting: *Deleted

## 2017-09-28 DIAGNOSIS — I82412 Acute embolism and thrombosis of left femoral vein: Secondary | ICD-10-CM

## 2017-09-28 NOTE — Telephone Encounter (Signed)
Patient ants to talk to Dr. Rockey Situ about this blood clot.  Is there anyway he would call her to discuss.

## 2017-09-28 NOTE — Telephone Encounter (Signed)
Spoke with patient and she really wants Dr. Rockey Situ to call her. She states that she wants to hear it from him and has some specific questions. Tried to inquire if there was something I could answer for her or pass on to Dr. Rockey Situ but she insisted that she would like to talk with him. Advised that he is seeing patients all day and his schedule is pretty full and that I would be happy to pass on question or message. She again persisted that he call her to discuss and answer her concerns. Advised that I would make him aware and will be in touch. She verbalized understanding with no further questions.

## 2017-09-28 NOTE — Progress Notes (Signed)
[facility]  

## 2017-09-29 ENCOUNTER — Telehealth: Payer: Self-pay

## 2017-09-29 NOTE — Telephone Encounter (Signed)
Left voicemail message to call back  

## 2017-09-29 NOTE — Telephone Encounter (Signed)
Nutrition Follow-up:  Called patient for nutrition follow-up. Patient has completed radiation and chemotherapy treatment.  Patient reports currently has blood clot and taking blood thinners.  Concerned about this.    Reports that over the past week her appetite has picked up and she is trying different solid foods.  Reports mouth is still dry especially in the mornings.  Reports she is drinking more fluids but knows she still needs to drink more.  Reports that she is eating eggs, oatmeal or cereal with almond milk for breakfast most mornings.  Also has done well with chicken salad, deviled eggs, pinto beans, rice, turkey/ham, chicken thighs, halos, grapes, cheese, soft cooked carrots and green beans.  Breads and potatoes still don't taste "right".  Continues to drink [employer] but not drinking very many as weight has remained stable per patient on home scales and eating more solid foods.    Medications: reviewed  Labs: reviewed  Anthropometrics:   Weight 156 noted on 3/14 decreased from 158 lb on 2/28.     NUTRITION DIAGNOSIS: predicted suboptimal energy intake improving   MALNUTRITION DIAGNOSIS: severe malnutrition improving   INTERVENTION:   Congratulated patient on doing well with solid foods.   Encouraged patient to continue to try soft, moist foods especially with dry mouth symptoms.  Discussed food options.   Patient feels that she has enough [employer] and will not need to order more.  Will have Dr. Janese Banks discontinue order to [facility] home [facility].  Encouraged patient to continue to drink shakes in addition to eating solid foods to prevent further weight loss and aide in recovery.     MONITORING, EVALUATION, GOAL: weight trends, intake   NEXT VISIT: as needed, patient to contact  Joette Schmoker B. [name], [place], [place] Registered Dietitian [phone] (pager)

## 2017-09-30 ENCOUNTER — Inpatient Hospital Stay (HOSPITAL_BASED_OUTPATIENT_CLINIC_OR_DEPARTMENT_OTHER): Payer: BLUE CROSS/BLUE SHIELD | Admitting: Oncology

## 2017-09-30 ENCOUNTER — Encounter: Payer: Self-pay | Admitting: Oncology

## 2017-09-30 ENCOUNTER — Other Ambulatory Visit: Payer: Self-pay | Admitting: Oncology

## 2017-09-30 VITALS — BP 105/71 | HR 83 | Temp 97.3°F | Resp 18 | Wt 156.5 lb

## 2017-09-30 DIAGNOSIS — R5383 Other fatigue: Secondary | ICD-10-CM

## 2017-09-30 DIAGNOSIS — Z79899 Other long term (current) drug therapy: Secondary | ICD-10-CM

## 2017-09-30 DIAGNOSIS — R110 Nausea: Secondary | ICD-10-CM | POA: Diagnosis not present

## 2017-09-30 DIAGNOSIS — D649 Anemia, unspecified: Secondary | ICD-10-CM | POA: Diagnosis not present

## 2017-09-30 DIAGNOSIS — K219 Gastro-esophageal reflux disease without esophagitis: Secondary | ICD-10-CM | POA: Diagnosis not present

## 2017-09-30 DIAGNOSIS — I82412 Acute embolism and thrombosis of left femoral vein: Secondary | ICD-10-CM

## 2017-09-30 DIAGNOSIS — Z923 Personal history of irradiation: Secondary | ICD-10-CM | POA: Diagnosis not present

## 2017-09-30 DIAGNOSIS — C109 Malignant neoplasm of oropharynx, unspecified: Secondary | ICD-10-CM

## 2017-09-30 DIAGNOSIS — I82402 Acute embolism and thrombosis of unspecified deep veins of left lower extremity: Secondary | ICD-10-CM | POA: Diagnosis not present

## 2017-09-30 DIAGNOSIS — B379 Candidiasis, unspecified: Secondary | ICD-10-CM | POA: Diagnosis not present

## 2017-09-30 DIAGNOSIS — Z85818 Personal history of malignant neoplasm of other sites of lip, oral cavity, and pharynx: Secondary | ICD-10-CM | POA: Diagnosis not present

## 2017-09-30 DIAGNOSIS — I714 Abdominal aortic aneurysm, without rupture: Secondary | ICD-10-CM

## 2017-09-30 DIAGNOSIS — E860 Dehydration: Secondary | ICD-10-CM

## 2017-09-30 DIAGNOSIS — Z9221 Personal history of antineoplastic chemotherapy: Secondary | ICD-10-CM

## 2017-09-30 DIAGNOSIS — F4322 Adjustment disorder with anxiety: Secondary | ICD-10-CM

## 2017-09-30 DIAGNOSIS — C760 Malignant neoplasm of head, face and neck: Secondary | ICD-10-CM

## 2017-09-30 DIAGNOSIS — F419 Anxiety disorder, unspecified: Secondary | ICD-10-CM

## 2017-09-30 DIAGNOSIS — Z7901 Long term (current) use of anticoagulants: Secondary | ICD-10-CM

## 2017-09-30 NOTE — Progress Notes (Signed)
No new changes noted today 

## 2017-09-30 NOTE — Telephone Encounter (Signed)
I talked to her on the phone

## 2017-10-03 ENCOUNTER — Ambulatory Visit (INDEPENDENT_AMBULATORY_CARE_PROVIDER_SITE_OTHER): Payer: BLUE CROSS/BLUE SHIELD | Admitting: Vascular Surgery

## 2017-10-03 ENCOUNTER — Encounter (INDEPENDENT_AMBULATORY_CARE_PROVIDER_SITE_OTHER): Payer: Self-pay | Admitting: Vascular Surgery

## 2017-10-03 VITALS — BP 118/73 | HR 74 | Resp 16 | Ht 64.0 in | Wt 153.4 lb

## 2017-10-03 DIAGNOSIS — I82402 Acute embolism and thrombosis of unspecified deep veins of left lower extremity: Secondary | ICD-10-CM

## 2017-10-03 DIAGNOSIS — I82492 Acute embolism and thrombosis of other specified deep vein of left lower extremity: Secondary | ICD-10-CM

## 2017-10-03 DIAGNOSIS — C109 Malignant neoplasm of oropharynx, unspecified: Secondary | ICD-10-CM

## 2017-10-03 HISTORY — DX: Acute embolism and thrombosis of unspecified deep veins of left lower extremity: I82.402

## 2017-10-03 NOTE — Progress Notes (Signed)
Subjective:    Patient ID: Wendy Kelly, female    DOB: Aug 10, 1955, 62 y.o.   MRN: [id number] Chief Complaint  Patient presents with  . New Patient (Initial Visit)    ref Janese Banks for dvt   Presents as a new patient referred by Dr. Janese Banks for evaluation of left lower extremity DVT.  Patient was seen with her husband.  The patient endorses a history of experiencing an acute swelling to the left calf approximately 1 week ago.  The patient called her primary care physician who had ordered an ultrasound the patient was found to have Positive for deep venous thrombosis in the left lower extremity. There is thrombus extending from the proximal left femoral vein to the left calf veins. Superficial venous thrombosis involving the left short saphenous vein on 09/26/17.  The patient was placed on Xarelto at that time.  The patient denies any left lower extremity pain.  The patient denies any shortness of breath or chest pain.  The patient denies any trauma to the left lower extremity.  The patient denies any genetic clotting disorder.  The patient does note that due to her recent cancer diagnosis she was "laying in bed for long periods of time".  Patient denies any fever, nausea vomiting.  Review of Systems  Constitutional: Negative.   HENT: Negative.   Eyes: Negative.   Respiratory: Negative.   Cardiovascular: Positive for leg swelling.       LLE DVT  Gastrointestinal: Negative.   Endocrine: Negative.   Genitourinary: Negative.   Musculoskeletal: Negative.   Skin: Negative.   Allergic/Immunologic: Negative.   Neurological: Negative.   Hematological: Negative.   Psychiatric/Behavioral: Negative.       Objective:   Physical Exam  Constitutional: She appears well-developed and well-nourished. No distress.  HENT:  Head: Normocephalic and atraumatic.  Eyes: Pupils are equal, round, and reactive to light. Conjunctivae are normal.  Neck: Normal range of motion.  Cardiovascular: Normal rate, regular  rhythm, normal heart sounds and intact distal pulses.  Pulses:      Radial pulses are 2+ on the right side, and 2+ on the left side.       Dorsalis pedis pulses are 2+ on the right side, and 2+ on the left side.       Posterior tibial pulses are 2+ on the right side, and 2+ on the left side.  Left lower extremity: Calf soft, thigh soft,.  Calf nontender to palpation.  No pain with dorsiflexion.  Skin is intact.  There is no cellulitis.  Pulmonary/Chest: Effort normal and breath sounds normal.  Musculoskeletal: Normal range of motion. She exhibits edema (Left lower extremity mild to moderate nonpitting edema).  Skin: Skin is warm and dry. She is not diaphoretic.  Psychiatric: She has a normal mood and affect. Her behavior is normal. Judgment and thought content normal.  Vitals reviewed.  BP 118/73 (BP Location: Right Arm)   Pulse 74   Resp 16   Ht 5\' 4"  (1.626 m)   Wt 153 lb 6.4 oz (69.6 kg)   BMI 26.33 kg/m   Past Medical History:  Diagnosis Date  . AA (aortic aneurysm) ([place])    a. 04/2011 Echo: Ao Root: 4.1cm, Asc Ao 4.7cm.  . Anxiety   . AV block, 2nd degree    a. 05/2005 - s/p MDT Adapta ADDR01 Dual Chamber PPM  . Bicuspid aortic valve   . Cancer ([place])   . Chest pain    a. 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  Marland Kitchen  Depression   . Expressive aphasia    a. ongoing since 04/2011 - seen by neurology - ? TIA vs. Migraine  . Facial numbness    a. ongoing since 04/2011 - seen by neurology - ? TIA vs. Migraine  . GERD (gastroesophageal reflux disease)   . Low blood pressure   . Presence of permanent cardiac pacemaker   . 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 Syncope    a. 04/2011 Echo: EF 55-65%, No RWMA, Gr 1 DD.   Social History   Socioeconomic History  . Marital status: Married    Spouse name: Not on file  . Number of children: Not on file  . Years of education: Not on file  . Highest education level: Not on file  Occupational History  . Not on file    Social Needs  . Financial resource strain: Not on file  . Food insecurity:    Worry: Not on file    Inability: Not on file  . Transportation needs:    Medical: Not on file    Non-medical: Not on file  Tobacco Use  . Smoking status: Never Smoker  . Smokeless tobacco: Never Used  . Tobacco comment: tobacco use -no  Substance and Sexual Activity  . Alcohol use: No  . Drug use: No  . Sexual activity: Yes    Birth control/protection: Surgical  Lifestyle  . Physical activity:    Days per week: Not on file    Minutes per session: Not on file  . Stress: Not on file  Relationships  . Social connections:    Talks on phone: Not on file    Gets together: Not on file    Attends religious service: Not on file    Active member of club or organization: Not on file    Attends meetings of clubs or organizations: Not on file    Relationship status: Not on file  . Intimate partner violence:    Fear of current or ex partner: Not on file    Emotionally abused: Not on file    Physically abused: Not on file    Forced sexual activity: Not on file  Other Topics Concern  . Not on file  Social History Narrative   Lives in [place] with husband.  Works out regularly.  Works as a Geophysicist/field seismologist - owns own business.    Past Surgical History:  Procedure Laterality Date  . ABDOMINAL WALL MESH  REMOVAL    . bladder tack    . cataract Bilateral 2004  . CHOLECYSTECTOMY  2000  . DILATION AND CURETTAGE OF UTERUS    . DIRECT LARYNGOSCOPY Right 06/16/2017   Procedure: MICRO DIRECT LARYNGOSCOPY WITH BIOPSY OF RIGHT BASE OF TONGUE;  Surgeon: Carloyn Manner, MD;  Location: [facility];  Service: ENT;  Laterality: Right;  . EP IMPLANTABLE DEVICE N/A 06/04/2015   Procedure: PPM Generator Changeout;  Surgeon: Deboraha Sprang, MD;  Location: [facility];  Service: Cardiovascular;  Laterality: N/A;  . ESOPHAGOGASTRODUODENOSCOPY (EGD) WITH PROPOFOL N/A 12/18/2015   Procedure:  ESOPHAGOGASTRODUODENOSCOPY (EGD) WITH gastric biopsy and dilation;  Surgeon: Lucilla Lame, MD;  Location: [facility];  Service: Endoscopy;  Laterality: N/A;  . EYE SURGERY    . INSERT / REPLACE / REMOVE PACEMAKER    . IR FLUORO GUIDE PORT INSERTION RIGHT  06/22/2017  . PACEMAKER INSERTION  2006   [employer] Adapta ADDR01  . TONSILLECTOMY    .  TONSILLECTOMY    . VAGINAL HYSTERECTOMY  2013   mad/cope   Family History  Problem Relation Age of Onset  . COPD Mother        alive @ 92  . Atrial fibrillation Mother   . Stroke Father        died @ 20  . Breast cancer Maternal Aunt 70  . COPD Maternal Grandmother   . Ovarian cancer Neg Hx   . Diabetes Neg Hx    Allergies  Allergen Reactions  . Cefdinir Swelling    Swelling in mouth   . Cephalosporins Swelling  . Dairy Aid [Lactase] Swelling    Milk and milk products cause swelling and tingling of tongue  . Flagyl [Metronidazole] Swelling    Mouth and throat and ears go red and burn  . Lactose Intolerance (Gi) Other (See Comments)    GI upset.  Eats only gluten free  . Penicillins Rash    Rash/hives  . Phenylephrine-Guaifenesin Anaphylaxis    Night terrors  . Sulfa Antibiotics Rash and Hives    Other reaction(s): Unknown   . Sulfonamide Derivatives Rash    Rash/itching.  From head to her toes, the itching was horrible. Penicillin is the worst  . Tape Rash    Adhesives all cause a problem severely.  It doesn't matter if it is paper tape or not.  Marland Kitchen Dexilant [Dexlansoprazole]     Pressure in head like head is in a vice and being squeezed  . Entex Lq [Phenylephrine-Guaifenesin]     Night terrors  . Other Other (See Comments)    Flu vaccine:  Weakness, dizziness, tia type symptoms.    . Oxycodone Nausea And Vomiting    And itching  . Succinylcholine Other (See Comments)    Trouble waking up  . Tetracycline Other (See Comments)    Unkown  . Codeine Nausea And Vomiting  . Guaifenesin & Derivatives Other (See Comments)     Night terrors  . Lactobacillus Itching  . Naproxen Sodium     Itching/rash  . Pseudoephedrine Rash  . Tetracyclines & Related Other (See Comments)    Was taking flagyl at the same time; unsure which caused the swelling  Swelling of throat. Pharmacist states that he believes it was the flagyl and not the tetracycline  . Wheat Bran Other (See Comments)    Upset stomach. Now eats gluten free      Assessment & Plan:  Presents as a new patient referred by Dr. Janese Banks for evaluation of left lower extremity DVT.  Patient was seen with her husband.  The patient endorses a history of experiencing an acute swelling to the left calf approximately 1 week ago.  The patient called her primary care physician who had ordered an ultrasound the patient was found to have Positive for deep venous thrombosis in the left lower extremity. There is thrombus extending from the proximal left femoral vein to the left calf veins. Superficial venous thrombosis involving the left short saphenous vein on 09/26/17.  The patient was placed on Xarelto at that time.  The patient denies any left lower extremity pain.  The patient denies any shortness of breath or chest pain.  The patient denies any trauma to the left lower extremity.  The patient denies any genetic clotting disorder.  The patient does note that due to her recent cancer diagnosis she was "laying in bed for long periods of time".  Patient denies any fever, nausea vomiting.  1. Deep vein thrombosis (  DVT) of other vein of left lower extremity, unspecified chronicity ([place]) - New Patient with recent diagnosis of DVT and SVT to the left lower extremity Patient is on Xarelto daily The patient denies any shortness of breath or chest pain The patient denies any left lower extremity pain.  The patient does experience some swelling but this does not cause any discomfort Physical exam is unremarkable except for some mild to moderate nonpitting edema to the left calf There is no  indication for IVC filter placement of thrombolysis at this time Patient is to follow-up in 1 week for a DVT study to assess for any propagation  - VAS [place] LOWER EXTREMITY VENOUS (DVT); Future  2. 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 aspirin 81 MG tablet Take 81 mg by mouth daily.    Marland Kitchen b complex vitamins tablet Take 1 tablet by mouth daily.    Marland Kitchen BLACK ELDERBERRY,BERRY-FLOWER, PO Take 1 capsule by mouth daily.    Marland Kitchen CRANBERRY SOFT PO Take 2 each by mouth daily.     . Digestive Enzymes (DIGESTIVE ENZYME PO) Take 1-2 capsules by mouth daily.    . fexofenadine (ALLEGRA) 60 MG tablet Take 60 mg by mouth daily as needed.     . fluticasone (FLONASE SENSIMIST) 27.5 MCG/SPRAY nasal spray Place 1 spray into the nose daily as needed.     . lidocaine-prilocaine (EMLA) cream Apply to affected area once 30 g 3  . loratadine (CLARITIN) 10 MG tablet Take 10 mg by mouth daily as needed.     Marland Kitchen LORazepam (ATIVAN) 0.5 MG tablet TAKE 1 TABLET BY MOUTH EVERY 6 HOURS AS NEEDED FOR NAUSEA AND VOMITING 30 tablet 0  . NON FORMULARY Take by mouth daily. Bone Up (calcium)     . Polyethyl Glycol-Propyl Glycol (SYSTANE) 0.4-0.3 % GEL ophthalmic gel Place 1 application into both eyes daily as needed.    Marland Kitchen PRESCRIPTION MEDICATION Place 1 tablet vaginally 3 (three) times a week. E3 1mg  and Testosterone 1mg  compound    . Rivaroxaban (XARELTO) 15 MG TABS tablet Take 1 tablet (15 mg total) by mouth 2 (two) times daily with a meal. 42 tablet 0  . sertraline (ZOLOFT) 25 MG tablet Take 1 tablet (25 mg total) by mouth daily. 30 tablet 0  . fluconazole (DIFLUCAN) 100 MG tablet Take 1 tablet (100 mg total) by mouth daily. (Patient not taking: Reported on 09/30/2017)  5 tablet 0  . HYDROcodone-acetaminophen (HYCET) 7.5-325 mg/15 ml solution Take 10 mLs by mouth 4 (four) times daily as needed for moderate pain. (Patient not taking: Reported on 09/22/2017) 300 mL 0  . magic mouthwash SOLN Take 5 mLs by mouth 4 (four) times daily as needed for mouth pain. Swish and swallow (Patient not taking: Reported on 09/30/2017) 420 mL 1  . nitroGLYCERIN (NITROSTAT) 0.4 MG SL tablet Place 1 tablet (0.4 mg total) under the tongue every 5 (five) minutes as needed for chest pain. (Patient not taking: Reported on 09/30/2017) 25 tablet 3  . ondansetron (ZOFRAN) 8 MG tablet Take 1 tablet (8 mg total) by mouth 2 (two) times daily as needed. Start on the third day after chemotherapy. (Patient not taking: Reported on 09/22/2017) 30 tablet 1  . prochlorperazine (COMPAZINE) 10  MG tablet Take 1 tablet (10 mg total) by mouth every 6 (six) hours as needed (Nausea or vomiting). (Patient not taking: Reported on 09/22/2017) 30 tablet 1  . sucralfate (CARAFATE) 1 g tablet Take 1 tablet (1 g total) by mouth 3 (three) times daily. Dissolve in 2-3 tbsp warm water, swish and swallow. (Patient not taking: Reported on 09/15/2017) 90 tablet 3   Current Facility-Administered Medications on File Prior to Visit  Medication Dose Route Frequency Provider Last Rate Last Dose  . 0.9 %  sodium chloride infusion   Intravenous Once Sindy Guadeloupe, MD       There are no Patient Instructions on file for this visit. No follow-ups on file.  KIMBERLY A STEGMAYER, PA-C

## 2017-10-03 NOTE — Progress Notes (Signed)
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 Consuela Mimes evaluation of right neck mass. The mass has been present for or a year and has been growing gradually. It was associated with some right ear pain but no fever. Patient has been a never smoker.Ultrasound on 05/30/2017 showed:Single enlarged lymph node in the right neck measures approximately 4.8 x 1.7 x 3.0 cm  2.Patient underwent IR guided lymph node biopsy of the cervical lymph node which showed metastatic squamous cell carcinoma. Staining for P 16 was positive and sox 10 was negative   3.PET/CT scan on 06/09/2017 showed intensely hypermetabolic enlarged right level 2 cervical lymph node with an SUV of 11.1. No clear site of primary malignancy within the head and neck. Potential site of investigation would include right base of tongue. There was asymmetric metabolic activity noted in that area with an SUV of 4.5. Less suspicious region in the posterior glottis. No evidence of distant metastatic disease.  4.Patient works as a Geophysicist/field seismologist at [employer]. Patient has had multiple allergies to medications in the past. Reports that when she took steroids in the past it made her quite aggravated. Her sister was hospitalized in a mental health facility after receiving a steroid course in the past.  5. Right tongue base biopsy positive for SCC. Baseline audiogram normal. Concurrent chemo/RT  completed on 09/05/17.    Interval history- Patient reported pain and swelling of her left leg on on 09/26/17 which showed acute DVT LLE. She is on xarelto for the same. She has not had any bleeding issues  Anxiety- she still has feelings of anxiety. She has not seen her therapist. She is currently on prn ativan and zoloft. Overall she reports feeling better.   appetite and taste sensation is improving  ECOG PS- 1 Pain scale-0   Review of systems- Review of Systems  Constitutional: Positive for malaise/fatigue. Negative for chills, fever and weight loss.  HENT: Negative for congestion, ear discharge and nosebleeds.   Eyes: Negative for blurred vision.  Respiratory: Negative for cough, hemoptysis, sputum production, shortness of breath and wheezing.   Cardiovascular: Positive for leg swelling. Negative for chest pain, palpitations, orthopnea and claudication.  Gastrointestinal: Negative for abdominal pain, blood in stool, constipation, diarrhea, heartburn, melena, nausea and vomiting.  Genitourinary: Negative for dysuria, flank pain, frequency, hematuria and urgency.  Musculoskeletal: Negative for back pain, joint pain and myalgias.  Skin: Negative for rash.  Neurological: Negative for dizziness, tingling, focal weakness, seizures, weakness and headaches.  Endo/Heme/Allergies: Does not bruise/bleed easily.  Psychiatric/Behavioral: Negative for depression and suicidal ideas. The patient is nervous/anxious. The patient does not have insomnia.       Allergies  Allergen Reactions  . Cefdinir Swelling    Swelling in mouth   . Cephalosporins Swelling  . Dairy Aid [Lactase] Swelling    Milk and milk products cause swelling and tingling of tongue  . Flagyl [Metronidazole] Swelling    Mouth and throat and ears go red and burn  .  Lactose Intolerance (Gi) Other (See Comments)    GI upset.  Eats only gluten free  . Penicillins Rash    Rash/hives  . Phenylephrine-Guaifenesin Anaphylaxis      Night terrors  . Sulfa Antibiotics Rash and Hives    Other reaction(s): Unknown   . Sulfonamide Derivatives Rash    Rash/itching.  From head to her toes, the itching was horrible. Penicillin is the worst  . Tape Rash    Adhesives all cause a problem severely.  It doesn't matter if it is paper tape or not.  Marland Kitchen Dexilant [Dexlansoprazole]     Pressure in head like head is in a vice and being squeezed  . Entex Lq [Phenylephrine-Guaifenesin]     Night terrors  . Other Other (See Comments)    Flu vaccine:  Weakness, dizziness, tia type symptoms.    . Oxycodone Nausea And Vomiting    And itching  . Succinylcholine Other (See Comments)    Trouble waking up  . Tetracycline Other (See Comments)    Unkown  . Codeine Nausea And Vomiting  . Guaifenesin & Derivatives Other (See Comments)    Night terrors  . Lactobacillus Itching  . Naproxen Sodium     Itching/rash  . Pseudoephedrine Rash  . Tetracyclines & Related Other (See Comments)    Was taking flagyl at the same time; unsure which caused the swelling  Swelling of throat. Pharmacist states that he believes it was the flagyl and not the tetracycline  . Wheat Bran Other (See Comments)    Upset stomach. Now eats gluten free     Past Medical History:  Diagnosis Date  . AA (aortic aneurysm) ([place])    a. 04/2011 Echo: Ao Root: 4.1cm, Asc Ao 4.7cm.  . Anxiety   . AV block, 2nd degree    a. 05/2005 - s/p MDT Adapta ADDR01 Dual Chamber PPM  . Bicuspid aortic valve   . Cancer ([place])   . Chest pain    a. 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  . Depression   . Expressive aphasia    a. ongoing since 04/2011 - seen by neurology - ? TIA vs. Migraine  . Facial numbness    a. ongoing since 04/2011 - seen by neurology - ? TIA vs. Migraine  . GERD (gastroesophageal reflux disease)   . Low blood pressure   . Presence of permanent cardiac pacemaker   . 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 Syncope    a. 04/2011 Echo: EF  55-65%, No RWMA, Gr 1 DD.     Past Surgical History:  Procedure Laterality Date  . ABDOMINAL WALL MESH  REMOVAL    . bladder tack    . cataract Bilateral 2004  . CHOLECYSTECTOMY  2000  . DILATION AND CURETTAGE OF UTERUS    . DIRECT LARYNGOSCOPY Right 06/16/2017   Procedure: MICRO DIRECT LARYNGOSCOPY WITH BIOPSY OF RIGHT BASE OF TONGUE;  Surgeon: Carloyn Manner, MD;  Location: [facility];  Service: ENT;  Laterality: Right;  . EP IMPLANTABLE DEVICE N/A 06/04/2015   Procedure: PPM Generator Changeout;  Surgeon: Deboraha Sprang, MD;  Location: [place] [facility];  Service: Cardiovascular;  Laterality: N/A;  . ESOPHAGOGASTRODUODENOSCOPY (EGD) WITH PROPOFOL N/A 12/18/2015   Procedure: ESOPHAGOGASTRODUODENOSCOPY (EGD) WITH gastric biopsy and dilation;  Surgeon: Lucilla Lame, MD;  Location: [place];  Service: Endoscopy;  Laterality: N/A;  . EYE SURGERY    . INSERT / REPLACE / REMOVE PACEMAKER    .  IR FLUORO GUIDE PORT INSERTION RIGHT  06/22/2017  . PACEMAKER INSERTION  2006   Medtronic Adapta ADDR01  . TONSILLECTOMY    . TONSILLECTOMY    . VAGINAL HYSTERECTOMY  2013   mad/cope    Social History   Socioeconomic History  . Marital status: Married    Spouse name: Not on file  . Number of children: Not on file  . Years of education: Not on file  . Highest education level: Not on file  Occupational History  . Not on file  Social Needs  . Financial resource strain: Not on file  . Food insecurity:    Worry: Not on file    Inability: Not on file  . Transportation needs:    Medical: Not on file    Non-medical: Not on file  Tobacco Use  . Smoking status: Never Smoker  . Smokeless tobacco: Never Used  . Tobacco comment: tobacco use -no  Substance and Sexual Activity  . Alcohol use: No  . Drug use: No  . Sexual activity: Yes    Birth control/protection: Surgical  Lifestyle  . Physical activity:    Days per week: Not on file    Minutes per session: Not on file  . Stress:  Not on file  Relationships  . Social connections:    Talks on phone: Not on file    Gets together: Not on file    Attends religious service: Not on file    Active member of club or organization: Not on file    Attends meetings of clubs or organizations: Not on file    Relationship status: Not on file  . Intimate partner violence:    Fear of current or ex partner: Not on file    Emotionally abused: Not on file    Physically abused: Not on file    Forced sexual activity: Not on file  Other Topics Concern  . Not on file  Social History Narrative   Lives in [place] with husband.  Works out regularly.  Works as a Geophysicist/field seismologist - owns own business.     Family History  Problem Relation Age of Onset  . COPD Mother        alive @ 48  . Atrial fibrillation Mother   . Stroke Father        died @ 23  . Breast cancer Maternal Aunt 70  . COPD Maternal Grandmother   . Ovarian cancer Neg Hx   . Diabetes Neg Hx      Current Outpatient Medications:  .  aspirin 81 MG tablet, Take 81 mg by mouth daily., Disp: , Rfl:  .  b complex vitamins tablet, Take 1 tablet by mouth daily., Disp: , Rfl:  .  BLACK ELDERBERRY,BERRY-FLOWER, PO, Take 1 capsule by mouth daily., Disp: , Rfl:  .  CRANBERRY SOFT PO, Take 2 each by mouth daily. , Disp: , Rfl:  .  lidocaine-prilocaine (EMLA) cream, Apply to affected area once, Disp: 30 g, Rfl: 3 .  NON FORMULARY, Take by mouth daily. Bone Up (calcium) , Disp: , Rfl:  .  Polyethyl Glycol-Propyl Glycol (SYSTANE) 0.4-0.3 % GEL ophthalmic gel, Place 1 application into both eyes daily as needed., Disp: , Rfl:  .  PRESCRIPTION MEDICATION, Place 1 tablet vaginally 3 (three) times a week. E3 1mg  and Testosterone 1mg  compound, Disp: , Rfl:  .  Rivaroxaban (XARELTO) 15 MG TABS tablet, Take 1 tablet (15 mg total) by mouth 2 (two) times daily  with a meal., Disp: 42 tablet, Rfl: 0 .  sertraline (ZOLOFT) 25 MG tablet, Take 1 tablet (25 mg total) by mouth daily., Disp:  30 tablet, Rfl: 0 .  Digestive Enzymes (DIGESTIVE ENZYME PO), Take 1-2 capsules by mouth daily., Disp: , Rfl:  .  fexofenadine (ALLEGRA) 60 MG tablet, Take 60 mg by mouth daily as needed. , Disp: , Rfl:  .  fluconazole (DIFLUCAN) 100 MG tablet, Take 1 tablet (100 mg total) by mouth daily. (Patient not taking: Reported on 09/30/2017), Disp: 5 tablet, Rfl: 0 .  fluticasone (FLONASE SENSIMIST) 27.5 MCG/SPRAY nasal spray, Place 1 spray into the nose daily as needed. , Disp: , Rfl:  .  HYDROcodone-acetaminophen (HYCET) 7.5-325 mg/15 ml solution, Take 10 mLs by mouth 4 (four) times daily as needed for moderate pain. (Patient not taking: Reported on 09/22/2017), Disp: 300 mL, Rfl: 0 .  loratadine (CLARITIN) 10 MG tablet, Take 10 mg by mouth daily as needed. , Disp: , Rfl:  .  LORazepam (ATIVAN) 0.5 MG tablet, TAKE 1 TABLET BY MOUTH EVERY 6 HOURS AS NEEDED FOR NAUSEA AND VOMITING, Disp: 30 tablet, Rfl: 0 .  magic mouthwash SOLN, Take 5 mLs by mouth 4 (four) times daily as needed for mouth pain. Swish and swallow (Patient not taking: Reported on 09/30/2017), Disp: 420 mL, Rfl: 1 .  nitroGLYCERIN (NITROSTAT) 0.4 MG SL tablet, Place 1 tablet (0.4 mg total) under the tongue every 5 (five) minutes as needed for chest pain. (Patient not taking: Reported on 09/30/2017), Disp: 25 tablet, Rfl: 3 .  ondansetron (ZOFRAN) 8 MG tablet, Take 1 tablet (8 mg total) by mouth 2 (two) times daily as needed. Start on the third day after chemotherapy. (Patient not taking: Reported on 09/22/2017), Disp: 30 tablet, Rfl: 1 .  prochlorperazine (COMPAZINE) 10 MG tablet, Take 1 tablet (10 mg total) by mouth every 6 (six) hours as needed (Nausea or vomiting). (Patient not taking: Reported on 09/22/2017), Disp: 30 tablet, Rfl: 1 .  sucralfate (CARAFATE) 1 g tablet, Take 1 tablet (1 g total) by mouth 3 (three) times daily. Dissolve in 2-3 tbsp warm water, swish and swallow. (Patient not taking: Reported on 09/15/2017), Disp: 90 tablet, Rfl: 3 No  current facility-administered medications for this visit.   Facility-Administered Medications Ordered in Other Visits:  .  0.9 %  sodium chloride infusion, , Intravenous, Once, Sindy Guadeloupe, MD  Physical exam:  Vitals:   09/30/17 0907  BP: 105/71  Pulse: 83  Resp: 18  Temp: (!) 97.3 F (36.3 C)  TempSrc: Tympanic  SpO2: 99%  Weight: 156 lb 8 oz (71 kg)   Physical Exam  Constitutional: She is oriented to person, place, and time.  Appears fatigued. In no acute distress. Persistent R foot shaking movements  HENT:  Head: Normocephalic and atraumatic.  Eyes: Pupils are equal, round, and reactive to light. EOM are normal.  Neck: Normal range of motion.  Cardiovascular: Normal rate, regular rhythm and normal heart sounds.  Pulmonary/Chest: Effort normal and breath sounds normal.  Abdominal: Soft. Bowel sounds are normal.  Musculoskeletal:  LLE swelling   Neurological: She is alert and oriented to person, place, and time.  Skin: Skin is warm and dry.     CMP Latest Ref Rng & Units 09/13/2017  Glucose 65 - 99 mg/dL 93  BUN 6 - 20 mg/dL 13  Creatinine 0.44 - 1.00 mg/dL 0.70  Sodium 135 - 145 mmol/L 141  Potassium 3.5 - 5.1 mmol/L 3.9  Chloride  101 - 111 mmol/L 109  CO2 22 - 32 mmol/L 25  Calcium 8.9 - 10.3 mg/dL 8.8(L)  Total Protein 6.5 - 8.1 g/dL 6.0(L)  Total Bilirubin 0.3 - 1.2 mg/dL 0.3  Alkaline Phos 38 - 126 U/L 82  AST 15 - 41 U/L 17  ALT 14 - 54 U/L 16   CBC Latest Ref Rng & Units 09/22/2017  WBC 3.6 - 11.0 K/uL 5.9  Hemoglobin 12.0 - 16.0 g/dL 9.5(L)  Hematocrit 35.0 - 47.0 % 27.9(L)  Platelets 150 - 440 K/uL 161    No images are attached to the encounter.  US Venous Img Lower Unilateral Left  Result Date: 09/26/2017 CLINICAL DATA:  Left leg swelling for 5 days. History of oropharyngeal cancer. EXAM: LEFT LOWER EXTREMITY VENOUS DOPPLER ULTRASOUND TECHNIQUE: Gray-scale sonography with graded compression, as well as color Doppler and duplex ultrasound were  performed to evaluate the lower extremity deep venous systems from the level of the common femoral vein and including the common femoral, femoral, profunda femoral, popliteal and calf veins including the posterior tibial, peroneal and gastrocnemius veins when visible. The superficial great saphenous vein was also interrogated. Spectral Doppler was utilized to evaluate flow at rest and with distal augmentation maneuvers in the common femoral, femoral and popliteal veins. COMPARISON:  None. FINDINGS: Contralateral Common Femoral Vein: Respiratory phasicity is normal and symmetric with the symptomatic side. No evidence of thrombus. Normal compressibility. Common Femoral Vein: No evidence of thrombus. Normal compressibility and color Doppler flow. Saphenofemoral Junction: No evidence of thrombus. Normal compressibility and flow on color Doppler imaging. Profunda Femoral Vein: No evidence of thrombus. Normal compressibility and flow on color Doppler imaging. Femoral Vein: Positive for thrombus. Left femoral vein is not compressible. Echogenic thrombus throughout the left femoral vein. Duplicated femoral veins in the distal thigh are both thrombosed. Left femoral DVT appears to be occlusive. Popliteal Vein: Positive for thrombus. Popliteal DVT appears to be occlusive. Calf Veins: Thrombus in the left posterior tibial veins. Questionable thrombus in some of the peroneal veins. Other Findings: Thrombus extending into the left short saphenous vein. IMPRESSION: Positive for deep venous thrombosis in the left lower extremity. There is thrombus extending from the proximal left femoral vein to the left calf veins. Superficial venous thrombosis involving the left short saphenous vein. Electronically Signed   By: Markus Daft M.D.   On: 09/26/2017 15:22     Assessment and plan- Patient is a 62 y.o. 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 Acute LLE DVT- doppler shows DVT involving left proximal femoral vein to calf vein. I spoke to Dr. Rockey Situ over the phone on 09/27/17 and he is ok from his standpoint for patient to go on anticoagulation. He has been following her for aortic aneurysm 4.8 cm in size stable over last 8 years. I have also been in touch with Dr. Lucky Cowboy from vascular surgery. They will be seeing her soon to see if she would be a candidate for thrombolysis.   Given her extensive DVT- I would recommend 6 months of anticoagulation. This is likely provoked in the setting of chemo/RT (cisplatin can increase risk of DVT) and relative inactivity  She can continue normal day to day walking but hold off on leg exercises until seen by vascular surgery   2. SCC oropharynx- repeat PET planned for 10/19/17 and I will see her a couple of days later to discuss those  results. Overall she has responded well and there is no palpable mass/ adenopathy on todays exam  3. Anxiety: continue prn ativan and zoloft     Visit Diagnosis 1. Squamous cell carcinoma of oropharynx ([place])   2. Anxiety disorder, unspecified type   3. DVT of deep femoral vein, left ([facility])      Dr. Randa Evens, MD, MPH [facility] Pager- [phone] 10/03/2017 8:41 AM

## 2017-10-05 ENCOUNTER — Ambulatory Visit: Payer: Self-pay | Admitting: Radiation Oncology

## 2017-10-06 ENCOUNTER — Other Ambulatory Visit: Payer: Self-pay | Admitting: *Deleted

## 2017-10-06 MED ORDER — SERTRALINE HCL 25 MG PO TABS: 25.0000 mg | ORAL_TABLET | Freq: Every day | ORAL | 0 refills | Status: DC

## 2017-10-10 ENCOUNTER — Ambulatory Visit (INDEPENDENT_AMBULATORY_CARE_PROVIDER_SITE_OTHER): Payer: BLUE CROSS/BLUE SHIELD | Admitting: Vascular Surgery

## 2017-10-10 ENCOUNTER — Encounter: Payer: Self-pay | Admitting: Radiation Oncology

## 2017-10-10 ENCOUNTER — Ambulatory Visit
Admission: RE | Admit: 2017-10-10 | Discharge: 2017-10-10 | Disposition: A | Discharge status: Home / Self Care | Payer: BLUE CROSS/BLUE SHIELD | Source: Ambulatory Visit | Attending: Radiation Oncology | Admitting: Radiation Oncology

## 2017-10-10 ENCOUNTER — Encounter (INDEPENDENT_AMBULATORY_CARE_PROVIDER_SITE_OTHER): Payer: Self-pay | Admitting: Vascular Surgery

## 2017-10-10 ENCOUNTER — Ambulatory Visit (INDEPENDENT_AMBULATORY_CARE_PROVIDER_SITE_OTHER): Payer: BLUE CROSS/BLUE SHIELD

## 2017-10-10 ENCOUNTER — Other Ambulatory Visit: Payer: Self-pay

## 2017-10-10 VITALS — BP 102/62 | HR 74 | Resp 13 | Ht 64.5 in | Wt 156.0 lb

## 2017-10-10 VITALS — BP 102/64 | Temp 96.0°F | Resp 18 | Wt 153.7 lb

## 2017-10-10 DIAGNOSIS — Z9221 Personal history of antineoplastic chemotherapy: Secondary | ICD-10-CM | POA: Insufficient documentation

## 2017-10-10 DIAGNOSIS — Z923 Personal history of irradiation: Secondary | ICD-10-CM | POA: Diagnosis not present

## 2017-10-10 DIAGNOSIS — I82412 Acute embolism and thrombosis of left femoral vein: Secondary | ICD-10-CM

## 2017-10-10 DIAGNOSIS — C01 Malignant neoplasm of base of tongue: Secondary | ICD-10-CM | POA: Insufficient documentation

## 2017-10-10 DIAGNOSIS — Y842 Radiological procedure and radiotherapy as the cause of abnormal reaction of the patient, or of later complication, without mention of misadventure at the time of the procedure: Secondary | ICD-10-CM | POA: Insufficient documentation

## 2017-10-10 DIAGNOSIS — I82492 Acute embolism and thrombosis of other specified deep vein of left lower extremity: Secondary | ICD-10-CM | POA: Diagnosis not present

## 2017-10-10 DIAGNOSIS — C760 Malignant neoplasm of head, face and neck: Secondary | ICD-10-CM

## 2017-10-10 NOTE — Progress Notes (Signed)
Subjective:    Patient ID: Unknown Wendy Kelly, female    DOB: 01/07/1956, 62 y.o.   MRN: [id number] Chief Complaint  Patient presents with  . Follow-up    2 week DVT LLE f/u   Patient presents for a one-week DVT follow-up.  The patient was diagnosed with a left lower extremity DVT on September 26, 2017.  The patient was placed on Xarelto which she is taking every day.  The patient underwent a left lower extremity DVT duplex exam which was notable for no evidence of common femoral vein obstruction to the right lower extremity.  Deep vein thrombosis noted from the distal left common femoral vein, femoral, popliteal, peroneal, posterior tibial and gastrocnemius veins.  This is stable when compared to her September 26, 2017 ultrasound.  The patient is still concerned about when she can return to work, level of activity and length of anticoagulation.  The patient has not started to wear compression socks yet.  No fever, nausea vomiting.  No shortness of breath or chest pain.  Review of Systems  Constitutional: Negative.   HENT: Negative.   Eyes: Negative.   Respiratory: Negative.   Cardiovascular: Positive for leg swelling.       DVT  Gastrointestinal: Negative.   Endocrine: Negative.   Genitourinary: Negative.   Musculoskeletal: Negative.   Skin: Negative.   Allergic/Immunologic: Negative.   Neurological: Negative.   Hematological: Negative.   Psychiatric/Behavioral: Negative.       Objective:   Physical Exam  Please note physical exam has not changed when compared to the previous exam on October 03, 2017:  Constitutional: She appears well-developed and well-nourished. No distress.  HENT:  Head: Normocephalic and atraumatic.  Eyes: Pupils are equal, round, and reactive to light. Conjunctivae are normal.  Neck: Normal range of motion.  Cardiovascular: Normal rate, regular rhythm, normal heart sounds and intact distal pulses.  Pulses:      Radial pulses are 2+ on the right side, and 2+ on the  left side.       Dorsalis pedis pulses are 2+ on the right side, and 2+ on the left side.       Posterior tibial pulses are 2+ on the right side, and 2+ on the left side.  Left lower extremity: Calf soft, thigh soft,.  Calf nontender to palpation.  No pain with dorsiflexion.  Skin is intact.  There is no cellulitis.  Pulmonary/Chest: Effort normal and breath sounds normal.  Musculoskeletal: Normal range of motion. She exhibits edema (Left lower extremity mild to moderate nonpitting edema).  Skin: Skin is warm and dry. She is not diaphoretic.  Psychiatric: She has a normal mood and affect. Her behavior is normal. Judgment and thought content normal.  Vitals reviewed.  BP 102/62 (BP Location: Left Arm, Patient Position: Sitting)   Pulse 74   Resp 13   Ht 5' 4.5" (1.638 m)   Wt 156 lb (70.8 kg)   BMI 26.36 kg/m   Past Medical History:  Diagnosis Date  . AA (aortic aneurysm) ([place])    a. 04/2011 Echo: Ao Root: 4.1cm, Asc Ao 4.7cm.  . Anxiety   . AV block, 2nd degree    a. 05/2005 - s/p MDT Adapta ADDR01 Dual Chamber PPM  . Bicuspid aortic valve   . Cancer ([place])   . Chest pain    a. 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  . Depression   . Expressive aphasia    a. ongoing since 04/2011 - seen by neurology - ?  TIA vs. Migraine  . Facial numbness    a. ongoing since 04/2011 - seen by neurology - ? TIA vs. Migraine  . GERD (gastroesophageal reflux disease)   . Low blood pressure   . Presence of permanent cardiac pacemaker   . 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 Syncope    a. 04/2011 Echo: EF 55-65%, No RWMA, Gr 1 DD.   Social History   Socioeconomic History  . Marital status: Married    Spouse name: Not on file  . Number of children: Not on file  . Years of education: Not on file  . Highest education level: Not on file  Occupational History  . Not on file  Social Needs  . Financial resource strain: Not on file  . Food insecurity:    Worry: Not on file     Inability: Not on file  . Transportation needs:    Medical: Not on file    Non-medical: Not on file  Tobacco Use  . Smoking status: Never Smoker  . Smokeless tobacco: Never Used  . Tobacco comment: tobacco use -no  Substance and Sexual Activity  . Alcohol use: No  . Drug use: No  . Sexual activity: Yes    Birth control/protection: Surgical  Lifestyle  . Physical activity:    Days per week: Not on file    Minutes per session: Not on file  . Stress: Not on file  Relationships  . Social connections:    Talks on phone: Not on file    Gets together: Not on file    Attends religious service: Not on file    Active member of club or organization: Not on file    Attends meetings of clubs or organizations: Not on file    Relationship status: Not on file  . Intimate partner violence:    Fear of current or ex partner: Not on file    Emotionally abused: Not on file    Physically abused: Not on file    Forced sexual activity: Not on file  Other Topics Concern  . Not on file  Social History Narrative   Lives in [place] with husband.  Works out regularly.  Works as a Geophysicist/field seismologist - owns own business.    Past Surgical History:  Procedure Laterality Date  . ABDOMINAL WALL MESH  REMOVAL    . bladder tack    . cataract Bilateral 2004  . CHOLECYSTECTOMY  2000  . DILATION AND CURETTAGE OF UTERUS    . DIRECT LARYNGOSCOPY Right 06/16/2017   Procedure: MICRO DIRECT LARYNGOSCOPY WITH BIOPSY OF RIGHT BASE OF TONGUE;  Surgeon: Carloyn Manner, MD;  Location: [facility];  Service: ENT;  Laterality: Right;  . EP IMPLANTABLE DEVICE N/A 06/04/2015   Procedure: PPM Generator Changeout;  Surgeon: Deboraha Sprang, MD;  Location: [facility];  Service: Cardiovascular;  Laterality: N/A;  . ESOPHAGOGASTRODUODENOSCOPY (EGD) WITH PROPOFOL N/A 12/18/2015   Procedure: ESOPHAGOGASTRODUODENOSCOPY (EGD) WITH gastric biopsy and dilation;  Surgeon: Lucilla Lame, MD;  Location: [facility];   Service: Endoscopy;  Laterality: N/A;  . EYE SURGERY    . INSERT / REPLACE / REMOVE PACEMAKER    . IR FLUORO GUIDE PORT INSERTION RIGHT  06/22/2017  . PACEMAKER INSERTION  2006   [employer] Adapta ADDR01  . TONSILLECTOMY    . TONSILLECTOMY    . VAGINAL HYSTERECTOMY  2013   mad/cope   Family History  Problem  Relation Age of Onset  . COPD Mother        alive @ 52  . Atrial fibrillation Mother   . Stroke Father        died @ 45  . Breast cancer Maternal Aunt 70  . COPD Maternal Grandmother   . Ovarian cancer Neg Hx   . Diabetes Neg Hx    Allergies  Allergen Reactions  . Cefdinir Swelling    Swelling in mouth   . Cephalosporins Swelling  . Dairy Aid [Lactase] Swelling    Milk and milk products cause swelling and tingling of tongue  . Flagyl [Metronidazole] Swelling    Mouth and throat and ears go red and burn  . Lactose Intolerance (Gi) Other (See Comments)    GI upset.  Eats only gluten free  . Penicillins Rash    Rash/hives  . Phenylephrine-Guaifenesin Anaphylaxis    Night terrors  . Sulfa Antibiotics Rash and Hives    Other reaction(s): Unknown   . Sulfonamide Derivatives Rash    Rash/itching.  From head to her toes, the itching was horrible. Penicillin is the worst  . Tape Rash    Adhesives all cause a problem severely.  It doesn't matter if it is paper tape or not.  Marland Kitchen Dexilant [Dexlansoprazole]     Pressure in head like head is in a vice and being squeezed  . Entex Lq [Phenylephrine-Guaifenesin]     Night terrors  . Other Other (See Comments)    Flu vaccine:  Weakness, dizziness, tia type symptoms.    . Oxycodone Nausea And Vomiting    And itching  . Succinylcholine Other (See Comments)    Trouble waking up  . Tetracycline Other (See Comments)    Unkown  . Codeine Nausea And Vomiting  . Guaifenesin & Derivatives Other (See Comments)    Night terrors  . Lactobacillus Itching  . Naproxen Sodium     Itching/rash  . Pseudoephedrine Rash  . Tetracyclines &  Related Other (See Comments)    Was taking flagyl at the same time; unsure which caused the swelling  Swelling of throat. Pharmacist states that he believes it was the flagyl and not the tetracycline  . Wheat Bran Other (See Comments)    Upset stomach. Now eats gluten free      Assessment & Plan:  Patient presents for a one-week DVT follow-up.  The patient was diagnosed with a left lower extremity DVT on September 26, 2017.  The patient was placed on Xarelto which she is taking every day.  The patient underwent a left lower extremity DVT duplex exam which was notable for no evidence of common femoral vein obstruction to the right lower extremity.  Deep vein thrombosis noted from the distal left common femoral vein, femoral, popliteal, peroneal, posterior tibial and gastrocnemius veins.  This is stable when compared to her September 26, 2017 ultrasound.  The patient is still concerned about when she can return to work, level of activity and length of anticoagulation.  The patient has not started to wear compression socks yet.  No fever, nausea vomiting.  No shortness of breath or chest pain.  1. Deep vein thrombosis (DVT) of other vein of left lower extremity, unspecified chronicity (HCC) - Stable There is no propagation of the patient's left lower extremity DVT on today's duplex There is no worsening in the patient's physical exam.  Her exam is stable. I encouraged the patient to start wearing compression socks in another 2 weeks as  the acute phase would have passed by then. The patient is okay to return to work in 2 weeks I recommend the patient does not engage in any contact or strenuous activity Patient will be on oral anticoagulation for at least 6 months I will bring the patient back in 3 months and repeat a left lower extremity DVT study If the patient should experience any shortness of breath or chest pain she should seek medical attention in the emergency department immediately  - VAS [place] LOWER  EXTREMITY VENOUS (DVT); Future  Of note - the patient is asking for a referral to a podiatrist-currently she goes to a nail salon to have her nails cut would like somebody more professional to do it.  Current Outpatient Medications on File Prior to Visit  Medication Sig Dispense Refill  . aspirin 81 MG tablet Take 81 mg by mouth daily.    Marland Kitchen b complex vitamins tablet Take 1 tablet by mouth daily.    Marland Kitchen CRANBERRY SOFT PO Take 2 each by mouth daily.     . fexofenadine (ALLEGRA) 60 MG tablet Take 60 mg by mouth daily as needed.     . fluticasone (FLONASE SENSIMIST) 27.5 MCG/SPRAY nasal spray Place 1 spray into the nose daily as needed.     . lidocaine-prilocaine (EMLA) cream Apply to affected area once 30 g 3  . loratadine (CLARITIN) 10 MG tablet Take 10 mg by mouth daily as needed.     Marland Kitchen LORazepam (ATIVAN) 0.5 MG tablet TAKE 1 TABLET BY MOUTH EVERY 6 HOURS AS NEEDED FOR NAUSEA AND VOMITING 30 tablet 0  . PRESCRIPTION MEDICATION Place 1 tablet vaginally 3 (three) times a week. E3 1mg  and Testosterone 1mg  compound    . Rivaroxaban (XARELTO) 15 MG TABS tablet Take 1 tablet (15 mg total) by mouth 2 (two) times daily with a meal. 42 tablet 0  . sertraline (ZOLOFT) 25 MG tablet Take 1 tablet (25 mg total) by mouth daily. 30 tablet 0  . BLACK ELDERBERRY,BERRY-FLOWER, PO Take 1 capsule by mouth daily.    . Digestive Enzymes (DIGESTIVE ENZYME PO) Take 1-2 capsules by mouth daily.    . fluconazole (DIFLUCAN) 100 MG tablet Take 1 tablet (100 mg total) by mouth daily. (Patient not taking: Reported on 10/10/2017) 5 tablet 0  . HYDROcodone-acetaminophen (HYCET) 7.5-325 mg/15 ml solution Take 10 mLs by mouth 4 (four) times daily as needed for moderate pain. (Patient not taking: Reported on 09/22/2017) 300 mL 0  . magic mouthwash SOLN Take 5 mLs by mouth 4 (four) times daily as needed for mouth pain. Swish and swallow (Patient not taking: Reported on 09/30/2017) 420 mL 1  . nitroGLYCERIN (NITROSTAT) 0.4 MG SL tablet  Place 1 tablet (0.4 mg total) under the tongue every 5 (five) minutes as needed for chest pain. (Patient not taking: Reported on 09/30/2017) 25 tablet 3  . NON FORMULARY Take by mouth daily. Bone Up (calcium)     . ondansetron (ZOFRAN) 8 MG tablet Take 1 tablet (8 mg total) by mouth 2 (two) times daily as needed. Start on the third day after chemotherapy. (Patient not taking: Reported on 10/10/2017) 30 tablet 1  . Polyethyl Glycol-Propyl Glycol (SYSTANE) 0.4-0.3 % GEL ophthalmic gel Place 1 application into both eyes daily as needed.    . prochlorperazine (COMPAZINE) 10 MG tablet Take 1 tablet (10 mg total) by mouth every 6 (six) hours as needed (Nausea or vomiting). (Patient not taking: Reported on 10/10/2017) 30 tablet 1  .  sucralfate (CARAFATE) 1 g tablet Take 1 tablet (1 g total) by mouth 3 (three) times daily. Dissolve in 2-3 tbsp warm water, swish and swallow. (Patient not taking: Reported on 09/15/2017) 90 tablet 3   Current Facility-Administered Medications on File Prior to Visit  Medication Dose Route Frequency Provider Last Rate Last Dose  . 0.9 %  sodium chloride infusion   Intravenous Once Sindy Guadeloupe, MD       There are no Patient Instructions on file for this visit. No follow-ups on file.  Jaslynne Dahan A Sparkles Mcneely, PA-C

## 2017-10-10 NOTE — Progress Notes (Signed)
Radiation Oncology Follow up Note  Name: Wendy Kelly   Date:   10/10/2017 MRN:  [id number] DOB: Feb 08, 1956    This 62 y.o. 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 She seen today in routine follow-up and is doing fairly well. She recently completed Diflucan therapy for oral candidiasis. She states she is swallowing well no head and neck pain. She states her mouth is dry although her taste buds are returning. She's been to ENT who has stated he is happy with her progress..  COMPLICATIONS OF TREATMENT: none  FOLLOW UP COMPLIANCE: keeps appointments   PHYSICAL EXAM:  BP 102/64   Temp (!) 96 F (35.6 C)   Resp 18   Wt 153 lb 10.6 oz (69.7 kg)   BMI 26.38 kg/m  Oral cavity is clear no oral mucosal lesions are identified indirect mirror examination shows upper airway clear vallecula and base of tongue within normal limits. Neck is clear without evidence of cervical subject I gastric or supraclavicular adenopathy. Well-developed well-nourished patient in NAD. HEENT reveals PERLA, EOMI, discs not visualized.  Oral cavity is clear. No oral mucosal lesions are identified. Neck is clear without evidence of cervical or supraclavicular adenopathy. Lungs are clear to A&P. Cardiac examination is essentially unremarkable with regular rate and rhythm without murmur rub or thrill. Abdomen is benign with no organomegaly or masses noted. Motor sensory and DTR levels are equal and symmetric in the upper and lower extremities. Cranial nerves II through XII are grossly intact. Proprioception is intact. No peripheral adenopathy or edema is identified.  No motor or sensory levels are noted. Crude visual fields are within normal range.  RADIOLOGY RESULTS: no current films for review patient scheduled for PET CT scan this month which I will review  PLAN: present time patient is doing well slowly recovering from her concurrent chemoradiation therapy. I'm please were overall progress. She continues to drink plenty of fluids and's use mouth candies. She is otherwise doing well. I've asked to see her back in 3-4 months for follow-up. She continues close follow-up care with ENT. Patient knows to call with any concerns.  I would like to take this opportunity to thank you for allowing me to participate in the care of your patient.Noreene Filbert, MD

## 2017-10-13 ENCOUNTER — Other Ambulatory Visit: Payer: Self-pay | Admitting: *Deleted

## 2017-10-13 ENCOUNTER — Inpatient Hospital Stay: Payer: BLUE CROSS/BLUE SHIELD | Attending: Oncology | Admitting: Oncology

## 2017-10-13 ENCOUNTER — Telehealth: Payer: Self-pay | Admitting: *Deleted

## 2017-10-13 ENCOUNTER — Encounter: Payer: Self-pay | Admitting: Oncology

## 2017-10-13 VITALS — BP 100/68 | HR 88 | Temp 98.0°F | Resp 18 | Ht 64.5 in | Wt 165.0 lb

## 2017-10-13 DIAGNOSIS — R079 Chest pain, unspecified: Secondary | ICD-10-CM | POA: Diagnosis not present

## 2017-10-13 DIAGNOSIS — F419 Anxiety disorder, unspecified: Secondary | ICD-10-CM | POA: Insufficient documentation

## 2017-10-13 DIAGNOSIS — C01 Malignant neoplasm of base of tongue: Secondary | ICD-10-CM | POA: Diagnosis not present

## 2017-10-13 DIAGNOSIS — R531 Weakness: Secondary | ICD-10-CM

## 2017-10-13 DIAGNOSIS — R4701 Aphasia: Secondary | ICD-10-CM | POA: Insufficient documentation

## 2017-10-13 DIAGNOSIS — R5383 Other fatigue: Secondary | ICD-10-CM | POA: Diagnosis not present

## 2017-10-13 DIAGNOSIS — Q231 Congenital insufficiency of aortic valve: Secondary | ICD-10-CM | POA: Insufficient documentation

## 2017-10-13 DIAGNOSIS — Z923 Personal history of irradiation: Secondary | ICD-10-CM | POA: Diagnosis not present

## 2017-10-13 DIAGNOSIS — H9201 Otalgia, right ear: Secondary | ICD-10-CM | POA: Diagnosis not present

## 2017-10-13 DIAGNOSIS — I82401 Acute embolism and thrombosis of unspecified deep veins of right lower extremity: Secondary | ICD-10-CM | POA: Insufficient documentation

## 2017-10-13 DIAGNOSIS — F418 Other specified anxiety disorders: Secondary | ICD-10-CM | POA: Insufficient documentation

## 2017-10-13 DIAGNOSIS — K219 Gastro-esophageal reflux disease without esophagitis: Secondary | ICD-10-CM | POA: Diagnosis not present

## 2017-10-13 DIAGNOSIS — Z7901 Long term (current) use of anticoagulants: Secondary | ICD-10-CM | POA: Diagnosis not present

## 2017-10-13 DIAGNOSIS — Z79899 Other long term (current) drug therapy: Secondary | ICD-10-CM | POA: Diagnosis not present

## 2017-10-13 DIAGNOSIS — I714 Abdominal aortic aneurysm, without rupture: Secondary | ICD-10-CM | POA: Diagnosis not present

## 2017-10-13 DIAGNOSIS — Z9221 Personal history of antineoplastic chemotherapy: Secondary | ICD-10-CM | POA: Diagnosis not present

## 2017-10-13 MED ORDER — RIVAROXABAN 20 MG PO TABS: 20.0000 mg | ORAL_TABLET | Freq: Every day | ORAL | 2 refills | Status: DC

## 2017-10-13 NOTE — Telephone Encounter (Signed)
I called patient again and she has accepted appointment for 130 today

## 2017-10-13 NOTE — Progress Notes (Signed)
Patient c/o right ear pain ( 5-6 ) this AM but still have some achy noted to her ear. Patient states she has not taking anything for pain.

## 2017-10-13 NOTE — Telephone Encounter (Signed)
Call placed to patient had to leave message on voice mail regarding appointment at 130. I asked that she call back to confirm acceptance

## 2017-10-13 NOTE — Telephone Encounter (Signed)
Patient called stating she is about to run out of her Xarelto 15 mg and that she should be getting a dose change as she is completing 21 days of treatment. She is also asking if she can be seen for ear pain that she has had off and on and states she isn't sure if it is allergies or what, but would like to be seen. Please advise

## 2017-10-13 NOTE — Telephone Encounter (Signed)
You can add her to my schedule at 1.30 today

## 2017-10-14 NOTE — Progress Notes (Signed)
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 Consuela Mimes evaluation of right neck mass. The mass has been present for or a year and has been growing gradually. It was associated with some right ear pain but no fever. Patient has been a never smoker.Ultrasound on 05/30/2017 showed:Single enlarged lymph node in the right neck measures approximately 4.8 x 1.7 x 3.0 cm  2.Patient underwent IR guided lymph node biopsy of the cervical lymph node which showed metastatic squamous cell carcinoma. Staining for P 16 was positive and sox 10 was negative   3.PET/CT scan on 06/09/2017 showed intensely hypermetabolic enlarged right level 2 cervical lymph node with an SUV of 11.1. No clear site of primary malignancy within the head and neck. Potential site of investigation would include right base of tongue. There was asymmetric metabolic activity noted in that area with an SUV of 4.5. Less suspicious region in the posterior glottis. No evidence of distant metastatic disease.  4.Patient works as a Geophysicist/field seismologist at [employer]. Patient has had multiple allergies to medications in the past. Reports that when she took steroids in the past it made her quite aggravated. Her sister was hospitalized in a mental health facility after receiving a steroid course in the past.  5. Right tongue base biopsy positive for SCC.Baseline audiogram normal. Concurrent  chemo/RT completed on 09/05/17.  6. RLE DVT on 09/27/17. Currently on xarelto  Interval history- reports right ear pain that started this morning. No discharge or fever.  Anxiety symptoms are improving. She is still taking zoloft but using ativan 2 times a day. She is back to working part time She mainly performs upper body exercises. Walks regularly. Leg swelling persists. Taking xarelto without any issues.  Appetite is better. Mouth still feels somewhat dry   ECOG PS- 1 Pain scale- 0   Review of systems- Review of Systems  Constitutional: Positive for malaise/fatigue. Negative for chills, fever and weight loss.  HENT: Positive for ear pain. Negative for congestion, ear discharge and nosebleeds.   Eyes: Negative for blurred vision.  Respiratory: Negative for cough, hemoptysis, sputum production, shortness of breath and wheezing.   Cardiovascular: Negative for chest pain, palpitations, orthopnea and claudication.  Gastrointestinal: Negative for abdominal pain, blood in stool, constipation, diarrhea, heartburn, melena, nausea and vomiting.  Genitourinary: Negative for dysuria, flank pain, frequency, hematuria and urgency.  Musculoskeletal: Negative for back pain, joint pain and myalgias.  Skin: Negative for rash.  Neurological: Negative for dizziness, tingling, focal weakness, seizures, weakness and headaches.  Endo/Heme/Allergies: Does not bruise/bleed easily.  Psychiatric/Behavioral: Negative for depression and suicidal ideas. The patient is nervous/anxious. The patient does not have insomnia.       Allergies  Allergen Reactions  . Cefdinir Swelling    Swelling in mouth   . Cephalosporins Swelling  . Dairy Aid [Lactase] Swelling    Milk and milk products cause swelling and tingling of tongue  . Flagyl [Metronidazole] Swelling    Mouth and throat and ears go  red and burn  . Lactose Intolerance (Gi) Other (See Comments)    GI upset.  Eats only gluten free  . Penicillins Rash      Rash/hives  . Phenylephrine-Guaifenesin Anaphylaxis    Night terrors  . Sulfa Antibiotics Rash and Hives    Other reaction(s): Unknown   . Sulfonamide Derivatives Rash    Rash/itching.  From head to her toes, the itching was horrible. Penicillin is the worst  . Tape Rash    Adhesives all cause a problem severely.  It doesn't matter if it is paper tape or not.  Marland Kitchen Dexilant [Dexlansoprazole]     Pressure in head like head is in a vice and being squeezed  . Entex Lq [Phenylephrine-Guaifenesin]     Night terrors  . Other Other (See Comments)    Flu vaccine:  Weakness, dizziness, tia type symptoms.    . Oxycodone Nausea And Vomiting    And itching  . Succinylcholine Other (See Comments)    Trouble waking up  . Tetracycline Other (See Comments)    Unkown  . Codeine Nausea And Vomiting  . Guaifenesin & Derivatives Other (See Comments)    Night terrors  . Lactobacillus Itching  . Naproxen Sodium     Itching/rash  . Pseudoephedrine Rash  . Tetracyclines & Related Other (See Comments)    Was taking flagyl at the same time; unsure which caused the swelling  Swelling of throat. Pharmacist states that he believes it was the flagyl and not the tetracycline  . Wheat Bran Other (See Comments)    Upset stomach. Now eats gluten free     Past Medical History:  Diagnosis Date  . AA (aortic aneurysm) ([place])    a. 04/2011 Echo: Ao Root: 4.1cm, Asc Ao 4.7cm.  . Anxiety   . AV block, 2nd degree    a. 05/2005 - s/p MDT Adapta ADDR01 Dual Chamber PPM  . Bicuspid aortic valve   . Cancer ([place])   . Chest pain    a. 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  . Depression   . Expressive aphasia    a. ongoing since 04/2011 - seen by neurology - ? TIA vs. Migraine  . Facial numbness    a. ongoing since 04/2011 - seen by neurology - ? TIA vs. Migraine  . GERD (gastroesophageal reflux disease)   . Low blood pressure   . Presence of permanent cardiac pacemaker   . Squamous cell carcinoma 05/2017   lymph node  right side of neck  . Syncope    a. 04/2011 Echo: EF 55-65%, No RWMA, Gr 1 DD.     Past Surgical History:  Procedure Laterality Date  . ABDOMINAL WALL MESH  REMOVAL    . bladder tack    . cataract Bilateral 2004  . CHOLECYSTECTOMY  2000  . DILATION AND CURETTAGE OF UTERUS    . DIRECT LARYNGOSCOPY Right 06/16/2017   Procedure: MICRO DIRECT LARYNGOSCOPY WITH BIOPSY OF RIGHT BASE OF TONGUE;  Surgeon: Carloyn Manner, MD;  Location: [facility];  Service: ENT;  Laterality: Right;  . EP IMPLANTABLE DEVICE N/A 06/04/2015   Procedure: PPM Generator Changeout;  Surgeon: Deboraha Sprang, MD;  Location: [place] [facility];  Service: Cardiovascular;  Laterality: N/A;  . ESOPHAGOGASTRODUODENOSCOPY (EGD) WITH PROPOFOL N/A 12/18/2015   Procedure: ESOPHAGOGASTRODUODENOSCOPY (EGD) WITH gastric biopsy and dilation;  Surgeon: Lucilla Lame, MD;  Location: [place];  Service: Endoscopy;  Laterality: N/A;  . EYE SURGERY    . INSERT / REPLACE /  REMOVE PACEMAKER    . IR FLUORO GUIDE PORT INSERTION RIGHT  06/22/2017  . PACEMAKER INSERTION  2006   [employer] Adapta ADDR01  . TONSILLECTOMY    . TONSILLECTOMY    . VAGINAL HYSTERECTOMY  2013   mad/cope    Social History   Socioeconomic History  . Marital status: Married    Spouse name: Not on file  . Number of children: Not on file  . Years of education: Not on file  . Highest education level: Not on file  Occupational History  . Not on file  Social Needs  . Financial resource strain: Not on file  . Food insecurity:    Worry: Not on file    Inability: Not on file  . Transportation needs:    Medical: Not on file    Non-medical: Not on file  Tobacco Use  . Smoking status: Never Smoker  . Smokeless tobacco: Never Used  . Tobacco comment: tobacco use -no  Substance and Sexual Activity  . Alcohol use: No  . Drug use: No  . Sexual activity: Yes    Birth control/protection: Surgical  Lifestyle  . Physical activity:    Days per week: Not  on file    Minutes per session: Not on file  . Stress: Not on file  Relationships  . Social connections:    Talks on phone: Not on file    Gets together: Not on file    Attends religious service: Not on file    Active member of club or organization: Not on file    Attends meetings of clubs or organizations: Not on file    Relationship status: Not on file  . Intimate partner violence:    Fear of current or ex partner: Not on file    Emotionally abused: Not on file    Physically abused: Not on file    Forced sexual activity: Not on file  Other Topics Concern  . Not on file  Social History Narrative   Lives in Home with husband.  Works out regularly.  Works as a Geophysicist/field seismologist - owns own business.     Family History  Problem Relation Age of Onset  . COPD Mother        alive @ 46  . Atrial fibrillation Mother   . Stroke Father        died @ 35  . Breast cancer Maternal Aunt 70  . COPD Maternal Grandmother   . Ovarian cancer Neg Hx   . Diabetes Neg Hx      Current Outpatient Medications:  .  aspirin 81 MG tablet, Take 81 mg by mouth daily., Disp: , Rfl:  .  b complex vitamins tablet, Take 1 tablet by mouth daily., Disp: , Rfl:  .  BLACK ELDERBERRY,BERRY-FLOWER, PO, Take 1 capsule by mouth daily., Disp: , Rfl:  .  CRANBERRY SOFT PO, Take 2 each by mouth daily. , Disp: , Rfl:  .  Digestive Enzymes (DIGESTIVE ENZYME PO), Take 1-2 capsules by mouth daily., Disp: , Rfl:  .  fexofenadine (ALLEGRA) 60 MG tablet, Take 60 mg by mouth daily as needed. , Disp: , Rfl:  .  fluconazole (DIFLUCAN) 100 MG tablet, Take 1 tablet (100 mg total) by mouth daily., Disp: 5 tablet, Rfl: 0 .  fluticasone (FLONASE SENSIMIST) 27.5 MCG/SPRAY nasal spray, Place 1 spray into the nose daily as needed. , Disp: , Rfl:  .  HYDROcodone-acetaminophen (HYCET) 7.5-325 mg/15 ml solution, Take 10 mLs  by mouth 4 (four) times daily as needed for moderate pain., Disp: 300 mL, Rfl: 0 .   lidocaine-prilocaine (EMLA) cream, Apply to affected area once, Disp: 30 g, Rfl: 3 .  loratadine (CLARITIN) 10 MG tablet, Take 10 mg by mouth daily as needed. , Disp: , Rfl:  .  LORazepam (ATIVAN) 0.5 MG tablet, TAKE 1 TABLET BY MOUTH EVERY 6 HOURS AS NEEDED FOR NAUSEA AND VOMITING, Disp: 30 tablet, Rfl: 0 .  magic mouthwash SOLN, Take 5 mLs by mouth 4 (four) times daily as needed for mouth pain. Swish and swallow, Disp: 420 mL, Rfl: 1 .  nitroGLYCERIN (NITROSTAT) 0.4 MG SL tablet, Place 1 tablet (0.4 mg total) under the tongue every 5 (five) minutes as needed for chest pain., Disp: 25 tablet, Rfl: 3 .  NON FORMULARY, Take by mouth daily. Bone Up (calcium) , Disp: , Rfl:  .  ondansetron (ZOFRAN) 8 MG tablet, Take 1 tablet (8 mg total) by mouth 2 (two) times daily as needed. Start on the third day after chemotherapy., Disp: 30 tablet, Rfl: 1 .  Polyethyl Glycol-Propyl Glycol (SYSTANE) 0.4-0.3 % GEL ophthalmic gel, Place 1 application into both eyes daily as needed., Disp: , Rfl:  .  polyethylene glycol (MIRALAX / GLYCOLAX) packet, Take 17 g by mouth daily., Disp: , Rfl:  .  PRESCRIPTION MEDICATION, Place 1 tablet vaginally 3 (three) times a week. E3 1mg  and Testosterone 1mg  compound, Disp: , Rfl:  .  prochlorperazine (COMPAZINE) 10 MG tablet, Take 1 tablet (10 mg total) by mouth every 6 (six) hours as needed (Nausea or vomiting)., Disp: 30 tablet, Rfl: 1 .  Rivaroxaban (XARELTO) 15 MG TABS tablet, Take 15 mg by mouth 2 (two) times daily with a meal., Disp: , Rfl:  .  rivaroxaban (XARELTO) 20 MG TABS tablet, Take 1 tablet (20 mg total) by mouth daily with supper., Disp: 30 tablet, Rfl: 2 .  sertraline (ZOLOFT) 25 MG tablet, Take 1 tablet (25 mg total) by mouth daily., Disp: 30 tablet, Rfl: 0 .  sucralfate (CARAFATE) 1 g tablet, Take 1 tablet (1 g total) by mouth 3 (three) times daily. Dissolve in 2-3 tbsp warm water, swish and swallow., Disp: 90 tablet, Rfl: 3 No current facility-administered  medications for this visit.   Facility-Administered Medications Ordered in Other Visits:  .  0.9 %  sodium chloride infusion, , Intravenous, Once, Sindy Guadeloupe, MD  Physical exam:  Vitals:   10/13/17 1339  BP: 100/68  Pulse: 88  Resp: 18  Temp: 98 F (36.7 C)  TempSrc: Tympanic  SpO2: 99%  Weight: 165 lb (74.8 kg)  Height: 5' 4.5" (1.638 m)   Physical Exam  Constitutional: She is oriented to person, place, and time and well-developed, well-nourished, and in no distress.  HENT:  Head: Normocephalic and atraumatic.  Right Ear: External ear normal.  Left Ear: External ear normal.  Mouth/Throat: Oropharynx is clear and moist.  No redness and swelling in the right mastoid region  Eyes: Pupils are equal, round, and reactive to light. EOM are normal.  Neck: Normal range of motion.  Cardiovascular: Normal rate, regular rhythm and normal heart sounds.  Pulmonary/Chest: Effort normal and breath sounds normal.  Abdominal: Soft. Bowel sounds are normal.  Musculoskeletal:  Left lower extremity diffusely swollen as compared to right  Neurological: She is alert and oriented to person, place, and time.  Skin: Skin is warm and dry.     CMP Latest Ref Rng & Units 09/13/2017  Glucose  65 - 99 mg/dL 93  BUN 6 - 20 mg/dL 13  Creatinine 0.44 - 1.00 mg/dL 0.70  Sodium 135 - 145 mmol/L 141  Potassium 3.5 - 5.1 mmol/L 3.9  Chloride 101 - 111 mmol/L 109  CO2 22 - 32 mmol/L 25  Calcium 8.9 - 10.3 mg/dL 8.8(L)  Total Protein 6.5 - 8.1 g/dL 6.0(L)  Total Bilirubin 0.3 - 1.2 mg/dL 0.3  Alkaline Phos 38 - 126 U/L 82  AST 15 - 41 U/L 17  ALT 14 - 54 U/L 16   CBC Latest Ref Rng & Units 09/22/2017  WBC 3.6 - 11.0 K/uL 5.9  Hemoglobin 12.0 - 16.0 g/dL 9.5(L)  Hematocrit 35.0 - 47.0 % 27.9(L)  Platelets 150 - 440 K/uL 161    No images are attached to the encounter.  US Venous Img Lower Unilateral Left  Result Date: 09/26/2017 CLINICAL DATA:  Left leg swelling for 5 days. History of  oropharyngeal cancer. EXAM: LEFT LOWER EXTREMITY VENOUS DOPPLER ULTRASOUND TECHNIQUE: Gray-scale sonography with graded compression, as well as color Doppler and duplex ultrasound were performed to evaluate the lower extremity deep venous systems from the level of the common femoral vein and including the common femoral, femoral, profunda femoral, popliteal and calf veins including the posterior tibial, peroneal and gastrocnemius veins when visible. The superficial great saphenous vein was also interrogated. Spectral Doppler was utilized to evaluate flow at rest and with distal augmentation maneuvers in the common femoral, femoral and popliteal veins. COMPARISON:  None. FINDINGS: Contralateral Common Femoral Vein: Respiratory phasicity is normal and symmetric with the symptomatic side. No evidence of thrombus. Normal compressibility. Common Femoral Vein: No evidence of thrombus. Normal compressibility and color Doppler flow. Saphenofemoral Junction: No evidence of thrombus. Normal compressibility and flow on color Doppler imaging. Profunda Femoral Vein: No evidence of thrombus. Normal compressibility and flow on color Doppler imaging. Femoral Vein: Positive for thrombus. Left femoral vein is not compressible. Echogenic thrombus throughout the left femoral vein. Duplicated femoral veins in the distal thigh are both thrombosed. Left femoral DVT appears to be occlusive. Popliteal Vein: Positive for thrombus. Popliteal DVT appears to be occlusive. Calf Veins: Thrombus in the left posterior tibial veins. Questionable thrombus in some of the peroneal veins. Other Findings: Thrombus extending into the left short saphenous vein. IMPRESSION: Positive for deep venous thrombosis in the left lower extremity. There is thrombus extending from the proximal left femoral vein to the left calf veins. Superficial venous thrombosis involving the left short saphenous vein. Electronically Signed   By: Markus Daft M.D.   On: 09/26/2017  15:22     Assessment and plan- Patient is a 62 y.o. female with following issues:  1. Right ear pain- no signs of acute otitis externa or media. Suspect this may be seasonal/ allergic etiology. Will hold off on atibiotics at this time. She will call if symptoms are getting worse  2. LLE DVT- continue xarelto. Seen by vascular surgery. No indication for thrombolysis or IVC filter  3. SCC oropharynx- pet scheduled for next week and I will see her thereafter   Visit Diagnosis 1. Right ear pain      Dr. Randa Evens, MD, MPH [facility] [phone] 10/14/2017 10:26 AM

## 2017-10-18 ENCOUNTER — Inpatient Hospital Stay: Payer: BLUE CROSS/BLUE SHIELD

## 2017-10-18 ENCOUNTER — Other Ambulatory Visit: Payer: Self-pay

## 2017-10-18 ENCOUNTER — Telehealth: Payer: Self-pay | Admitting: *Deleted

## 2017-10-18 DIAGNOSIS — R5383 Other fatigue: Secondary | ICD-10-CM | POA: Diagnosis not present

## 2017-10-18 DIAGNOSIS — R531 Weakness: Secondary | ICD-10-CM | POA: Diagnosis not present

## 2017-10-18 DIAGNOSIS — R4701 Aphasia: Secondary | ICD-10-CM | POA: Diagnosis not present

## 2017-10-18 DIAGNOSIS — C01 Malignant neoplasm of base of tongue: Secondary | ICD-10-CM | POA: Diagnosis not present

## 2017-10-18 DIAGNOSIS — Z9221 Personal history of antineoplastic chemotherapy: Secondary | ICD-10-CM | POA: Diagnosis not present

## 2017-10-18 DIAGNOSIS — Z7901 Long term (current) use of anticoagulants: Secondary | ICD-10-CM | POA: Diagnosis not present

## 2017-10-18 DIAGNOSIS — I714 Abdominal aortic aneurysm, without rupture: Secondary | ICD-10-CM | POA: Diagnosis not present

## 2017-10-18 DIAGNOSIS — Z79899 Other long term (current) drug therapy: Secondary | ICD-10-CM | POA: Diagnosis not present

## 2017-10-18 DIAGNOSIS — F418 Other specified anxiety disorders: Secondary | ICD-10-CM | POA: Diagnosis not present

## 2017-10-18 DIAGNOSIS — F419 Anxiety disorder, unspecified: Secondary | ICD-10-CM | POA: Diagnosis not present

## 2017-10-18 DIAGNOSIS — C109 Malignant neoplasm of oropharynx, unspecified: Secondary | ICD-10-CM

## 2017-10-18 DIAGNOSIS — Q231 Congenital insufficiency of aortic valve: Secondary | ICD-10-CM | POA: Diagnosis not present

## 2017-10-18 DIAGNOSIS — Z923 Personal history of irradiation: Secondary | ICD-10-CM | POA: Diagnosis not present

## 2017-10-18 DIAGNOSIS — H9201 Otalgia, right ear: Secondary | ICD-10-CM | POA: Diagnosis not present

## 2017-10-18 DIAGNOSIS — R079 Chest pain, unspecified: Secondary | ICD-10-CM | POA: Diagnosis not present

## 2017-10-18 DIAGNOSIS — K219 Gastro-esophageal reflux disease without esophagitis: Secondary | ICD-10-CM | POA: Diagnosis not present

## 2017-10-18 DIAGNOSIS — I82401 Acute embolism and thrombosis of unspecified deep veins of right lower extremity: Secondary | ICD-10-CM | POA: Diagnosis not present

## 2017-10-18 LAB — COMPREHENSIVE METABOLIC PANEL
ALBUMIN: 3.9 g/dL (ref 3.5–5.0)
ALK PHOS: 57 U/L (ref 38–126)
ALT: 18 U/L (ref 14–54)
ANION GAP: 8 (ref 5–15)
AST: 20 U/L (ref 15–41)
BUN: 11 mg/dL (ref 6–20)
CALCIUM: 9.3 mg/dL (ref 8.9–10.3)
CO2: 25 mmol/L (ref 22–32)
CREATININE: 0.69 mg/dL (ref 0.44–1.00)
Chloride: 107 mmol/L (ref 101–111)
GFR calc Af Amer: >60 mL/min (ref >60)
GFR calc non Af Amer: >60 mL/min (ref >60)
GLUCOSE: 91 mg/dL (ref 65–99)
Potassium: 4 mmol/L (ref 3.5–5.1)
Sodium: 140 mmol/L (ref 135–145)
TOTAL PROTEIN: 6.3 g/dL — AB (ref 6.5–8.1)
Total Bilirubin: 1.1 mg/dL (ref 0.3–1.2)

## 2017-10-18 LAB — CBC WITH DIFFERENTIAL/PLATELET
BASOS ABS: 0 10*3/uL (ref 0–0.1)
Basophils Relative: 1 %
EOS PCT: 1 %
Eosinophils Absolute: 0 10*3/uL (ref 0–0.7)
HEMATOCRIT: 33.6 % — AB (ref 35.0–47.0)
Hemoglobin: 11.4 g/dL — ABNORMAL LOW (ref 12.0–16.0)
Lymphocytes Relative: 10 %
Lymphs Abs: 0.3 10*3/uL — ABNORMAL LOW (ref 1.0–3.6)
MCH: 32.6 pg (ref 26.0–34.0)
MCHC: 34 g/dL (ref 32.0–36.0)
MCV: 96 fL (ref 80.0–100.0)
MONO ABS: 0.3 10*3/uL (ref 0.2–0.9)
MONOS PCT: 11 %
NEUTROS ABS: 2.4 10*3/uL (ref 1.4–6.5)
Neutrophils Relative %: 77 %
PLATELETS: 122 10*3/uL — AB (ref 150–440)
RBC: 3.5 MIL/uL — ABNORMAL LOW (ref 3.80–5.20)
RDW: 14.8 % — AB (ref 11.5–14.5)
WBC: 3.1 10*3/uL — ABNORMAL LOW (ref 3.6–11.0)

## 2017-10-18 NOTE — Telephone Encounter (Signed)
Called pt and let her know that pet was denied. I have called [employer] and they told me that the pet has been denied and now we have to ask for reconsider appeal.  The denial letter states that pt with that type of cancer she should have a pet/ct scan at least 12 weeks after she completes chemo. I will await a call back from insurance and call pt and let her know what the status is

## 2017-10-19 ENCOUNTER — Ambulatory Visit: Payer: BLUE CROSS/BLUE SHIELD

## 2017-10-20 ENCOUNTER — Telehealth: Payer: Self-pay | Admitting: *Deleted

## 2017-10-20 DIAGNOSIS — C109 Malignant neoplasm of oropharynx, unspecified: Secondary | ICD-10-CM

## 2017-10-20 NOTE — Telephone Encounter (Signed)
Called pt and let her know that Dr. Janese Banks look up NCCN guidelines and it does approve a PET but it does states that to do it 12 weeks after treatment complete and that was the same thing that the insurance said on the denial.  Dr. Janese Banks agreeable for 12 weeks scan. We have made it for 5/22 at 9 am arrival for 9:30 scan. NPO for 6 hours. Made appt for pt to see md on 6/4 9:30 and she will need labs so come at 9 am.  Patient is agreeable to the plan

## 2017-10-21 ENCOUNTER — Inpatient Hospital Stay: Payer: BLUE CROSS/BLUE SHIELD | Admitting: Oncology

## 2017-10-24 ENCOUNTER — Ambulatory Visit: Payer: BLUE CROSS/BLUE SHIELD

## 2017-10-24 ENCOUNTER — Telehealth: Payer: Self-pay | Admitting: *Deleted

## 2017-10-24 NOTE — Telephone Encounter (Signed)
Patient requesting Wendy Kelly return her call, her insurance has denied the appeal and she needs help on how to proceed. [phone]

## 2017-10-25 ENCOUNTER — Encounter (INDEPENDENT_AMBULATORY_CARE_PROVIDER_SITE_OTHER): Payer: Self-pay | Admitting: Vascular Surgery

## 2017-10-25 DIAGNOSIS — M79675 Pain in left toe(s): Secondary | ICD-10-CM | POA: Diagnosis not present

## 2017-10-25 DIAGNOSIS — L600 Ingrowing nail: Secondary | ICD-10-CM | POA: Diagnosis not present

## 2017-10-25 DIAGNOSIS — M79674 Pain in right toe(s): Secondary | ICD-10-CM | POA: Diagnosis not present

## 2017-11-04 ENCOUNTER — Telehealth: Payer: Self-pay | Admitting: *Deleted

## 2017-11-04 NOTE — Telephone Encounter (Signed)
Patient states her dentist wants to know if she is clear for dental procedures including possible extractions. Please advise

## 2017-11-04 NOTE — Telephone Encounter (Signed)
Ok to go forward from CBC standpoint. But she is on eliquis for her DVT and I would not want to interrupt that until sept 2019.

## 2017-11-04 NOTE — Telephone Encounter (Signed)
Left message on patient voice mail regarding doctor response and ot to have any invasive dental procedures until at least September

## 2017-11-08 ENCOUNTER — Other Ambulatory Visit: Payer: Self-pay | Admitting: *Deleted

## 2017-11-08 ENCOUNTER — Other Ambulatory Visit: Payer: Self-pay | Admitting: Oncology

## 2017-11-08 MED ORDER — SERTRALINE HCL 25 MG PO TABS: 25.0000 mg | ORAL_TABLET | Freq: Every day | ORAL | 0 refills | Status: DC

## 2017-11-08 NOTE — Telephone Encounter (Signed)
Would not recommend anything specific for her allergies. She can speak to her pcp. She can stop claritin if not helping

## 2017-11-08 NOTE — Telephone Encounter (Signed)
Patient called requesting a refill of her sertraline and also asking what she can do about her allergies. She has been taking Claritin, but it is only drying her mouth out and not helping with her symptoms. Please advise

## 2017-11-11 DIAGNOSIS — Z85818 Personal history of malignant neoplasm of other sites of lip, oral cavity, and pharynx: Secondary | ICD-10-CM | POA: Diagnosis not present

## 2017-11-11 DIAGNOSIS — R682 Dry mouth, unspecified: Secondary | ICD-10-CM | POA: Diagnosis not present

## 2017-11-11 DIAGNOSIS — J301 Allergic rhinitis due to pollen: Secondary | ICD-10-CM | POA: Diagnosis not present

## 2017-11-17 ENCOUNTER — Inpatient Hospital Stay: Payer: BLUE CROSS/BLUE SHIELD | Attending: Oncology

## 2017-11-17 DIAGNOSIS — Z85819 Personal history of malignant neoplasm of unspecified site of lip, oral cavity, and pharynx: Secondary | ICD-10-CM | POA: Insufficient documentation

## 2017-11-17 DIAGNOSIS — Z452 Encounter for adjustment and management of vascular access device: Secondary | ICD-10-CM | POA: Diagnosis not present

## 2017-11-17 DIAGNOSIS — Z95828 Presence of other vascular implants and grafts: Secondary | ICD-10-CM

## 2017-11-17 MED ORDER — HEPARIN SOD (PORK) LOCK FLUSH 100 UNIT/ML IV SOLN
500.0000 [IU] | Freq: Once | INTRAVENOUS | Status: AC
  Administered 2017-11-17: 500 [IU] via INTRAVENOUS

## 2017-11-17 MED ORDER — SODIUM CHLORIDE 0.9% FLUSH
10.0000 mL | INTRAVENOUS | Status: DC | PRN
  Administered 2017-11-17: 10 mL via INTRAVENOUS
  Filled 2017-11-17: qty 10

## 2017-11-30 ENCOUNTER — Encounter
Admission: RE | Admit: 2017-11-30 | Discharge: 2017-11-30 | Disposition: A | Discharge status: Home / Self Care | Payer: BLUE CROSS/BLUE SHIELD | Source: Ambulatory Visit | Attending: Oncology | Admitting: Oncology

## 2017-11-30 DIAGNOSIS — C109 Malignant neoplasm of oropharynx, unspecified: Secondary | ICD-10-CM | POA: Diagnosis not present

## 2017-11-30 LAB — GLUCOSE, CAPILLARY: Glucose-Capillary: 68 mg/dL (ref 65–99)

## 2017-11-30 MED ORDER — FLUDEOXYGLUCOSE F - 18 (FDG) INJECTION
7.9600 | Freq: Once | INTRAVENOUS | Status: AC | PRN
  Administered 2017-11-30: 7.96 via INTRAVENOUS

## 2017-12-06 DIAGNOSIS — Z85818 Personal history of malignant neoplasm of other sites of lip, oral cavity, and pharynx: Secondary | ICD-10-CM | POA: Diagnosis not present

## 2017-12-07 ENCOUNTER — Other Ambulatory Visit: Payer: Self-pay | Admitting: *Deleted

## 2017-12-07 MED ORDER — SERTRALINE HCL 25 MG PO TABS: 25.0000 mg | ORAL_TABLET | Freq: Every day | ORAL | 1 refills | Status: DC

## 2017-12-08 ENCOUNTER — Other Ambulatory Visit: Payer: Self-pay | Admitting: Cardiothoracic Surgery

## 2017-12-08 DIAGNOSIS — I712 Thoracic aortic aneurysm, without rupture, unspecified: Secondary | ICD-10-CM

## 2017-12-12 ENCOUNTER — Telehealth: Payer: Self-pay | Admitting: *Deleted

## 2017-12-12 NOTE — Telephone Encounter (Signed)
Ok to go ahead and prescribe. There is moderate interaction and possible increased concentration of xarelto but no action needed per lexicomp. Therefore ok to give it

## 2017-12-12 NOTE — Progress Notes (Signed)
Patient ID: Wendy Kelly, female   DOB: 02-May-1956, 62 y.o.   MRN: [id number] Cardiology Office Note  Date:  12/14/2017   ID:  Wendy Kelly, DOB 08-26-1955, MRN [id number]  PCP:  Wendy Roys, DO   Chief Complaint  Patient presents with  . Other    6 month follow up. Meds reviewed by the pt. verbally. "doing well."     HPI:  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 that has been monitored with echocardiography and CT scans, No significant change since 2010 suspected bicuspid aortic valve on previous echocardiograms (no TEE performed) syncope second-degree AV block and pacemaker placement in November 2006,  episode of TIA-type symptoms with right facial droop, expressive aphasia that resolved,  intermittent chest pain, atypical in nature occasional nausea Chronic orthostasis , which she prefers to manage with fluids, previously Declined medications She presents for routine followup of her chest pain and ascending aorta dilation  New finding of enlarged lymph node right neck concerning for carcinoma on PET scan Concern for 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 Currently on xarelto No leg swelling  Fatigue Blood pressure running low, No orthostasis  CTA chest scheduled 12/2017 PET scan with CT November 2018: ascending aorta dilation not measured PET scan with CT May 2019 : ascending aorta 4.3 cm, though significant artifact from her port  in takes care of her 19 year old mother, no other help from family Previous episodes of chest pain, none recently Prior episodes of tachycardia felt secondary to stress  and anxiety, none recently  EKG on today's visit shows normal sinus rhythm with rate 69 bpm, nonspecific ST abnormality  Other past medical history reviewed Ascending thoracic aortic aneurysm now measuring 48 mm compared to 46 mm in 2015.    Echocardiogram January 2018 Aortic root: The aortic root was dilated at 4.2cm. - Ascending aorta: The ascending aorta was dilated at 4.6cm. No significant change  Last CT chest December 2015, aorta 4.6 cm at that time No change compared to CT scan August 2011, at that time 4.6 x 4.8 cm  Son recently diagnosed with dilated ascending aorta 4.2 cm, he is out of state  She has some atypical chest pain, improved symptoms with accupuncture and chiropractic Some issues with swallowing  CT scan neck reviewed images pulled up in the office today no significant carotid disease, aortic arch disease/calcification  She was seen in December 2015 in the hospital for chest pain. Echocardiogram was unchanged, chest pain felt to be noncardiac. Echocardiogram May and December 2015, relatively no change.   CT scan in November 2013. 4.7 cm ascending aorta at that time  She has had low potassium in the past on visits to the emergency room for chest pain  Last stress test was April 2013 performed for chest pain. Equivocal study, no large regions of ischemia, some attenuation artifact noted.  episode of tachycardia followed by chest pain 05/18/2012. Workup in the hospital was essentially negative. She had workup including carotid ultrasound that showed no significant disease.   Previous lab work shows total cholesterol 151, LDL 84, HDL 55, TSH 1.8  PMH:   has a past medical history of AA (aortic aneurysm) ([place]), Anxiety, AV block, 2nd degree, Bicuspid aortic valve, Cancer ([place]), Chest pain, Depression, Expressive aphasia, Facial numbness, GERD (gastroesophageal reflux disease), Low blood pressure, Presence of permanent cardiac pacemaker, Squamous cell carcinoma  (05/2017), and Syncope.  PSH:    Past Surgical History:  Procedure Laterality Date  . ABDOMINAL WALL MESH  REMOVAL    . bladder tack    . cataract Bilateral 2004  . CHOLECYSTECTOMY  2000  . DILATION AND CURETTAGE OF UTERUS    . DIRECT LARYNGOSCOPY Right 06/16/2017   Procedure: MICRO DIRECT LARYNGOSCOPY WITH BIOPSY OF RIGHT BASE OF TONGUE;  Surgeon: Carloyn Manner, MD;  Location: [facility];  Service: ENT;  Laterality: Right;  . EP IMPLANTABLE DEVICE N/A 06/04/2015   Procedure: PPM Generator Changeout;  Surgeon: Deboraha Sprang, MD;  Location: [facility];  Service: Cardiovascular;  Laterality: N/A;  . ESOPHAGOGASTRODUODENOSCOPY (EGD) WITH PROPOFOL N/A 12/18/2015   Procedure: ESOPHAGOGASTRODUODENOSCOPY (EGD) WITH gastric biopsy and dilation;  Surgeon: Lucilla Lame, MD;  Location: [place];  Service: Endoscopy;  Laterality: N/A;  . EYE SURGERY    . INSERT / REPLACE / REMOVE PACEMAKER    . IR FLUORO GUIDE PORT INSERTION RIGHT  06/22/2017  . PACEMAKER INSERTION  2006   Medtronic Adapta ADDR01  . TONSILLECTOMY    . TONSILLECTOMY    . VAGINAL HYSTERECTOMY  2013   mad/cope    Current Outpatient Medications  Medication Sig Dispense Refill  . aspirin 81 MG tablet Take 81 mg by mouth daily.    Marland Kitchen b complex vitamins tablet Take 1 tablet by mouth daily.    Marland Kitchen BLACK ELDERBERRY,BERRY-FLOWER, PO Take 1 capsule by mouth daily.    Marland Kitchen CRANBERRY SOFT PO Take 2 each by mouth daily.     . Digestive Enzymes (DIGESTIVE ENZYME PO) Take 1-2 capsules by mouth daily.    . montelukast (SINGULAIR) 10 MG tablet Take 10 mg by mouth at bedtime.    . nitroGLYCERIN (NITROSTAT) 0.4 MG SL tablet Place 1 tablet (0.4 mg total) under the tongue every 5 (five) minutes as needed for chest pain. 25 tablet 3  . NON FORMULARY Take by mouth daily. Bone Up (calcium)     . polyethylene glycol (MIRALAX / GLYCOLAX) packet Take 17 g by mouth daily.    Marland Kitchen PRESCRIPTION MEDICATION Place 1 tablet vaginally 3 (three) times  a week. E3 1mg  and Testosterone 1mg  compound    . rivaroxaban (XARELTO) 20 MG TABS tablet Take 1 tablet (20 mg total) by mouth daily with supper. 30 tablet 2  . sertraline (ZOLOFT) 25 MG tablet Take 1 tablet (25 mg total) by mouth daily. 90 tablet 1   No current facility-administered medications for this visit.    Facility-Administered Medications Ordered in Other Visits  Medication Dose Route Frequency Provider Last Rate Last Dose  . 0.9 %  sodium chloride infusion   Intravenous Once Sindy Guadeloupe, MD         Allergies:   Cefdinir; Cephalosporins; Dairy aid [lactase]; Flagyl [metronidazole]; Lactose intolerance (gi); Penicillins; Phenylephrine-guaifenesin; Sulfa antibiotics; Sulfonamide derivatives; Tape; Dexilant [dexlansoprazole]; Entex lq [phenylephrine-guaifenesin]; Other; Oxycodone; Succinylcholine; Tetracycline; Codeine; Guaifenesin & derivatives; Lactobacillus; Naproxen sodium; Pseudoephedrine; Tetracyclines & related; and Wheat bran   Social History:  The patient  reports that she has never smoked. She has never used smokeless tobacco. She reports that she does not drink alcohol or use drugs.   Family History:  family history includes Atrial fibrillation in her mother; Breast cancer (age of onset: 68) in her maternal aunt; COPD in her maternal grandmother and mother; Stroke in her father.    Review of Systems: Review of Systems  Constitutional: Positive for weight loss.  Respiratory: Negative.   Cardiovascular: Negative.   Gastrointestinal: Negative.   Musculoskeletal: Negative.   Neurological: Negative.   Psychiatric/Behavioral: Negative.   All other systems reviewed and are negative.    PHYSICAL EXAM: VS:  BP 90/62 (BP Location: Left Arm, Patient Position: Sitting, Cuff Size: Normal)   Pulse 69   Ht 5\' 3"  (1.6 m)   Wt 143 lb 12 oz (65.2 kg)   BMI 25.46 kg/m  , BMI Body mass index is 25.46 kg/m. Constitutional:  oriented to person, place, and time. No distress.   HENT:  Head: Normocephalic and atraumatic.  Eyes:  no discharge. No scleral icterus.  Neck: Normal range of motion. Neck supple. No JVD present.  Cardiovascular: Normal rate, regular rhythm, normal heart sounds and intact distal pulses. Exam reveals no gallop and no friction rub. No edema 1/6 SEM RSB   Pulmonary/Chest: Effort normal and breath sounds normal. No stridor. No respiratory distress.  no wheezes.  no rales.  no tenderness.  Abdominal: Soft.  no distension.  no tenderness.  Musculoskeletal: Normal range of motion.  no  tenderness or deformity.  Neurological:  normal muscle tone. Coordination normal. No atrophy Skin: Skin is warm and dry. No rash noted. not diaphoretic.  Psychiatric:  normal mood and affect. behavior is normal. Thought content normal.    Recent Labs: 04/20/2017: TSH 1.840 09/13/2017: Magnesium 2.2 12/13/2017: ALT 22; BUN 14; Creatinine, Ser 0.74; Hemoglobin 13.1; Platelets 130; Potassium 3.8; Sodium 142    Lipid Panel Lab Results  Component Value Date   CHOL 145 04/20/2017   HDL 54 04/20/2017   LDLCALC 79 04/20/2017   TRIG 58 04/20/2017      Wt Readings from Last 3 Encounters:  12/14/17 143 lb 12 oz (65.2 kg)  12/13/17 144 lb 6.4 oz (65.5 kg)  11/30/17 145 lb (65.8 kg)       ASSESSMENT AND PLAN:  Ascending aortic aneurysm ([place]) - Followed in [facility] by CT surgery Aorta has been stable for approximately 10 years She has had CT scanning June 2018, PET/CT November 2018, PET/CT May 2019 All the images above reviewed by myself, difficult to say though likely no significant increase in size She is concerned about having repeat CT scan end of June given 3 recent scans in the past year, and 35 rounds of radiation for cancer We will reach out to CT surgery to see if we could delay repeat scan given the above  Palpitations -   pacemaker in place Overall feels well and  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onic -  Managed by Dr. Caryl Comes, previous  generator change  out  Orthostatic hypotension Exacerbated recently by 40 pound weight loss, during cancer treatment Currently asymptomatic Recommended aggressive hydration, limiting further weight loss  AV block, 2nd degree History of pacemaker  followed by Dr. Caryl Comes   Total encounter time more than 45 minutes  Greater than 50% was spent in counseling and coordination of care with the patient  Disposition:   F/U  12 months   Orders Placed This Encounter  Procedures  . EKG 12-Lead     Signed, Esmond Plants, M.D., Ph.D. 12/14/2017  [place], [place]

## 2017-12-12 NOTE — Telephone Encounter (Signed)
Dr Pryor Ochoa wants to prescribe Diflucan, but there is an interaction with her Xarelto. He would like a suggestion of whethreethis is ok or what else he can prescribe for her. Please advise

## 2017-12-12 NOTE — Telephone Encounter (Signed)
Call returned to [place] and informed of Dr Janese Banks response. Had to leave message on voice mail

## 2017-12-13 ENCOUNTER — Other Ambulatory Visit: Payer: Self-pay

## 2017-12-13 ENCOUNTER — Inpatient Hospital Stay (HOSPITAL_BASED_OUTPATIENT_CLINIC_OR_DEPARTMENT_OTHER): Payer: BLUE CROSS/BLUE SHIELD | Admitting: Oncology

## 2017-12-13 ENCOUNTER — Ambulatory Visit (INDEPENDENT_AMBULATORY_CARE_PROVIDER_SITE_OTHER): Payer: BLUE CROSS/BLUE SHIELD | Admitting: *Deleted

## 2017-12-13 ENCOUNTER — Inpatient Hospital Stay: Payer: BLUE CROSS/BLUE SHIELD | Attending: Oncology

## 2017-12-13 ENCOUNTER — Encounter: Payer: Self-pay | Admitting: Oncology

## 2017-12-13 VITALS — BP 100/64 | HR 72 | Temp 97.6°F | Resp 18 | Ht 64.5 in | Wt 144.4 lb

## 2017-12-13 DIAGNOSIS — Q231 Congenital insufficiency of aortic valve: Secondary | ICD-10-CM | POA: Diagnosis not present

## 2017-12-13 DIAGNOSIS — Z86718 Personal history of other venous thrombosis and embolism: Secondary | ICD-10-CM | POA: Diagnosis not present

## 2017-12-13 DIAGNOSIS — Z9049 Acquired absence of other specified parts of digestive tract: Secondary | ICD-10-CM | POA: Insufficient documentation

## 2017-12-13 DIAGNOSIS — Z9221 Personal history of antineoplastic chemotherapy: Secondary | ICD-10-CM

## 2017-12-13 DIAGNOSIS — Z7982 Long term (current) use of aspirin: Secondary | ICD-10-CM | POA: Insufficient documentation

## 2017-12-13 DIAGNOSIS — Z923 Personal history of irradiation: Secondary | ICD-10-CM | POA: Diagnosis not present

## 2017-12-13 DIAGNOSIS — R87820 Cervical low risk human papillomavirus (HPV) DNA test positive: Secondary | ICD-10-CM | POA: Diagnosis not present

## 2017-12-13 DIAGNOSIS — C109 Malignant neoplasm of oropharynx, unspecified: Secondary | ICD-10-CM

## 2017-12-13 DIAGNOSIS — K219 Gastro-esophageal reflux disease without esophagitis: Secondary | ICD-10-CM | POA: Diagnosis not present

## 2017-12-13 DIAGNOSIS — Z79899 Other long term (current) drug therapy: Secondary | ICD-10-CM | POA: Diagnosis not present

## 2017-12-13 DIAGNOSIS — H6123 Impacted cerumen, bilateral: Secondary | ICD-10-CM | POA: Diagnosis not present

## 2017-12-13 DIAGNOSIS — Z7901 Long term (current) use of anticoagulants: Secondary | ICD-10-CM

## 2017-12-13 DIAGNOSIS — I712 Thoracic aortic aneurysm, without rupture: Secondary | ICD-10-CM | POA: Diagnosis not present

## 2017-12-13 DIAGNOSIS — F419 Anxiety disorder, unspecified: Secondary | ICD-10-CM | POA: Diagnosis not present

## 2017-12-13 DIAGNOSIS — F329 Major depressive disorder, single episode, unspecified: Secondary | ICD-10-CM

## 2017-12-13 DIAGNOSIS — B379 Candidiasis, unspecified: Secondary | ICD-10-CM | POA: Diagnosis not present

## 2017-12-13 DIAGNOSIS — K5730 Diverticulosis of large intestine without perforation or abscess without bleeding: Secondary | ICD-10-CM

## 2017-12-13 DIAGNOSIS — Z950 Presence of cardiac pacemaker: Secondary | ICD-10-CM | POA: Diagnosis not present

## 2017-12-13 DIAGNOSIS — R55 Syncope and collapse: Secondary | ICD-10-CM | POA: Diagnosis not present

## 2017-12-13 DIAGNOSIS — Z85819 Personal history of malignant neoplasm of unspecified site of lip, oral cavity, and pharynx: Secondary | ICD-10-CM

## 2017-12-13 DIAGNOSIS — I82412 Acute embolism and thrombosis of left femoral vein: Secondary | ICD-10-CM

## 2017-12-13 LAB — CBC WITH DIFFERENTIAL/PLATELET
BASOS ABS: 0 10*3/uL (ref 0–0.1)
BASOS PCT: 1 %
Eosinophils Absolute: 0 10*3/uL (ref 0–0.7)
Eosinophils Relative: 1 %
HCT: 38.1 % (ref 35.0–47.0)
Hemoglobin: 13.1 g/dL (ref 12.0–16.0)
LYMPHS PCT: 15 %
Lymphs Abs: 0.5 10*3/uL — ABNORMAL LOW (ref 1.0–3.6)
MCH: 31.4 pg (ref 26.0–34.0)
MCHC: 34.4 g/dL (ref 32.0–36.0)
MCV: 91.4 fL (ref 80.0–100.0)
MONO ABS: 0.3 10*3/uL (ref 0.2–0.9)
Monocytes Relative: 9 %
NEUTROS PCT: 74 %
Neutro Abs: 2.2 10*3/uL (ref 1.4–6.5)
Platelets: 130 10*3/uL — ABNORMAL LOW (ref 150–440)
RBC: 4.17 MIL/uL (ref 3.80–5.20)
RDW: 13.2 % (ref 11.5–14.5)
WBC: 3 10*3/uL — AB (ref 3.6–11.0)

## 2017-12-13 LAB — COMPREHENSIVE METABOLIC PANEL
ALBUMIN: 4 g/dL (ref 3.5–5.0)
ALK PHOS: 68 U/L (ref 38–126)
ALT: 22 U/L (ref 14–54)
ANION GAP: 8 (ref 5–15)
AST: 23 U/L (ref 15–41)
BUN: 14 mg/dL (ref 6–20)
CALCIUM: 9.5 mg/dL (ref 8.9–10.3)
CO2: 27 mmol/L (ref 22–32)
Chloride: 107 mmol/L (ref 101–111)
Creatinine, Ser: 0.74 mg/dL (ref 0.44–1.00)
Glucose, Bld: 88 mg/dL (ref 65–99)
POTASSIUM: 3.8 mmol/L (ref 3.5–5.1)
Sodium: 142 mmol/L (ref 135–145)
TOTAL PROTEIN: 6.4 g/dL — AB (ref 6.5–8.1)
Total Bilirubin: 1 mg/dL (ref 0.3–1.2)

## 2017-12-13 NOTE — Progress Notes (Signed)
Remote pacemaker transmission.   

## 2017-12-13 NOTE — Progress Notes (Signed)
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 Consuela Mimes evaluation of right neck mass. The mass has been present for or a year and has been growing gradually. It was associated with some right ear pain but no fever. Patient has been a never smoker.Ultrasound on 05/30/2017 showed:Single enlarged lymph node in the right neck measures approximately 4.8 x 1.7 x 3.0 cm  2.Patient underwent IR guided lymph node biopsy of the cervical lymph node which showed metastatic squamous cell carcinoma. Staining for P 16 was positive and sox 10 was negative   3.PET/CT scan on 06/09/2017 showed intensely hypermetabolic enlarged right level 2 cervical lymph node with an SUV of 11.1. No clear site of primary malignancy within the head and neck. Potential site of investigation would include right base of tongue. There was asymmetric metabolic activity noted in that area with an SUV of 4.5. Less suspicious region in the posterior glottis. No evidence of distant metastatic disease.  4.Patient works as a Geophysicist/field seismologist at [employer]. Patient has had multiple allergies to medications in the past. Reports that when she took steroids in the past it made her quite aggravated. Her sister was hospitalized in a mental health facility after receiving a steroid course in the past.  5. Right tongue base biopsy positive for SCC.Baseline audiogram normal. Concurrent chemo/RT  completed on 09/05/17.  6. RLE DVT on 09/27/17. Currently on xarelto    Interval history-she reports doing significantly better and is almost working full-time.  Her left lower extremity swelling has improved although it persists.  Her symptoms of anxiety have almost completely resolved.  She is not taking PRN Ativan but she is still on Zoloft.  She reports that her tongue is sore.  Her appetite is good and she is able to eat normally  ECOG PS- 0 Pain scale- 0   Review of systems- Review of Systems  Constitutional: Negative for chills, fever, malaise/fatigue and weight loss.  HENT: Negative for congestion, ear discharge and nosebleeds.        Tongue soreness  Eyes: Negative for blurred vision.  Respiratory: Negative for cough, hemoptysis, sputum production, shortness of breath and wheezing.   Cardiovascular: Negative for chest pain, palpitations, orthopnea and claudication.  Gastrointestinal: Negative for abdominal pain, blood in stool, constipation, diarrhea, heartburn, melena, nausea and vomiting.  Genitourinary: Negative for dysuria, flank pain, frequency, hematuria and urgency.  Musculoskeletal: Negative for back pain, joint pain and myalgias.  Skin: Negative for rash.  Neurological: Negative for dizziness, tingling, focal weakness, seizures, weakness and headaches.  Endo/Heme/Allergies: Does not bruise/bleed easily.  Psychiatric/Behavioral: Negative for depression and suicidal ideas. The patient does not have insomnia.       Allergies  Allergen Reactions  . Cefdinir Swelling    Swelling in mouth   . Cephalosporins Swelling  . Dairy Aid [Lactase] Swelling    Milk and milk products cause swelling and tingling of tongue  . Flagyl [Metronidazole] Swelling    Mouth and throat and ears go red and burn  .  Lactose Intolerance (Gi) Other (See Comments)    GI upset.  Eats only gluten free  . Penicillins Rash    Rash/hives  . Phenylephrine-Guaifenesin Anaphylaxis    Night terrors   . Sulfa Antibiotics Rash and Hives    Other reaction(s): Unknown   . Sulfonamide Derivatives Rash    Rash/itching.  From head to her toes, the itching was horrible. Penicillin is the worst  . Tape Rash    Adhesives all cause a problem severely.  It doesn't matter if it is paper tape or not.  Marland Kitchen Dexilant [Dexlansoprazole]     Pressure in head like head is in a vice and being squeezed  . Entex Lq [Phenylephrine-Guaifenesin]     Night terrors  . Other Other (See Comments)    Flu vaccine:  Weakness, dizziness, tia type symptoms.    . Oxycodone Nausea And Vomiting    And itching  . Succinylcholine Other (See Comments)    Trouble waking up  . Tetracycline Other (See Comments)    Unkown  . Codeine Nausea And Vomiting  . Guaifenesin & Derivatives Other (See Comments)    Night terrors  . Lactobacillus Itching  . Naproxen Sodium     Itching/rash  . Pseudoephedrine Rash  . Tetracyclines & Related Other (See Comments)    Was taking flagyl at the same time; unsure which caused the swelling  Swelling of throat. Pharmacist states that he believes it was the flagyl and not the tetracycline  . Wheat Bran Other (See Comments)    Upset stomach. Now eats gluten free     Past Medical History:  Diagnosis Date  . AA (aortic aneurysm) ([place])    a. 04/2011 Echo: Ao Root: 4.1cm, Asc Ao 4.7cm.  . Anxiety   . AV block, 2nd degree    a. 05/2005 - s/p MDT Adapta ADDR01 Dual Chamber PPM  . Bicuspid aortic valve   . Cancer ([place])   . Chest pain    a. 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  . Depression   . Expressive aphasia    a. ongoing since 04/2011 - seen by neurology - ? TIA vs. Migraine  . Facial numbness    a. ongoing since 04/2011 - seen by neurology - ? TIA vs. Migraine  . GERD (gastroesophageal reflux disease)   . Low blood pressure   . Presence of permanent cardiac pacemaker   . 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 Syncope    a. 04/2011 Echo: EF 55-65%, No RWMA, Gr 1  DD.     Past Surgical History:  Procedure Laterality Date  . ABDOMINAL WALL MESH  REMOVAL    . bladder tack    . cataract Bilateral 2004  . CHOLECYSTECTOMY  2000  . DILATION AND CURETTAGE OF UTERUS    . DIRECT LARYNGOSCOPY Right 06/16/2017   Procedure: MICRO DIRECT LARYNGOSCOPY WITH BIOPSY OF RIGHT BASE OF TONGUE;  Surgeon: Carloyn Manner, MD;  Location: [facility];  Service: ENT;  Laterality: Right;  . EP IMPLANTABLE DEVICE N/A 06/04/2015   Procedure: PPM Generator Changeout;  Surgeon: Deboraha Sprang, MD;  Location: [place] [facility];  Service: Cardiovascular;  Laterality: N/A;  . ESOPHAGOGASTRODUODENOSCOPY (EGD) WITH PROPOFOL N/A 12/18/2015   Procedure: ESOPHAGOGASTRODUODENOSCOPY (EGD) WITH gastric biopsy and dilation;  Surgeon: Lucilla Lame, MD;  Location: [place];  Service: Endoscopy;  Laterality: N/A;  . EYE SURGERY    . INSERT / REPLACE / REMOVE PACEMAKER    .  IR FLUORO GUIDE PORT INSERTION RIGHT  06/22/2017  . PACEMAKER INSERTION  2006   Medtronic Adapta ADDR01  . TONSILLECTOMY    . TONSILLECTOMY    . VAGINAL HYSTERECTOMY  2013   mad/cope    Social History   Socioeconomic History  . Marital status: Married    Spouse name: Not on file  . Number of children: Not on file  . Years of education: Not on file  . Highest education level: Not on file  Occupational History  . Not on file  Social Needs  . Financial resource strain: Not on file  . Food insecurity:    Worry: Not on file    Inability: Not on file  . Transportation needs:    Medical: Not on file    Non-medical: Not on file  Tobacco Use  . Smoking status: Never Smoker  . Smokeless tobacco: Never Used  . Tobacco comment: tobacco use -no  Substance and Sexual Activity  . Alcohol use: No  . Drug use: No  . Sexual activity: Yes    Birth control/protection: Surgical  Lifestyle  . Physical activity:    Days per week: Not on file    Minutes per session: Not on file  . Stress: Not on file    Relationships  . Social connections:    Talks on phone: Not on file    Gets together: Not on file    Attends religious service: Not on file    Active member of club or organization: Not on file    Attends meetings of clubs or organizations: Not on file    Relationship status: Not on file  . Intimate partner violence:    Fear of current or ex partner: Not on file    Emotionally abused: Not on file    Physically abused: Not on file    Forced sexual activity: Not on file  Other Topics Concern  . Not on file  Social History Narrative   Lives in [place] with husband.  Works out regularly.  Works as a Geophysicist/field seismologist - owns own business.     Family History  Problem Relation Age of Onset  . COPD Mother        alive @ 61  . Atrial fibrillation Mother   . Stroke Father        died @ 42  . Breast cancer Maternal Aunt 70  . COPD Maternal Grandmother   . Ovarian cancer Neg Hx   . Diabetes Neg Hx      Current Outpatient Medications:  .  aspirin 81 MG tablet, Take 81 mg by mouth daily., Disp: , Rfl:  .  b complex vitamins tablet, Take 1 tablet by mouth daily., Disp: , Rfl:  .  BLACK ELDERBERRY,BERRY-FLOWER, PO, Take 1 capsule by mouth daily., Disp: , Rfl:  .  CRANBERRY SOFT PO, Take 2 each by mouth daily. , Disp: , Rfl:  .  Digestive Enzymes (DIGESTIVE ENZYME PO), Take 1-2 capsules by mouth daily., Disp: , Rfl:  .  fluconazole (DIFLUCAN) 100 MG tablet, Take 1 tablet (100 mg total) by mouth daily., Disp: 5 tablet, Rfl: 0 .  loratadine (CLARITIN) 10 MG tablet, Take 10 mg by mouth daily as needed. , Disp: , Rfl:  .  NON FORMULARY, Take by mouth daily. Bone Up (calcium) , Disp: , Rfl:  .  polyethylene glycol (MIRALAX / GLYCOLAX) packet, Take 17 g by mouth daily., Disp: , Rfl:  .  PRESCRIPTION MEDICATION, Place  1 tablet vaginally 3 (three) times a week. E3 1mg  and Testosterone 1mg  compound, Disp: , Rfl:  .  Rivaroxaban (XARELTO) 15 MG TABS tablet, Take 15 mg by mouth 2 (two)  times daily with a meal., Disp: , Rfl:  .  rivaroxaban (XARELTO) 20 MG TABS tablet, Take 1 tablet (20 mg total) by mouth daily with supper., Disp: 30 tablet, Rfl: 2 .  sertraline (ZOLOFT) 25 MG tablet, Take 1 tablet (25 mg total) by mouth daily., Disp: 90 tablet, Rfl: 1 .  fexofenadine (ALLEGRA) 60 MG tablet, Take 60 mg by mouth daily as needed. , Disp: , Rfl:  .  fluticasone (FLONASE SENSIMIST) 27.5 MCG/SPRAY nasal spray, Place 1 spray into the nose daily as needed. , Disp: , Rfl:  .  HYDROcodone-acetaminophen (HYCET) 7.5-325 mg/15 ml solution, Take 10 mLs by mouth 4 (four) times daily as needed for moderate pain. (Patient not taking: Reported on 12/13/2017), Disp: 300 mL, Rfl: 0 .  lidocaine-prilocaine (EMLA) cream, Apply to affected area once (Patient not taking: Reported on 12/13/2017), Disp: 30 g, Rfl: 3 .  LORazepam (ATIVAN) 0.5 MG tablet, TAKE 1 TABLET BY MOUTH EVERY 6 HOURS AS NEEDED FOR NAUSEA AND VOMITING (Patient not taking: Reported on 12/13/2017), Disp: 30 tablet, Rfl: 0 .  magic mouthwash SOLN, Take 5 mLs by mouth 4 (four) times daily as needed for mouth pain. Swish and swallow (Patient not taking: Reported on 12/13/2017), Disp: 420 mL, Rfl: 1 .  nitroGLYCERIN (NITROSTAT) 0.4 MG SL tablet, Place 1 tablet (0.4 mg total) under the tongue every 5 (five) minutes as needed for chest pain. (Patient not taking: Reported on 12/13/2017), Disp: 25 tablet, Rfl: 3 .  ondansetron (ZOFRAN) 8 MG tablet, Take 1 tablet (8 mg total) by mouth 2 (two) times daily as needed. Start on the third day after chemotherapy. (Patient not taking: Reported on 12/13/2017), Disp: 30 tablet, Rfl: 1 .  Polyethyl Glycol-Propyl Glycol (SYSTANE) 0.4-0.3 % GEL ophthalmic gel, Place 1 application into both eyes daily as needed., Disp: , Rfl:  .  prochlorperazine (COMPAZINE) 10 MG tablet, Take 1 tablet (10 mg total) by mouth every 6 (six) hours as needed (Nausea or vomiting). (Patient not taking: Reported on 12/13/2017), Disp: 30 tablet, Rfl:  1 .  sucralfate (CARAFATE) 1 g tablet, Take 1 tablet (1 g total) by mouth 3 (three) times daily. Dissolve in 2-3 tbsp warm water, swish and swallow. (Patient not taking: Reported on 12/13/2017), Disp: 90 tablet, Rfl: 3 No current facility-administered medications for this visit.   Facility-Administered Medications Ordered in Other Visits:  .  0.9 %  sodium chloride infusion, , Intravenous, Once, Sindy Guadeloupe, MD  Physical exam:  Vitals:   12/13/17 0919  BP: 100/64  Pulse: 72  Resp: 18  Temp: 97.6 F (36.4 C)  TempSrc: Tympanic  SpO2: 100%  Weight: 144 lb 6.4 oz (65.5 kg)  Height: 5' 4.5" (1.638 m)   Physical Exam  Constitutional: She is oriented to person, place, and time.  HENT:  Head: Normocephalic and atraumatic.  Mouth/Throat: Oropharynx is clear and moist.  Patient has a coated tongue on the posterior aspect but no definite thrush noted  Eyes: Pupils are equal, round, and reactive to light. EOM are normal.  Neck: Normal range of motion.  Cardiovascular: Normal rate, regular rhythm and normal heart sounds.  Pulmonary/Chest: Effort normal and breath sounds normal.  Abdominal: Soft. Bowel sounds are normal.  Musculoskeletal:  left lower extremity appears swollen but improved as compared to  before  Lymphadenopathy:  No palpable cervical adenopathy  Neurological: She is alert and oriented to person, place, and time.  Skin: Skin is warm and dry.     CMP Latest Ref Rng & Units 12/13/2017  Glucose 65 - 99 mg/dL 88  BUN 6 - 20 mg/dL 14  Creatinine 0.44 - 1.00 mg/dL 0.74  Sodium 135 - 145 mmol/L 142  Potassium 3.5 - 5.1 mmol/L 3.8  Chloride 101 - 111 mmol/L 107  CO2 22 - 32 mmol/L 27  Calcium 8.9 - 10.3 mg/dL 9.5  Total Protein 6.5 - 8.1 g/dL 6.4(L)  Total Bilirubin 0.3 - 1.2 mg/dL 1.0  Alkaline Phos 38 - 126 U/L 68  AST 15 - 41 U/L 23  ALT 14 - 54 U/L 22   CBC Latest Ref Rng & Units 12/13/2017  WBC 3.6 - 11.0 K/uL 3.0(L)  Hemoglobin 12.0 - 16.0 g/dL 13.1  Hematocrit  35.0 - 47.0 % 38.1  Platelets 150 - 440 K/uL 130(L)    No images are attached to the encounter.  Nm Pet Image Restag (ps) Skull Base To Thigh  Result Date: 11/30/2017 CLINICAL DATA:  Subsequent treatment strategy for squamous cell carcinoma of the oropharynx. Last chemotherapy/radiation therapy 12 weeks ago. EXAM: NUCLEAR MEDICINE PET SKULL BASE TO THIGH TECHNIQUE: 8.0 mCi F-18 FDG was injected intravenously. Full-ring PET imaging was performed from the skull base to thigh after the radiotracer. CT data was obtained and used for attenuation correction and anatomic localization. Fasting blood glucose: 68 mg/dl COMPARISON:  Multiple exams, including 06/09/2017 FINDINGS: Mediastinal blood pool activity: SUV max 2.1 NECK: The the previous 2.1 cm hypermetabolic right level II lymph node has completely resolved. No significant residual measurable lymph node or abnormal activity in this vicinity. No current abnormal activity in the neck. Incidental CT findings: none CHEST: No significant abnormal hypermetabolic activity in this region. Incidental CT findings: Right Port-A-Cath tip: Cavoatrial junction. Dual lead pacer noted. Mild cardiomegaly. Ascending aortic aneurysm 4.3 cm in diameter, stable. ABDOMEN/PELVIS: No significant abnormal hypermetabolic activity in this region. Incidental CT findings: Stable small hypodensities in the liver. Cholecystectomy. Mild abdominal aortic atherosclerotic vascular disease. Sigmoid colon diverticulosis. Portosystemic vascular shunting in the left abdomen and pelvis. SKELETON: No significant abnormal hypermetabolic activity in this region. Incidental CT findings: none IMPRESSION: 1. Complete resolution of the previous hypermetabolic right level II a adenopathy. No current abnormal metabolic activity is observed to suggest active malignancy. 2. Ascending aortic aneurysm 4.3 cm in diameter. Recommend annual imaging followup by CTA or MRA. This recommendation follows 2010  ACCF/AHA/AATS/ACR/ASA/SCA/SCAI/SIR/STS/SVM Guidelines for the Diagnosis and Management of Patients with Thoracic Aortic Disease. Circulation. 2010; 121: Q761-P509. Aortic aneurysm NOS (ICD10-I71.9). 3. Other imaging findings of potential clinical significance: Aortic Atherosclerosis (ICD10-I70.0). Mild cardiomegaly. Stable small hypodense lesions in the liver, most likely to be benign. Sigmoid colon diverticulosis. Portosystemic vascular shunting in the left abdomen/pelvis. Electronically Signed   By: Van Clines M.D.   On: 11/30/2017 14:08     Assessment and plan- Patient is a 62 y.o. female with history of HPV positive squamous cell carcinoma of the oropharynx stage I status post concurrent chemoradiation here to discuss the results of the PET CT scan     I have reviewed the PET/CT images independently and discussed the results with the patient.patient has responded very well to concurrent chemoradiation and there is no evidence of disease noted on her current PET scan.  From this point onwards patient would need routine history and physical as well  as ENT exams but does not need surveillance imaging in the future.    Patient will continue to get her port flushes every 6 to 8 weeks until she can get her port removed this needs to wait until September as she has left lower extremity DVT and is currently on Xarelto which cannot be interrupted..   Left lower extremity DVT: Continue Xarelto until September 2019   I will see her back in September with a CBC with differential and discuss stopping Xarelto at that time as well as port removal. On today's exam I do not appreciate any oral thrush.  She does have a mildly coated tongue but no obvious evidence of thrush.  I have advised her to keep the prescription of Diflucan given by ENT.  If her symptoms of tongue pain get worse she can consider taking it for possible thrush  Her anxiety symptoms have also resolved at this time and therefore she can  come off Zoloft by a gradual taper over the next 1 week   Visit Diagnosis 1. Squamous cell carcinoma of oropharynx ([place])   2. DVT of deep femoral vein, left ([facility])   3. Anxiety      Dr. Randa Evens, MD, MPH [facility] [phone] 12/13/2017 11:49 AM

## 2017-12-13 NOTE — Progress Notes (Signed)
No new changes noted today 

## 2017-12-14 ENCOUNTER — Encounter: Payer: Self-pay | Admitting: Cardiovascular Disease

## 2017-12-14 ENCOUNTER — Ambulatory Visit (INDEPENDENT_AMBULATORY_CARE_PROVIDER_SITE_OTHER): Payer: BLUE CROSS/BLUE SHIELD | Admitting: Cardiovascular Disease

## 2017-12-14 VITALS — BP 90/62 | HR 69 | Ht 63.0 in | Wt 143.8 lb

## 2017-12-14 DIAGNOSIS — C109 Malignant neoplasm of oropharynx, unspecified: Secondary | ICD-10-CM

## 2017-12-14 DIAGNOSIS — I712 Thoracic aortic aneurysm, without rupture: Secondary | ICD-10-CM

## 2017-12-14 DIAGNOSIS — I82592 Chronic embolism and thrombosis of other specified deep vein of left lower extremity: Secondary | ICD-10-CM | POA: Diagnosis not present

## 2017-12-14 DIAGNOSIS — I7121 Aneurysm of the ascending aorta, without rupture: Secondary | ICD-10-CM

## 2017-12-14 DIAGNOSIS — I951 Orthostatic hypotension: Secondary | ICD-10-CM

## 2017-12-14 NOTE — Patient Instructions (Signed)

## 2017-12-15 ENCOUNTER — Telehealth: Payer: Self-pay

## 2017-12-15 NOTE — Telephone Encounter (Signed)
-----   Message from Ivin Poot, MD sent at 12/15/2017 11:11 AM EDT ----- Cancel CT I can see the aorta on the CT portion of the PET scan - thankyou PVT ----- Message ----- From: Donnella Sham, RN Sent: 12/15/2017   9:57 AM To: Ivin Poot, MD  Patient called and stated that her Cardiologist, Dr. Rockey Situ requested she not have another CT done this year due to the over exposure to radiation and cancer treatments.  She did say that she had a PET scan done 11/30/2017 (in [place]) which measures the aneurysm.  Also, she said that Dr. Rockey Situ stated he was going to also speak with you about this.  Is it ok to cancel the CT for her appointment with you on 01/04/2018?  Thanks,  Caryl Pina

## 2017-12-15 NOTE — Telephone Encounter (Signed)
Patient called and left a message stating that she did not have to have a CT done prior to appointment.

## 2017-12-24 ENCOUNTER — Encounter (INDEPENDENT_AMBULATORY_CARE_PROVIDER_SITE_OTHER): Payer: Self-pay | Admitting: Vascular Surgery

## 2017-12-26 IMAGING — US US SOFT TISSUE HEAD/NECK
1 series · 14 of 21 positions shown · non-contrast
Comparison: Neck CT - 12/03/2015.

CLINICAL DATA: Lymphadenopathy for the past 6 months.

EXAM:
ULTRASOUND OF HEAD/NECK SOFT TISSUES
TECHNIQUE: Ultrasound examination of the head and neck soft tissues was
performed in the area of clinical concern.

[Series 1: us soft tissue head/neck · 0.07mm/px · 21 acquisitions, 14 frames shown]
[im 1/21]
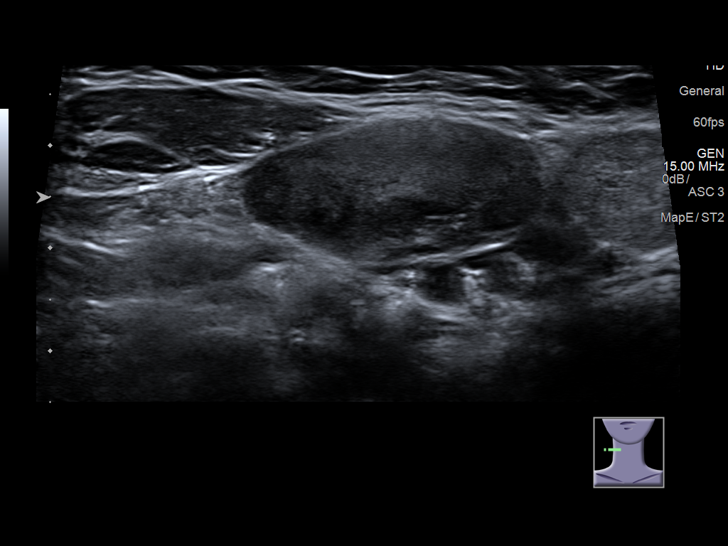
[im 3/21]
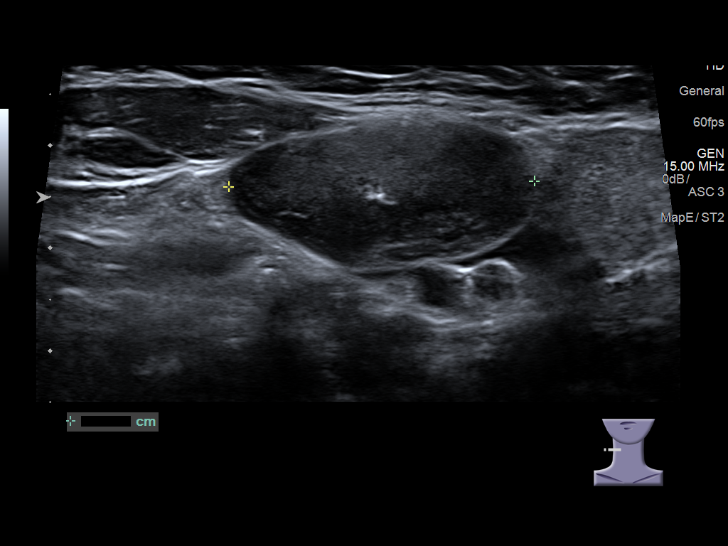
[im 4/21]
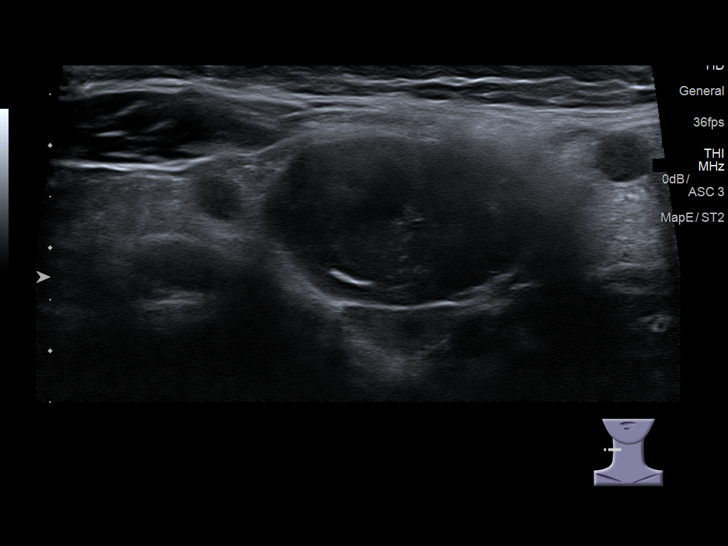
[im 6/21]
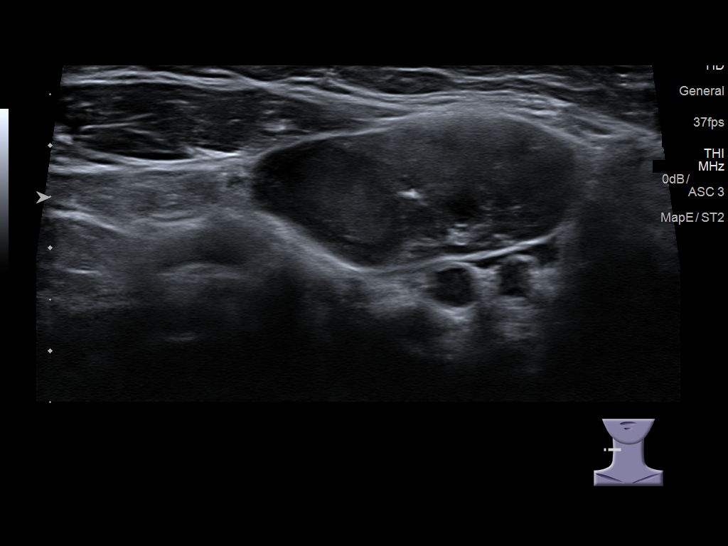
[im 7/21]
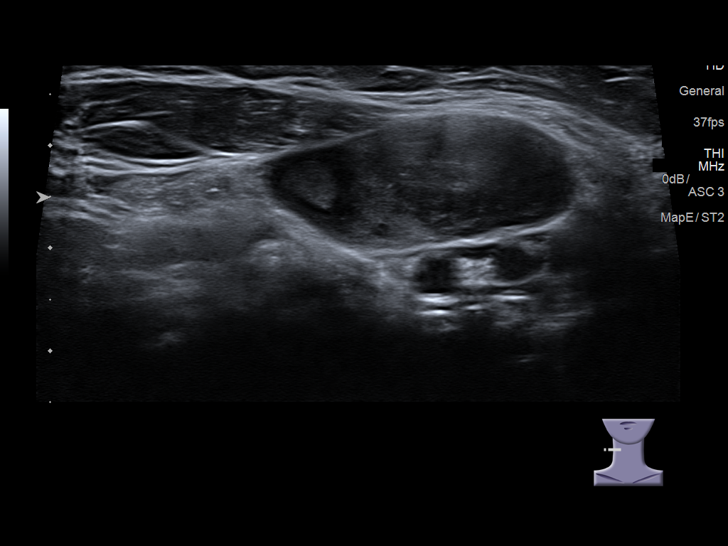
[im 9/21]
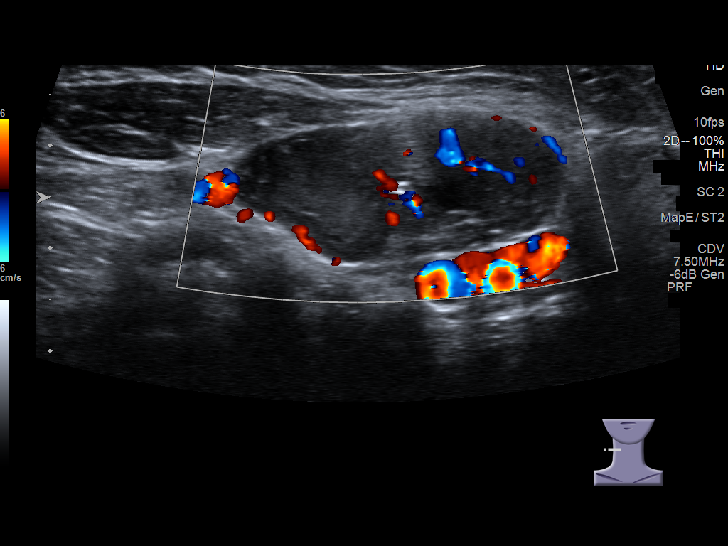
[im 10/21]
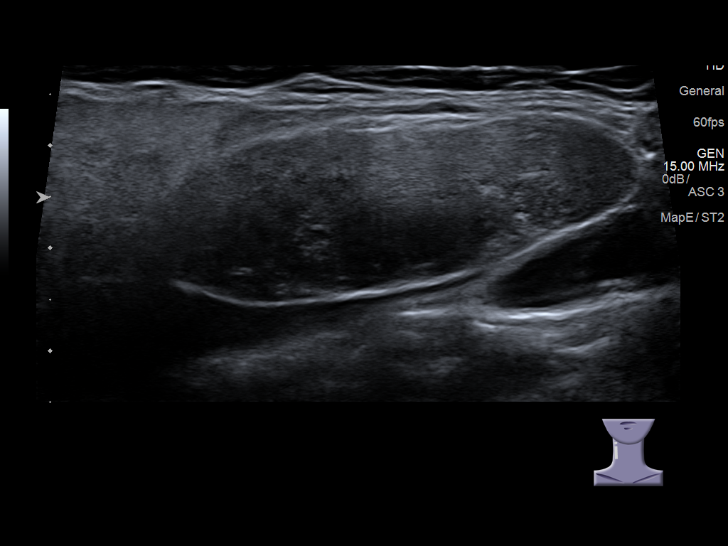
[im 12/21]
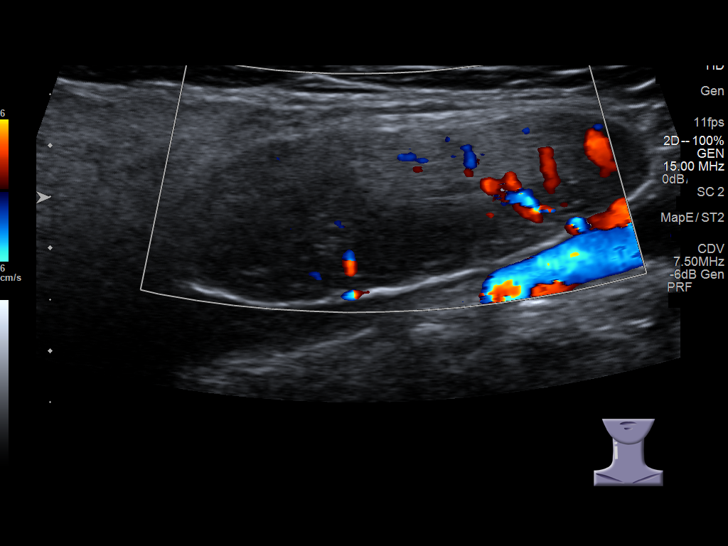
[im 13/21]
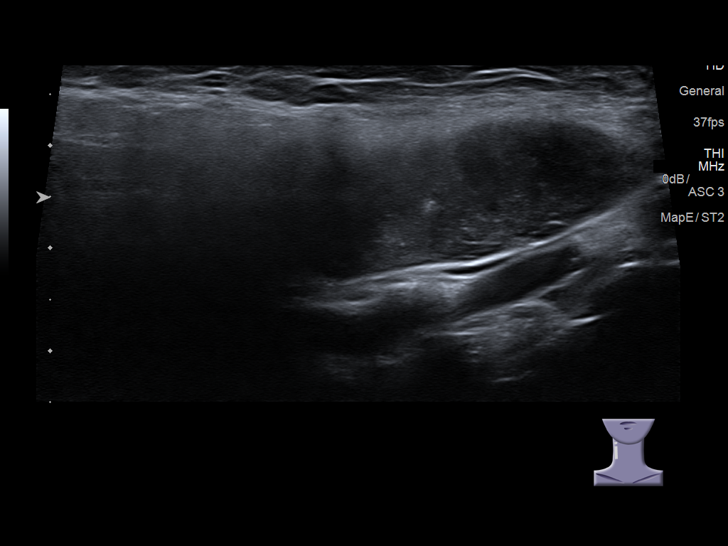
[im 15/21]
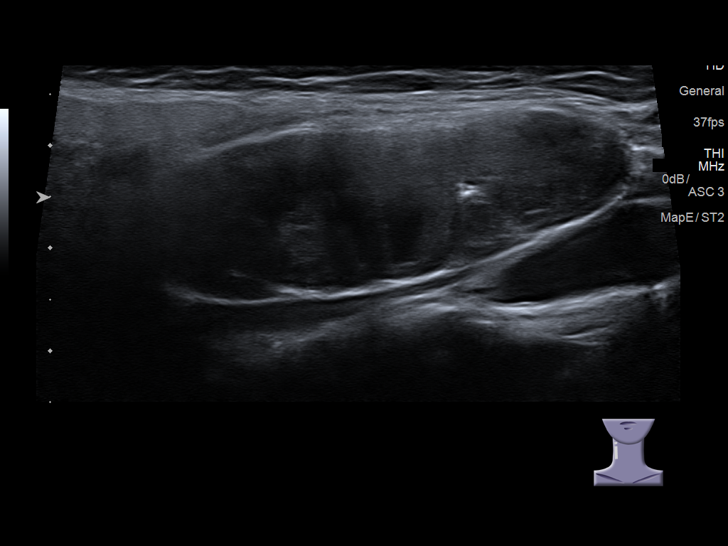
[im 16/21]
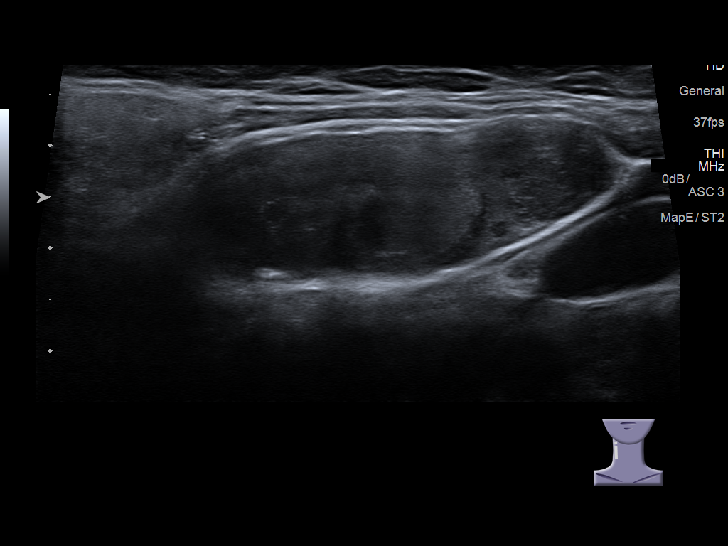
[im 18/21]
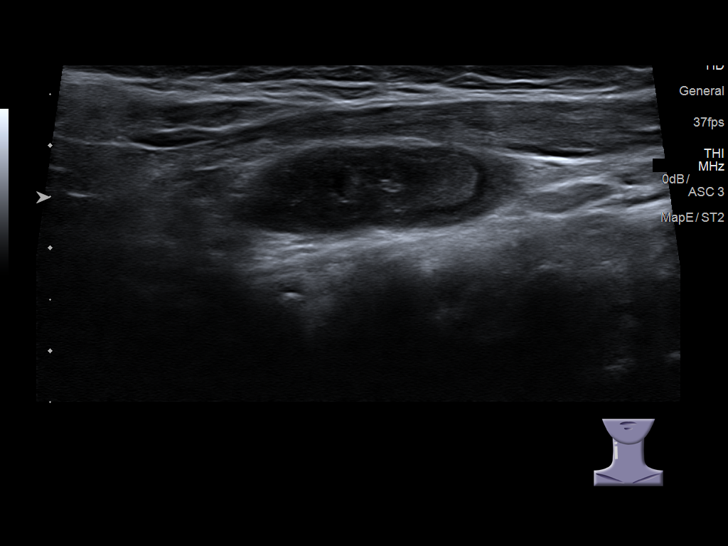
[im 19/21]
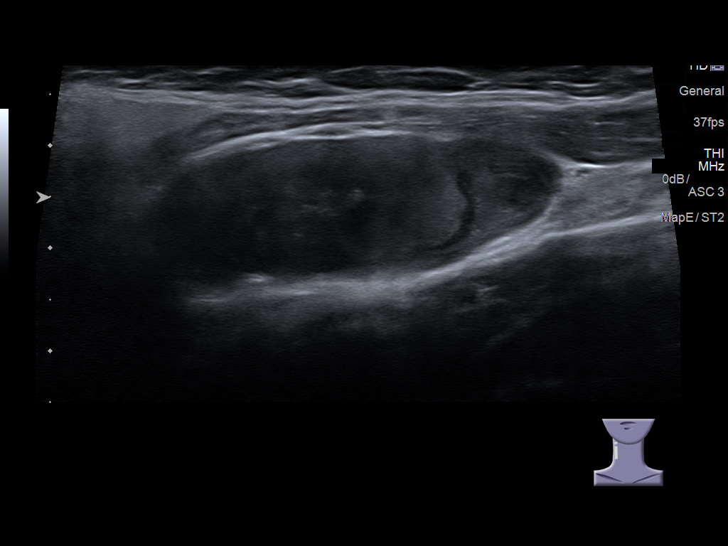
[im 21/21]
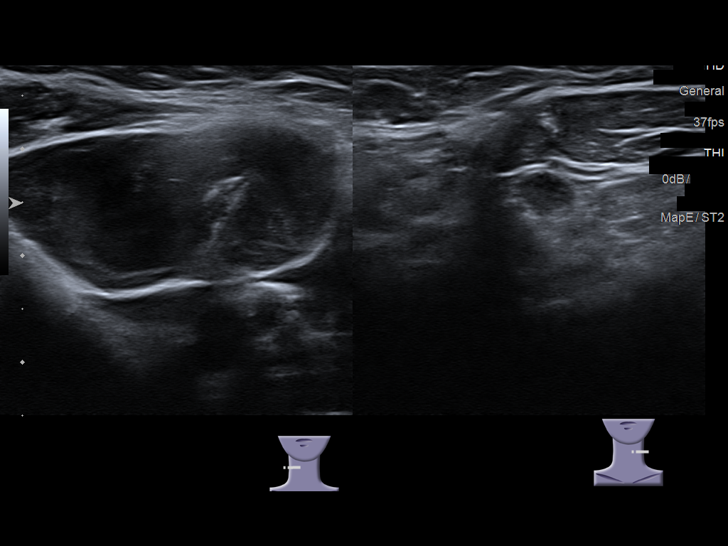

[14 of 21 positions shown; findings below may reference images not displayed]

FINDINGS: Patient's palpable area of concern correlates with an enlarged
right-sided presumed cervical lymph node which measures
approximately 1.7 cm in greatest short axis diameter with associated
obliteration of the fatty hilum (image 7).
IMPRESSION: Patient's palpable area of concern appears to correlate with a
pathologically enlarged right cervical lymph node - further
evaluation contrast-enhanced neck CT could be performed as
indicated.

## 2017-12-30 ENCOUNTER — Other Ambulatory Visit (INDEPENDENT_AMBULATORY_CARE_PROVIDER_SITE_OTHER): Payer: Self-pay

## 2017-12-30 DIAGNOSIS — I82492 Acute embolism and thrombosis of other specified deep vein of left lower extremity: Secondary | ICD-10-CM

## 2018-01-03 DIAGNOSIS — C4402 Squamous cell carcinoma of skin of lip: Secondary | ICD-10-CM | POA: Diagnosis not present

## 2018-01-03 DIAGNOSIS — D3701 Neoplasm of uncertain behavior of lip: Secondary | ICD-10-CM | POA: Diagnosis not present

## 2018-01-04 ENCOUNTER — Other Ambulatory Visit: Payer: Self-pay

## 2018-01-04 ENCOUNTER — Ambulatory Visit (INDEPENDENT_AMBULATORY_CARE_PROVIDER_SITE_OTHER): Payer: BLUE CROSS/BLUE SHIELD | Admitting: Cardiothoracic Surgery

## 2018-01-04 ENCOUNTER — Other Ambulatory Visit: Payer: Self-pay | Admitting: Oncology

## 2018-01-04 ENCOUNTER — Encounter: Payer: Self-pay | Admitting: Cardiothoracic Surgery

## 2018-01-04 VITALS — BP 110/60 | HR 76 | Resp 18 | Ht 63.0 in | Wt 143.0 lb

## 2018-01-04 DIAGNOSIS — I712 Thoracic aortic aneurysm, without rupture, unspecified: Secondary | ICD-10-CM

## 2018-01-04 NOTE — Progress Notes (Signed)
PCP is Valerie Roys, DO Referring Provider is Minna Merritts, MD  Chief Complaint  Wendy Kelly presents with  . Thoracic Aortic Aneurysm    1 year f/u, PET Scan 11/30/17 noted  Ascending aortic aneurysm 4.3 cm in diameter    HPI: One year follow-up with CT scan of chest taken during PET scan.  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  It has remained  asymptomatic.  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  PET scan earlier this year showed no evidence of residual cancer in the ascending aorta shows no significant change from the moderate degree of dilatation.  The Wendy Kelly's blood pressure has been low because of weight loss. The Wendy Kelly is taking 6 months of Xarelto for DVT associated with the head and neck malignancy.  No bleeding complications noted.  Past Medical History:  Diagnosis Date  . AA (aortic aneurysm) ([place])    a. 04/2011 Echo: Ao Root: 4.1cm, Asc Ao 4.7cm.  . Anxiety   . AV block, 2nd degree    a. 05/2005 - s/p MDT Adapta ADDR01 Dual Chamber PPM  . Bicuspid aortic valve   . Cancer ([place])   . Chest pain    a. 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  . Depression   . Expressive aphasia    a. ongoing since 04/2011 - seen by neurology - ? TIA vs. Migraine  . Facial numbness    a. ongoing since 04/2011 - seen by neurology - ? TIA vs. Migraine  . GERD (gastroesophageal reflux disease)   . Low blood pressure   . Presence of permanent cardiac pacemaker   . 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 Syncope    a. 04/2011 Echo: EF 55-65%, No RWMA, Gr 1 DD.    Past Surgical History:  Procedure Laterality Date  . ABDOMINAL WALL MESH  REMOVAL    . bladder tack    . cataract Bilateral 2004  . CHOLECYSTECTOMY  2000  . DILATION AND CURETTAGE OF UTERUS    . DIRECT LARYNGOSCOPY Right 06/16/2017   Procedure: MICRO DIRECT LARYNGOSCOPY WITH BIOPSY  OF RIGHT BASE OF TONGUE;  Surgeon: Carloyn Manner, MD;  Location: [facility];  Service: ENT;  Laterality: Right;  . EP IMPLANTABLE DEVICE N/A 06/04/2015   Procedure: PPM Generator Changeout;  Surgeon: Deboraha Sprang, MD;  Location: [facility];  Service: Cardiovascular;  Laterality: N/A;  . ESOPHAGOGASTRODUODENOSCOPY (EGD) WITH PROPOFOL N/A 12/18/2015   Procedure: ESOPHAGOGASTRODUODENOSCOPY (EGD) WITH gastric biopsy and dilation;  Surgeon: Lucilla Lame, MD;  Location: [place];  Service: Endoscopy;  Laterality: N/A;  . EYE SURGERY    . INSERT / REPLACE / REMOVE PACEMAKER    . IR FLUORO GUIDE PORT INSERTION RIGHT  06/22/2017  . PACEMAKER INSERTION  2006   Medtronic Adapta ADDR01  . TONSILLECTOMY    . TONSILLECTOMY    . VAGINAL HYSTERECTOMY  2013   mad/cope    Family History  Problem Relation Age of Onset  . COPD Mother        alive @ 70  . Atrial fibrillation Mother   . Stroke Father        died @ 16  . Breast cancer Maternal Aunt 70  . COPD Maternal Grandmother   . Ovarian cancer Neg Hx   . Diabetes Neg Hx     Social History  Social History   Tobacco Use  . Smoking status: Never Smoker  . Smokeless tobacco: Never Used  . Tobacco comment: tobacco use -no  Substance Use Topics  . Alcohol use: No  . Drug use: No    Current Outpatient Medications  Medication Sig Dispense Refill  . aspirin 81 MG tablet Take 81 mg by mouth daily.    Marland Kitchen b complex vitamins tablet Take 1 tablet by mouth daily.    Marland Kitchen BLACK ELDERBERRY,BERRY-FLOWER, PO Take 1 capsule by mouth daily.    Marland Kitchen CRANBERRY SOFT PO Take 2 each by mouth daily.     . Digestive Enzymes (DIGESTIVE ENZYME PO) Take 1-2 capsules by mouth daily.    . nitroGLYCERIN (NITROSTAT) 0.4 MG SL tablet Place 1 tablet (0.4 mg total) under the tongue every 5 (five) minutes as needed for chest pain. 25 tablet 3  . NON FORMULARY Take by mouth daily. Bone Up (calcium)     . PRESCRIPTION MEDICATION Place 1 tablet vaginally 3 (three)  times a week. E3 1mg  and Testosterone 1mg  compound    . rivaroxaban (XARELTO) 20 MG TABS tablet Take 1 tablet (20 mg total) by mouth daily with supper. 30 tablet 2   No current facility-administered medications for this visit.    Facility-Administered Medications Ordered in Other Visits  Medication Dose Route Frequency Provider Last Rate Last Dose  . 0.9 %  sodium chloride infusion   Intravenous Once Sindy Guadeloupe, MD        Allergies  Allergen Reactions  . Cefdinir Swelling    Swelling in mouth   . Cephalosporins Swelling  . Dairy Aid [Lactase] Swelling    Milk and milk products cause swelling and tingling of tongue  . Flagyl [Metronidazole] Swelling    Mouth and throat and ears go red and burn  . Lactose Intolerance (Gi) Other (See Comments)    GI upset.  Eats only gluten free  . Penicillins Rash    Rash/hives  . Phenylephrine-Guaifenesin Anaphylaxis    Night terrors  . Sulfa Antibiotics Rash and Hives    Other reaction(s): Unknown   . Sulfonamide Derivatives Rash    Rash/itching.  From head to her toes, the itching was horrible. Penicillin is the worst  . Tape Rash    Adhesives all cause a problem severely.  It doesn't matter if it is paper tape or not.  Marland Kitchen Dexilant [Dexlansoprazole]     Pressure in head like head is in a vice and being squeezed  . Entex Lq [Phenylephrine-Guaifenesin]     Night terrors  . Other Other (See Comments)    Flu vaccine:  Weakness, dizziness, tia type symptoms.    . Oxycodone Nausea And Vomiting    And itching  . Succinylcholine Other (See Comments)    Trouble waking up  . Tetracycline Other (See Comments)    Unkown  . Codeine Nausea And Vomiting  . Guaifenesin & Derivatives Other (See Comments)    Night terrors  . Lactobacillus Itching  . Naproxen Sodium     Itching/rash  . Pseudoephedrine Rash  . Tetracyclines & Related Other (See Comments)    Was taking flagyl at the same time; unsure which caused the swelling  Swelling of  throat. Pharmacist states that he believes it was the flagyl and not the tetracycline  . Wheat Bran Other (See Comments)    Upset stomach. Now eats gluten free    Review of Systems  Recovering from chemoradiation for head neck cancer.  Last PET scan shows no evidence of residual tumor.  Weight loss 10-20 pounds, now improving.  Wendy Kelly's energy improving after chemoradiation.  No syncope palpitations edema.  No active dental complaints or shortness of breath.  She complains of a dry mouth and improved swallowing function.  BP 110/60 (BP Location: Left Arm, Wendy Kelly Position: Sitting, Cuff Size: Normal)   Pulse 76   Resp 18   Ht 5\' 3"  (1.6 m)   Wt 143 lb (64.9 kg)   SpO2 99% Comment: RA  BMI 25.33 kg/m  Physical Exam      Exam    General- alert and comfortable    Neck- no JVD, no cervical adenopathy palpable, no carotid bruit   Lungs- clear without rales, wheezes   Cor- regular rate and rhythm, no murmur , gallop   Abdomen- soft, non-tender   Extremities - warm, non-tender, minimal edema   Neuro- oriented, appropriate, no focal weakness   Diagnostic Tests: CT images taken during PET scan personally reviewed and discussed with Wendy Kelly.  The ascending fusiform aneurysm has not appeared to increase in size.  Impression: Stable asymptomatic fusiform ascending thoracic aortic aneurysm approximately 4.7 cm.  Blood pressure well controlled.  Risk of dissection in the current situation is low, less than 2%.  Plan: Return for surveillance CT scan of chest in 1 year.  Importance of blood pressure monitoring and control discussed with Wendy Kelly.   Len Childs, MD [facility] [phone]

## 2018-01-04 NOTE — Progress Notes (Signed)
PCP is Valerie Roys, DO Referring Provider is Minna Merritts, MD  Chief Complaint  Patient presents with  . Thoracic Aortic Aneurysm    1 year f/u, PET Scan 11/30/17 noted  Ascending aortic aneurysm 4.3 cm in diameter    HPI:   Past Medical History:  Diagnosis Date  . AA (aortic aneurysm) ([place])    a. 04/2011 Echo: Ao Root: 4.1cm, Asc Ao 4.7cm.  . Anxiety   . AV block, 2nd degree    a. 05/2005 - s/p MDT Adapta ADDR01 Dual Chamber PPM  . Bicuspid aortic valve   . Cancer ([place])   . Chest pain    a. 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  . Depression   . Expressive aphasia    a. ongoing since 04/2011 - seen by neurology - ? TIA vs. Migraine  . Facial numbness    a. ongoing since 04/2011 - seen by neurology - ? TIA vs. Migraine  . GERD (gastroesophageal reflux disease)   . Low blood pressure   . Presence of permanent cardiac pacemaker   . 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 Syncope    a. 04/2011 Echo: EF 55-65%, No RWMA, Gr 1 DD.    Past Surgical History:  Procedure Laterality Date  . ABDOMINAL WALL MESH  REMOVAL    . bladder tack    . cataract Bilateral 2004  . CHOLECYSTECTOMY  2000  . DILATION AND CURETTAGE OF UTERUS    . DIRECT LARYNGOSCOPY Right 06/16/2017   Procedure: MICRO DIRECT LARYNGOSCOPY WITH BIOPSY OF RIGHT BASE OF TONGUE;  Surgeon: Carloyn Manner, MD;  Location: [facility];  Service: ENT;  Laterality: Right;  . EP IMPLANTABLE DEVICE N/A 06/04/2015   Procedure: PPM Generator Changeout;  Surgeon: Deboraha Sprang, MD;  Location: [place] [facility];  Service: Cardiovascular;  Laterality: N/A;  . ESOPHAGOGASTRODUODENOSCOPY (EGD) WITH PROPOFOL N/A 12/18/2015   Procedure: ESOPHAGOGASTRODUODENOSCOPY (EGD) WITH gastric biopsy and dilation;  Surgeon: Lucilla Lame, MD;  Location: [place];  Service: Endoscopy;  Laterality: N/A;  . EYE SURGERY    . INSERT / REPLACE / REMOVE PACEMAKER    . IR FLUORO GUIDE PORT INSERTION RIGHT  06/22/2017  .  PACEMAKER INSERTION  2006   Medtronic Adapta ADDR01  . TONSILLECTOMY    . TONSILLECTOMY    . VAGINAL HYSTERECTOMY  2013   mad/cope    Family History  Problem Relation Age of Onset  . COPD Mother        alive @ 32  . Atrial fibrillation Mother   . Stroke Father        died @ 64  . Breast cancer Maternal Aunt 70  . COPD Maternal Grandmother   . Ovarian cancer Neg Hx   . Diabetes Neg Hx     Social History Social History   Tobacco Use  . Smoking status: Never Smoker  . Smokeless tobacco: Never Used  . Tobacco comment: tobacco use -no  Substance Use Topics  . Alcohol use: No  . Drug use: No    Current Outpatient Medications  Medication Sig Dispense Refill  . aspirin 81 MG tablet Take 81 mg by mouth daily.    Marland Kitchen b complex vitamins tablet Take 1 tablet by mouth daily.    Marland Kitchen BLACK ELDERBERRY,BERRY-FLOWER, PO Take 1 capsule by mouth daily.    Marland Kitchen CRANBERRY SOFT PO Take 2 each by mouth daily.     . Digestive Enzymes (DIGESTIVE ENZYME  PO) Take 1-2 capsules by mouth daily.    . nitroGLYCERIN (NITROSTAT) 0.4 MG SL tablet Place 1 tablet (0.4 mg total) under the tongue every 5 (five) minutes as needed for chest pain. 25 tablet 3  . NON FORMULARY Take by mouth daily. Bone Up (calcium)     . PRESCRIPTION MEDICATION Place 1 tablet vaginally 3 (three) times a week. E3 1mg  and Testosterone 1mg  compound    . rivaroxaban (XARELTO) 20 MG TABS tablet Take 1 tablet (20 mg total) by mouth daily with supper. 30 tablet 2   No current facility-administered medications for this visit.    Facility-Administered Medications Ordered in Other Visits  Medication Dose Route Frequency Provider Last Rate Last Dose  . 0.9 %  sodium chloride infusion   Intravenous Once Sindy Guadeloupe, MD        Allergies  Allergen Reactions  . Cefdinir Swelling    Swelling in mouth   . Cephalosporins Swelling  . Dairy Aid [Lactase] Swelling    Milk and milk products cause swelling and tingling of tongue  . Flagyl  [Metronidazole] Swelling    Mouth and throat and ears go red and burn  . Lactose Intolerance (Gi) Other (See Comments)    GI upset.  Eats only gluten free  . Penicillins Rash    Rash/hives  . Phenylephrine-Guaifenesin Anaphylaxis    Night terrors  . Sulfa Antibiotics Rash and Hives    Other reaction(s): Unknown   . Sulfonamide Derivatives Rash    Rash/itching.  From head to her toes, the itching was horrible. Penicillin is the worst  . Tape Rash    Adhesives all cause a problem severely.  It doesn't matter if it is paper tape or not.  Marland Kitchen Dexilant [Dexlansoprazole]     Pressure in head like head is in a vice and being squeezed  . Entex Lq [Phenylephrine-Guaifenesin]     Night terrors  . Other Other (See Comments)    Flu vaccine:  Weakness, dizziness, tia type symptoms.    . Oxycodone Nausea And Vomiting    And itching  . Succinylcholine Other (See Comments)    Trouble waking up  . Tetracycline Other (See Comments)    Unkown  . Codeine Nausea And Vomiting  . Guaifenesin & Derivatives Other (See Comments)    Night terrors  . Lactobacillus Itching  . Naproxen Sodium     Itching/rash  . Pseudoephedrine Rash  . Tetracyclines & Related Other (See Comments)    Was taking flagyl at the same time; unsure which caused the swelling  Swelling of throat. Pharmacist states that he believes it was the flagyl and not the tetracycline  . Wheat Bran Other (See Comments)    Upset stomach. Now eats gluten free    Review of Systems  BP 110/60 (BP Location: Left Arm, Patient Position: Sitting, Cuff Size: Normal)   Pulse 76   Resp 18   Ht 5\' 3"  (1.6 m)   Wt 143 lb (64.9 kg)   SpO2 99% Comment: RA  BMI 25.33 kg/m  Physical Exam   Diagnostic Tests:   Impression:   Plan:   Len Childs, MD [facility] [phone]

## 2018-01-05 ENCOUNTER — Ambulatory Visit: Payer: BLUE CROSS/BLUE SHIELD

## 2018-01-10 ENCOUNTER — Ambulatory Visit
Admission: RE | Admit: 2018-01-10 | Discharge: 2018-01-10 | Disposition: A | Discharge status: Home / Self Care | Payer: BLUE CROSS/BLUE SHIELD | Source: Ambulatory Visit | Attending: Vascular Surgery | Admitting: Vascular Surgery

## 2018-01-10 ENCOUNTER — Telehealth: Payer: Self-pay | Admitting: *Deleted

## 2018-01-10 DIAGNOSIS — I82512 Chronic embolism and thrombosis of left femoral vein: Secondary | ICD-10-CM | POA: Insufficient documentation

## 2018-01-10 DIAGNOSIS — I82492 Acute embolism and thrombosis of other specified deep vein of left lower extremity: Secondary | ICD-10-CM | POA: Diagnosis present

## 2018-01-10 DIAGNOSIS — I82402 Acute embolism and thrombosis of unspecified deep veins of left lower extremity: Secondary | ICD-10-CM | POA: Diagnosis not present

## 2018-01-10 NOTE — Telephone Encounter (Signed)
Patient called reporting that she went to dermatologist who biopsied her lip and it came back Squamous cell carcinoma. They want to refer her to [facility] for [place] procedure. She is asking to speak with Judeen Hammans for guidance since she is on Xarelto. Since Judeen Hammans is on vacation, Dr Janese Banks can you please advise?

## 2018-01-10 NOTE — Telephone Encounter (Signed)
Ideally she needs uninterrupted anticoagulation for 6 months which would be until September. However, if the procedure is going to be before that, she needs to hold her xarelto 2 days prior to procedure and take it right after the procedure as usual

## 2018-01-10 NOTE — Telephone Encounter (Signed)
Call returned to patient and left voice mail message with instructions from doctor

## 2018-01-11 ENCOUNTER — Ambulatory Visit (INDEPENDENT_AMBULATORY_CARE_PROVIDER_SITE_OTHER): Payer: BLUE CROSS/BLUE SHIELD | Admitting: Vascular Surgery

## 2018-01-11 ENCOUNTER — Inpatient Hospital Stay: Payer: BLUE CROSS/BLUE SHIELD | Attending: Oncology

## 2018-01-11 ENCOUNTER — Telehealth: Payer: Self-pay | Admitting: *Deleted

## 2018-01-11 ENCOUNTER — Inpatient Hospital Stay: Payer: BLUE CROSS/BLUE SHIELD

## 2018-01-11 ENCOUNTER — Encounter (INDEPENDENT_AMBULATORY_CARE_PROVIDER_SITE_OTHER): Payer: Self-pay | Admitting: Vascular Surgery

## 2018-01-11 ENCOUNTER — Encounter (INDEPENDENT_AMBULATORY_CARE_PROVIDER_SITE_OTHER): Payer: BLUE CROSS/BLUE SHIELD

## 2018-01-11 VITALS — BP 101/63 | HR 74 | Resp 17 | Ht 64.0 in | Wt 143.4 lb

## 2018-01-11 DIAGNOSIS — Z8589 Personal history of malignant neoplasm of other organs and systems: Secondary | ICD-10-CM | POA: Insufficient documentation

## 2018-01-11 DIAGNOSIS — Z452 Encounter for adjustment and management of vascular access device: Secondary | ICD-10-CM | POA: Insufficient documentation

## 2018-01-11 DIAGNOSIS — I82592 Chronic embolism and thrombosis of other specified deep vein of left lower extremity: Secondary | ICD-10-CM

## 2018-01-11 DIAGNOSIS — C109 Malignant neoplasm of oropharynx, unspecified: Secondary | ICD-10-CM

## 2018-01-11 MED ORDER — SODIUM CHLORIDE 0.9% FLUSH
10.0000 mL | Freq: Once | INTRAVENOUS | Status: AC
  Administered 2018-01-11: 10 mL via INTRAVENOUS
  Filled 2018-01-11: qty 10

## 2018-01-11 MED ORDER — HEPARIN SOD (PORK) LOCK FLUSH 100 UNIT/ML IV SOLN
500.0000 [IU] | Freq: Once | INTRAVENOUS | Status: AC
  Administered 2018-01-11: 500 [IU] via INTRAVENOUS

## 2018-01-11 NOTE — Telephone Encounter (Signed)
Patient called and states her Wendy Kelly surgery is scheduled on 02/21/18 and she does not have to come off the Xarelto because they will be using cautery for the procedure. She wanted Dr Janese Banks to know that her Xarelto will not be interrupted

## 2018-01-11 NOTE — Progress Notes (Signed)
Survivorship Care Plan visit completed.  Treatment summary reviewed and given to patient.  ASCO answers booklet reviewed and given to patient.  CARE program and Cancer Transitions discussed with patient along with other resources cancer center offers to patients and caregivers.  Patient verbalized understanding.    

## 2018-01-11 NOTE — Progress Notes (Signed)
Subjective:    Patient ID: Wendy Kelly, female    DOB: [birth year], 62 y.o.   MRN: [id number] Chief Complaint  Patient presents with  . Follow-up    u/s results   The patient presents for 28-month left lower extremity DVT follow-up.  The patient presents today without complaint.  The patient notes an improvement in her left lower extremity swelling and discomfort.  The patient denies any shortness of breath or chest pain.  The patient underwent a left lower extremity DVT study conducted at [facility] radiology department which was notable for no evidence of acute DVT within the left lower extremity.  Mixed occlusive, though predominantly nonocclusive, wall thickening/chronic DVT within the left femoral vein extending to involving the left popliteal vein, improved compared to the study completed on March 2019.  Resolved DVT in the left tibial veins.  Resolved SVT in the left lesser saphenous vein.  The patient continues to take Xarelto on daily basis.  Patient denies any fever, nausea vomiting.  Review of Systems  Constitutional: Negative.   HENT: Negative.   Eyes: Negative.   Respiratory: Negative.   Cardiovascular:       DVT  Gastrointestinal: Negative.   Endocrine: Negative.   Genitourinary: Negative.   Musculoskeletal: Negative.   Skin: Negative.   Allergic/Immunologic: Negative.   Neurological: Negative.   Hematological: Negative.   Psychiatric/Behavioral: Negative.       Objective:   Physical Exam  Constitutional: She appears well-developed and well-nourished. No distress.  HENT:  Head: Normocephalic and atraumatic.  Right Ear: External ear normal.  Left Ear: External ear normal.  Eyes: Pupils are equal, round, and reactive to light. Conjunctivae and EOM are normal.  Neck: Normal range of motion.  Cardiovascular: Normal rate, regular rhythm, normal heart sounds and intact distal pulses.  Pulses:      Radial pulses are 2+ on the right side, and 2+  on the left side.       Dorsalis pedis pulses are 2+ on the right side, and 2+ on the left side.       Posterior tibial pulses are 2+ on the right side, and 2+ on the left side.  Left lower extremity: No pain with palpation.  No pain with dorsiflexion.  Skin is intact.  Minimal edema.  Pulmonary/Chest: Effort normal and breath sounds normal.  Neurological: She is alert.  Skin: Skin is warm and dry. She is not diaphoretic.  Psychiatric: She has a normal mood and affect. Her behavior is normal. Judgment and thought content normal.  Vitals reviewed.  BP 101/63 (BP Location: Right Arm)   Pulse 74   Resp 17   Ht 5\' 4"  (1.626 m)   Wt 143 lb 6.4 oz (65 kg)   BMI 24.61 kg/m   Past Medical History:  Diagnosis Date  . AA (aortic aneurysm) ([place])    a. 04/2011 Echo: Ao Root: 4.1cm, Asc Ao 4.7cm.  . Anxiety   . AV block, 2nd degree    a. 05/2005 - s/p MDT Adapta ADDR01 Dual Chamber PPM  . Bicuspid aortic valve   . Cancer ([place])   . Chest pain    a. 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  . Depression   . Expressive aphasia    a. ongoing since 04/2011 - seen by neurology - ? TIA vs. Migraine  . Facial numbness    a. ongoing since 04/2011 - seen by neurology - ? TIA vs. Migraine  . GERD (gastroesophageal reflux disease)   .  Low blood pressure   . Presence of permanent cardiac pacemaker   . 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 Syncope    a. 04/2011 Echo: EF 55-65%, No RWMA, Gr 1 DD.   Social History   Socioeconomic History  . Marital status: Married    Spouse name: Not on file  . Number of children: Not on file  . Years of education: Not on file  . Highest education level: Not on file  Occupational History  . Not on file  Social Needs  . Financial resource strain: Not on file  . Food insecurity:    Worry: Not on file    Inability: Not on file  . Transportation needs:    Medical: Not on file    Non-medical: Not on file  Tobacco Use  . Smoking status: Never  Smoker  . Smokeless tobacco: Never Used  . Tobacco comment: tobacco use -no  Substance and Sexual Activity  . Alcohol use: No  . Drug use: No  . Sexual activity: Yes    Birth control/protection: Surgical  Lifestyle  . Physical activity:    Days per week: Not on file    Minutes per session: Not on file  . Stress: Not on file  Relationships  . Social connections:    Talks on phone: Not on file    Gets together: Not on file    Attends religious service: Not on file    Active member of club or organization: Not on file    Attends meetings of clubs or organizations: Not on file    Relationship status: Not on file  . Intimate partner violence:    Fear of current or ex partner: Not on file    Emotionally abused: Not on file    Physically abused: Not on file    Forced sexual activity: Not on file  Other Topics Concern  . Not on file  Social History Narrative   Lives in [place] with husband.  Works out regularly.  Works as a Geophysicist/field seismologist - owns own business.    Past Surgical History:  Procedure Laterality Date  . ABDOMINAL WALL MESH  REMOVAL    . bladder tack    . cataract Bilateral 2004  . CHOLECYSTECTOMY  2000  . DILATION AND CURETTAGE OF UTERUS    . DIRECT LARYNGOSCOPY Right 06/16/2017   Procedure: MICRO DIRECT LARYNGOSCOPY WITH BIOPSY OF RIGHT BASE OF TONGUE;  Surgeon: Carloyn Manner, MD;  Location: [facility] ORS;  Service: ENT;  Laterality: Right;  . EP IMPLANTABLE DEVICE N/A 06/04/2015   Procedure: PPM Generator Changeout;  Surgeon: Deboraha Sprang, MD;  Location: [facility];  Service: Cardiovascular;  Laterality: N/A;  . ESOPHAGOGASTRODUODENOSCOPY (EGD) WITH PROPOFOL N/A 12/18/2015   Procedure: ESOPHAGOGASTRODUODENOSCOPY (EGD) WITH gastric biopsy and dilation;  Surgeon: Lucilla Lame, MD;  Location: [facility];  Service: Endoscopy;  Laterality: N/A;  . EYE SURGERY    . INSERT / REPLACE / REMOVE PACEMAKER    . IR FLUORO GUIDE PORT INSERTION RIGHT   06/22/2017  . PACEMAKER INSERTION  2006   [employer] Adapta ADDR01  . TONSILLECTOMY    . TONSILLECTOMY    . VAGINAL HYSTERECTOMY  2013   mad/cope   Family History  Problem Relation Age of Onset  . COPD Mother        alive @ 39  . Atrial fibrillation Mother   . Stroke Father  died @ 46  . Breast cancer Maternal Aunt 70  . COPD Maternal Grandmother   . Ovarian cancer Neg Hx   . Diabetes Neg Hx    Allergies  Allergen Reactions  . Cefdinir Swelling    Swelling in mouth   . Cephalosporins Swelling  . Dairy Aid [Lactase] Swelling    Milk and milk products cause swelling and tingling of tongue  . Flagyl [Metronidazole] Swelling    Mouth and throat and ears go red and burn  . Lactose Intolerance (Gi) Other (See Comments)    GI upset.  Eats only gluten free  . Penicillins Rash    Rash/hives  . Phenylephrine-Guaifenesin Anaphylaxis    Night terrors  . Sulfa Antibiotics Rash and Hives    Other reaction(s): Unknown   . Sulfonamide Derivatives Rash    Rash/itching.  From head to her toes, the itching was horrible. Penicillin is the worst  . Tape Rash    Adhesives all cause a problem severely.  It doesn't matter if it is paper tape or not.  Marland Kitchen Dexilant [Dexlansoprazole]     Pressure in head like head is in a vice and being squeezed  . Entex Lq [Phenylephrine-Guaifenesin]     Night terrors  . Other Other (See Comments)    Flu vaccine:  Weakness, dizziness, tia type symptoms.    . Oxycodone Nausea And Vomiting    And itching  . Succinylcholine Other (See Comments)    Trouble waking up  . Tetracycline Other (See Comments)    Unkown  . Codeine Nausea And Vomiting  . Guaifenesin & Derivatives Other (See Comments)    Night terrors  . Lactobacillus Itching  . Naproxen Sodium     Itching/rash  . Pseudoephedrine Rash  . Tetracyclines & Related Other (See Comments)    Was taking flagyl at the same time; unsure which caused the swelling  Swelling of throat. Pharmacist  states that he believes it was the flagyl and not the tetracycline  . Wheat Bran Other (See Comments)    Upset stomach. Now eats gluten free      Assessment & Plan:  The patient presents for 63-month left lower extremity DVT follow-up.  The patient presents today without complaint.  The patient notes an improvement in her left lower extremity swelling and discomfort.  The patient denies any shortness of breath or chest pain.  The patient underwent a left lower extremity DVT study conducted at [facility] radiology department which was notable for no evidence of acute DVT within the left lower extremity.  Mixed occlusive, though predominantly nonocclusive, wall thickening/chronic DVT within the left femoral vein extending to involving the left popliteal vein, improved compared to the study completed on March 2019.  Resolved DVT in the left tibial veins.  Resolved SVT in the left lesser saphenous vein.  The patient continues to take Xarelto on daily basis.  Patient denies any fever, nausea vomiting.  1. 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 There is resolvent in the tibial and small saphenous vein The patient should come back in 3 months and undergo another ultrasound of the left lower extremity to assess her DVT status The patient should continue to take Xarelto on a daily basis If the patient explains any shortness of breath or chest pain she should seek medical attention immediately The patient can wear medical grade one compression socks, elevate  her legs and her new resume her normal activity at this point.  - VAS [place] LOWER EXTREMITY VENOUS (DVT); Future  Current Outpatient Medications on File Prior to Visit  Medication Sig Dispense Refill  . aspirin 81 MG tablet Take 81 mg by mouth daily.    Marland Kitchen b complex vitamins tablet Take 1 tablet by mouth daily.    Marland Kitchen BLACK  ELDERBERRY,BERRY-FLOWER, PO Take 1 capsule by mouth daily.    Marland Kitchen CRANBERRY SOFT PO Take 2 each by mouth daily.     . Digestive Enzymes (DIGESTIVE ENZYME PO) Take 1-2 capsules by mouth daily.    . nitroGLYCERIN (NITROSTAT) 0.4 MG SL tablet Place 1 tablet (0.4 mg total) under the tongue every 5 (five) minutes as needed for chest pain. 25 tablet 3  . NON FORMULARY Take by mouth daily. Bone Up (calcium)     . PRESCRIPTION MEDICATION Place 1 tablet vaginally 3 (three) times a week. E3 1mg  and Testosterone 1mg  compound    . XARELTO 20 MG TABS tablet TAKE 1 TABLET(20 MG) BY MOUTH DAILY WITH SUPPER 30 tablet 0   Current Facility-Administered Medications on File Prior to Visit  Medication Dose Route Frequency Provider Last Rate Last Dose  . 0.9 %  sodium chloride infusion   Intravenous Once Sindy Guadeloupe, MD        There are no Patient Instructions on file for this visit. No follow-ups on file.   Seleena Reimers A Claris Guymon, PA-C

## 2018-01-17 NOTE — Progress Notes (Signed)
Nutrition  Patient contacted RD via phone requesting nutrition appointment.  Concerned about continued weight loss.  Scheduled for July 22 at 9am, patient aware.   [name] B. [name], [place], [place] Registered Dietitian [phone] (pager)

## 2018-01-19 LAB — CUP PACEART REMOTE DEVICE CHECK
Battery Impedance: 163 Ohm
Battery Remaining Longevity: 144 mo
Battery Voltage: 2.79 V
Brady Statistic AP VP Percent: 0 %
Brady Statistic AS VS Percent: 87 %
Date Time Interrogation Session: 20190604093633
Implantable Lead Implant Date: 20061128
Implantable Lead Location: 753859
Implantable Lead Model: 4092
Implantable Pulse Generator Implant Date: 20161123
Lead Channel Pacing Threshold Amplitude: 0.375 V
Lead Channel Pacing Threshold Amplitude: 1.125 V
Lead Channel Pacing Threshold Pulse Width: 0.4 ms
Lead Channel Setting Pacing Amplitude: 2 V
MDC IDC LEAD IMPLANT DT: 20061128
MDC IDC LEAD LOCATION: 753860
MDC IDC MSMT LEADCHNL RA IMPEDANCE VALUE: 593 Ohm
MDC IDC MSMT LEADCHNL RA PACING THRESHOLD PULSEWIDTH: 0.4 ms
MDC IDC MSMT LEADCHNL RV IMPEDANCE VALUE: 651 Ohm
MDC IDC SET LEADCHNL RV PACING AMPLITUDE: 2.5 V
MDC IDC SET LEADCHNL RV PACING PULSEWIDTH: 0.4 ms
MDC IDC SET LEADCHNL RV SENSING SENSITIVITY: 2.8 mV
MDC IDC STAT BRADY AP VS PERCENT: 13 %
MDC IDC STAT BRADY AS VP PERCENT: 0 %

## 2018-01-30 ENCOUNTER — Inpatient Hospital Stay: Payer: BLUE CROSS/BLUE SHIELD

## 2018-01-30 NOTE — Progress Notes (Signed)
Nutrition Follow-up:  Patient s/p chemotherapy and radiation therapy for base of the tongue cancer.    Patient wanted to have nutrition visit via phone today vs in person as daughter has started with contractions this am and will be taking her to the doctor later today.  Patient reports that she is still struggling with taste change and dry mouth. Reports blood pressure is down and MD wants her to gain about 5 pounds.  Reports that she is able to eat salads, roast beef, potato salad, big breakfast eater.  Has been eating more sodium lately due to trying to keep her blood pressure up.  Concerned about eating food that turn to glucose (rice, potatoes, corn).    Medications: reviewed  Labs: reviewed  Anthropometrics:   Per home scales weight 139-141 lb decreased since 156 lb on 3/14 after treatment  Does not want to gain all of weight back but wants to gain about 5 pounds   Estimated Energy Needs  Kcals: 1625-1950 calories Protein: 81-98 g/d Fluid: 1.9 L/d  NUTRITION DIAGNOSIS: predicted suboptimal energy intake continues due to side effects from treatment   INTERVENTION:  DIscussed calorie goal for weight gain.  Will send sample meal plans via email.   Discussed research regarding sugar and cancer.  Will email handout. Encouraged adding good heart healthy fats to diet for adding calories.   Patient still has [employer] on hand and will supplement diet.  Encouraged patient to keep food diary and record number of calories taken in each day for weight gain.  Provided patient with reliable information regarding cancer recurrence and nutrition research.      MONITORING, EVALUATION, GOAL: weight trends, intake   NEXT VISIT: as needed  Wendy Kelly, [place], [place] Registered Dietitian [phone] (pager)

## 2018-02-06 DIAGNOSIS — R682 Dry mouth, unspecified: Secondary | ICD-10-CM | POA: Diagnosis not present

## 2018-02-06 DIAGNOSIS — Z85818 Personal history of malignant neoplasm of other sites of lip, oral cavity, and pharynx: Secondary | ICD-10-CM | POA: Diagnosis not present

## 2018-02-06 DIAGNOSIS — J300 Vasomotor rhinitis: Secondary | ICD-10-CM | POA: Diagnosis not present

## 2018-02-07 ENCOUNTER — Other Ambulatory Visit: Payer: Self-pay | Admitting: Oncology

## 2018-02-08 ENCOUNTER — Ambulatory Visit
Admission: RE | Admit: 2018-02-08 | Discharge: 2018-02-08 | Disposition: A | Discharge status: Home / Self Care | Payer: BLUE CROSS/BLUE SHIELD | Source: Ambulatory Visit | Attending: Radiation Oncology | Admitting: Radiation Oncology

## 2018-02-08 ENCOUNTER — Other Ambulatory Visit: Payer: Self-pay

## 2018-02-08 ENCOUNTER — Encounter: Payer: Self-pay | Admitting: Radiation Oncology

## 2018-02-08 VITALS — BP 108/73 | HR 69 | Temp 97.0°F | Resp 18 | Wt 143.1 lb

## 2018-02-08 DIAGNOSIS — Z85810 Personal history of malignant neoplasm of tongue: Secondary | ICD-10-CM | POA: Diagnosis not present

## 2018-02-08 DIAGNOSIS — Z9221 Personal history of antineoplastic chemotherapy: Secondary | ICD-10-CM | POA: Insufficient documentation

## 2018-02-08 DIAGNOSIS — Z923 Personal history of irradiation: Secondary | ICD-10-CM | POA: Insufficient documentation

## 2018-02-08 DIAGNOSIS — C760 Malignant neoplasm of head, face and neck: Secondary | ICD-10-CM

## 2018-02-08 NOTE — Progress Notes (Signed)
Radiation Oncology Follow up Note  Name: Wendy Kelly   Date:   02/08/2018 MRN:  [id number] DOB: October 10, 1955    This 62 y.o. 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 Seen today in routine follow-up she is doing well. She's having no significant dysphagia or head and neck pain. She still has altered taste and a dry mouth..she had a PET CT scan back in May showing complete resolution of previous hypermetabolic level II adenopathy.She is otherwise doing well.  COMPLICATIONS OF TREATMENT: none  FOLLOW UP COMPLIANCE: keeps appointments   PHYSICAL EXAM:  BP 108/73   Pulse 69   Temp (!) 97 F (36.1 C)   Resp 18   Wt 143 lb 1.3 oz (64.9 kg)   BMI 24.56 kg/m  Oral cavity is clear no oral mucosal lesions are identified. Indirect mirror examination shows upper airway clear vallecula and base of tongue within normal limits. Neck is clear without evidence of subject gastric cervical or supraclavicular adenopathy.Well-developed well-nourished patient in NAD. HEENT reveals PERLA, EOMI, discs not visualized.  Oral cavity is clear. No oral mucosal lesions are identified. Neck is clear without evidence of cervical or supraclavicular adenopathy. Lungs are clear to A&P. Cardiac examination is essentially unremarkable with regular rate and rhythm without murmur rub or thrill. Abdomen is benign with no organomegaly or masses noted. Motor sensory and DTR levels are equal and symmetric in the upper and lower extremities. Cranial nerves II through XII are grossly intact. Proprioception is intact. No peripheral adenopathy or edema is identified. No motor or sensory  levels are noted. Crude visual fields are within normal range.  RADIOLOGY RESULTS: PET CT scan is reviewed and compatible with the above-stated findings  PLAN: present time she is doing well with no evidence of disease. I'm please with her overall progress. I've asked to see her back in 6 months for follow-up. She continues close follow-up care with ENT. Patient is to call with any concerns at any time.  I would like to take this opportunity to thank you for allowing me to participate in the care of your patient.Noreene Filbert, MD

## 2018-02-09 HISTORY — PX: MOHS SURGERY: SUR867

## 2018-02-14 DIAGNOSIS — M79675 Pain in left toe(s): Secondary | ICD-10-CM | POA: Diagnosis not present

## 2018-02-14 DIAGNOSIS — M79674 Pain in right toe(s): Secondary | ICD-10-CM | POA: Diagnosis not present

## 2018-02-14 DIAGNOSIS — L600 Ingrowing nail: Secondary | ICD-10-CM | POA: Diagnosis not present

## 2018-02-21 DIAGNOSIS — L578 Other skin changes due to chronic exposure to nonionizing radiation: Secondary | ICD-10-CM | POA: Diagnosis not present

## 2018-02-21 DIAGNOSIS — L988 Other specified disorders of the skin and subcutaneous tissue: Secondary | ICD-10-CM | POA: Diagnosis not present

## 2018-02-21 DIAGNOSIS — L814 Other melanin hyperpigmentation: Secondary | ICD-10-CM | POA: Diagnosis not present

## 2018-02-21 DIAGNOSIS — C4402 Squamous cell carcinoma of skin of lip: Secondary | ICD-10-CM | POA: Diagnosis not present

## 2018-03-08 ENCOUNTER — Other Ambulatory Visit: Payer: Self-pay | Admitting: *Deleted

## 2018-03-08 ENCOUNTER — Inpatient Hospital Stay: Payer: BLUE CROSS/BLUE SHIELD | Attending: Oncology

## 2018-03-08 DIAGNOSIS — C109 Malignant neoplasm of oropharynx, unspecified: Secondary | ICD-10-CM | POA: Diagnosis not present

## 2018-03-08 DIAGNOSIS — Z452 Encounter for adjustment and management of vascular access device: Secondary | ICD-10-CM | POA: Diagnosis not present

## 2018-03-08 MED ORDER — RIVAROXABAN 20 MG PO TABS: ORAL_TABLET | ORAL | 1 refills | Status: DC

## 2018-03-08 MED ORDER — SODIUM CHLORIDE 0.9% FLUSH
10.0000 mL | Freq: Once | INTRAVENOUS | Status: AC
  Administered 2018-03-08: 10 mL via INTRAVENOUS
  Filled 2018-03-08: qty 10

## 2018-03-08 MED ORDER — HEPARIN SOD (PORK) LOCK FLUSH 100 UNIT/ML IV SOLN
500.0000 [IU] | Freq: Once | INTRAVENOUS | Status: AC
  Administered 2018-03-08: 500 [IU] via INTRAVENOUS

## 2018-03-14 ENCOUNTER — Ambulatory Visit (INDEPENDENT_AMBULATORY_CARE_PROVIDER_SITE_OTHER): Payer: BLUE CROSS/BLUE SHIELD | Admitting: *Deleted

## 2018-03-14 DIAGNOSIS — L570 Actinic keratosis: Secondary | ICD-10-CM | POA: Diagnosis not present

## 2018-03-14 DIAGNOSIS — Z85828 Personal history of other malignant neoplasm of skin: Secondary | ICD-10-CM | POA: Diagnosis not present

## 2018-03-14 DIAGNOSIS — R55 Syncope and collapse: Secondary | ICD-10-CM

## 2018-03-14 DIAGNOSIS — D225 Melanocytic nevi of trunk: Secondary | ICD-10-CM | POA: Diagnosis not present

## 2018-03-14 DIAGNOSIS — I441 Atrioventricular block, second degree: Secondary | ICD-10-CM

## 2018-03-14 DIAGNOSIS — D2272 Melanocytic nevi of left lower limb, including hip: Secondary | ICD-10-CM | POA: Diagnosis not present

## 2018-03-14 DIAGNOSIS — D2262 Melanocytic nevi of left upper limb, including shoulder: Secondary | ICD-10-CM | POA: Diagnosis not present

## 2018-03-14 NOTE — Progress Notes (Signed)
Remote pacemaker transmission.   

## 2018-03-31 ENCOUNTER — Inpatient Hospital Stay: Payer: BLUE CROSS/BLUE SHIELD

## 2018-03-31 ENCOUNTER — Encounter: Payer: Self-pay | Admitting: Oncology

## 2018-03-31 ENCOUNTER — Inpatient Hospital Stay: Payer: BLUE CROSS/BLUE SHIELD | Attending: Oncology | Admitting: Oncology

## 2018-03-31 VITALS — BP 96/65 | HR 70 | Temp 97.8°F | Resp 18 | Ht 64.0 in | Wt 144.2 lb

## 2018-03-31 DIAGNOSIS — Z9221 Personal history of antineoplastic chemotherapy: Secondary | ICD-10-CM | POA: Diagnosis not present

## 2018-03-31 DIAGNOSIS — F418 Other specified anxiety disorders: Secondary | ICD-10-CM | POA: Insufficient documentation

## 2018-03-31 DIAGNOSIS — I714 Abdominal aortic aneurysm, without rupture: Secondary | ICD-10-CM | POA: Diagnosis not present

## 2018-03-31 DIAGNOSIS — I825Y2 Chronic embolism and thrombosis of unspecified deep veins of left proximal lower extremity: Secondary | ICD-10-CM

## 2018-03-31 DIAGNOSIS — Z85819 Personal history of malignant neoplasm of unspecified site of lip, oral cavity, and pharynx: Secondary | ICD-10-CM | POA: Diagnosis not present

## 2018-03-31 DIAGNOSIS — Z923 Personal history of irradiation: Secondary | ICD-10-CM | POA: Insufficient documentation

## 2018-03-31 DIAGNOSIS — R5383 Other fatigue: Secondary | ICD-10-CM

## 2018-03-31 DIAGNOSIS — Z803 Family history of malignant neoplasm of breast: Secondary | ICD-10-CM

## 2018-03-31 DIAGNOSIS — Z86718 Personal history of other venous thrombosis and embolism: Secondary | ICD-10-CM | POA: Diagnosis not present

## 2018-03-31 DIAGNOSIS — K219 Gastro-esophageal reflux disease without esophagitis: Secondary | ICD-10-CM | POA: Insufficient documentation

## 2018-03-31 DIAGNOSIS — D649 Anemia, unspecified: Secondary | ICD-10-CM | POA: Diagnosis not present

## 2018-03-31 DIAGNOSIS — Z7982 Long term (current) use of aspirin: Secondary | ICD-10-CM | POA: Insufficient documentation

## 2018-03-31 DIAGNOSIS — Z7901 Long term (current) use of anticoagulants: Secondary | ICD-10-CM | POA: Insufficient documentation

## 2018-03-31 DIAGNOSIS — C109 Malignant neoplasm of oropharynx, unspecified: Secondary | ICD-10-CM

## 2018-03-31 LAB — CBC WITH DIFFERENTIAL/PLATELET
Basophils Absolute: 0 10*3/uL (ref 0–0.1)
Basophils Relative: 1 %
EOS ABS: 0.2 10*3/uL (ref 0–0.7)
EOS PCT: 4 %
HCT: 36.2 % (ref 35.0–47.0)
Hemoglobin: 12.4 g/dL (ref 12.0–16.0)
LYMPHS PCT: 15 %
Lymphs Abs: 0.6 10*3/uL — ABNORMAL LOW (ref 1.0–3.6)
MCH: 31.8 pg (ref 26.0–34.0)
MCHC: 34.3 g/dL (ref 32.0–36.0)
MCV: 92.6 fL (ref 80.0–100.0)
MONO ABS: 0.4 10*3/uL (ref 0.2–0.9)
MONOS PCT: 9 %
NEUTROS ABS: 2.8 10*3/uL (ref 1.4–6.5)
Neutrophils Relative %: 71 %
PLATELETS: 142 10*3/uL — AB (ref 150–440)
RBC: 3.9 MIL/uL (ref 3.80–5.20)
RDW: 13 % (ref 11.5–14.5)
WBC: 3.9 10*3/uL (ref 3.6–11.0)

## 2018-03-31 NOTE — Progress Notes (Signed)
No new changes noted today 

## 2018-04-03 ENCOUNTER — Telehealth: Payer: Self-pay | Admitting: *Deleted

## 2018-04-03 NOTE — Telephone Encounter (Signed)
Called pt and got voicemail. Left amesage that she can stop xarelto as of today, Wendy Kelly got permission for the vascular md to say ok to stop. I will fill out form to get port out. It will be at least a week due to her stopping xarelto.  I will let her know when I get it worked out

## 2018-04-03 NOTE — Progress Notes (Signed)
Hematology/Oncology Consult note [facility]  Telephone:([phone] [phone]  Patient Care Team: Valerie Roys, DO as PCP - General (Family Medicine) Minna Merritts, MD as Consulting Physician (Cardiology) Sindy Guadeloupe, MD as Consulting Physician (Oncology) Carloyn Manner, MD as Referring Physician (Otolaryngology) Noreene Filbert, MD as Referring Physician (Radiation Oncology)   Name of the patient: Wendy Kelly  [id number]  [birth year]   Date of visit: 04/03/18  Diagnosis- 1. 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 LLE DVT  Chief complaint/ Reason for visit- routine f/u of head and neck cancer  Heme/Onc history: 1.Patient is a 62 year old female who was seen by Dr. Consuela Mimes evaluation of right neck mass. The mass has been present for or a year and has been growing gradually. It was associated with some right ear pain but no fever. Patient has been a never smoker.Ultrasound on 05/30/2017 showed:Single enlarged lymph node in the right neck measures approximately 4.8 x 1.7 x 3.0 cm  2.Patient underwent IR guided lymph node biopsy of the cervical lymph node which showed metastatic squamous cell carcinoma. Staining for P 16 was positive and sox 10 was negative   3.PET/CT scan on 06/09/2017 showed intensely hypermetabolic enlarged right level 2 cervical lymph node with an SUV of 11.1. No clear site of primary malignancy within the head and neck. Potential site of investigation would include right base of tongue. There was asymmetric metabolic activity noted in that area with an SUV of 4.5. Less suspicious region in the posterior glottis. No evidence of distant metastatic disease.  4.Patient works as a Geophysicist/field seismologist at [employer]. Patient has had multiple allergies to medications in the past. Reports that when she took steroids in the past it made her quite  aggravated. Her sister was hospitalized in a mental health facility after receiving a steroid course in the past.  5. Right tongue base biopsy positive for SCC.Baseline audiogram normal. Concurrent chemo/RT completed on 09/05/17.  6. LLE DVT on 09/27/17 that was diagnosed right after she finished chemotherapy. Currently on xarelto   Interval history-overall patient is doing well.  Her taste sensation is improved and her energy levels are better.  She does have some baseline fatigue.  Denies any tingling numbness in her extremities.  ECOG PS- 0 Pain scale- 0 Opioid associated constipation- no  Review of systems- Review of Systems  Constitutional: Positive for malaise/fatigue. Negative for chills, fever and weight loss.  HENT: Negative for congestion, ear discharge and nosebleeds.   Eyes: Negative for blurred vision.  Respiratory: Negative for cough, hemoptysis, sputum production, shortness of breath and wheezing.   Cardiovascular: Negative for chest pain, palpitations, orthopnea and claudication.  Gastrointestinal: Negative for abdominal pain, blood in stool, constipation, diarrhea, heartburn, melena, nausea and vomiting.  Genitourinary: Negative for dysuria, flank pain, frequency, hematuria and urgency.  Musculoskeletal: Negative for back pain, joint pain and myalgias.  Skin: Negative for rash.  Neurological: Negative for dizziness, tingling, focal weakness, seizures, weakness and headaches.  Endo/Heme/Allergies: Does not bruise/bleed easily.  Psychiatric/Behavioral: Negative for depression and suicidal ideas. The patient does not have insomnia.       Allergies  Allergen Reactions  . Cefdinir Swelling    Swelling in mouth   . Cephalosporins Swelling  . Dairy Aid [Lactase] Swelling    Milk and milk products cause swelling and tingling of tongue  . Flagyl [Metronidazole] Swelling    Mouth and throat and ears  go red and burn  . Lactose Intolerance (Gi) Other (See Comments)     GI upset.  Eats only gluten free  . Penicillins Rash    Rash/hives  . Phenylephrine-Guaifenesin Anaphylaxis    Night terrors  . Sulfa Antibiotics Rash and Hives    Other reaction(s): Unknown   . Sulfonamide Derivatives Rash    Rash/itching.  From head to her toes, the itching was horrible. Penicillin is the worst  . Tape Rash    Adhesives all cause a problem severely.  It doesn't matter if it is paper tape or not.  Marland Kitchen Dexilant [Dexlansoprazole]     Pressure in head like head is in a vice and being squeezed  . Entex Lq [Phenylephrine-Guaifenesin]     Night terrors  . Other Other (See Comments)    Flu vaccine:  Weakness, dizziness, tia type symptoms.    . Oxycodone Nausea And Vomiting    And itching  . Succinylcholine Other (See Comments)    Trouble waking up  . Tetracycline Other (See Comments)    Unkown  . Codeine Nausea And Vomiting  . Guaifenesin & Derivatives Other (See Comments)    Night terrors  . Lactobacillus Itching  . Naproxen Sodium     Itching/rash  . Pseudoephedrine Rash  . Tetracyclines & Related Other (See Comments)    Was taking flagyl at the same time; unsure which caused the swelling  Swelling of throat. Pharmacist states that he believes it was the flagyl and not the tetracycline  . Wheat Bran Other (See Comments)    Upset stomach. Now eats gluten free     Past Medical History:  Diagnosis Date  . AA (aortic aneurysm) ([place])    a. 04/2011 Echo: Ao Root: 4.1cm, Asc Ao 4.7cm.  . Anxiety   . AV block, 2nd degree    a. 05/2005 - s/p MDT Adapta ADDR01 Dual Chamber PPM  . Bicuspid aortic valve   . Cancer ([place])   . Chest pain    a. 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  . Depression   . Expressive aphasia    a. ongoing since 04/2011 - seen by neurology - ? TIA vs. Migraine  . Facial numbness    a. ongoing since 04/2011 - seen by neurology - ? TIA vs. Migraine  . GERD (gastroesophageal reflux disease)   . Low blood pressure   . Presence of permanent cardiac  pacemaker   . 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 Syncope    a. 04/2011 Echo: EF 55-65%, No RWMA, Gr 1 DD.     Past Surgical History:  Procedure Laterality Date  . ABDOMINAL WALL MESH  REMOVAL    . bladder tack    . cataract Bilateral 2004  . CHOLECYSTECTOMY  2000  . DILATION AND CURETTAGE OF UTERUS    . DIRECT LARYNGOSCOPY Right 06/16/2017   Procedure: MICRO DIRECT LARYNGOSCOPY WITH BIOPSY OF RIGHT BASE OF TONGUE;  Surgeon: Carloyn Manner, MD;  Location: [facility] ORS;  Service: ENT;  Laterality: Right;  . EP IMPLANTABLE DEVICE N/A 06/04/2015   Procedure: PPM Generator Changeout;  Surgeon: Deboraha Sprang, MD;  Location: [place] [facility];  Service: Cardiovascular;  Laterality: N/A;  . ESOPHAGOGASTRODUODENOSCOPY (EGD) WITH PROPOFOL N/A 12/18/2015   Procedure: ESOPHAGOGASTRODUODENOSCOPY (EGD) WITH gastric biopsy and dilation;  Surgeon: Lucilla Lame, MD;  Location: [place];  Service: Endoscopy;  Laterality: N/A;  . EYE SURGERY    . INSERT / REPLACE /  REMOVE PACEMAKER    . IR FLUORO GUIDE PORT INSERTION RIGHT  06/22/2017  . PACEMAKER INSERTION  2006   Medtronic Adapta ADDR01  . TONSILLECTOMY    . TONSILLECTOMY    . VAGINAL HYSTERECTOMY  2013   mad/cope    Social History   Socioeconomic History  . Marital status: Married    Spouse name: Not on file  . Number of children: Not on file  . Years of education: Not on file  . Highest education level: Not on file  Occupational History  . Not on file  Social Needs  . Financial resource strain: Not on file  . Food insecurity:    Worry: Not on file    Inability: Not on file  . Transportation needs:    Medical: Not on file    Non-medical: Not on file  Tobacco Use  . Smoking status: Never Smoker  . Smokeless tobacco: Never Used  . Tobacco comment: tobacco use -no  Substance and Sexual Activity  . Alcohol use: No  . Drug use: No  . Sexual activity: Yes    Birth control/protection:  Surgical  Lifestyle  . Physical activity:    Days per week: Not on file    Minutes per session: Not on file  . Stress: Not on file  Relationships  . Social connections:    Talks on phone: Not on file    Gets together: Not on file    Attends religious service: Not on file    Active member of club or organization: Not on file    Attends meetings of clubs or organizations: Not on file    Relationship status: Not on file  . Intimate partner violence:    Fear of current or ex partner: Not on file    Emotionally abused: Not on file    Physically abused: Not on file    Forced sexual activity: Not on file  Other Topics Concern  . Not on file  Social History Narrative   Lives in [place] with husband.  Works out regularly.  Works as a Geophysicist/field seismologist - owns own business.     Family History  Problem Relation Age of Onset  . COPD Mother        alive @ 75  . Atrial fibrillation Mother   . Stroke Father        died @ 31  . Breast cancer Maternal Aunt 70  . COPD Maternal Grandmother   . Ovarian cancer Neg Hx   . Diabetes Neg Hx      Current Outpatient Medications:  .  aspirin 81 MG tablet, Take 81 mg by mouth daily., Disp: , Rfl:  .  b complex vitamins tablet, Take 1 tablet by mouth daily., Disp: , Rfl:  .  BLACK ELDERBERRY,BERRY-FLOWER, PO, Take 1 capsule by mouth daily., Disp: , Rfl:  .  CRANBERRY SOFT PO, Take 2 each by mouth daily. , Disp: , Rfl:  .  Digestive Enzymes (DIGESTIVE ENZYME PO), Take 1-2 capsules by mouth daily., Disp: , Rfl:  .  NON FORMULARY, Take by mouth daily. Bone Up (calcium) , Disp: , Rfl:  .  PRESCRIPTION MEDICATION, Place 1 tablet vaginally 3 (three) times a week. E3 1mg  and Testosterone 1mg  compound, Disp: , Rfl:  .  rivaroxaban (XARELTO) 20 MG TABS tablet, TAKE 1 TABLET(20 MG) BY MOUTH DAILY WITH SUPPER, Disp: 30 tablet, Rfl: 1 .  nitroGLYCERIN (NITROSTAT) 0.4 MG SL tablet, Place 1 tablet (0.4 mg total)  under the tongue every 5 (five) minutes as  needed for chest pain. (Patient not taking: Reported on 03/31/2018), Disp: 25 tablet, Rfl: 3 No current facility-administered medications for this visit.   Facility-Administered Medications Ordered in Other Visits:  .  0.9 %  sodium chloride infusion, , Intravenous, Once, Sindy Guadeloupe, MD  Physical exam:  Vitals:   03/31/18 1516  BP: 96/65  Pulse: 70  Resp: 18  Temp: 97.8 F (36.6 C)  TempSrc: Tympanic  SpO2: 100%  Weight: 144 lb 3.2 oz (65.4 kg)  Height: 5\' 4"  (1.626 m)   Physical Exam  Constitutional: She is oriented to person, place, and time. She appears well-developed and well-nourished.  HENT:  Head: Normocephalic and atraumatic.  Mouth/Throat: Oropharynx is clear and moist.  Eyes: Pupils are equal, round, and reactive to light. EOM are normal.  Neck: Normal range of motion.  Cardiovascular: Normal rate, regular rhythm and normal heart sounds.  Pulmonary/Chest: Effort normal and breath sounds normal.  Abdominal: Soft. Bowel sounds are normal.  Lymphadenopathy:  No palpable cervical adenopathy  Neurological: She is alert and oriented to person, place, and time.  Skin: Skin is warm and dry.     CMP Latest Ref Rng & Units 12/13/2017  Glucose 65 - 99 mg/dL 88  BUN 6 - 20 mg/dL 14  Creatinine 0.44 - 1.00 mg/dL 0.74  Sodium 135 - 145 mmol/L 142  Potassium 3.5 - 5.1 mmol/L 3.8  Chloride 101 - 111 mmol/L 107  CO2 22 - 32 mmol/L 27  Calcium 8.9 - 10.3 mg/dL 9.5  Total Protein 6.5 - 8.1 g/dL 6.4(L)  Total Bilirubin 0.3 - 1.2 mg/dL 1.0  Alkaline Phos 38 - 126 U/L 68  AST 15 - 41 U/L 23  ALT 14 - 54 U/L 22   CBC Latest Ref Rng & Units 03/31/2018  WBC 3.6 - 11.0 K/uL 3.9  Hemoglobin 12.0 - 16.0 g/dL 12.4  Hematocrit 35.0 - 47.0 % 36.2  Platelets 150 - 440 K/uL 142(L)    Assessment and plan- Patient is a 62 y.o. female  with history of HPV positive squamous cell carcinoma of the oropharynx stage I status post concurrent chemoradiation currently in remission and under  surveillance  1. Clinically she is doing well. No evidence of recurrence on todays exam. She also sees ENT q3 months. I will see her back in 6 months with cbc/cmp  2. LLE DVT- she has completed 6 months of anticoagulation. Her DVT was right after chemo and no pror h/o DVT. I do think she can stop her anticoagulation at this time. I will also touch base with vascular surgery regarding this. Repeat doppler in July had also shown resolution of DVT. Regardless that does not have to be factored in deciding length of anticoagulation.  3. Her port can be removed once anticoagulation is stopped.  4. She was noted have normocytic anemia on previous labs. Check cbc, ferritin and iron studies and b12, folate at next visit   Visit Diagnosis 1. Squamous cell carcinoma of oropharynx ([place])   2. Chronic deep vein thrombosis (DVT) of proximal vein of left lower extremity ([place])      Dr. Randa Evens, MD, MPH [facility] at [facility] [phone] 04/03/2018 10:59 AM

## 2018-04-04 ENCOUNTER — Other Ambulatory Visit: Payer: Self-pay | Admitting: Oncology

## 2018-04-04 ENCOUNTER — Other Ambulatory Visit: Payer: Self-pay | Admitting: *Deleted

## 2018-04-04 DIAGNOSIS — C109 Malignant neoplasm of oropharynx, unspecified: Secondary | ICD-10-CM

## 2018-04-05 ENCOUNTER — Telehealth: Payer: Self-pay

## 2018-04-05 LAB — CUP PACEART REMOTE DEVICE CHECK
Battery Impedance: 163 Ohm
Battery Voltage: 2.78 V
Brady Statistic AP VS Percent: 12 %
Date Time Interrogation Session: 20190903085624
Implantable Lead Location: 753859
Lead Channel Pacing Threshold Amplitude: 1.125 V
Lead Channel Pacing Threshold Pulse Width: 0.4 ms
Lead Channel Pacing Threshold Pulse Width: 0.4 ms
Lead Channel Setting Pacing Amplitude: 2.5 V
Lead Channel Setting Pacing Pulse Width: 0.4 ms
Lead Channel Setting Sensing Sensitivity: 4 mV
MDC IDC LEAD IMPLANT DT: 20061128
MDC IDC LEAD IMPLANT DT: 20061128
MDC IDC LEAD LOCATION: 753860
MDC IDC MSMT BATTERY REMAINING LONGEVITY: 144 mo
MDC IDC MSMT LEADCHNL RA IMPEDANCE VALUE: 657 Ohm
MDC IDC MSMT LEADCHNL RA PACING THRESHOLD AMPLITUDE: 0.375 V
MDC IDC MSMT LEADCHNL RV IMPEDANCE VALUE: 686 Ohm
MDC IDC PG IMPLANT DT: 20161123
MDC IDC SET LEADCHNL RA PACING AMPLITUDE: 2 V
MDC IDC STAT BRADY AP VP PERCENT: 0 %
MDC IDC STAT BRADY AS VP PERCENT: 0 %
MDC IDC STAT BRADY AS VS PERCENT: 88 %

## 2018-04-05 NOTE — Telephone Encounter (Signed)
Left Message - On the patient voice mail to inform she has been schedule for port removal on 04/14/18 at 1:00pm for a 2:00pm procedure.

## 2018-04-06 ENCOUNTER — Encounter (INDEPENDENT_AMBULATORY_CARE_PROVIDER_SITE_OTHER): Payer: BLUE CROSS/BLUE SHIELD

## 2018-04-06 ENCOUNTER — Ambulatory Visit (INDEPENDENT_AMBULATORY_CARE_PROVIDER_SITE_OTHER): Payer: BLUE CROSS/BLUE SHIELD | Admitting: Vascular Surgery

## 2018-04-10 ENCOUNTER — Ambulatory Visit (INDEPENDENT_AMBULATORY_CARE_PROVIDER_SITE_OTHER): Payer: BLUE CROSS/BLUE SHIELD | Admitting: Vascular Surgery

## 2018-04-10 ENCOUNTER — Encounter

## 2018-04-10 ENCOUNTER — Telehealth (INDEPENDENT_AMBULATORY_CARE_PROVIDER_SITE_OTHER): Payer: Self-pay | Admitting: Vascular Surgery

## 2018-04-10 ENCOUNTER — Encounter (INDEPENDENT_AMBULATORY_CARE_PROVIDER_SITE_OTHER): Payer: BLUE CROSS/BLUE SHIELD

## 2018-04-10 ENCOUNTER — Telehealth: Payer: Self-pay | Admitting: *Deleted

## 2018-04-10 NOTE — Telephone Encounter (Signed)
Called the patient back to let her know that she will need to make a follow-up DVT ultrasound appointment, so that they can make sure that the DVT is completely sone and then at that point she may be able to come off of her Xarelto. They patient states that she is going to call back after lunch to set this appointment.

## 2018-04-10 NOTE — Telephone Encounter (Signed)
Called pt back to tell her that she is fine to get port removed at the end of this week.  She missed her vascular appt and it does not have any baring on the port being removed. She should have stopped her med last Friday. She just needs to reschedule with vascular the appt she missed.

## 2018-04-10 NOTE — Telephone Encounter (Signed)
Unfortunately, the patient was a 1/2 hour late for her ultrasound and needed to be rescheduled.  As per her last ultrasound which was completed in July 2019, the impression is as follows "No evidence of acute DVT within the left lower extremity. Mixed occlusive, though predominantly nonocclusive, wall thickening/chronic DVT within the left femoral vein extending to involve the left popliteal vein, improved compared to the 09/2017 examination. Resolved DVT within the left tibial veins. Resolved SVT within the left lesser saphenous vein."  The patient should have another ultrasound to assess the status of her DVT.  Depending on what the ultrasound shows she may be able to stop Xarelto - however I need an updated ultrasound.

## 2018-04-10 NOTE — Telephone Encounter (Signed)
Patient saw you last in July, please advise.

## 2018-04-10 NOTE — Telephone Encounter (Signed)
Patient calling asking to speak with Judeen Hammans because she missed her [place] appointment this morning and she doesn't know how this will affect her getting her port removed Friday. Please return her call [phone]

## 2018-04-11 DIAGNOSIS — Z85818 Personal history of malignant neoplasm of other sites of lip, oral cavity, and pharynx: Secondary | ICD-10-CM | POA: Diagnosis not present

## 2018-04-11 DIAGNOSIS — R682 Dry mouth, unspecified: Secondary | ICD-10-CM | POA: Diagnosis not present

## 2018-04-13 ENCOUNTER — Other Ambulatory Visit: Payer: Self-pay | Admitting: Radiology

## 2018-04-14 ENCOUNTER — Ambulatory Visit
Admission: RE | Admit: 2018-04-14 | Discharge: 2018-04-14 | Disposition: A | Discharge status: Home / Self Care | Payer: BLUE CROSS/BLUE SHIELD | Source: Ambulatory Visit | Attending: Oncology | Admitting: Oncology

## 2018-04-14 DIAGNOSIS — Z885 Allergy status to narcotic agent status: Secondary | ICD-10-CM | POA: Diagnosis not present

## 2018-04-14 DIAGNOSIS — Z7982 Long term (current) use of aspirin: Secondary | ICD-10-CM | POA: Insufficient documentation

## 2018-04-14 DIAGNOSIS — F419 Anxiety disorder, unspecified: Secondary | ICD-10-CM | POA: Diagnosis not present

## 2018-04-14 DIAGNOSIS — Z950 Presence of cardiac pacemaker: Secondary | ICD-10-CM | POA: Insufficient documentation

## 2018-04-14 DIAGNOSIS — Z452 Encounter for adjustment and management of vascular access device: Secondary | ICD-10-CM | POA: Diagnosis not present

## 2018-04-14 DIAGNOSIS — Z882 Allergy status to sulfonamides status: Secondary | ICD-10-CM | POA: Diagnosis not present

## 2018-04-14 DIAGNOSIS — C109 Malignant neoplasm of oropharynx, unspecified: Secondary | ICD-10-CM

## 2018-04-14 DIAGNOSIS — K219 Gastro-esophageal reflux disease without esophagitis: Secondary | ICD-10-CM | POA: Diagnosis not present

## 2018-04-14 DIAGNOSIS — F329 Major depressive disorder, single episode, unspecified: Secondary | ICD-10-CM | POA: Insufficient documentation

## 2018-04-14 DIAGNOSIS — Z880 Allergy status to penicillin: Secondary | ICD-10-CM | POA: Insufficient documentation

## 2018-04-14 DIAGNOSIS — Z7901 Long term (current) use of anticoagulants: Secondary | ICD-10-CM | POA: Insufficient documentation

## 2018-04-14 DIAGNOSIS — I441 Atrioventricular block, second degree: Secondary | ICD-10-CM | POA: Insufficient documentation

## 2018-04-14 DIAGNOSIS — Z8719 Personal history of other diseases of the digestive system: Secondary | ICD-10-CM | POA: Insufficient documentation

## 2018-04-14 HISTORY — PX: IR REMOVAL TUN ACCESS W/ PORT W/O FL MOD SED: IMG2290

## 2018-04-14 LAB — BASIC METABOLIC PANEL
ANION GAP: 9 (ref 5–15)
BUN: 16 mg/dL (ref 8–23)
CO2: 27 mmol/L (ref 22–32)
Calcium: 8.9 mg/dL (ref 8.9–10.3)
Chloride: 107 mmol/L (ref 98–111)
Creatinine, Ser: 0.69 mg/dL (ref 0.44–1.00)
GFR calc Af Amer: >60 mL/min (ref >60)
Glucose, Bld: 96 mg/dL (ref 70–99)
Potassium: 3.9 mmol/L (ref 3.5–5.1)
SODIUM: 143 mmol/L (ref 135–145)

## 2018-04-14 LAB — CBC
HCT: 36 % (ref 35.0–47.0)
HEMOGLOBIN: 12.5 g/dL (ref 12.0–16.0)
MCH: 31.9 pg (ref 26.0–34.0)
MCHC: 34.6 g/dL (ref 32.0–36.0)
MCV: 92 fL (ref 80.0–100.0)
Platelets: 122 10*3/uL — ABNORMAL LOW (ref 150–440)
RBC: 3.91 MIL/uL (ref 3.80–5.20)
RDW: 13.2 % (ref 11.5–14.5)
WBC: 3.6 10*3/uL (ref 3.6–11.0)

## 2018-04-14 LAB — PROTIME-INR
INR: 1.04
Prothrombin Time: 13.5 seconds (ref 11.4–15.2)

## 2018-04-14 MED ORDER — MIDAZOLAM HCL 5 MG/5ML IJ SOLN
INTRAMUSCULAR | Status: AC
  Filled 2018-04-14: qty 5

## 2018-04-14 MED ORDER — VANCOMYCIN HCL IN DEXTROSE 1-5 GM/200ML-% IV SOLN
1000.0000 mg | INTRAVENOUS | Status: AC
  Administered 2018-04-14: 1000 mg via INTRAVENOUS
  Filled 2018-04-14: qty 200

## 2018-04-14 MED ORDER — DIPHENHYDRAMINE HCL 25 MG PO CAPS
ORAL_CAPSULE | ORAL | Status: AC
  Filled 2018-04-14: qty 2

## 2018-04-14 MED ORDER — FENTANYL CITRATE (PF) 100 MCG/2ML IJ SOLN
INTRAMUSCULAR | Status: AC
  Filled 2018-04-14: qty 4

## 2018-04-14 MED ORDER — MIDAZOLAM HCL 5 MG/5ML IJ SOLN
INTRAMUSCULAR | Status: AC | PRN
  Administered 2018-04-14: 1 mg via INTRAVENOUS

## 2018-04-14 MED ORDER — FENTANYL CITRATE (PF) 100 MCG/2ML IJ SOLN
INTRAMUSCULAR | Status: AC | PRN
  Administered 2018-04-14: 50 ug via INTRAVENOUS

## 2018-04-14 MED ORDER — LIDOCAINE HCL (PF) 1 % IJ SOLN
INTRAMUSCULAR | Status: AC | PRN
  Administered 2018-04-14: 10 mL

## 2018-04-14 MED ORDER — LIDOCAINE-EPINEPHRINE (PF) 1 %-1:200000 IJ SOLN
INTRAMUSCULAR | Status: AC
  Filled 2018-04-14: qty 30

## 2018-04-14 MED ORDER — DIPHENHYDRAMINE HCL 50 MG PO CAPS
50.0000 mg | ORAL_CAPSULE | Freq: Once | ORAL | Status: AC
  Administered 2018-04-14: 50 mg via ORAL

## 2018-04-14 MED ORDER — SODIUM CHLORIDE 0.9 % IV SOLN
INTRAVENOUS | Status: DC
  Administered 2018-04-14 (×2): via INTRAVENOUS

## 2018-04-14 NOTE — H&P (Signed)
Chief Complaint: Patient was seen in consultation today for oropharyngeal squamous cell carcinoma  Referring Physician(s): Rao,Archana C  Supervising Physician: Arne Cleveland  Patient Status: [facility] - Out-pt  History of Present Illness: Wendy Kelly is a 62 y.o. female with past medical history of anxiety, GERD, 2nd degree heart block and squamous cell carcinoma of the orophanrynx is known to radiology from Port-A-Cath placement 06/22/2017 by Dr. Vernard Gambles.  She has completed therapy and is now referred back to IR for Port-A-Cath removal at the request of Dr. Janese Banks.  Patient presents in her usual state of health today. She denies fever, chills, cough, shortness of breath, abdominal pain, nausea, dysuria.  She has been NPO.  She has not taken her Xarelto since 9/29.   Past Medical History:  Diagnosis Date  . AA (aortic aneurysm) ([place])    a. 04/2011 Echo: Ao Root: 4.1cm, Asc Ao 4.7cm.  . Anxiety   . AV block, 2nd degree    a. 05/2005 - s/p MDT Adapta ADDR01 Dual Chamber PPM  . Bicuspid aortic valve   . Cancer ([place])   . Chest pain    a. 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  . Depression   . Expressive aphasia    a. ongoing since 04/2011 - seen by neurology - ? TIA vs. Migraine  . Facial numbness    a. ongoing since 04/2011 - seen by neurology - ? TIA vs. Migraine  . GERD (gastroesophageal reflux disease)   . Low blood pressure   . Presence of permanent cardiac pacemaker   . 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 Syncope    a. 04/2011 Echo: EF 55-65%, No RWMA, Gr 1 DD.    Past Surgical History:  Procedure Laterality Date  . ABDOMINAL WALL MESH  REMOVAL    . bladder tack    . cataract Bilateral 2004  . CHOLECYSTECTOMY  2000  . DILATION AND CURETTAGE OF UTERUS    . DIRECT LARYNGOSCOPY Right 06/16/2017   Procedure: MICRO DIRECT LARYNGOSCOPY WITH BIOPSY OF RIGHT BASE OF TONGUE;  Surgeon: Carloyn Manner, MD;  Location: [facility] ORS;  Service: ENT;   Laterality: Right;  . EP IMPLANTABLE DEVICE N/A 06/04/2015   Procedure: PPM Generator Changeout;  Surgeon: Deboraha Sprang, MD;  Location: [place] [facility];  Service: Cardiovascular;  Laterality: N/A;  . ESOPHAGOGASTRODUODENOSCOPY (EGD) WITH PROPOFOL N/A 12/18/2015   Procedure: ESOPHAGOGASTRODUODENOSCOPY (EGD) WITH gastric biopsy and dilation;  Surgeon: Lucilla Lame, MD;  Location: [place];  Service: Endoscopy;  Laterality: N/A;  . EYE SURGERY    . INSERT / REPLACE / REMOVE PACEMAKER    . IR FLUORO GUIDE PORT INSERTION RIGHT  06/22/2017  . PACEMAKER INSERTION  2006   Medtronic Adapta ADDR01  . TONSILLECTOMY    . TONSILLECTOMY    . VAGINAL HYSTERECTOMY  2013   mad/cope    Allergies: Cefdinir; Cephalosporins; Dairy aid [lactase]; Flagyl [metronidazole]; Lactose intolerance (gi); Penicillins; Phenylephrine-guaifenesin; Sulfa antibiotics; Sulfonamide derivatives; Tape; Dexilant [dexlansoprazole]; Entex lq [phenylephrine-guaifenesin]; Other; Oxycodone; Succinylcholine; Tetracycline; Codeine; Guaifenesin & derivatives; Lactobacillus; Naproxen sodium; Pseudoephedrine; Tetracyclines & related; and Wheat bran  Medications: Prior to Admission medications   Medication Sig Start Date End Date Taking? Authorizing Provider  b complex vitamins tablet Take 1 tablet by mouth daily.   Yes [provider]  BLACK ELDERBERRY,BERRY-FLOWER, PO Take 1 capsule by mouth daily.   Yes [provider]  CRANBERRY SOFT PO Take 2 each by mouth daily.  Yes [provider]  Digestive Enzymes (DIGESTIVE ENZYME PO) Take 1-2 capsules by mouth daily.   Yes [provider]  NON FORMULARY Take by mouth daily. Bone Up (calcium)    Yes [provider]  PRESCRIPTION MEDICATION Place 1 tablet vaginally 3 (three) times a week. E3 1mg  and Testosterone 1mg  compound   Yes [provider]  aspirin 81 MG tablet Take 81 mg by mouth daily.    [provider]    nitroGLYCERIN (NITROSTAT) 0.4 MG SL tablet Place 1 tablet (0.4 mg total) under the tongue every 5 (five) minutes as needed for chest pain. Patient not taking: Reported on 03/31/2018 12/16/16   Minna Merritts, MD  rivaroxaban (XARELTO) 20 MG TABS tablet TAKE 1 TABLET(20 MG) BY MOUTH DAILY WITH SUPPER 03/08/18   Sindy Guadeloupe, MD     Family History  Problem Relation Age of Onset  . COPD Mother        alive @ 62  . Atrial fibrillation Mother   . Stroke Father        died @ 55  . Breast cancer Maternal Aunt 70  . COPD Maternal Grandmother   . Ovarian cancer Neg Hx   . Diabetes Neg Hx     Social History   Socioeconomic History  . Marital status: Married    Spouse name: Not on file  . Number of children: Not on file  . Years of education: Not on file  . Highest education level: Not on file  Occupational History  . Not on file  Social Needs  . Financial resource strain: Not very hard  . Food insecurity:    Worry: Never true    Inability: Never true  . Transportation needs:    Medical: No    Non-medical: No  Tobacco Use  . Smoking status: Never Smoker  . Smokeless tobacco: Never Used  . Tobacco comment: tobacco use -no  Substance and Sexual Activity  . Alcohol use: No  . Drug use: No  . Sexual activity: Yes    Birth control/protection: Surgical  Lifestyle  . Physical activity:    Days per week: 4 days    Minutes per session: 60 min  . Stress: Rather much  Relationships  . Social connections:    Talks on phone: More than three times a week    Gets together: Not on file    Attends religious service: More than 4 times per year    Active member of club or organization: Not on file    Attends meetings of clubs or organizations: Not on file    Relationship status: Married  Other Topics Concern  . Not on file  Social History Narrative   Lives in [place] with husband.  Works out regularly.  Works as a Geophysicist/field seismologist - owns own business.      Review of  Systems: A 12 point ROS discussed and pertinent positives are indicated in the HPI above.  All other systems are negative.  Review of Systems  Constitutional: Negative for fatigue and fever.  Respiratory: Negative for cough and shortness of breath.   Cardiovascular: Negative for chest pain.  Gastrointestinal: Negative for abdominal pain, nausea and vomiting.  Genitourinary: Negative for dysuria.  Musculoskeletal: Negative for back pain.  Psychiatric/Behavioral: Negative for behavioral problems and confusion.    Vital Signs: BP (!) 106/58   Pulse 80   Temp 98.2 F (36.8 C) (Oral)   Resp 20   Ht 5'  4" (1.626 m)   Wt 144 lb (65.3 kg)   SpO2 99%   BMI 24.72 kg/m   Physical Exam  Constitutional: She is oriented to person, place, and time. She appears well-developed. No distress.  Cardiovascular: Normal rate, regular rhythm and normal heart sounds.  Pulmonary/Chest: Effort normal and breath sounds normal. No respiratory distress.  Abdominal: Soft. Bowel sounds are normal. She exhibits no distension.  Neurological: She is alert and oriented to person, place, and time.  Skin: Skin is warm and dry. She is not diaphoretic.  Psychiatric: She has a normal mood and affect. Her behavior is normal. Judgment and thought content normal.  Nursing note and vitals reviewed.    MD Evaluation Airway: WNL Heart: WNL Abdomen: WNL Chest/ Lungs: WNL ASA  Classification: 2 Mallampati/Airway Score: One   Imaging: No results found.  Labs:  CBC: Recent Labs    10/18/17 1031 12/13/17 0900 03/31/18 1459 04/14/18 1335  WBC 3.1* 3.0* 3.9 3.6  HGB 11.4* 13.1 12.4 12.5  HCT 33.6* 38.1 36.2 36.0  PLT 122* 130* 142* 122*    COAGS: Recent Labs    06/22/17 1240  INR 0.93  APTT 26    BMP: Recent Labs    09/13/17 1049 10/18/17 1031 12/13/17 0900 04/14/18 1335  NA 141 140 142 143  K 3.9 4.0 3.8 3.9  CL 109 107 107 107  CO2 25 25 27 27   GLUCOSE 93 91 88 96  BUN 13 11 14 16     CALCIUM 8.8* 9.3 9.5 8.9  CREATININE 0.70 0.69 0.74 0.69  GFRNONAA >60 >60 >60 >60  GFRAA >60 >60 >60 >60    LIVER FUNCTION TESTS: Recent Labs    09/08/17 1506 09/13/17 1049 10/18/17 1031 12/13/17 0900  BILITOT 0.5 0.3 1.1 1.0  AST 24 17 20 23   ALT 22 16 18 22   ALKPHOS 87 82 57 68  PROT 5.9* 6.0* 6.3* 6.4*  ALBUMIN 3.2* 3.1* 3.9 4.0    TUMOR MARKERS: No results for input(s): AFPTM, CEA, CA199, CHROMGRNA in the last 8760 hours.  Assessment and Plan: Patient with past medical history of squamous cell carcinoma of the orophayrnx s/p Port-A-Cath placement in 06/2017  presents at the completion of treatment for Port-A-Cath removal.  Patient presents today in their usual state of health.  She has been NPO and is not currently on blood thinners as she has held since last week.   Risks and benefits of image guided port-a-catheter placement was discussed with the patient including, but not limited to bleeding, infection, pneumothorax, need for additional procedures.  All of the patient's questions were answered, patient is agreeable to proceed. Consent signed and in chart.  Thank you for this interesting consult.  I greatly enjoyed meeting Wendy Kelly and look forward to participating in their care.  A copy of this report was sent to the requesting provider on this date.  Electronically Signed: Docia Barrier, PA 04/14/2018, 1:53 PM   I spent a total of    15 Minutes in face to face in clinical consultation, greater than 50% of which was counseling/coordinating care for squamous cell carcinoma.

## 2018-04-14 NOTE — Procedures (Signed)
  Procedure: R IJ port removal   EBL:   minimal Complications:  none immediate  See full dictation in [facility].  Dillard Cannon MD Main # [phone] Pager  [phone]

## 2018-04-14 NOTE — Progress Notes (Signed)
Pt. C/o itching to face & head; face "flush". Kellie Moor, RN called Dr. Susanne Greenhouse orders received for p.o. Benadryl .

## 2018-04-20 ENCOUNTER — Encounter: Payer: Self-pay | Admitting: Family Medicine

## 2018-04-20 ENCOUNTER — Ambulatory Visit (INDEPENDENT_AMBULATORY_CARE_PROVIDER_SITE_OTHER): Payer: BLUE CROSS/BLUE SHIELD | Admitting: Family Medicine

## 2018-04-20 VITALS — BP 100/66 | HR 60 | Temp 97.7°F | Ht 62.7 in | Wt 139.4 lb

## 2018-04-20 DIAGNOSIS — C109 Malignant neoplasm of oropharynx, unspecified: Secondary | ICD-10-CM | POA: Diagnosis not present

## 2018-04-20 DIAGNOSIS — I480 Paroxysmal atrial fibrillation: Secondary | ICD-10-CM

## 2018-04-20 DIAGNOSIS — Z0000 Encounter for general adult medical examination without abnormal findings: Secondary | ICD-10-CM | POA: Diagnosis not present

## 2018-04-20 DIAGNOSIS — I82592 Chronic embolism and thrombosis of other specified deep vein of left lower extremity: Secondary | ICD-10-CM

## 2018-04-20 DIAGNOSIS — Z1159 Encounter for screening for other viral diseases: Secondary | ICD-10-CM

## 2018-04-20 DIAGNOSIS — R5382 Chronic fatigue, unspecified: Secondary | ICD-10-CM

## 2018-04-20 LAB — UA/M W/RFLX CULTURE, ROUTINE
BILIRUBIN UA: NEGATIVE
GLUCOSE, UA: NEGATIVE
KETONES UA: NEGATIVE
Nitrite, UA: NEGATIVE
PROTEIN UA: NEGATIVE
RBC UA: NEGATIVE
SPEC GRAV UA: 1.015 (ref 1.005–1.030)
Urobilinogen, Ur: 0.2 mg/dL (ref 0.2–1.0)
pH, UA: 6 (ref 5.0–7.5)

## 2018-04-20 LAB — MICROSCOPIC EXAMINATION: Bacteria, UA: NONE SEEN

## 2018-04-20 LAB — BAYER DCA HB A1C WAIVED: HB A1C: 5.1 % (ref <7.0)

## 2018-04-20 NOTE — Assessment & Plan Note (Signed)
Likely multifactorial. Will check labs. Await results. Call with any concerns.

## 2018-04-20 NOTE — Assessment & Plan Note (Signed)
Has finished her treatment. Continue to follow with oncology as needed. Call with any concerns.

## 2018-04-20 NOTE — Patient Instructions (Signed)
Health Maintenance for Postmenopausal Women Menopause is a normal process in which your reproductive ability comes to an end. This process happens gradually over a span of months to years, usually between the ages of 22 and 9. Menopause is complete when you have missed 12 consecutive menstrual periods. It is important to talk with your health care provider about some of the most common conditions that affect postmenopausal women, such as heart disease, cancer, and bone loss (osteoporosis). Adopting a healthy lifestyle and getting preventive care can help to promote your health and wellness. Those actions can also lower your chances of developing some of these common conditions. What should I know about menopause? During menopause, you may experience a number of symptoms, such as:  Moderate-to-severe hot flashes.  Night sweats.  Decrease in sex drive.  Mood swings.  Headaches.  Tiredness.  Irritability.  Memory problems.  Insomnia.  Choosing to treat or not to treat menopausal changes is an individual decision that you make with your health care provider. What should I know about hormone replacement therapy and supplements? Hormone therapy products are effective for treating symptoms that are associated with menopause, such as hot flashes and night sweats. Hormone replacement carries certain risks, especially as you become older. If you are thinking about using estrogen or estrogen with progestin treatments, discuss the benefits and risks with your health care provider. What should I know about heart disease and stroke? Heart disease, heart attack, and stroke become more likely as you age. This may be due, in part, to the hormonal changes that your body experiences during menopause. These can affect how your body processes dietary fats, triglycerides, and cholesterol. Heart attack and stroke are both medical emergencies. There are many things that you can do to help prevent heart disease  and stroke:  Have your blood pressure checked at least every 1-2 years. High blood pressure causes heart disease and increases the risk of stroke.  If you are 53-22 years old, ask your health care provider if you should take aspirin to prevent a heart attack or a stroke.  Do not use any tobacco products, including cigarettes, chewing tobacco, or electronic cigarettes. If you need help quitting, ask your health care provider.  It is important to eat a healthy diet and maintain a healthy weight. ? Be sure to include plenty of vegetables, fruits, low-fat dairy products, and lean protein. ? Avoid eating foods that are high in solid fats, added sugars, or salt (sodium).  Get regular exercise. This is one of the most important things that you can do for your health. ? Try to exercise for at least 150 minutes each week. The type of exercise that you do should increase your heart rate and make you sweat. This is known as moderate-intensity exercise. ? Try to do strengthening exercises at least twice each week. Do these in addition to the moderate-intensity exercise.  Know your numbers.Ask your health care provider to check your cholesterol and your blood glucose. Continue to have your blood tested as directed by your health care provider.  What should I know about cancer screening? There are several types of cancer. Take the following steps to reduce your risk and to catch any cancer development as early as possible. Breast Cancer  Practice breast self-awareness. ? This means understanding how your breasts normally appear and feel. ? It also means doing regular breast self-exams. Let your health care provider know about any changes, no matter how small.  If you are 40  or older, have a clinician do a breast exam (clinical breast exam or CBE) every year. Depending on your age, family history, and medical history, it may be recommended that you also have a yearly breast X-ray (mammogram).  If you  have a family history of breast cancer, talk with your health care provider about genetic screening.  If you are at high risk for breast cancer, talk with your health care provider about having an MRI and a mammogram every year.  Breast cancer (BRCA) gene test is recommended for women who have family members with BRCA-related cancers. Results of the assessment will determine the need for genetic counseling and BRCA1 and for BRCA2 testing. BRCA-related cancers include these types: ? Breast. This occurs in males or females. ? Ovarian. ? Tubal. This may also be called fallopian tube cancer. ? Cancer of the abdominal or pelvic lining (peritoneal cancer). ? Prostate. ? Pancreatic.  Cervical, Uterine, and Ovarian Cancer Your health care provider may recommend that you be screened regularly for cancer of the pelvic organs. These include your ovaries, uterus, and vagina. This screening involves a pelvic exam, which includes checking for microscopic changes to the surface of your cervix (Pap test).  For women ages 21-65, health care providers may recommend a pelvic exam and a Pap test every three years. For women ages 79-65, they may recommend the Pap test and pelvic exam, combined with testing for human papilloma virus (HPV), every five years. Some types of HPV increase your risk of cervical cancer. Testing for HPV may also be done on women of any age who have unclear Pap test results.  Other health care providers may not recommend any screening for nonpregnant women who are considered low risk for pelvic cancer and have no symptoms. Ask your health care provider if a screening pelvic exam is right for you.  If you have had past treatment for cervical cancer or a condition that could lead to cancer, you need Pap tests and screening for cancer for at least 20 years after your treatment. If Pap tests have been discontinued for you, your risk factors (such as having a new sexual partner) need to be  reassessed to determine if you should start having screenings again. Some women have medical problems that increase the chance of getting cervical cancer. In these cases, your health care provider may recommend that you have screening and Pap tests more often.  If you have a family history of uterine cancer or ovarian cancer, talk with your health care provider about genetic screening.  If you have vaginal bleeding after reaching menopause, tell your health care provider.  There are currently no reliable tests available to screen for ovarian cancer.  Lung Cancer Lung cancer screening is recommended for adults 69-62 years old who are at high risk for lung cancer because of a history of smoking. A yearly low-dose CT scan of the lungs is recommended if you:  Currently smoke.  Have a history of at least 30 pack-years of smoking and you currently smoke or have quit within the past 15 years. A pack-year is smoking an average of one pack of cigarettes per day for one year.  Yearly screening should:  Continue until it has been 15 years since you quit.  Stop if you develop a health problem that would prevent you from having lung cancer treatment.  Colorectal Cancer  This type of cancer can be detected and can often be prevented.  Routine colorectal cancer screening usually begins at  age 42 and continues through age 45.  If you have risk factors for colon cancer, your health care provider may recommend that you be screened at an earlier age.  If you have a family history of colorectal cancer, talk with your health care provider about genetic screening.  Your health care provider may also recommend using home test kits to check for hidden blood in your stool.  A small camera at the end of a tube can be used to examine your colon directly (sigmoidoscopy or colonoscopy). This is done to check for the earliest forms of colorectal cancer.  Direct examination of the colon should be repeated every  5-10 years until age 71. However, if early forms of precancerous polyps or small growths are found or if you have a family history or genetic risk for colorectal cancer, you may need to be screened more often.  Skin Cancer  Check your skin from head to toe regularly.  Monitor any moles. Be sure to tell your health care provider: ? About any new moles or changes in moles, especially if there is a change in a mole's shape or color. ? If you have a mole that is larger than the size of a pencil eraser.  If any of your family members has a history of skin cancer, especially at a young age, talk with your health care provider about genetic screening.  Always use sunscreen. Apply sunscreen liberally and repeatedly throughout the day.  Whenever you are outside, protect yourself by wearing long sleeves, pants, a wide-brimmed hat, and sunglasses.  What should I know about osteoporosis? Osteoporosis is a condition in which bone destruction happens more quickly than new bone creation. After menopause, you may be at an increased risk for osteoporosis. To help prevent osteoporosis or the bone fractures that can happen because of osteoporosis, the following is recommended:  If you are 46-71 years old, get at least 1,000 mg of calcium and at least 600 mg of vitamin D per day.  If you are older than age 55 but younger than age 65, get at least 1,200 mg of calcium and at least 600 mg of vitamin D per day.  If you are older than age 54, get at least 1,200 mg of calcium and at least 800 mg of vitamin D per day.  Smoking and excessive alcohol intake increase the risk of osteoporosis. Eat foods that are rich in calcium and vitamin D, and do weight-bearing exercises several times each week as directed by your health care provider. What should I know about how menopause affects my mental health? Depression may occur at any age, but it is more common as you become older. Common symptoms of depression  include:  Low or sad mood.  Changes in sleep patterns.  Changes in appetite or eating patterns.  Feeling an overall lack of motivation or enjoyment of activities that you previously enjoyed.  Frequent crying spells.  Talk with your health care provider if you think that you are experiencing depression. What should I know about immunizations? It is important that you get and maintain your immunizations. These include:  Tetanus, diphtheria, and pertussis (Tdap) booster vaccine.  Influenza every year before the flu season begins.  Pneumonia vaccine.  Shingles vaccine.  Your health care provider may also recommend other immunizations. This information is not intended to replace advice given to you by your health care provider. Make sure you discuss any questions you have with your health care provider. Document Released: 08/20/2005  Document Revised: 01/16/2016 Document Reviewed: 04/01/2015 [employer] Interactive Patient Education  2018 [employer]  

## 2018-04-20 NOTE — Progress Notes (Signed)
BP 100/66 (BP Location: Left Arm, Patient Position: Sitting, Cuff Size: Normal)   Pulse 60   Temp 97.7 F (36.5 C)   Ht 5' 2.7" (1.593 m)   Wt 139 lb 7 oz (63.2 kg)   SpO2 99%   BMI 24.94 kg/m    Subjective:    Patient ID: Wendy Kelly, female    DOB: 04/14/1956, 62 y.o.   MRN: [id number]  HPI: Wendy Kelly is a 62 y.o. female presenting on 04/20/2018 for comprehensive medical examination. Current medical complaints include:  Generally feeling pretty well. Still getting very tired. She is starting to feel back to herself. Has finished with chemo and radiation. Off medicine for DVT. No other concerns.   Menopausal Symptoms: no  Depression Screen done today and results listed below:  Depression screen [facility] 2/9 04/20/2018 10/10/2017 04/19/2017 04/13/2016 04/08/2015  Decreased Interest 0 0 0 0 0  Down, Depressed, Hopeless 0 0 0 0 0  PHQ - 2 Score 0 0 0 0 0  Altered sleeping 3 - - - -  Tired, decreased energy 1 - - - -  Change in appetite 0 - - - -  Feeling bad or failure about yourself  0 - - - -  Trouble concentrating 0 - - - -  Moving slowly or fidgety/restless 0 - - - -  Suicidal thoughts 0 - - - -  PHQ-9 Score 4 - - - -  Difficult doing work/chores Not difficult at all - - - -  Some recent data might be hidden    Past Medical History:  Past Medical History:  Diagnosis Date  . AA (aortic aneurysm) ([place])    a. 04/2011 Echo: Ao Root: 4.1cm, Asc Ao 4.7cm.  . Anxiety   . AV block, 2nd degree    a. 05/2005 - s/p MDT Adapta ADDR01 Dual Chamber PPM  . Bicuspid aortic valve   . Cancer ([place])   . Chest pain    a. 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  . Dehydration 08/11/2017  . Depression   . Expressive aphasia    a. ongoing since 04/2011 - seen by neurology - ? TIA vs. Migraine  . Facial numbness    a. ongoing since 04/2011 - seen by neurology - ? TIA vs. Migraine  . GERD (gastroesophageal reflux disease)   . Low blood pressure   . Presence of permanent cardiac pacemaker   . Squamous  cell carcinoma 05/2017   lymph node right side of neck  . Syncope    a. 04/2011 Echo: EF 55-65%, No RWMA, Gr 1 DD.    Surgical History:  Past Surgical History:  Procedure Laterality Date  . ABDOMINAL WALL MESH  REMOVAL    . bladder tack    . cataract Bilateral 2004  . CHOLECYSTECTOMY  2000  . DILATION AND CURETTAGE OF UTERUS    . DIRECT LARYNGOSCOPY Right 06/16/2017   Procedure: MICRO DIRECT LARYNGOSCOPY WITH BIOPSY OF RIGHT BASE OF TONGUE;  Surgeon: Carloyn Manner, MD;  Location: [facility];  Service: ENT;  Laterality: Right;  . EP IMPLANTABLE DEVICE N/A 06/04/2015   Procedure: PPM Generator Changeout;  Surgeon: Deboraha Sprang, MD;  Location: [facility];  Service: Cardiovascular;  Laterality: N/A;  . ESOPHAGOGASTRODUODENOSCOPY (EGD) WITH PROPOFOL N/A 12/18/2015   Procedure: ESOPHAGOGASTRODUODENOSCOPY (EGD) WITH gastric biopsy and dilation;  Surgeon: Lucilla Lame, MD;  Location: [place];  Service: Endoscopy;  Laterality: N/A;  . EYE SURGERY    . INSERT / REPLACE / REMOVE  PACEMAKER    . IR FLUORO GUIDE PORT INSERTION RIGHT  06/22/2017  . IR REMOVAL TUN ACCESS W/ PORT W/O FL MOD SED  04/14/2018  . MOHS SURGERY  02/2018  . PACEMAKER INSERTION  2006   Medtronic Adapta ADDR01  . TONSILLECTOMY    . TONSILLECTOMY    . VAGINAL HYSTERECTOMY  2013   mad/cope    Medications:  Current Outpatient Medications on File Prior to Visit  Medication Sig  . aspirin 81 MG tablet Take 81 mg by mouth daily.  Marland Kitchen b complex vitamins tablet Take 1 tablet by mouth daily.  Marland Kitchen BLACK ELDERBERRY,BERRY-FLOWER, PO Take 1 capsule by mouth daily.  Marland Kitchen CRANBERRY SOFT PO Take 2 each by mouth daily.   . Digestive Enzymes (DIGESTIVE ENZYME PO) Take 1-2 capsules by mouth daily.  . nitroGLYCERIN (NITROSTAT) 0.4 MG SL tablet Place 1 tablet (0.4 mg total) under the tongue every 5 (five) minutes as needed for chest pain. (Patient not taking: Reported on 03/31/2018)  . NON FORMULARY Take by mouth daily. Bone  Up (calcium)   . PRESCRIPTION MEDICATION Place 1 tablet vaginally 3 (three) times a week. E3 1mg  and Testosterone 1mg  compound   Current Facility-Administered Medications on File Prior to Visit  Medication  . 0.9 %  sodium chloride infusion    Allergies:  Allergies  Allergen Reactions  . Cefdinir Swelling    Swelling in mouth   . Cephalosporins Swelling  . Dairy Aid [Lactase] Swelling    Milk and milk products cause swelling and tingling of tongue  . Flagyl [Metronidazole] Swelling    Mouth and throat and ears go red and burn  . Lactose Intolerance (Gi) Other (See Comments)    GI upset.  Eats only gluten free  . Penicillins Rash    Rash/hives  . Phenylephrine-Guaifenesin Anaphylaxis    Night terrors  . Sulfa Antibiotics Rash and Hives    Other reaction(s): Unknown   . Sulfonamide Derivatives Rash    Rash/itching.  From head to her toes, the itching was horrible. Penicillin is the worst  . Tape Rash    Adhesives all cause a problem severely.  It doesn't matter if it is paper tape or not.  Marland Kitchen Dexilant [Dexlansoprazole]     Pressure in head like head is in a vice and being squeezed  . Entex Lq [Phenylephrine-Guaifenesin]     Night terrors  . Other Other (See Comments)    Flu vaccine:  Weakness, dizziness, tia type symptoms.    . Oxycodone Nausea And Vomiting    And itching  . Succinylcholine Other (See Comments)    Trouble waking up  . Vancomycin Rash  . Tetracycline Other (See Comments)    Unkown  . Codeine Nausea And Vomiting  . Guaifenesin & Derivatives Other (See Comments)    Night terrors  . Lactobacillus Itching  . Naproxen Sodium     Itching/rash  . Pseudoephedrine Rash  . Tetracyclines & Related Other (See Comments)    Was taking flagyl at the same time; unsure which caused the swelling  Swelling of throat. Pharmacist states that he believes it was the flagyl and not the tetracycline  . Wheat Bran Other (See Comments)    Upset stomach. Now eats gluten free    Social History:  Social History   Socioeconomic History  . Marital status: Married    Spouse name: Not on file  . Number of children: Not on file  . Years of education: Not on file  .  Highest education level: Not on file  Occupational History  . Not on file  Social Needs  . Financial resource strain: Not very hard  . Food insecurity:    Worry: Never true    Inability: Never true  . Transportation needs:    Medical: No    Non-medical: No  Tobacco Use  . Smoking status: Never Smoker  . Smokeless tobacco: Never Used  . Tobacco comment: tobacco use -no  Substance and Sexual Activity  . Alcohol use: No  . Drug use: No  . Sexual activity: Yes    Birth control/protection: Surgical  Lifestyle  . Physical activity:    Days per week: 4 days    Minutes per session: 60 min  . Stress: Rather much  Relationships  . Social connections:    Talks on phone: More than three times a week    Gets together: Not on file    Attends religious service: More than 4 times per year    Active member of club or organization: Not on file    Attends meetings of clubs or organizations: Not on file    Relationship status: Married  . Intimate partner violence:    Fear of current or ex partner: No    Emotionally abused: No    Physically abused: No    Forced sexual activity: No  Other Topics Concern  . Not on file  Social History Narrative   Lives in [place] with husband.  Works out regularly.  Works as a Geophysicist/field seismologist - owns own business.    Social History   Tobacco Use  Smoking Status Never Smoker  Smokeless Tobacco Never Used  Tobacco Comment   tobacco use -no   Social History   Substance and Sexual Activity  Alcohol Use No    Family History:  Family History  Problem Relation Age of Onset  . COPD Mother        alive @ 51  . Atrial fibrillation Mother   . Stroke Father        died @ 25  . Breast cancer Maternal Aunt 70  . COPD Maternal Grandmother   . Ovarian  cancer Neg Hx   . Diabetes Neg Hx     Past medical history, surgical history, medications, allergies, family history and social history reviewed with patient today and changes made to appropriate areas of the chart.   Review of Systems  Constitutional: Negative.   HENT: Negative.   Eyes: Negative.   Respiratory: Negative.   Cardiovascular: Positive for leg swelling (when on her feet a lot). Negative for chest pain, palpitations, orthopnea, claudication and PND.  Gastrointestinal: Positive for nausea. Negative for abdominal pain, blood in stool, constipation, diarrhea, heartburn, melena and vomiting.  Genitourinary: Negative.   Musculoskeletal: Negative.   Skin: Negative.   Neurological: Positive for tingling (in her hands and feet from chemo). Negative for dizziness, tremors, sensory change, speech change, focal weakness, seizures, loss of consciousness, weakness and headaches.  Endo/Heme/Allergies: Positive for polydipsia (due to dry mouth). Negative for environmental allergies. Does not bruise/bleed easily.  Psychiatric/Behavioral: Negative.     All other ROS negative except what is listed above and in the HPI.      Objective:    BP 100/66 (BP Location: Left Arm, Patient Position: Sitting, Cuff Size: Normal)   Pulse 60   Temp 97.7 F (36.5 C)   Ht 5' 2.7" (1.593 m)   Wt 139 lb 7 oz (63.2 kg)   SpO2  99%   BMI 24.94 kg/m   Wt Readings from Last 3 Encounters:  04/20/18 139 lb 7 oz (63.2 kg)  04/14/18 144 lb (65.3 kg)  03/31/18 144 lb 3.2 oz (65.4 kg)    Physical Exam  Constitutional: She is oriented to person, place, and time. She appears well-developed and well-nourished. No distress.  HENT:  Head: Normocephalic and atraumatic.  Right Ear: Hearing, tympanic membrane, external ear and ear canal normal.  Left Ear: Hearing, tympanic membrane, external ear and ear canal normal.  Nose: Nose normal.  Mouth/Throat: Uvula is midline, oropharynx is clear and moist and mucous  membranes are normal. No oropharyngeal exudate.  Eyes: Pupils are equal, round, and reactive to light. Conjunctivae, EOM and lids are normal. Right eye exhibits no discharge. Left eye exhibits no discharge. No scleral icterus.  Neck: Normal range of motion. Neck supple. No JVD present. No tracheal deviation present. No thyromegaly present.  Cardiovascular: Normal rate, regular rhythm, normal heart sounds and intact distal pulses. Exam reveals no gallop and no friction rub.  No murmur heard. Pulmonary/Chest: Effort normal and breath sounds normal. No stridor. No respiratory distress. She has no wheezes. She has no rales. She exhibits no tenderness. Right breast exhibits no inverted nipple, no mass, no nipple discharge, no skin change and no tenderness. Left breast exhibits no inverted nipple, no mass, no nipple discharge, no skin change and no tenderness. No breast swelling, tenderness, discharge or bleeding. Breasts are symmetrical.  Abdominal: Soft. Bowel sounds are normal. She exhibits no distension and no mass. There is no tenderness. There is no rebound and no guarding. No hernia.  Musculoskeletal: Normal range of motion. She exhibits no edema, tenderness or deformity.  Lymphadenopathy:    She has no cervical adenopathy.  Neurological: She is alert and oriented to person, place, and time. She displays normal reflexes. No cranial nerve deficit or sensory deficit. She exhibits normal muscle tone. Coordination normal.  Skin: Skin is warm, dry and intact. Capillary refill takes less than 2 seconds. No rash noted. She is not diaphoretic. No erythema. No pallor.  Psychiatric: She has a normal mood and affect. Her speech is normal and behavior is normal. Judgment and thought content normal. Cognition and memory are normal.  Nursing note and vitals reviewed.  Breast exam done today with Tiffany Reel, CMA in attendance.  Results for orders placed or performed during the hospital encounter of 16/10/96    Basic metabolic panel  Result Value Ref Range   Sodium 143 135 - 145 mmol/L   Potassium 3.9 3.5 - 5.1 mmol/L   Chloride 107 98 - 111 mmol/L   CO2 27 22 - 32 mmol/L   Glucose, Bld 96 70 - 99 mg/dL   BUN 16 8 - 23 mg/dL   Creatinine, Ser 0.69 0.44 - 1.00 mg/dL   Calcium 8.9 8.9 - 10.3 mg/dL   GFR calc non Af Amer >60 >60 mL/min   GFR calc Af Amer >60 >60 mL/min   Anion gap 9 5 - 15  CBC  Result Value Ref Range   WBC 3.6 3.6 - 11.0 K/uL   RBC 3.91 3.80 - 5.20 MIL/uL   Hemoglobin 12.5 12.0 - 16.0 g/dL   HCT 36.0 35.0 - 47.0 %   MCV 92.0 80.0 - 100.0 fL   MCH 31.9 26.0 - 34.0 pg   MCHC 34.6 32.0 - 36.0 g/dL   RDW 13.2 11.5 - 14.5 %   Platelets 122 (L) 150 - 440 K/uL  Protime-INR  Result Value Ref Range   Prothrombin Time 13.5 11.4 - 15.2 seconds   INR 1.04       Assessment & Plan:   Problem List Items Addressed This Visit      Cardiovascular and Mediastinum   Atrial fibrillation ([place])    Continue to follow with cardiology. Call with any concerns. Continue to monitor. Stable.      Deep vein thrombosis (DVT) of left lower extremity ([place])    Has finished her treatment. Continue to follow with oncology as needed. Call with any concerns.         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 Continue to follow with oncology as needed. Call with any concerns.         Other   Fatigue    Likely multifactorial. Will check labs. Await results. Call with any concerns.       Relevant Orders   Bayer DCA Hb A1c Waived   CBC with Differential/Platelet   Comprehensive metabolic panel   Lipid Panel w/o Chol/HDL Ratio   TSH   UA/M w/rflx Culture, Routine   VITAMIN D 25 Hydroxy (Vit-D Deficiency, Fractures)   B12 and Folate Panel    Other Visit Diagnoses    Routine general medical examination at a health care facility    -  Primary   Vaccines up to date. Screening labs checked today. Pap N/A. Mammogram due in November. Colonoscopy  up to date. Call with any concerns.    Relevant Orders   Bayer DCA Hb A1c Waived   CBC with Differential/Platelet   Comprehensive metabolic panel   Lipid Panel w/o Chol/HDL Ratio   TSH   UA/M w/rflx Culture, Routine   VITAMIN D 25 Hydroxy (Vit-D Deficiency, Fractures)   B12 and Folate Panel   Encounter for hepatitis C screening test for low risk patient       Lab drawn today. Await results.    Relevant Orders   Hepatitis C Antibody       Follow up plan: Return in about 1 year (around 04/21/2019) for Physical.   LABORATORY TESTING:  - Pap smear: not applicable  IMMUNIZATIONS:   - Tdap: Tetanus vaccination status reviewed: last tetanus booster within 10 years. - Influenza: Not applicable - Pneumovax: Not applicable  SCREENING: -Mammogram: Ordered today  - Colonoscopy: Ordered today   PATIENT COUNSELING:   Advised to take 1 mg of folate supplement per day if capable of pregnancy.   Sexuality: Discussed sexually transmitted diseases, partner selection, use of condoms, avoidance of unintended pregnancy  and contraceptive alternatives.   Advised to avoid cigarette smoking.  I discussed with the patient that most people either abstain from alcohol or drink within safe limits (<=14/week and <=4 drinks/occasion for males, <=7/weeks and <= 3 drinks/occasion for females) and that the risk for alcohol disorders and other health effects rises proportionally with the number of drinks per week and how often a drinker exceeds daily limits.  Discussed cessation/primary prevention of drug use and availability of treatment for abuse.   Diet: Encouraged to adjust caloric intake to maintain  or achieve ideal body weight, to reduce intake of dietary saturated fat and total fat, to limit sodium intake by avoiding high sodium foods and not adding table salt, and to maintain adequate dietary potassium and calcium preferably from fresh fruits, vegetables, and low-fat dairy products.    stressed  the importance of regular exercise  Injury prevention:  Discussed safety belts, safety helmets, smoke detector, smoking near bedding or upholstery.   Dental health: Discussed importance of regular tooth brushing, flossing, and dental visits.    NEXT PREVENTATIVE PHYSICAL DUE IN 1 YEAR. Return in about 1 year (around 04/21/2019) for Physical.

## 2018-04-20 NOTE — Assessment & Plan Note (Addendum)
Continue to follow with cardiology. Call with any concerns. Continue to monitor. Stable.

## 2018-04-21 LAB — COMPREHENSIVE METABOLIC PANEL
ALBUMIN: 4.5 g/dL (ref 3.6–4.8)
ALK PHOS: 97 IU/L (ref 39–117)
ALT: 19 IU/L (ref 0–32)
AST: 18 IU/L (ref 0–40)
Albumin/Globulin Ratio: 2.5 — ABNORMAL HIGH (ref 1.2–2.2)
BUN / CREAT RATIO: 15 (ref 12–28)
BUN: 12 mg/dL (ref 8–27)
Bilirubin Total: 1.1 mg/dL (ref 0.0–1.2)
CO2: 25 mmol/L (ref 20–29)
Calcium: 9.3 mg/dL (ref 8.7–10.3)
Chloride: 103 mmol/L (ref 96–106)
Creatinine, Ser: 0.81 mg/dL (ref 0.57–1.00)
GFR calc Af Amer: 89 mL/min/{1.73_m2} (ref >59)
GFR calc non Af Amer: 78 mL/min/{1.73_m2} (ref >59)
GLOBULIN, TOTAL: 1.8 g/dL (ref 1.5–4.5)
Glucose: 83 mg/dL (ref 65–99)
Potassium: 3.6 mmol/L (ref 3.5–5.2)
SODIUM: 144 mmol/L (ref 134–144)
Total Protein: 6.3 g/dL (ref 6.0–8.5)

## 2018-04-21 LAB — LIPID PANEL W/O CHOL/HDL RATIO
CHOLESTEROL TOTAL: 162 mg/dL (ref 100–199)
HDL: 69 mg/dL (ref >39)
LDL CALC: 81 mg/dL (ref 0–99)
TRIGLYCERIDES: 60 mg/dL (ref 0–149)
VLDL Cholesterol Cal: 12 mg/dL (ref 5–40)

## 2018-04-21 LAB — CBC WITH DIFFERENTIAL/PLATELET
Basophils Absolute: 0 10*3/uL (ref 0.0–0.2)
Basos: 1 %
EOS (ABSOLUTE): 0.4 10*3/uL (ref 0.0–0.4)
EOS: 10 %
HEMATOCRIT: 37.7 % (ref 34.0–46.6)
HEMOGLOBIN: 12.6 g/dL (ref 11.1–15.9)
Immature Grans (Abs): 0 10*3/uL (ref 0.0–0.1)
Immature Granulocytes: 0 %
LYMPHS ABS: 0.7 10*3/uL (ref 0.7–3.1)
Lymphs: 15 %
MCH: 30.8 pg (ref 26.6–33.0)
MCHC: 33.4 g/dL (ref 31.5–35.7)
MCV: 92 fL (ref 79–97)
MONOCYTES: 9 %
MONOS ABS: 0.4 10*3/uL (ref 0.1–0.9)
NEUTROS ABS: 2.7 10*3/uL (ref 1.4–7.0)
Neutrophils: 65 %
Platelets: 134 10*3/uL — ABNORMAL LOW (ref 150–450)
RBC: 4.09 x10E6/uL (ref 3.77–5.28)
RDW: 12.1 % — AB (ref 12.3–15.4)
WBC: 4.3 10*3/uL (ref 3.4–10.8)

## 2018-04-21 LAB — B12 AND FOLATE PANEL
FOLATE: 17.5 ng/mL (ref >3.0)
VITAMIN B 12: 409 pg/mL (ref 232–1245)

## 2018-04-21 LAB — HEPATITIS C ANTIBODY

## 2018-04-21 LAB — VITAMIN D 25 HYDROXY (VIT D DEFICIENCY, FRACTURES): Vit D, 25-Hydroxy: 27.2 ng/mL — ABNORMAL LOW (ref 30.0–100.0)

## 2018-04-21 LAB — TSH: TSH: 19 u[IU]/mL — ABNORMAL HIGH (ref 0.450–4.500)

## 2018-04-24 ENCOUNTER — Telehealth: Payer: Self-pay | Admitting: Cardiovascular Disease

## 2018-04-24 ENCOUNTER — Telehealth: Payer: Self-pay | Admitting: Family Medicine

## 2018-04-24 DIAGNOSIS — R7989 Other specified abnormal findings of blood chemistry: Secondary | ICD-10-CM

## 2018-04-24 NOTE — Telephone Encounter (Signed)
Copied from [place] [id number]. Topic: Quick Communication - See Telephone Encounter >> Apr 24, 2018  2:39 PM Gardiner Ramus wrote: CRM for notification. See Telephone encounter for: 04/24/18. Pt calling to check if lab results are back from 04/20/18. Please advise. Cb#[phone]

## 2018-04-24 NOTE — Telephone Encounter (Signed)
See prior telephone encounter.

## 2018-04-24 NOTE — Telephone Encounter (Signed)
Patient wants to know what Dr Rockey Situ thinks about and would recommend her do for low thyroid result from pcp.  Please call or email to discuss  risks vs benefits and how it and treatment would effect her cardiology wise.

## 2018-04-24 NOTE — Telephone Encounter (Signed)
Please let patient know that her labs look really good except her thyroid is underactive. We have 2 options- we can recheck it in 1 month and see if it goes back to normal, or start some thyroid medicine and recheck in 6 weeks. I'm happy to do either. Please let me know what she wants to do. Everything else looks great!

## 2018-04-24 NOTE — Telephone Encounter (Signed)
Patient called back she just wanted to review her labs and her PCP recommendations- reviewed the lab and the recommendations.Reassured that is PCP had felt strongly that she needed immediate medication- she would have prescribed now- she is more reassured and if she changes her mind- she will call back.

## 2018-04-24 NOTE — Telephone Encounter (Signed)
Called and spoke to patient. Notified of results, she states that she would like to have her thyroid rechecked in a month, lab visit scheduled for 05/22/18.

## 2018-04-25 ENCOUNTER — Telehealth: Payer: Self-pay | Admitting: *Deleted

## 2018-04-25 NOTE — Telephone Encounter (Signed)
PCP should handle this. This would not affect her from cancer standpoint which is in remission

## 2018-04-25 NOTE — Telephone Encounter (Signed)
Spoke with patient and she reports that her labs came back showing that she had abnormal thyroid panel. She wanted to see if there was any protocol that she needed to follow for this from a cardiology standpoint. Reviewed with her that her primary care provider should review treatment options with her in detail and to please let us know if she should have any further questions. She was appreciative for the call back.

## 2018-04-25 NOTE — Telephone Encounter (Signed)
Call returned to patient and she was advised of physician response

## 2018-04-25 NOTE — Telephone Encounter (Signed)
Patient called and states she saw her PCP and her thyroid is underactive. She si asking what she needs to do, should she contact her PCP and how will this affect her. Will Dr Janese Banks handle this? Please advise.

## 2018-05-03 ENCOUNTER — Ambulatory Visit (INDEPENDENT_AMBULATORY_CARE_PROVIDER_SITE_OTHER): Payer: BLUE CROSS/BLUE SHIELD

## 2018-05-03 ENCOUNTER — Ambulatory Visit (INDEPENDENT_AMBULATORY_CARE_PROVIDER_SITE_OTHER): Payer: BLUE CROSS/BLUE SHIELD | Admitting: Vascular Surgery

## 2018-05-03 ENCOUNTER — Encounter (INDEPENDENT_AMBULATORY_CARE_PROVIDER_SITE_OTHER): Payer: Self-pay | Admitting: Vascular Surgery

## 2018-05-03 VITALS — BP 101/62 | HR 60 | Resp 16 | Ht 63.0 in | Wt 145.6 lb

## 2018-05-03 DIAGNOSIS — I82592 Chronic embolism and thrombosis of other specified deep vein of left lower extremity: Secondary | ICD-10-CM | POA: Diagnosis not present

## 2018-05-03 DIAGNOSIS — C109 Malignant neoplasm of oropharynx, unspecified: Secondary | ICD-10-CM | POA: Diagnosis not present

## 2018-05-04 ENCOUNTER — Encounter (INDEPENDENT_AMBULATORY_CARE_PROVIDER_SITE_OTHER): Payer: Self-pay | Admitting: Vascular Surgery

## 2018-05-04 NOTE — Progress Notes (Signed)
Subjective:    Patient ID: Wendy Kelly, female    DOB: March 20, 1956, 62 y.o.   MRN: [id number] Chief Complaint  Patient presents with  . Follow-up    63month left dvt ultrasound   Presents for a 65-month DVT follow-up.  The patiently was recently taken off anticoagulation when she had her Port-A-Cath for squamous cell carcinoma taken out.  She did not restart her anticoagulation after the Port-A-Cath was removed.  She takes a daily aspirin.  The patient denies any left lower extremity pain.  The patient still notes some edema for which she engages in conservative therapy for including wearing medical grade 1 compression socks, and elevating her legs.  The patient denies any chest pain or shortness of breath.  The patient underwent a left lower extremity DVT study which was notable for findings consistent with chronic deep vein thrombosis involving the left femoral vein and left popliteal vein.  There is no evidence of superficial venous thrombosis.  DVT of the left tibial veins appear to have resolved.  The popliteal vein appears patient with nonocclusive chronic thrombus.  Patient denies any fever, nausea vomiting.  Review of Systems  Constitutional: Negative.   HENT: Negative.   Eyes: Negative.   Respiratory: Negative.   Cardiovascular:       DVT  Gastrointestinal: Negative.   Endocrine: Negative.   Genitourinary: Negative.   Musculoskeletal: Negative.   Skin: Negative.   Allergic/Immunologic: Negative.   Neurological: Negative.   Hematological: Negative.   Psychiatric/Behavioral: Negative.       Objective:   Physical Exam  Constitutional: She is oriented to person, place, and time. She appears well-developed and well-nourished. No distress.  HENT:  Head: Normocephalic and atraumatic.  Right Ear: External ear normal.  Left Ear: External ear normal.  Eyes: Pupils are equal, round, and reactive to light. Conjunctivae and EOM are normal.  Neck: Normal range of motion.    Cardiovascular: Normal rate, regular rhythm, normal heart sounds and intact distal pulses.  Pulses:      Radial pulses are 2+ on the right side, and 2+ on the left side.       Dorsalis pedis pulses are 2+ on the right side, and 2+ on the left side.       Posterior tibial pulses are 2+ on the right side, and 2+ on the left side.  Pulmonary/Chest: Effort normal and breath sounds normal.  Musculoskeletal: Normal range of motion. She exhibits edema (Mild edema to the left lower extremity.).  Neurological: She is alert and oriented to person, place, and time.  Skin: Skin is warm and dry. She is not diaphoretic.  Skin is intact.  There is mild edema.  There is no cellulitis.  Nontender to palpation.  No pain with dorsiflexion to the left lower extremity  Psychiatric: She has a normal mood and affect. Her behavior is normal. Judgment and thought content normal.  Vitals reviewed.  BP 101/62 (BP Location: Right Arm)   Pulse 60   Resp 16   Ht 5\' 3"  (1.6 m)   Wt 145 lb 9.6 oz (66 kg)   BMI 25.79 kg/m   Past Medical History:  Diagnosis Date  . AA (aortic aneurysm) ([place])    a. 04/2011 Echo: Ao Root: 4.1cm, Asc Ao 4.7cm.  . Anxiety   . AV block, 2nd degree    a. 05/2005 - s/p MDT Adapta ADDR01 Dual Chamber PPM  . Bicuspid aortic valve   . Cancer ([place])   . Chest  pain    a. 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  . Dehydration 08/11/2017  . Depression   . Expressive aphasia    a. ongoing since 04/2011 - seen by neurology - ? TIA vs. Migraine  . Facial numbness    a. ongoing since 04/2011 - seen by neurology - ? TIA vs. Migraine  . GERD (gastroesophageal reflux disease)   . Low blood pressure   . Presence of permanent cardiac pacemaker   . 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 Syncope    a. 04/2011 Echo: EF 55-65%, No RWMA, Gr 1 DD.   Social History   Socioeconomic History  . Marital status: Married    Spouse name: Not on file  . Number of children: Not on file  .  Years of education: Not on file  . Highest education level: Not on file  Occupational History  . Not on file  Social Needs  . Financial resource strain: Not very hard  . Food insecurity:    Worry: Never true    Inability: Never true  . Transportation needs:    Medical: No    Non-medical: No  Tobacco Use  . Smoking status: Never Smoker  . Smokeless tobacco: Never Used  . Tobacco comment: tobacco use -no  Substance and Sexual Activity  . Alcohol use: No  . Drug use: No  . Sexual activity: Yes    Birth control/protection: Surgical  Lifestyle  . Physical activity:    Days per week: 4 days    Minutes per session: 60 min  . Stress: Rather much  Relationships  . Social connections:    Talks on phone: More than three times a week    Gets together: Not on file    Attends religious service: More than 4 times per year    Active member of club or organization: Not on file    Attends meetings of clubs or organizations: Not on file    Relationship status: Married  . Intimate partner violence:    Fear of current or ex partner: No    Emotionally abused: No    Physically abused: No    Forced sexual activity: No  Other Topics Concern  . Not on file  Social History Narrative   Lives in [place] with husband.  Works out regularly.  Works as a Geophysicist/field seismologist - owns own business.    Past Surgical History:  Procedure Laterality Date  . ABDOMINAL WALL MESH  REMOVAL    . bladder tack    . cataract Bilateral 2004  . CHOLECYSTECTOMY  2000  . DILATION AND CURETTAGE OF UTERUS    . DIRECT LARYNGOSCOPY Right 06/16/2017   Procedure: MICRO DIRECT LARYNGOSCOPY WITH BIOPSY OF RIGHT BASE OF TONGUE;  Surgeon: Carloyn Manner, MD;  Location: [facility];  Service: ENT;  Laterality: Right;  . EP IMPLANTABLE DEVICE N/A 06/04/2015   Procedure: PPM Generator Changeout;  Surgeon: Deboraha Sprang, MD;  Location: [facility] [facility];  Service: Cardiovascular;  Laterality: N/A;  .  ESOPHAGOGASTRODUODENOSCOPY (EGD) WITH PROPOFOL N/A 12/18/2015   Procedure: ESOPHAGOGASTRODUODENOSCOPY (EGD) WITH gastric biopsy and dilation;  Surgeon: Lucilla Lame, MD;  Location: [facility];  Service: Endoscopy;  Laterality: N/A;  . EYE SURGERY    . INSERT / REPLACE / REMOVE PACEMAKER    . IR FLUORO GUIDE PORT INSERTION RIGHT  06/22/2017  . IR REMOVAL TUN ACCESS W/ PORT W/O FL MOD SED  04/14/2018  .  MOHS SURGERY  02/2018  . PACEMAKER INSERTION  2006   Medtronic Adapta ADDR01  . TONSILLECTOMY    . TONSILLECTOMY    . VAGINAL HYSTERECTOMY  2013   mad/cope   Family History  Problem Relation Age of Onset  . COPD Mother        alive @ 46  . Atrial fibrillation Mother   . Stroke Father        died @ 19  . Breast cancer Maternal Aunt 70  . COPD Maternal Grandmother   . Ovarian cancer Neg Hx   . Diabetes Neg Hx    Allergies  Allergen Reactions  . Cefdinir Swelling    Swelling in mouth   . Cephalosporins Swelling  . Dairy Aid [Lactase] Swelling    Milk and milk products cause swelling and tingling of tongue  . Flagyl [Metronidazole] Swelling    Mouth and throat and ears go red and burn  . Lactose Intolerance (Gi) Other (See Comments)    GI upset.  Eats only gluten free  . Penicillins Rash    Rash/hives  . Phenylephrine-Guaifenesin Anaphylaxis    Night terrors  . Sulfa Antibiotics Rash and Hives    Other reaction(s): Unknown   . Sulfonamide Derivatives Rash    Rash/itching.  From head to her toes, the itching was horrible. Penicillin is the worst  . Tape Rash    Adhesives all cause a problem severely.  It doesn't matter if it is paper tape or not.  Marland Kitchen Dexilant [Dexlansoprazole]     Pressure in head like head is in a vice and being squeezed  . Entex Lq [Phenylephrine-Guaifenesin]     Night terrors  . Other Other (See Comments)    Flu vaccine:  Weakness, dizziness, tia type symptoms.    . Oxycodone Nausea And Vomiting    And itching  . Succinylcholine Other (See  Comments)    Trouble waking up  . Vancomycin Rash  . Tetracycline Other (See Comments)    Unkown  . Codeine Nausea And Vomiting  . Guaifenesin & Derivatives Other (See Comments)    Night terrors  . Lactobacillus Itching  . Naproxen Sodium     Itching/rash  . Pseudoephedrine Rash  . Tetracyclines & Related Other (See Comments)    Was taking flagyl at the same time; unsure which caused the swelling  Swelling of throat. Pharmacist states that he believes it was the flagyl and not the tetracycline  . Wheat Bran Other (See Comments)    Upset stomach. Now eats gluten free      Assessment & Plan:  Presents for a 60-month DVT follow-up.  The patiently was recently taken off anticoagulation when she had her Port-A-Cath for squamous cell carcinoma taken out.  She did not restart her anticoagulation after the Port-A-Cath was removed.  She takes a daily aspirin.  The patient denies any left lower extremity pain.  The patient still notes some edema for which she engages in conservative therapy for including wearing medical grade 1 compression socks, and elevating her legs.  The patient denies any chest pain or shortness of breath.  The patient underwent a left lower extremity DVT study which was notable for findings consistent with chronic deep vein thrombosis involving the left femoral vein and left popliteal vein.  There is no evidence of superficial venous thrombosis.  DVT of the left tibial veins appear to have resolved.  The popliteal vein appears patient with nonocclusive chronic thrombus.  Patient denies any fever,  nausea vomiting.  1. 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 For the most part the patient did complete a 73-month course of oral anticoagulation for her left lower extremity DVT Recommend continued daily baby aspirin The patients ultrasound shows  resolvent of her DVT and nonocclusive chronic thrombus in the femoral and popliteal vein. Continue conservative therapy for symptomatic control I offered to bring the patient back in 3 months to reimage the left lower extremity however at this time the patient feels comfortable not having any additional duplex completed at this time I think this is reasonable because of her DVT remains stable and continues to resolve there will be no change in the plan The patient to call the office if she experiences any increased edema or pain to the left lower extremity Patient expresses her understanding  2. 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  Current Outpatient Medications on File Prior to Visit  Medication Sig Dispense Refill  . aspirin 81 MG tablet Take 81 mg by mouth daily.    Marland Kitchen b complex vitamins tablet Take 1 tablet by mouth daily.    Marland Kitchen BLACK ELDERBERRY,BERRY-FLOWER, PO Take 1 capsule by mouth daily.    Marland Kitchen CRANBERRY SOFT PO Take 2 each by mouth daily.     . Digestive Enzymes (DIGESTIVE ENZYME PO) Take 1-2 capsules by mouth daily.    . NON FORMULARY Take by mouth daily. Bone Up (calcium)     . PRESCRIPTION MEDICATION Place 1 tablet vaginally 3 (three) times a week. E3 1mg  and Testosterone 1mg  compound    . nitroGLYCERIN (NITROSTAT) 0.4 MG SL tablet Place 1 tablet (0.4 mg total) under the tongue every 5 (five) minutes as needed for chest pain. (Patient not taking: Reported on 03/31/2018) 25 tablet 3   Current Facility-Administered Medications on File Prior to Visit  Medication Dose Route Frequency Provider Last Rate Last Dose  . 0.9 %  sodium chloride infusion   Intravenous Once Sindy Guadeloupe, MD       There are no Patient Instructions on file for this visit. No follow-ups on file.  Laelia Angelo A Ziggy Chanthavong, PA-C

## 2018-05-22 ENCOUNTER — Other Ambulatory Visit: Payer: BLUE CROSS/BLUE SHIELD

## 2018-05-22 DIAGNOSIS — E039 Hypothyroidism, unspecified: Secondary | ICD-10-CM | POA: Diagnosis not present

## 2018-05-22 DIAGNOSIS — R946 Abnormal results of thyroid function studies: Secondary | ICD-10-CM | POA: Diagnosis not present

## 2018-05-22 DIAGNOSIS — R7989 Other specified abnormal findings of blood chemistry: Secondary | ICD-10-CM | POA: Diagnosis not present

## 2018-05-23 ENCOUNTER — Telehealth: Payer: Self-pay | Admitting: Family Medicine

## 2018-05-23 DIAGNOSIS — E039 Hypothyroidism, unspecified: Secondary | ICD-10-CM

## 2018-05-23 LAB — TSH: TSH: 16.31 u[IU]/mL — AB (ref 0.450–4.500)

## 2018-05-23 MED ORDER — LEVOTHYROXINE SODIUM 25 MCG PO TABS: 25.0000 ug | ORAL_TABLET | Freq: Every day | ORAL | 1 refills | Status: DC

## 2018-05-23 NOTE — Telephone Encounter (Signed)
Called and left patient a VM (signed DPR) letting her know what Dr. Wynetta Emery said. Asked for patient to please call us back to let us know if she would like to start medication.

## 2018-05-23 NOTE — Telephone Encounter (Signed)
Please let her know that her thyroid got slightly better, but still not good at 67- I'd like to start her on some thyroid medicine if she is willing to do it.

## 2018-05-23 NOTE — Telephone Encounter (Signed)
Patient returned my call. She states that she has questions and requests to speak to Dr. Wynetta Emery. Explained to patient that Dr. Wynetta Emery was currently seeing patients but that I would route this message back to her so she could call when she has a chance.

## 2018-05-23 NOTE — Telephone Encounter (Signed)
Called and spoke to [place] about her results. Questions answered. Call with any concerns. Will start synthroid and recheck levels in 6 weeks.

## 2018-05-24 ENCOUNTER — Ambulatory Visit
Admission: RE | Admit: 2018-05-24 | Discharge: 2018-05-24 | Disposition: A | Discharge status: Home / Self Care | Payer: BLUE CROSS/BLUE SHIELD | Source: Ambulatory Visit | Attending: Family Medicine | Admitting: Family Medicine

## 2018-05-24 DIAGNOSIS — Z1239 Encounter for other screening for malignant neoplasm of breast: Secondary | ICD-10-CM | POA: Insufficient documentation

## 2018-05-24 DIAGNOSIS — Z1231 Encounter for screening mammogram for malignant neoplasm of breast: Secondary | ICD-10-CM | POA: Diagnosis not present

## 2018-05-24 HISTORY — DX: Personal history of irradiation: Z92.3

## 2018-05-24 HISTORY — DX: Personal history of antineoplastic chemotherapy: Z92.21

## 2018-06-06 DIAGNOSIS — H9201 Otalgia, right ear: Secondary | ICD-10-CM | POA: Diagnosis not present

## 2018-06-06 DIAGNOSIS — R682 Dry mouth, unspecified: Secondary | ICD-10-CM | POA: Diagnosis not present

## 2018-06-06 DIAGNOSIS — Z85818 Personal history of malignant neoplasm of other sites of lip, oral cavity, and pharynx: Secondary | ICD-10-CM | POA: Diagnosis not present

## 2018-06-13 ENCOUNTER — Encounter: Payer: Self-pay | Admitting: Internal Medicine

## 2018-06-13 ENCOUNTER — Ambulatory Visit (INDEPENDENT_AMBULATORY_CARE_PROVIDER_SITE_OTHER): Payer: BLUE CROSS/BLUE SHIELD | Admitting: Internal Medicine

## 2018-06-13 VITALS — BP 80/60 | HR 65 | Ht 63.0 in | Wt 140.0 lb

## 2018-06-13 DIAGNOSIS — Z950 Presence of cardiac pacemaker: Secondary | ICD-10-CM

## 2018-06-13 DIAGNOSIS — I441 Atrioventricular block, second degree: Secondary | ICD-10-CM

## 2018-06-13 DIAGNOSIS — I480 Paroxysmal atrial fibrillation: Secondary | ICD-10-CM

## 2018-06-13 DIAGNOSIS — R55 Syncope and collapse: Secondary | ICD-10-CM | POA: Diagnosis not present

## 2018-06-13 NOTE — Progress Notes (Signed)
skf      Patient Care Team: Valerie Roys, DO as PCP - General (Family Medicine) Rockey Situ Kathlene November, MD as Consulting Physician (Cardiology) Sindy Guadeloupe, MD as Consulting Physician (Oncology) Carloyn Manner, MD as Referring Physician (Otolaryngology) Noreene Filbert, MD as Referring Physician (Radiation Oncology)   HPI  Wendy Kelly is a 62 y.o. female Seen in followup for a pacemaker implanted for syncope with intermittent second degree heart block. She received a Medtronic adapta device in 2006.    Her device reverted to ERI 10/16 and she underwent generator replacement 11/16.      Hx of TIA,  Orthostatic intolerance   Date  Aorta  12/15 CTA 4.6  6/18 CTA 4.8  5/19 PET 4.3           She has intercurrently been seen by Dr. Ron Agee.  He will plan to see her again in June 2020 for repeat scan  Cancer therapy was complicated by DVT for which she took anticoagulation for 6 months.  Chemotherapy including radiation and chemotherapy.  Without chest pain or shortness of breath.  Denies lightheadedness   DATE TEST EF   12/15 echo  50-55  mod ascending aortic dilitation 36 mm  1/16 myoview   EF 40-"normal"  no ischemia  1/18 Echo  55-60               nancy of throat area-- evaluation on going  Overwhelmed   Past Medical History:  Diagnosis Date  . AA (aortic aneurysm) ([place])    a. 04/2011 Echo: Ao Root: 4.1cm, Asc Ao 4.7cm.  . Anxiety   . AV block, 2nd degree    a. 05/2005 - s/p MDT Adapta ADDR01 Dual Chamber PPM  . Bicuspid aortic valve   . Cancer ([place])    Throat  . Chest pain    a. 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  . Dehydration 08/11/2017  . Depression   . Expressive aphasia    a. ongoing since 04/2011 - seen by neurology - ? TIA vs. Migraine  . Facial numbness    a. ongoing since 04/2011 - seen by neurology - ? TIA vs. Migraine  . GERD (gastroesophageal reflux disease)   . Low blood pressure   . Personal history of chemotherapy 2018-2019   Throat  . Personal  history of radiation therapy 2018-2019   Throat  . Presence of permanent cardiac pacemaker   . 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 Syncope    a. 04/2011 Echo: EF 55-65%, No RWMA, Gr 1 DD.    Past Surgical History:  Procedure Laterality Date  . ABDOMINAL WALL MESH  REMOVAL    . bladder tack    . cataract Bilateral 2004  . CHOLECYSTECTOMY  2000  . DILATION AND CURETTAGE OF UTERUS    . DIRECT LARYNGOSCOPY Right 06/16/2017   Procedure: MICRO DIRECT LARYNGOSCOPY WITH BIOPSY OF RIGHT BASE OF TONGUE;  Surgeon: Carloyn Manner, MD;  Location: [facility];  Service: ENT;  Laterality: Right;  . EP IMPLANTABLE DEVICE N/A 06/04/2015   Procedure: PPM Generator Changeout;  Surgeon: Deboraha Sprang, MD;  Location: [place] [facility];  Service: Cardiovascular;  Laterality: N/A;  . ESOPHAGOGASTRODUODENOSCOPY (EGD) WITH PROPOFOL N/A 12/18/2015   Procedure: ESOPHAGOGASTRODUODENOSCOPY (EGD) WITH gastric biopsy and dilation;  Surgeon: Lucilla Lame, MD;  Location: [place];  Service: Endoscopy;  Laterality: N/A;  . EYE SURGERY    . INSERT / REPLACE / REMOVE PACEMAKER    .  IR FLUORO GUIDE PORT INSERTION RIGHT  06/22/2017  . IR REMOVAL TUN ACCESS W/ PORT W/O FL MOD SED  04/14/2018  . MOHS SURGERY  02/2018  . PACEMAKER INSERTION  2006   Medtronic Adapta ADDR01  . TONSILLECTOMY    . TONSILLECTOMY    . VAGINAL HYSTERECTOMY  2013   mad/cope    Current Outpatient Medications  Medication Sig Dispense Refill  . aspirin 81 MG tablet Take 81 mg by mouth daily.    Marland Kitchen b complex vitamins tablet Take 1 tablet by mouth daily.    Marland Kitchen BLACK ELDERBERRY,BERRY-FLOWER, PO Take 1 capsule by mouth daily.    Marland Kitchen CRANBERRY SOFT PO Take 2 each by mouth daily.     . Digestive Enzymes (DIGESTIVE ENZYME PO) Take 1-2 capsules by mouth daily.    Marland Kitchen levothyroxine (SYNTHROID) 25 MCG tablet Take 1 tablet (25 mcg total) by mouth daily before breakfast. BRAND NAME NECESSARY 30 tablet 1  . nitroGLYCERIN  (NITROSTAT) 0.4 MG SL tablet Place 1 tablet (0.4 mg total) under the tongue every 5 (five) minutes as needed for chest pain. 25 tablet 3  . NON FORMULARY Take by mouth daily. Bone Up (calcium)     . PRESCRIPTION MEDICATION Place 1 tablet vaginally 3 (three) times a week. E3 1mg  and Testosterone 1mg  compound     No current facility-administered medications for this visit.    Facility-Administered Medications Ordered in Other Visits  Medication Dose Route Frequency Provider Last Rate Last Dose  . 0.9 %  sodium chloride infusion   Intravenous Once Sindy Guadeloupe, MD        Allergies  Allergen Reactions  . Cefdinir Swelling    Swelling in mouth   . Cephalosporins Swelling  . Dairy Aid [Lactase] Swelling    Milk and milk products cause swelling and tingling of tongue  . Flagyl [Metronidazole] Swelling    Mouth and throat and ears go red and burn  . Lactose Intolerance (Gi) Other (See Comments)    GI upset.  Eats only gluten free  . Penicillins Rash    Rash/hives  . Phenylephrine-Guaifenesin Anaphylaxis    Night terrors  . Sulfa Antibiotics Rash and Hives    Other reaction(s): Unknown   . Sulfonamide Derivatives Rash    Rash/itching.  From head to her toes, the itching was horrible. Penicillin is the worst  . Tape Rash    Adhesives all cause a problem severely.  It doesn't matter if it is paper tape or not.  Marland Kitchen Dexilant [Dexlansoprazole]     Pressure in head like head is in a vice and being squeezed  . Entex Lq [Phenylephrine-Guaifenesin]     Night terrors  . Other Other (See Comments)    Flu vaccine:  Weakness, dizziness, tia type symptoms.    . Oxycodone Nausea And Vomiting    And itching  . Succinylcholine Other (See Comments)    Trouble waking up  . Vancomycin Rash  . Tetracycline Other (See Comments)    Unkown  . Codeine Nausea And Vomiting  . Guaifenesin & Derivatives Other (See Comments)    Night terrors  . Lactobacillus Itching  . Naproxen Sodium     Itching/rash   . Pseudoephedrine Rash  . Tetracyclines & Related Other (See Comments)    Was taking flagyl at the same time; unsure which caused the swelling  Swelling of throat. Pharmacist states that he believes it was the flagyl and not the tetracycline  . Wheat Bran Other (See  Comments)    Upset stomach. Now eats gluten free    Review of Systems negative except from HPI and PMH  Physical Exam BP (!) 80/60 (BP Location: Left Arm, Patient Position: Sitting, Cuff Size: Normal)   Pulse 65   Ht 5\' 3"  (1.6 m)   Wt 140 lb (63.5 kg)   BMI 24.80 kg/m  Well developed and nourished in no acute distress HENT normal Neck supple with JVP-flat Clear Regular rate and rhythm, no murmurs or gallops Abd-soft with active BS No Clubbing cyanosis edema Skin-warm and dry A & Oriented  Grossly normal sensory and motor function    ECG sinus rhythm at 65 minutes interval 16/09/40  Assessment and  Plan  Syncope  Intermittent second degree heart block   Pacemaker-Medtronic  The patient's device was interrogated.  The information was reviewed. No changes were made in the programming.      Ascending aortic aneurysm positive family history  TIA    Cancer S/P CHEMO XRT   Device function is normal  Notes reviewed regarding thoracic aneurysm.  Her son has been identified with an aneurysm.  Her daughter has not been evaluated.  Recommended that she undergo evaluation.  Genetic testing might be preferred strategy.  She will see Dr. PVT in 6 months and will get his input.  She has 1 sibling-sudden death.  We spent more than 50% of our >25 min visit in face to face counseling regarding the above

## 2018-06-13 NOTE — Patient Instructions (Signed)
Medication Instructions:  - Your physician recommends that you continue on your current medications as directed. Please refer to the Current Medication list given to you today.  If you need a refill on your cardiac medications before your next appointment, please call your pharmacy.   Lab work: - none ordered  If you have labs (blood work) drawn today and your tests are completely normal, you will receive your results only by: Marland Kitchen MyChart Message (if you have MyChart) OR . A paper copy in the mail If you have any lab test that is abnormal or we need to change your treatment, we will call you to review the results.  Testing/Procedures: - none ordered  Follow-Up: At [facility], you and your health needs are our priority.  As part of our continuing mission to provide you with exceptional heart care, we have created designated Provider Care Teams.  These Care Teams include your primary Cardiologist (physician) and Advanced Practice Providers (APPs -  Physician Assistants and Nurse Practitioners) who all work together to provide you with the care you need, when you need it. . You will need a follow up appointment in 1 year with Dr. Caryl Comes.  Please call our office 2 months in advance to schedule this appointment.    Remote monitoring is used to monitor your Pacemaker of ICD from home. This monitoring reduces the number of office visits required to check your device to one time per year. It allows [place] to keep an eye on the functioning of your device to ensure it is working properly. You are scheduled for a device check from home on 09/12/18. You may send your transmission at any time that day. If you have a wireless device, the transmission will be sent automatically. After your physician reviews your transmission, you will receive a postcard with your next transmission date.  Any Other Special Instructions Will Be Listed Below (If Applicable). - N/A

## 2018-06-20 ENCOUNTER — Ambulatory Visit: Payer: Self-pay | Admitting: *Deleted

## 2018-06-20 NOTE — Telephone Encounter (Signed)
Patient states she has a rash on her belly for about a week and would like to know if it could be coming from Synthroid.   Patient has been on Synthroid for about 1 month- she has had outbreak on her arms and abdomen. She is itching really badly. 3 weeks- after starting she had rash on L arm and then the R arm. The rash on abdomin appeared yesterday. Patient is using hydrocortisone cream for the itching. Offered patient appointment tomorrow- but due to her work schedule- she scheduled for Thursday morning.  Reason for Disposition . Hives or itching  Answer Assessment - Initial Assessment Questions 1. APPEARANCE of RASH: "Describe the rash." (e.g., spots, blisters, raised areas, skin peeling, scaly)     Red bumps 2. SIZE: "How big are the spots?" (e.g., tip of pen, eraser, coin; inches, centimeters)     Pin size 3. LOCATION: "Where is the rash located?"     L and R arm and starting on abdomin  4. COLOR: "What color is the rash?" (Note: It is difficult to assess rash color in people with darker-colored skin. When this situation occurs, simply ask the caller to describe what they see.)     red 5. ONSET: "When did the rash begin?"     Last week 6. FEVER: "Do you have a fever?" If so, ask: "What is your temperature, how was it measured, and when did it start?"     no 7. ITCHING: "Does the rash itch?" If so, ask: "How bad is the itch?" (Scale 1-10; or mild, moderate, severe)     Yes- moderate/severe 8. CAUSE: "What do you think is causing the rash?"     unsure 9. NEW MEDICATION: "What new medication are you taking?" (e.g., name of antibiotic) "When did you start taking this medication?".     Synthroid- 1 month 10. OTHER SYMPTOMS: "Do you have any other symptoms?" (e.g., sore throat, fever, joint pain)       no 11. PREGNANCY: "Is there any chance you are pregnant?" "When was your last menstrual period?"       n/a  Protocols used: RASH - WIDESPREAD ON DRUGS-A-AH

## 2018-06-21 NOTE — Telephone Encounter (Signed)
Message relayed to patient. Verbalized understanding and denied questions.  Has appointment to see Mrs. Orene Desanctis, PA-C on 12/17

## 2018-06-21 NOTE — Telephone Encounter (Signed)
It's unlikely the medicine made her break out after 3 weeks- but she can stop it until I see her and we'll see if it starts getting better

## 2018-06-22 ENCOUNTER — Ambulatory Visit: Payer: Self-pay | Admitting: Family Medicine

## 2018-06-23 ENCOUNTER — Other Ambulatory Visit: Payer: BLUE CROSS/BLUE SHIELD

## 2018-06-23 DIAGNOSIS — R7989 Other specified abnormal findings of blood chemistry: Secondary | ICD-10-CM

## 2018-06-23 DIAGNOSIS — E039 Hypothyroidism, unspecified: Secondary | ICD-10-CM

## 2018-06-23 DIAGNOSIS — R946 Abnormal results of thyroid function studies: Secondary | ICD-10-CM | POA: Diagnosis not present

## 2018-06-24 LAB — THYROID PANEL WITH TSH
Free Thyroxine Index: 1.2 (ref 1.2–4.9)
T3 Uptake Ratio: 24 % (ref 24–39)
T4, Total: 4.9 ug/dL (ref 4.5–12.0)
TSH: 11.49 u[IU]/mL — ABNORMAL HIGH (ref 0.450–4.500)

## 2018-06-26 ENCOUNTER — Telehealth: Payer: Self-pay | Admitting: Family Medicine

## 2018-06-26 NOTE — Telephone Encounter (Signed)
Please let Wendy Kelly know that her thyroid is better, but still not normal- she's at 11, down from 16 and I'd like to keep her between 1-2. I know there has been issues with her medicine and she's seeing Apolonio Schneiders tomorrow. We'll discuss this at her appointment.

## 2018-06-26 NOTE — Telephone Encounter (Signed)
Patient returned my call. Dr. Wynetta Emery message relayed. Pt verbalized understanding.

## 2018-06-26 NOTE — Telephone Encounter (Signed)
Tried to call patient, no answer. Left VM for patient  to call us back to the office.

## 2018-06-26 NOTE — Telephone Encounter (Signed)
You're seeing her tomorrow- thyroid was 15, thought she was having a rash with it, coming in for rash tomorrow, need to see how long she was off the medicine and if she will still take it, if she will will likely increase to 45mcg and recheck 6 weeks. FYI

## 2018-06-27 ENCOUNTER — Ambulatory Visit (INDEPENDENT_AMBULATORY_CARE_PROVIDER_SITE_OTHER): Payer: BLUE CROSS/BLUE SHIELD | Admitting: Family Medicine

## 2018-06-27 ENCOUNTER — Encounter: Payer: Self-pay | Admitting: Family Medicine

## 2018-06-27 ENCOUNTER — Other Ambulatory Visit: Payer: Self-pay

## 2018-06-27 VITALS — BP 105/73 | HR 70 | Temp 98.3°F | Ht 63.0 in | Wt 139.0 lb

## 2018-06-27 DIAGNOSIS — E039 Hypothyroidism, unspecified: Secondary | ICD-10-CM | POA: Insufficient documentation

## 2018-06-27 DIAGNOSIS — R21 Rash and other nonspecific skin eruption: Secondary | ICD-10-CM | POA: Diagnosis not present

## 2018-06-27 MED ORDER — TRIAMCINOLONE ACETONIDE 0.1 % EX CREA: 1.0000 "application " | TOPICAL_CREAM | Freq: Two times a day (BID) | CUTANEOUS | 0 refills | Status: DC

## 2018-06-27 NOTE — Progress Notes (Signed)
BP 105/73   Pulse 70   Temp 98.3 F (36.8 C) (Oral)   Ht 5\' 3"  (1.6 m)   Wt 139 lb (63 kg)   SpO2 100%   BMI 24.62 kg/m    Subjective:    Patient ID: Wendy Kelly, female    DOB: February 10, 1956, 62 y.o.   MRN: [id number]  HPI: Wendy Kelly is a 62 y.o. female  Chief Complaint  Patient presents with  . Rash    pt stats she has being itching arms and breast and abdomen for about 2 weeks ago  . Hypothyroidism   Here today for 2 weeks of itchy rash across breasts, torso, and b/l arms. Sometimes also has itching up neck and onto face. Taking antihistamines (claritin 12 hour in morning, allegra at night) which help temporarily - taking hydrocortisone cream which seems to mildly help. Denies itchy, swollen throat, wheezing, chest tightness, SOB. Only change to routine/products is she started on synthroid 3 weeks before onset. Has been off the medication for 1 week now with no identifiable improvement.   Past Medical History:  Diagnosis Date  . AA (aortic aneurysm) ([place])    a. 04/2011 Echo: Ao Root: 4.1cm, Asc Ao 4.7cm.  . Anxiety   . AV block, 2nd degree    a. 05/2005 - s/p MDT Adapta ADDR01 Dual Chamber PPM  . Bicuspid aortic valve   . Cancer ([place])    Throat  . Chest pain    a. 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  . Dehydration 08/11/2017  . Depression   . Expressive aphasia    a. ongoing since 04/2011 - seen by neurology - ? TIA vs. Migraine  . Facial numbness    a. ongoing since 04/2011 - seen by neurology - ? TIA vs. Migraine  . GERD (gastroesophageal reflux disease)   . Low blood pressure   . Personal history of chemotherapy 2018-2019   Throat  . Personal history of radiation therapy 2018-2019   Throat  . Presence of permanent cardiac pacemaker   . 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 Syncope    a. 04/2011 Echo: EF 55-65%, No RWMA, Gr 1 DD.   Social History   Socioeconomic History  . Marital status: Married    Spouse name: Not on file  .  Number of children: Not on file  . Years of education: Not on file  . Highest education level: Not on file  Occupational History  . Not on file  Social Needs  . Financial resource strain: Not very hard  . Food insecurity:    Worry: Never true    Inability: Never true  . Transportation needs:    Medical: No    Non-medical: No  Tobacco Use  . Smoking status: Never Smoker  . Smokeless tobacco: Never Used  . Tobacco comment: tobacco use -no  Substance and Sexual Activity  . Alcohol use: No  . Drug use: No  . Sexual activity: Yes    Birth control/protection: Surgical  Lifestyle  . Physical activity:    Days per week: 4 days    Minutes per session: 60 min  . Stress: Rather much  Relationships  . Social connections:    Talks on phone: More than three times a week    Gets together: Not on file    Attends religious service: More than 4 times per year    Active member of club or organization: Not on file  Attends meetings of clubs or organizations: Not on file    Relationship status: Married  . Intimate partner violence:    Fear of current or ex partner: No    Emotionally abused: No    Physically abused: No    Forced sexual activity: No  Other Topics Concern  . Not on file  Social History Narrative   Lives in [place] with husband.  Works out regularly.  Works as a Geophysicist/field seismologist - owns own business.     Relevant past medical, surgical, family and social history reviewed and updated as indicated. Interim medical history since our last visit reviewed. Allergies and medications reviewed and updated.  Review of Systems  Per HPI unless specifically indicated above     Objective:    BP 105/73   Pulse 70   Temp 98.3 F (36.8 C) (Oral)   Ht 5\' 3"  (1.6 m)   Wt 139 lb (63 kg)   SpO2 100%   BMI 24.62 kg/m   Wt Readings from Last 3 Encounters:  06/27/18 139 lb (63 kg)  06/13/18 140 lb (63.5 kg)  05/03/18 145 lb 9.6 oz (66 kg)    Physical Exam Vitals signs  and nursing note reviewed.  Constitutional:      Appearance: Normal appearance.  HENT:     Head: Atraumatic.     Mouth/Throat:     Mouth: Mucous membranes are moist.     Pharynx: Oropharynx is clear.  Neck:     Musculoskeletal: Normal range of motion and neck supple.  Cardiovascular:     Rate and Rhythm: Normal rate and regular rhythm.  Pulmonary:     Effort: Pulmonary effort is normal.     Breath sounds: Normal breath sounds.  Musculoskeletal: Normal range of motion.  Skin:    General: Skin is warm and dry.     Findings: Rash (urticarial rash diffusely across b/l UEs and along breasts and abdomen) present.  Neurological:     Mental Status: She is alert and oriented to person, place, and time.  Psychiatric:     Comments: Tearful, anxious     Results for orders placed or performed in visit on 06/23/18  Thyroid Panel With TSH  Result Value Ref Range   TSH 11.490 (H) 0.450 - 4.500 uIU/mL   T4, Total 4.9 4.5 - 12.0 ug/dL   T3 Uptake Ratio 24 24 - 39 %   Free Thyroxine Index 1.2 1.2 - 4.9      Assessment & Plan:   Problem List Items Addressed This Visit      Endocrine   Hypothyroidism    Continue temporary hold on synthroid until rash has cleared. Will discuss at that time restarting/increasing synthroid dose as TSH still not under good control. Unlikely that rash is from synthroid given timeline but will monitor closely if and when restarted       Other Visit Diagnoses    Rash    -  Primary   Pt declines prednisone or IM kenalog as had tolerance issues in the past. Cont antihistamines, triamcinolone topical sent for better control       Follow up plan: Return in about 2 weeks (around 07/11/2018) for Thyroid, rash f/u.

## 2018-06-27 NOTE — Assessment & Plan Note (Signed)
Continue temporary hold on synthroid until rash has cleared. Will discuss at that time restarting/increasing synthroid dose as TSH still not under good control. Unlikely that rash is from synthroid given timeline but will monitor closely if and when restarted

## 2018-07-03 ENCOUNTER — Telehealth: Payer: Self-pay | Admitting: Family Medicine

## 2018-07-03 NOTE — Telephone Encounter (Signed)
Ok to restart, call if rash returns

## 2018-07-03 NOTE — Telephone Encounter (Signed)
Copied from [place] [id number]. Topic: Quick Communication - See Telephone Encounter >> Jul 03, 2018  8:33 AM Margot Ables wrote: CRM for notification. See Telephone encounter for: 07/03/18.  Pt called stating her rash has gone and she is wondering if she can restart synthroid that she was advised to discontinue until her OV 07/10/18. Pt states that she is starting to feel fatigued again. Please advise.

## 2018-07-03 NOTE — Telephone Encounter (Signed)
Called and left patient a VM (signed DPR) letting her know what Apolonio Schneiders said. Asked for patient to call back with any questions or concerns.

## 2018-07-10 ENCOUNTER — Encounter: Payer: Self-pay | Admitting: Family Medicine

## 2018-07-10 ENCOUNTER — Telehealth: Payer: Self-pay | Admitting: Family Medicine

## 2018-07-10 ENCOUNTER — Ambulatory Visit (INDEPENDENT_AMBULATORY_CARE_PROVIDER_SITE_OTHER): Payer: BLUE CROSS/BLUE SHIELD | Admitting: Family Medicine

## 2018-07-10 VITALS — BP 86/55 | HR 63 | Temp 98.5°F | Ht 63.0 in | Wt 141.5 lb

## 2018-07-10 DIAGNOSIS — L509 Urticaria, unspecified: Secondary | ICD-10-CM | POA: Diagnosis not present

## 2018-07-10 DIAGNOSIS — E039 Hypothyroidism, unspecified: Secondary | ICD-10-CM | POA: Diagnosis not present

## 2018-07-10 MED ORDER — LEVOTHYROXINE SODIUM 50 MCG PO TABS: 50.0000 ug | ORAL_TABLET | Freq: Every day | ORAL | 1 refills | Status: DC

## 2018-07-10 NOTE — Telephone Encounter (Signed)
Patient is requesting her prescription medication the compound be sent to [facility] if this has not been sent.  Thank you

## 2018-07-10 NOTE — Progress Notes (Signed)
BP (!) 86/55 (BP Location: Left Arm, Patient Position: Sitting, Cuff Size: Normal)   Pulse 63   Temp 98.5 F (36.9 C) (Oral)   Ht 5\' 3"  (1.6 m)   Wt 141 lb 8 oz (64.2 kg)   SpO2 98%   BMI 25.07 kg/m    Subjective:    Patient ID: Wendy Kelly, female    DOB: 01/15/1956, 62 y.o.   MRN: [id number]  HPI: Wendy Kelly is a 62 y.o. female  Chief Complaint  Patient presents with  . Hypothyroidism    2 week follow-up  . Rash    Patient states rash is better  . Headache   Here today for 2 week follow up after significant episode of diffuse urticaria with unknown trigger. Synthroid was held for a week after onset to ensure this was not the cause. Rash has since cleared and synthroid restarted last week at 25 mcg. Tolerating well. Last TSH still significantly elevated prior to d/c. Still feeling significantly fatigued.   Relevant past medical, surgical, family and social history reviewed and updated as indicated. Interim medical history since our last visit reviewed. Allergies and medications reviewed and updated.  Review of Systems  Per HPI unless specifically indicated above     Objective:    BP (!) 86/55 (BP Location: Left Arm, Patient Position: Sitting, Cuff Size: Normal)   Pulse 63   Temp 98.5 F (36.9 C) (Oral)   Ht 5\' 3"  (1.6 m)   Wt 141 lb 8 oz (64.2 kg)   SpO2 98%   BMI 25.07 kg/m   Wt Readings from Last 3 Encounters:  07/10/18 141 lb 8 oz (64.2 kg)  06/27/18 139 lb (63 kg)  06/13/18 140 lb (63.5 kg)    Physical Exam Vitals signs and nursing note reviewed.  Constitutional:      Appearance: She is well-developed.  HENT:     Head: Atraumatic.  Eyes:     Extraocular Movements: Extraocular movements intact.     Conjunctiva/sclera: Conjunctivae normal.  Neck:     Musculoskeletal: Normal range of motion and neck supple.  Cardiovascular:     Rate and Rhythm: Normal rate and regular rhythm.  Pulmonary:     Effort: Pulmonary effort is normal.     Breath  sounds: Normal breath sounds.  Musculoskeletal: Normal range of motion.  Skin:    General: Skin is warm and dry.  Neurological:     General: No focal deficit present.     Mental Status: She is oriented to person, place, and time.  Psychiatric:        Thought Content: Thought content normal.        Judgment: Judgment normal.     Results for orders placed or performed in visit on 06/23/18  Thyroid Panel With TSH  Result Value Ref Range   TSH 11.490 (H) 0.450 - 4.500 uIU/mL   T4, Total 4.9 4.5 - 12.0 ug/dL   T3 Uptake Ratio 24 24 - 39 %   Free Thyroxine Index 1.2 1.2 - 4.9      Assessment & Plan:   Problem List Items Addressed This Visit      Endocrine   Hypothyroidism - Primary    Increase synthroid to 50 mcg, follow up in 1 month for lab visit recheck. Follow up in 2 months in clinic      Relevant Medications   levothyroxine (SYNTHROID, LEVOTHROID) 50 MCG tablet    Other Visit Diagnoses    Urticaria  Rash resolved, intermittent itching. Continue antihistamine regimen. F/u if rash returns       Follow up plan: Return in about 2 months (around 09/09/2018) for Thyroid.

## 2018-07-10 NOTE — Telephone Encounter (Signed)
Place 1 tablet vaginally 3 (three) times a week. E3 1mg  and Testosterone 1mg  compound

## 2018-07-10 NOTE — Telephone Encounter (Signed)
What medication

## 2018-07-11 NOTE — Telephone Encounter (Signed)
Rx written and faxed to [facility]

## 2018-07-12 NOTE — Assessment & Plan Note (Signed)
Increase synthroid to 50 mcg, follow up in 1 month for lab visit recheck. Follow up in 2 months in clinic

## 2018-07-24 ENCOUNTER — Encounter: Payer: Self-pay | Admitting: Radiation Oncology

## 2018-07-24 ENCOUNTER — Other Ambulatory Visit: Payer: Self-pay

## 2018-07-24 ENCOUNTER — Ambulatory Visit
Admission: RE | Admit: 2018-07-24 | Discharge: 2018-07-24 | Disposition: A | Discharge status: Home / Self Care | Payer: BLUE CROSS/BLUE SHIELD | Source: Ambulatory Visit | Attending: Radiation Oncology | Admitting: Radiation Oncology

## 2018-07-24 VITALS — BP 109/76 | HR 72 | Temp 96.1°F | Resp 16 | Wt 143.8 lb

## 2018-07-24 DIAGNOSIS — Z923 Personal history of irradiation: Secondary | ICD-10-CM | POA: Diagnosis not present

## 2018-07-24 DIAGNOSIS — K117 Disturbances of salivary secretion: Secondary | ICD-10-CM | POA: Diagnosis not present

## 2018-07-24 DIAGNOSIS — R1310 Dysphagia, unspecified: Secondary | ICD-10-CM | POA: Diagnosis not present

## 2018-07-24 DIAGNOSIS — I714 Abdominal aortic aneurysm, without rupture: Secondary | ICD-10-CM | POA: Insufficient documentation

## 2018-07-24 DIAGNOSIS — Z85810 Personal history of malignant neoplasm of tongue: Secondary | ICD-10-CM | POA: Diagnosis not present

## 2018-07-24 DIAGNOSIS — Z9221 Personal history of antineoplastic chemotherapy: Secondary | ICD-10-CM | POA: Insufficient documentation

## 2018-07-24 DIAGNOSIS — C760 Malignant neoplasm of head, face and neck: Secondary | ICD-10-CM

## 2018-07-24 NOTE — Progress Notes (Signed)
Radiation Oncology Follow up Note  Name: Wendy Kelly   Date:   07/24/2018 MRN:  [id number] DOB: Jan 27, 1956    This 63 y.o. 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 Does have some slight dysphasia to bread and meat..last PET/CT was 5 removed back in May shows complete resolution of previous hypermetabolic right level II adenopathy. She does have an ascending aortic aneurysm 4.3 cm which is being followed.  COMPLICATIONS OF TREATMENT: none  FOLLOW UP COMPLIANCE: keeps appointments   PHYSICAL EXAM:  BP 109/76 (BP Location: Left Arm, Patient Position: Sitting)   Pulse 72   Temp (!) 96.1 F (35.6 C) (Tympanic)   Resp 16   Wt 143 lb 13.6 oz (65.2 kg)   BMI 25.48 kg/m  Oral cavity is clear no oral mucosal lesions are identified. Indirect mirror examination shows upper airway clear vallecula and base of tongue within normal limits. Neck is clear without evidence of cervical or supraclavicular adenopathy.Well-developed well-nourished patient in NAD. HEENT reveals PERLA, EOMI, discs not visualized.  Oral cavity is clear. No oral mucosal lesions are identified. Neck is clear without evidence of cervical or supraclavicular adenopathy. Lungs are clear to A&P. Cardiac examination is essentially unremarkable with regular rate and rhythm without murmur rub or thrill. Abdomen is benign with no organomegaly or masses noted. Motor sensory and DTR levels are equal and symmetric in the upper and lower extremities. Cranial  nerves II through XII are grossly intact. Proprioception is intact. No peripheral adenopathy or edema is identified. No motor or sensory levels are noted. Crude visual fields are within normal range.  RADIOLOGY RESULTS: PET CT scan is reviewed and compatible with the above-stated findings  PLAN: present time she continues to do well with no evidence of disease. I'm please were overall progress. I've asked to see her back in 1 year for follow-up. She continues close follow-up care with ENT. Patient is to call with any concerns at any time.  I would like to take this opportunity to thank you for allowing me to participate in the care of your patient.Noreene Filbert, MD

## 2018-08-07 DIAGNOSIS — H9201 Otalgia, right ear: Secondary | ICD-10-CM | POA: Diagnosis not present

## 2018-08-07 DIAGNOSIS — Z85818 Personal history of malignant neoplasm of other sites of lip, oral cavity, and pharynx: Secondary | ICD-10-CM | POA: Diagnosis not present

## 2018-08-07 DIAGNOSIS — R682 Dry mouth, unspecified: Secondary | ICD-10-CM | POA: Diagnosis not present

## 2018-08-09 ENCOUNTER — Other Ambulatory Visit: Payer: BLUE CROSS/BLUE SHIELD

## 2018-08-09 DIAGNOSIS — E039 Hypothyroidism, unspecified: Secondary | ICD-10-CM | POA: Diagnosis not present

## 2018-08-10 LAB — TSH: TSH: 5.32 u[IU]/mL — ABNORMAL HIGH (ref 0.450–4.500)

## 2018-08-11 ENCOUNTER — Other Ambulatory Visit: Payer: Self-pay | Admitting: Family Medicine

## 2018-08-11 MED ORDER — LEVOTHYROXINE SODIUM 75 MCG PO TABS: 75.0000 ug | ORAL_TABLET | Freq: Every day | ORAL | 1 refills | Status: DC

## 2018-08-11 NOTE — Progress Notes (Signed)
sythroi

## 2018-08-14 ENCOUNTER — Telehealth: Payer: Self-pay | Admitting: Family Medicine

## 2018-08-14 NOTE — Telephone Encounter (Signed)
Copied from [place] [id number]. Topic: Quick Communication - See Telephone Encounter >> Aug 14, 2018 10:34 AM Wendy Kelly wrote: CRM for notification. See Telephone encounter for: 08/14/18.  Patient called to inquire why dosage for levothyroxine (SYNTHROID, LEVOTHROID) 50 MCG tablet has now went to 75 and also why she was not informed. Please advise.

## 2018-08-14 NOTE — Telephone Encounter (Signed)
Looks like [place] sent her a [employer] on 08/11/18 saying that her thyroid was still under treated and letting her know she changed her dose. Did she not get that?

## 2018-08-14 NOTE — Telephone Encounter (Signed)
Message relayed to patient. Verbalized understanding and denied questions. Advised pt that most communication would be done via mychart since that is set up and the quickest form of communication with provider. Pt stated she got the value but no message from Mrs. Orene Desanctis, PA-C. Read message from rachel to patient and advised where she would find that on her mychart.

## 2018-08-15 ENCOUNTER — Encounter: Payer: Self-pay | Admitting: Family Medicine

## 2018-08-15 ENCOUNTER — Ambulatory Visit (INDEPENDENT_AMBULATORY_CARE_PROVIDER_SITE_OTHER): Payer: BLUE CROSS/BLUE SHIELD | Admitting: Family Medicine

## 2018-08-15 VITALS — BP 102/66 | HR 69 | Temp 98.3°F | Ht 63.0 in | Wt 142.8 lb

## 2018-08-15 DIAGNOSIS — R109 Unspecified abdominal pain: Secondary | ICD-10-CM

## 2018-08-15 DIAGNOSIS — R1033 Periumbilical pain: Secondary | ICD-10-CM | POA: Diagnosis not present

## 2018-08-15 LAB — CBC WITH DIFFERENTIAL/PLATELET
Hematocrit: 38 % (ref 34.0–46.6)
Hemoglobin: 12.6 g/dL (ref 11.1–15.9)
LYMPHS: 11 %
Lymphocytes Absolute: 0.6 10*3/uL — ABNORMAL LOW (ref 0.7–3.1)
MCH: 31 pg (ref 26.6–33.0)
MCHC: 33.2 g/dL (ref 31.5–35.7)
MCV: 94 fL (ref 79–97)
MID (Absolute): 0.4 10*3/uL (ref 0.1–1.6)
MID: 7 %
Neutrophils Absolute: 4.1 10*3/uL (ref 1.4–7.0)
Neutrophils: 82 %
Platelets: 112 10*3/uL — ABNORMAL LOW (ref 150–450)
RBC: 4.06 x10E6/uL (ref 3.77–5.28)
RDW: 13.5 % (ref 11.7–15.4)
WBC: 5.1 10*3/uL (ref 3.4–10.8)

## 2018-08-15 LAB — UA/M W/RFLX CULTURE, ROUTINE
Bilirubin, UA: NEGATIVE
Glucose, UA: NEGATIVE
KETONES UA: NEGATIVE
Leukocytes, UA: NEGATIVE
NITRITE UA: NEGATIVE
Protein, UA: NEGATIVE
RBC, UA: NEGATIVE
Specific Gravity, UA: 1.015 (ref 1.005–1.030)
Urobilinogen, Ur: 0.2 mg/dL (ref 0.2–1.0)
pH, UA: 7 (ref 5.0–7.5)

## 2018-08-15 NOTE — Progress Notes (Signed)
BP 102/66 (BP Location: Left Arm, Patient Position: Sitting, Cuff Size: Normal)   Pulse 69   Temp 98.3 F (36.8 C) (Oral)   Ht 5\' 3"  (1.6 m)   Wt 142 lb 12.8 oz (64.8 kg)   SpO2 99%   BMI 25.30 kg/m    Subjective:    Patient ID: Wendy Kelly, female    DOB: 1955/08/27, 63 y.o.   MRN: [id number]  HPI: Megyn Leng is a 63 y.o. female  Chief Complaint  Patient presents with  . Back Pain    Low back pain. Ongoing off and on couple weeks. Worse this morning.  . Flank Pain    Right Sided LLQ to belly button. Began this morning.  . Nausea   ABDOMINAL PAIN  Duration: couple of weeks on the back, belly started today Onset: gradual Severity: moderate Quality: sore Location:  peri-umbilical and LLQ  Episode duration: constant Radiation: into belly button Frequency: constant Alleviating factors: nothing Aggravating factors: nothing Status: stable Treatments attempted: none Fever: no Nausea: yes Vomiting: no Weight loss: no Decreased appetite: yes Diarrhea: no Constipation: no Blood in stool: no Heartburn: no Jaundice: no Rash: no Dysuria/urinary frequency: no Hematuria: no History of sexually transmitted disease: no Recurrent NSAID use: no  Relevant past medical, surgical, family and social history reviewed and updated as indicated. Interim medical history since our last visit reviewed. Allergies and medications reviewed and updated.  Review of Systems  Constitutional: Positive for appetite change. Negative for activity change, chills, diaphoresis, fatigue, fever and unexpected weight change.  Respiratory: Negative.   Cardiovascular: Negative.   Gastrointestinal: Positive for abdominal pain and nausea. Negative for abdominal distention, anal bleeding, blood in stool, constipation, diarrhea, rectal pain and vomiting.  Musculoskeletal: Negative.   Skin: Negative.   Neurological: Negative.   Psychiatric/Behavioral: Negative.     Per HPI unless specifically  indicated above     Objective:    BP 102/66 (BP Location: Left Arm, Patient Position: Sitting, Cuff Size: Normal)   Pulse 69   Temp 98.3 F (36.8 C) (Oral)   Ht 5\' 3"  (1.6 m)   Wt 142 lb 12.8 oz (64.8 kg)   SpO2 99%   BMI 25.30 kg/m   Wt Readings from Last 3 Encounters:  08/15/18 142 lb 12.8 oz (64.8 kg)  07/24/18 143 lb 13.6 oz (65.2 kg)  07/10/18 141 lb 8 oz (64.2 kg)    Physical Exam Vitals signs and nursing note reviewed.  Constitutional:      General: She is not in acute distress.    Appearance: Normal appearance. She is not ill-appearing, toxic-appearing or diaphoretic.  HENT:     Head: Normocephalic and atraumatic.     Right Ear: External ear normal.     Left Ear: External ear normal.     Nose: Nose normal.     Mouth/Throat:     Mouth: Mucous membranes are moist.     Pharynx: Oropharynx is clear.  Eyes:     General: No scleral icterus.       Right eye: No discharge.        Left eye: No discharge.     Extraocular Movements: Extraocular movements intact.     Conjunctiva/sclera: Conjunctivae normal.     Pupils: Pupils are equal, round, and reactive to light.  Neck:     Musculoskeletal: Normal range of motion and neck supple.  Cardiovascular:     Rate and Rhythm: Normal rate and regular rhythm.     Pulses: Normal  pulses.     Heart sounds: Normal heart sounds. No murmur. No friction rub. No gallop.   Pulmonary:     Effort: Pulmonary effort is normal. No respiratory distress.     Breath sounds: Normal breath sounds. No stridor. No wheezing, rhonchi or rales.  Chest:     Chest wall: No tenderness.  Abdominal:     General: Abdomen is flat. Bowel sounds are normal. There is no distension.     Palpations: Abdomen is soft. There is no mass.     Tenderness: There is no abdominal tenderness. There is no right CVA tenderness, left CVA tenderness, guarding or rebound.     Hernia: No hernia is present.  Musculoskeletal: Normal range of motion.  Skin:    General: Skin  is warm and dry.     Capillary Refill: Capillary refill takes less than 2 seconds.     Coloration: Skin is not jaundiced or pale.     Findings: No bruising, erythema, lesion or rash.  Neurological:     General: No focal deficit present.     Mental Status: She is alert and oriented to person, place, and time. Mental status is at baseline.  Psychiatric:        Mood and Affect: Mood normal.        Behavior: Behavior normal.        Thought Content: Thought content normal.        Judgment: Judgment normal.     Results for orders placed or performed in visit on 08/15/18  UA/M w/rflx Culture, Routine  Result Value Ref Range   Specific Gravity, UA 1.015 1.005 - 1.030   pH, UA 7.0 5.0 - 7.5   Color, UA Yellow Yellow   Appearance Ur Clear Clear   Leukocytes, UA Negative Negative   Protein, UA Negative Negative/Trace   Glucose, UA Negative Negative   Ketones, UA Negative Negative   RBC, UA Negative Negative   Bilirubin, UA Negative Negative   Urobilinogen, Ur 0.2 0.2 - 1.0 mg/dL   Nitrite, UA Negative Negative  CBC With Differential/Platelet  Result Value Ref Range   WBC 5.1 3.4 - 10.8 x10E3/uL   RBC 4.06 3.77 - 5.28 x10E6/uL   Hemoglobin 12.6 11.1 - 15.9 g/dL   Hematocrit 38.0 34.0 - 46.6 %   MCV 94 79 - 97 fL   MCH 31.0 26.6 - 33.0 pg   MCHC 33.2 31.5 - 35.7 g/dL   RDW 13.5 11.7 - 15.4 %   Platelets 112 (L) 150 - 450 x10E3/uL   Neutrophils 82 Not Estab. %   Lymphs 11 Not Estab. %   MID 7 Not Estab. %   Neutrophils Absolute 4.1 1.4 - 7.0 x10E3/uL   Lymphocytes Absolute 0.6 (L) 0.7 - 3.1 x10E3/uL   MID (Absolute) 0.4 0.1 - 1.6 X10E3/uL      Assessment & Plan:   Problem List Items Addressed This Visit    None    Visit Diagnoses    Periumbilical abdominal pain    -  Primary   UA and CBC normal. Discussed getting CT- will hold off for now. Watch closely, if getting worse or not getting better, will obtain imaging. Call with concerns.    Relevant Orders   CBC With  Differential/Platelet (Completed)   Flank pain       Relevant Orders   UA/M w/rflx Culture, Routine (Completed)       Follow up plan: Return if symptoms worsen or fail to improve.

## 2018-08-16 ENCOUNTER — Encounter: Payer: Self-pay | Admitting: Family Medicine

## 2018-08-29 LAB — CUP PACEART INCLINIC DEVICE CHECK
Battery Remaining Longevity: 149 mo
Battery Voltage: 2.79 V
Brady Statistic AP VP Percent: 0 %
Brady Statistic AP VS Percent: 14 %
Brady Statistic AS VP Percent: 0 %
Brady Statistic AS VS Percent: 86 %
Date Time Interrogation Session: 20191203180924
Implantable Lead Implant Date: 20061128
Implantable Lead Implant Date: 20061128
Implantable Lead Location: 753859
Implantable Lead Location: 753860
Implantable Lead Model: 4092
Implantable Lead Model: 4592
Implantable Pulse Generator Implant Date: 20161123
Lead Channel Impedance Value: 524 Ohm
Lead Channel Impedance Value: 717 Ohm
Lead Channel Pacing Threshold Amplitude: 0.5 V
Lead Channel Pacing Threshold Amplitude: 1 V
Lead Channel Pacing Threshold Pulse Width: 0.4 ms
Lead Channel Pacing Threshold Pulse Width: 0.4 ms
Lead Channel Sensing Intrinsic Amplitude: 11.2 mV
Lead Channel Sensing Intrinsic Amplitude: 4 mV
Lead Channel Setting Pacing Amplitude: 2 V
Lead Channel Setting Pacing Pulse Width: 0.4 ms
Lead Channel Setting Sensing Sensitivity: 4 mV
MDC IDC MSMT BATTERY IMPEDANCE: 138 Ohm
MDC IDC SET LEADCHNL RV PACING AMPLITUDE: 2.5 V

## 2018-09-08 ENCOUNTER — Telehealth: Payer: Self-pay | Admitting: Family Medicine

## 2018-09-08 MED ORDER — LEVOTHYROXINE SODIUM 75 MCG PO TABS: 75.0000 ug | ORAL_TABLET | Freq: Every day | ORAL | 1 refills | Status: DC

## 2018-09-08 NOTE — Telephone Encounter (Signed)
Copied from [place] [id number]. Topic: General - Other >> Sep 08, 2018  8:25 AM Lennox Solders wrote: Reason for CRM: pt is calling and she needs name brand synthroid not generic . Pt was on name brand and pharm gave her generic and pt can tell the different  [employer] [address] in [place]

## 2018-09-11 ENCOUNTER — Ambulatory Visit: Payer: BLUE CROSS/BLUE SHIELD | Admitting: Family Medicine

## 2018-09-12 ENCOUNTER — Ambulatory Visit (INDEPENDENT_AMBULATORY_CARE_PROVIDER_SITE_OTHER): Payer: BLUE CROSS/BLUE SHIELD | Admitting: *Deleted

## 2018-09-12 DIAGNOSIS — D2272 Melanocytic nevi of left lower limb, including hip: Secondary | ICD-10-CM | POA: Diagnosis not present

## 2018-09-12 DIAGNOSIS — I441 Atrioventricular block, second degree: Secondary | ICD-10-CM

## 2018-09-12 DIAGNOSIS — L570 Actinic keratosis: Secondary | ICD-10-CM | POA: Diagnosis not present

## 2018-09-12 DIAGNOSIS — Z85828 Personal history of other malignant neoplasm of skin: Secondary | ICD-10-CM | POA: Diagnosis not present

## 2018-09-12 DIAGNOSIS — I480 Paroxysmal atrial fibrillation: Secondary | ICD-10-CM

## 2018-09-12 DIAGNOSIS — D2261 Melanocytic nevi of right upper limb, including shoulder: Secondary | ICD-10-CM | POA: Diagnosis not present

## 2018-09-12 DIAGNOSIS — D2262 Melanocytic nevi of left upper limb, including shoulder: Secondary | ICD-10-CM | POA: Diagnosis not present

## 2018-09-13 ENCOUNTER — Ambulatory Visit: Payer: BLUE CROSS/BLUE SHIELD | Admitting: Family Medicine

## 2018-09-13 LAB — CUP PACEART REMOTE DEVICE CHECK
Battery Impedance: 163 Ohm
Battery Remaining Longevity: 143 mo
Battery Voltage: 2.78 V
Brady Statistic AP VP Percent: 0 %
Brady Statistic AS VP Percent: 0 %
Brady Statistic AS VS Percent: 83 %
Date Time Interrogation Session: 20200303103857
Implantable Lead Implant Date: 20061128
Implantable Lead Implant Date: 20061128
Implantable Lead Location: 753859
Implantable Lead Location: 753860
Implantable Lead Model: 4092
Implantable Lead Model: 4592
Implantable Pulse Generator Implant Date: 20161123
Lead Channel Impedance Value: 565 Ohm
Lead Channel Impedance Value: 701 Ohm
Lead Channel Pacing Threshold Amplitude: 0.375 V
Lead Channel Pacing Threshold Amplitude: 1 V
Lead Channel Pacing Threshold Pulse Width: 0.4 ms
Lead Channel Pacing Threshold Pulse Width: 0.4 ms
Lead Channel Setting Pacing Amplitude: 2 V
Lead Channel Setting Pacing Amplitude: 2.5 V
Lead Channel Setting Pacing Pulse Width: 0.4 ms
Lead Channel Setting Sensing Sensitivity: 2.8 mV
MDC IDC STAT BRADY AP VS PERCENT: 16 %

## 2018-09-20 ENCOUNTER — Encounter: Payer: Self-pay | Admitting: Cardiology

## 2018-09-20 ENCOUNTER — Other Ambulatory Visit: Payer: Self-pay

## 2018-09-20 ENCOUNTER — Other Ambulatory Visit: Payer: BLUE CROSS/BLUE SHIELD

## 2018-09-20 DIAGNOSIS — R7989 Other specified abnormal findings of blood chemistry: Secondary | ICD-10-CM

## 2018-09-20 DIAGNOSIS — R946 Abnormal results of thyroid function studies: Secondary | ICD-10-CM | POA: Diagnosis not present

## 2018-09-20 NOTE — Progress Notes (Signed)
Remote pacemaker transmission.   

## 2018-09-21 LAB — TSH: TSH: 3.33 u[IU]/mL (ref 0.450–4.500)

## 2018-09-24 DIAGNOSIS — J029 Acute pharyngitis, unspecified: Secondary | ICD-10-CM | POA: Diagnosis not present

## 2018-09-24 DIAGNOSIS — R070 Pain in throat: Secondary | ICD-10-CM | POA: Diagnosis not present

## 2018-09-25 ENCOUNTER — Telehealth: Payer: Self-pay | Admitting: Family Medicine

## 2018-09-25 NOTE — Telephone Encounter (Signed)
Copied from [place] [id number]. Topic: General - Inquiry >> Sep 25, 2018 11:38 AM Alanda Slim E wrote: Reason for CRM: Pt was scheduled to come in but she has been sick this weekend and wants to know if [place] or nurse can just call her to discuss her taking the levothyroxine (SYNTHROID, LEVOTHROID) 75 MCG tablet and her lab results / Pt is trying to avoid getting sick/ please call and advise

## 2018-09-26 ENCOUNTER — Encounter: Payer: Self-pay | Admitting: Family Medicine

## 2018-09-26 ENCOUNTER — Other Ambulatory Visit: Payer: Self-pay

## 2018-09-26 ENCOUNTER — Ambulatory Visit (INDEPENDENT_AMBULATORY_CARE_PROVIDER_SITE_OTHER): Payer: BLUE CROSS/BLUE SHIELD | Admitting: Family Medicine

## 2018-09-26 VITALS — BP 118/75 | HR 68 | Temp 98.5°F

## 2018-09-26 DIAGNOSIS — J069 Acute upper respiratory infection, unspecified: Secondary | ICD-10-CM | POA: Diagnosis not present

## 2018-09-26 DIAGNOSIS — E039 Hypothyroidism, unspecified: Secondary | ICD-10-CM | POA: Diagnosis not present

## 2018-09-26 LAB — VERITOR FLU A/B WAIVED
INFLUENZA A: NEGATIVE
Influenza B: NEGATIVE

## 2018-09-26 MED ORDER — LEVOTHYROXINE SODIUM 75 MCG PO TABS: 75.0000 ug | ORAL_TABLET | Freq: Every day | ORAL | 3 refills | Status: DC

## 2018-09-26 NOTE — Assessment & Plan Note (Signed)
TSH 3.3- happy with how she is feeling. Will continue at current dose. Call with any concerns.

## 2018-09-26 NOTE — Progress Notes (Signed)
BP 118/75   Pulse 68   Temp 98.5 F (36.9 C) (Oral)   SpO2 100%    Subjective:    Patient ID: Wendy Kelly, female    DOB: 1956/07/03, 63 y.o.   MRN: [id number]  HPI: Wendy Kelly is a 63 y.o. female  Chief Complaint  Patient presents with  . Medication Management    Thyroid medication.   . Sore Throat    Did have rapid strep this weekend. Was negative. Was given antibiotic has not taken Clarithromycin   HYPOTHYROIDISM- still having trouble sleeping Thyroid control status:stable Satisfied with current treatment? yes Medication side effects: no Medication compliance: excellent compliance Etiology of hypothyroidism:  Recent dose adjustment:no Fatigue: yes Cold intolerance: no Heat intolerance: no Weight gain: no Weight loss: no Constipation: no Diarrhea/loose stools: no Palpitations: no Lower extremity edema: no Anxiety/depressed mood: no  UPPER RESPIRATORY TRACT INFECTION Duration: 3 days Worst symptom: sore throat Fever: no Cough: no Shortness of breath: no Wheezing: no Chest pain: no Chest tightness: no Chest congestion: no Nasal congestion: yes Runny nose: yes Post nasal drip: yes Sneezing: no Sore throat: yes Swollen glands: no Sinus pressure: yes Headache: yes Face pain: yes Toothache: yes Ear pain: yes "right Ear pressure: yes "right Eyes red/itching:no Eye drainage/crusting: no  Vomiting: yes  Diarrhea: yes Rash: no Fatigue: yes Sick contacts: yes Strep contacts: no  Context: better Recurrent sinusitis: no Relief with OTC cold/cough medications: no  Treatments attempted: none   Relevant past medical, surgical, family and social history reviewed and updated as indicated. Interim medical history since our last visit reviewed. Allergies and medications reviewed and updated.  Review of Systems  Constitutional: Positive for fatigue. Negative for activity change, appetite change, chills, diaphoresis, fever and unexpected weight change.   HENT: Negative.   Respiratory: Negative.   Cardiovascular: Negative.   Gastrointestinal: Positive for diarrhea, nausea and vomiting. Negative for abdominal distention, abdominal pain, anal bleeding, blood in stool, constipation and rectal pain.  Musculoskeletal: Negative.   Skin: Negative.   Neurological: Positive for syncope and weakness. Negative for dizziness, tremors, seizures, facial asymmetry, speech difficulty, light-headedness, numbness and headaches.  Hematological: Negative.   Psychiatric/Behavioral: Negative.     Per HPI unless specifically indicated above     Objective:    BP 118/75   Pulse 68   Temp 98.5 F (36.9 C) (Oral)   SpO2 100%   Wt Readings from Last 3 Encounters:  08/15/18 142 lb 12.8 oz (64.8 kg)  07/24/18 143 lb 13.6 oz (65.2 kg)  07/10/18 141 lb 8 oz (64.2 kg)    Physical Exam Vitals signs and nursing note reviewed.  Constitutional:      General: She is not in acute distress.    Appearance: Normal appearance. She is normal weight. She is not ill-appearing, toxic-appearing or diaphoretic.  HENT:     Head: Normocephalic and atraumatic.     Right Ear: Tympanic membrane, ear canal and external ear normal. No drainage, swelling or tenderness. No middle ear effusion. Tympanic membrane is not erythematous.     Left Ear: Tympanic membrane, ear canal and external ear normal. No drainage, swelling or tenderness.  No middle ear effusion. Tympanic membrane is not erythematous.     Nose: Nose normal. No congestion or rhinorrhea.     Mouth/Throat:     Mouth: Mucous membranes are moist. No oral lesions.     Pharynx: Oropharynx is clear. No pharyngeal swelling, oropharyngeal exudate, posterior oropharyngeal erythema or uvula swelling.  Tonsils: No tonsillar exudate or tonsillar abscesses.  Eyes:     General: No scleral icterus.       Right eye: No discharge.        Left eye: No discharge.     Extraocular Movements: Extraocular movements intact.     Right eye:  Normal extraocular motion.     Left eye: Normal extraocular motion.     Conjunctiva/sclera: Conjunctivae normal.     Pupils: Pupils are equal, round, and reactive to light.  Neck:     Musculoskeletal: Normal range of motion and neck supple.     Thyroid: No thyromegaly.  Cardiovascular:     Rate and Rhythm: Normal rate and regular rhythm.     Pulses: Normal pulses.     Heart sounds: Normal heart sounds. No murmur. No friction rub. No gallop.   Pulmonary:     Effort: Pulmonary effort is normal. No respiratory distress.     Breath sounds: Normal breath sounds. No stridor. No wheezing, rhonchi or rales.  Chest:     Chest wall: No tenderness.  Abdominal:     General: Bowel sounds are normal. There is no distension.     Palpations: Abdomen is soft. There is no mass.     Tenderness: There is no abdominal tenderness. There is no guarding or rebound.     Hernia: No hernia is present.  Musculoskeletal: Normal range of motion.  Lymphadenopathy:     Cervical: No cervical adenopathy.  Skin:    General: Skin is warm and dry.     Capillary Refill: Capillary refill takes less than 2 seconds.     Coloration: Skin is not jaundiced or pale.     Findings: No bruising, erythema, lesion or rash.  Neurological:     General: No focal deficit present.     Mental Status: She is alert and oriented to person, place, and time. Mental status is at baseline.  Psychiatric:        Mood and Affect: Mood normal.        Behavior: Behavior normal.        Thought Content: Thought content normal.        Judgment: Judgment normal.     Results for orders placed or performed in visit on 09/20/18  TSH  Result Value Ref Range   TSH 3.330 0.450 - 4.500 uIU/mL      Assessment & Plan:   Problem List Items Addressed This Visit      Endocrine   Hypothyroidism    TSH 3.3- happy with how she is feeling. Will continue at current dose. Call with any concerns.       Relevant Medications   levothyroxine (SYNTHROID,  LEVOTHROID) 75 MCG tablet    Other Visit Diagnoses    Upper respiratory tract infection, unspecified type    -  Primary   Mild- patient does not want to quarantine for 2 weeks. Flu negative. Will swab for COVID. Self quarantine until test back or 2 weeks. Call with concerns.    Relevant Orders   Veritor Flu A/B Waived   Novel Coronavirus, NAA ([facility])       Follow up plan: Return October, for Physical.

## 2018-09-26 NOTE — Telephone Encounter (Signed)
Please let her know that I don't know if we can do tele-visits, but we're working on it. If she wants to cancel though, that is totally fine.

## 2018-09-29 ENCOUNTER — Ambulatory Visit: Payer: Self-pay | Admitting: Oncology

## 2018-09-29 ENCOUNTER — Other Ambulatory Visit: Payer: Self-pay

## 2018-09-29 ENCOUNTER — Encounter: Payer: Self-pay | Admitting: Family Medicine

## 2018-09-30 ENCOUNTER — Encounter: Payer: Self-pay | Admitting: Family Medicine

## 2018-10-01 ENCOUNTER — Encounter: Payer: Self-pay | Admitting: Family Medicine

## 2018-10-02 ENCOUNTER — Encounter: Payer: Self-pay | Admitting: Family Medicine

## 2018-10-03 ENCOUNTER — Encounter: Payer: Self-pay | Admitting: Family Medicine

## 2018-10-05 ENCOUNTER — Telehealth: Payer: Self-pay | Admitting: Family Medicine

## 2018-10-05 LAB — NOVEL CORONAVIRUS, NAA: SARS-CoV-2, NAA: NOT DETECTED

## 2018-10-05 NOTE — Telephone Encounter (Signed)
Please call patient and let her know that she tested negative for Coronavirus. She is free to leave quarantine. Please schedule her with a virtual visit for return to work if she would like to return to work.

## 2018-10-05 NOTE — Telephone Encounter (Signed)
Message relayed to patient. Verbalized understanding and denied questions. Pt does not need note, her employer is shut down ATM.  

## 2018-10-19 ENCOUNTER — Other Ambulatory Visit: Payer: Self-pay | Admitting: *Deleted

## 2018-10-19 DIAGNOSIS — I712 Thoracic aortic aneurysm, without rupture, unspecified: Secondary | ICD-10-CM

## 2018-11-06 ENCOUNTER — Encounter: Payer: Self-pay | Admitting: *Deleted

## 2018-11-07 ENCOUNTER — Ambulatory Visit: Payer: Self-pay | Admitting: Oncology

## 2018-11-07 ENCOUNTER — Other Ambulatory Visit: Payer: Self-pay

## 2018-11-07 ENCOUNTER — Other Ambulatory Visit: Payer: Self-pay | Admitting: *Deleted

## 2018-11-07 ENCOUNTER — Inpatient Hospital Stay: Payer: BLUE CROSS/BLUE SHIELD | Attending: Oncology

## 2018-11-07 DIAGNOSIS — C109 Malignant neoplasm of oropharynx, unspecified: Secondary | ICD-10-CM

## 2018-11-07 DIAGNOSIS — D649 Anemia, unspecified: Secondary | ICD-10-CM

## 2018-11-07 DIAGNOSIS — C01 Malignant neoplasm of base of tongue: Secondary | ICD-10-CM | POA: Diagnosis not present

## 2018-11-07 LAB — CBC WITH DIFFERENTIAL/PLATELET
Abs Immature Granulocytes: 0.01 10*3/uL (ref 0.00–0.07)
Basophils Absolute: 0 10*3/uL (ref 0.0–0.1)
Basophils Relative: 1 %
Eosinophils Absolute: 0.1 10*3/uL (ref 0.0–0.5)
Eosinophils Relative: 4 %
HCT: 38.1 % (ref 36.0–46.0)
Hemoglobin: 12.7 g/dL (ref 12.0–15.0)
Immature Granulocytes: 0 %
Lymphocytes Relative: 17 %
Lymphs Abs: 0.6 10*3/uL — ABNORMAL LOW (ref 0.7–4.0)
MCH: 31.4 pg (ref 26.0–34.0)
MCHC: 33.3 g/dL (ref 30.0–36.0)
MCV: 94.1 fL (ref 80.0–100.0)
Monocytes Absolute: 0.4 10*3/uL (ref 0.1–1.0)
Monocytes Relative: 11 %
Neutro Abs: 2.5 10*3/uL (ref 1.7–7.7)
Neutrophils Relative %: 67 %
Platelets: 135 10*3/uL — ABNORMAL LOW (ref 150–400)
RBC: 4.05 MIL/uL (ref 3.87–5.11)
RDW: 13 % (ref 11.5–15.5)
WBC: 3.7 10*3/uL — ABNORMAL LOW (ref 4.0–10.5)
nRBC: 0 % (ref 0.0–0.2)

## 2018-11-07 LAB — FOLATE: Folate: 19.9 ng/mL (ref >5.9)

## 2018-11-07 LAB — IRON AND TIBC
Iron: 72 ug/dL (ref 28–170)
Saturation Ratios: 22 % (ref 10.4–31.8)
TIBC: 330 ug/dL (ref 250–450)
UIBC: 258 ug/dL

## 2018-11-07 LAB — FERRITIN: Ferritin: 40 ng/mL (ref 11–307)

## 2018-11-07 LAB — VITAMIN B12: Vitamin B-12: 290 pg/mL (ref 180–914)

## 2018-11-08 ENCOUNTER — Ambulatory Visit: Payer: Self-pay | Admitting: Oncology

## 2018-11-08 ENCOUNTER — Telehealth: Payer: Self-pay | Admitting: Oncology

## 2018-11-09 ENCOUNTER — Other Ambulatory Visit: Payer: Self-pay

## 2018-11-09 ENCOUNTER — Encounter: Payer: Self-pay | Admitting: Oncology

## 2018-11-09 ENCOUNTER — Inpatient Hospital Stay (HOSPITAL_BASED_OUTPATIENT_CLINIC_OR_DEPARTMENT_OTHER): Payer: BLUE CROSS/BLUE SHIELD | Admitting: Oncology

## 2018-11-09 DIAGNOSIS — D649 Anemia, unspecified: Secondary | ICD-10-CM

## 2018-11-09 DIAGNOSIS — C109 Malignant neoplasm of oropharynx, unspecified: Secondary | ICD-10-CM | POA: Diagnosis not present

## 2018-11-09 NOTE — Progress Notes (Signed)
Pt is doing ok but her energy is still not back to her before chemo status. She cont. To have dry mouth which is because of radiation for her cancer. She has found certain fruits that she can taste and they do not taste horrible to her and she eats vegetables. She eat no meats due to her dryness and it will get stuck going down. She is working and doing good with that

## 2018-11-10 NOTE — Progress Notes (Signed)
I connected with Wendy Kelly on 11/10/18 at 10:00 AM EDT by video enabled telemedicine visit and verified that I am speaking with the correct person using two identifiers.   I discussed the limitations, risks, security and privacy concerns of performing an evaluation and management service by telemedicine and the availability of in-person appointments. I also discussed with the patient that there may be a patient responsible charge related to this service. The patient expressed understanding and agreed to proceed.  Other persons participating in the visit and their role in the encounter:  none  Patient's location:  home Provider's location:  [facility]  Chief Complaint: Routine follow-up of head and neck cancer in remission  Diagnosis- 1. 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 Consuela Mimes evaluation of right neck mass. The mass has been present for or a year and has been growing gradually. It was associated with some right ear pain but no fever. Patient has been a never smoker.Ultrasound on 05/30/2017 showed:Single enlarged lymph node in the right neck measures approximately 4.8 x 1.7 x 3.0 cm  2.Patient underwent IR guided lymph node biopsy of the cervical lymph node which showed metastatic squamous cell carcinoma. Staining for P 16 was positive and sox 10 was negative   3.PET/CT scan on 06/09/2017 showed intensely hypermetabolic enlarged right level 2 cervical lymph node with an SUV of 11.1. No clear site of primary malignancy within the head and neck. Potential site of investigation would include right base of tongue. There was asymmetric metabolic activity noted in that area with an SUV of 4.5. Less suspicious region in the posterior glottis. No evidence of distant metastatic disease.  4.Patient works as a Geophysicist/field seismologist at  [employer]. Patient has had multiple allergies to medications in the past. Reports that when she took steroids in the past it made her quite aggravated. Her sister was hospitalized in a mental health facility after receiving a steroid course in the past.  5. Right tongue base biopsy positive for SCC.Baseline audiogram normal. Concurrent chemo/RT completed on 09/05/17.  6. LLE DVT on 09/27/17 that was diagnosed right after she finished chemotherapy. Currently on xarelto   Interval history patient continues to have some symptoms of dry mouth as well as altered taste sensation.  She has learned to live with the side effects and managed to find foods that taste better.  Neuropathy in her hands has resolved but she still has some mild neuropathies in her feet.  Denies any lumps or bumps anywhere.  Appetite is good and she denies any unintentional weight loss   Review of Systems  Constitutional: Negative for chills, fever, malaise/fatigue and weight loss.  HENT: Negative for congestion, ear discharge and nosebleeds.        Dry mouth and altered taste sensation  Eyes: Negative for blurred vision.  Respiratory: Negative for cough, hemoptysis, sputum production, shortness of breath and wheezing.   Cardiovascular: Negative for chest pain, palpitations, orthopnea and claudication.  Gastrointestinal: Negative for abdominal pain, blood in stool, constipation, diarrhea, heartburn, melena, nausea and vomiting.  Genitourinary: Negative for dysuria, flank pain, frequency, hematuria and urgency.  Musculoskeletal: Negative for back pain, joint pain and myalgias.  Skin: Negative for rash.  Neurological: Negative for dizziness, tingling, focal weakness, seizures, weakness and headaches.  Endo/Heme/Allergies: Does not bruise/bleed easily.  Psychiatric/Behavioral: Negative for depression and suicidal ideas. The patient does  not have insomnia.     Allergies  Allergen Reactions  . Cefdinir Swelling     Swelling in mouth   . Cephalosporins Swelling  . Dairy Aid [Lactase] Swelling    Milk and milk products cause swelling and tingling of tongue  . Flagyl [Metronidazole] Swelling    Mouth and throat and ears go red and burn  . Lactose Intolerance (Gi) Other (See Comments)    GI upset.  Eats only gluten free  . Penicillins Rash    Rash/hives  . Phenylephrine-Guaifenesin Anaphylaxis    Night terrors  . Sulfa Antibiotics Rash and Hives    Other reaction(s): Unknown   . Sulfonamide Derivatives Rash    Rash/itching.  From head to her toes, the itching was horrible. Penicillin is the worst  . Tape Rash    Adhesives all cause a problem severely.  It doesn't matter if it is paper tape or not.  Marland Kitchen Dexilant [Dexlansoprazole]     Pressure in head like head is in a vice and being squeezed  . Entex Lq [Phenylephrine-Guaifenesin]     Night terrors  . Other Other (See Comments)    Flu vaccine:  Weakness, dizziness, tia type symptoms.    . Oxycodone Nausea And Vomiting    And itching  . Succinylcholine Other (See Comments)    Trouble waking up  . Vancomycin Rash  . Tetracycline Other (See Comments)    Unkown  . Codeine Nausea And Vomiting  . Guaifenesin & Derivatives Other (See Comments)    Night terrors  . Lactobacillus Itching  . Naproxen Sodium     Itching/rash  . Pseudoephedrine Rash  . Tetracyclines & Related Other (See Comments)    Was taking flagyl at the same time; unsure which caused the swelling  Swelling of throat. Pharmacist states that he believes it was the flagyl and not the tetracycline  . Wheat Bran Other (See Comments)    Upset stomach. Now eats gluten free    Past Medical History:  Diagnosis Date  . AA (aortic aneurysm) ([place])    a. 04/2011 Echo: Ao Root: 4.1cm, Asc Ao 4.7cm.  . Anxiety   . AV block, 2nd degree    a. 05/2005 - s/p MDT Adapta ADDR01 Dual Chamber PPM  . Bicuspid aortic valve   . Cancer ([place])    Throat  . Chest pain    a. 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   . Dehydration 08/11/2017  . Depression   . Expressive aphasia    a. ongoing since 04/2011 - seen by neurology - ? TIA vs. Migraine  . Facial numbness    a. ongoing since 04/2011 - seen by neurology - ? TIA vs. Migraine  . GERD (gastroesophageal reflux disease)   . Low blood pressure   . Personal history of chemotherapy 2018-2019   Throat  . Personal history of radiation therapy 2018-2019   Throat  . Presence of permanent cardiac pacemaker   . 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 Syncope    a. 04/2011 Echo: EF 55-65%, No RWMA, Gr 1 DD.  Marland Kitchen Thyroid disease     Past Surgical History:  Procedure Laterality Date  . ABDOMINAL WALL MESH  REMOVAL    . bladder tack    . cataract Bilateral 2004  . CHOLECYSTECTOMY  2000  . DILATION AND CURETTAGE OF UTERUS    . DIRECT LARYNGOSCOPY Right 06/16/2017   Procedure: MICRO DIRECT LARYNGOSCOPY WITH BIOPSY OF RIGHT BASE  OF TONGUE;  Surgeon: Carloyn Manner, MD;  Location: [facility];  Service: ENT;  Laterality: Right;  . EP IMPLANTABLE DEVICE N/A 06/04/2015   Procedure: PPM Generator Changeout;  Surgeon: Deboraha Sprang, MD;  Location: [facility];  Service: Cardiovascular;  Laterality: N/A;  . ESOPHAGOGASTRODUODENOSCOPY (EGD) WITH PROPOFOL N/A 12/18/2015   Procedure: ESOPHAGOGASTRODUODENOSCOPY (EGD) WITH gastric biopsy and dilation;  Surgeon: Lucilla Lame, MD;  Location: [facility];  Service: Endoscopy;  Laterality: N/A;  . EYE SURGERY    . INSERT / REPLACE / REMOVE PACEMAKER    . IR FLUORO GUIDE PORT INSERTION RIGHT  06/22/2017  . IR REMOVAL TUN ACCESS W/ PORT W/O FL MOD SED  04/14/2018  . MOHS SURGERY  02/2018  . PACEMAKER INSERTION  2006   [employer] Adapta ADDR01  . TONSILLECTOMY    . TONSILLECTOMY    . VAGINAL HYSTERECTOMY  2013   mad/cope    Social History   Socioeconomic History  . Marital status: Married    Spouse name: Not on file  . Number of children: Not on file  . Years of education:  Not on file  . Highest education level: Not on file  Occupational History  . Not on file  Social Needs  . Financial resource strain: Not very hard  . Food insecurity:    Worry: Never true    Inability: Never true  . Transportation needs:    Medical: No    Non-medical: No  Tobacco Use  . Smoking status: Never Smoker  . Smokeless tobacco: Never Used  . Tobacco comment: tobacco use -no  Substance and Sexual Activity  . Alcohol use: No  . Drug use: No  . Sexual activity: Yes    Birth control/protection: Surgical  Lifestyle  . Physical activity:    Days per week: 4 days    Minutes per session: 60 min  . Stress: Rather much  Relationships  . Social connections:    Talks on phone: More than three times a week    Gets together: Not on file    Attends religious service: More than 4 times per year    Active member of club or organization: Not on file    Attends meetings of clubs or organizations: Not on file    Relationship status: Married  . Intimate partner violence:    Fear of current or ex partner: No    Emotionally abused: No    Physically abused: No    Forced sexual activity: No  Other Topics Concern  . Not on file  Social History Narrative   Lives in [place] with husband.  Works out regularly.  Works as a Geophysicist/field seismologist - owns own business.     Family History  Problem Relation Age of Onset  . COPD Mother        alive @ 28  . Atrial fibrillation Mother   . Stroke Father        died @ 1  . Breast cancer Maternal Aunt 70  . COPD Maternal Grandmother   . Ovarian cancer Neg Hx   . Diabetes Neg Hx      Current Outpatient Medications:  .  aspirin 81 MG tablet, Take 81 mg by mouth daily., Disp: , Rfl:  .  b complex vitamins tablet, Take 1 tablet by mouth daily., Disp: , Rfl:  .  BLACK ELDERBERRY,BERRY-FLOWER, PO, Take 1 capsule by mouth daily., Disp: , Rfl:  .  Calcium Carb-Cholecalciferol (CALCIUM 500 +  D3 PO), Take 1 Dose by mouth daily., Disp: , Rfl:   .  CRANBERRY SOFT PO, Take 2 each by mouth daily. , Disp: , Rfl:  .  Digestive Enzymes (DIGESTIVE ENZYME PO), Take 1-2 capsules by mouth daily., Disp: , Rfl:  .  levothyroxine (SYNTHROID, LEVOTHROID) 75 MCG tablet, Take 1 tablet (75 mcg total) by mouth daily. BRAND NAME SYNTHROID NECESSARY, Disp: 90 tablet, Rfl: 3 .  Loratadine (CLARITIN PO), Take by mouth daily., Disp: , Rfl:  .  montelukast (SINGULAIR) 10 MG tablet, Take 10 mg by mouth at bedtime., Disp: , Rfl:  .  nitroGLYCERIN (NITROSTAT) 0.4 MG SL tablet, Place 1 tablet (0.4 mg total) under the tongue every 5 (five) minutes as needed for chest pain., Disp: 25 tablet, Rfl: 3 .  PRESCRIPTION MEDICATION, Place 1 tablet vaginally 3 (three) times a week. E3 1mg  and Testosterone 1mg  compound, Disp: , Rfl:  No current facility-administered medications for this visit.   Facility-Administered Medications Ordered in Other Visits:  .  0.9 %  sodium chloride infusion, , Intravenous, Once, Sindy Guadeloupe, MD  No results found.  No images are attached to the encounter.   CMP Latest Ref Rng & Units 04/20/2018  Glucose 65 - 99 mg/dL 83  BUN 8 - 27 mg/dL 12  Creatinine 0.57 - 1.00 mg/dL 0.81  Sodium 134 - 144 mmol/L 144  Potassium 3.5 - 5.2 mmol/L 3.6  Chloride 96 - 106 mmol/L 103  CO2 20 - 29 mmol/L 25  Calcium 8.7 - 10.3 mg/dL 9.3  Total Protein 6.0 - 8.5 g/dL 6.3  Total Bilirubin 0.0 - 1.2 mg/dL 1.1  Alkaline Phos 39 - 117 IU/L 97  AST 0 - 40 IU/L 18  ALT 0 - 32 IU/L 19   CBC Latest Ref Rng & Units 11/07/2018  WBC 4.0 - 10.5 K/uL 3.7(L)  Hemoglobin 12.0 - 15.0 g/dL 12.7  Hematocrit 36.0 - 46.0 % 38.1  Platelets 150 - 400 K/uL 135(L)     Observation/objective: Appears in no acute distress over the video visit today.  Breathing is nonlabored  Assessment and plan: Patient is a 63 year old female with a history of HPV positive squamous cell carcinoma of the oropharynx stage I status post concurrent chemoradiation and currently in  remission.  This is a surveillance visit for her neck cancer  1. Patient will be following up with ENT next month.  Clinically she seems to be doing well and no concerning symptoms for recurrence.  2.  We did do her anemia work-up today and her B12 levels are borderline at 290.  Patient has been encouraged to continue taking her B12 supplements at this time.  We will hold off on B12 shots.  3.  Mild leukopenia and thrombocytopenia: Probably secondary to cisplatin.  This has remained stable.  Continue to monitor  4.  History of left lower extremity DVT: S/p 6 months of anticoagulation.  No concerning symptoms for recurrence  Follow-up instructions: I will see her back in 6 months with a CBC with differential, CMP and B12  I discussed the assessment and treatment plan with the patient. The patient was provided an opportunity to ask questions and all were answered. The patient agreed with the plan and demonstrated an understanding of the instructions.   The patient was advised to call back or seek an in-person evaluation if the symptoms worsen or if the condition fails to improve as anticipated.  Visit Diagnosis: 1. Normocytic anemia   2. Squamous cell  carcinoma of oropharynx ([place])     Dr. Randa Evens, MD, MPH [facility] [phone] 11/10/2018 8:51 AM

## 2018-11-17 DIAGNOSIS — Z85818 Personal history of malignant neoplasm of other sites of lip, oral cavity, and pharynx: Secondary | ICD-10-CM | POA: Diagnosis not present

## 2018-12-11 ENCOUNTER — Telehealth: Payer: Self-pay | Admitting: Cardiovascular Disease

## 2018-12-11 NOTE — Telephone Encounter (Signed)
Patient calling to check the status of appointment Patient refused evisit, requesting in office and according to appointment notes it looks as though a staff message may have been sent to Dr Rockey Situ to review if patient may be seen in office Please call patient to advise on status - aware may need to be rescheduled

## 2018-12-12 ENCOUNTER — Ambulatory Visit (INDEPENDENT_AMBULATORY_CARE_PROVIDER_SITE_OTHER): Payer: BC Managed Care – PPO | Admitting: *Deleted

## 2018-12-12 DIAGNOSIS — I441 Atrioventricular block, second degree: Secondary | ICD-10-CM

## 2018-12-12 LAB — CUP PACEART REMOTE DEVICE CHECK
Battery Impedance: 188 Ohm
Battery Remaining Longevity: 137 mo
Battery Voltage: 2.79 V
Brady Statistic AP VP Percent: 0 %
Brady Statistic AP VS Percent: 15 %
Brady Statistic AS VP Percent: 0 %
Brady Statistic AS VS Percent: 84 %
Date Time Interrogation Session: 20200602094139
Implantable Lead Implant Date: 20061128
Implantable Lead Implant Date: 20061128
Implantable Lead Location: 753859
Implantable Lead Location: 753860
Implantable Lead Model: 4092
Implantable Lead Model: 4592
Implantable Pulse Generator Implant Date: 20161123
Lead Channel Impedance Value: 584 Ohm
Lead Channel Impedance Value: 684 Ohm
Lead Channel Pacing Threshold Amplitude: 0.375 V
Lead Channel Pacing Threshold Amplitude: 1 V
Lead Channel Pacing Threshold Pulse Width: 0.4 ms
Lead Channel Pacing Threshold Pulse Width: 0.4 ms
Lead Channel Setting Pacing Amplitude: 2 V
Lead Channel Setting Pacing Amplitude: 2.5 V
Lead Channel Setting Pacing Pulse Width: 0.4 ms
Lead Channel Setting Sensing Sensitivity: 4 mV

## 2018-12-14 NOTE — Telephone Encounter (Signed)
Dr Rockey Situ,  Can she be in office?

## 2018-12-14 NOTE — Telephone Encounter (Signed)
Would probably suggest we meet after CT chest is done (early July?) that way we can go over images of aorta I do not have recent echo, last in 2018 so CT would be helpful

## 2018-12-15 NOTE — Telephone Encounter (Signed)
Scheduled 7/14 at 8 am

## 2018-12-15 NOTE — Telephone Encounter (Signed)
Patient needs appointment with Dr Rockey Situ scheduled after she's had the CT chest which is scheduled early July. This can be scheduled when she calls back and closed unless she has any further nurse questions. Thanks!  No answer. Left message to call back.

## 2018-12-18 ENCOUNTER — Ambulatory Visit: Payer: Self-pay | Admitting: Cardiovascular Disease

## 2018-12-20 ENCOUNTER — Encounter: Payer: Self-pay | Admitting: Cardiology

## 2018-12-20 NOTE — Progress Notes (Signed)
Remote pacemaker transmission.   

## 2019-01-10 ENCOUNTER — Other Ambulatory Visit: Payer: BC Managed Care – PPO

## 2019-01-10 ENCOUNTER — Ambulatory Visit: Payer: Self-pay | Admitting: Cardiothoracic Surgery

## 2019-01-20 NOTE — Progress Notes (Deleted)
Patient ID: Wendy Kelly, female   DOB: 07-Oct-1955, 63 y.o.   MRN: [id number] Cardiology Office Note  Date:  01/20/2019   ID:  Wendy Kelly, DOB 11/14/1955, MRN [id number]  PCP:  Wendy Roys, DO   No chief complaint on file.   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 that has been monitored with echocardiography and CT scans, No significant change since 2010 suspected bicuspid aortic valve on previous echocardiograms (no TEE performed) syncope second-degree AV block and pacemaker placement in November 2006,  episode of TIA-type symptoms with right facial droop, expressive aphasia that resolved,  intermittent chest pain, atypical in nature occasional nausea Chronic orthostasis , which she prefers to manage with fluids, previously Declined medications She presents for routine followup of her chest pain and ascending aorta dilation  New finding of enlarged lymph node right neck concerning for carcinoma on PET scan Concern for 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 Currently on xarelto No leg swelling  Fatigue Blood pressure running low, No orthostasis  CTA chest scheduled 12/2017 PET scan with CT November 2018: ascending aorta dilation not measured PET scan with CT May 2019 : ascending aorta 4.3 cm, though significant artifact from her port  in takes care of her 64 year old mother, no other help from family Previous episodes of chest pain, none recently Prior episodes of tachycardia felt secondary to stress and anxiety, none recently  EKG on today's visit shows normal sinus rhythm with rate 69 bpm,  nonspecific ST abnormality  Other past medical history reviewed Ascending thoracic aortic aneurysm now measuring 48 mm compared to 46 mm in 2015.    Echocardiogram January 2018 Aortic root: The aortic root was dilated at 4.2cm. - Ascending aorta: The ascending aorta was dilated at 4.6cm. No significant change  Last CT chest December 2015, aorta 4.6 cm at that time No change compared to CT scan August 2011, at that time 4.6 x 4.8 cm  Son recently diagnosed with dilated ascending aorta 4.2 cm, he is out of state  She has some atypical chest pain, improved symptoms with accupuncture and chiropractic Some issues with swallowing  CT scan neck reviewed images pulled up in the office today no significant carotid disease, aortic arch disease/calcification  She was seen in December 2015 in the hospital for chest pain. Echocardiogram was unchanged, chest pain felt to be noncardiac. Echocardiogram May and December 2015, relatively no change.   CT scan in November 2013. 4.7 cm ascending aorta at that time  She has had low potassium in the past on visits to the emergency room for chest pain  Last stress test was April 2013 performed for chest pain. Equivocal study, no large regions of ischemia, some attenuation artifact noted.  episode of tachycardia followed by chest pain 05/18/2012. Workup in the hospital was essentially negative. She had workup including carotid ultrasound that showed no significant disease.   Previous lab work shows total cholesterol 151, LDL 84, HDL 55, TSH 1.8  PMH:   has a past medical history of AA (aortic aneurysm) ([place]), Anxiety, AV block, 2nd degree, Bicuspid aortic  valve, Cancer ([place]), Chest pain, Dehydration (08/11/2017), Depression, Expressive aphasia, Facial numbness, GERD (gastroesophageal reflux disease), Low blood pressure, Personal history of chemotherapy (2018-2019), Personal history of radiation therapy (2018-2019), Presence of permanent cardiac  pacemaker, Squamous cell carcinoma (05/2017), Syncope, and Thyroid disease.  PSH:    Past Surgical History:  Procedure Laterality Date  . ABDOMINAL WALL MESH  REMOVAL    . bladder tack    . cataract Bilateral 2004  . CHOLECYSTECTOMY  2000  . DILATION AND CURETTAGE OF UTERUS    . DIRECT LARYNGOSCOPY Right 06/16/2017   Procedure: MICRO DIRECT LARYNGOSCOPY WITH BIOPSY OF RIGHT BASE OF TONGUE;  Surgeon: Carloyn Manner, MD;  Location: [facility];  Service: ENT;  Laterality: Right;  . EP IMPLANTABLE DEVICE N/A 06/04/2015   Procedure: PPM Generator Changeout;  Surgeon: Deboraha Sprang, MD;  Location: [place] [facility];  Service: Cardiovascular;  Laterality: N/A;  . ESOPHAGOGASTRODUODENOSCOPY (EGD) WITH PROPOFOL N/A 12/18/2015   Procedure: ESOPHAGOGASTRODUODENOSCOPY (EGD) WITH gastric biopsy and dilation;  Surgeon: Lucilla Lame, MD;  Location: [place];  Service: Endoscopy;  Laterality: N/A;  . EYE SURGERY    . INSERT / REPLACE / REMOVE PACEMAKER    . IR FLUORO GUIDE PORT INSERTION RIGHT  06/22/2017  . IR REMOVAL TUN ACCESS W/ PORT W/O FL MOD SED  04/14/2018  . MOHS SURGERY  02/2018  . PACEMAKER INSERTION  2006   Medtronic Adapta ADDR01  . TONSILLECTOMY    . TONSILLECTOMY    . VAGINAL HYSTERECTOMY  2013   mad/cope    Current Outpatient Medications  Medication Sig Dispense Refill  . aspirin 81 MG tablet Take 81 mg by mouth daily.    Marland Kitchen b complex vitamins tablet Take 1 tablet by mouth daily.    Marland Kitchen BLACK ELDERBERRY,BERRY-FLOWER, PO Take 1 capsule by mouth daily.    . Calcium Carb-Cholecalciferol (CALCIUM 500 + D3 PO) Take 1 Dose by mouth daily.    Marland Kitchen CRANBERRY SOFT PO Take 2 each by mouth daily.     . Digestive Enzymes (DIGESTIVE ENZYME PO) Take 1-2 capsules by mouth daily.    Marland Kitchen levothyroxine (SYNTHROID, LEVOTHROID) 75 MCG tablet Take 1 tablet (75 mcg total) by mouth daily. BRAND NAME SYNTHROID NECESSARY 90 tablet 3  . Loratadine (CLARITIN PO) Take by mouth daily.    . montelukast  (SINGULAIR) 10 MG tablet Take 10 mg by mouth at bedtime.    . nitroGLYCERIN (NITROSTAT) 0.4 MG SL tablet Place 1 tablet (0.4 mg total) under the tongue every 5 (five) minutes as needed for chest pain. 25 tablet 3  . PRESCRIPTION MEDICATION Place 1 tablet vaginally 3 (three) times a week. E3 1mg  and Testosterone 1mg  compound     No current facility-administered medications for this visit.    Facility-Administered Medications Ordered in Other Visits  Medication Dose Route Frequency Provider Last Rate Last Dose  . 0.9 %  sodium chloride infusion   Intravenous Once Sindy Guadeloupe, MD         Allergies:   Cefdinir, Cephalosporins, Dairy aid [lactase], Flagyl [metronidazole], Lactose intolerance (gi), Penicillins, Phenylephrine-guaifenesin, Sulfa antibiotics, Sulfonamide derivatives, Tape, Dexilant [dexlansoprazole], Entex lq [phenylephrine-guaifenesin], Other, Oxycodone, Succinylcholine, Vancomycin, Tetracycline, Codeine, Guaifenesin & derivatives, Lactobacillus, Naproxen sodium, Pseudoephedrine, Tetracyclines & related, and Wheat bran   Social History:  The patient  reports that she has never smoked. She has never used smokeless tobacco. She reports that she does not drink alcohol or use drugs.   Family History:   family history  includes Atrial fibrillation in her mother; Breast cancer (age of onset: 34) in her maternal aunt; COPD in her maternal grandmother and mother; Stroke in her father.    Review of Systems: Review of Systems  Constitutional: Positive for weight loss.  Respiratory: Negative.   Cardiovascular: Negative.   Gastrointestinal: Negative.   Musculoskeletal: Negative.   Neurological: Negative.   Psychiatric/Behavioral: Negative.   All other systems reviewed and are negative.    PHYSICAL EXAM: VS:  There were no vitals taken for this visit. , BMI There is no height or weight on file to calculate BMI. Constitutional:  oriented to person, place, and time. No distress.  HENT:   Head: Normocephalic and atraumatic.  Eyes:  no discharge. No scleral icterus.  Neck: Normal range of motion. Neck supple. No JVD present.  Cardiovascular: Normal rate, regular rhythm, normal heart sounds and intact distal pulses. Exam reveals no gallop and no friction rub. No edema 1/6 SEM RSB   Pulmonary/Chest: Effort normal and breath sounds normal. No stridor. No respiratory distress.  no wheezes.  no rales.  no tenderness.  Abdominal: Soft.  no distension.  no tenderness.  Musculoskeletal: Normal range of motion.  no  tenderness or deformity.  Neurological:  normal muscle tone. Coordination normal. No atrophy Skin: Skin is warm and dry. No rash noted. not diaphoretic.  Psychiatric:  normal mood and affect. behavior is normal. Thought content normal.    Recent Labs: 04/20/2018: ALT 19; BUN 12; Creatinine, Ser 0.81; Potassium 3.6; Sodium 144 09/20/2018: TSH 3.330 11/07/2018: Hemoglobin 12.7; Platelets 135    Lipid Panel Lab Results  Component Value Date   CHOL 162 04/20/2018   HDL 69 04/20/2018   LDLCALC 81 04/20/2018   TRIG 60 04/20/2018      Wt Readings from Last 3 Encounters:  08/15/18 142 lb 12.8 oz (64.8 kg)  07/24/18 143 lb 13.6 oz (65.2 kg)  07/10/18 141 lb 8 oz (64.2 kg)       ASSESSMENT AND PLAN:  Ascending aortic aneurysm ([place]) - Followed in [facility] by CT surgery Aorta has been stable for approximately 10 years She has had CT scanning June 2018, PET/CT November 2018, PET/CT May 2019 All the images above reviewed by myself, difficult to say though likely no significant increase in size She is concerned about having repeat CT scan end of June given 3 recent scans in the past year, and 35 rounds of radiation for cancer We will reach out to CT surgery to see if we could delay repeat scan given the above  Palpitations -   pacemaker in place Overall feels well and  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onic -  Managed by Dr. Caryl Comes, previous  generator change out  Orthostatic  hypotension Exacerbated recently by 40 pound weight loss, during cancer treatment Currently asymptomatic Recommended aggressive hydration, limiting further weight loss  AV block, 2nd degree History of pacemaker  followed by Dr. Caryl Comes   Total encounter time more than 45 minutes  Greater than 50% was spent in counseling and coordination of care with the patient  Disposition:   F/U  12 months   No orders of the defined types were placed in this encounter.    Signed, Esmond Plants, M.D., Ph.D. 01/20/2019  [place], [place]

## 2019-01-22 ENCOUNTER — Telehealth: Payer: Self-pay

## 2019-01-22 NOTE — Telephone Encounter (Signed)
Called patient to review Screening questions. Patient made me aware that her insurance has changed and they she has to reschedule her CT until 01/31/2019 and would like to follow up after that is complete.   Rescheduled her for Aug 10th @ 10:40am.

## 2019-01-23 ENCOUNTER — Ambulatory Visit: Payer: BC Managed Care – PPO | Admitting: Cardiovascular Disease

## 2019-01-31 ENCOUNTER — Ambulatory Visit: Payer: BC Managed Care – PPO | Admitting: Cardiothoracic Surgery

## 2019-02-05 ENCOUNTER — Ambulatory Visit
Admission: RE | Admit: 2019-02-05 | Discharge: 2019-02-05 | Disposition: A | Discharge status: Home / Self Care | Payer: 59 | Source: Ambulatory Visit | Attending: Cardiothoracic Surgery | Admitting: Cardiothoracic Surgery

## 2019-02-05 ENCOUNTER — Other Ambulatory Visit: Payer: Self-pay

## 2019-02-05 DIAGNOSIS — I712 Thoracic aortic aneurysm, without rupture, unspecified: Secondary | ICD-10-CM

## 2019-02-05 MED ORDER — IOPAMIDOL (ISOVUE-370) INJECTION 76%
75.0000 mL | Freq: Once | INTRAVENOUS | Status: AC | PRN
  Administered 2019-02-05: 75 mL via INTRAVENOUS

## 2019-02-14 ENCOUNTER — Ambulatory Visit: Payer: Self-pay | Admitting: Cardiothoracic Surgery

## 2019-02-17 NOTE — Progress Notes (Deleted)
Patient ID: Wendy Kelly, female   DOB: [birth year], 63 y.o.   MRN: [id number] Cardiology Office Note  Date:  02/17/2019   ID:  Wendy Kelly, DOB Dec 01, 1955, MRN [id number]  PCP:  Wendy Roys, DO   No chief complaint on file.   HPI:  Wendy Kelly is a very pleasant 64 year old woman with a history of  anxiety ascending aorta aneurysm estimated at 4.6 to 4.8 cm that has been monitored with echocardiography and CT scans, No significant change since 2010 suspected bicuspid aortic valve on previous echocardiograms (no TEE performed) syncope second-degree AV block and pacemaker placement in November 2006,  episode of TIA-type symptoms with right facial droop, expressive aphasia that resolved,  intermittent chest pain, atypical in nature occasional nausea Chronic orthostasis , which she prefers to manage with fluids, previously Declined medications She presents for routine followup of her chest pain and ascending aorta dilation  CT chest 02/05/2019 Stable dilatation of the ascending thoracic aorta with a maximum transverse diameter 4.8 x 4.8 cm. No evident dissection. Ascending thoracic aortic aneurysm.    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 Currently on xarelto No leg swelling  Fatigue Blood pressure running low, No orthostasis  CTA chest scheduled 12/2017 PET scan with CT November 2018: ascending aorta dilation not measured PET scan with CT May 2019 : ascending aorta 4.3 cm, though significant artifact from her port  in takes care of her 60 year old mother, no other help from family Previous episodes of chest pain, none recently Prior episodes of  tachycardia felt secondary to stress and anxiety, none recently  EKG on today's visit shows normal sinus rhythm with rate 69 bpm, nonspecific ST abnormality  Other past medical history reviewed Ascending thoracic aortic aneurysm now measuring 48 mm compared to 46 mm in 2015.    Echocardiogram January 2018 Aortic root: The aortic root was dilated at 4.2cm. - Ascending aorta: The ascending aorta was dilated at 4.6cm. No significant change  Last CT chest December 2015, aorta 4.6 cm at that time No change compared to CT scan August 2011, at that time 4.6 x 4.8 cm  Son recently diagnosed with dilated ascending aorta 4.2 cm, he is out of state  She has some atypical chest pain, improved symptoms with accupuncture and chiropractic Some issues with swallowing  CT scan neck reviewed images pulled up in the office today no significant carotid disease, aortic arch disease/calcification  She was seen in December 2015 in the hospital for chest pain. Echocardiogram was unchanged, chest pain felt to be noncardiac. Echocardiogram May and December 2015, relatively no change.   CT scan in November 2013. 4.7 cm ascending aorta at that time  She has had low potassium in the past on visits to the emergency room for chest pain  Last stress test was April 2013 performed for chest pain. Equivocal study, no large regions of ischemia, some attenuation artifact noted.  episode of tachycardia followed by chest pain 05/18/2012. Workup in the hospital was essentially negative. She had workup including carotid ultrasound that showed no significant disease.   Previous lab work shows total cholesterol 151, LDL 84, HDL 55, TSH 1.8  PMH:   has a past medical history of AA (aortic aneurysm) ([place]), Anxiety, AV block, 2nd degree, Bicuspid aortic valve, Cancer ([place]), Chest pain, Dehydration (08/11/2017), Depression, Expressive aphasia, Facial numbness, GERD (gastroesophageal reflux disease), Low blood pressure,  Personal history of chemotherapy (2018-2019), Personal history of radiation therapy (2018-2019), Presence of permanent cardiac pacemaker, Squamous cell carcinoma (05/2017), Syncope, and Thyroid disease.  PSH:    Past Surgical History:  Procedure Laterality Date  . ABDOMINAL WALL MESH  REMOVAL    . bladder tack    . cataract Bilateral 2004  . CHOLECYSTECTOMY  2000  . DILATION AND CURETTAGE OF UTERUS    . DIRECT LARYNGOSCOPY Right 06/16/2017   Procedure: MICRO DIRECT LARYNGOSCOPY WITH BIOPSY OF RIGHT BASE OF TONGUE;  Surgeon: Wendy Manner, MD;  Location: [facility];  Service: ENT;  Laterality: Right;  . EP IMPLANTABLE DEVICE N/A 06/04/2015   Procedure: PPM Generator Changeout;  Surgeon: Wendy Sprang, MD;  Location: [place] [facility];  Service: Cardiovascular;  Laterality: N/A;  . ESOPHAGOGASTRODUODENOSCOPY (EGD) WITH PROPOFOL N/A 12/18/2015   Procedure: ESOPHAGOGASTRODUODENOSCOPY (EGD) WITH gastric biopsy and dilation;  Surgeon: Lucilla Lame, MD;  Location: [place];  Service: Endoscopy;  Laterality: N/A;  . EYE SURGERY    . INSERT / REPLACE / REMOVE PACEMAKER    . IR FLUORO GUIDE PORT INSERTION RIGHT  06/22/2017  . IR REMOVAL TUN ACCESS W/ PORT W/O FL MOD SED  04/14/2018  . MOHS SURGERY  02/2018  . PACEMAKER INSERTION  2006   Medtronic Adapta ADDR01  . TONSILLECTOMY    . TONSILLECTOMY    . VAGINAL HYSTERECTOMY  2013   mad/cope    Current Outpatient Medications  Medication Sig Dispense Refill  . aspirin 81 MG tablet Take 81 mg by mouth daily.    Marland Kitchen b complex vitamins tablet Take 1 tablet by mouth daily.    Marland Kitchen BLACK ELDERBERRY,BERRY-FLOWER, PO Take 1 capsule by mouth daily.    . Calcium Carb-Cholecalciferol (CALCIUM 500 + D3 PO) Take 1 Dose by mouth daily.    Marland Kitchen CRANBERRY SOFT PO Take 2 each by mouth daily.     . Digestive Enzymes (DIGESTIVE ENZYME PO) Take 1-2 capsules by mouth daily.    Marland Kitchen levothyroxine (SYNTHROID, LEVOTHROID) 75 MCG tablet Take 1 tablet (75 mcg total) by  mouth daily. BRAND NAME SYNTHROID NECESSARY 90 tablet 3  . Loratadine (CLARITIN PO) Take by mouth daily.    . montelukast (SINGULAIR) 10 MG tablet Take 10 mg by mouth at bedtime.    . nitroGLYCERIN (NITROSTAT) 0.4 MG SL tablet Place 1 tablet (0.4 mg total) under the tongue every 5 (five) minutes as needed for chest pain. 25 tablet 3  . PRESCRIPTION MEDICATION Place 1 tablet vaginally 3 (three) times a week. E3 1mg  and Testosterone 1mg  compound     No current facility-administered medications for this visit.    Facility-Administered Medications Ordered in Other Visits  Medication Dose Route Frequency Provider Last Rate Last Dose  . 0.9 %  sodium chloride infusion   Intravenous Once Sindy Guadeloupe, MD         Allergies:   Cefdinir, Cephalosporins, Dairy aid [lactase], Flagyl [metronidazole], Lactose intolerance (gi), Penicillins, Phenylephrine-guaifenesin, Sulfa antibiotics, Sulfonamide derivatives, Tape, Dexilant [dexlansoprazole], Entex lq [phenylephrine-guaifenesin], Other, Oxycodone, Succinylcholine, Vancomycin, Tetracycline, Codeine, Guaifenesin & derivatives, Lactobacillus, Naproxen sodium, Pseudoephedrine, Tetracyclines & related, and Wheat bran   Social History:  The patient  reports that she has never smoked. She has never used smokeless  tobacco. She reports that she does not drink alcohol or use drugs.   Family History:   family history includes Atrial fibrillation in her mother; Breast cancer (age of onset: 62) in her maternal aunt; COPD in her maternal grandmother and mother; Stroke in her father.    Review of Systems: Review of Systems  Constitutional: Positive for weight loss.  Respiratory: Negative.   Cardiovascular: Negative.   Gastrointestinal: Negative.   Musculoskeletal: Negative.   Neurological: Negative.   Psychiatric/Behavioral: Negative.   All other systems reviewed and are negative.    PHYSICAL EXAM: VS:  There were no vitals taken for this visit. , BMI There  is no height or weight on file to calculate BMI. Constitutional:  oriented to person, place, and time. No distress.  HENT:  Head: Normocephalic and atraumatic.  Eyes:  no discharge. No scleral icterus.  Neck: Normal range of motion. Neck supple. No JVD present.  Cardiovascular: Normal rate, regular rhythm, normal heart sounds and intact distal pulses. Exam reveals no gallop and no friction rub. No edema 1/6 SEM RSB   Pulmonary/Chest: Effort normal and breath sounds normal. No stridor. No respiratory distress.  no wheezes.  no rales.  no tenderness.  Abdominal: Soft.  no distension.  no tenderness.  Musculoskeletal: Normal range of motion.  no  tenderness or deformity.  Neurological:  normal muscle tone. Coordination normal. No atrophy Skin: Skin is warm and dry. No rash noted. not diaphoretic.  Psychiatric:  normal mood and affect. behavior is normal. Thought content normal.    Recent Labs: 04/20/2018: ALT 19; BUN 12; Creatinine, Ser 0.81; Potassium 3.6; Sodium 144 09/20/2018: TSH 3.330 11/07/2018: Hemoglobin 12.7; Platelets 135    Lipid Panel Lab Results  Component Value Date   CHOL 162 04/20/2018   HDL 69 04/20/2018   LDLCALC 81 04/20/2018   TRIG 60 04/20/2018      Wt Readings from Last 3 Encounters:  08/15/18 142 lb 12.8 oz (64.8 kg)  07/24/18 143 lb 13.6 oz (65.2 kg)  07/10/18 141 lb 8 oz (64.2 kg)       ASSESSMENT AND PLAN:  Ascending aortic aneurysm ([place]) - Followed in [facility] by CT surgery Aorta has been stable for approximately 10 years She has had CT scanning June 2018, PET/CT November 2018, PET/CT May 2019 All the images above reviewed by myself, difficult to say though likely no significant increase in size She is concerned about having repeat CT scan end of June given 3 recent scans in the past year, and 35 rounds of radiation for cancer We will reach out to CT surgery to see if we could delay repeat scan given the above  Palpitations -   pacemaker in  place Overall feels well and  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onic -  Managed by Dr. Caryl Comes, previous  generator change out  Orthostatic hypotension Exacerbated recently by 40 pound weight loss, during cancer treatment Currently asymptomatic Recommended aggressive hydration, limiting further weight loss  AV block, 2nd degree History of pacemaker  followed by Dr. Caryl Comes   Total encounter time more than 45 minutes  Greater than 50% was spent in counseling and coordination of care with the patient  Disposition:   F/U  12 months   No orders of the defined types were placed in this encounter.    Signed, Esmond Plants, M.D., Ph.D. 02/17/2019  [place], [place]

## 2019-02-19 ENCOUNTER — Telehealth (INDEPENDENT_AMBULATORY_CARE_PROVIDER_SITE_OTHER): Payer: 59 | Admitting: Cardiovascular Disease

## 2019-02-19 ENCOUNTER — Ambulatory Visit: Payer: 59 | Admitting: Cardiovascular Disease

## 2019-02-19 ENCOUNTER — Telehealth: Payer: Self-pay | Admitting: Cardiovascular Disease

## 2019-02-19 ENCOUNTER — Encounter: Payer: Self-pay | Admitting: Cardiovascular Disease

## 2019-02-19 ENCOUNTER — Other Ambulatory Visit: Payer: Self-pay

## 2019-02-19 VITALS — Ht 63.0 in | Wt 140.0 lb

## 2019-02-19 DIAGNOSIS — I480 Paroxysmal atrial fibrillation: Secondary | ICD-10-CM

## 2019-02-19 DIAGNOSIS — I441 Atrioventricular block, second degree: Secondary | ICD-10-CM

## 2019-02-19 DIAGNOSIS — I951 Orthostatic hypotension: Secondary | ICD-10-CM

## 2019-02-19 DIAGNOSIS — I4891 Unspecified atrial fibrillation: Secondary | ICD-10-CM

## 2019-02-19 DIAGNOSIS — Z950 Presence of cardiac pacemaker: Secondary | ICD-10-CM

## 2019-02-19 DIAGNOSIS — I712 Thoracic aortic aneurysm, without rupture: Secondary | ICD-10-CM | POA: Diagnosis not present

## 2019-02-19 DIAGNOSIS — I7121 Aneurysm of the ascending aorta, without rupture: Secondary | ICD-10-CM

## 2019-02-19 DIAGNOSIS — R55 Syncope and collapse: Secondary | ICD-10-CM

## 2019-02-19 NOTE — Telephone Encounter (Deleted)
If able, please record your weight, heart rate, and blood pressure for Dr. Rockey Situ to review.      FULL LENGTH CONSENT FOR TELE-HEALTH VISIT   I hereby voluntarily request, consent and authorize [facility] and its employed or contracted physicians, physician assistants, nurse practitioners or other licensed health care professionals (the Practitioner), to provide me with telemedicine health care services (the Services") as deemed necessary by the treating Practitioner. I acknowledge and consent to receive the Services by the Practitioner via telemedicine. I understand that the telemedicine visit will involve communicating with the Practitioner through live audiovisual communication technology and the disclosure of certain medical information by electronic transmission. I acknowledge that I have been given the opportunity to request an in-person assessment or other available alternative prior to the telemedicine visit and am voluntarily participating in the telemedicine visit.  I understand that I have the right to withhold or withdraw my consent to the use of telemedicine in the course of my care at any time, without affecting my right to future care or treatment, and that the Practitioner or I may terminate the telemedicine visit at any time. I understand that I have the right to inspect all information obtained and/or recorded in the course of the telemedicine visit and may receive copies of available information for a reasonable fee.  I understand that some of the potential risks of receiving the Services via telemedicine include:   Delay or interruption in medical evaluation due to technological equipment failure or disruption;  Information transmitted may not be sufficient (e.g. poor resolution of images) to allow for appropriate medical decision making by the Practitioner; and/or   In rare instances, security protocols could fail, causing a breach of personal health  information.  Furthermore, I acknowledge that it is my responsibility to provide information about my medical history, conditions and care that is complete and accurate to the best of my ability. I acknowledge that Practitioner's advice, recommendations, and/or decision may be based on factors not within their control, such as incomplete or inaccurate data provided by me or distortions of diagnostic images or specimens that may result from electronic transmissions. I understand that the practice of medicine is not an exact science and that Practitioner makes no warranties or guarantees regarding treatment outcomes. I acknowledge that I will receive a copy of this consent concurrently upon execution via email to the email address I last provided but may also request a printed copy by calling the office of [facility].    I understand that my insurance will be billed for this visit.   I have read or had this consent read to me.  I understand the contents of this consent, which adequately explains the benefits and risks of the Services being provided via telemedicine.   I have been provided ample opportunity to ask questions regarding this consent and the Services and have had my questions answered to my satisfaction.  I give my informed consent for the services to be provided through the use of telemedicine in my medical care  By participating in this telemedicine visit I agree to the above.

## 2019-02-19 NOTE — Telephone Encounter (Signed)
Pt c/o Shortness Of Breath: STAT if SOB developed within the last 24 hours or pt is noticeably SOB on the phone  1. Are you currently SOB (can you hear that pt is SOB on the phone)? no  2. How long have you been experiencing SOB? 02/10/19  3. Are you SOB when sitting or when up moving around? When she bends mostly  4. Are you currently experiencing any other symptoms? Feels SOB in sternum/zipyoid area (lower than chest), upon exertion she has been light headed, occassional lightheadedness (since previous Saturday)   Patient uses short of breath but says that she at the same time is not short of breath  Patient is upset that she had to cancel appointment in office and was not given the oppurtunity to do a virtual visit with Dr. Rockey Situ. Patient would like a call back to advise her "lower shortness of breath"

## 2019-02-19 NOTE — Progress Notes (Signed)
Virtual Visit via Video Note   This visit type was conducted due to national recommendations for restrictions regarding the COVID-19 Pandemic (e.g. social distancing) in an effort to limit this patient's exposure and mitigate transmission in our community.  Due to her co-morbid illnesses, this patient is at least at moderate risk for complications without adequate follow up.  This format is felt to be most appropriate for this patient at this time.  All issues noted in this document were discussed and addressed.  A limited physical exam was performed with this format.  Please refer to the patient's chart for her consent to telehealth for [facility].   I connected with  Wendy Kelly on 02/19/19 by a video enabled telemedicine application and verified that I am speaking with the correct person using two identifiers. I discussed the limitations of evaluation and management by telemedicine. The patient expressed understanding and agreed to proceed.   Evaluation Performed:  Follow-up visit  Date:  02/19/2019   ID:  Wendy Kelly, DOB 1955-11-23, MRN [id number]  Patient Location:  [place] [address]   Provider location:   [facility], [place] office  PCP:  Wendy Kelly  Cardiologist:  Wendy Kelly   Chief Complaint:  SOB   History of Present Illness:    Wendy Kelly is a 63 y.o. female who presents via audio/video conferencing for a telehealth visit today.   The patient does not symptoms concerning for COVID-19 infection (fever, chills, cough, or new SHORTNESS OF BREATH).    HPI:  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 that has been monitored with echocardiography and CT scans, No significant change since 2010 suspected bicuspid aortic valve on previous echocardiograms (no TEE performed) syncope second-degree AV block and pacemaker placement  in November 2006,  episode of TIA-type symptoms with right facial droop, expressive aphasia that resolved,  intermittent chest pain, atypical in nature occasional nausea Chronic orthostasis , which she prefers to manage with fluids, previously Declined medications She presents for routine followup of her chest pain and ascending aorta dilation  CT chest 02/05/2019 Stable dilatation of the ascending thoracic aorta with a maximum transverse diameter 4.8 x 4.8 cm. No evident dissection. Ascending thoracic aortic aneurysm.  Around 1 week ago reports that she spent several hours outdoors in the hot sun Bent over then stood up, felt dizzy consistent with orthostasis Started drinking fluids Took an hour to fully recover  Interestingly since then she has had periods of shortness of breath that seem to emanate from her lower lung field, xiphoid area She has been able to continue aerobic activity but has periodic shortness of breath that seems to come and go -Unable to exclude palpitations  Reports that she is completed her cancer treatment, clear for 1-1/2 years  On last clinic visit she reported:  lymph node right neck concerning for carcinoma on PET scan 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  Treated with Xarelto  CTA chest scheduled 12/2017 PET scan with CT November 2018: ascending aorta dilation not measured PET scan with CT May 2019 : ascending aorta 4.3 cm, though significant artifact from  her port  in takes care of her 64 year old mother, no other help from family Previous episodes of chest pain, none recently Prior episodes of tachycardia felt secondary to stress and anxiety, none recently  Ascending thoracic aortic aneurysm now measuring 48 mm compared to 46 mm in  2015.    Echocardiogram January 2018 Aortic root: The aortic root was dilated at 4.2cm. - Ascending aorta: The ascending aorta was dilated at 4.6cm. No significant change  Last CT chest December 2015, aorta 4.6 cm at that time No change compared to CT scan August 2011, at that time 4.6 x 4.8 cm  Son diagnosed with dilated ascending aorta 4.2 cm, he is out of state  CT scan in November 2013. 4.7 cm ascending aorta at that time  episode of tachycardia followed by chest pain 05/18/2012. Workup in the hospital was essentially negative. She had workup including carotid ultrasound that showed no significant disease.    Past Medical History:  Diagnosis Date  . AA (aortic aneurysm) ([facility])    a. 04/2011 Echo: Ao Root: 4.1cm, Asc Ao 4.7cm.  . Anxiety   . AV block, 2nd degree    a. 05/2005 - s/p MDT Adapta ADDR01 Dual Chamber PPM  . Bicuspid aortic valve   . Cancer ([facility] [place])    Throat  . Chest pain    a. 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  . Dehydration 08/11/2017  . Depression   . Expressive aphasia    a. ongoing since 04/2011 - seen by neurology - ? TIA vs. Migraine  . Facial numbness    a. ongoing since 04/2011 - seen by neurology - ? TIA vs. Migraine  . GERD (gastroesophageal reflux disease)   . Low blood pressure   . Personal history of chemotherapy 2018-2019   Throat  . Personal history of radiation therapy 2018-2019   Throat  . Presence of permanent cardiac pacemaker   . 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 Syncope    a. 04/2011 Echo: EF 55-65%, No RWMA, Gr 1 DD.  Marland Kitchen Thyroid disease    Past Surgical History:  Procedure Laterality Date  . ABDOMINAL WALL MESH  REMOVAL    . bladder tack    . cataract Bilateral 2004  . CHOLECYSTECTOMY  2000  . DILATION AND CURETTAGE OF UTERUS    . DIRECT LARYNGOSCOPY Right 06/16/2017   Procedure: MICRO DIRECT LARYNGOSCOPY WITH BIOPSY OF RIGHT BASE OF TONGUE;  Surgeon: Carloyn Manner, MD;  Location: [facility] ORS;  Service:  ENT;  Laterality: Right;  . EP IMPLANTABLE DEVICE N/A 06/04/2015   Procedure: PPM Generator Changeout;  Surgeon: Deboraha Sprang, MD;  Location: [facility];  Service: Cardiovascular;  Laterality: N/A;  . ESOPHAGOGASTRODUODENOSCOPY (EGD) WITH PROPOFOL N/A 12/18/2015   Procedure: ESOPHAGOGASTRODUODENOSCOPY (EGD) WITH gastric biopsy and dilation;  Surgeon: Lucilla Lame, MD;  Location: [facility];  Service: Endoscopy;  Laterality: N/A;  . EYE SURGERY    . INSERT / REPLACE / REMOVE PACEMAKER    . IR FLUORO GUIDE PORT INSERTION RIGHT  06/22/2017  . IR REMOVAL TUN ACCESS W/ PORT W/O FL MOD SED  04/14/2018  . MOHS SURGERY  02/2018  . PACEMAKER INSERTION  2006   Medtronic Adapta ADDR01  . TONSILLECTOMY    . TONSILLECTOMY    . VAGINAL HYSTERECTOMY  2013   mad/cope      Allergies:   Cefdinir, Cephalosporins, Dairy aid [lactase], Flagyl [metronidazole], Lactose intolerance (gi), Penicillins, Phenylephrine-guaifenesin, Sulfa antibiotics,  Sulfonamide derivatives, Tape, Dexilant [dexlansoprazole], Entex lq [phenylephrine-guaifenesin], Other, Oxycodone, Succinylcholine, Vancomycin, Tetracycline, Codeine, Guaifenesin & derivatives, Lactobacillus, Naproxen sodium, Pseudoephedrine, Tetracyclines & related, and Wheat bran   Social History   Tobacco Use  . Smoking status: Never Smoker  . Smokeless tobacco: Never Used  . Tobacco comment: tobacco use -no  Substance Use Topics  . Alcohol use: No  . Drug use: No     Current Outpatient Medications on File Prior to Visit  Medication Sig Dispense Refill  . aspirin 81 MG tablet Take 81 mg by mouth daily.    Marland Kitchen b complex vitamins tablet Take 2 tablets by mouth daily.     Marland Kitchen BLACK ELDERBERRY,BERRY-FLOWER, PO Take 1 capsule by mouth daily.    . Calcium Carb-Cholecalciferol (CALCIUM 500 + D3 PO) Take 1 Dose by mouth daily.    Marland Kitchen CRANBERRY SOFT PO Take 2 each by mouth daily.     . Digestive Enzymes (DIGESTIVE ENZYME PO) Take 1-2 capsules by mouth  daily.    Marland Kitchen levothyroxine (SYNTHROID, LEVOTHROID) 75 MCG tablet Take 1 tablet (75 mcg total) by mouth daily. BRAND NAME SYNTHROID NECESSARY 90 tablet 3  . Loratadine (CLARITIN PO) Take by mouth daily.    . montelukast (SINGULAIR) 10 MG tablet Take 10 mg by mouth at bedtime.    . nitroGLYCERIN (NITROSTAT) 0.4 MG SL tablet Place 1 tablet (0.4 mg total) under the tongue every 5 (five) minutes as needed for chest pain. 25 tablet 3  . PRESCRIPTION MEDICATION Place 1 tablet vaginally 3 (three) times a week. E3 1mg  and Testosterone 1mg  compound     Current Facility-Administered Medications on File Prior to Visit  Medication Dose Route Frequency Provider Last Rate Last Dose  . 0.9 %  sodium chloride infusion   Intravenous Once Sindy Guadeloupe, MD         Family Hx: The patient's family history includes Atrial fibrillation in her mother; Breast cancer (age of onset: 79) in her maternal aunt; COPD in her maternal grandmother and mother; Stroke in her father. There is no history of Ovarian cancer or Diabetes.  ROS:   Please see the history of present illness.    Review of Systems  Constitutional: Negative.   HENT: Negative.   Respiratory: Negative.   Cardiovascular: Negative.   Gastrointestinal: Negative.   Musculoskeletal: Negative.   Neurological: Negative.   Psychiatric/Behavioral: Negative.   All other systems reviewed and are negative.    Labs/Other Tests and Data Reviewed:    Recent Labs: 04/20/2018: ALT 19; BUN 12; Creatinine, Ser 0.81; Potassium 3.6; Sodium 144 09/20/2018: TSH 3.330 11/07/2018: Hemoglobin 12.7; Platelets 135   Recent Lipid Panel Lab Results  Component Value Date/Time   CHOL 162 04/20/2018 08:43 AM   TRIG 60 04/20/2018 08:43 AM   HDL 69 04/20/2018 08:43 AM   CHOLHDL 2.8 06/30/2014 05:47 PM   LDLCALC 81 04/20/2018 08:43 AM    Wt Readings from Last 3 Encounters:  02/19/19 140 lb (63.5 kg)  08/15/18 142 lb 12.8 oz (64.8 kg)  07/24/18 143 lb 13.6 oz (65.2 kg)      Exam:    Vital Signs: Vital signs may also be detailed in the HPI Ht 5\' 3"  (1.6 m)   Wt 140 lb (63.5 kg)   BMI 24.80 kg/m   Wt Readings from Last 3 Encounters:  02/19/19 140 lb (63.5 kg)  08/15/18 142 lb 12.8 oz (64.8 kg)  07/24/18 143 lb 13.6 oz (65.2 kg)   Temp Readings from  Last 3 Encounters:  09/26/18 98.5 F (36.9 C) (Oral)  08/15/18 98.3 F (36.8 C) (Oral)  07/24/18 (!) 96.1 F (35.6 C) (Tympanic)   BP Readings from Last 3 Encounters:  09/26/18 118/75  08/15/18 102/66  07/24/18 109/76   Pulse Readings from Last 3 Encounters:  09/26/18 68  08/15/18 69  07/24/18 72     Well nourished, well developed female in no acute distress. Constitutional:  oriented to person, place, and time. No distress.  Head: Normocephalic and atraumatic.  Eyes:  no discharge. No scleral icterus.  Neck: Normal range of motion. Neck supple.  Pulmonary/Chest: No audible wheezing, no distress, appears comfortable Musculoskeletal: Normal range of motion.  no  tenderness or deformity.  Neurological:   Coordination normal. Full exam not performed Skin:  No rash Psychiatric:  normal mood and affect. behavior is normal. Thought content normal.    ASSESSMENT & PLAN:    Problem List Items Addressed This Visit      Cardiology Problems   Atrial fibrillation (HCC) - Primary   AV block, 2nd degree   Orthostatic hypotension   Ascending aortic aneurysm (HCC)   SYNCOPE AND COLLAPSE     Other   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onic     Recent CT scan results discussed with her,  stable aorta size 4.8 cm Typically with low blood pressure, orthostatic symptoms Recommend she stay hydrated Avoid getting overheated in the hot sun  Shortness of breath Symptoms over the past week seem to be triggered by her orthostatic episode Etiology unclear, otherwise has maintained good aerobic tolerance over the past week Denies fluid retention, weight gain, abdominal bloating Possible palpitations Recommend early  pacemaker download for further evaluation   COVID-19 Education: The signs and symptoms of COVID-19 were discussed with the patient and how to seek care for testing (follow up with PCP or arrange E-visit).  The importance of social distancing was discussed today.  Patient Risk:   After full review of this patients clinical status, I feel that they are at least moderate risk at this time.  Time:   Today, I have spent 25 minutes with the patient with telehealth technology discussing the cardiac and medical problems/diagnoses detailed above   Additional 10 min spent reviewing the chart prior to patient visit today   Medication Adjustments/Labs and Tests Ordered: Current medicines are reviewed at length with the patient today.  Concerns regarding medicines are outlined above.   Tests Ordered: No tests ordered   Medication Changes: No changes made   Disposition: Follow-up in 12 months   Signed, Ida Rogue, MD  [facility] [address] [place] [address], [place], [place] [address]

## 2019-02-19 NOTE — Telephone Encounter (Signed)
Scheduled today with [place] for telephone visit.   Closing encounter

## 2019-02-20 ENCOUNTER — Telehealth: Payer: Self-pay | Admitting: Internal Medicine

## 2019-02-20 NOTE — Telephone Encounter (Signed)
I asked the pt to send a manual transmission with her home monitor. Pt states she is at work and will not be able to send the transmission until in the morning. I told her as soon as I see the transmission I will let the nurse know then the nurse will give her a call back.

## 2019-02-20 NOTE — Telephone Encounter (Signed)
°  1. Has your device fired? Unknown  2. Is you device beeping? unknown  3. Are you experiencing draining or swelling at device site? unknown  4. Are you calling to see if we received your device transmission? no  5. Have you passed out? No  Patient states she was told by Dr. Rockey Situ current issues could be related to device and wants to know if she can check device.       Please route to [place]

## 2019-02-20 NOTE — Patient Instructions (Signed)
Medication Instructions:  No changes  If you need a refill on your cardiac medications before your next appointment, please call your pharmacy.   Lab work: No changes  If you have labs (blood work) drawn today and your tests are completely normal, you will receive your results only by: Marland Kitchen MyChart Message (if you have MyChart) OR . A paper copy in the mail If you have any lab test that is abnormal or we need to change your treatment, we will call you to review the results.  Testing/Procedures: None  Follow-Up: At [facility], you and your health needs are our priority.  As part of our continuing mission to provide you with exceptional heart care, we have created designated Provider Care Teams.  These Care Teams include your primary Cardiologist (physician) and Advanced Practice Providers (APPs -  Physician Assistants and Nurse Practitioners) who all work together to provide you with the care you need, when you need it. You will need a follow up appointment in 1 years.  Please call our office 2 months in advance to schedule this appointment.  You may see Dr. Ida Rogue or one of the following Advanced Practice Providers on your designated Care Team:   Murray Hodgkins, NP Christell Faith, PA-C . Marrianne Mood, PA-C  Any Other Special Instructions Will Be Listed Below (If Applicable).

## 2019-02-21 NOTE — Telephone Encounter (Signed)
Transmission received.

## 2019-02-21 NOTE — Telephone Encounter (Signed)
Transmission reviewed. Normal PPM function. Battery and lead trends stable. <0.1% AT/AF burden, most recent episodes occurred on 01/02/19, EGMs show 1:1 SVT with PACs, max A rate 170bpm. Longest AMS episode occurred on 10/14/18, previously reviewed on 12/12/18 scheduled remote. No VHR episodes.  Pt reports episode of extreme lightheadedness earlier this month. Thought she was dehydrated and overheated, so she drank a lot of water. Episode took about an hour to resolve. Pt saw Dr. Rockey Situ on 02/19/19 via virtual visit to discuss this episode. Explained that PPM function is normal, no episodes to correlate with symptoms. Advised I will route this message to Dr. Caryl Comes for review and recommendations. Pt is agreeable to this plan and denies questions at this time.

## 2019-02-23 DIAGNOSIS — Z85818 Personal history of malignant neoplasm of other sites of lip, oral cavity, and pharynx: Secondary | ICD-10-CM | POA: Diagnosis not present

## 2019-02-26 NOTE — Telephone Encounter (Signed)
Will continue to follow based on symptoms

## 2019-02-28 ENCOUNTER — Other Ambulatory Visit: Payer: Self-pay

## 2019-02-28 ENCOUNTER — Ambulatory Visit (INDEPENDENT_AMBULATORY_CARE_PROVIDER_SITE_OTHER): Payer: 59 | Admitting: Cardiothoracic Surgery

## 2019-02-28 ENCOUNTER — Encounter: Payer: Self-pay | Admitting: Cardiothoracic Surgery

## 2019-02-28 VITALS — BP 104/59 | HR 63 | Temp 97.3°F | Resp 16 | Ht 63.0 in | Wt 140.0 lb

## 2019-02-28 DIAGNOSIS — I712 Thoracic aortic aneurysm, without rupture, unspecified: Secondary | ICD-10-CM

## 2019-02-28 NOTE — Telephone Encounter (Signed)
Patient made aware, no questions at this time.

## 2019-02-28 NOTE — Progress Notes (Signed)
PCP is Valerie Roys, DO Referring Provider is Minna Merritts, MD  Chief Complaint  Patient presents with  . 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  The patient had an echocardiogram 2018 showing a trileaflet aortic valve without AI and good LV function.  Patient is not a smoker and does not have high blood pressure.  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  She has recovered fairly well.   Past Medical History:  Diagnosis Date  . AA (aortic aneurysm) ([place])    a. 04/2011 Echo: Ao Root: 4.1cm, Asc Ao 4.7cm.  . Anxiety   . AV block, 2nd degree    a. 05/2005 - s/p MDT Adapta ADDR01 Dual Chamber PPM  . Bicuspid aortic valve   . Cancer ([place])    Throat  . Chest pain    a. 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  . Dehydration 08/11/2017  . Depression   . Expressive aphasia    a. ongoing since 04/2011 - seen by neurology - ? TIA vs. Migraine  . Facial numbness    a. ongoing since 04/2011 - seen by neurology - ? TIA vs. Migraine  . GERD (gastroesophageal reflux disease)   . Low blood pressure   . Personal history of chemotherapy 2018-2019   Throat  . Personal history of radiation therapy 2018-2019   Throat  . Presence of permanent cardiac pacemaker   . 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 Syncope    a. 04/2011 Echo: EF 55-65%, No RWMA, Gr 1 DD.  Marland Kitchen Thyroid disease     Past Surgical History:  Procedure Laterality Date  . ABDOMINAL WALL MESH  REMOVAL    . bladder tack    . cataract Bilateral 2004  . CHOLECYSTECTOMY  2000  . DILATION AND CURETTAGE OF UTERUS    . DIRECT LARYNGOSCOPY Right 06/16/2017   Procedure: MICRO DIRECT LARYNGOSCOPY WITH BIOPSY OF RIGHT BASE OF TONGUE;  Surgeon: Carloyn Manner, MD;  Location: [facility];  Service: ENT;  Laterality: Right;  . EP IMPLANTABLE DEVICE N/A 06/04/2015   Procedure: PPM Generator Changeout;  Surgeon: Deboraha Sprang, MD;  Location: [facility];  Service: Cardiovascular;  Laterality: N/A;  . ESOPHAGOGASTRODUODENOSCOPY (EGD) WITH PROPOFOL N/A 12/18/2015   Procedure: ESOPHAGOGASTRODUODENOSCOPY (EGD) WITH gastric biopsy and dilation;  Surgeon: Lucilla Lame, MD;  Location: [facility];  Service: Endoscopy;  Laterality: N/A;  . EYE SURGERY    . INSERT / REPLACE / REMOVE PACEMAKER    . IR FLUORO GUIDE PORT INSERTION RIGHT  06/22/2017  . IR REMOVAL TUN ACCESS W/ PORT W/O FL MOD SED  04/14/2018  . MOHS SURGERY  02/2018  . PACEMAKER INSERTION  2006   [employer] Adapta ADDR01  . TONSILLECTOMY    . TONSILLECTOMY    . VAGINAL HYSTERECTOMY  2013   mad/cope    Family History  Problem Relation Age of Onset  . COPD Mother        alive @ 73  . Atrial fibrillation Mother   . Stroke Father        died @ 45  . Breast cancer Maternal Aunt 70  . COPD Maternal Grandmother   . Ovarian cancer Neg Hx   . Diabetes Neg Hx     Social History Social History   Tobacco  Use  . Smoking status: Never Smoker  . Smokeless tobacco: Never Used  . Tobacco comment: tobacco use -no  Substance Use Topics  . Alcohol use: No  . Drug use: No    Current Outpatient Medications  Medication Sig Dispense Refill  . aspirin 81 MG tablet Take 81 mg by mouth daily.    Marland Kitchen b complex vitamins tablet Take 2 tablets by mouth daily.     Marland Kitchen BLACK ELDERBERRY,BERRY-FLOWER, PO Take 1 capsule by mouth daily.    . Calcium Carb-Cholecalciferol (CALCIUM 500 + D3 PO) Take 1 Dose by mouth daily.    Marland Kitchen CRANBERRY SOFT PO Take 2 each by mouth daily.     . Digestive Enzymes (DIGESTIVE ENZYME PO) Take 1-2 capsules by mouth daily.    Marland Kitchen levothyroxine (SYNTHROID, LEVOTHROID) 75 MCG tablet Take 1 tablet (75 mcg total) by mouth daily. BRAND NAME SYNTHROID NECESSARY 90 tablet 3  . Loratadine (CLARITIN PO) Take by mouth daily.    . montelukast (SINGULAIR) 10 MG tablet Take 10 mg by mouth at  bedtime.    . nitroGLYCERIN (NITROSTAT) 0.4 MG SL tablet Place 1 tablet (0.4 mg total) under the tongue every 5 (five) minutes as needed for chest pain. 25 tablet 3  . PRESCRIPTION MEDICATION Place 1 tablet vaginally 3 (three) times a week. E3 1mg  and Testosterone 1mg  compound     No current facility-administered medications for this visit.    Facility-Administered Medications Ordered in Other Visits  Medication Dose Route Frequency Provider Last Rate Last Dose  . 0.9 %  sodium chloride infusion   Intravenous Once Sindy Guadeloupe, MD        Allergies  Allergen Reactions  . Cefdinir Swelling    Swelling in mouth   . Cephalosporins Swelling  . Dairy Aid [Lactase] Swelling    Milk and milk products cause swelling and tingling of tongue  . Flagyl [Metronidazole] Swelling    Mouth and throat and ears go red and burn  . Lactose Intolerance (Gi) Other (See Comments)    GI upset.  Eats only gluten free  . Penicillins Rash    Rash/hives  . Phenylephrine-Guaifenesin Anaphylaxis    Night terrors  . Sulfa Antibiotics Rash and Hives    Other reaction(s): Unknown   . Sulfonamide Derivatives Rash    Rash/itching.  From head to her toes, the itching was horrible. Penicillin is the worst  . Tape Rash    Adhesives all cause a problem severely.  It doesn't matter if it is paper tape or not.  Marland Kitchen Dexilant [Dexlansoprazole]     Pressure in head like head is in a vice and being squeezed  . Entex Lq [Phenylephrine-Guaifenesin]     Night terrors  . Other Other (See Comments)    Flu vaccine:  Weakness, dizziness, tia type symptoms.    . Oxycodone Nausea And Vomiting    And itching  . Succinylcholine Other (See Comments)    Trouble waking up  . Vancomycin Rash  . Tetracycline Other (See Comments)    Unkown  . Codeine Nausea And Vomiting  . Guaifenesin & Derivatives Other (See Comments)    Night terrors  . Lactobacillus Itching  . Naproxen Sodium     Itching/rash  . Pseudoephedrine Rash  .  Tetracyclines & Related Other (See Comments)    Was taking flagyl at the same time; unsure which caused the swelling  Swelling of throat. Pharmacist states that he believes it was the flagyl and not the  tetracycline  . Wheat Bran Other (See Comments)    Upset stomach. Now eats gluten free    Review of Systems  No chest pain No shortness of breath with exertion She walks and works out regularly but does not lift more than 50 pounds No ankle edema Some intermittent orthostatic dizziness History of pacemaker placement for bradycardia 12 years ago with battery change 3 years ago  BP (!) 104/59 (BP Location: Left Arm, Patient Position: Standing, Cuff Size: Normal)   Pulse 63   Temp (!) 97.3 F (36.3 C)   Resp 16   Ht 5\' 3"  (1.6 m)   Wt 140 lb (63.5 kg)   SpO2 97% Comment: RA  BMI 24.80 kg/m  Physical Exam      Exam    General- alert and comfortable    Neck- no JVD, no cervical adenopathy palpable, no carotid bruit   Lungs- clear without rales, wheezes   Cor- regular rate and rhythm, no murmur , gallop   Abdomen- soft, non-tender   Extremities - warm, non-tender, minimal edema   Neuro- oriented, appropriate, no focal weakness   Diagnostic Tests: CTA images personally reviewed. Ascending aneurysm remains at 4.8 cm. No evidence of mural thickening or ulceration Lung windows clear  Impression: Stable asymptomatic moderate ascending aneurysm.  Unchanged at 4.8 cm since 2018.  Continue screening CT scans annually and exercise limitation as discussed with patient.  Plan: Return in 1 year with CTA of thoracic aorta  Len Childs, MD [facility] [phone]

## 2019-03-05 ENCOUNTER — Telehealth: Payer: Self-pay

## 2019-03-05 NOTE — Telephone Encounter (Signed)
Rx sent to the pharmacy by fax

## 2019-03-05 NOTE — Telephone Encounter (Signed)
[facility] is requesting a refill on E3 1mg / test 1 mg insert I tablet vaginally 3 times weekly.  Paper fax signed by Dr. Wynetta Emery and faxed back.

## 2019-03-12 ENCOUNTER — Ambulatory Visit: Payer: Self-pay | Admitting: Cardiovascular Disease

## 2019-03-13 ENCOUNTER — Ambulatory Visit (INDEPENDENT_AMBULATORY_CARE_PROVIDER_SITE_OTHER): Payer: 59 | Admitting: *Deleted

## 2019-03-13 DIAGNOSIS — I441 Atrioventricular block, second degree: Secondary | ICD-10-CM

## 2019-03-13 DIAGNOSIS — I480 Paroxysmal atrial fibrillation: Secondary | ICD-10-CM | POA: Diagnosis not present

## 2019-03-13 LAB — CUP PACEART REMOTE DEVICE CHECK
Battery Impedance: 188 Ohm
Battery Remaining Longevity: 138 mo
Battery Voltage: 2.78 V
Brady Statistic AP VP Percent: 0 %
Brady Statistic AP VS Percent: 15 %
Brady Statistic AS VP Percent: 0 %
Brady Statistic AS VS Percent: 84 %
Date Time Interrogation Session: 20200901111314
Implantable Lead Implant Date: 20061128
Implantable Lead Implant Date: 20061128
Implantable Lead Location: 753859
Implantable Lead Location: 753860
Implantable Lead Model: 4092
Implantable Lead Model: 4592
Implantable Pulse Generator Implant Date: 20161123
Lead Channel Impedance Value: 646 Ohm
Lead Channel Impedance Value: 758 Ohm
Lead Channel Pacing Threshold Amplitude: 0.375 V
Lead Channel Pacing Threshold Amplitude: 1 V
Lead Channel Pacing Threshold Pulse Width: 0.4 ms
Lead Channel Pacing Threshold Pulse Width: 0.4 ms
Lead Channel Setting Pacing Amplitude: 2 V
Lead Channel Setting Pacing Amplitude: 2.5 V
Lead Channel Setting Pacing Pulse Width: 0.4 ms
Lead Channel Setting Sensing Sensitivity: 4 mV

## 2019-03-28 NOTE — Progress Notes (Signed)
Remote pacemaker transmission.   

## 2019-04-24 DIAGNOSIS — Z961 Presence of intraocular lens: Secondary | ICD-10-CM | POA: Diagnosis not present

## 2019-04-26 ENCOUNTER — Encounter: Payer: Self-pay | Admitting: Family Medicine

## 2019-04-26 ENCOUNTER — Other Ambulatory Visit: Payer: Self-pay

## 2019-04-26 ENCOUNTER — Ambulatory Visit (INDEPENDENT_AMBULATORY_CARE_PROVIDER_SITE_OTHER): Payer: 59 | Admitting: Family Medicine

## 2019-04-26 VITALS — BP 94/70 | HR 63 | Temp 98.5°F | Ht 63.0 in | Wt 140.0 lb

## 2019-04-26 DIAGNOSIS — Z1231 Encounter for screening mammogram for malignant neoplasm of breast: Secondary | ICD-10-CM | POA: Diagnosis not present

## 2019-04-26 DIAGNOSIS — I480 Paroxysmal atrial fibrillation: Secondary | ICD-10-CM | POA: Diagnosis not present

## 2019-04-26 DIAGNOSIS — E039 Hypothyroidism, unspecified: Secondary | ICD-10-CM

## 2019-04-26 DIAGNOSIS — Z0000 Encounter for general adult medical examination without abnormal findings: Secondary | ICD-10-CM

## 2019-04-26 DIAGNOSIS — C109 Malignant neoplasm of oropharynx, unspecified: Secondary | ICD-10-CM

## 2019-04-26 DIAGNOSIS — I7121 Aneurysm of the ascending aorta, without rupture: Secondary | ICD-10-CM

## 2019-04-26 DIAGNOSIS — I712 Thoracic aortic aneurysm, without rupture: Secondary | ICD-10-CM | POA: Diagnosis not present

## 2019-04-26 DIAGNOSIS — Z1211 Encounter for screening for malignant neoplasm of colon: Secondary | ICD-10-CM | POA: Diagnosis not present

## 2019-04-26 LAB — UA/M W/RFLX CULTURE, ROUTINE
Bilirubin, UA: NEGATIVE
Glucose, UA: NEGATIVE
Ketones, UA: NEGATIVE
Leukocytes,UA: NEGATIVE
Nitrite, UA: NEGATIVE
Protein,UA: NEGATIVE
RBC, UA: NEGATIVE
Specific Gravity, UA: 1.02 (ref 1.005–1.030)
Urobilinogen, Ur: 0.2 mg/dL (ref 0.2–1.0)
pH, UA: 7 (ref 5.0–7.5)

## 2019-04-26 MED ORDER — NA SULFATE-K SULFATE-MG SULF 17.5-3.13-1.6 GM/177ML PO SOLN: 1.0000 | Freq: Once | ORAL | 0 refills | Status: AC

## 2019-04-26 NOTE — Progress Notes (Addendum)
BP 94/70   Pulse 63   Temp 98.5 F (36.9 C) (Oral)   Ht 5\' 3"  (1.6 m)   Wt 140 lb (63.5 kg)   SpO2 97%   BMI 24.80 kg/m    Subjective:    Patient ID: Wendy Kelly, female    DOB: 1956-01-27, 63 y.o.   MRN: [id number]  HPI: Wendy Kelly is a 63 y.o. female presenting on 04/26/2019 for comprehensive medical examination. Current medical complaints include:  HYPOTHYROIDISM Thyroid control status:stable Satisfied with current treatment? unsure Medication side effects: no Medication compliance: excellent compliance Recent dose adjustment:no Fatigue: yes Cold intolerance: yes Heat intolerance: no Weight gain: no Weight loss: no Constipation: no Diarrhea/loose stools: no Palpitations: no Lower extremity edema: no Anxiety/depressed mood: no  Menopausal Symptoms: no  Depression Screen done today and results listed below:  Depression screen [facility] 2/9 04/26/2019 04/20/2018 10/10/2017 04/19/2017  Decreased Interest 0 0 0 0  Down, Depressed, Hopeless 0 0 0 0  PHQ - 2 Score 0 0 0 0  Altered sleeping 1 3 - -  Tired, decreased energy 1 1 - -  Change in appetite 0 0 - -  Feeling bad or failure about yourself  0 0 - -  Trouble concentrating 0 0 - -  Moving slowly or fidgety/restless 0 0 - -  Suicidal thoughts 0 0 - -  PHQ-9 Score 2 4 - -  Difficult doing work/chores Not difficult at all Not difficult at all - -  Some recent data might be hidden    Past Medical History:  Past Medical History:  Diagnosis Date  . AA (aortic aneurysm) ([place])    a. 04/2011 Echo: Ao Root: 4.1cm, Asc Ao 4.7cm.  . Anxiety   . AV block, 2nd degree    a. 05/2005 - s/p MDT Adapta ADDR01 Dual Chamber PPM  . Bicuspid aortic valve   . Cancer ([place])    Throat  . Chest pain    a. 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  Marland Kitchen Deep vein thrombosis (DVT) of left lower extremity ([place]) 10/03/2017  . Dehydration 08/11/2017  . Depression   . Expressive aphasia    a. ongoing since 04/2011 - seen by neurology - ? TIA vs. Migraine   . Facial numbness    a. ongoing since 04/2011 - seen by neurology - ? TIA vs. Migraine  . GERD (gastroesophageal reflux disease)   . Low blood pressure   . Personal history of chemotherapy 2018-2019   Throat  . Personal history of radiation therapy 2018-2019   Throat  . Presence of permanent cardiac pacemaker   . 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 Syncope    a. 04/2011 Echo: EF 55-65%, No RWMA, Gr 1 DD.  Marland Kitchen Thyroid disease     Surgical History:  Past Surgical History:  Procedure Laterality Date  . ABDOMINAL WALL MESH  REMOVAL    . bladder tack    . cataract Bilateral 2004  . CHOLECYSTECTOMY  2000  . DILATION AND CURETTAGE OF UTERUS    . DIRECT LARYNGOSCOPY Right 06/16/2017   Procedure: MICRO DIRECT LARYNGOSCOPY WITH BIOPSY OF RIGHT BASE OF TONGUE;  Surgeon: Carloyn Manner, MD;  Location: [facility];  Service: ENT;  Laterality: Right;  . EP IMPLANTABLE DEVICE N/A 06/04/2015   Procedure: PPM Generator Changeout;  Surgeon: Deboraha Sprang, MD;  Location: [facility];  Service: Cardiovascular;  Laterality: N/A;  . ESOPHAGOGASTRODUODENOSCOPY (EGD) WITH PROPOFOL N/A 12/18/2015  Procedure: ESOPHAGOGASTRODUODENOSCOPY (EGD) WITH gastric biopsy and dilation;  Surgeon: Lucilla Lame, MD;  Location: [facility];  Service: Endoscopy;  Laterality: N/A;  . EYE SURGERY    . INSERT / REPLACE / REMOVE PACEMAKER    . IR FLUORO GUIDE PORT INSERTION RIGHT  06/22/2017  . IR REMOVAL TUN ACCESS W/ PORT W/O FL MOD SED  04/14/2018  . MOHS SURGERY  02/2018  . PACEMAKER INSERTION  2006   Medtronic Adapta ADDR01  . TONSILLECTOMY    . TONSILLECTOMY    . VAGINAL HYSTERECTOMY  2013   mad/cope    Medications:  Current Outpatient Medications on File Prior to Visit  Medication Sig  . aspirin 81 MG tablet Take 81 mg by mouth daily.  Marland Kitchen b complex vitamins tablet Take 2 tablets by mouth daily.   Marland Kitchen BLACK ELDERBERRY,BERRY-FLOWER, PO Take 1 capsule by mouth daily.  .  Calcium Carb-Cholecalciferol (CALCIUM 500 + D3 PO) Take 1 Dose by mouth daily.  Marland Kitchen CRANBERRY SOFT PO Take 2 each by mouth daily.   . Digestive Enzymes (DIGESTIVE ENZYME PO) Take 1-2 capsules by mouth daily.  Marland Kitchen levothyroxine (SYNTHROID, LEVOTHROID) 75 MCG tablet Take 1 tablet (75 mcg total) by mouth daily. BRAND NAME SYNTHROID NECESSARY  . Loratadine (CLARITIN PO) Take by mouth daily.  Marland Kitchen PRESCRIPTION MEDICATION Place 1 tablet vaginally 3 (three) times a week. E3 1mg  and Testosterone 1mg  compound   Current Facility-Administered Medications on File Prior to Visit  Medication  . 0.9 %  sodium chloride infusion    Allergies:  Allergies  Allergen Reactions  . Cefdinir Swelling    Swelling in mouth   . Cephalosporins Swelling  . Dairy Aid [Lactase] Swelling    Milk and milk products cause swelling and tingling of tongue  . Flagyl [Metronidazole] Swelling    Mouth and throat and ears go red and burn  . Lactose Intolerance (Gi) Other (See Comments)    GI upset.  Eats only gluten free  . Penicillins Rash    Rash/hives  . Phenylephrine-Guaifenesin Anaphylaxis    Night terrors  . Sulfa Antibiotics Rash and Hives    Other reaction(s): Unknown   . Sulfonamide Derivatives Rash    Rash/itching.  From head to her toes, the itching was horrible. Penicillin is the worst  . Tape Rash    Adhesives all cause a problem severely.  It doesn't matter if it is paper tape or not.  Marland Kitchen Dexilant [Dexlansoprazole]     Pressure in head like head is in a vice and being squeezed  . Entex Lq [Phenylephrine-Guaifenesin]     Night terrors  . Other Other (See Comments)    Flu vaccine:  Weakness, dizziness, tia type symptoms.    . Oxycodone Nausea And Vomiting    And itching  . Succinylcholine Other (See Comments)    Trouble waking up  . Vancomycin Rash  . Tetracycline Other (See Comments)    Unkown  . Codeine Nausea And Vomiting  . Guaifenesin & Derivatives Other (See Comments)    Night terrors  .  Lactobacillus Itching  . Naproxen Sodium     Itching/rash  . Pseudoephedrine Rash  . Tetracyclines & Related Other (See Comments)    Was taking flagyl at the same time; unsure which caused the swelling  Swelling of throat. Pharmacist states that he believes it was the flagyl and not the tetracycline  . Wheat Bran Other (See Comments)    Upset stomach. Now eats gluten free  Social History:  Social History   Socioeconomic History  . Marital status: Married    Spouse name: Not on file  . Number of children: Not on file  . Years of education: Not on file  . Highest education level: Not on file  Occupational History  . Not on file  Social Needs  . Financial resource strain: Not very hard  . Food insecurity    Worry: Never true    Inability: Never true  . Transportation needs    Medical: No    Non-medical: No  Tobacco Use  . Smoking status: Never Smoker  . Smokeless tobacco: Never Used  . Tobacco comment: tobacco use -no  Substance and Sexual Activity  . Alcohol use: No  . Drug use: No  . Sexual activity: Yes    Birth control/protection: Surgical  Lifestyle  . Physical activity    Days per week: 4 days    Minutes per session: 60 min  . Stress: Rather much  Relationships  . Social connections    Talks on phone: More than three times a week    Gets together: Not on file    Attends religious service: More than 4 times per year    Active member of club or organization: Not on file    Attends meetings of clubs or organizations: Not on file    Relationship status: Married  . Intimate partner violence    Fear of current or ex partner: No    Emotionally abused: No    Physically abused: No    Forced sexual activity: No  Other Topics Concern  . Not on file  Social History Narrative   Lives in [place] with husband.  Works out regularly.  Works as a Geophysicist/field seismologist - owns own business.    Social History   Tobacco Use  Smoking Status Never Smoker  Smokeless  Tobacco Never Used  Tobacco Comment   tobacco use -no   Social History   Substance and Sexual Activity  Alcohol Use No    Family History:  Family History  Problem Relation Age of Onset  . COPD Mother        alive @ 6  . Atrial fibrillation Mother   . Stroke Father        died @ 95  . Breast cancer Maternal Aunt 70  . COPD Maternal Grandmother   . Ovarian cancer Neg Hx   . Diabetes Neg Hx     Past medical history, surgical history, medications, allergies, family history and social history reviewed with patient today and changes made to appropriate areas of the chart.   Review of Systems  Constitutional: Negative.   HENT: Negative.   Eyes: Negative.   Respiratory: Negative.   Cardiovascular: Negative.   Gastrointestinal: Positive for heartburn. Negative for abdominal pain, blood in stool, constipation, diarrhea, melena, nausea and vomiting.  Genitourinary: Negative.   Musculoskeletal: Negative.   Skin: Negative.   Neurological: Negative.   Endo/Heme/Allergies: Positive for environmental allergies. Negative for polydipsia. Does not bruise/bleed easily.  Psychiatric/Behavioral: Negative for depression, hallucinations, memory loss, substance abuse and suicidal ideas. The patient has insomnia. The patient is not nervous/anxious.     All other ROS negative except what is listed above and in the HPI.      Objective:    BP 94/70   Pulse 63   Temp 98.5 F (36.9 C) (Oral)   Ht 5\' 3"  (1.6 m)   Wt 140 lb (63.5 kg)  SpO2 97%   BMI 24.80 kg/m   Wt Readings from Last 3 Encounters:  04/26/19 140 lb (63.5 kg)  02/28/19 140 lb (63.5 kg)  02/19/19 140 lb (63.5 kg)    Physical Exam Vitals signs and nursing note reviewed.  Constitutional:      General: She is not in acute distress.    Appearance: Normal appearance. She is not ill-appearing, toxic-appearing or diaphoretic.  HENT:     Head: Normocephalic and atraumatic.     Right Ear: Tympanic membrane, ear canal and  external ear normal. There is no impacted cerumen.     Left Ear: Tympanic membrane, ear canal and external ear normal. There is no impacted cerumen.     Nose: Nose normal. No congestion or rhinorrhea.     Mouth/Throat:     Mouth: Mucous membranes are moist.     Pharynx: Oropharynx is clear. No oropharyngeal exudate or posterior oropharyngeal erythema.  Eyes:     General: No scleral icterus.       Right eye: No discharge.        Left eye: No discharge.     Extraocular Movements: Extraocular movements intact.     Conjunctiva/sclera: Conjunctivae normal.     Pupils: Pupils are equal, round, and reactive to light.  Neck:     Musculoskeletal: Normal range of motion and neck supple. No neck rigidity or muscular tenderness.     Vascular: No carotid bruit.  Cardiovascular:     Rate and Rhythm: Normal rate and regular rhythm.     Pulses: Normal pulses.     Heart sounds: No murmur. No friction rub. No gallop.   Pulmonary:     Effort: Pulmonary effort is normal. No respiratory distress.     Breath sounds: Normal breath sounds. No stridor. No wheezing, rhonchi or rales.  Chest:     Chest wall: No tenderness.  Abdominal:     General: Abdomen is flat. Bowel sounds are normal. There is no distension.     Palpations: Abdomen is soft. There is no mass.     Tenderness: There is no abdominal tenderness. There is no right CVA tenderness, left CVA tenderness, guarding or rebound.     Hernia: No hernia is present.  Genitourinary:    Comments: Breast and pelvic exams deferred with shared decision making Musculoskeletal:        General: No swelling, tenderness, deformity or signs of injury.     Right lower leg: No edema.     Left lower leg: No edema.  Lymphadenopathy:     Cervical: No cervical adenopathy.  Skin:    General: Skin is warm and dry.     Capillary Refill: Capillary refill takes less than 2 seconds.     Coloration: Skin is not jaundiced or pale.     Findings: No bruising, erythema,  lesion or rash.  Neurological:     General: No focal deficit present.     Mental Status: She is alert and oriented to person, place, and time. Mental status is at baseline.     Cranial Nerves: No cranial nerve deficit.     Sensory: No sensory deficit.     Motor: No weakness.     Coordination: Coordination normal.     Gait: Gait normal.     Deep Tendon Reflexes: Reflexes normal.  Psychiatric:        Mood and Affect: Mood normal.        Behavior: Behavior normal.  Thought Content: Thought content normal.        Judgment: Judgment normal.     Results for orders placed or performed in visit on 03/13/19  CUP PACEART REMOTE DEVICE CHECK  Result Value Ref Range   Date Time Interrogation Session [id number]    Pulse Generator Manufacturer MERM    Pulse Gen Model ADDRL1 Adapta    Pulse Gen Serial Number [id number]    Clinic Name [place] Pulse Generator Type Implantable Pulse Generator    Implantable Pulse Generator Implant Date [id number]    Implantable Lead Manufacturer MERM    Implantable Lead Model 4092 CapSure SP Novus    Implantable Lead Serial Number [id number]    Implantable Lead Implant Date [id number]    Implantable Lead Location [id number]    Implantable Lead Manufacturer MERM    Implantable Lead Model 4592 CapSure SP Novus    Implantable Lead Serial Number [id number]    Implantable Lead Implant Date [id number]    Implantable Lead Location [id number]    Lead Channel Setting Sensing Sensitivity 4.00 mV   Lead Channel Setting Pacing Amplitude 2.000 V   Lead Channel Setting Pacing Pulse Width 0.40 ms   Lead Channel Setting Pacing Amplitude 2.500 V   Lead Channel Impedance Value 646 ohm   Lead Channel Pacing Threshold Amplitude 0.375 V   Lead Channel Pacing Threshold Pulse Width 0.40 ms   Lead Channel Impedance Value 758 ohm   Lead Channel Pacing Threshold Amplitude 1.000 V   Lead Channel Pacing Threshold Pulse Width 0.40 ms   Battery Status OK     Battery Remaining Longevity 138 mo   Battery Voltage 2.78 V   Battery Impedance 188 ohm   Brady Statistic AP VP Percent 0 %   Brady Statistic AS VP Percent 0 %   Brady Statistic AP VS Percent 15 %   Brady Statistic AS VS Percent 84 %      Assessment & Plan:   Problem List Items Addressed This Visit      Cardiovascular and Mediastinum   Ascending aortic aneurysm (HCC)    Will keep BP/cholesterol under good control. Checking labs today.      Atrial fibrillation (HCC)    Stable. Continue to follow with cardiology. Call with any concerns.         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 Continuing surveillance with oncology. Call with any concerns.         Endocrine   Hypothyroidism    Rechecking levels today. Await results and will treat as needed. Call with any concerns. Tolerating her medicine well.        Other Visit Diagnoses    Routine general medical examination at a health care facility    -  Primary   Vaccines up to date. Screening labs checked today. Pap N/A. Mammogram and colonoscopy ordered. Continue diet and exercise. Call with any concerns.    Relevant Orders   CBC with Differential/Platelet   Comprehensive metabolic panel   Lipid Panel w/o Chol/HDL Ratio   TSH   UA/M w/rflx Culture, Routine   Colon cancer screening       Due for colonoscopy- referral generated today.   Relevant Orders   Ambulatory referral to Gastroenterology   Encounter for screening mammogram for malignant neoplasm of breast       Due for mammogram- ordered today.   Relevant Orders   MM DIGITAL SCREENING BILATERAL  Follow up plan: Return in about 6 months (around 10/25/2019).   LABORATORY TESTING:  - Pap smear: not applicable  IMMUNIZATIONS:   - Tdap: Tetanus vaccination status reviewed: last tetanus booster within 10 years. - Influenza: N/A - Pneumovax: Not applicable  SCREENING: -Mammogram: Ordered today  -  Colonoscopy: Ordered today   PATIENT COUNSELING:   Advised to take 1 mg of folate supplement per day if capable of pregnancy.   Sexuality: Discussed sexually transmitted diseases, partner selection, use of condoms, avoidance of unintended pregnancy  and contraceptive alternatives.   Advised to avoid cigarette smoking.  I discussed with the patient that most people either abstain from alcohol or drink within safe limits (<=14/week and <=4 drinks/occasion for males, <=7/weeks and <= 3 drinks/occasion for females) and that the risk for alcohol disorders and other health effects rises proportionally with the number of drinks per week and how often a drinker exceeds daily limits.  Discussed cessation/primary prevention of drug use and availability of treatment for abuse.   Diet: Encouraged to adjust caloric intake to maintain  or achieve ideal body weight, to reduce intake of dietary saturated fat and total fat, to limit sodium intake by avoiding high sodium foods and not adding table salt, and to maintain adequate dietary potassium and calcium preferably from fresh fruits, vegetables, and low-fat dairy products.    stressed the importance of regular exercise  Injury prevention: Discussed safety belts, safety helmets, smoke detector, smoking near bedding or upholstery.   Dental health: Discussed importance of regular tooth brushing, flossing, and dental visits.    NEXT PREVENTATIVE PHYSICAL DUE IN 1 YEAR. Return in about 6 months (around 10/25/2019).

## 2019-04-26 NOTE — Assessment & Plan Note (Signed)
Rechecking levels today. Await results and will treat as needed. Call with any concerns. Tolerating her medicine well.

## 2019-04-26 NOTE — Assessment & Plan Note (Signed)
Has finished her treatment. Continuing surveillance with oncology. Call with any concerns.

## 2019-04-26 NOTE — Assessment & Plan Note (Signed)
Will keep BP/cholesterol under good control. Checking labs today.

## 2019-04-26 NOTE — Patient Instructions (Addendum)
Call to schedule your mammogram:  [facility] at [facility]  Address: [address], [place], [place] [address]  Phone: [phone]   Health Maintenance for Postmenopausal Women Menopause is a normal process in which your ability to get pregnant comes to an end. This process happens slowly over many months or years, usually between the ages of 56 and 50. Menopause is complete when you have missed your menstrual periods for 12 months. It is important to talk with your health care provider about some of the most common conditions that affect women after menopause (postmenopausal women). These include heart disease, cancer, and bone loss (osteoporosis). Adopting a healthy lifestyle and getting preventive care can help to promote your health and wellness. The actions you take can also lower your chances of developing some of these common conditions. What should I know about menopause? During menopause, you may get a number of symptoms, such as:  Hot flashes. These can be moderate or severe.  Night sweats.  Decrease in sex drive.  Mood swings.  Headaches.  Tiredness.  Irritability.  Memory problems.  Insomnia. Choosing to treat or not to treat these symptoms is a decision that you make with your health care provider. Do I need hormone replacement therapy?  Hormone replacement therapy is effective in treating symptoms that are caused by menopause, such as hot flashes and night sweats.  Hormone replacement carries certain risks, especially as you become older. If you are thinking about using estrogen or estrogen with progestin, discuss the benefits and risks with your health care provider. What is my risk for heart disease and stroke? The risk of heart disease, heart attack, and stroke increases as you age. One of the causes may be a change in the body's hormones during menopause. This can affect how your body uses dietary fats, triglycerides, and cholesterol.  Heart attack and stroke are medical emergencies. There are many things that you can do to help prevent heart disease and stroke. Watch your blood pressure  High blood pressure causes heart disease and increases the risk of stroke. This is more likely to develop in people who have high blood pressure readings, are of African descent, or are overweight.  Have your blood pressure checked: ? Every 3-5 years if you are 81-59 years of age. ? Every year if you are 62 years old or older. Eat a healthy diet   Eat a diet that includes plenty of vegetables, fruits, low-fat dairy products, and lean protein.  Do not eat a lot of foods that are high in solid fats, added sugars, or sodium. Get regular exercise Get regular exercise. This is one of the most important things you can do for your health. Most adults should:  Try to exercise for at least 150 minutes each week. The exercise should increase your heart rate and make you sweat (moderate-intensity exercise).  Try to do strengthening exercises at least twice each week. Do these in addition to the moderate-intensity exercise.  Spend less time sitting. Even light physical activity can be beneficial. Other tips  Work with your health care provider to achieve or maintain a healthy weight.  Do not use any products that contain nicotine or tobacco, such as cigarettes, e-cigarettes, and chewing tobacco. If you need help quitting, ask your health care provider.  Know your numbers. Ask your health care provider to check your cholesterol and your blood sugar (glucose). Continue to have your blood tested as directed by your health care provider.  Do I need screening for cancer? Depending on your health history and family history, you may need to have cancer screening at different stages of your life. This may include screening for:  Breast cancer.  Cervical cancer.  Lung cancer.  Colorectal cancer. What is my risk for osteoporosis? After menopause,  you may be at increased risk for osteoporosis. Osteoporosis is a condition in which bone destruction happens more quickly than new bone creation. To help prevent osteoporosis or the bone fractures that can happen because of osteoporosis, you may take the following actions:  If you are 22-95 years old, get at least 1,000 mg of calcium and at least 600 mg of vitamin D per day.  If you are older than age 74 but younger than age 62, get at least 1,200 mg of calcium and at least 600 mg of vitamin D per day.  If you are older than age 53, get at least 1,200 mg of calcium and at least 800 mg of vitamin D per day. Smoking and drinking excessive alcohol increase the risk of osteoporosis. Eat foods that are rich in calcium and vitamin D, and do weight-bearing exercises several times each week as directed by your health care provider. How does menopause affect my mental health? Depression may occur at any age, but it is more common as you become older. Common symptoms of depression include:  Low or sad mood.  Changes in sleep patterns.  Changes in appetite or eating patterns.  Feeling an overall lack of motivation or enjoyment of activities that you previously enjoyed.  Frequent crying spells. Talk with your health care provider if you think that you are experiencing depression. General instructions See your health care provider for regular wellness exams and vaccines. This may include:  Scheduling regular health, dental, and eye exams.  Getting and maintaining your vaccines. These include: ? Influenza vaccine. Get this vaccine each year before the flu season begins. ? Pneumonia vaccine. ? Shingles vaccine. ? Tetanus, diphtheria, and pertussis (Tdap) booster vaccine. Your health care provider may also recommend other immunizations. Tell your health care provider if you have ever been abused or do not feel safe at home. Summary  Menopause is a normal process in which your ability to get  pregnant comes to an end.  This condition causes hot flashes, night sweats, decreased interest in sex, mood swings, headaches, or lack of sleep.  Treatment for this condition may include hormone replacement therapy.  Take actions to keep yourself healthy, including exercising regularly, eating a healthy diet, watching your weight, and checking your blood pressure and blood sugar levels.  Get screened for cancer and depression. Make sure that you are up to date with all your vaccines. This information is not intended to replace advice given to you by your health care provider. Make sure you discuss any questions you have with your health care provider. Document Released: 08/20/2005 Document Revised: 06/21/2018 Document Reviewed: 06/21/2018 Elsevier Patient Education  [employer].  Gastroesophageal Reflux Disease, Adult Gastroesophageal reflux (GER) happens when acid from the stomach flows up into the tube that connects the mouth and the stomach (esophagus). Normally, food travels down the esophagus and stays in the stomach to be digested. However, when a person has GER, food and stomach acid sometimes move back up into the esophagus. If this becomes a more serious problem, the person may be diagnosed with a disease called gastroesophageal reflux disease (GERD). GERD occurs when the reflux:  Happens often.  Causes frequent  or severe symptoms.  Causes problems such as damage to the esophagus. When stomach acid comes in contact with the esophagus, the acid may cause soreness (inflammation) in the esophagus. Over time, GERD may create small holes (ulcers) in the lining of the esophagus. What are the causes? This condition is caused by a problem with the muscle between the esophagus and the stomach (lower esophageal sphincter, or LES). Normally, the LES muscle closes after food passes through the esophagus to the stomach. When the LES is weakened or abnormal, it does not close properly, and  that allows food and stomach acid to go back up into the esophagus. The LES can be weakened by certain dietary substances, medicines, and medical conditions, including:  Tobacco use.  Pregnancy.  Having a hiatal hernia.  Alcohol use.  Certain foods and beverages, such as coffee, chocolate, onions, and peppermint. What increases the risk? You are more likely to develop this condition if you:  Have an increased body weight.  Have a connective tissue disorder.  Use NSAID medicines. What are the signs or symptoms? Symptoms of this condition include:  Heartburn.  Difficult or painful swallowing.  The feeling of having a lump in the throat.  Abitter taste in the mouth.  Bad breath.  Having a large amount of saliva.  Having an upset or bloated stomach.  Belching.  Chest pain. Different conditions can cause chest pain. Make sure you see your health care provider if you experience chest pain.  Shortness of breath or wheezing.  Ongoing (chronic) cough or a night-time cough.  Wearing away of tooth enamel.  Weight loss. How is this diagnosed? Your health care provider will take a medical history and perform a physical exam. To determine if you have mild or severe GERD, your health care provider may also monitor how you respond to treatment. You may also have tests, including:  A test to examine your stomach and esophagus with a small camera (endoscopy).  A test thatmeasures the acidity level in your esophagus.  A test thatmeasures how much pressure is on your esophagus.  A barium swallow or modified barium swallow test to show the shape, size, and functioning of your esophagus. How is this treated? The goal of treatment is to help relieve your symptoms and to prevent complications. Treatment for this condition may vary depending on how severe your symptoms are. Your health care provider may recommend:  Changes to your diet.  Medicine.  Surgery. Follow these  instructions at home: Eating and drinking   Follow a diet as recommended by your health care provider. This may involve avoiding foods and drinks such as: ? Coffee and tea (with or without caffeine). ? Drinks that containalcohol. ? Energy drinks and sports drinks. ? Carbonated drinks or sodas. ? Chocolate and cocoa. ? Peppermint and mint flavorings. ? Garlic and onions. ? Horseradish. ? Spicy and acidic foods, including peppers, chili powder, curry powder, vinegar, hot sauces, and barbecue sauce. ? Citrus fruit juices and citrus fruits, such as oranges, lemons, and limes. ? Tomato-based foods, such as red sauce, chili, salsa, and pizza with red sauce. ? Fried and fatty foods, such as donuts, french fries, potato chips, and high-fat dressings. ? High-fat meats, such as hot dogs and fatty cuts of red and white meats, such as rib eye steak, sausage, ham, and bacon. ? High-fat dairy items, such as whole milk, butter, and cream cheese.  Eat small, frequent meals instead of large meals.  Avoid drinking large amounts of  liquid with your meals.  Avoid eating meals during the 2-3 hours before bedtime.  Avoid lying down right after you eat.  Do not exercise right after you eat. Lifestyle   Do not use any products that contain nicotine or tobacco, such as cigarettes, e-cigarettes, and chewing tobacco. If you need help quitting, ask your health care provider.  Try to reduce your stress by using methods such as yoga or meditation. If you need help reducing stress, ask your health care provider.  If you are overweight, reduce your weight to an amount that is healthy for you. Ask your health care provider for guidance about a safe weight loss goal. General instructions  Pay attention to any changes in your symptoms.  Take over-the-counter and prescription medicines only as told by your health care provider. Do not take aspirin, ibuprofen, or other NSAIDs unless your health care provider  told you to do so.  Wear loose-fitting clothing. Do not wear anything tight around your waist that causes pressure on your abdomen.  Raise (elevate) the head of your bed about 6 inches (15 cm).  Avoid bending over if this makes your symptoms worse.  Keep all follow-up visits as told by your health care provider. This is important. Contact a health care provider if:  You have: ? New symptoms. ? Unexplained weight loss. ? Difficulty swallowing or it hurts to swallow. ? Wheezing or a persistent cough. ? A hoarse voice.  Your symptoms do not improve with treatment. Get help right away if you:  Have pain in your arms, neck, jaw, teeth, or back.  Feel sweaty, dizzy, or light-headed.  Have chest pain or shortness of breath.  Vomit and your vomit looks like blood or coffee grounds.  Faint.  Have stool that is bloody or black.  Cannot swallow, drink, or eat. Summary  Gastroesophageal reflux happens when acid from the stomach flows up into the esophagus. GERD is a disease in which the reflux happens often, causes frequent or severe symptoms, or causes problems such as damage to the esophagus.  Treatment for this condition may vary depending on how severe your symptoms are. Your health care provider may recommend diet and lifestyle changes, medicine, or surgery.  Contact a health care provider if you have new or worsening symptoms.  Take over-the-counter and prescription medicines only as told by your health care provider. Do not take aspirin, ibuprofen, or other NSAIDs unless your health care provider told you to do so.  Keep all follow-up visits as told by your health care provider. This is important. This information is not intended to replace advice given to you by your health care provider. Make sure you discuss any questions you have with your health care provider. Document Released: 04/07/2005 Document Revised: 01/04/2018 Document Reviewed: 01/04/2018 Elsevier Patient  Education  2020 [employer] for Gastroesophageal Reflux Disease, Adult When you have gastroesophageal reflux disease (GERD), the foods you eat and your eating habits are very important. Choosing the right foods can help ease the discomfort of GERD. Consider working with a diet and nutrition specialist (dietitian) to help you make healthy food choices. What general guidelines should I follow?  Eating plan  Choose healthy foods low in fat, such as fruits, vegetables, whole grains, low-fat dairy products, and lean meat, fish, and poultry.  Eat frequent, small meals instead of three large meals each day. Eat your meals slowly, in a relaxed setting. Avoid bending over or lying down until 2-3 hours after  eating.  Limit high-fat foods such as fatty meats or fried foods.  Limit your intake of oils, butter, and shortening to less than 8 teaspoons each day.  Avoid the following: ? Foods that cause symptoms. These may be different for different people. Keep a food diary to keep track of foods that cause symptoms. ? Alcohol. ? Drinking large amounts of liquid with meals. ? Eating meals during the 2-3 hours before bed.  Cook foods using methods other than frying. This may include baking, grilling, or broiling. Lifestyle  Maintain a healthy weight. Ask your health care provider what weight is healthy for you. If you need to lose weight, work with your health care provider to do so safely.  Exercise for at least 30 minutes on 5 or more days each week, or as told by your health care provider.  Avoid wearing clothes that fit tightly around your waist and chest.  Do not use any products that contain nicotine or tobacco, such as cigarettes and e-cigarettes. If you need help quitting, ask your health care provider.  Sleep with the head of your bed raised. Use a wedge under the mattress or blocks under the bed frame to raise the head of the bed. What foods are not recommended? The  items listed may not be a complete list. Talk with your dietitian about what dietary choices are best for you. Grains Pastries or quick breads with added fat. [place] toast. Vegetables Deep fried vegetables. [place] fries. Any vegetables prepared with added fat. Any vegetables that cause symptoms. For some people this may include tomatoes and tomato products, chili peppers, onions and garlic, and horseradish. Fruits Any fruits prepared with added fat. Any fruits that cause symptoms. For some people this may include citrus fruits, such as oranges, grapefruit, pineapple, and lemons. Meats and other protein foods High-fat meats, such as fatty beef or pork, hot dogs, ribs, ham, sausage, salami and bacon. Fried meat or protein, including fried fish and fried chicken. Nuts and nut butters. Dairy Whole milk and chocolate milk. Sour cream. Cream. Ice cream. Cream cheese. Milk shakes. Beverages Coffee and tea, with or without caffeine. Carbonated beverages. Sodas. Energy drinks. Fruit juice made with acidic fruits (such as orange or grapefruit). Tomato juice. Alcoholic drinks. Fats and oils Butter. Margarine. Shortening. Ghee. Sweets and desserts Chocolate and cocoa. Donuts. Seasoning and other foods Pepper. Peppermint and spearmint. Any condiments, herbs, or seasonings that cause symptoms. For some people, this may include curry, hot sauce, or vinegar-based salad dressings. Summary  When you have gastroesophageal reflux disease (GERD), food and lifestyle choices are very important to help ease the discomfort of GERD.  Eat frequent, small meals instead of three large meals each day. Eat your meals slowly, in a relaxed setting. Avoid bending over or lying down until 2-3 hours after eating.  Limit high-fat foods such as fatty meat or fried foods. This information is not intended to replace advice given to you by your health care provider. Make sure you discuss any questions you have with your health  care provider. Document Released: 06/28/2005 Document Revised: 10/19/2018 Document Reviewed: 06/29/2016 Elsevier Patient Education  2020 [employer].

## 2019-04-26 NOTE — Assessment & Plan Note (Signed)
Stable. Continue to follow with cardiology. Call with any concerns.  

## 2019-04-27 ENCOUNTER — Other Ambulatory Visit: Payer: Self-pay | Admitting: Family Medicine

## 2019-04-27 LAB — COMPREHENSIVE METABOLIC PANEL
ALT: 18 IU/L (ref 0–32)
AST: 20 IU/L (ref 0–40)
Albumin/Globulin Ratio: 1.7 (ref 1.2–2.2)
Albumin: 4 g/dL (ref 3.8–4.8)
Alkaline Phosphatase: 77 IU/L (ref 39–117)
BUN/Creatinine Ratio: 19 (ref 12–28)
BUN: 15 mg/dL (ref 8–27)
Bilirubin Total: 1.2 mg/dL (ref 0.0–1.2)
CO2: 26 mmol/L (ref 20–29)
Calcium: 9.6 mg/dL (ref 8.7–10.3)
Chloride: 104 mmol/L (ref 96–106)
Creatinine, Ser: 0.77 mg/dL (ref 0.57–1.00)
GFR calc Af Amer: 94 mL/min/{1.73_m2} (ref >59)
GFR calc non Af Amer: 82 mL/min/{1.73_m2} (ref >59)
Globulin, Total: 2.4 g/dL (ref 1.5–4.5)
Glucose: 84 mg/dL (ref 65–99)
Potassium: 4 mmol/L (ref 3.5–5.2)
Sodium: 143 mmol/L (ref 134–144)
Total Protein: 6.4 g/dL (ref 6.0–8.5)

## 2019-04-27 LAB — LIPID PANEL W/O CHOL/HDL RATIO
Cholesterol, Total: 155 mg/dL (ref 100–199)
HDL: 67 mg/dL (ref >39)
LDL Chol Calc (NIH): 80 mg/dL (ref 0–99)
Triglycerides: 35 mg/dL (ref 0–149)
VLDL Cholesterol Cal: 8 mg/dL (ref 5–40)

## 2019-04-27 LAB — CBC WITH DIFFERENTIAL/PLATELET
Basophils Absolute: 0 10*3/uL (ref 0.0–0.2)
Basos: 1 %
EOS (ABSOLUTE): 0.1 10*3/uL (ref 0.0–0.4)
Eos: 2 %
Hematocrit: 41.3 % (ref 34.0–46.6)
Hemoglobin: 13.9 g/dL (ref 11.1–15.9)
Immature Grans (Abs): 0 10*3/uL (ref 0.0–0.1)
Immature Granulocytes: 0 %
Lymphocytes Absolute: 0.7 10*3/uL (ref 0.7–3.1)
Lymphs: 19 %
MCH: 31.2 pg (ref 26.6–33.0)
MCHC: 33.7 g/dL (ref 31.5–35.7)
MCV: 93 fL (ref 79–97)
Monocytes Absolute: 0.4 10*3/uL (ref 0.1–0.9)
Monocytes: 9 %
Neutrophils Absolute: 2.6 10*3/uL (ref 1.4–7.0)
Neutrophils: 69 %
Platelets: 138 10*3/uL — ABNORMAL LOW (ref 150–450)
RBC: 4.46 x10E6/uL (ref 3.77–5.28)
RDW: 12.1 % (ref 11.7–15.4)
WBC: 3.8 10*3/uL (ref 3.4–10.8)

## 2019-04-27 LAB — TSH: TSH: 3.45 u[IU]/mL (ref 0.450–4.500)

## 2019-04-27 MED ORDER — LEVOTHYROXINE SODIUM 75 MCG PO TABS: 75.0000 ug | ORAL_TABLET | Freq: Every day | ORAL | 3 refills | Status: DC

## 2019-04-29 NOTE — Progress Notes (Signed)
Acceptable risk for colonoscopy Would stay on asa 81 daily

## 2019-05-04 ENCOUNTER — Telehealth: Payer: Self-pay | Admitting: Family Medicine

## 2019-05-04 ENCOUNTER — Encounter: Payer: Self-pay | Admitting: Family Medicine

## 2019-05-04 NOTE — Telephone Encounter (Deleted)
Above eyebrows and side of face is painful due to pressure from sinus infection. Pt would like something called in for her today if possible so it wont get worse over the weekend/ please advise

## 2019-05-07 ENCOUNTER — Ambulatory Visit: Payer: 59 | Admitting: Family Medicine

## 2019-05-07 NOTE — Telephone Encounter (Signed)
Patient called and scheduled an appt

## 2019-05-07 NOTE — Telephone Encounter (Signed)
LVM for pt to call back.

## 2019-05-08 ENCOUNTER — Ambulatory Visit (INDEPENDENT_AMBULATORY_CARE_PROVIDER_SITE_OTHER): Payer: 59 | Admitting: Family Medicine

## 2019-05-08 ENCOUNTER — Encounter: Payer: Self-pay | Admitting: Family Medicine

## 2019-05-08 ENCOUNTER — Other Ambulatory Visit: Payer: Self-pay

## 2019-05-08 ENCOUNTER — Telehealth: Payer: Self-pay

## 2019-05-08 VITALS — BP 103/69 | HR 71 | Temp 98.6°F

## 2019-05-08 DIAGNOSIS — B029 Zoster without complications: Secondary | ICD-10-CM

## 2019-05-08 MED ORDER — NORTRIPTYLINE HCL 25 MG PO CAPS: 25.0000 mg | ORAL_CAPSULE | Freq: Every day | ORAL | 1 refills | Status: DC

## 2019-05-08 MED ORDER — VALACYCLOVIR HCL 1 G PO TABS: 1000.0000 mg | ORAL_TABLET | Freq: Two times a day (BID) | ORAL | 0 refills | Status: DC

## 2019-05-08 NOTE — Telephone Encounter (Signed)
Patients colonoscopy has been rescheduled to December 15th at [facility] with Dr. Allen Norris.  Trish in Endo has been informed of this date change due to patient having the Shingles.  Pt has been asked to have her COVID test on Friday 04/22/19.  New instructions will be sent to her via mychart.  Thanks [employer]

## 2019-05-08 NOTE — Patient Instructions (Signed)
Shingles  Shingles, which is also known as herpes zoster, is an infection that causes a painful skin rash and fluid-filled blisters. It is caused by a virus. Shingles only develops in people who:  Have had chickenpox.  Have been given a medicine to protect against chickenpox (have been vaccinated). Shingles is rare in this group. What are the causes? Shingles is caused by varicella-zoster virus (VZV). This is the same virus that causes chickenpox. After a person is exposed to VZV, the virus stays in the body in an inactive (dormant) state. Shingles develops if the virus is reactivated. This can happen many years after the first (initial) exposure to VZV. It is not known what causes this virus to be reactivated. What increases the risk? People who have had chickenpox or received the chickenpox vaccine are at risk for shingles. Shingles infection is more common in people who:  Are older than age 60.  Have a weakened disease-fighting system (immune system), such as people with: ? HIV. ? AIDS. ? Cancer.  Are taking medicines that weaken the immune system, such as transplant medicines.  Are experiencing a lot of stress. What are the signs or symptoms? Early symptoms of this condition include itching, tingling, and pain in an area on your skin. Pain may be described as burning, stabbing, or throbbing. A few days or weeks after early symptoms start, a painful red rash appears. The rash is usually on one side of the body and has a band-like or belt-like pattern. The rash eventually turns into fluid-filled blisters that break open, change into scabs, and dry up in about 2-3 weeks. At any time during the infection, you may also develop:  A fever.  Chills.  A headache.  An upset stomach. How is this diagnosed? This condition is diagnosed with a skin exam. Skin or fluid samples may be taken from the blisters before a diagnosis is made. These samples are examined under a microscope or sent to  a lab for testing. How is this treated? The rash may last for several weeks. There is not a specific cure for this condition. Your health care provider will probably prescribe medicines to help you manage pain, recover more quickly, and avoid long-term problems. Medicines may include:  Antiviral drugs.  Anti-inflammatory drugs.  Pain medicines.  Anti-itching medicines (antihistamines). If the area involved is on your face, you may be referred to a specialist, such as an eye doctor (ophthalmologist) or an ear, nose, and throat (ENT) doctor (otolaryngologist) to help you avoid eye problems, chronic pain, or disability. Follow these instructions at home: Medicines  Take over-the-counter and prescription medicines only as told by your health care provider.  Apply an anti-itch cream or numbing cream to the affected area as told by your health care provider. Relieving itching and discomfort   Apply cold, wet cloths (cold compresses) to the area of the rash or blisters as told by your health care provider.  Cool baths can be soothing. Try adding baking soda or dry oatmeal to the water to reduce itching. Do not bathe in hot water. Blister and rash care  Keep your rash covered with a loose bandage (dressing). Wear loose-fitting clothing to help ease the pain of material rubbing against the rash.  Keep your rash and blisters clean by washing the area with mild soap and cool water as told by your health care provider.  Check your rash every day for signs of infection. Check for: ? More redness, swelling, or pain. ? Fluid   or blood. ? Warmth. ? Pus or a bad smell.  Do not scratch your rash or pick at your blisters. To help avoid scratching: ? Keep your fingernails clean and cut short. ? Wear gloves or mittens while you sleep, if scratching is a problem. General instructions  Rest as told by your health care provider.  Keep all follow-up visits as told by your health care provider. This  is important.  Wash your hands often with soap and water. If soap and water are not available, use hand sanitizer. Doing this lowers your chance of getting a bacterial skin infection.  Before your blisters change into scabs, your shingles infection can cause chickenpox in people who have never had it or have never been vaccinated against it. To prevent this from happening, avoid contact with other people, especially: ? Babies. ? Pregnant women. ? Children who have eczema. ? Elderly people who have transplants. ? People who have chronic illnesses, such as cancer or AIDS. Contact a health care provider if:  Your pain is not relieved with prescribed medicines.  Your pain does not get better after the rash heals.  You have signs of infection in the rash area, such as: ? More redness, swelling, or pain around the rash. ? Fluid or blood coming from the rash. ? The rash area feeling warm to the touch. ? Pus or a bad smell coming from the rash. Get help right away if:  The rash is on your face or nose.  You have facial pain, pain around your eye area, or loss of feeling on one side of your face.  You have difficulty seeing.  You have ear pain or have ringing in your ear.  You have a loss of taste.  Your condition gets worse. Summary  Shingles, which is also known as herpes zoster, is an infection that causes a painful skin rash and fluid-filled blisters.  This condition is diagnosed with a skin exam. Skin or fluid samples may be taken from the blisters and examined before the diagnosis is made.  Keep your rash covered with a loose bandage (dressing). Wear loose-fitting clothing to help ease the pain of material rubbing against the rash.  Before your blisters change into scabs, your shingles infection can cause chickenpox in people who have never had it or have never been vaccinated against it. This information is not intended to replace advice given to you by your health care  provider. Make sure you discuss any questions you have with your health care provider. Document Released: 06/28/2005 Document Revised: 10/20/2018 Document Reviewed: 03/02/2017 Elsevier Patient Education  2020 [employer]  

## 2019-05-08 NOTE — Telephone Encounter (Signed)
-----   Message from Emily Filbert, RN sent at 05/08/2019  9:18 AM EDT ----- Darlyn Chamber,  I'm one of the nurses with Dr. Rockey Situ (cardiology). I had tried to route his note  back to last week stating the patient was ok for her colonoscopy.   Just wanted to make sure you were able to see his documentation.  Thank you! Nira Conn, RN

## 2019-05-08 NOTE — Telephone Encounter (Signed)
Cardiac Clearance has been granted by Dr. Rockey Situ , and he advises her to stay on 81mg  of Aspirin.  Contacted patient to make her aware of this just now.  She said she will need to reschedule procedure due to new diagnosis of Shingles.  Will await for her to call me back to reschedule her colonoscopy with Dr. Allen Norris at [facility].  Thanks [employer]

## 2019-05-08 NOTE — Progress Notes (Signed)
BP 103/69   Pulse 71   Temp 98.6 F (37 C)   SpO2 100%    Subjective:    Patient ID: Wendy Kelly, female    DOB: 11-17-1955, 63 y.o.   MRN: [id number]  HPI: Wendy Kelly is a 63 y.o. female  Chief Complaint  Patient presents with  . Pain    right side of of body from head to waist line, very sensitative to touch, knots in head , was told that she has bumps on head with a clear looking top by daughter  . Other    round spot showed up on her leg saturday morning, no issues with it   [name] presents today with pain in the R side of her body- mainly in her head and the side of her face. She was concerned that she might have a sinus infection and saw her ENT- he gave her an antibiotic and that helped her facial pain, but the pain in her face has not gotten any better. Still taking her antibiotics. She notes that she noticed bumps on the back of her head and they have been very painful and very sensitive to touch. No fevers. No chills. No other concerns or complaints at this time.   Relevant past medical, surgical, family and social history reviewed and updated as indicated. Interim medical history since our last visit reviewed. Allergies and medications reviewed and updated.  Review of Systems  Constitutional: Negative.   Respiratory: Negative.   Cardiovascular: Negative.   Musculoskeletal: Negative.   Skin: Positive for rash. Negative for color change, pallor and wound.  Psychiatric/Behavioral: Negative.     Per HPI unless specifically indicated above     Objective:    BP 103/69   Pulse 71   Temp 98.6 F (37 C)   SpO2 100%   Wt Readings from Last 3 Encounters:  04/26/19 140 lb (63.5 kg)  02/28/19 140 lb (63.5 kg)  02/19/19 140 lb (63.5 kg)    Physical Exam Vitals signs and nursing note reviewed.  Constitutional:      General: She is not in acute distress.    Appearance: Normal appearance. She is not ill-appearing, toxic-appearing or diaphoretic.  HENT:     Head:  Normocephalic and atraumatic.     Right Ear: External ear normal.     Left Ear: External ear normal.     Nose: Nose normal.     Mouth/Throat:     Mouth: Mucous membranes are moist.     Pharynx: Oropharynx is clear.  Eyes:     General: No scleral icterus.       Right eye: No discharge.        Left eye: No discharge.     Extraocular Movements: Extraocular movements intact.     Conjunctiva/sclera: Conjunctivae normal.     Pupils: Pupils are equal, round, and reactive to light.  Neck:     Musculoskeletal: Normal range of motion and neck supple.  Cardiovascular:     Rate and Rhythm: Normal rate and regular rhythm.     Pulses: Normal pulses.     Heart sounds: Normal heart sounds. No murmur. No friction rub. No gallop.   Pulmonary:     Effort: Pulmonary effort is normal. No respiratory distress.     Breath sounds: Normal breath sounds. No stridor. No wheezing, rhonchi or rales.  Chest:     Chest wall: No tenderness.  Musculoskeletal: Normal range of motion.  Skin:    General: Skin  is warm and dry.     Capillary Refill: Capillary refill takes less than 2 seconds.     Coloration: Skin is not jaundiced or pale.     Findings: No bruising, erythema, lesion or rash.       Neurological:     General: No focal deficit present.     Mental Status: She is alert and oriented to person, place, and time. Mental status is at baseline.  Psychiatric:        Mood and Affect: Mood normal.        Behavior: Behavior normal.        Thought Content: Thought content normal.        Judgment: Judgment normal.     Results for orders placed or performed in visit on 04/26/19  CBC with Differential/Platelet  Result Value Ref Range   WBC 3.8 3.4 - 10.8 x10E3/uL   RBC 4.46 3.77 - 5.28 x10E6/uL   Hemoglobin 13.9 11.1 - 15.9 g/dL   Hematocrit 41.3 34.0 - 46.6 %   MCV 93 79 - 97 fL   MCH 31.2 26.6 - 33.0 pg   MCHC 33.7 31.5 - 35.7 g/dL   RDW 12.1 11.7 - 15.4 %   Platelets 138 (L) 150 - 450 x10E3/uL    Neutrophils 69 Not Estab. %   Lymphs 19 Not Estab. %   Monocytes 9 Not Estab. %   Eos 2 Not Estab. %   Basos 1 Not Estab. %   Neutrophils Absolute 2.6 1.4 - 7.0 x10E3/uL   Lymphocytes Absolute 0.7 0.7 - 3.1 x10E3/uL   Monocytes Absolute 0.4 0.1 - 0.9 x10E3/uL   EOS (ABSOLUTE) 0.1 0.0 - 0.4 x10E3/uL   Basophils Absolute 0.0 0.0 - 0.2 x10E3/uL   Immature Granulocytes 0 Not Estab. %   Immature Grans (Abs) 0.0 0.0 - 0.1 x10E3/uL  Comprehensive metabolic panel  Result Value Ref Range   Glucose 84 65 - 99 mg/dL   BUN 15 8 - 27 mg/dL   Creatinine, Ser 0.77 0.57 - 1.00 mg/dL   GFR calc non Af Amer 82 >59 mL/min/1.73   GFR calc Af Amer 94 >59 mL/min/1.73   BUN/Creatinine Ratio 19 12 - 28   Sodium 143 134 - 144 mmol/L   Potassium 4.0 3.5 - 5.2 mmol/L   Chloride 104 96 - 106 mmol/L   CO2 26 20 - 29 mmol/L   Calcium 9.6 8.7 - 10.3 mg/dL   Total Protein 6.4 6.0 - 8.5 g/dL   Albumin 4.0 3.8 - 4.8 g/dL   Globulin, Total 2.4 1.5 - 4.5 g/dL   Albumin/Globulin Ratio 1.7 1.2 - 2.2   Bilirubin Total 1.2 0.0 - 1.2 mg/dL   Alkaline Phosphatase 77 39 - 117 IU/L   AST 20 0 - 40 IU/L   ALT 18 0 - 32 IU/L  Lipid Panel w/o Chol/HDL Ratio  Result Value Ref Range   Cholesterol, Total 155 100 - 199 mg/dL   Triglycerides 35 0 - 149 mg/dL   HDL 67 >39 mg/dL   VLDL Cholesterol Cal 8 5 - 40 mg/dL   LDL Chol Calc (NIH) 80 0 - 99 mg/dL  TSH  Result Value Ref Range   TSH 3.450 0.450 - 4.500 uIU/mL  UA/M w/rflx Culture, Routine   Specimen: Urine   URINE  Result Value Ref Range   Specific Gravity, UA 1.020 1.005 - 1.030   pH, UA 7.0 5.0 - 7.5   Color, UA Yellow Yellow   Appearance Ur  Clear Clear   Leukocytes,UA Negative Negative   Protein,UA Negative Negative/Trace   Glucose, UA Negative Negative   Ketones, UA Negative Negative   RBC, UA Negative Negative   Bilirubin, UA Negative Negative   Urobilinogen, Ur 0.2 0.2 - 1.0 mg/dL   Nitrite, UA Negative Negative      Assessment & Plan:   Problem  List Items Addressed This Visit    None    Visit Diagnoses    Herpes zoster without complication    -  Primary   Will treat with valtrex and nortriptyline. Advised to stay out of work until dried up. Call with any concerns. Continue to monitor.   Relevant Medications   clarithromycin (BIAXIN) 500 MG tablet   valACYclovir (VALTREX) 1000 MG tablet       Follow up plan: Return if symptoms worsen or fail to improve.

## 2019-05-10 ENCOUNTER — Telehealth: Payer: Self-pay

## 2019-05-10 NOTE — Telephone Encounter (Signed)
Called and spoke with patient she wanted to know how long it would take for her to start feeling better with the shingles, I let her know it can take some time for the meds to start helping.   Copied from [place] [phone]. Topic: General - Other >> May 10, 2019  9:57 AM Wendy Kelly wrote: Reason for CRM: pt is calling in to speak with CMA> pt says that she was Dx with shingles, pt has a few questions. Pt says that she is in pain.

## 2019-05-11 ENCOUNTER — Inpatient Hospital Stay: Payer: 59

## 2019-05-11 ENCOUNTER — Inpatient Hospital Stay: Payer: 59 | Admitting: Oncology

## 2019-05-13 IMAGING — CR DG CHEST 2V
1 series · 2 of 2 positions shown · non-contrast
Comparison: None.

CLINICAL DATA: Productive cough and fever. Oropharyngeal squamous
cell carcinoma. Currently undergoing chemotherapy and radiation
therapy.

EXAM:
CHEST  2 VIEW

[Series 1: dg chest 2 view · 0.14mm/px · 2 of 2 slices shown]
[im 1/2]
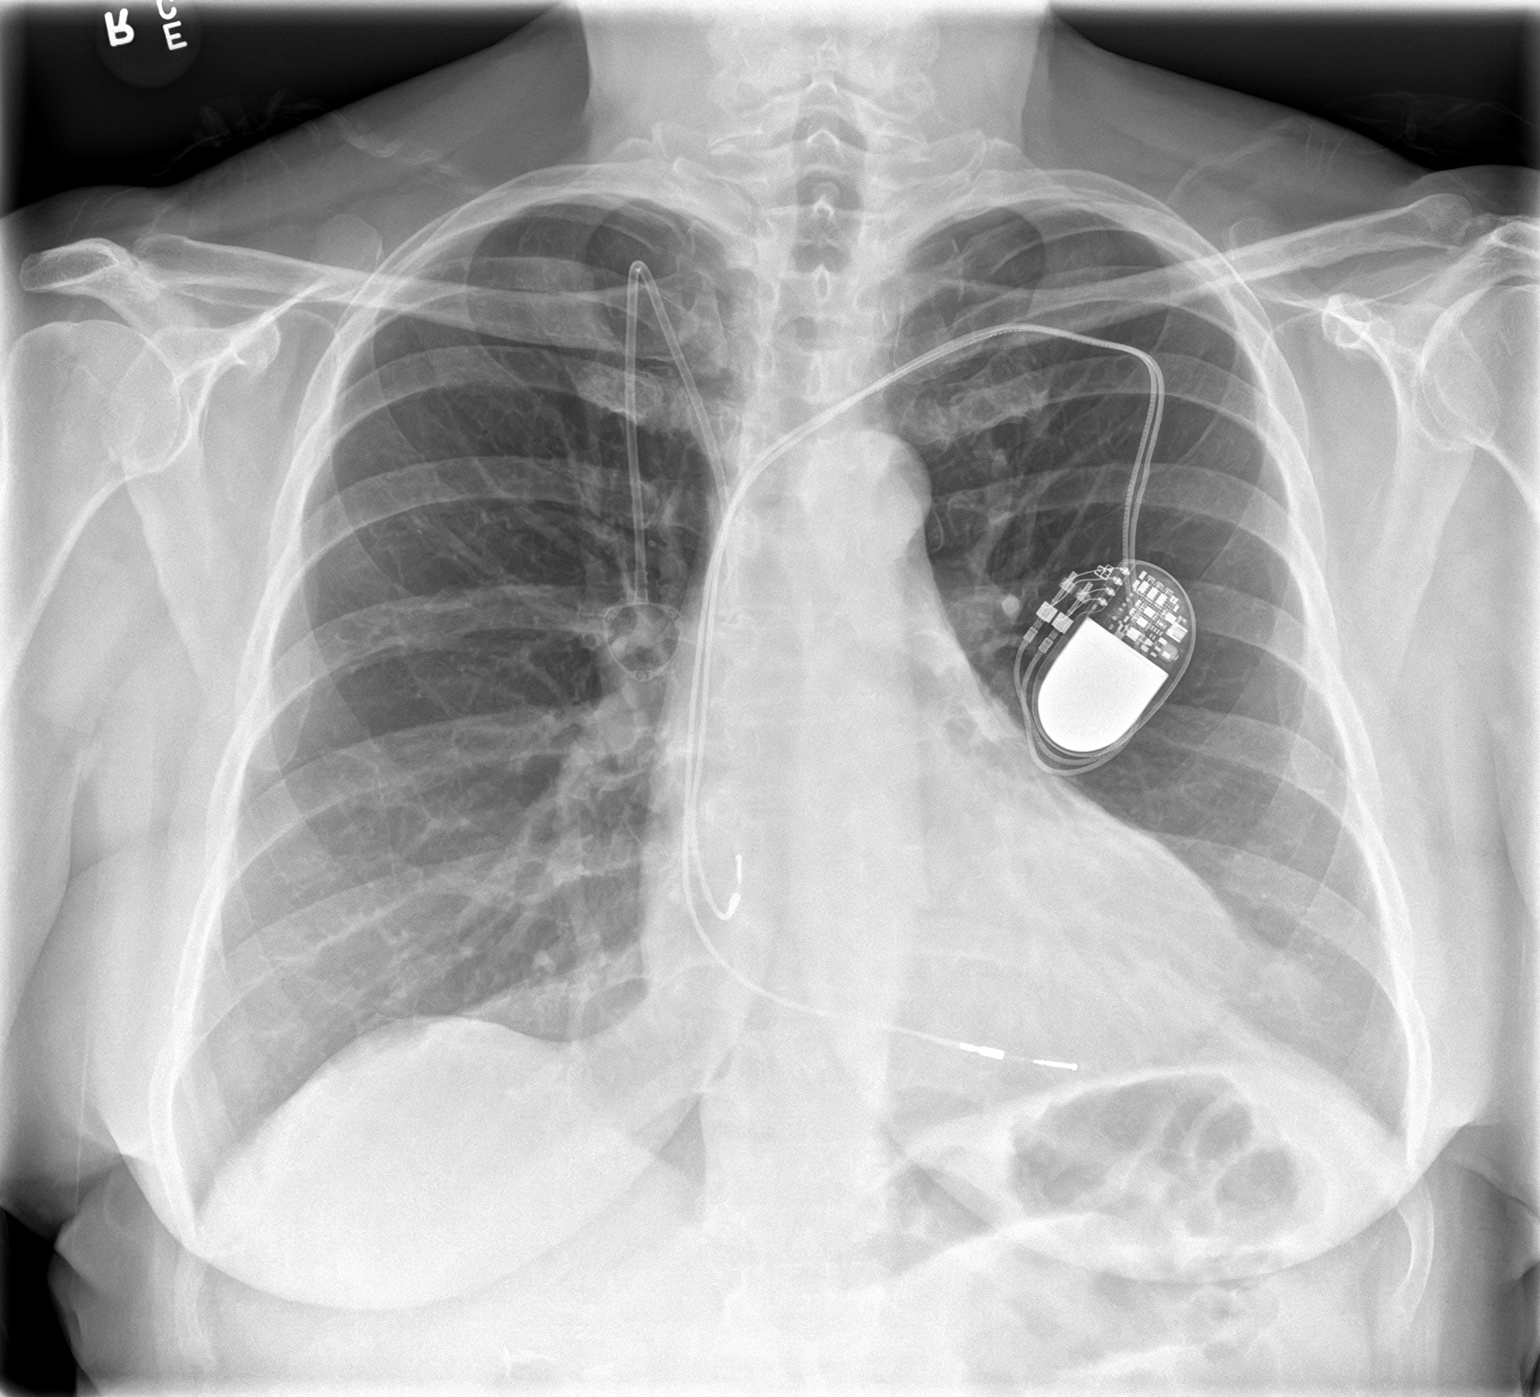
[im 2/2]
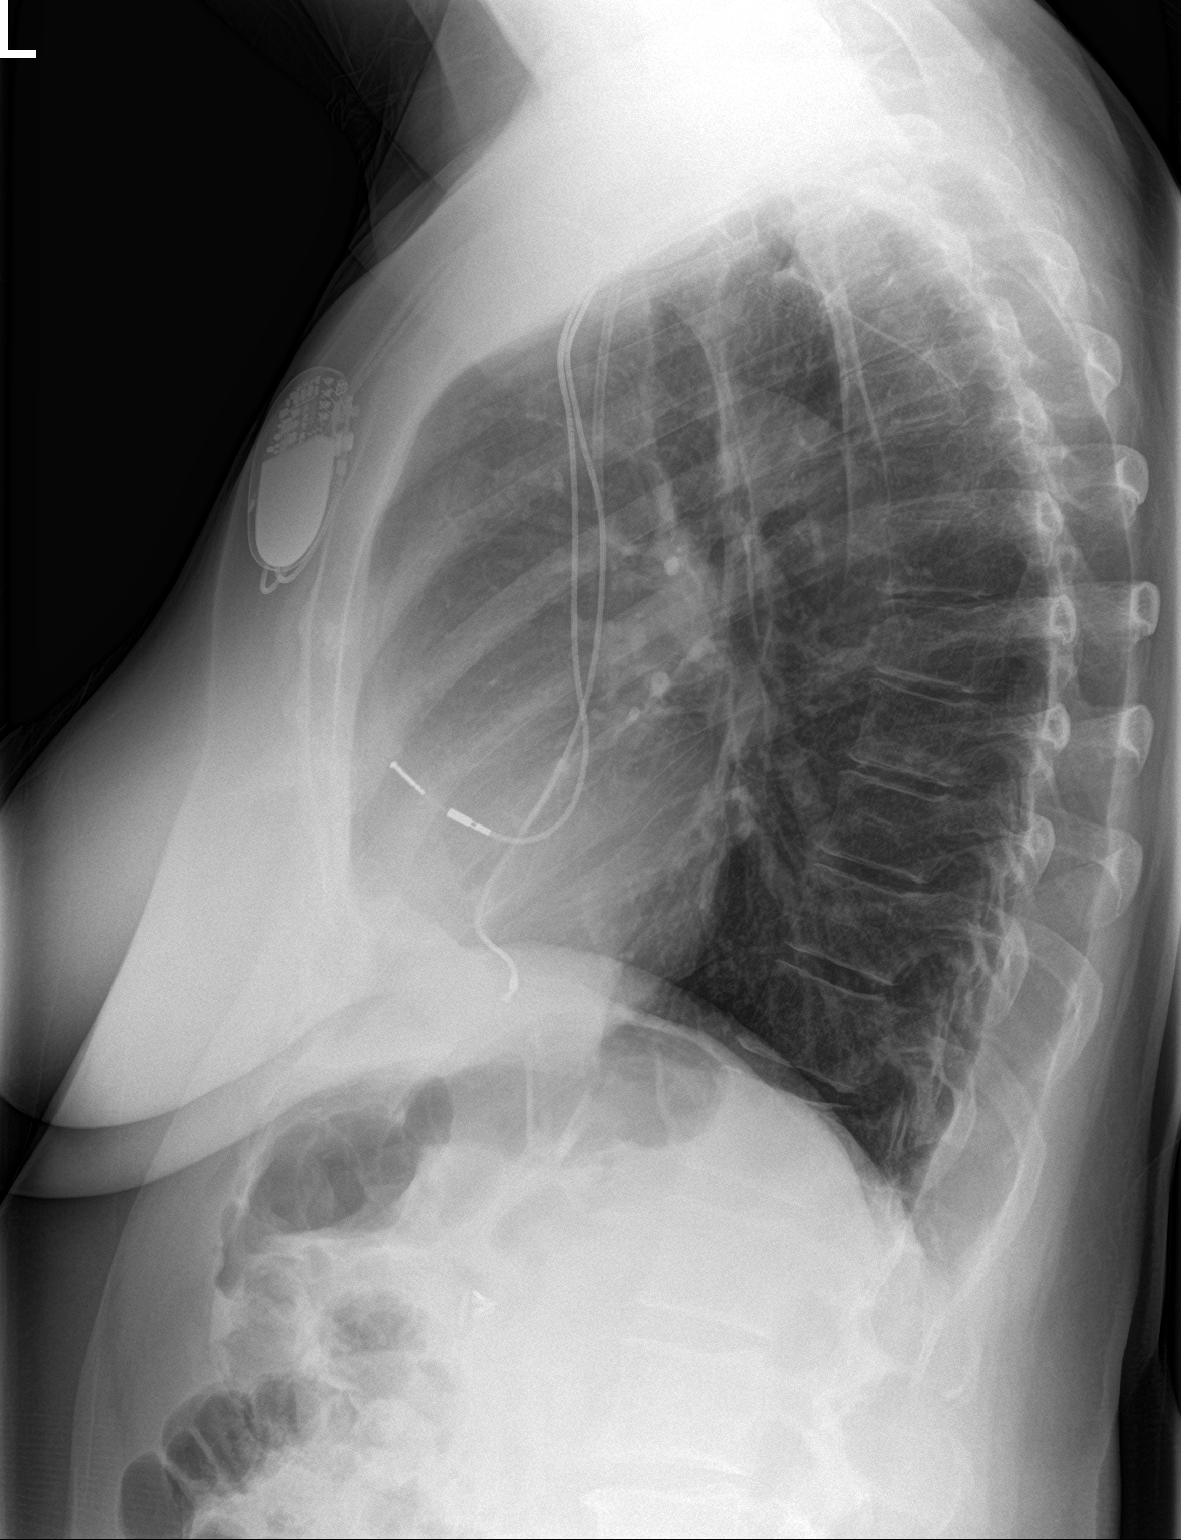

[2 of 2 positions shown; findings below may reference images not displayed]

FINDINGS: The heart size and mediastinal contours are within normal limits.
Transvenous pacemaker in appropriate position. Right-sided
Port-A-Cath also in expected position. No pneumothorax identified.
Both lungs are clear. The visualized skeletal structures are
unremarkable.
IMPRESSION: No active cardiopulmonary disease.

## 2019-05-15 DIAGNOSIS — Z85828 Personal history of other malignant neoplasm of skin: Secondary | ICD-10-CM | POA: Diagnosis not present

## 2019-05-15 DIAGNOSIS — L821 Other seborrheic keratosis: Secondary | ICD-10-CM | POA: Diagnosis not present

## 2019-05-15 DIAGNOSIS — D2272 Melanocytic nevi of left lower limb, including hip: Secondary | ICD-10-CM | POA: Diagnosis not present

## 2019-05-15 DIAGNOSIS — L570 Actinic keratosis: Secondary | ICD-10-CM | POA: Diagnosis not present

## 2019-05-15 DIAGNOSIS — Z8619 Personal history of other infectious and parasitic diseases: Secondary | ICD-10-CM | POA: Diagnosis not present

## 2019-05-15 DIAGNOSIS — D2261 Melanocytic nevi of right upper limb, including shoulder: Secondary | ICD-10-CM | POA: Diagnosis not present

## 2019-05-15 DIAGNOSIS — X32XXXA Exposure to sunlight, initial encounter: Secondary | ICD-10-CM | POA: Diagnosis not present

## 2019-05-15 DIAGNOSIS — D2262 Melanocytic nevi of left upper limb, including shoulder: Secondary | ICD-10-CM | POA: Diagnosis not present

## 2019-05-16 ENCOUNTER — Encounter: Payer: Self-pay | Admitting: Family Medicine

## 2019-05-16 ENCOUNTER — Other Ambulatory Visit: Payer: Self-pay | Admitting: Family Medicine

## 2019-05-16 NOTE — Telephone Encounter (Signed)
Pt states the pharmacy advised her compound medication for vaginal dryness had bee denied. She does not know the name of it, and I do not see Dr Wynetta Emery has ever prescribed this medication. She states [facility] faxed to you and it was denied.  Pt states she is out of this med and very uncomfortable.

## 2019-05-16 NOTE — Telephone Encounter (Signed)
Called [facility]. Spoke to a pharmacy tech and she states that the last RX is out of refills so they just need a new prescription. RX is listed in the chart as Prescription Medication. Please send to [facility]. Can take a verbal if need be.

## 2019-05-16 NOTE — Telephone Encounter (Signed)
RX called into [facility] per Dr. Durenda Age RX in chart.  Called and left patient a VM letting her know that the RX was called in for her.

## 2019-05-16 NOTE — Telephone Encounter (Signed)
Rx is under 03/05/19 on media- OK to call in Rx or if it's written on a Rx pad I'll be happy to sign it.

## 2019-05-16 NOTE — Telephone Encounter (Signed)
I don't think we got a refill request. Did the insurance decline it- it's a written Rx because it's a compound and it's not in the computer

## 2019-05-16 NOTE — Telephone Encounter (Signed)
Routing to provider. Is there anything else we can give the patient?

## 2019-05-21 ENCOUNTER — Other Ambulatory Visit: Payer: Self-pay

## 2019-05-21 ENCOUNTER — Encounter: Payer: Self-pay | Admitting: Oncology

## 2019-05-21 ENCOUNTER — Inpatient Hospital Stay (HOSPITAL_BASED_OUTPATIENT_CLINIC_OR_DEPARTMENT_OTHER): Payer: 59 | Admitting: Oncology

## 2019-05-21 ENCOUNTER — Inpatient Hospital Stay: Payer: 59 | Attending: Oncology

## 2019-05-21 VITALS — BP 111/45 | HR 88 | Temp 96.0°F | Resp 16 | Wt 143.6 lb

## 2019-05-21 DIAGNOSIS — Z79899 Other long term (current) drug therapy: Secondary | ICD-10-CM | POA: Diagnosis not present

## 2019-05-21 DIAGNOSIS — Z7982 Long term (current) use of aspirin: Secondary | ICD-10-CM | POA: Diagnosis not present

## 2019-05-21 DIAGNOSIS — Z950 Presence of cardiac pacemaker: Secondary | ICD-10-CM | POA: Insufficient documentation

## 2019-05-21 DIAGNOSIS — K219 Gastro-esophageal reflux disease without esophagitis: Secondary | ICD-10-CM | POA: Diagnosis not present

## 2019-05-21 DIAGNOSIS — Z85818 Personal history of malignant neoplasm of other sites of lip, oral cavity, and pharynx: Secondary | ICD-10-CM | POA: Insufficient documentation

## 2019-05-21 DIAGNOSIS — D649 Anemia, unspecified: Secondary | ICD-10-CM

## 2019-05-21 DIAGNOSIS — Z8589 Personal history of malignant neoplasm of other organs and systems: Secondary | ICD-10-CM | POA: Diagnosis not present

## 2019-05-21 DIAGNOSIS — I82402 Acute embolism and thrombosis of unspecified deep veins of left lower extremity: Secondary | ICD-10-CM | POA: Diagnosis not present

## 2019-05-21 DIAGNOSIS — Z86718 Personal history of other venous thrombosis and embolism: Secondary | ICD-10-CM | POA: Insufficient documentation

## 2019-05-21 DIAGNOSIS — E890 Postprocedural hypothyroidism: Secondary | ICD-10-CM | POA: Insufficient documentation

## 2019-05-21 DIAGNOSIS — Z08 Encounter for follow-up examination after completed treatment for malignant neoplasm: Secondary | ICD-10-CM | POA: Diagnosis not present

## 2019-05-21 LAB — CBC WITH DIFFERENTIAL/PLATELET
Abs Immature Granulocytes: 0.02 10*3/uL (ref 0.00–0.07)
Basophils Absolute: 0 10*3/uL (ref 0.0–0.1)
Basophils Relative: 1 %
Eosinophils Absolute: 0.1 10*3/uL (ref 0.0–0.5)
Eosinophils Relative: 1 %
HCT: 38.1 % (ref 36.0–46.0)
Hemoglobin: 12.8 g/dL (ref 12.0–15.0)
Immature Granulocytes: 0 %
Lymphocytes Relative: 15 %
Lymphs Abs: 0.8 10*3/uL (ref 0.7–4.0)
MCH: 31.2 pg (ref 26.0–34.0)
MCHC: 33.6 g/dL (ref 30.0–36.0)
MCV: 92.9 fL (ref 80.0–100.0)
Monocytes Absolute: 0.4 10*3/uL (ref 0.1–1.0)
Monocytes Relative: 7 %
Neutro Abs: 3.9 10*3/uL (ref 1.7–7.7)
Neutrophils Relative %: 76 %
Platelets: 125 10*3/uL — ABNORMAL LOW (ref 150–400)
RBC: 4.1 MIL/uL (ref 3.87–5.11)
RDW: 13 % (ref 11.5–15.5)
WBC: 5.2 10*3/uL (ref 4.0–10.5)
nRBC: 0 % (ref 0.0–0.2)

## 2019-05-21 LAB — COMPREHENSIVE METABOLIC PANEL
ALT: 22 U/L (ref 0–44)
AST: 21 U/L (ref 15–41)
Albumin: 3.9 g/dL (ref 3.5–5.0)
Alkaline Phosphatase: 70 U/L (ref 38–126)
Anion gap: 7 (ref 5–15)
BUN: 18 mg/dL (ref 8–23)
CO2: 28 mmol/L (ref 22–32)
Calcium: 9.3 mg/dL (ref 8.9–10.3)
Chloride: 105 mmol/L (ref 98–111)
Creatinine, Ser: 0.87 mg/dL (ref 0.44–1.00)
GFR calc Af Amer: >60 mL/min (ref >60)
GFR calc non Af Amer: >60 mL/min (ref >60)
Glucose, Bld: 116 mg/dL — ABNORMAL HIGH (ref 70–99)
Potassium: 3.5 mmol/L (ref 3.5–5.1)
Sodium: 140 mmol/L (ref 135–145)
Total Bilirubin: 1 mg/dL (ref 0.3–1.2)
Total Protein: 6.6 g/dL (ref 6.5–8.1)

## 2019-05-21 LAB — VITAMIN B12: Vitamin B-12: 274 pg/mL (ref 180–914)

## 2019-05-21 NOTE — Progress Notes (Signed)
Pt recently has shingles and it was her right side of head and goes down her neck. The shingles is better but still has sensitivity down the neck

## 2019-05-22 NOTE — Progress Notes (Signed)
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  She underwent concurrent chemoradiation with weekly cisplatin which she completed in February 2019.  She also had left lower extremity DVT after she finished chemotherapy and took Xarelto for 3 months following which she stopped taking it.  She continues to be in remission since then.  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  Her appetite and weight are stable.  Denies any lumps or bumps anywhere  ECOG PS- 1 Pain scale- 0   Review of systems- Review of Systems  Constitutional: Negative for chills, fever, malaise/fatigue and weight loss.  HENT: Negative for congestion, ear discharge and nosebleeds.   Eyes: Negative for blurred vision.  Respiratory: Negative for cough, hemoptysis, sputum production, shortness of breath and wheezing.   Cardiovascular: Negative for chest pain, palpitations, orthopnea and claudication.  Gastrointestinal: Negative for abdominal pain, blood in stool, constipation,  diarrhea, heartburn, melena, nausea and vomiting.  Genitourinary: Negative for dysuria, flank pain, frequency, hematuria and urgency.  Musculoskeletal: Negative for back pain, joint pain and myalgias.  Skin: Negative for rash.  Neurological: Negative for dizziness, tingling, focal weakness, seizures, weakness and headaches.  Endo/Heme/Allergies: Does not bruise/bleed easily.  Psychiatric/Behavioral: Negative for depression and suicidal ideas. The patient does not have insomnia.       Allergies  Allergen Reactions  . Cefdinir Swelling    Swelling in mouth   . Cephalosporins Swelling  . Dairy Aid [Lactase] Swelling    Milk and milk products cause swelling and tingling of tongue  . Flagyl [Metronidazole] Swelling    Mouth and throat and ears go red and burn  . Lactose Intolerance (Gi) Other (See Comments)    GI upset.  Eats only gluten free  . Penicillins Rash    Rash/hives  . Phenylephrine-Guaifenesin Anaphylaxis    Night terrors  . Sulfa Antibiotics Rash and Hives    Other reaction(s): Unknown   . Sulfonamide Derivatives Rash    Rash/itching.  From head to her toes, the itching was horrible. Penicillin is the worst  . Tape Rash    Adhesives all cause a problem severely.  It doesn't matter if it is paper tape or not.  Marland Kitchen Dexilant [Dexlansoprazole]     Pressure in head like head is in a vice and being squeezed  . Entex Lq [Phenylephrine-Guaifenesin]     Night terrors  . Other Other (See Comments)    Flu vaccine:  Weakness, dizziness, tia type symptoms.    . Oxycodone Nausea And Vomiting    And itching  . Succinylcholine Other (See Comments)    Trouble waking up  . Vancomycin Rash  .  Tetracycline Other (See Comments)    Unkown  . Codeine Nausea And Vomiting  . Guaifenesin & Derivatives Other (See Comments)    Night terrors  . Lactobacillus Itching  . Naproxen Sodium     Itching/rash  . Pseudoephedrine Rash  . Tetracyclines & Related Other (See Comments)    Was taking  flagyl at the same time; unsure which caused the swelling  Swelling of throat. Pharmacist states that he believes it was the flagyl and not the tetracycline  . Wheat Bran Other (See Comments)    Upset stomach. Now eats gluten free     Past Medical History:  Diagnosis Date  . AA (aortic aneurysm) ([place])    a. 04/2011 Echo: Ao Root: 4.1cm, Asc Ao 4.7cm.  . Anxiety   . AV block, 2nd degree    a. 05/2005 - s/p MDT Adapta ADDR01 Dual Chamber PPM  . Bicuspid aortic valve   . Cancer ([place])    Throat  . Chest pain    a. 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  Marland Kitchen Deep vein thrombosis (DVT) of left lower extremity ([place]) 10/03/2017  . Dehydration 08/11/2017  . Depression   . Expressive aphasia    a. ongoing since 04/2011 - seen by neurology - ? TIA vs. Migraine  . Facial numbness    a. ongoing since 04/2011 - seen by neurology - ? TIA vs. Migraine  . GERD (gastroesophageal reflux disease)   . Low blood pressure   . Personal history of chemotherapy 2018-2019   Throat  . Personal history of radiation therapy 2018-2019   Throat  . Presence of permanent cardiac pacemaker   . Shingles    pt had on right side of head and his neck  . 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 Syncope    a. 04/2011 Echo: EF 55-65%, No RWMA, Gr 1 DD.  Marland Kitchen Thyroid disease      Past Surgical History:  Procedure Laterality Date  . ABDOMINAL WALL MESH  REMOVAL    . bladder tack    . cataract Bilateral 2004  . CHOLECYSTECTOMY  2000  . DILATION AND CURETTAGE OF UTERUS    . DIRECT LARYNGOSCOPY Right 06/16/2017   Procedure: MICRO DIRECT LARYNGOSCOPY WITH BIOPSY OF RIGHT BASE OF TONGUE;  Surgeon: Carloyn Manner, MD;  Location: [facility];  Service: ENT;  Laterality: Right;  . EP IMPLANTABLE DEVICE N/A 06/04/2015   Procedure: PPM Generator Changeout;  Surgeon: Deboraha Sprang, MD;  Location: [place] [facility];  Service: Cardiovascular;  Laterality: N/A;  . ESOPHAGOGASTRODUODENOSCOPY (EGD) WITH PROPOFOL N/A  12/18/2015   Procedure: ESOPHAGOGASTRODUODENOSCOPY (EGD) WITH gastric biopsy and dilation;  Surgeon: Lucilla Lame, MD;  Location: [place];  Service: Endoscopy;  Laterality: N/A;  . EYE SURGERY    . INSERT / REPLACE / REMOVE PACEMAKER    . IR FLUORO GUIDE PORT INSERTION RIGHT  06/22/2017  . IR REMOVAL TUN ACCESS W/ PORT W/O FL MOD SED  04/14/2018  . MOHS SURGERY  02/2018  . PACEMAKER INSERTION  2006   Medtronic Adapta ADDR01  . TONSILLECTOMY    . TONSILLECTOMY    . VAGINAL HYSTERECTOMY  2013   mad/cope    Social History   Socioeconomic History  . Marital status: Married    Spouse name: Not on file  . Number of children: Not on file  . Years of education: Not on file  . Highest education level: Not on file  Occupational History  .  Not on file  Social Needs  . Financial resource strain: Not very hard  . Food insecurity    Worry: Never true    Inability: Never true  . Transportation needs    Medical: No    Non-medical: No  Tobacco Use  . Smoking status: Never Smoker  . Smokeless tobacco: Never Used  . Tobacco comment: tobacco use -no  Substance and Sexual Activity  . Alcohol use: No  . Drug use: No  . Sexual activity: Yes    Birth control/protection: Surgical  Lifestyle  . Physical activity    Days per week: 4 days    Minutes per session: 60 min  . Stress: Rather much  Relationships  . Social connections    Talks on phone: More than three times a week    Gets together: Not on file    Attends religious service: More than 4 times per year    Active member of club or organization: Not on file    Attends meetings of clubs or organizations: Not on file    Relationship status: Married  . Intimate partner violence    Fear of current or ex partner: No    Emotionally abused: No    Physically abused: No    Forced sexual activity: No  Other Topics Concern  . Not on file  Social History Narrative   Lives in [place] with husband.  Works out regularly.   Works as a Geophysicist/field seismologist - owns own business.     Family History  Problem Relation Age of Onset  . COPD Mother        alive @ 49  . Atrial fibrillation Mother   . Stroke Father        died @ 81  . Breast cancer Maternal Aunt 70  . COPD Maternal Grandmother   . Ovarian cancer Neg Hx   . Diabetes Neg Hx      Current Outpatient Medications:  .  aspirin 81 MG tablet, Take 81 mg by mouth daily., Disp: , Rfl:  .  b complex vitamins tablet, Take 2 tablets by mouth daily. , Disp: , Rfl:  .  BLACK ELDERBERRY,BERRY-FLOWER, PO, Take 2 capsules by mouth daily. , Disp: , Rfl:  .  Calcium Carb-Cholecalciferol (CALCIUM 500 + D3 PO), Take 2 Doses by mouth daily. , Disp: , Rfl:  .  CRANBERRY SOFT PO, Take 2 each by mouth daily. , Disp: , Rfl:  .  Digestive Enzymes (DIGESTIVE ENZYME PO), Take 1-2 capsules by mouth daily., Disp: , Rfl:  .  fexofenadine (ALLEGRA) 60 MG tablet, Take 60 mg by mouth at bedtime., Disp: , Rfl:  .  levothyroxine (SYNTHROID) 75 MCG tablet, Take 1 tablet (75 mcg total) by mouth daily. BRAND NAME SYNTHROID NECESSARY, Disp: 90 tablet, Rfl: 3 .  Loratadine (CLARITIN PO), Take by mouth daily., Disp: , Rfl:  .  nortriptyline (PAMELOR) 25 MG capsule, Take 1 capsule (25 mg total) by mouth at bedtime., Disp: 30 capsule, Rfl: 1 .  PRESCRIPTION MEDICATION, Place 1 tablet vaginally 3 (three) times a week. E3 1mg  and Testosterone 1mg  compound, Disp: , Rfl:  No current facility-administered medications for this visit.   Facility-Administered Medications Ordered in Other Visits:  .  0.9 %  sodium chloride infusion, , Intravenous, Once, Sindy Guadeloupe, MD  Physical exam:  Vitals:   05/21/19 1419  BP: (!) 111/45  Pulse: 88  Resp: 16  Temp: (!) 96 F (35.6 C)  TempSrc: Tympanic  Weight: 143 lb 9.6 oz (65.1 kg)   Physical Exam HENT:     Head: Normocephalic and atraumatic.     Mouth/Throat:     Mouth: Mucous membranes are moist.     Pharynx: Oropharynx is clear.  Eyes:      Pupils: Pupils are equal, round, and reactive to light.  Neck:     Musculoskeletal: Normal range of motion.  Cardiovascular:     Rate and Rhythm: Normal rate and regular rhythm.     Heart sounds: Normal heart sounds.  Pulmonary:     Effort: Pulmonary effort is normal.     Breath sounds: Normal breath sounds.  Abdominal:     General: Bowel sounds are normal.     Palpations: Abdomen is soft.  Lymphadenopathy:     Comments: No palpable cervical adenopathy  Skin:    General: Skin is warm and dry.  Neurological:     Mental Status: She is alert and oriented to person, place, and time.      CMP Latest Ref Rng & Units 05/21/2019  Glucose 70 - 99 mg/dL 116(H)  BUN 8 - 23 mg/dL 18  Creatinine 0.44 - 1.00 mg/dL 0.87  Sodium 135 - 145 mmol/L 140  Potassium 3.5 - 5.1 mmol/L 3.5  Chloride 98 - 111 mmol/L 105  CO2 22 - 32 mmol/L 28  Calcium 8.9 - 10.3 mg/dL 9.3  Total Protein 6.5 - 8.1 g/dL 6.6  Total Bilirubin 0.3 - 1.2 mg/dL 1.0  Alkaline Phos 38 - 126 U/L 70  AST 15 - 41 U/L 21  ALT 0 - 44 U/L 22   CBC Latest Ref Rng & Units 05/21/2019  WBC 4.0 - 10.5 K/uL 5.2  Hemoglobin 12.0 - 15.0 g/dL 12.8  Hematocrit 36.0 - 46.0 % 38.1  Platelets 150 - 400 K/uL 125(L)      Assessment and plan- Patient is a 63 y.o. female with a history of HPV positive squamous cell carcinoma of the oropharynx stage I status post concurrent chemoradiation and currently in remission.    She is here for routine follow-up  Clinically patient is doing well and no concerning signs and symptoms of recurrence based on today's exam.  She will be seeing Dr. Pryor Ochoa every 6 months as well.  I will see her back in 1 years time  Patient does have mild thrombocytopenia since she finished chemotherapy which has remained stable between 1 20-1 40.  Continue to monitor I will check an interim CBC in 6 months   Visit Diagnosis 1. Encounter for follow-up surveillance of head and neck cancer      Dr. Randa Evens, MD, MPH  [facility] at [facility] [id number] 05/22/2019 12:46 PM

## 2019-05-23 ENCOUNTER — Encounter: Payer: Self-pay | Admitting: Family Medicine

## 2019-06-01 ENCOUNTER — Ambulatory Visit
Admission: RE | Admit: 2019-06-01 | Discharge: 2019-06-01 | Disposition: A | Discharge status: Home / Self Care | Payer: 59 | Source: Ambulatory Visit | Attending: Family Medicine | Admitting: Family Medicine

## 2019-06-01 DIAGNOSIS — Z1231 Encounter for screening mammogram for malignant neoplasm of breast: Secondary | ICD-10-CM | POA: Diagnosis not present

## 2019-06-12 ENCOUNTER — Ambulatory Visit (INDEPENDENT_AMBULATORY_CARE_PROVIDER_SITE_OTHER): Payer: 59 | Admitting: *Deleted

## 2019-06-12 DIAGNOSIS — I441 Atrioventricular block, second degree: Secondary | ICD-10-CM | POA: Diagnosis not present

## 2019-06-12 LAB — CUP PACEART REMOTE DEVICE CHECK
Battery Impedance: 188 Ohm
Battery Remaining Longevity: 137 mo
Battery Voltage: 2.78 V
Brady Statistic AP VP Percent: 0 %
Brady Statistic AP VS Percent: 15 %
Brady Statistic AS VP Percent: 0 %
Brady Statistic AS VS Percent: 85 %
Date Time Interrogation Session: 20201201054658
Implantable Lead Implant Date: 20061128
Implantable Lead Implant Date: 20061128
Implantable Lead Location: 753859
Implantable Lead Location: 753860
Implantable Lead Model: 4092
Implantable Lead Model: 4592
Implantable Pulse Generator Implant Date: 20161123
Lead Channel Impedance Value: 507 Ohm
Lead Channel Impedance Value: 701 Ohm
Lead Channel Pacing Threshold Amplitude: 0.375 V
Lead Channel Pacing Threshold Amplitude: 1.125 V
Lead Channel Pacing Threshold Pulse Width: 0.4 ms
Lead Channel Pacing Threshold Pulse Width: 0.4 ms
Lead Channel Setting Pacing Amplitude: 2 V
Lead Channel Setting Pacing Amplitude: 2.5 V
Lead Channel Setting Pacing Pulse Width: 0.4 ms
Lead Channel Setting Sensing Sensitivity: 4 mV

## 2019-06-19 ENCOUNTER — Encounter: Payer: Self-pay | Admitting: Internal Medicine

## 2019-06-19 ENCOUNTER — Other Ambulatory Visit: Payer: Self-pay

## 2019-06-19 ENCOUNTER — Ambulatory Visit (INDEPENDENT_AMBULATORY_CARE_PROVIDER_SITE_OTHER): Payer: 59 | Admitting: Internal Medicine

## 2019-06-19 VITALS — BP 100/80 | HR 68 | Ht 63.0 in | Wt 141.8 lb

## 2019-06-19 DIAGNOSIS — Z950 Presence of cardiac pacemaker: Secondary | ICD-10-CM

## 2019-06-19 DIAGNOSIS — I441 Atrioventricular block, second degree: Secondary | ICD-10-CM | POA: Diagnosis not present

## 2019-06-19 LAB — CUP PACEART INCLINIC DEVICE CHECK
Battery Impedance: 163 Ohm
Battery Remaining Longevity: 142 mo
Battery Voltage: 2.78 V
Brady Statistic AP VP Percent: 0 %
Brady Statistic AP VS Percent: 15 %
Brady Statistic AS VP Percent: 0 %
Brady Statistic AS VS Percent: 85 %
Date Time Interrogation Session: 20201208095524
Implantable Lead Implant Date: 20061128
Implantable Lead Implant Date: 20061128
Implantable Lead Location: 753859
Implantable Lead Location: 753860
Implantable Lead Model: 4092
Implantable Lead Model: 4592
Implantable Pulse Generator Implant Date: 20161123
Lead Channel Impedance Value: 457 Ohm
Lead Channel Impedance Value: 728 Ohm
Lead Channel Pacing Threshold Amplitude: 0.5 V
Lead Channel Pacing Threshold Amplitude: 1.25 V
Lead Channel Pacing Threshold Pulse Width: 0.4 ms
Lead Channel Pacing Threshold Pulse Width: 0.4 ms
Lead Channel Sensing Intrinsic Amplitude: 11.2 mV
Lead Channel Sensing Intrinsic Amplitude: 4 mV
Lead Channel Setting Pacing Amplitude: 2 V
Lead Channel Setting Pacing Amplitude: 2.5 V
Lead Channel Setting Pacing Pulse Width: 0.4 ms
Lead Channel Setting Sensing Sensitivity: 4 mV

## 2019-06-19 NOTE — Progress Notes (Signed)
skf       Patient Care Team: Valerie Roys, DO as PCP - General (Family Medicine) Rockey Situ Kathlene November, MD as Consulting Physician (Cardiology) Sindy Guadeloupe, MD as Consulting Physician (Oncology) Carloyn Manner, MD as Referring Physician (Otolaryngology) Noreene Filbert, MD as Referring Physician (Radiation Oncology)   HPI  Wendy Kelly is a 63 y.o. female Seen in followup for a pacemaker implanted for syncope with intermittent second degree heart block. She received a Medtronic adapta device in 2006 and underwent generator replacement 11/16.      Hx of TIA,  Orthostatic intolerance   Date  Aorta  12/15 CTA 4.6  6/18 CTA 4.8  5/19 PET 4.3  7/20 CTA 4.8       She has intercurrently been seen by Dr. Ron Agee. Now on 6 m surveillance  Cancer therapy was complicated by DVT for which she took anticoagulation for 6 months.  The patient denies chest pain, shortness of breath, nocturnal dyspnea, orthopnea or peripheral edema.  There have been no palpitations, lightheadedness or syncope.     DATE TEST EF   12/15 echo  50-55  mod ascending aortic dilitation 36 mm  1/16 myoview   EF 40-"normal"  no ischemia  1/18 Echo  55-60                  Past Medical History:  Diagnosis Date  . AA (aortic aneurysm) ([place])    a. 04/2011 Echo: Ao Root: 4.1cm, Asc Ao 4.7cm.  . Anxiety   . AV block, 2nd degree    a. 05/2005 - s/p MDT Adapta ADDR01 Dual Chamber PPM  . Bicuspid aortic valve   . Cancer ([place])    Throat  . Chest pain    a. 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  Marland Kitchen Deep vein thrombosis (DVT) of left lower extremity ([place]) 10/03/2017  . Dehydration 08/11/2017  . Depression   . Expressive aphasia    a. ongoing since 04/2011 - seen by neurology - ? TIA vs. Migraine  . Facial numbness    a. ongoing since 04/2011 - seen by neurology - ? TIA vs. Migraine  . GERD (gastroesophageal reflux disease)   . Low blood pressure   . Personal history of chemotherapy 2018-2019   Throat  . Personal  history of radiation therapy 2018-2019   Throat  . Presence of permanent cardiac pacemaker   . Shingles    pt had on right side of head and his neck  . 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 Syncope    a. 04/2011 Echo: EF 55-65%, No RWMA, Gr 1 DD.  Marland Kitchen Thyroid disease     Past Surgical History:  Procedure Laterality Date  . ABDOMINAL WALL MESH  REMOVAL    . bladder tack    . cataract Bilateral 2004  . CHOLECYSTECTOMY  2000  . DILATION AND CURETTAGE OF UTERUS    . DIRECT LARYNGOSCOPY Right 06/16/2017   Procedure: MICRO DIRECT LARYNGOSCOPY WITH BIOPSY OF RIGHT BASE OF TONGUE;  Surgeon: Carloyn Manner, MD;  Location: [facility];  Service: ENT;  Laterality: Right;  . EP IMPLANTABLE DEVICE N/A 06/04/2015   Procedure: PPM Generator Changeout;  Surgeon: Deboraha Sprang, MD;  Location: [place] [facility];  Service: Cardiovascular;  Laterality: N/A;  . ESOPHAGOGASTRODUODENOSCOPY (EGD) WITH PROPOFOL N/A 12/18/2015   Procedure: ESOPHAGOGASTRODUODENOSCOPY (EGD) WITH gastric biopsy and dilation;  Surgeon: Lucilla Lame, MD;  Location: [facility];  Service: Endoscopy;  Laterality:  N/A;  . EYE SURGERY    . INSERT / REPLACE / REMOVE PACEMAKER    . IR FLUORO GUIDE PORT INSERTION RIGHT  06/22/2017  . IR REMOVAL TUN ACCESS W/ PORT W/O FL MOD SED  04/14/2018  . MOHS SURGERY  02/2018  . PACEMAKER INSERTION  2006   Medtronic Adapta ADDR01  . TONSILLECTOMY    . TONSILLECTOMY    . VAGINAL HYSTERECTOMY  2013   mad/cope    Current Outpatient Medications  Medication Sig Dispense Refill  . aspirin 81 MG tablet Take 81 mg by mouth daily.    Marland Kitchen b complex vitamins tablet Take 2 tablets by mouth daily.     Marland Kitchen BLACK ELDERBERRY,BERRY-FLOWER, PO Take 2 capsules by mouth daily.     . Calcium Carb-Cholecalciferol (CALCIUM 500 + D3 PO) Take 2 Doses by mouth daily.     Marland Kitchen CRANBERRY SOFT PO Take 2 each by mouth daily.     . Digestive Enzymes (DIGESTIVE ENZYME PO) Take 1-2 capsules by  mouth daily.    . fexofenadine (ALLEGRA) 60 MG tablet Take 60 mg by mouth at bedtime.    Marland Kitchen levothyroxine (SYNTHROID) 75 MCG tablet Take 1 tablet (75 mcg total) by mouth daily. BRAND NAME SYNTHROID NECESSARY 90 tablet 3  . Loratadine (CLARITIN PO) Take by mouth daily.    Marland Kitchen PRESCRIPTION MEDICATION Place 1 tablet vaginally 3 (three) times a week. E3 1mg  and Testosterone 1mg  compound     No current facility-administered medications for this visit.    Facility-Administered Medications Ordered in Other Visits  Medication Dose Route Frequency Provider Last Rate Last Dose  . 0.9 %  sodium chloride infusion   Intravenous Once Sindy Guadeloupe, MD        Allergies  Allergen Reactions  . Cefdinir Swelling    Swelling in mouth   . Cephalosporins Swelling  . Dairy Aid [Lactase] Swelling    Milk and milk products cause swelling and tingling of tongue  . Flagyl [Metronidazole] Swelling    Mouth and throat and ears go red and burn  . Lactose Intolerance (Gi) Other (See Comments)    GI upset.  Eats only gluten free  . Penicillins Rash    Rash/hives  . Phenylephrine-Guaifenesin Anaphylaxis    Night terrors  . Sulfa Antibiotics Rash and Hives    Other reaction(s): Unknown   . Sulfonamide Derivatives Rash    Rash/itching.  From head to her toes, the itching was horrible. Penicillin is the worst  . Tape Rash    Adhesives all cause a problem severely.  It doesn't matter if it is paper tape or not.  Marland Kitchen Dexilant [Dexlansoprazole]     Pressure in head like head is in a vice and being squeezed  . Entex Lq [Phenylephrine-Guaifenesin]     Night terrors  . Other Other (See Comments)    Flu vaccine:  Weakness, dizziness, tia type symptoms.    . Oxycodone Nausea And Vomiting    And itching  . Succinylcholine Other (See Comments)    Trouble waking up  . Vancomycin Rash  . Tetracycline Other (See Comments)    Unkown  . Codeine Nausea And Vomiting  . Guaifenesin & Derivatives Other (See Comments)     Night terrors  . Lactobacillus Itching  . Naproxen Sodium     Itching/rash  . Pseudoephedrine Rash  . Tetracyclines & Related Other (See Comments)    Was taking flagyl at the same time; unsure which caused the swelling  Swelling of throat. Pharmacist states that he believes it was the flagyl and not the tetracycline  . Wheat Bran Other (See Comments)    Upset stomach. Now eats gluten free    Review of Systems negative except from HPI and PMH  Physical Exam BP 100/80 (BP Location: Left Arm, Patient Position: Sitting, Cuff Size: Normal)   Pulse 68   Ht 5\' 3"  (1.6 m)   Wt 141 lb 12 oz (64.3 kg)   SpO2 99%   BMI 25.11 kg/m  Well developed and well nourished in no acute distress HENT normal Neck supple with JVP-flat Clear Device pocket well healed; without hematoma or erythema.  There is no tethering  Regular rate and rhythm, no   murmur Abd-soft with active BS No Clubbing cyanosis   edema Skin-warm and dry A & Oriented  Grossly normal sensory and motor function  ECG sinus at 68 Interval 16/09/39 Axis left -51   Assessment and  Plan  Syncope  Intermittent second degree heart block  Pacemaker-[employer] The patient's device was interrogated.  The information was reviewed. No changes were made in the programming.      Ascending aortic aneurysm positive family history  TIA    Cancer S/P CHEMO XRT   Device function is normal  Reviewed Dr. Osker Mason note.  We will see her again in 1 year.  I have reached out to him as to his recommendations on quinolone therapy in this cohort.

## 2019-06-19 NOTE — Patient Instructions (Signed)
Medication Instructions:  - Your physician recommends that you continue on your current medications as directed. Please refer to the Current Medication list given to you today.  *If you need a refill on your cardiac medications before your next appointment, please call your pharmacy*  Lab Work: - none ordered  If you have labs (blood work) drawn today and your tests are completely normal, you will receive your results only by: . MyChart Message (if you have MyChart) OR . A paper copy in the mail If you have any lab test that is abnormal or we need to change your treatment, we will call you to review the results.  Testing/Procedures: - none ordered  Follow-Up: At [facility], you and your health needs are our priority.  As part of our continuing mission to provide you with exceptional heart care, we have created designated Provider Care Teams.  These Care Teams include your primary Cardiologist (physician) and Advanced Practice Providers (APPs -  Physician Assistants and Nurse Practitioners) who all work together to provide you with the care you need, when you need it.  Your next appointment:   1 year(s)  The format for your next appointment:   In Person  Provider:   Steven Klein, MD  Other Instructions - n/a  

## 2019-06-22 ENCOUNTER — Other Ambulatory Visit
Admission: RE | Admit: 2019-06-22 | Discharge: 2019-06-22 | Disposition: A | Discharge status: Home / Self Care | Payer: 59 | Source: Ambulatory Visit | Attending: Gastroenterology | Admitting: Gastroenterology

## 2019-06-22 ENCOUNTER — Other Ambulatory Visit: Payer: Self-pay

## 2019-06-22 DIAGNOSIS — Z20828 Contact with and (suspected) exposure to other viral communicable diseases: Secondary | ICD-10-CM | POA: Insufficient documentation

## 2019-06-22 DIAGNOSIS — Z01812 Encounter for preprocedural laboratory examination: Secondary | ICD-10-CM | POA: Diagnosis not present

## 2019-06-22 LAB — SARS CORONAVIRUS 2 (TAT 6-24 HRS): SARS Coronavirus 2: NEGATIVE

## 2019-06-25 DIAGNOSIS — Z85810 Personal history of malignant neoplasm of tongue: Secondary | ICD-10-CM | POA: Diagnosis not present

## 2019-06-25 DIAGNOSIS — H6121 Impacted cerumen, right ear: Secondary | ICD-10-CM | POA: Diagnosis not present

## 2019-06-26 ENCOUNTER — Ambulatory Visit: Payer: 59 | Admitting: Anesthesiology

## 2019-06-26 ENCOUNTER — Ambulatory Visit
Admission: RE | Admit: 2019-06-26 | Discharge: 2019-06-26 | Disposition: A | Discharge status: Home / Self Care | Payer: 59 | Attending: Gastroenterology | Admitting: Gastroenterology

## 2019-06-26 ENCOUNTER — Encounter
Admission: RE | Disposition: A | Discharge status: Home / Self Care | Payer: Self-pay | Source: Home / Self Care | Attending: Gastroenterology

## 2019-06-26 ENCOUNTER — Other Ambulatory Visit: Payer: Self-pay

## 2019-06-26 ENCOUNTER — Encounter: Payer: Self-pay | Admitting: Gastroenterology

## 2019-06-26 DIAGNOSIS — Z8521 Personal history of malignant neoplasm of larynx: Secondary | ICD-10-CM | POA: Diagnosis not present

## 2019-06-26 DIAGNOSIS — Z7982 Long term (current) use of aspirin: Secondary | ICD-10-CM | POA: Insufficient documentation

## 2019-06-26 DIAGNOSIS — E039 Hypothyroidism, unspecified: Secondary | ICD-10-CM | POA: Diagnosis not present

## 2019-06-26 DIAGNOSIS — Z86718 Personal history of other venous thrombosis and embolism: Secondary | ICD-10-CM | POA: Insufficient documentation

## 2019-06-26 DIAGNOSIS — Z9221 Personal history of antineoplastic chemotherapy: Secondary | ICD-10-CM | POA: Insufficient documentation

## 2019-06-26 DIAGNOSIS — Z923 Personal history of irradiation: Secondary | ICD-10-CM | POA: Insufficient documentation

## 2019-06-26 DIAGNOSIS — K640 First degree hemorrhoids: Secondary | ICD-10-CM | POA: Insufficient documentation

## 2019-06-26 DIAGNOSIS — Z8249 Family history of ischemic heart disease and other diseases of the circulatory system: Secondary | ICD-10-CM | POA: Diagnosis not present

## 2019-06-26 DIAGNOSIS — K5730 Diverticulosis of large intestine without perforation or abscess without bleeding: Secondary | ICD-10-CM | POA: Diagnosis not present

## 2019-06-26 DIAGNOSIS — D124 Benign neoplasm of descending colon: Secondary | ICD-10-CM | POA: Insufficient documentation

## 2019-06-26 DIAGNOSIS — Z1211 Encounter for screening for malignant neoplasm of colon: Secondary | ICD-10-CM

## 2019-06-26 DIAGNOSIS — D122 Benign neoplasm of ascending colon: Secondary | ICD-10-CM | POA: Diagnosis not present

## 2019-06-26 DIAGNOSIS — Z8579 Personal history of other malignant neoplasms of lymphoid, hematopoietic and related tissues: Secondary | ICD-10-CM | POA: Insufficient documentation

## 2019-06-26 DIAGNOSIS — Z950 Presence of cardiac pacemaker: Secondary | ICD-10-CM | POA: Diagnosis not present

## 2019-06-26 DIAGNOSIS — K635 Polyp of colon: Secondary | ICD-10-CM

## 2019-06-26 DIAGNOSIS — Z823 Family history of stroke: Secondary | ICD-10-CM | POA: Diagnosis not present

## 2019-06-26 DIAGNOSIS — I719 Aortic aneurysm of unspecified site, without rupture: Secondary | ICD-10-CM | POA: Diagnosis not present

## 2019-06-26 DIAGNOSIS — Z79890 Hormone replacement therapy (postmenopausal): Secondary | ICD-10-CM | POA: Diagnosis not present

## 2019-06-26 DIAGNOSIS — D126 Benign neoplasm of colon, unspecified: Secondary | ICD-10-CM | POA: Diagnosis not present

## 2019-06-26 DIAGNOSIS — K219 Gastro-esophageal reflux disease without esophagitis: Secondary | ICD-10-CM | POA: Diagnosis not present

## 2019-06-26 DIAGNOSIS — Z79899 Other long term (current) drug therapy: Secondary | ICD-10-CM | POA: Diagnosis not present

## 2019-06-26 DIAGNOSIS — K5790 Diverticulosis of intestine, part unspecified, without perforation or abscess without bleeding: Secondary | ICD-10-CM | POA: Diagnosis not present

## 2019-06-26 HISTORY — PX: COLONOSCOPY WITH PROPOFOL: SHX5780

## 2019-06-26 SURGERY — COLONOSCOPY WITH PROPOFOL
Anesthesia: General

## 2019-06-26 MED ORDER — LIDOCAINE HCL (CARDIAC) PF 100 MG/5ML IV SOSY
PREFILLED_SYRINGE | INTRAVENOUS | Status: DC | PRN
  Administered 2019-06-26: 50 mg via INTRAVENOUS

## 2019-06-26 MED ORDER — SODIUM CHLORIDE 0.9 % IV SOLN: INTRAVENOUS | Status: DC

## 2019-06-26 MED ORDER — PROPOFOL 10 MG/ML IV BOLUS
INTRAVENOUS | Status: DC | PRN
  Administered 2019-06-26: 70 mg via INTRAVENOUS

## 2019-06-26 MED ORDER — PROPOFOL 500 MG/50ML IV EMUL
INTRAVENOUS | Status: DC | PRN
  Administered 2019-06-26: 130 ug/kg/min via INTRAVENOUS

## 2019-06-26 MED ORDER — PHENYLEPHRINE HCL (PRESSORS) 10 MG/ML IV SOLN
INTRAVENOUS | Status: DC | PRN
  Administered 2019-06-26: 100 ug via INTRAVENOUS

## 2019-06-26 NOTE — Anesthesia Preprocedure Evaluation (Signed)
Anesthesia Evaluation  Patient identified by MRN, date of birth, ID band Patient awake    Reviewed: Allergy & Precautions, H&P , NPO status , Patient's Chart, lab work & pertinent test results, reviewed documented beta blocker date and time   Airway Mallampati: II  TM Distance: >3 FB Neck ROM: full    Dental  (+) Teeth Intact, Poor Dentition   Pulmonary neg pulmonary ROS, shortness of breath and with exertion,    Pulmonary exam normal        Cardiovascular Exercise Tolerance: Good + Peripheral Vascular Disease  negative cardio ROS Normal cardiovascular exam+ dysrhythmias + pacemaker  Rhythm:regular Rate:Normal     Neuro/Psych PSYCHIATRIC DISORDERS Anxiety Depression TIAnegative neurological ROS  negative psych ROS   GI/Hepatic negative GI ROS, Neg liver ROS, GERD  Medicated,  Endo/Other  Hypothyroidism   Renal/GU negative Renal ROS  negative genitourinary   Musculoskeletal   Abdominal   Peds  Hematology negative hematology ROS (+)   Anesthesia Other Findings Past Medical History: No date: AA (aortic aneurysm) ([place])     Comment:  a. 04/2011 Echo: Ao Root: 4.1cm, Asc Ao 4.7cm. No date: Anxiety No date: AV block, 2nd degree     Comment:  a. 05/2005 - s/p MDT Adapta ADDR01 Dual Chamber PPM No date: Bicuspid aortic valve No date: Cancer ([place]) No date: Chest pain     Comment:  a. 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 No date: Depression No date: Expressive aphasia     Comment:  a. ongoing since 04/2011 - seen by neurology - ? TIA vs.              Migraine No date: Facial numbness     Comment:  a. ongoing since 04/2011 - seen by neurology - ? TIA vs.              Migraine No date: GERD (gastroesophageal reflux disease) No date: Low blood pressure No date: Presence of permanent cardiac pacemaker 05/2017: Squamous cell carcinoma     Comment:  lymph node right side of neck No date: Syncope     Comment:  a. 04/2011 Echo: EF  55-65%, No RWMA, Gr 1 DD. Past Surgical History: No date: ABDOMINAL WALL MESH  REMOVAL No date: bladder tack 2004: cataract; Bilateral 2000: CHOLECYSTECTOMY No date: DILATION AND CURETTAGE OF UTERUS 06/04/2015: EP IMPLANTABLE DEVICE; N/A     Comment:  Procedure: PPM Generator Changeout;  Surgeon: Deboraha Sprang, MD;  Location: [place] [facility];  Service:               Cardiovascular;  Laterality: N/A; 12/18/2015: ESOPHAGOGASTRODUODENOSCOPY (EGD) WITH PROPOFOL; N/A     Comment:  Procedure: ESOPHAGOGASTRODUODENOSCOPY (EGD) WITH gastric              biopsy and dilation;  Surgeon: Lucilla Lame, MD;                Location: [place];  Service: Endoscopy;                Laterality: N/A; No date: EYE SURGERY No date: INSERT / REPLACE / REMOVE PACEMAKER 2006: PACEMAKER INSERTION     Comment:  Medtronic Adapta ADDR01 No date: TONSILLECTOMY No date: TONSILLECTOMY 2013: VAGINAL HYSTERECTOMY     Comment:  mad/cope BMI    Body Mass Index:  30.55 kg/m     Reproductive/Obstetrics negative OB ROS  Anesthesia Physical  Anesthesia Plan  ASA: III  Anesthesia Plan:    Post-op Pain Management:    Induction: Intravenous  PONV Risk Score and Plan: Propofol infusion  Airway Management Planned: Nasal Cannula  Additional Equipment:   Intra-op Plan:   Post-operative Plan:   Informed Consent: I have reviewed the patients History and Physical, chart, labs and discussed the procedure including the risks, benefits and alternatives for the proposed anesthesia with the patient or authorized representative who has indicated his/her understanding and acceptance.     Dental Advisory Given  Plan Discussed with: CRNA  Anesthesia Plan Comments:         Anesthesia Quick Evaluation

## 2019-06-26 NOTE — Transfer of Care (Signed)
Immediate Anesthesia Transfer of Care Note  Patient: Wendy Kelly  Procedure(s) Performed: COLONOSCOPY WITH PROPOFOL (N/A )  Patient Location: PACU and Endoscopy Unit  Anesthesia Type:General  Level of Consciousness: drowsy  Airway & Oxygen Therapy: Patient Spontanous Breathing  Post-op Assessment: Report given to RN and Post -op Vital signs reviewed and stable  Post vital signs: Reviewed and stable  Last Vitals:  Vitals Value Taken Time  BP 98/55 06/26/19 0919  Temp 36.2 C 06/26/19 0919  Pulse 67 06/26/19 0923  Resp 17 06/26/19 0923  SpO2 98 % 06/26/19 0923  Vitals shown include unvalidated device data.  Last Pain:  Vitals:   06/26/19 0919  TempSrc: Temporal  PainSc: Asleep         Complications: No apparent anesthesia complications

## 2019-06-26 NOTE — Anesthesia Postprocedure Evaluation (Signed)
Anesthesia Post Note  Patient: Wendy Kelly  Procedure(s) Performed: COLONOSCOPY WITH PROPOFOL (N/A )  Patient location during evaluation: Endoscopy Anesthesia Type: General Level of consciousness: awake and alert and oriented Pain management: pain level controlled Vital Signs Assessment: post-procedure vital signs reviewed and stable Respiratory status: spontaneous breathing Cardiovascular status: blood pressure returned to baseline Anesthetic complications: no     Last Vitals:  Vitals:   06/26/19 0929 06/26/19 0939  BP: (!) 104/45 106/71  Pulse: 68 (!) 58  Resp: 14 16  Temp:    SpO2: 98% 100%    Last Pain:  Vitals:   06/26/19 0939  TempSrc:   PainSc: 0-No pain                 Clenton Esper

## 2019-06-26 NOTE — Op Note (Addendum)
[facility] Gastroenterology Patient Name: Wendy Kelly Procedure Date: 06/26/2019 8:50 AM MRN: [id number] Account #: [id number] Date of Birth: Feb 09, 1956 Admit Type: Outpatient Age: 63 Room: [facility] ENDO ROOM 4 Gender: Female Note Status: Finalized Procedure:             Colonoscopy Indications:           Screening for colorectal malignant neoplasm Providers:             Lucilla Lame MD, MD Referring MD:          Valerie Roys (Referring MD) Medicines:             Propofol per Anesthesia Complications:         No immediate complications. Procedure:             Pre-Anesthesia Assessment:                        - Prior to the procedure, a History and Physical was                         performed, and patient medications and allergies were                         reviewed. The patient's tolerance of previous                         anesthesia was also reviewed. The risks and benefits                         of the procedure and the sedation options and risks                         were discussed with the patient. All questions were                         answered, and informed consent was obtained. Prior                         Anticoagulants: The patient has taken no previous                         anticoagulant or antiplatelet agents. ASA Grade                         Assessment: II - A patient with mild systemic disease.                         After reviewing the risks and benefits, the patient                         was deemed in satisfactory condition to undergo the                         procedure.                        After obtaining informed consent, the colonoscope was  passed under direct vision. Throughout the procedure,                         the patient's blood pressure, pulse, and oxygen                         saturations were monitored continuously. The                         Colonoscope was introduced through the  anus and                         advanced to the the cecum, identified by appendiceal                         orifice and ileocecal valve. The colonoscopy was                         performed without difficulty. The patient tolerated                         the procedure well. The quality of the bowel                         preparation was good. Findings:      The perianal and digital rectal examinations were normal.      A 4 mm polyp was found in the ascending colon. The polyp was sessile.       Resection was complete, but the polyp tissue was not retrieved.      Two sessile polyps were found in the descending colon. The polyps were 3       to 4 mm in size. These polyps were removed with a cold snare. Resection       and retrieval were complete.      Multiple small-mouthed diverticula were found in the sigmoid colon.       There was no evidence of diverticular bleeding.      Non-bleeding internal hemorrhoids were found during retroflexion. The       hemorrhoids were Grade I (internal hemorrhoids that do not prolapse). Impression:            - One 4 mm polyp in the ascending colon. Complete                         resection. Polyp tissue not retrieved.                        - Two 3 to 4 mm polyps in the descending colon,                         removed with a cold snare. Resected and retrieved.                        - Diverticulosis in the sigmoid colon. There was no                         evidence of diverticular bleeding.                        -  Non-bleeding internal hemorrhoids. Recommendation:        - Discharge patient to home.                        - Resume previous diet.                        - Continue present medications.                        - Await pathology results.                        - Repeat colonoscopy in 5 years if polyp adenoma and                         10 years if hyperplastic Procedure Code(s):     --- Professional ---                        [phone],  Colonoscopy, flexible; with removal of                         tumor(s), polyp(s), or other lesion(s) by snare                         technique Diagnosis Code(s):     --- Professional ---                        Z12.11, Encounter for screening for malignant neoplasm                         of colon                        K63.5, Polyp of colon CPT copyright 2019 American Medical Association. All rights reserved. The codes documented in this report are preliminary and upon coder review may  be revised to meet current compliance requirements. Lucilla Lame MD, MD 06/26/2019 9:16:44 AM This report has been signed electronically. Number of Addenda: 0 Note Initiated On: 06/26/2019 8:50 AM Scope Withdrawal Time: 0 hours 13 minutes 22 seconds  Total Procedure Duration: 0 hours 17 minutes 12 seconds  Estimated Blood Loss:  Estimated blood loss: none.      [facility]

## 2019-06-26 NOTE — Anesthesia Post-op Follow-up Note (Signed)
Anesthesia QCDR form completed.        

## 2019-06-26 NOTE — H&P (Signed)
Lucilla Lame, MD [address] [place], [place] [address] Phone: [phone] Fax : [phone]  Primary Care Physician:  Valerie Roys, DO Primary Gastroenterologist:  Dr. Allen Norris  Pre-Procedure History & Physical: HPI:  Wendy Kelly is a 63 y.o. female is here for a screening colonoscopy.   Past Medical History:  Diagnosis Date  . AA (aortic aneurysm) ([place])    a. 04/2011 Echo: Ao Root: 4.1cm, Asc Ao 4.7cm.  . Anxiety   . AV block, 2nd degree    a. 05/2005 - s/p MDT Adapta ADDR01 Dual Chamber PPM  . Bicuspid aortic valve   . Cancer ([place])    Throat  . Chest pain    a. 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  Marland Kitchen Deep vein thrombosis (DVT) of left lower extremity ([place]) 10/03/2017  . Dehydration 08/11/2017  . Depression   . Expressive aphasia    a. ongoing since 04/2011 - seen by neurology - ? TIA vs. Migraine  . Facial numbness    a. ongoing since 04/2011 - seen by neurology - ? TIA vs. Migraine  . GERD (gastroesophageal reflux disease)   . Low blood pressure   . Personal history of chemotherapy 2018-2019   Throat  . Personal history of radiation therapy 2018-2019   Throat  . Presence of permanent cardiac pacemaker   . Shingles    pt had on right side of head and his neck  . 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 Syncope    a. 04/2011 Echo: EF 55-65%, No RWMA, Gr 1 DD.  Marland Kitchen Thyroid disease     Past Surgical History:  Procedure Laterality Date  . ABDOMINAL WALL MESH  REMOVAL    . bladder tack    . cataract Bilateral 2004  . CHOLECYSTECTOMY  2000  . DILATION AND CURETTAGE OF UTERUS    . DIRECT LARYNGOSCOPY Right 06/16/2017   Procedure: MICRO DIRECT LARYNGOSCOPY WITH BIOPSY OF RIGHT BASE OF TONGUE;  Surgeon: Carloyn Manner, MD;  Location: [facility] ORS;  Service: ENT;  Laterality: Right;  . EP IMPLANTABLE DEVICE N/A 06/04/2015   Procedure: PPM Generator Changeout;  Surgeon: Deboraha Sprang, MD;  Location: [place] CV LAB;  Service:  Cardiovascular;  Laterality: N/A;  . ESOPHAGOGASTRODUODENOSCOPY (EGD) WITH PROPOFOL N/A 12/18/2015   Procedure: ESOPHAGOGASTRODUODENOSCOPY (EGD) WITH gastric biopsy and dilation;  Surgeon: Lucilla Lame, MD;  Location: [place];  Service: Endoscopy;  Laterality: N/A;  . EYE SURGERY    . INSERT / REPLACE / REMOVE PACEMAKER    . IR FLUORO GUIDE PORT INSERTION RIGHT  06/22/2017  . IR REMOVAL TUN ACCESS W/ PORT W/O FL MOD SED  04/14/2018  . MOHS SURGERY  02/2018  . PACEMAKER INSERTION  2006   [employer] Adapta ADDR01  . TONSILLECTOMY    . TONSILLECTOMY    . VAGINAL HYSTERECTOMY  2013   mad/cope    Prior to Admission medications   Medication Sig Start Date End Date Taking? Authorizing Provider  aspirin 81 MG tablet Take 81 mg by mouth daily.   Yes [provider]  b complex vitamins tablet Take 2 tablets by mouth daily.    Yes [provider]  BLACK ELDERBERRY,BERRY-FLOWER, PO Take 2 capsules by mouth daily.    Yes [provider]  Calcium Carb-Cholecalciferol (CALCIUM 500 + D3 PO) Take 2 Doses by mouth daily.    Yes [provider]  CRANBERRY SOFT PO Take 2 each by mouth daily.  Yes [provider]  Digestive Enzymes (DIGESTIVE ENZYME PO) Take 1-2 capsules by mouth daily.   Yes [provider]  fexofenadine (ALLEGRA) 60 MG tablet Take 60 mg by mouth at bedtime.   Yes [provider]  levothyroxine (SYNTHROID) 75 MCG tablet Take 1 tablet (75 mcg total) by mouth daily. BRAND NAME SYNTHROID NECESSARY 04/27/19  Yes Johnson, Megan P, DO  Loratadine (CLARITIN PO) Take by mouth daily.   Yes [provider]  PRESCRIPTION MEDICATION Place 1 tablet vaginally 3 (three) times a week. E3 1mg  and Testosterone 1mg  compound   Yes [provider]    Allergies as of 04/26/2019 - Review Complete 04/26/2019  Allergen Reaction Noted  . Cefdinir Swelling   . Cephalosporins Swelling 08/18/2015  . Dairy aid [lactase]  Swelling 06/13/2017  . Flagyl [metronidazole] Swelling 05/07/2014  . Lactose intolerance (gi) Other (See Comments) 07/01/2014  . Penicillins Rash   . Phenylephrine-guaifenesin Anaphylaxis 04/25/2012  . Sulfa antibiotics Rash and Hives 11/16/2013  . Sulfonamide derivatives Rash   . Tape Rash 05/07/2014  . Dexilant [dexlansoprazole]  01/06/2016  . Entex lq [phenylephrine-guaifenesin]  04/25/2012  . Other Other (See Comments) 08/18/2015  . Oxycodone Nausea And Vomiting 04/25/2012  . Succinylcholine Other (See Comments) 06/30/2014  . Vancomycin Rash 04/14/2018  . Tetracycline Other (See Comments) 06/30/2014  . Codeine Nausea And Vomiting 04/25/2012  . Guaifenesin & derivatives Other (See Comments) 04/25/2012  . Lactobacillus Itching 05/21/2014  . Naproxen sodium    . Pseudoephedrine Rash 08/18/2015  . Tetracyclines & related Other (See Comments) 06/30/2014  . Wheat bran Other (See Comments) 12/12/2015    Family History  Problem Relation Age of Onset  . COPD Mother        alive @ 2  . Atrial fibrillation Mother   . Stroke Father        died @ 49  . Breast cancer Maternal Aunt 70  . COPD Maternal Grandmother   . Ovarian cancer Neg Hx   . Diabetes Neg Hx     Social History   Socioeconomic History  . Marital status: Married    Spouse name: Not on file  . Number of children: Not on file  . Years of education: Not on file  . Highest education level: Not on file  Occupational History  . Not on file  Tobacco Use  . Smoking status: Never Smoker  . Smokeless tobacco: Never Used  . Tobacco comment: tobacco use -no  Substance and Sexual Activity  . Alcohol use: No  . Drug use: No  . Sexual activity: Yes    Birth control/protection: Surgical  Other Topics Concern  . Not on file  Social History Narrative   Lives in [place] with husband.  Works out regularly.  Works as a Geophysicist/field seismologist - owns own business.    Social Determinants of Health   Financial Resource  Strain:   . Difficulty of Paying Living Expenses: Not on file  Food Insecurity:   . Worried About Charity fundraiser in the Last Year: Not on file  . Ran Out of Food in the Last Year: Not on file  Transportation Needs:   . Lack of Transportation (Medical): Not on file  . Lack of Transportation (Non-Medical): Not on file  Physical Activity:   . Days of Exercise per Week: Not on file  . Minutes of Exercise per Session: Not on file  Stress:   . Feeling of Stress : Not on file  Social Connections:   . Frequency of Communication with Friends and Family: Not on file  . Frequency of Social Gatherings with Friends and Family: Not on file  . Attends Religious Services: Not on file  . Active Member of Clubs or Organizations: Not on file  . Attends Archivist Meetings: Not on file  . Marital Status: Not on file  Intimate Partner Violence:   . Fear of Current or Ex-Partner: Not on file  . Emotionally Abused: Not on file  . Physically Abused: Not on file  . Sexually Abused: Not on file    Review of Systems: See HPI, otherwise negative ROS  Physical Exam: BP (!) 90/51   Pulse 78   Temp 97.8 F (36.6 C)   Resp 16   Ht 5\' 3"  (1.6 m)   SpO2 100%   BMI 25.11 kg/m  General:   Alert,  pleasant and cooperative in NAD Head:  Normocephalic and atraumatic. Neck:  Supple; no masses or thyromegaly. Lungs:  Clear throughout to auscultation.    Heart:  Regular rate and rhythm. Abdomen:  Soft, nontender and nondistended. Normal bowel sounds, without guarding, and without rebound.   Neurologic:  Alert and  oriented x4;  grossly normal neurologically.  Impression/Plan: Wendy Kelly is now here to undergo a screening colonoscopy.  Risks, benefits, and alternatives regarding colonoscopy have been reviewed with the patient.  Questions have been answered.  All parties agreeable.

## 2019-06-27 ENCOUNTER — Encounter: Payer: Self-pay | Admitting: *Deleted

## 2019-06-27 ENCOUNTER — Encounter: Payer: Self-pay | Admitting: Gastroenterology

## 2019-06-27 LAB — SURGICAL PATHOLOGY

## 2019-06-28 NOTE — Telephone Encounter (Signed)
These are new recommendations  It is a discussion she can best have with Dr PVT Holli Humbles

## 2019-07-07 NOTE — Progress Notes (Signed)
PPM remote 

## 2019-07-23 ENCOUNTER — Ambulatory Visit
Admission: RE | Admit: 2019-07-23 | Discharge: 2019-07-23 | Disposition: A | Discharge status: Home / Self Care | Payer: Managed Care, Other (non HMO) | Source: Ambulatory Visit | Attending: Radiation Oncology | Admitting: Radiation Oncology

## 2019-07-23 ENCOUNTER — Encounter: Payer: Self-pay | Admitting: Radiation Oncology

## 2019-07-23 ENCOUNTER — Other Ambulatory Visit: Payer: Self-pay

## 2019-07-23 DIAGNOSIS — Z85810 Personal history of malignant neoplasm of tongue: Secondary | ICD-10-CM | POA: Diagnosis not present

## 2019-07-23 DIAGNOSIS — Z923 Personal history of irradiation: Secondary | ICD-10-CM | POA: Diagnosis not present

## 2019-07-23 DIAGNOSIS — I712 Thoracic aortic aneurysm, without rupture: Secondary | ICD-10-CM | POA: Diagnosis not present

## 2019-07-23 DIAGNOSIS — C760 Malignant neoplasm of head, face and neck: Secondary | ICD-10-CM

## 2019-07-23 NOTE — Progress Notes (Signed)
Radiation Oncology Follow up Note  Name: Wendy Kelly   Date:   07/23/2019 MRN:  [id number] DOB: April 25, 1956    This 64 y.o. 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  Seen today in routine follow-up she is doing well.  She specifically denies head and neck pain dysphagia.  She is scoped every 4 months by ENT with most recent exam showing no evidence of disease..  She has a ascending thoracic aortic aneurysm which is being followed by CT scans last one was in July showing a maximum diameter of 4.8 cm with semiannual imaging recommended.  Her last PET CT scan was back in May 2019 showed no evidence of hypermetabolic activity.  COMPLICATIONS OF TREATMENT: none  FOLLOW UP COMPLIANCE: keeps appointments   PHYSICAL EXAM:  BP (P) 125/78 (BP Location: Left Arm, Patient Position: Sitting)   Pulse (P) 70   Resp (P) 16   Wt (P) 140 lb 1.6 oz (63.5 kg)   BMI (P) 24.82 kg/m  No evidence of cervical or supraclavicular adenopathy.  Well-developed well-nourished patient in NAD. HEENT reveals PERLA, EOMI, discs not visualized.  Oral cavity is clear. No oral mucosal lesions are identified. Neck is clear without evidence of cervical or supraclavicular adenopathy. Lungs are clear to A&P. Cardiac examination is essentially unremarkable with regular rate and rhythm without murmur rub or thrill. Abdomen is benign with no organomegaly or masses noted. Motor sensory and DTR levels are equal and symmetric in the upper and lower extremities. Cranial nerves II through XII are grossly intact. Proprioception is intact. No peripheral adenopathy or edema is identified. No motor or sensory levels are  noted. Crude visual fields are within normal range.  RADIOLOGY RESULTS: CT scan of chest and prior PET CT scan reviewed  PLAN: This time patient is now over 2 years with no evidence of disease.  I feel ENT can continue following her on 23-month basis with upper endoscopy.  I would be happy to reevaluate the patient anytime in the future should radiation oncology consultation be indicated.  Patient knows to call with any concerns.  I would like to take this opportunity to thank you for allowing me to participate in the care of your patient.Noreene Filbert, MD

## 2019-08-19 ENCOUNTER — Encounter: Payer: Self-pay | Admitting: Family Medicine

## 2019-08-19 DIAGNOSIS — Z923 Personal history of irradiation: Secondary | ICD-10-CM

## 2019-08-27 ENCOUNTER — Other Ambulatory Visit: Payer: Self-pay

## 2019-08-27 ENCOUNTER — Ambulatory Visit
Admission: RE | Admit: 2019-08-27 | Discharge: 2019-08-27 | Disposition: A | Discharge status: Home / Self Care | Payer: Managed Care, Other (non HMO) | Source: Ambulatory Visit | Attending: Family Medicine | Admitting: Family Medicine

## 2019-08-27 ENCOUNTER — Ambulatory Visit
Admission: RE | Admit: 2019-08-27 | Discharge: 2019-08-27 | Disposition: A | Discharge status: Home / Self Care | Payer: Managed Care, Other (non HMO) | Attending: Family Medicine | Admitting: Family Medicine

## 2019-08-27 DIAGNOSIS — Z923 Personal history of irradiation: Secondary | ICD-10-CM | POA: Insufficient documentation

## 2019-08-27 DIAGNOSIS — C140 Malignant neoplasm of pharynx, unspecified: Secondary | ICD-10-CM | POA: Diagnosis not present

## 2019-08-27 DIAGNOSIS — C329 Malignant neoplasm of larynx, unspecified: Secondary | ICD-10-CM | POA: Diagnosis not present

## 2019-09-10 DIAGNOSIS — U071 COVID-19: Secondary | ICD-10-CM | POA: Diagnosis not present

## 2019-09-10 DIAGNOSIS — Z20828 Contact with and (suspected) exposure to other viral communicable diseases: Secondary | ICD-10-CM | POA: Diagnosis not present

## 2019-09-11 ENCOUNTER — Telehealth (HOSPITAL_COMMUNITY): Payer: Self-pay | Admitting: Nurse Practitioner

## 2019-09-11 ENCOUNTER — Other Ambulatory Visit: Payer: Self-pay

## 2019-09-11 ENCOUNTER — Encounter: Payer: Self-pay | Admitting: Nurse Practitioner

## 2019-09-11 ENCOUNTER — Ambulatory Visit (INDEPENDENT_AMBULATORY_CARE_PROVIDER_SITE_OTHER): Payer: 59 | Admitting: *Deleted

## 2019-09-11 ENCOUNTER — Encounter: Payer: Self-pay | Admitting: Family Medicine

## 2019-09-11 ENCOUNTER — Telehealth (INDEPENDENT_AMBULATORY_CARE_PROVIDER_SITE_OTHER): Payer: 59 | Admitting: Family Medicine

## 2019-09-11 ENCOUNTER — Other Ambulatory Visit (HOSPITAL_COMMUNITY): Payer: Self-pay | Admitting: Nurse Practitioner

## 2019-09-11 VITALS — BP 114/76 | HR 78 | Temp 98.7°F | Ht 63.0 in | Wt 140.0 lb

## 2019-09-11 DIAGNOSIS — U071 COVID-19: Secondary | ICD-10-CM | POA: Diagnosis not present

## 2019-09-11 DIAGNOSIS — I441 Atrioventricular block, second degree: Secondary | ICD-10-CM | POA: Diagnosis not present

## 2019-09-11 HISTORY — DX: COVID-19: U07.1

## 2019-09-11 NOTE — Progress Notes (Signed)
BP 114/76   Pulse 78   Temp 98.7 F (37.1 C) (Oral)   Ht 5\' 3"  (1.6 m)   Wt 140 lb (63.5 kg)   SpO2 99%   BMI 24.80 kg/m    Subjective:    Patient ID: Wendy Kelly, female    DOB: [birth year], 64 y.o.   MRN: [id number]  HPI: Wendy Kelly is a 64 y.o. female  Chief Complaint  Patient presents with  . Fatigue    sypmtoms started last Sunday. got a positive covid 19 test yesterday  . Fever    lower grade fever, headaches  . Diarrhea  . Generalized Body Aches    yesterday   UPPER RESPIRATORY TRACT INFECTION Duration: Sunday PM started feeling sick- diagnosed yesterday Worst symptom: headache and sinus pressure, diarrhea Fever: yes Cough: no Shortness of breath: no Wheezing: no Chest pain: no Chest tightness: no Chest congestion: no Nasal congestion: yes Runny nose: yes Post nasal drip: yes Sneezing: yes Sore throat: no Swollen glands: yes Sinus pressure: yes Headache: yes Face pain: no Toothache: no Ear pain: no  Ear pressure: yes  Eyes red/itching:no Eye drainage/crusting: no  Vomiting: no Rash: no Fatigue: yes Sick contacts: yes Strep contacts: no  Context: stable Recurrent sinusitis: no Relief with OTC cold/cough medications: no  Treatments attempted: none  Relevant past medical, surgical, family and social history reviewed and updated as indicated. Interim medical history since our last visit reviewed. Allergies and medications reviewed and updated.  Review of Systems  Constitutional: Positive for fatigue and fever. Negative for activity change, appetite change, chills, diaphoresis and unexpected weight change.  HENT: Positive for congestion, postnasal drip, rhinorrhea, sinus pressure and sneezing. Negative for dental problem, drooling, ear discharge, ear pain, facial swelling, hearing loss, mouth sores, nosebleeds, sinus pain, sore throat, tinnitus, trouble swallowing and voice change.   Eyes: Negative.   Respiratory: Negative for apnea, cough,  choking, chest tightness, shortness of breath, wheezing and stridor.   Cardiovascular: Negative.   Gastrointestinal: Positive for diarrhea. Negative for abdominal distention, abdominal pain, anal bleeding, blood in stool, constipation, nausea, rectal pain and vomiting.  Musculoskeletal: Positive for myalgias. Negative for arthralgias, back pain, gait problem, joint swelling, neck pain and neck stiffness.  Skin: Negative.   Psychiatric/Behavioral: Negative.     Per HPI unless specifically indicated above     Objective:    BP 114/76   Pulse 78   Temp 98.7 F (37.1 C) (Oral)   Ht 5\' 3"  (1.6 m)   Wt 140 lb (63.5 kg)   SpO2 99%   BMI 24.80 kg/m   Wt Readings from Last 3 Encounters:  09/11/19 140 lb (63.5 kg)  07/23/19 (P) 140 lb 1.6 oz (63.5 kg)  06/19/19 141 lb 12 oz (64.3 kg)    Physical Exam Vitals and nursing note reviewed.  Constitutional:      General: She is not in acute distress.    Appearance: Normal appearance. She is not ill-appearing, toxic-appearing or diaphoretic.  HENT:     Head: Normocephalic and atraumatic.     Right Ear: External ear normal.     Left Ear: External ear normal.     Nose: Nose normal.     Mouth/Throat:     Mouth: Mucous membranes are moist.     Pharynx: Oropharynx is clear.  Eyes:     General: No scleral icterus.       Right eye: No discharge.        Left eye: No discharge.  Conjunctiva/sclera: Conjunctivae normal.     Pupils: Pupils are equal, round, and reactive to light.  Pulmonary:     Effort: Pulmonary effort is normal. No respiratory distress.     Comments: Speaking in full sentences Musculoskeletal:        General: Normal range of motion.     Cervical back: Normal range of motion.  Skin:    Coloration: Skin is not jaundiced or pale.     Findings: No bruising, erythema, lesion or rash.  Neurological:     Mental Status: She is alert and oriented to person, place, and time. Mental status is at baseline.  Psychiatric:         Mood and Affect: Mood normal.        Behavior: Behavior normal.        Thought Content: Thought content normal.        Judgment: Judgment normal.     Results for orders placed or performed during the hospital encounter of 06/26/19  Surgical pathology  Result Value Ref Range   SURGICAL PATHOLOGY      SURGICAL PATHOLOGY CASE: [id number] PATIENT: Wendy Kelly Surgical Pathology Report     Specimen Submitted: A. Colon polyps x2, descending; cold snare  Clinical History: Screening colonoscopy. Colon polyps, diverticulosis.      DIAGNOSIS: A.  COLON POLYPS X2, DESCENDING; COLD SNARE: - TUBULAR ADENOMA; NEGATIVE FOR HIGH-GRADE DYSPLASIA AND MALIGNANCY (1). - TANGENTIALLY SECTIONED SERRATED POLYP, FAVOR EARLY SESSILE SERRATED POLYP; NEGATIVE FOR DYSPLASIA AND MALIGNANCY (1).  Comment: The criteria for serrated polyposis syndrome (SPS) have been recently revised. Any histologic subtype of serrated polyp (hyperplastic polyp, sessile serrated polyp with or without high-grade dysplasia, traditional serrated adenoma, and serrated polyp NOS) is included in the final polyp count. The polyp count is cumulative over multiple colonoscopies.  Clinical criteria for the diagnosis of serrated polyposis syndrome (SPS) includes: Criterion 1: At least 5 serrated polyps  proximal to rectum, all at least 5 mm in size, with 2 or more measuring at least 10 mm; or  Criterion 2: More than 20 serrated polyps of any size distributed throughout the large bowel, with at least 5 located proximal to the rectum.  References: 2019 WHO Classification of Digestive System Tumors, 5th Edition.  GROSS DESCRIPTION: A. Labeled: Descending colon polyp cold snare Received: In formalin Tissue fragment(s): 2 Size: 0.1 and 0.3 cm Description: Tan soft tissue fragments Entirely submitted in 1 cassette.   Final Diagnosis performed by Quay Burow, MD.   Electronically signed 06/27/2019 8:49:25AM The  electronic signature indicates that the named Attending Pathologist has evaluated the specimen Technical component performed at [facility], [address], [place], [place] [address] Lab: [phone] Dir: Rush Farmer, MD, MMM  Professional component performed at [facility], [facility], [place], [place], [place] [address] Lab: [id number] Nebo Reuel Derby, MD       Assessment & Plan:   Problem List Items Addressed This Visit    None    Visit Diagnoses    COVID-19    -  Primary   Symptomatic care as needed. Self-quarantine until 09/24/19. We will see about infusions. Call with any concerns.        Follow up plan: Return in about 2 weeks (around 09/25/2019) for virtual.   . This visit was completed via MyChart due to the restrictions of the COVID-19 pandemic. All issues as above were discussed and addressed. Physical exam was done as above through visual confirmation on MyChart. If it  was felt that the patient should be evaluated in the office, they were directed there. The patient verbally consented to this visit. . Location of the patient: home . Location of the provider: work . Those involved with this call:  . Provider: Park Liter, DO . CMA: Lesle Chris, [place] . Front Desk/Registration: Don Perking  . Time spent on call: 15 minutes with patient face to face via video conference. More than 50% of this time was spent in counseling and coordination of care. 23 minutes total spent in review of patient's record and preparation of their chart.

## 2019-09-11 NOTE — Telephone Encounter (Signed)
Called to Discuss with patient about Covid symptoms and the use of bamlanivimab, a monoclonal antibody infusion for those with mild to moderate Covid symptoms and at a high risk of hospitalization.     Pt is qualified for this infusion at the [facility] infusion center due to co-morbid conditions and/or a member of an at-risk group.     Patient was tested at [facility] and tested positive. Symptoms started 09/09/19. See orders encounter for further details.   [name], [place], AGNP-C [phone] ([place])

## 2019-09-11 NOTE — Progress Notes (Signed)
I connected by phone with Wendy Kelly on 09/11/2019 at 12:46 PM to discuss the potential use of an new treatment for mild to moderate COVID-19 viral infection in non-hospitalized patients.  This patient is a 64 y.o. female that meets the FDA criteria for Emergency Use Authorization of bamlanivimab or casirivimab\imdevimab.  Has a (+) direct SARS-CoV-2 viral test result  Has mild or moderate COVID-19   Is ? 65 years of age and weighs ? 40 kg  Is NOT hospitalized due to COVID-19  Is NOT requiring oxygen therapy or requiring an increase in baseline oxygen flow rate due to COVID-19  Is within 10 days of symptom onset  Has at least one of the high risk factor(s) for progression to severe COVID-19 and/or hospitalization as defined in EUA.  Specific high risk criteria : Cardiovascular Disease  I have spoken and communicated the following to the patient or parent/caregiver:  1. FDA has authorized the emergency use of bamlanivimab and casirivimab\imdevimab for the treatment of mild to moderate COVID-19 in adults and pediatric patients with positive results of direct SARS-CoV-2 viral testing who are 33 years of age and older weighing at least 40 kg, and who are at high risk for progressing to severe COVID-19 and/or hospitalization.  2. The significant known and potential risks and benefits of bamlanivimab and casirivimab\imdevimab, and the extent to which such potential risks and benefits are unknown.  3. Information on available alternative treatments and the risks and benefits of those alternatives, including clinical trials.  4. Patients treated with bamlanivimab and casirivimab\imdevimab should continue to self-isolate and use infection control measures (e.g., wear mask, isolate, social distance, avoid sharing personal items, clean and disinfect "high touch" surfaces, and frequent handwashing) according to CDC guidelines.   5. The patient or parent/caregiver has the option to accept or refuse  bamlanivimab or casirivimab\imdevimab .  After reviewing this information with the patient, The patient agreed to proceed with receiving the bamlanimivab infusion and will be provided a copy of the Fact sheet prior to receiving the infusion.[name], [place], AGNP-C [phone] ([place])

## 2019-09-12 ENCOUNTER — Ambulatory Visit (HOSPITAL_COMMUNITY)
Admission: RE | Admit: 2019-09-12 | Discharge: 2019-09-12 | Disposition: A | Discharge status: Home / Self Care | Payer: 59 | Source: Ambulatory Visit | Attending: Pulmonary Disease | Admitting: Pulmonary Disease

## 2019-09-12 DIAGNOSIS — U071 COVID-19: Secondary | ICD-10-CM

## 2019-09-12 LAB — CUP PACEART REMOTE DEVICE CHECK
Battery Impedance: 187 Ohm
Battery Remaining Longevity: 138 mo
Battery Voltage: 2.79 V
Brady Statistic AP VP Percent: 0 %
Brady Statistic AP VS Percent: 15 %
Brady Statistic AS VP Percent: 0 %
Brady Statistic AS VS Percent: 85 %
Date Time Interrogation Session: 20210303063316
Implantable Lead Implant Date: 20061128
Implantable Lead Implant Date: 20061128
Implantable Lead Location: 753859
Implantable Lead Location: 753860
Implantable Lead Model: 4092
Implantable Lead Model: 4592
Implantable Pulse Generator Implant Date: 20161123
Lead Channel Impedance Value: 593 Ohm
Lead Channel Impedance Value: 722 Ohm
Lead Channel Pacing Threshold Amplitude: 0.375 V
Lead Channel Pacing Threshold Amplitude: 1 V
Lead Channel Pacing Threshold Pulse Width: 0.4 ms
Lead Channel Pacing Threshold Pulse Width: 0.4 ms
Lead Channel Setting Pacing Amplitude: 2 V
Lead Channel Setting Pacing Amplitude: 2.5 V
Lead Channel Setting Pacing Pulse Width: 0.4 ms
Lead Channel Setting Sensing Sensitivity: 4 mV

## 2019-09-12 MED ORDER — FAMOTIDINE IN NACL 20-0.9 MG/50ML-% IV SOLN: 20.0000 mg | Freq: Once | INTRAVENOUS | Status: DC | PRN

## 2019-09-12 MED ORDER — DIPHENHYDRAMINE HCL 50 MG/ML IJ SOLN: 50.0000 mg | Freq: Once | INTRAMUSCULAR | Status: DC | PRN

## 2019-09-12 MED ORDER — ALBUTEROL SULFATE HFA 108 (90 BASE) MCG/ACT IN AERS: 2.0000 | INHALATION_SPRAY | Freq: Once | RESPIRATORY_TRACT | Status: DC | PRN

## 2019-09-12 MED ORDER — EPINEPHRINE 0.3 MG/0.3ML IJ SOAJ: 0.3000 mg | Freq: Once | INTRAMUSCULAR | Status: DC | PRN

## 2019-09-12 MED ORDER — SODIUM CHLORIDE 0.9 % IV SOLN: INTRAVENOUS | Status: DC | PRN

## 2019-09-12 MED ORDER — METHYLPREDNISOLONE SODIUM SUCC 125 MG IJ SOLR: 125.0000 mg | Freq: Once | INTRAMUSCULAR | Status: DC | PRN

## 2019-09-12 MED ORDER — SODIUM CHLORIDE 0.9 % IV SOLN
700.0000 mg | Freq: Once | INTRAVENOUS | Status: AC
  Administered 2019-09-12: 700 mg via INTRAVENOUS
  Filled 2019-09-12: qty 20

## 2019-09-12 NOTE — Progress Notes (Signed)
  Diagnosis: COVID-19  Physician: Dr. Wright  Procedure: Covid Infusion Clinic Med: bamlanivimab infusion - Provided patient with bamlanimivab fact sheet for patients, parents and caregivers prior to infusion.  Complications: No immediate complications noted.  Discharge: Discharged home   Kaitlin Alcindor A 09/12/2019  

## 2019-09-12 NOTE — Discharge Instructions (Signed)

## 2019-09-13 ENCOUNTER — Telehealth: Payer: 59

## 2019-09-26 ENCOUNTER — Telehealth (INDEPENDENT_AMBULATORY_CARE_PROVIDER_SITE_OTHER): Payer: 59 | Admitting: Family Medicine

## 2019-09-26 ENCOUNTER — Encounter: Payer: Self-pay | Admitting: Family Medicine

## 2019-09-26 DIAGNOSIS — U071 COVID-19: Secondary | ICD-10-CM

## 2019-09-26 NOTE — Progress Notes (Signed)
There were no vitals taken for this visit.   Subjective:    Patient ID: Wendy Kelly, female    DOB: 05-09-1956, 64 y.o.   MRN: [id number]  HPI: Wendy Kelly is a 64 y.o. female  Chief Complaint  Patient presents with  . covid follow up    Covid antibody infusion given   Feeling better. Feels like the antibody infusion helped. No SOB, No trouble breathing. She had some palpitations initially, but it has since resolved. She denies any significant fatigue. She is otherwise doing well with no other concerns or complaints at this time.   Relevant past medical, surgical, family and social history reviewed and updated as indicated. Interim medical history since our last visit reviewed. Allergies and medications reviewed and updated.  Review of Systems  Constitutional: Negative.   HENT: Negative.   Respiratory: Negative.   Cardiovascular: Negative.   Gastrointestinal: Negative.   Musculoskeletal: Negative.   Neurological: Negative.   Psychiatric/Behavioral: Negative.     Per HPI unless specifically indicated above     Objective:    There were no vitals taken for this visit.  Wt Readings from Last 3 Encounters:  09/11/19 140 lb (63.5 kg)  07/23/19 (P) 140 lb 1.6 oz (63.5 kg)  06/19/19 141 lb 12 oz (64.3 kg)    Physical Exam Vitals and nursing note reviewed.  Constitutional:      General: She is not in acute distress.    Appearance: Normal appearance. She is not ill-appearing, toxic-appearing or diaphoretic.  HENT:     Head: Normocephalic and atraumatic.     Right Ear: External ear normal.     Left Ear: External ear normal.     Nose: Nose normal.     Mouth/Throat:     Mouth: Mucous membranes are moist.     Pharynx: Oropharynx is clear.  Eyes:     General: No scleral icterus.       Right eye: No discharge.        Left eye: No discharge.     Conjunctiva/sclera: Conjunctivae normal.     Pupils: Pupils are equal, round, and reactive to light.  Pulmonary:     Effort:  Pulmonary effort is normal. No respiratory distress.     Comments: Speaking in full sentences Musculoskeletal:        General: Normal range of motion.     Cervical back: Normal range of motion.  Skin:    Coloration: Skin is not jaundiced or pale.     Findings: No bruising, erythema, lesion or rash.  Neurological:     Mental Status: She is alert and oriented to person, place, and time. Mental status is at baseline.  Psychiatric:        Mood and Affect: Mood normal.        Behavior: Behavior normal.        Thought Content: Thought content normal.        Judgment: Judgment normal.     Results for orders placed or performed in visit on 09/11/19  CUP PACEART REMOTE DEVICE CHECK  Result Value Ref Range   Date Time Interrogation Session [id number]    Pulse Generator Manufacturer MERM    Pulse Gen Model ADDRL1 Adapta    Pulse Gen Serial Number [id number]    Clinic Name [place] Pulse Generator Type Implantable Pulse Generator    Implantable Pulse Generator Implant Date [id number]    Implantable Lead Manufacturer MERM    Implantable Lead  Model 4092 CapSure SP Novus    Implantable Lead Serial Number [id number]    Implantable Lead Implant Date [id number]    Implantable Lead Location [phone]    Implantable Lead Manufacturer MERM    Implantable Lead Model 4592 CapSure SP Novus    Implantable Lead Serial Number [id number]    Implantable Lead Implant Date [id number]    Implantable Lead Location [phone]    Lead Channel Setting Sensing Sensitivity 4.00 mV   Lead Channel Setting Pacing Amplitude 2.000 V   Lead Channel Setting Pacing Pulse Width 0.40 ms   Lead Channel Setting Pacing Amplitude 2.500 V   Lead Channel Impedance Value 593 ohm   Lead Channel Pacing Threshold Amplitude 0.375 V   Lead Channel Pacing Threshold Pulse Width 0.40 ms   Lead Channel Impedance Value 722 ohm   Lead Channel Pacing Threshold Amplitude 1.000 V   Lead Channel Pacing Threshold Pulse Width  0.40 ms   Battery Status OK    Battery Remaining Longevity 138 mo   Battery Voltage 2.79 V   Battery Impedance 187 ohm   Brady Statistic AP VP Percent 0 %   Brady Statistic AS VP Percent 0 %   Brady Statistic AP VS Percent 15 %   Brady Statistic AS VS Percent 85 %      Assessment & Plan:   Problem List Items Addressed This Visit    None    Visit Diagnoses    COVID-19    -  Primary   Resolved. Recovering well. No other concerns. Continue to monitor.        Follow up plan: Return if symptoms worsen or fail to improve.    . This visit was completed via MyChart due to the restrictions of the COVID-19 pandemic. All issues as above were discussed and addressed. Physical exam was done as above through visual confirmation on MyChart. If it was felt that the patient should be evaluated in the office, they were directed there. The patient verbally consented to this visit. . Location of the patient: home . Location of the provider: home . Those involved with this call:  . Provider: Park Liter, DO . CMA: Tiffany Reel, CMA . Front Desk/Registration: Don Perking  . Time spent on call: 10 minutes with patient face to face via video conference. More than 50% of this time was spent in counseling and coordination of care. 15 minutes total spent in review of patient's record and preparation of their chart.

## 2019-10-26 DIAGNOSIS — Z85810 Personal history of malignant neoplasm of tongue: Secondary | ICD-10-CM | POA: Diagnosis not present

## 2019-10-26 DIAGNOSIS — H93291 Other abnormal auditory perceptions, right ear: Secondary | ICD-10-CM | POA: Diagnosis not present

## 2019-11-12 ENCOUNTER — Encounter: Payer: Self-pay | Admitting: Family Medicine

## 2019-11-12 ENCOUNTER — Ambulatory Visit (INDEPENDENT_AMBULATORY_CARE_PROVIDER_SITE_OTHER): Payer: 59 | Admitting: Family Medicine

## 2019-11-12 VITALS — BP 96/67 | HR 90 | Temp 98.0°F

## 2019-11-12 DIAGNOSIS — J0100 Acute maxillary sinusitis, unspecified: Secondary | ICD-10-CM

## 2019-11-12 MED ORDER — CLARITHROMYCIN 500 MG PO TABS: 500.0000 mg | ORAL_TABLET | Freq: Two times a day (BID) | ORAL | 0 refills | Status: DC

## 2019-11-12 NOTE — Progress Notes (Signed)
BP 96/67   Pulse 90   Temp 98 F (36.7 C)    Subjective:    Patient ID: Wendy Kelly, female    DOB: 1956/06/28, 64 y.o.   MRN: [id number]  HPI: Wendy Kelly is a 64 y.o. female  Chief Complaint  Patient presents with  . Sinus Problem   UPPER RESPIRATORY TRACT INFECTION Duration: weeks, worse on Friday Worst symptom: congestion and facial pressure Fever: no Cough: yes Shortness of breath: no Wheezing: no Chest pain: no Chest tightness: no Chest congestion: no Nasal congestion: yes Runny nose: yes Post nasal drip: yes Sneezing: no Sore throat: yes Swollen glands: no Sinus pressure: yes Headache: yes Face pain: yes Toothache: yes Ear pain: no  Ear pressure: no  Eyes red/itching:no Eye drainage/crusting: no  Vomiting: no Rash: no Fatigue: yes Sick contacts: no Strep contacts: no  Context: worse Recurrent sinusitis: no Relief with OTC cold/cough medications: no  Treatments attempted: allegra and Singulair    Relevant past medical, surgical, family and social history reviewed and updated as indicated. Interim medical history since our last visit reviewed. Allergies and medications reviewed and updated.  Review of Systems  Constitutional: Positive for fatigue. Negative for activity change, appetite change, chills, diaphoresis, fever and unexpected weight change.  HENT: Positive for congestion, postnasal drip, rhinorrhea, sinus pressure, sinus pain, sneezing and sore throat. Negative for dental problem, drooling, ear discharge, ear pain, facial swelling, hearing loss, mouth sores, nosebleeds, tinnitus, trouble swallowing and voice change.   Eyes: Negative.   Respiratory: Positive for cough. Negative for apnea, choking, chest tightness, shortness of breath, wheezing and stridor.   Cardiovascular: Negative.   Gastrointestinal: Negative.   Skin: Negative.   Psychiatric/Behavioral: Negative.     Per HPI unless specifically indicated above     Objective:    BP 96/67   Pulse 90   Temp 98 F (36.7 C)   Wt Readings from Last 3 Encounters:  09/11/19 140 lb (63.5 kg)  07/23/19 (P) 140 lb 1.6 oz (63.5 kg)  06/19/19 141 lb 12 oz (64.3 kg)    Physical Exam Vitals and nursing note reviewed.  Pulmonary:     Effort: Pulmonary effort is normal. No respiratory distress.     Comments: Speaking in full sentences Neurological:     Mental Status: She is alert.  Psychiatric:        Mood and Affect: Mood normal.        Behavior: Behavior normal.        Thought Content: Thought content normal.        Judgment: Judgment normal.     Results for orders placed or performed in visit on 09/11/19  CUP PACEART REMOTE DEVICE CHECK  Result Value Ref Range   Date Time Interrogation Session [id number]    Pulse Generator Manufacturer MERM    Pulse Gen Model ADDRL1 Adapta    Pulse Gen Serial Number [id number]    Clinic Name [place] Pulse Generator Type Implantable Pulse Generator    Implantable Pulse Generator Implant Date [id number]    Implantable Lead Manufacturer MERM    Implantable Lead Model 4092 CapSure SP Novus    Implantable Lead Serial Number [id number]    Implantable Lead Implant Date CA:[id number]    Implantable Lead Location [id number]    Implantable Lead Manufacturer MERM    Implantable Lead Model 4592 CapSure SP Novus    Implantable Lead Serial Number [id number]    Implantable Lead Implant Date CA:[id number]  Implantable Lead Location [phone]    Lead Channel Setting Sensing Sensitivity 4.00 mV   Lead Channel Setting Pacing Amplitude 2.000 V   Lead Channel Setting Pacing Pulse Width 0.40 ms   Lead Channel Setting Pacing Amplitude 2.500 V   Lead Channel Impedance Value 593 ohm   Lead Channel Pacing Threshold Amplitude 0.375 V   Lead Channel Pacing Threshold Pulse Width 0.40 ms   Lead Channel Impedance Value 722 ohm   Lead Channel Pacing Threshold Amplitude 1.000 V   Lead Channel Pacing Threshold Pulse Width 0.40 ms   Battery  Status OK    Battery Remaining Longevity 138 mo   Battery Voltage 2.79 V   Battery Impedance 187 ohm   Brady Statistic AP VP Percent 0 %   Brady Statistic AS VP Percent 0 %   Brady Statistic AP VS Percent 15 %   Brady Statistic AS VS Percent 85 %      Assessment & Plan:   Problem List Items Addressed This Visit    None    Visit Diagnoses    Acute non-recurrent maxillary sinusitis    -  Primary   Will treat with clarithromycin. Call with any concerns or if not getting better. Conitnue to monitor.    Relevant Medications   clarithromycin (BIAXIN) 500 MG tablet       Follow up plan: Return PRN.   Marland Kitchen This visit was completed via telephone due to the restrictions of the COVID-19 pandemic. All issues as above were discussed and addressed but no physical exam was performed. If it was felt that the patient should be evaluated in the office, they were directed there. The patient verbally consented to this visit. Patient was unable to complete an audio/visual visit due to Lack of equipment. Due to the catastrophic nature of the COVID-19 pandemic, this visit was done through audio contact only. . Location of the patient: home . Location of the provider: work . Those involved with this call:  . Provider: Park Liter, DO . CMA: Lauretta Grill, RMA . Front Desk/Registration: Don Perking  . Time spent on call: 15 minutes on the phone discussing health concerns. 23 minutes total spent in review of patient's record and preparation of their chart.

## 2019-11-15 ENCOUNTER — Encounter: Payer: Self-pay | Admitting: Family Medicine

## 2019-11-15 ENCOUNTER — Ambulatory Visit: Payer: Self-pay

## 2019-11-15 IMAGING — PT NM PET TUM IMG INITIAL (PI) SKULL BASE T - THIGH
1 of 9 series · 1 of 25 positions shown · non-contrast
Comparison: None.

ADDENDUM:
Correction: In the NECK FINDING section:

"Enlarged RIGHT level 2 lymph node measuring 20 mm short axis"
CLINICAL DATA: Initial treatment strategy for lymphadenopathy..
EXAM:
NUCLEAR MEDICINE PET SKULL BASE TO THIGH
TECHNIQUE: 12.9 mCi F-18 FDG was injected intravenously. Full-ring PET imaging
was performed from the skull base to thigh after the radiotracer. CT
data was obtained and used for attenuation correction and anatomic
localization.
FASTING BLOOD GLUCOSE:  Value: 91 mg/dl

[Series 4: ct wb 5.0 b30f · axial · 5.0mm · 0.98mm/px · 1 of 290 slices shown]
[im 290/290  brain]
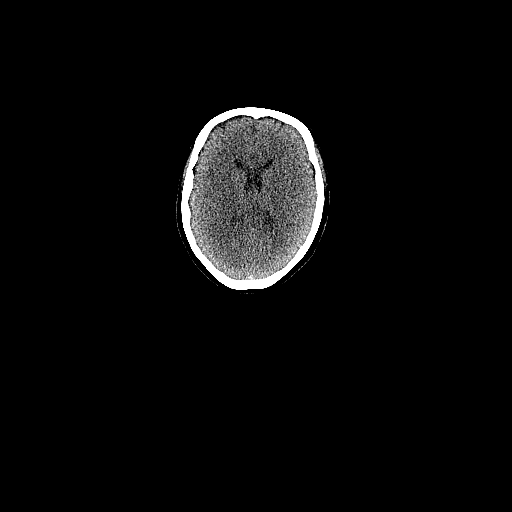

[1 of 25 positions shown; findings below may reference images not displayed]

FINDINGS: NECK

The enlarged LEFT level 2 lymph node measuring 20 mm short axis
which has intense metabolic activity SUV max equal 11.1.

No clear primary malignancy identified within the oropharynx or
hypopharynx.

Mild asymmetric metabolic activity at RIGHT base of tongue with SUV
max equal 4.5.

Mile focal activity associated with posterior aspect of the glottis
with SUV max equal 4.0. No mass lesion identified (image 245 ).

CHEST

No hypermetabolic mediastinal or hilar nodes. No suspicious
pulmonary nodules on the CT scan.

ABDOMEN/PELVIS

No abnormal hypermetabolic activity within the liver, pancreas,
adrenal glands, or spleen. No hypermetabolic lymph nodes in the
abdomen or pelvis.

Bilateral benign adrenal adenomas post hysterectomy. Sigmoid
diverticulosis

SKELETON

No focal hypermetabolic activity to suggest skeletal metastasis.
IMPRESSION: 1. Intensely hypermetabolic enlarged lymph node in the RIGHT level
II nodal station consistent with metastatic squamous cell carcinoma.
2. No clear site of primary malignancy within the head neck.
Potential site of investigation would include the RIGHT base of
tongue and a less suspicious region in the posterior glottis.
3. No evidence distant metastatic disease

## 2019-11-15 MED ORDER — DOXYCYCLINE HYCLATE 100 MG PO TABS: 100.0000 mg | ORAL_TABLET | Freq: Two times a day (BID) | ORAL | 0 refills | Status: DC

## 2019-11-15 NOTE — Telephone Encounter (Signed)
Patient notified and verbalized understanding. 

## 2019-11-15 NOTE — Telephone Encounter (Signed)
Patient states that she is feeling better, but still coughing up green at some times.

## 2019-11-15 NOTE — Telephone Encounter (Signed)
Please find out if she's better. If she's better, no need for more medicine. If she's worse, we're in a tough spot because she is allergic to most antibiotics.

## 2019-11-15 NOTE — Telephone Encounter (Signed)
Pt. Reports she started Biaxin this week and ever since has had some insomnia, itching, lips feel tingly. Took a 12 hour Allegra and "it has helped this morning." States "my sinuses feel better, but I can't take anymore of the antibiotic.I have Benadryl to take if I get itchy again." Denies any shortness of breath, chest pain or difficulty swallowing. Offered appointment. States "I don't need to come in , I just want to know if I need a different antibiotic." Please advise pt.  Answer Assessment - Initial Assessment Questions 1.   NAME of MEDICATION: "What medicine are you calling about?"     Biaxin 2.   QUESTION: "What is your question?"     Antibiotic cause itching, lips tingling 3.   PRESCRIBING HCP: "Who prescribed it?" Reason: if prescribed by specialist, call should be referred to that group.     Dr. Wynetta Emery 4. SYMPTOMS: "Do you have any symptoms?"     Itching, tingling to lips 5. SEVERITY: If symptoms are present, ask "Are they mild, moderate or severe?"     Moderate 6.  PREGNANCY:  "Is there any chance that you are pregnant?" "When was your last menstrual period?"     No  Protocols used: MEDICATION QUESTION CALL-A-AH

## 2019-11-15 NOTE — Telephone Encounter (Signed)
She has a possible allergy to tetracycline, but wasn't sure. I'm going to send in doxycycline to treat her sinus infection. If she's not doing better by Saturday- she can start it, but just be on the watch for any allergic reactions.

## 2019-11-16 ENCOUNTER — Telehealth: Payer: Self-pay | Admitting: Family Medicine

## 2019-11-16 NOTE — Telephone Encounter (Signed)
Called pt to confirm which medication. Pt referring to Doxycycline.   Called pharmacy, they stated they had not received medication. I called Medication in.   Call [name] and let her know.

## 2019-11-16 NOTE — Telephone Encounter (Signed)
Copied from [place] [id number]. Topic: General - Other >> Nov 16, 2019  9:01 AM Keene Breath wrote: Reason for CRM: Called to inform the doctor that their records show the patient has an allergy to a script that was sent over for doxycycline (VIBRA-TABS) 100 MG tablet.  Please advise and call to discuss before the script is filled.  CB# [phone]

## 2019-11-16 NOTE — Telephone Encounter (Signed)
Spoke with Pt and let her know medication should be ready for pick up today.

## 2019-11-16 NOTE — Telephone Encounter (Signed)
Is this ok to fill? 

## 2019-11-16 NOTE — Telephone Encounter (Signed)
Pharmacy notified.

## 2019-11-16 NOTE — Telephone Encounter (Signed)
She had a potential allergy to tetracycline, but was also taking flagyl at the same time. It is OK to fill.

## 2019-11-19 ENCOUNTER — Inpatient Hospital Stay: Payer: 59 | Attending: Oncology

## 2019-12-04 ENCOUNTER — Ambulatory Visit (INDEPENDENT_AMBULATORY_CARE_PROVIDER_SITE_OTHER): Payer: 59 | Admitting: Nurse Practitioner

## 2019-12-04 ENCOUNTER — Encounter: Payer: Self-pay | Admitting: Nurse Practitioner

## 2019-12-04 ENCOUNTER — Other Ambulatory Visit: Payer: Self-pay

## 2019-12-04 VITALS — BP 119/68 | HR 77 | Temp 98.6°F | Wt 147.2 lb

## 2019-12-04 DIAGNOSIS — R319 Hematuria, unspecified: Secondary | ICD-10-CM | POA: Diagnosis not present

## 2019-12-04 DIAGNOSIS — R300 Dysuria: Secondary | ICD-10-CM | POA: Diagnosis not present

## 2019-12-04 NOTE — Progress Notes (Signed)
BP 119/68   Pulse 77   Temp 98.6 F (37 C)   Wt 147 lb 4 oz (66.8 kg)   SpO2 100%   BMI 26.08 kg/m    Subjective:    Patient ID: Wendy Kelly, female    DOB: 01/04/1956, 64 y.o.   MRN: [id number]  HPI: Wendy Kelly is a 64 y.o. female presenting for blood in her liner.  Chief Complaint  Patient presents with  . Other    Patient states that when she used the restroom she had blood on her liner   GENITOURINARY BLEEDING Reports yesterday, she was having cramp.  This is not unusual for patient as she has had cramps since mesh removal, bladder re-tacking, and hysterectomy surgery.  Yesterday, she noticed blood on her panty liner after cramping.  She reports it was a "little dot" of blood.  She reports some skin irritation in her genital area that she thinks may have caused the dot of blood, however reports the blood was "very high up."  She has not had any constipation, hemorrhoids, or trouble with bowel movements.  Duration: days Discharge description: "dot" of blood Pruritus: no Dysuria: no Malodorous: no Urinary frequency: no Fevers: no Abdominal pain: yes  Sexual activity: currently sexually active; several weeks since intercourse History of sexually transmitted diseases: no Recent antibiotic use: yes; Viaxin for sinus infection Context: worse  Treatments attempted: nothing  Allergies  Allergen Reactions  . Cefdinir Swelling    Swelling in mouth   . Cephalosporins Swelling  . Dairy Aid [Lactase] Swelling    Milk and milk products cause swelling and tingling of tongue  . Flagyl [Metronidazole] Swelling    Mouth and throat and ears go red and burn  . Lactose Intolerance (Gi) Other (See Comments)    GI upset.  Eats only gluten free  . Penicillins Rash    Rash/hives  . Phenylephrine-Guaifenesin Anaphylaxis    Night terrors  . Sulfa Antibiotics Rash and Hives    Other reaction(s): Unknown   . Sulfonamide Derivatives Rash    Rash/itching.  From head to her toes,  the itching was horrible. Penicillin is the worst  . Tape Rash    Adhesives all cause a problem severely.  It doesn't matter if it is paper tape or not.  Marland Kitchen Dexilant [Dexlansoprazole]     Pressure in head like head is in a vice and being squeezed  . Entex Lq [Phenylephrine-Guaifenesin]     Night terrors  . Other Other (See Comments)    Flu vaccine:  Weakness, dizziness, tia type symptoms.    . Oxycodone Nausea And Vomiting    And itching  . Succinylcholine Other (See Comments)    Trouble waking up  . Vancomycin Rash  . Clarithromycin Itching and Other (See Comments)  . Tetracycline Other (See Comments)    Unkown  . Codeine Nausea And Vomiting  . Guaifenesin & Derivatives Other (See Comments)    Night terrors  . Lactobacillus Itching  . Naproxen Sodium     Itching/rash  . Pseudoephedrine Rash  . Tetracyclines & Related Other (See Comments)    Was taking flagyl at the same time; unsure which caused the swelling  Swelling of throat. Pharmacist states that he believes it was the flagyl and not the tetracycline  . Wheat Bran Other (See Comments)    Upset stomach. Now eats gluten free   Outpatient Encounter Medications as of 12/04/2019  Medication Sig Note  . aspirin 81 MG tablet Take  81 mg by mouth daily.   Marland Kitchen b complex vitamins tablet Take 2 tablets by mouth daily.    Marland Kitchen BLACK ELDERBERRY,BERRY-FLOWER, PO Take 2 capsules by mouth daily.    . Calcium Carb-Cholecalciferol (CALCIUM 500 + D3 PO) Take 2 Doses by mouth daily.    Marland Kitchen CRANBERRY SOFT PO Take 2 each by mouth daily.    . Digestive Enzymes (DIGESTIVE ENZYME PO) Take 1-2 capsules by mouth daily. 11/09/2018: Off an on when needed  . doxycycline (VIBRA-TABS) 100 MG tablet Take 1 tablet (100 mg total) by mouth 2 (two) times daily.   Marland Kitchen ipratropium (ATROVENT) 0.03 % nasal spray Place 2 sprays into both nostrils every 12 (twelve) hours.   Marland Kitchen levothyroxine (SYNTHROID) 75 MCG tablet Take 1 tablet (75 mcg total) by mouth daily. BRAND NAME  SYNTHROID NECESSARY   . Loratadine (CLARITIN PO) Take by mouth daily.   . montelukast (SINGULAIR) 10 MG tablet Take 10 mg by mouth at bedtime.   Marland Kitchen PRESCRIPTION MEDICATION Place 1 tablet vaginally 3 (three) times a week. E3 1mg  and Testosterone 1mg  compound   . [DISCONTINUED] clarithromycin (BIAXIN) 500 MG tablet Take 1 tablet (500 mg total) by mouth 2 (two) times daily.    Facility-Administered Encounter Medications as of 12/04/2019  Medication  . 0.9 %  sodium chloride infusion   Patient Active Problem List   Diagnosis Date Noted  . Genitourinary bleeding 12/04/2019  . COVID-19 virus infection 09/11/2019  . Encounter for screening colonoscopy   . Polyp of descending colon   . Hypothyroidism 06/27/2018  . Dehydration 08/11/2017  . 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 Goals of care, counseling/discussion 06/14/2017  . Atrial fibrillation ([place]) 04/19/2017  . Problems with swallowing and mastication   . TIA (transient ischemic attack) 05/02/2015  . Ascending aortic aneurysm ([place]) 10/23/2012  . AV block, 2nd degree   . Expressive aphasia 06/11/2011  . Orthostatic hypotension 06/11/2011  . 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 08/20/2009   Past Medical History:  Diagnosis Date  . AA (aortic aneurysm) ([place])    a. 04/2011 Echo: Ao Root: 4.1cm, Asc Ao 4.7cm.  . Anxiety   . AV block, 2nd degree    a. 05/2005 - s/p MDT Adapta ADDR01 Dual Chamber PPM  . Bicuspid aortic valve   . Cancer ([place])    Throat  . Chest pain    a. 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  Marland Kitchen Deep vein thrombosis (DVT) of left lower extremity ([place]) 10/03/2017  . Dehydration 08/11/2017  . Depression   . Expressive aphasia    a. ongoing since 04/2011 - seen by neurology - ? TIA vs. Migraine  . Facial numbness    a. ongoing since 04/2011 - seen by neurology - ? TIA vs. Migraine  . GERD (gastroesophageal reflux disease)   . Low blood pressure   . Personal history of chemotherapy 2018-2019   Throat  . Personal history of  radiation therapy 2018-2019   Throat  . Presence of permanent cardiac pacemaker   . Shingles    pt had on right side of head and his neck  . 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 Syncope    a. 04/2011 Echo: EF 55-65%, No RWMA, Gr 1 DD.  Marland Kitchen Thyroid disease    Relevant past medical, surgical, family and social history reviewed and updated as indicated. Interim medical history since our last visit reviewed.  Review of Systems  Constitutional: Negative.  Negative for activity change, appetite  change, fatigue and fever.  Gastrointestinal: Positive for abdominal pain (cramping, chronic per patient).  Genitourinary: Positive for dyspareunia (chronic per patient) and vaginal bleeding. Negative for decreased urine volume, dysuria, flank pain, frequency, genital sores, hematuria, pelvic pain, urgency, vaginal discharge and vaginal pain.  Musculoskeletal: Negative.   Skin: Negative.   Neurological: Negative.   Psychiatric/Behavioral: Negative.    Per HPI unless specifically indicated above     Objective:    BP 119/68   Pulse 77   Temp 98.6 F (37 C)   Wt 147 lb 4 oz (66.8 kg)   SpO2 100%   BMI 26.08 kg/m   Wt Readings from Last 3 Encounters:  12/04/19 147 lb 4 oz (66.8 kg)  09/11/19 140 lb (63.5 kg)  07/23/19 (P) 140 lb 1.6 oz (63.5 kg)    Physical Exam Vitals and nursing note reviewed. Exam conducted with a chaperone present.  Constitutional:      General: She is not in acute distress.    Appearance: Normal appearance. She is not toxic-appearing.  Abdominal:     General: Abdomen is flat. There is no distension.  Genitourinary:    General: Normal vulva.     Exam position: Lithotomy position.     Pubic Area: No rash.      Labia:        Right: No rash, tenderness or lesion.        Left: No rash, tenderness or lesion.      Vagina: No foreign body. Tenderness present. No vaginal discharge, erythema or bleeding.     Uterus: Absent.      Rectum:  Normal.  Lymphadenopathy:     Lower Body: No right inguinal adenopathy. No left inguinal adenopathy.  Skin:    General: Skin is warm and dry.     Coloration: Skin is not jaundiced or pale.  Neurological:     General: No focal deficit present.     Mental Status: She is alert and oriented to person, place, and time.     Motor: No weakness.     Gait: Gait normal.  Psychiatric:        Mood and Affect: Mood normal.        Behavior: Behavior normal.        Thought Content: Thought content normal.        Judgment: Judgment normal.       Assessment & Plan:   Problem List Items Addressed This Visit      Genitourinary   Genitourinary bleeding - Primary    Acute, ongoing.  UA negative for blood today, will send for culture to be on safe side not missing infection.  No blood visualized in vaginal vault on examination. Unclear etiology of blood on panty liner.  Offered transvaginal ultrasound, patient declined for now.  Strongly encouraged to monitor symptoms closely, with any recurrence or persistence, return to clinic.      Relevant Orders   UA/M w/rflx Culture, Routine (STAT)       Follow up plan: Return if symptoms worsen or fail to improve.

## 2019-12-04 NOTE — Assessment & Plan Note (Signed)
Acute, ongoing.  UA negative for blood today, will send for culture to be on safe side not missing infection.  No blood visualized in vaginal vault on examination. Unclear etiology of blood on panty liner.  Offered transvaginal ultrasound, patient declined for now.  Strongly encouraged to monitor symptoms closely, with any recurrence or persistence, return to clinic.

## 2019-12-06 LAB — UA/M W/RFLX CULTURE, ROUTINE
Bilirubin, UA: NEGATIVE
Leukocytes,UA: NEGATIVE
Nitrite, UA: NEGATIVE
Protein,UA: NEGATIVE
RBC, UA: NEGATIVE
Specific Gravity, UA: 1.025 (ref 1.005–1.030)
Urobilinogen, Ur: 0.2 mg/dL (ref 0.2–1.0)
pH, UA: 5 (ref 5.0–7.5)

## 2019-12-06 LAB — MICROSCOPIC EXAMINATION
Bacteria, UA: NONE SEEN
RBC, Urine: NONE SEEN /hpf (ref 0–2)
WBC, UA: NONE SEEN /hpf (ref 0–5)

## 2019-12-06 LAB — URINE CULTURE, REFLEX

## 2019-12-11 ENCOUNTER — Ambulatory Visit (INDEPENDENT_AMBULATORY_CARE_PROVIDER_SITE_OTHER): Payer: 59 | Admitting: *Deleted

## 2019-12-11 DIAGNOSIS — I480 Paroxysmal atrial fibrillation: Secondary | ICD-10-CM

## 2019-12-11 DIAGNOSIS — I441 Atrioventricular block, second degree: Secondary | ICD-10-CM

## 2019-12-11 NOTE — Progress Notes (Signed)
Remote pacemaker transmission.   

## 2019-12-12 ENCOUNTER — Telehealth: Payer: Self-pay | Admitting: Internal Medicine

## 2019-12-12 LAB — CUP PACEART REMOTE DEVICE CHECK
Battery Impedance: 236 Ohm
Battery Remaining Longevity: 129 mo
Battery Voltage: 2.78 V
Brady Statistic AP VP Percent: 0 %
Brady Statistic AP VS Percent: 15 %
Brady Statistic AS VP Percent: 0 %
Brady Statistic AS VS Percent: 85 %
Date Time Interrogation Session: 20210601053648
Implantable Lead Implant Date: 20061128
Implantable Lead Implant Date: 20061128
Implantable Lead Location: 753859
Implantable Lead Location: 753860
Implantable Lead Model: 4092
Implantable Lead Model: 4592
Implantable Pulse Generator Implant Date: 20161123
Lead Channel Impedance Value: 556 Ohm
Lead Channel Impedance Value: 701 Ohm
Lead Channel Pacing Threshold Amplitude: 0.375 V
Lead Channel Pacing Threshold Amplitude: 1.25 V
Lead Channel Pacing Threshold Pulse Width: 0.4 ms
Lead Channel Pacing Threshold Pulse Width: 0.4 ms
Lead Channel Setting Pacing Amplitude: 2 V
Lead Channel Setting Pacing Amplitude: 2.5 V
Lead Channel Setting Pacing Pulse Width: 0.4 ms
Lead Channel Setting Sensing Sensitivity: 2.8 mV

## 2019-12-12 NOTE — Telephone Encounter (Signed)
Patient calling in after remote pacer check yesterday and then noticed on mychart she had an AVS. The AVS stated she was talked to about her A Fib, patient states she has never been told she has had A Fib and that she did not talk to anyone for a "visit", she only sent in her remote check. Patient is very concerned something was found and that she had to learn about it through [place], someone else's information was sent to her MyChart and also that she may be billed for a visit when she was not seen or talked to.  Please advise

## 2019-12-12 NOTE — Telephone Encounter (Signed)
Attempted to call patient in regards to question on AVS. No answer, LMOVM.

## 2019-12-12 NOTE — Telephone Encounter (Signed)
Patient called, spoke to her regarding diagnosis on AVS. Changed/corrected diagnosis from AF to 2nd degree AV block. Last remote transmission shows AT/AF burden <0.1%, EGM shows 1:1 SVT. Patient informed AF is taken off her chart as it was entered incorrectly.  Patient relieved and advised if she has any questions or concerns to please feel free to contact DC at [phone].

## 2019-12-14 ENCOUNTER — Inpatient Hospital Stay: Payer: 59 | Attending: Oncology

## 2019-12-14 ENCOUNTER — Other Ambulatory Visit: Payer: Self-pay

## 2019-12-14 DIAGNOSIS — C760 Malignant neoplasm of head, face and neck: Secondary | ICD-10-CM | POA: Insufficient documentation

## 2019-12-14 DIAGNOSIS — Z8589 Personal history of malignant neoplasm of other organs and systems: Secondary | ICD-10-CM

## 2019-12-14 DIAGNOSIS — Z08 Encounter for follow-up examination after completed treatment for malignant neoplasm: Secondary | ICD-10-CM

## 2019-12-14 LAB — CBC WITH DIFFERENTIAL/PLATELET
Abs Immature Granulocytes: 0.01 10*3/uL (ref 0.00–0.07)
Basophils Absolute: 0 10*3/uL (ref 0.0–0.1)
Basophils Relative: 1 %
Eosinophils Absolute: 0.1 10*3/uL (ref 0.0–0.5)
Eosinophils Relative: 3 %
HCT: 36.8 % (ref 36.0–46.0)
Hemoglobin: 12.6 g/dL (ref 12.0–15.0)
Immature Granulocytes: 0 %
Lymphocytes Relative: 19 %
Lymphs Abs: 0.7 10*3/uL (ref 0.7–4.0)
MCH: 31.4 pg (ref 26.0–34.0)
MCHC: 34.2 g/dL (ref 30.0–36.0)
MCV: 91.8 fL (ref 80.0–100.0)
Monocytes Absolute: 0.3 10*3/uL (ref 0.1–1.0)
Monocytes Relative: 8 %
Neutro Abs: 2.6 10*3/uL (ref 1.7–7.7)
Neutrophils Relative %: 69 %
Platelets: 127 10*3/uL — ABNORMAL LOW (ref 150–400)
RBC: 4.01 MIL/uL (ref 3.87–5.11)
RDW: 12.5 % (ref 11.5–15.5)
WBC: 3.9 10*3/uL — ABNORMAL LOW (ref 4.0–10.5)
nRBC: 0 % (ref 0.0–0.2)

## 2019-12-26 DIAGNOSIS — H43813 Vitreous degeneration, bilateral: Secondary | ICD-10-CM | POA: Diagnosis not present

## 2020-01-03 DIAGNOSIS — R202 Paresthesia of skin: Secondary | ICD-10-CM | POA: Diagnosis not present

## 2020-01-03 DIAGNOSIS — D2261 Melanocytic nevi of right upper limb, including shoulder: Secondary | ICD-10-CM | POA: Diagnosis not present

## 2020-01-03 DIAGNOSIS — D2271 Melanocytic nevi of right lower limb, including hip: Secondary | ICD-10-CM | POA: Diagnosis not present

## 2020-01-03 DIAGNOSIS — D2262 Melanocytic nevi of left upper limb, including shoulder: Secondary | ICD-10-CM | POA: Diagnosis not present

## 2020-01-03 DIAGNOSIS — L570 Actinic keratosis: Secondary | ICD-10-CM | POA: Diagnosis not present

## 2020-01-03 DIAGNOSIS — X32XXXA Exposure to sunlight, initial encounter: Secondary | ICD-10-CM | POA: Diagnosis not present

## 2020-01-03 DIAGNOSIS — D225 Melanocytic nevi of trunk: Secondary | ICD-10-CM | POA: Diagnosis not present

## 2020-01-03 DIAGNOSIS — Z85828 Personal history of other malignant neoplasm of skin: Secondary | ICD-10-CM | POA: Diagnosis not present

## 2020-01-11 ENCOUNTER — Encounter: Payer: Self-pay | Admitting: Family Medicine

## 2020-01-11 ENCOUNTER — Other Ambulatory Visit: Payer: Self-pay

## 2020-01-11 ENCOUNTER — Ambulatory Visit (INDEPENDENT_AMBULATORY_CARE_PROVIDER_SITE_OTHER): Payer: PPO | Admitting: Family Medicine

## 2020-01-11 VITALS — BP 102/64 | HR 69 | Temp 98.4°F | Wt 144.8 lb

## 2020-01-11 DIAGNOSIS — E039 Hypothyroidism, unspecified: Secondary | ICD-10-CM | POA: Diagnosis not present

## 2020-01-11 NOTE — Progress Notes (Signed)
BP 102/64 (BP Location: Left Arm, Patient Position: Sitting, Cuff Size: Normal)   Pulse 69   Temp 98.4 F (36.9 C) (Oral)   Wt 144 lb 12.8 oz (65.7 kg)   SpO2 99%   BMI 25.65 kg/m    Subjective:    Patient ID: Wendy Kelly, female    DOB: 1956/06/24, 64 y.o.   MRN: [id number]  HPI: [name] is a 64 y.o. female  Chief Complaint  Patient presents with  . Hypothyroidism   HYPOTHYROIDISM Thyroid control status: unsure Satisfied with current treatment? yes Medication side effects: no Medication compliance: excellent compliance Recent dose adjustment:yes Fatigue: yes Cold intolerance: yes Heat intolerance: yes Weight gain: no Weight loss: no Constipation: no Diarrhea/loose stools: no Palpitations: no Lower extremity edema: no Anxiety/depressed mood: no  Relevant past medical, surgical, family and social history reviewed and updated as indicated. Interim medical history since our last visit reviewed. Allergies and medications reviewed and updated.  Review of Systems  Constitutional: Negative.   Respiratory: Negative.   Cardiovascular: Negative.   Gastrointestinal: Negative.   Endocrine: Positive for cold intolerance and heat intolerance. Negative for polydipsia, polyphagia and polyuria.  Hematological: Negative.   Psychiatric/Behavioral: Negative.     Per HPI unless specifically indicated above     Objective:    BP 102/64 (BP Location: Left Arm, Patient Position: Sitting, Cuff Size: Normal)   Pulse 69   Temp 98.4 F (36.9 C) (Oral)   Wt 144 lb 12.8 oz (65.7 kg)   SpO2 99%   BMI 25.65 kg/m   Wt Readings from Last 3 Encounters:  01/11/20 144 lb 12.8 oz (65.7 kg)  12/04/19 147 lb 4 oz (66.8 kg)  09/11/19 140 lb (63.5 kg)    Physical Exam Vitals and nursing note reviewed.  Constitutional:      General: She is not in acute distress.    Appearance: Normal appearance. She is not ill-appearing, toxic-appearing or diaphoretic.  HENT:     Head:  Normocephalic and atraumatic.     Right Ear: External ear normal.     Left Ear: External ear normal.     Nose: Nose normal.     Mouth/Throat:     Mouth: Mucous membranes are moist.     Pharynx: Oropharynx is clear.  Eyes:     General: No scleral icterus.       Right eye: No discharge.        Left eye: No discharge.     Extraocular Movements: Extraocular movements intact.     Conjunctiva/sclera: Conjunctivae normal.     Pupils: Pupils are equal, round, and reactive to light.  Cardiovascular:     Rate and Rhythm: Normal rate and regular rhythm.     Pulses: Normal pulses.     Heart sounds: Normal heart sounds. No murmur heard.  No friction rub. No gallop.   Pulmonary:     Effort: Pulmonary effort is normal. No respiratory distress.     Breath sounds: Normal breath sounds. No stridor. No wheezing, rhonchi or rales.  Chest:     Chest wall: No tenderness.  Musculoskeletal:        General: Normal range of motion.     Cervical back: Normal range of motion and neck supple.  Skin:    General: Skin is warm and dry.     Capillary Refill: Capillary refill takes less than 2 seconds.     Coloration: Skin is not jaundiced or pale.     Findings: No bruising,  erythema, lesion or rash.  Neurological:     General: No focal deficit present.     Mental Status: She is alert and oriented to person, place, and time. Mental status is at baseline.  Psychiatric:        Mood and Affect: Mood normal.        Behavior: Behavior normal.        Thought Content: Thought content normal.        Judgment: Judgment normal.     Results for orders placed or performed in visit on 12/14/19  CBC with Differential/Platelet  Result Value Ref Range   WBC 3.9 (L) 4.0 - 10.5 K/uL   RBC 4.01 3.87 - 5.11 MIL/uL   Hemoglobin 12.6 12.0 - 15.0 g/dL   HCT 36.8 36 - 46 %   MCV 91.8 80.0 - 100.0 fL   MCH 31.4 26.0 - 34.0 pg   MCHC 34.2 30.0 - 36.0 g/dL   RDW 12.5 11.5 - 15.5 %   Platelets 127 (L) 150 - 400 K/uL   nRBC  0.0 0.0 - 0.2 %   Neutrophils Relative % 69 %   Neutro Abs 2.6 1.7 - 7.7 K/uL   Lymphocytes Relative 19 %   Lymphs Abs 0.7 0.7 - 4.0 K/uL   Monocytes Relative 8 %   Monocytes Absolute 0.3 0 - 1 K/uL   Eosinophils Relative 3 %   Eosinophils Absolute 0.1 0 - 0 K/uL   Basophils Relative 1 %   Basophils Absolute 0.0 0 - 0 K/uL   Immature Granulocytes 0 %   Abs Immature Granulocytes 0.01 0.00 - 0.07 K/uL      Assessment & Plan:   Problem List Items Addressed This Visit      Endocrine   Hypothyroidism - Primary    Rechecking labs today. Await results. Treat as needed.       Relevant Orders   Thyroid Panel With TSH       Follow up plan: Return October, welcome to medicare.

## 2020-01-11 NOTE — Assessment & Plan Note (Signed)
Rechecking labs today. Await results. Treat as needed.  °

## 2020-01-12 LAB — THYROID PANEL WITH TSH
Free Thyroxine Index: 1.8 (ref 1.2–4.9)
T3 Uptake Ratio: 26 % (ref 24–39)
T4, Total: 6.8 ug/dL (ref 4.5–12.0)
TSH: 2.34 u[IU]/mL (ref 0.450–4.500)

## 2020-01-16 DIAGNOSIS — Z20822 Contact with and (suspected) exposure to covid-19: Secondary | ICD-10-CM | POA: Diagnosis not present

## 2020-01-16 DIAGNOSIS — Z03818 Encounter for observation for suspected exposure to other biological agents ruled out: Secondary | ICD-10-CM | POA: Diagnosis not present

## 2020-01-18 ENCOUNTER — Ambulatory Visit: Payer: 59 | Admitting: Family Medicine

## 2020-01-25 ENCOUNTER — Other Ambulatory Visit: Payer: Self-pay | Admitting: *Deleted

## 2020-01-25 DIAGNOSIS — I712 Thoracic aortic aneurysm, without rupture, unspecified: Secondary | ICD-10-CM

## 2020-02-14 ENCOUNTER — Telehealth: Payer: Self-pay | Admitting: Family Medicine

## 2020-02-14 NOTE — Telephone Encounter (Signed)
Copied from [place] [id number]. Topic: General - Other >> Feb 14, 2020  9:30 AM Rainey Pines A wrote: Patient stated that she needs a callback as soon as possible in regards to her contacting her pharmacy and they stated the request for her medical supplies was denied . I do not see this documented in the system however the patient stated tthat this happends everytime and happened 6 weeks ago as well. Patient is requesting a callback from Dr. Bard Herbert nurse

## 2020-02-14 NOTE — Telephone Encounter (Signed)
Called and spoke to patient. She states that she is completely out of her compound tablets, the E3 and Testosterone, and needs a new RX. She states that the pharmacy said that we keep denying this prescription for some reason.   Spoke with Apolonio Schneiders. New RX written, signed by her, and faxed to [facility] for the patient.

## 2020-02-18 DIAGNOSIS — Z20822 Contact with and (suspected) exposure to covid-19: Secondary | ICD-10-CM | POA: Diagnosis not present

## 2020-02-18 DIAGNOSIS — Z03818 Encounter for observation for suspected exposure to other biological agents ruled out: Secondary | ICD-10-CM | POA: Diagnosis not present

## 2020-03-03 ENCOUNTER — Ambulatory Visit: Payer: 59 | Admitting: Cardiovascular Disease

## 2020-03-03 NOTE — Progress Notes (Signed)
Date:  03/05/2020   ID:  Wendy Kelly, DOB 13-May-1956, MRN [id number]  Patient Location:  [place] [address]   Provider location:   [facility], [place] office  PCP:  Valerie Roys, DO  Cardiologist:  Patsy Baltimore   Chief Complaint  Patient presents with  . Other    12 month follow up. Patient has been out of Nitro for a few years - would like to know if she needs it. meds reviewed verbally with patient.      History of Present Illness:    Wendy Kelly is a 64 y.o. 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, completed therapy She presents for routine followup of her chest pain and ascending aorta dilation  Scheduled for chest CTA today  Doing well,  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  Long standing hx oflow blood pressure: When 90 systolic , takes fluids and salt load No near syncope  covid 09/2019: had infusion Mom and husband had it as well Had antibody infusion at [place] blood pressure was lower following infusion  Continues to take care of 67 year old mother, very stressful  Denies significant chest pain  Does not want covid shot, Prior flu shot, 2 weeks late had cva sx Was told by physician never to have vaccines  EKG personally reviewed by myself on todays visit Shows NSR rate 66 bpm, no ST or T wave changes  Other past medical history  reviewed CT chest 02/05/2019 Stable dilatation of the ascending thoracic aorta with a maximum transverse diameter 4.8 x 4.8 cm. No evident dissection. 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  Treated with Xarelto  CTA chest scheduled 12/2017 PET scan with CT November 2018: ascending aorta dilation not measured PET scan with CT May 2019 : ascending aorta 4.3 cm, though significant artifact from her port  Ascending thoracic aortic aneurysm now measuring 48 mm compared to 46 mm in 2015.   Last CT chest December 2015, aorta 4.6 cm at that time No change compared to CT scan August 2011, at that time 4.6 x 4.8 cm  Son diagnosed with dilated ascending aorta 4.2 cm, he is out of state  CT scan in November 2013. 4.7 cm ascending aorta at that time    Past Medical History:  Diagnosis Date  . AA (aortic aneurysm) ([place])    a. 04/2011 Echo: Ao Root: 4.1cm, Asc Ao 4.7cm.  . Anxiety   . AV block, 2nd degree    a. 05/2005 - s/p MDT Adapta ADDR01 Dual Chamber PPM  . Bicuspid aortic valve   .  Cancer ([place])    Throat  . Chest pain    a. 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  Marland Kitchen Deep vein thrombosis (DVT) of left lower extremity ([place]) 10/03/2017  . Dehydration 08/11/2017  . Depression   . Expressive aphasia    a. ongoing since 04/2011 - seen by neurology - ? TIA vs. Migraine  . Facial numbness    a. ongoing since 04/2011 - seen by neurology - ? TIA vs. Migraine  . GERD (gastroesophageal reflux disease)   . Low blood pressure   . Personal history of chemotherapy 2018-2019   Throat  . Personal history of radiation therapy 2018-2019   Throat  . Presence of  permanent cardiac pacemaker   . Shingles    pt had on right side of head and his neck  . 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 Syncope    a. 04/2011 Echo: EF 55-65%, No RWMA, Gr 1 DD.  Marland Kitchen Thyroid disease    Past Surgical History:  Procedure Laterality Date  . ABDOMINAL WALL MESH  REMOVAL    . bladder tack    . cataract Bilateral 2004  . CHOLECYSTECTOMY  2000  . COLONOSCOPY WITH PROPOFOL N/A 06/26/2019   Procedure: COLONOSCOPY WITH PROPOFOL;  Surgeon: Lucilla Lame, MD;  Location: [facility] ENDOSCOPY;  Service: Endoscopy;  Laterality: N/A;  . DILATION AND CURETTAGE OF UTERUS    . DIRECT LARYNGOSCOPY Right 06/16/2017   Procedure: MICRO DIRECT LARYNGOSCOPY WITH BIOPSY OF RIGHT BASE OF TONGUE;  Surgeon: Carloyn Manner, MD;  Location: [facility] ORS;  Service: ENT;  Laterality: Right;  . EP IMPLANTABLE DEVICE N/A 06/04/2015   Procedure: PPM Generator Changeout;  Surgeon: Deboraha Sprang, MD;  Location: [place] [facility] CV LAB;  Service: Cardiovascular;  Laterality: N/A;  . ESOPHAGOGASTRODUODENOSCOPY (EGD) WITH PROPOFOL N/A 12/18/2015   Procedure: ESOPHAGOGASTRODUODENOSCOPY (EGD) WITH gastric biopsy and dilation;  Surgeon: Lucilla Lame, MD;  Location: [place];  Service: Endoscopy;  Laterality: N/A;  . EYE SURGERY    . INSERT / REPLACE / REMOVE PACEMAKER    . IR FLUORO GUIDE PORT INSERTION RIGHT  06/22/2017  . IR REMOVAL TUN ACCESS W/ PORT W/O FL MOD SED  04/14/2018  . MOHS SURGERY  02/2018  . PACEMAKER INSERTION  2006   Medtronic Adapta ADDR01  . TONSILLECTOMY    . TONSILLECTOMY    . VAGINAL HYSTERECTOMY  2013   mad/cope      Allergies:   Cefdinir, Cephalosporins, Dairy aid [lactase], Flagyl [metronidazole], Lactose intolerance (gi), Penicillins, Phenylephrine-guaifenesin, Sulfa antibiotics, Sulfonamide derivatives, Tape, Dexilant [dexlansoprazole], Entex lq [phenylephrine-guaifenesin], Other, Oxycodone, Succinylcholine, Vancomycin, Clarithromycin, Tetracycline,  Codeine, Guaifenesin & derivatives, Lactobacillus, Naproxen sodium, Pseudoephedrine, Tetracyclines & related, and Wheat bran   Social History   Tobacco Use  . Smoking status: Never Smoker  . Smokeless tobacco: Never Used  . Tobacco comment: tobacco use -no  Vaping Use  . Vaping Use: Never used  Substance Use Topics  . Alcohol use: No  . Drug use: No     Current Outpatient Medications on File Prior to Visit  Medication Sig Dispense Refill  . aspirin 81 MG tablet Take 81 mg by mouth daily.    Marland Kitchen b complex vitamins tablet Take 2 tablets by mouth daily.     Marland Kitchen BLACK ELDERBERRY,BERRY-FLOWER, PO Take 2 capsules by mouth daily.     . Calcium Carb-Cholecalciferol (CALCIUM 500 + D3 PO) Take 2 Doses by mouth daily.     Marland Kitchen CRANBERRY SOFT  PO Take 2 each by mouth daily.     . Digestive Enzymes (DIGESTIVE ENZYME PO) Take 1-2 capsules by mouth daily.    Marland Kitchen ipratropium (ATROVENT) 0.03 % nasal spray Place 2 sprays into both nostrils every 12 (twelve) hours.    Marland Kitchen levothyroxine (SYNTHROID) 75 MCG tablet Take 1 tablet (75 mcg total) by mouth daily. BRAND NAME SYNTHROID NECESSARY 90 tablet 3  . montelukast (SINGULAIR) 10 MG tablet Take 10 mg by mouth at bedtime.     Marland Kitchen PRESCRIPTION MEDICATION Place 1 tablet vaginally 3 (three) times a week. E3 1mg  and Testosterone 1mg  compound     Current Facility-Administered Medications on File Prior to Visit  Medication Dose Route Frequency Provider Last Rate Last Admin  . 0.9 %  sodium chloride infusion   Intravenous Once Sindy Guadeloupe, MD         Family Hx: The patient's family history includes Atrial fibrillation in her mother; Breast cancer (age of onset: 39) in her maternal aunt; COPD in her maternal grandmother and mother; Stroke in her father. There is no history of Ovarian cancer or Diabetes.  ROS:   Please see the history of present illness.    Review of Systems  Constitutional: Negative.   HENT: Negative.   Respiratory: Negative.   Cardiovascular:  Negative.   Gastrointestinal: Negative.   Musculoskeletal: Negative.   Neurological: Negative.   Psychiatric/Behavioral: Negative.   All other systems reviewed and are negative.    Labs/Other Tests and Data Reviewed:    Recent Labs: 05/21/2019: ALT 22; BUN 18; Creatinine, Ser 0.87; Potassium 3.5; Sodium 140 12/14/2019: Hemoglobin 12.6; Platelets 127 01/11/2020: TSH 2.340   Recent Lipid Panel Lab Results  Component Value Date/Time   CHOL 155 04/26/2019 08:33 AM   TRIG 35 04/26/2019 08:33 AM   HDL 67 04/26/2019 08:33 AM   CHOLHDL 2.8 06/30/2014 05:47 PM   LDLCALC 80 04/26/2019 08:33 AM    Wt Readings from Last 3 Encounters:  03/05/20 148 lb (67.1 kg)  01/11/20 144 lb 12.8 oz (65.7 kg)  12/04/19 147 lb 4 oz (66.8 kg)     Exam:    Vital Signs: Vital signs may also be detailed in the HPI BP 100/66 (BP Location: Left Arm, Patient Position: Sitting, Cuff Size: Normal)   Pulse 66   Ht 5\' 3"  (1.6 m)   Wt 148 lb (67.1 kg)   SpO2 97%   BMI 26.22 kg/m   Wt Readings from Last 3 Encounters:  03/05/20 148 lb (67.1 kg)  01/11/20 144 lb 12.8 oz (65.7 kg)  12/04/19 147 lb 4 oz (66.8 kg)   Temp Readings from Last 3 Encounters:  01/11/20 98.4 F (36.9 C) (Oral)  12/04/19 98.6 F (37 C)  11/12/19 98 F (36.7 C)   BP Readings from Last 3 Encounters:  03/05/20 100/66  01/11/20 102/64  12/04/19 119/68   Pulse Readings from Last 3 Encounters:  03/05/20 66  01/11/20 69  12/04/19 77     Constitutional:  oriented to person, place, and time. No distress.  HENT:  Head: Grossly normal Eyes:  no discharge. No scleral icterus.  Neck: No JVD, no carotid bruits  Cardiovascular: Regular rate and rhythm, no murmurs appreciated Pulmonary/Chest: Clear to auscultation bilaterally, no wheezes or rails Abdominal: Soft.  no distension.  no tenderness.  Musculoskeletal: Normal range of motion Neurological:  normal muscle tone. Coordination normal. 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

## 2020-03-05 ENCOUNTER — Encounter: Payer: Self-pay | Admitting: Cardiovascular Disease

## 2020-03-05 ENCOUNTER — Encounter: Payer: Self-pay | Admitting: Cardiothoracic Surgery

## 2020-03-05 ENCOUNTER — Ambulatory Visit: Payer: PPO | Admitting: Cardiovascular Disease

## 2020-03-05 ENCOUNTER — Ambulatory Visit
Admission: RE | Admit: 2020-03-05 | Discharge: 2020-03-05 | Disposition: A | Discharge status: Home / Self Care | Payer: PPO | Source: Ambulatory Visit | Attending: Cardiothoracic Surgery | Admitting: Cardiothoracic Surgery

## 2020-03-05 ENCOUNTER — Other Ambulatory Visit: Payer: Self-pay

## 2020-03-05 ENCOUNTER — Ambulatory Visit: Payer: PPO | Admitting: Cardiothoracic Surgery

## 2020-03-05 VITALS — Temp 97.2°F | Resp 16 | Ht 63.0 in | Wt 148.0 lb

## 2020-03-05 VITALS — BP 100/66 | HR 66 | Ht 63.0 in | Wt 148.0 lb

## 2020-03-05 DIAGNOSIS — M314 Aortic arch syndrome [Takayasu]: Secondary | ICD-10-CM | POA: Diagnosis not present

## 2020-03-05 DIAGNOSIS — I441 Atrioventricular block, second degree: Secondary | ICD-10-CM | POA: Diagnosis not present

## 2020-03-05 DIAGNOSIS — I951 Orthostatic hypotension: Secondary | ICD-10-CM | POA: Diagnosis not present

## 2020-03-05 DIAGNOSIS — I712 Thoracic aortic aneurysm, without rupture, unspecified: Secondary | ICD-10-CM

## 2020-03-05 DIAGNOSIS — I480 Paroxysmal atrial fibrillation: Secondary | ICD-10-CM | POA: Diagnosis not present

## 2020-03-05 DIAGNOSIS — I7121 Aneurysm of the ascending aorta, without rupture: Secondary | ICD-10-CM

## 2020-03-05 DIAGNOSIS — Z950 Presence of cardiac pacemaker: Secondary | ICD-10-CM

## 2020-03-05 MED ORDER — IOPAMIDOL (ISOVUE-370) INJECTION 76%
75.0000 mL | Freq: Once | INTRAVENOUS | Status: AC | PRN
  Administered 2020-03-05: 75 mL via INTRAVENOUS

## 2020-03-05 NOTE — Progress Notes (Signed)
PCP is Valerie Roys, DO Referring Provider is Minna Merritts, MD  Chief Complaint  Patient presents with  . Thoracic Aortic Aneurysm    1 year f/u with Chest CTA    HPI: Annual visit with CTA surveillance scan for known asymptomatic fusiform ascending aneurysm stable at 4.8 cm since 2012.  She does not have hypertension.  She has a current non-smoker.  The patient is status post Partial laryngectomy for cancer about 2 years ago.  Followed by oncology and ENT  No chest pains palpitations or edema.   the CTA performed today shows stable fusiform ascending aneurysm without mural thrombus or ulceration.  Diameter stable at 4.8 cm.  Past Medical History:  Diagnosis Date  . AA (aortic aneurysm) ([place])    a. 04/2011 Echo: Ao Root: 4.1cm, Asc Ao 4.7cm.  . Anxiety   . AV block, 2nd degree    a. 05/2005 - s/p MDT Adapta ADDR01 Dual Chamber PPM  . Bicuspid aortic valve   . Cancer ([place])    Throat  . Chest pain    a. 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  Marland Kitchen Deep vein thrombosis (DVT) of left lower extremity ([place]) 10/03/2017  . Dehydration 08/11/2017  . Depression   . Expressive aphasia    a. ongoing since 04/2011 - seen by neurology - ? TIA vs. Migraine  . Facial numbness    a. ongoing since 04/2011 - seen by neurology - ? TIA vs. Migraine  . GERD (gastroesophageal reflux disease)   . Low blood pressure   . Personal history of chemotherapy 2018-2019   Throat  . Personal history of radiation therapy 2018-2019   Throat  . Presence of permanent cardiac pacemaker   . Shingles    pt had on right side of head and his neck  . 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 Syncope    a. 04/2011 Echo: EF 55-65%, No RWMA, Gr 1 DD.  Marland Kitchen Thyroid disease     Past Surgical History:  Procedure Laterality Date  . ABDOMINAL WALL MESH  REMOVAL    . bladder tack    . cataract Bilateral 2004  . CHOLECYSTECTOMY  2000  . COLONOSCOPY WITH PROPOFOL N/A 06/26/2019   Procedure: COLONOSCOPY  WITH PROPOFOL;  Surgeon: Lucilla Lame, MD;  Location: [facility] ENDOSCOPY;  Service: Endoscopy;  Laterality: N/A;  . DILATION AND CURETTAGE OF UTERUS    . DIRECT LARYNGOSCOPY Right 06/16/2017   Procedure: MICRO DIRECT LARYNGOSCOPY WITH BIOPSY OF RIGHT BASE OF TONGUE;  Surgeon: Carloyn Manner, MD;  Location: [facility] ORS;  Service: ENT;  Laterality: Right;  . EP IMPLANTABLE DEVICE N/A 06/04/2015   Procedure: PPM Generator Changeout;  Surgeon: Deboraha Sprang, MD;  Location: [place] CV LAB;  Service: Cardiovascular;  Laterality: N/A;  . ESOPHAGOGASTRODUODENOSCOPY (EGD) WITH PROPOFOL N/A 12/18/2015   Procedure: ESOPHAGOGASTRODUODENOSCOPY (EGD) WITH gastric biopsy and dilation;  Surgeon: Lucilla Lame, MD;  Location: [place];  Service: Endoscopy;  Laterality: N/A;  . EYE SURGERY    . INSERT / REPLACE / REMOVE PACEMAKER    . IR FLUORO GUIDE PORT INSERTION RIGHT  06/22/2017  . IR REMOVAL TUN ACCESS W/ PORT W/O FL MOD SED  04/14/2018  . MOHS SURGERY  02/2018  . PACEMAKER INSERTION  2006   Medtronic Adapta ADDR01  . TONSILLECTOMY    . TONSILLECTOMY    . VAGINAL HYSTERECTOMY  2013   mad/cope    Family History  Problem Relation Age  of Onset  . COPD Mother        alive @ 106  . Atrial fibrillation Mother   . Stroke Father        died @ 66  . Breast cancer Maternal Aunt 70  . COPD Maternal Grandmother   . Ovarian cancer Neg Hx   . Diabetes Neg Hx     Social History Social History   Tobacco Use  . Smoking status: Never Smoker  . Smokeless tobacco: Never Used  . Tobacco comment: tobacco use -no  Vaping Use  . Vaping Use: Never used  Substance Use Topics  . Alcohol use: No  . Drug use: No    Current Outpatient Medications  Medication Sig Dispense Refill  . fexofenadine (ALLEGRA) 180 MG tablet Take 180 mg by mouth daily.    Marland Kitchen aspirin 81 MG tablet Take 81 mg by mouth daily.    Marland Kitchen b complex vitamins tablet Take 2 tablets by mouth daily.     Marland Kitchen BLACK ELDERBERRY,BERRY-FLOWER, PO Take  2 capsules by mouth daily.     . Calcium Carb-Cholecalciferol (CALCIUM 500 + D3 PO) Take 2 Doses by mouth daily.     Marland Kitchen CRANBERRY SOFT PO Take 2 each by mouth daily.     . Digestive Enzymes (DIGESTIVE ENZYME PO) Take 1-2 capsules by mouth daily.    Marland Kitchen ipratropium (ATROVENT) 0.03 % nasal spray Place 2 sprays into both nostrils every 12 (twelve) hours.    Marland Kitchen levothyroxine (SYNTHROID) 75 MCG tablet Take 1 tablet (75 mcg total) by mouth daily. BRAND NAME SYNTHROID NECESSARY 90 tablet 3  . montelukast (SINGULAIR) 10 MG tablet Take 10 mg by mouth at bedtime.     Marland Kitchen PRESCRIPTION MEDICATION Place 1 tablet vaginally 3 (three) times a week. E3 1mg  and Testosterone 1mg  compound     No current facility-administered medications for this visit.   Facility-Administered Medications Ordered in Other Visits  Medication Dose Route Frequency Provider Last Rate Last Admin  . 0.9 %  sodium chloride infusion   Intravenous Once Sindy Guadeloupe, MD        Allergies  Allergen Reactions  . Cefdinir Swelling    Swelling in mouth   . Cephalosporins Swelling  . Dairy Aid [Lactase] Swelling    Milk and milk products cause swelling and tingling of tongue  . Flagyl [Metronidazole] Swelling    Mouth and throat and ears go red and burn  . Lactose Intolerance (Gi) Other (See Comments)    GI upset.  Eats only gluten free  . Penicillins Rash    Rash/hives  . Phenylephrine-Guaifenesin Anaphylaxis    Night terrors  . Sulfa Antibiotics Rash and Hives    Other reaction(s): Unknown   . Sulfonamide Derivatives Rash    Rash/itching.  From head to her toes, the itching was horrible. Penicillin is the worst  . Tape Rash    Adhesives all cause a problem severely.  It doesn't matter if it is paper tape or not.  Marland Kitchen Dexilant [Dexlansoprazole]     Pressure in head like head is in a vice and being squeezed  . Entex Lq [Phenylephrine-Guaifenesin]     Night terrors  . Other Other (See Comments)    Flu vaccine:  Weakness, dizziness,  tia type symptoms.    . Oxycodone Nausea And Vomiting    And itching  . Succinylcholine Other (See Comments)    Trouble waking up  . Vancomycin Rash  . Clarithromycin Itching and Other (See Comments)  .  Tetracycline Other (See Comments)    Unkown  . Codeine Nausea And Vomiting  . Guaifenesin & Derivatives Other (See Comments)    Night terrors  . Lactobacillus Itching  . Naproxen Sodium     Itching/rash  . Pseudoephedrine Rash  . Tetracyclines & Related Other (See Comments)    Was taking flagyl at the same time; unsure which caused the swelling  Swelling of throat. Pharmacist states that he believes it was the flagyl and not the tetracycline  . Wheat Bran Other (See Comments)    Upset stomach. 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 Voice lifting heavy weights  Temp (!) 97.2 F (36.2 C)   Resp 16   Ht 5\' 3"  (1.6 m)   Wt 148 lb (67.1 kg)   SpO2 98% Comment: RA  BMI 26.22 kg/m  Physical Exam      Exam    General- alert and comfortable    Neck- no JVD, no cervical adenopathy palpable, no carotid bruit   Lungs- clear without rales, wheezes   Cor- regular rate and rhythm, n 1/6 aortic sclerosis murmur ,no  gallop   Abdomen- soft, non-tender   Extremities - warm, non-tender, minimal edema   Neuro- oriented, appropriate, no focal weakness   Diagnostic Tests: CT images personally reviewed is noted above and results discussed with patient.  Impression: Stable 4.8 cm centimeter fusiform asymptomatic aneurysm with a tricuspid aortic valve and recent history of surgery and radiation for subglottic cancer.  The aneurysm has remained stable on CT scans for 9 years.  Plan: Continue annual surveillance CT scan.  Report any problems with new onset chest pain.   Len Childs, MD [facility] [phone]

## 2020-03-05 NOTE — Patient Instructions (Signed)
Medication Instructions:  No changes  If you need a refill on your cardiac medications before your next appointment, please call your pharmacy.    Lab work: No new labs needed   If you have labs (blood work) drawn today and your tests are completely normal, you will receive your results only by: . MyChart Message (if you have MyChart) OR . A paper copy in the mail If you have any lab test that is abnormal or we need to change your treatment, we will call you to review the results.   Testing/Procedures: No new testing needed   Follow-Up: At [facility], you and your health needs are our priority.  As part of our continuing mission to provide you with exceptional heart care, we have created designated Provider Care Teams.  These Care Teams include your primary Cardiologist (physician) and Advanced Practice Providers (APPs -  Physician Assistants and Nurse Practitioners) who all work together to provide you with the care you need, when you need it.  . You will need a follow up appointment in 12 months  . Providers on your designated Care Team:   . Christopher Berge, NP . Ryan Dunn, PA-C . Jacquelyn Visser, PA-C  Any Other Special Instructions Will Be Listed Below (If Applicable).  COVID-19 Vaccine Information can be found at: [email or web address] For questions related to vaccine distribution or appointments, please email [email or web address] or call 336-890-1188.     

## 2020-03-07 DIAGNOSIS — Z85810 Personal history of malignant neoplasm of tongue: Secondary | ICD-10-CM | POA: Diagnosis not present

## 2020-03-07 DIAGNOSIS — K219 Gastro-esophageal reflux disease without esophagitis: Secondary | ICD-10-CM | POA: Diagnosis not present

## 2020-03-07 DIAGNOSIS — R1314 Dysphagia, pharyngoesophageal phase: Secondary | ICD-10-CM | POA: Diagnosis not present

## 2020-03-10 ENCOUNTER — Other Ambulatory Visit: Payer: Self-pay | Admitting: Otolaryngology

## 2020-03-10 DIAGNOSIS — R1311 Dysphagia, oral phase: Secondary | ICD-10-CM

## 2020-03-11 ENCOUNTER — Ambulatory Visit (INDEPENDENT_AMBULATORY_CARE_PROVIDER_SITE_OTHER): Payer: PPO | Admitting: *Deleted

## 2020-03-11 DIAGNOSIS — R55 Syncope and collapse: Secondary | ICD-10-CM

## 2020-03-12 LAB — CUP PACEART REMOTE DEVICE CHECK
Battery Impedance: 212 Ohm
Battery Remaining Longevity: 133 mo
Battery Voltage: 2.78 V
Brady Statistic AP VP Percent: 0 %
Brady Statistic AP VS Percent: 16 %
Brady Statistic AS VP Percent: 0 %
Brady Statistic AS VS Percent: 84 %
Date Time Interrogation Session: 20210831191438
Implantable Lead Implant Date: 20061128
Implantable Lead Implant Date: 20061128
Implantable Lead Location: 753859
Implantable Lead Location: 753860
Implantable Lead Model: 4092
Implantable Lead Model: 4592
Implantable Pulse Generator Implant Date: 20161123
Lead Channel Impedance Value: 547 Ohm
Lead Channel Impedance Value: 760 Ohm
Lead Channel Pacing Threshold Amplitude: 0.5 V
Lead Channel Pacing Threshold Amplitude: 1 V
Lead Channel Pacing Threshold Pulse Width: 0.4 ms
Lead Channel Pacing Threshold Pulse Width: 0.4 ms
Lead Channel Setting Pacing Amplitude: 2 V
Lead Channel Setting Pacing Amplitude: 2.5 V
Lead Channel Setting Pacing Pulse Width: 0.4 ms
Lead Channel Setting Sensing Sensitivity: 4 mV

## 2020-03-12 NOTE — Progress Notes (Signed)
Remote pacemaker transmission.   

## 2020-03-13 ENCOUNTER — Telehealth: Payer: Self-pay | Admitting: Family Medicine

## 2020-03-13 NOTE — Telephone Encounter (Signed)
Copied from [place] [id number]. Topic: General - Other >> Mar 13, 2020 11:42 AM Rainey Pines A wrote: Patient has low grade fever and headache last night. Patient stated that fever broke late last night. Patient also has some fatigue as well. Patients grandson went to er last night and got covid test and she is still awaiting his results. She is currently in line for a covid test right now and wants to know what D.r Wynetta Emery would advise her to do. Please advise

## 2020-03-13 NOTE — Telephone Encounter (Signed)
I advise her to get covid tested. If she's still feeling sick after she gets her rsults we can see her virtually

## 2020-03-13 NOTE — Telephone Encounter (Signed)
Called and spoke with patient. She states that she had to get out of line today because she had to use the restroom. States she is actually feeling better right now. She states that her grandsons test will come back tomorrow. She states that if his test is positive, then she will go get tested. Advised patient to please get tested if symptoms continue or call and schedule a virtual appointment with [place] to be seen.

## 2020-03-31 ENCOUNTER — Ambulatory Visit: Payer: PPO

## 2020-04-18 ENCOUNTER — Ambulatory Visit
Admission: RE | Admit: 2020-04-18 | Discharge: 2020-04-18 | Disposition: A | Discharge status: Home / Self Care | Payer: PPO | Source: Ambulatory Visit | Attending: Otolaryngology | Admitting: Otolaryngology

## 2020-04-18 ENCOUNTER — Other Ambulatory Visit: Payer: Self-pay

## 2020-04-18 DIAGNOSIS — R1310 Dysphagia, unspecified: Secondary | ICD-10-CM

## 2020-04-18 DIAGNOSIS — Z0389 Encounter for observation for other suspected diseases and conditions ruled out: Secondary | ICD-10-CM | POA: Diagnosis not present

## 2020-04-18 NOTE — Therapy (Signed)
[place], [place], [address] Phone: [phone]   Fax:     Modified Barium Swallow  Patient Details  Name: Wendy Kelly MRN: [id number] Date of Birth: 22-Feb-1956 No data recorded  Encounter Date: 04/18/2020   End of Session - 04/18/20 1328    Visit Number 1    Number of Visits 1    Authorization Type Medicare    Authorization Time Period 04/18/2020    SLP Start Time 48    SLP Stop Time  1325    SLP Time Calculation (min) 25 min    Activity Tolerance Patient tolerated treatment well           Past Medical History:  Diagnosis Date  . AA (aortic aneurysm) ([place])    a. 04/2011 Echo: Ao Root: 4.1cm, Asc Ao 4.7cm.  . Anxiety   . AV block, 2nd degree    a. 05/2005 - s/p MDT Adapta ADDR01 Dual Chamber PPM  . Bicuspid aortic valve   . Cancer ([place])    Throat  . Chest pain    a. 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  Marland Kitchen Deep vein thrombosis (DVT) of left lower extremity ([place]) 10/03/2017  . Dehydration 08/11/2017  . Depression   . Expressive aphasia    a. ongoing since 04/2011 - seen by neurology - ? TIA vs. Migraine  . Facial numbness    a. ongoing since 04/2011 - seen by neurology - ? TIA vs. Migraine  . GERD (gastroesophageal reflux disease)   . Low blood pressure   . Personal history of chemotherapy 2018-2019   Throat  . Personal history of radiation therapy 2018-2019   Throat  . Presence of permanent cardiac pacemaker   . Shingles    pt had on right side of head and his neck  . 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 Syncope    a. 04/2011 Echo: EF 55-65%, No RWMA, Gr 1 DD.  Marland Kitchen Thyroid disease     Past Surgical History:  Procedure Laterality Date  . ABDOMINAL WALL MESH  REMOVAL    . bladder tack    . cataract Bilateral 2004  . CHOLECYSTECTOMY  2000  . COLONOSCOPY WITH PROPOFOL N/A 06/26/2019   Procedure: COLONOSCOPY WITH PROPOFOL;  Surgeon: Lucilla Lame, MD;   Location[facility]COPY;  Service: Endoscopy;  Laterality: N/A;  . DILATION AND CURETTAGE OF UTERUS    . DIRECT LARYNGOSCOPY Right 06/16/2017   Procedure: MICRO DIRECT LARYNGOSCOPY WITH BIOPSY OF RIGHT BASE OF TONGUE;  Surgeon: Carloyn Manner, MD;  Location: [facility] ORS;  Service: ENT;  Laterality: Right;  . EP IMPLANTABLE DEVICE N/A 06/04/2015   Procedure: PPM Generator Changeout;  Surgeon: Deboraha Sprang, MD;  Location: [facility] CV LAB;  Service: Cardiovascular;  Laterality: N/A;  . ESOPHAGOGASTRODUODENOSCOPY (EGD) WITH PROPOFOL N/A 12/18/2015   Procedure: ESOPHAGOGASTRODUODENOSCOPY (EGD) WITH gastric biopsy and dilation;  Surgeon: Lucilla Lame, MD;  Location: [facility];  Service: Endoscopy;  Laterality: N/A;  . EYE SURGERY    . INSERT / REPLACE / REMOVE PACEMAKER    . IR FLUORO GUIDE PORT INSERTION RIGHT  06/22/2017  . IR REMOVAL TUN ACCESS W/ PORT W/O FL MOD SED  04/14/2018  . MOHS SURGERY  02/2018  . PACEMAKER INSERTION  2006   Medtronic Adapta ADDR01  . TONSILLECTOMY    . TONSILLECTOMY    . VAGINAL HYSTERECTOMY  2013   mad/cope  There were no vitals filed for this visit.        General - 04/18/20 1327      General Information   Date of Onset 04/16/20    Type of Study MBS-Modified Barium Swallow Study    Previous Swallow Assessment none in chart    Diet Prior to this Study Regular;Thin liquids    Temperature Spikes Noted No    Respiratory Status Room air    History of Recent Intubation No    Behavior/Cognition Alert;Cooperative;Pleasant mood    Oral Cavity Assessment Within Functional Limits    Oral Care Completed by SLP No    Oral Cavity - Dentition Adequate natural dentition    Vision Functional for self feeding    Self-Feeding Abilities Able to feed self    Patient Positioning Upright in chair    Baseline Vocal Quality Normal    Volitional Cough Strong    Volitional Swallow Able to elicit    Anatomy Within functional limits    Pharyngeal Secretions  Not observed secondary MBS              Oral Preparation/Oral Phase - 04/18/20 1327      Oral Preparation/Oral Phase   Oral Phase Within functional limits            Pharyngeal Phase - 04/18/20 1327      Pharyngeal Phase   Pharyngeal Phase Within functional limits      Electrical Stimulation - Pharyngeal Phase   Was Electrical Stimulation Used No            Cricopharyngeal Phase - 04/18/20 1328      Cervical Esophageal Phase   Cervical Esophageal Phase Within functional limits                    Plan - 04/18/20 1329    Clinical Impression Statement Pt presents with adequate oropharyngeal abilities when consuming solids, puree, whole barium tablet and thin liquids via cup. Pt's oral and pharyngeal phase were within normal limits. No esophageal stasis was observed. Education provided to pt and she appeared very relieved.    Consulted and Agree with Plan of Care Patient           Patient will benefit from skilled therapeutic intervention in order to improve the following deficits and impairments:   Dysphagia, unspecified type - Plan: DG SWALLOW FUNC OP MEDICARE SPEECH PATH, DG SWALLOW FUNC OP MEDICARE SPEECH PATH     Recommendations/Treatment - 04/18/20 1328      Swallow Evaluation Recommendations   SLP Diet Recommendations Thin;Age appropriate regular    Liquid Administration via Cup;Straw    Medication Administration Whole meds with liquid    Supervision Patient able to self feed    Compensations Minimize environmental distractions;Slow rate;Small sips/bites    Postural Changes Seated upright at 90 degrees;Remain upright for at least 30 minutes after feeds/meals            Prognosis - 04/18/20 1328      Individuals Consulted   Consulted and Agree with Results and Recommendations Patient    Report Sent to  Referring physician           Problem List Patient Active Problem List   Diagnosis Date Noted  . 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 Genitourinary bleeding 12/04/2019  . COVID-19 virus infection 09/11/2019  . Encounter for screening colonoscopy   . Polyp of descending colon   . Hypothyroidism 06/27/2018  .  Dehydration 08/11/2017  . 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 Goals of care, counseling/discussion 06/14/2017  . Atrial fibrillation ([place]) 04/19/2017  . Problems with swallowing and mastication   . TIA (transient ischemic attack) 05/02/2015  . Ascending aortic aneurysm ([place]) 10/23/2012  . AV block, 2nd degree   . Expressive aphasia 06/11/2011  . Orthostatic hypotension 06/11/2011  . 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onic 08/20/2009   Savanna Dooley B. Rutherford Nail M.S., CCC-SLP, [place] [facility]ologist [facility] Office [phone]  Stormy Fabian 04/18/2020, 2:57 PM  [facility] [place], [place], [address] Phone: [phone]   Fax:     Name: Wendy Kelly MRN: [id number] Date of Birth: 12-Oct-1955

## 2020-04-21 ENCOUNTER — Other Ambulatory Visit: Payer: Self-pay | Admitting: Family Medicine

## 2020-04-21 DIAGNOSIS — Z1231 Encounter for screening mammogram for malignant neoplasm of breast: Secondary | ICD-10-CM

## 2020-04-22 ENCOUNTER — Other Ambulatory Visit: Payer: Self-pay | Admitting: Family Medicine

## 2020-04-22 NOTE — Telephone Encounter (Signed)
Requested Prescriptions  Pending Prescriptions Disp Refills   SYNTHROID 75 MCG tablet [Pharmacy Med Name: SYNTHROID 0.075MG  (75MCG)TABLETS] 90 tablet 3    Sig: TAKE 1 TABLET BY MOUTH EVERY DAY     Endocrinology:  Hypothyroid Agents Failed - 04/22/2020 10:27 AM      Failed - TSH needs to be rechecked within 3 months after an abnormal result. Refill until TSH is due.      Passed - TSH in normal range and within 360 days    TSH  Date Value Ref Range Status  01/11/2020 2.340 0.450 - 4.500 uIU/mL Final         Passed - Valid encounter within last 12 months    Recent Outpatient Visits          3 months ago Acquired hypothyroidism   [facility] [place], [place], DO   4 months ago Genitourinary bleeding   [facility] Eulogio Bear, NP   5 months ago Acute non-recurrent maxillary sinusitis   [facility] [place], [place], DO   6 months ago COVID-19   [employer], [place], DO   7 months ago COVID-19   [employer], [place], DO      Future Appointments            In 1 week Wynetta Emery, Barb Merino, DO [employer], [facility]

## 2020-04-29 ENCOUNTER — Other Ambulatory Visit: Payer: Self-pay

## 2020-04-29 ENCOUNTER — Encounter: Payer: Self-pay | Admitting: Family Medicine

## 2020-04-29 ENCOUNTER — Ambulatory Visit (INDEPENDENT_AMBULATORY_CARE_PROVIDER_SITE_OTHER): Payer: PPO | Admitting: Family Medicine

## 2020-04-29 VITALS — BP 121/83 | HR 76 | Temp 98.3°F | Ht 62.25 in | Wt 148.2 lb

## 2020-04-29 DIAGNOSIS — E039 Hypothyroidism, unspecified: Secondary | ICD-10-CM

## 2020-04-29 DIAGNOSIS — R1030 Lower abdominal pain, unspecified: Secondary | ICD-10-CM | POA: Diagnosis not present

## 2020-04-29 DIAGNOSIS — D692 Other nonthrombocytopenic purpura: Secondary | ICD-10-CM | POA: Diagnosis not present

## 2020-04-29 DIAGNOSIS — Z114 Encounter for screening for human immunodeficiency virus [HIV]: Secondary | ICD-10-CM

## 2020-04-29 DIAGNOSIS — Z131 Encounter for screening for diabetes mellitus: Secondary | ICD-10-CM | POA: Diagnosis not present

## 2020-04-29 DIAGNOSIS — Z136 Encounter for screening for cardiovascular disorders: Secondary | ICD-10-CM

## 2020-04-29 DIAGNOSIS — Z923 Personal history of irradiation: Secondary | ICD-10-CM | POA: Diagnosis not present

## 2020-04-29 DIAGNOSIS — I7121 Aneurysm of the ascending aorta, without rupture: Secondary | ICD-10-CM

## 2020-04-29 DIAGNOSIS — Z13820 Encounter for screening for osteoporosis: Secondary | ICD-10-CM

## 2020-04-29 DIAGNOSIS — Z0000 Encounter for general adult medical examination without abnormal findings: Secondary | ICD-10-CM

## 2020-04-29 DIAGNOSIS — I712 Thoracic aortic aneurysm, without rupture: Secondary | ICD-10-CM | POA: Diagnosis not present

## 2020-04-29 DIAGNOSIS — Z1231 Encounter for screening mammogram for malignant neoplasm of breast: Secondary | ICD-10-CM

## 2020-04-29 LAB — URINALYSIS, ROUTINE W REFLEX MICROSCOPIC
Bilirubin, UA: NEGATIVE
Glucose, UA: NEGATIVE
Ketones, UA: NEGATIVE
Leukocytes,UA: NEGATIVE
Nitrite, UA: NEGATIVE
Protein,UA: NEGATIVE
RBC, UA: NEGATIVE
Specific Gravity, UA: 1.015 (ref 1.005–1.030)
Urobilinogen, Ur: 0.2 mg/dL (ref 0.2–1.0)
pH, UA: 6.5 (ref 5.0–7.5)

## 2020-04-29 NOTE — Progress Notes (Signed)
BP 121/83   Pulse 76   Temp 98.3 F (36.8 C) (Oral)   Ht 5' 2.25" (1.581 m)   Wt 148 lb 3.2 oz (67.2 kg)   SpO2 99%   BMI 26.89 kg/m    Subjective:    Patient ID: Wendy Kelly, female    DOB: 05/02/1956, 64 y.o.   MRN: [id number]  HPI: Wendy Kelly is a 64 y.o. female presenting on 04/29/2020 for comprehensive medical examination. Current medical complaints include:  Has been having some abdominal pressure x 59months  HYPOTHYROIDISM Thyroid control status:controlled Satisfied with current treatment? yes Medication side effects: no Medication compliance: excellent compliance Recent dose adjustment:no Fatigue: no Cold intolerance: no Heat intolerance: no Weight gain: no Weight loss: no Constipation: no Diarrhea/loose stools: no Palpitations: no Lower extremity edema: no Anxiety/depressed mood: no  Menopausal Symptoms: yes- vaginal driness  Functional Status Survey: Is the patient deaf or have difficulty hearing?: No Does the patient have difficulty seeing, even when wearing glasses/contacts?: No Does the patient have difficulty concentrating, remembering, or making decisions?: No Does the patient have difficulty walking or climbing stairs?: No Does the patient have difficulty dressing or bathing?: No Does the patient have difficulty doing errands alone such as visiting a doctor's office or shopping?: No  Fall Risk  04/29/2020 04/26/2019 09/26/2018 04/20/2018 10/10/2017  Falls in the past year? 1 0 0 No No  Number falls in past yr: 0 0 - - -  Injury with Fall? 0 0 - - -  Risk for fall due to : No Fall Risks - - - -  Follow up Falls evaluation completed - Falls evaluation completed - -    Depression Screen Depression screen [facility] 2/9 04/29/2020 04/26/2019 04/20/2018 10/10/2017  Decreased Interest 0 0 0 0  Down, Depressed, Hopeless 0 0 0 0  PHQ - 2 Score 0 0 0 0  Altered sleeping 3 1 3  -  Tired, decreased energy 3 1 1  -  Change in appetite 0 0 0 -  Feeling bad or  failure about yourself  0 0 0 -  Trouble concentrating 0 0 0 -  Moving slowly or fidgety/restless 0 0 0 -  Suicidal thoughts 0 0 0 -  PHQ-9 Score 6 2 4  -  Difficult doing work/chores Not difficult at all Not difficult at all Not difficult at all -  Some recent data might be hidden   Advanced Directives Does patient have a HCPOA?    yes Does patient have a living will or MOST form?  yes  Past Medical History:  Past Medical History:  Diagnosis Date  . AA (aortic aneurysm) ([place])    a. 04/2011 Echo: Ao Root: 4.1cm, Asc Ao 4.7cm.  . Anxiety   . AV block, 2nd degree    a. 05/2005 - s/p MDT Adapta ADDR01 Dual Chamber PPM  . Bicuspid aortic valve   . Cancer ([place])    Throat  . Chest pain    a. 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  Marland Kitchen Deep vein thrombosis (DVT) of left lower extremity ([place]) 10/03/2017  . Dehydration 08/11/2017  . Depression   . Expressive aphasia    a. ongoing since 04/2011 - seen by neurology - ? TIA vs. Migraine  . Facial numbness    a. ongoing since 04/2011 - seen by neurology - ? TIA vs. Migraine  . GERD (gastroesophageal reflux disease)   . Low blood pressure   . Personal history of chemotherapy 2018-2019   Throat  . Personal history of  radiation therapy 2018-2019   Throat  . Presence of permanent cardiac pacemaker   . Shingles    pt had on right side of head and his neck  . 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 Syncope    a. 04/2011 Echo: EF 55-65%, No RWMA, Gr 1 DD.  Marland Kitchen Thyroid disease     Surgical History:  Past Surgical History:  Procedure Laterality Date  . ABDOMINAL WALL MESH  REMOVAL    . bladder tack    . cataract Bilateral 2004  . CHOLECYSTECTOMY  2000  . COLONOSCOPY WITH PROPOFOL N/A 06/26/2019   Procedure: COLONOSCOPY WITH PROPOFOL;  Surgeon: Lucilla Lame, MD;  Location: [facility] ENDOSCOPY;  Service: Endoscopy;  Laterality: N/A;  . DILATION AND CURETTAGE OF UTERUS    . DIRECT LARYNGOSCOPY Right 06/16/2017   Procedure: MICRO  DIRECT LARYNGOSCOPY WITH BIOPSY OF RIGHT BASE OF TONGUE;  Surgeon: Carloyn Manner, MD;  Location: [facility] ORS;  Service: ENT;  Laterality: Right;  . EP IMPLANTABLE DEVICE N/A 06/04/2015   Procedure: PPM Generator Changeout;  Surgeon: Deboraha Sprang, MD;  Location: [place] CV LAB;  Service: Cardiovascular;  Laterality: N/A;  . ESOPHAGOGASTRODUODENOSCOPY (EGD) WITH PROPOFOL N/A 12/18/2015   Procedure: ESOPHAGOGASTRODUODENOSCOPY (EGD) WITH gastric biopsy and dilation;  Surgeon: Lucilla Lame, MD;  Location: [place];  Service: Endoscopy;  Laterality: N/A;  . EYE SURGERY    . INSERT / REPLACE / REMOVE PACEMAKER    . IR FLUORO GUIDE PORT INSERTION RIGHT  06/22/2017  . IR REMOVAL TUN ACCESS W/ PORT W/O FL MOD SED  04/14/2018  . MOHS SURGERY  02/2018  . PACEMAKER INSERTION  2006   Medtronic Adapta ADDR01  . TONSILLECTOMY    . TONSILLECTOMY    . VAGINAL HYSTERECTOMY  2013   mad/cope    Medications:  Current Outpatient Medications on File Prior to Visit  Medication Sig  . aspirin 81 MG tablet Take 81 mg by mouth daily.  Marland Kitchen b complex vitamins tablet Take 2 tablets by mouth daily.   Marland Kitchen BLACK ELDERBERRY,BERRY-FLOWER, PO Take 2 capsules by mouth daily.   . Calcium Carb-Cholecalciferol (CALCIUM 500 + D3 PO) Take 2 Doses by mouth daily.   Marland Kitchen CRANBERRY SOFT PO Take 2 each by mouth daily.   . Digestive Enzymes (DIGESTIVE ENZYME PO) Take 1-2 capsules by mouth daily.  . fexofenadine (ALLEGRA) 180 MG tablet Take 180 mg by mouth daily.  . montelukast (SINGULAIR) 10 MG tablet Take 10 mg by mouth at bedtime.   Marland Kitchen PRESCRIPTION MEDICATION Place 1 tablet vaginally 3 (three) times a week. E3 1mg  and Testosterone 1mg  compound  . SYNTHROID 75 MCG tablet TAKE 1 TABLET BY MOUTH EVERY DAY   Current Facility-Administered Medications on File Prior to Visit  Medication  . 0.9 %  sodium chloride infusion    Allergies:  Allergies  Allergen Reactions  . Cefdinir Swelling    Swelling in mouth   .  Cephalosporins Swelling  . Dairy Aid [Lactase] Swelling    Milk and milk products cause swelling and tingling of tongue  . Flagyl [Metronidazole] Swelling    Mouth and throat and ears go red and burn  . Lactose Intolerance (Gi) Other (See Comments)    GI upset.  Eats only gluten free  . Penicillins Rash    Rash/hives  . Phenylephrine-Guaifenesin Anaphylaxis    Night terrors  . Sulfa Antibiotics Rash and Hives    Other reaction(s): Unknown   .  Sulfonamide Derivatives Rash    Rash/itching.  From head to her toes, the itching was horrible. Penicillin is the worst  . Tape Rash    Adhesives all cause a problem severely.  It doesn't matter if it is paper tape or not.  Marland Kitchen Dexilant [Dexlansoprazole]     Pressure in head like head is in a vice and being squeezed  . Entex Lq [Phenylephrine-Guaifenesin]     Night terrors  . Other Other (See Comments)    Flu vaccine:  Weakness, dizziness, tia type symptoms.    . Oxycodone Nausea And Vomiting    And itching  . Succinylcholine Other (See Comments)    Trouble waking up  . Vancomycin Rash  . Clarithromycin Itching and Other (See Comments)  . Tetracycline Other (See Comments)    Unkown  . Codeine Nausea And Vomiting  . Guaifenesin & Derivatives Other (See Comments)    Night terrors  . Lactobacillus Itching  . Naproxen Sodium     Itching/rash  . Pseudoephedrine Rash  . Tetracyclines & Related Other (See Comments)    Was taking flagyl at the same time; unsure which caused the swelling  Swelling of throat. Pharmacist states that he believes it was the flagyl and not the tetracycline  . Wheat Bran Other (See Comments)    Upset stomach. Now eats gluten free    Social History:  Social History   Socioeconomic History  . Marital status: Married    Spouse name: Not on file  . Number of children: Not on file  . Years of education: Not on file  . Highest education level: Not on file  Occupational History  . Not on file  Tobacco Use  .  Smoking status: Never Smoker  . Smokeless tobacco: Never Used  . Tobacco comment: tobacco use -no  Vaping Use  . Vaping Use: Never used  Substance and Sexual Activity  . Alcohol use: No  . Drug use: No  . Sexual activity: Yes    Birth control/protection: Surgical  Other Topics Concern  . Not on file  Social History Narrative   Lives in [place] with husband.  Works out regularly.  Works as a Geophysicist/field seismologist - owns own business.    Social Determinants of Health   Financial Resource Strain:   . Difficulty of Paying Living Expenses: Not on file  Food Insecurity:   . Worried About Charity fundraiser in the Last Year: Not on file  . Ran Out of Food in the Last Year: Not on file  Transportation Needs:   . Lack of Transportation (Medical): Not on file  . Lack of Transportation (Non-Medical): Not on file  Physical Activity:   . Days of Exercise per Week: Not on file  . Minutes of Exercise per Session: Not on file  Stress:   . Feeling of Stress : Not on file  Social Connections:   . Frequency of Communication with Friends and Family: Not on file  . Frequency of Social Gatherings with Friends and Family: Not on file  . Attends Religious Services: Not on file  . Active Member of Clubs or Organizations: Not on file  . Attends Archivist Meetings: Not on file  . Marital Status: Not on file  Intimate Partner Violence:   . Fear of Current or Ex-Partner: Not on file  . Emotionally Abused: Not on file  . Physically Abused: Not on file  . Sexually Abused: Not on file   Social History  Tobacco Use  Smoking Status Never Smoker  Smokeless Tobacco Never Used  Tobacco Comment   tobacco use -no   Social History   Substance and Sexual Activity  Alcohol Use No    Family History:  Family History  Problem Relation Age of Onset  . COPD Mother        alive @ 28  . Atrial fibrillation Mother   . Stroke Father        died @ 26  . Breast cancer Maternal Aunt 70    . COPD Maternal Grandmother   . Ovarian cancer Neg Hx   . Diabetes Neg Hx     Past medical history, surgical history, medications, allergies, family history and social history reviewed with patient today and changes made to appropriate areas of the chart.   Review of Systems  Constitutional: Negative.   HENT: Negative.   Eyes: Negative.   Respiratory: Negative.   Cardiovascular: Negative.   Gastrointestinal: Positive for abdominal pain. Negative for blood in stool, constipation, diarrhea, heartburn, melena, nausea and vomiting.       Not swallowing well  Genitourinary: Negative.   Musculoskeletal: Negative.   Skin: Negative.   Endo/Heme/Allergies: Positive for environmental allergies. Negative for polydipsia. Bruises/bleeds easily.  Psychiatric/Behavioral: Negative.     All other ROS negative except what is listed above and in the HPI.      Objective:    BP 121/83   Pulse 76   Temp 98.3 F (36.8 C) (Oral)   Ht 5' 2.25" (1.581 m)   Wt 148 lb 3.2 oz (67.2 kg)   SpO2 99%   BMI 26.89 kg/m   Wt Readings from Last 3 Encounters:  04/29/20 148 lb 3.2 oz (67.2 kg)  03/05/20 148 lb (67.1 kg)  03/05/20 148 lb (67.1 kg)     Visual Acuity Screening   Right eye Left eye Both eyes  Without correction:     With correction: 20/20 20/20 20/20     Physical Exam Vitals and nursing note reviewed.  Constitutional:      General: She is not in acute distress.    Appearance: Normal appearance. She is not ill-appearing, toxic-appearing or diaphoretic.  HENT:     Head: Normocephalic and atraumatic.     Right Ear: Tympanic membrane, ear canal and external ear normal. There is no impacted cerumen.     Left Ear: Tympanic membrane, ear canal and external ear normal. There is no impacted cerumen.     Nose: Nose normal. No congestion or rhinorrhea.     Mouth/Throat:     Mouth: Mucous membranes are moist.     Pharynx: Oropharynx is clear. No oropharyngeal exudate or posterior oropharyngeal  erythema.  Eyes:     General: No scleral icterus.       Right eye: No discharge.        Left eye: No discharge.     Extraocular Movements: Extraocular movements intact.     Conjunctiva/sclera: Conjunctivae normal.     Pupils: Pupils are equal, round, and reactive to light.  Neck:     Vascular: No carotid bruit.  Cardiovascular:     Rate and Rhythm: Normal rate and regular rhythm.     Pulses: Normal pulses.     Heart sounds: No murmur heard.  No friction rub. No gallop.   Pulmonary:     Effort: Pulmonary effort is normal. No respiratory distress.     Breath sounds: Normal breath sounds. No stridor. No wheezing, rhonchi or rales.  Chest:     Chest wall: No tenderness.  Abdominal:     General: Abdomen is flat. Bowel sounds are normal. There is no distension.     Palpations: Abdomen is soft. There is no mass.     Tenderness: There is no abdominal tenderness. There is no right CVA tenderness, left CVA tenderness, guarding or rebound.     Hernia: No hernia is present.  Genitourinary:    Comments: Breast and pelvic exams deferred with shared decision making Musculoskeletal:        General: No swelling, tenderness, deformity or signs of injury.     Cervical back: Normal range of motion and neck supple. No rigidity. No muscular tenderness.     Right lower leg: No edema.     Left lower leg: No edema.  Lymphadenopathy:     Cervical: No cervical adenopathy.  Skin:    General: Skin is warm and dry.     Capillary Refill: Capillary refill takes less than 2 seconds.     Coloration: Skin is not jaundiced or pale.     Findings: No bruising, erythema, lesion or rash.  Neurological:     General: No focal deficit present.     Mental Status: She is alert and oriented to person, place, and time. Mental status is at baseline.     Cranial Nerves: No cranial nerve deficit.     Sensory: No sensory deficit.     Motor: No weakness.     Coordination: Coordination normal.     Gait: Gait normal.      Deep Tendon Reflexes: Reflexes normal.  Psychiatric:        Mood and Affect: Mood normal.        Behavior: Behavior normal.        Thought Content: Thought content normal.        Judgment: Judgment normal.     6CIT Screen 04/29/2020  What Year? 0 points  What month? 0 points  What time? 0 points  Count back from 20 0 points  Months in reverse 0 points  Repeat phrase 2 points  Total Score 2     Results for orders placed or performed in visit on 04/29/20  CBC with Differential/Platelet  Result Value Ref Range   WBC 3.7 3.4 - 10.8 x10E3/uL   RBC 4.53 3.77 - 5.28 x10E6/uL   Hemoglobin 13.9 11.1 - 15.9 g/dL   Hematocrit 42.5 34.0 - 46.6 %   MCV 94 79 - 97 fL   MCH 30.7 26.6 - 33.0 pg   MCHC 32.7 31 - 35 g/dL   RDW 12.4 11.7 - 15.4 %   Platelets 137 (L) 150 - 450 x10E3/uL   Neutrophils 67 Not Estab. %   Lymphs 20 Not Estab. %   Monocytes 9 Not Estab. %   Eos 3 Not Estab. %   Basos 1 Not Estab. %   Neutrophils Absolute 2.5 1.40 - 7.00 x10E3/uL   Lymphocytes Absolute 0.8 0 - 3 x10E3/uL   Monocytes Absolute 0.3 0 - 0 x10E3/uL   EOS (ABSOLUTE) 0.1 0.0 - 0.4 x10E3/uL   Basophils Absolute 0.0 0 - 0 x10E3/uL   Immature Granulocytes 0 Not Estab. %   Immature Grans (Abs) 0.0 0.0 - 0.1 x10E3/uL  Comprehensive metabolic panel  Result Value Ref Range   Glucose 80 65 - 99 mg/dL   BUN 13 8 - 27 mg/dL   Creatinine, Ser 0.86 0.57 - 1.00 mg/dL   GFR calc non Af Wyvonnia Lora  71 >59 mL/min/1.73   GFR calc Af Amer 82 >59 mL/min/1.73   BUN/Creatinine Ratio 15 12 - 28   Sodium 139 134 - 144 mmol/L   Potassium 3.7 3.5 - 5.2 mmol/L   Chloride 105 96 - 106 mmol/L   CO2 25 20 - 29 mmol/L   Calcium 9.4 8.7 - 10.3 mg/dL   Total Protein 6.5 6.0 - 8.5 g/dL   Albumin 4.4 3.8 - 4.8 g/dL   Globulin, Total 2.1 1.5 - 4.5 g/dL   Albumin/Globulin Ratio 2.1 1.2 - 2.2   Bilirubin Total 1.5 (H) 0.0 - 1.2 mg/dL   Alkaline Phosphatase 80 44 - 121 IU/L   AST 25 0 - 40 IU/L   ALT 25 0 - 32 IU/L  Lipid Panel  w/o Chol/HDL Ratio  Result Value Ref Range   Cholesterol, Total 186 100 - 199 mg/dL   Triglycerides 54 0 - 149 mg/dL   HDL 78 >39 mg/dL   VLDL Cholesterol Cal 10 5 - 40 mg/dL   LDL Chol Calc (NIH) 98 0 - 99 mg/dL  TSH  Result Value Ref Range   TSH 2.140 0.450 - 4.500 uIU/mL  Urinalysis, Routine w reflex microscopic  Result Value Ref Range   Specific Gravity, UA 1.015 1.005 - 1.030   pH, UA 6.5 5.0 - 7.5   Color, UA Yellow Yellow   Appearance Ur Clear Clear   Leukocytes,UA Negative Negative   Protein,UA Negative Negative/Trace   Glucose, UA Negative Negative   Ketones, UA Negative Negative   RBC, UA Negative Negative   Bilirubin, UA Negative Negative   Urobilinogen, Ur 0.2 0.2 - 1.0 mg/dL   Nitrite, UA Negative Negative  HIV Antibody (routine testing w rflx)  Result Value Ref Range   HIV Screen 4th Generation wRfx Non Reactive Non Reactive  Hgb A1c w/o eAG  Result Value Ref Range   Hgb A1c MFr Bld 5.2 4.8 - 5.6 %      Assessment & Plan:   Problem List Items Addressed This Visit      Cardiovascular and Mediastinum   Ascending aortic aneurysm (HCC)    Will keep BP and cholesterol under good control. Continue to follow with cardiology. Call with any concerns.       Relevant Orders   CBC with Differential/Platelet (Completed)   EKG 12-Lead (Completed)     Endocrine   Hypothyroidism    Rechecking labs today. Await results. Treat as needed.       Relevant Orders   Comprehensive metabolic panel (Completed)   TSH (Completed)    Other Visit Diagnoses    Welcome to Medicare preventive visit    -  Primary   Preventative care discussed today as below.    Relevant Orders   EKG 12-Lead (Completed)   Senile purpura ([place])       Reassured patient. Continue to monitor. Call with any concerns.    Relevant Orders   CBC with Differential/Platelet (Completed)   History of radiation therapy       Relevant Orders   Comprehensive metabolic panel (Completed)   Screening for  cardiovascular condition       EKG done today.    Relevant Orders   Lipid Panel w/o Chol/HDL Ratio (Completed)   EKG 12-Lead (Completed)   Screening for diabetes mellitus       Labs drawn today. Await results.    Relevant Orders   Comprehensive metabolic panel (Completed)   Hgb A1c w/o eAG (Completed)   Lower  abdominal pain       Will check labs including UA. Await results. Treat as needed. if not getting better, consider [place].   Relevant Orders   Urinalysis, Routine w reflex microscopic (Completed)   Screening for HIV without presence of risk factors       Labs drawn today. Await results.    Relevant Orders   HIV Antibody (routine testing w rflx) (Completed)   Screening for osteoporosis       DEAX ordered today.    Relevant Orders   DG Bone Density       Preventative Services:  AAA screening: N/A Health Risk Assessment and Personalized Prevention Plan:Done today Bone Mass Measurements: Ordered today Breast Cancer Screening: Scheduled CVD Screening: Done today Cervical Cancer Screening: N/A Colon Cancer Screening: Up to date Depression Screening: Done today Diabetes Screening: Done today Glaucoma Screening: See your eye doctor Hepatitis B vaccine: N/A Hepatitis C screening: up to date HIV Screening: Done today Flu Vaccine: N/A Covid Vaccine: Declined Lung cancer Screening: N/A Obesity Screening: Done today Pneumonia Vaccines (2): Declined STI Screening: N/A  Follow up plan: Return in about 6 months (around 10/28/2020).   LABORATORY TESTING:  - Pap smear: not applicable  IMMUNIZATIONS:   - Tdap: Tetanus vaccination status reviewed: last tetanus booster within 10 years. - Influenza: Refused - Pneumovax: Refused - Prevnar: Refused - COVID vaccine: Refused  SCREENING: -Mammogram: Ordered today  - Colonoscopy: Up to date  - Bone Density: Ordered today  -Hearing Test: Ordered today   PATIENT COUNSELING:   Advised to take 1 mg of folate supplement per day if  capable of pregnancy.   Sexuality: Discussed sexually transmitted diseases, partner selection, use of condoms, avoidance of unintended pregnancy  and contraceptive alternatives.   Advised to avoid cigarette smoking.  I discussed with the patient that most people either abstain from alcohol or drink within safe limits (<=14/week and <=4 drinks/occasion for males, <=7/weeks and <= 3 drinks/occasion for females) and that the risk for alcohol disorders and other health effects rises proportionally with the number of drinks per week and how often a drinker exceeds daily limits.  Discussed cessation/primary prevention of drug use and availability of treatment for abuse.   Diet: Encouraged to adjust caloric intake to maintain  or achieve ideal body weight, to reduce intake of dietary saturated fat and total fat, to limit sodium intake by avoiding high sodium foods and not adding table salt, and to maintain adequate dietary potassium and calcium preferably from fresh fruits, vegetables, and low-fat dairy products.    stressed the importance of regular exercise  Injury prevention: Discussed safety belts, safety helmets, smoke detector, smoking near bedding or upholstery.   Dental health: Discussed importance of regular tooth brushing, flossing, and dental visits.    NEXT PREVENTATIVE PHYSICAL DUE IN 1 YEAR. Return in about 6 months (around 10/28/2020).

## 2020-04-29 NOTE — Patient Instructions (Addendum)
Preventative Services:  AAA screening: N/A Health Risk Assessment and Personalized Prevention Plan:Done today Bone Mass Measurements: Ordered today Breast Cancer Screening: Scheduled CVD Screening: Done today Cervical Cancer Screening: N/A Colon Cancer Screening: Up to date Depression Screening: Done today Diabetes Screening: Done today Glaucoma Screening: See your eye doctor Hepatitis B vaccine: N/A Hepatitis C screening: up to date HIV Screening: Done today Flu Vaccine: N/A Covid Vaccine: Declined Lung cancer Screening: N/A Obesity Screening: Done today Pneumonia Vaccines (2): Declined STI Screening: N/A   Health Maintenance After Age 84 After age 42, you are at a higher risk for certain long-term diseases and infections as well as injuries from falls. Falls are a major cause of broken bones and head injuries in people who are older than age 36. Getting regular preventive care can help to keep you healthy and well. Preventive care includes getting regular testing and making lifestyle changes as recommended by your health care provider. Talk with your health care provider about:  Which screenings and tests you should have. A screening is a test that checks for a disease when you have no symptoms.  A diet and exercise plan that is right for you. What should I know about screenings and tests to prevent falls? Screening and testing are the best ways to find a health problem early. Early diagnosis and treatment give you the best chance of managing medical conditions that are common after age 75. Certain conditions and lifestyle choices may make you more likely to have a fall. Your health care provider may recommend:  Regular vision checks. Poor vision and conditions such as cataracts can make you more likely to have a fall. If you wear glasses, make sure to get your prescription updated if your vision changes.  Medicine review. Work with your health care provider to regularly review all  of the medicines you are taking, including over-the-counter medicines. Ask your health care provider about any side effects that may make you more likely to have a fall. Tell your health care provider if any medicines that you take make you feel dizzy or sleepy.  Osteoporosis screening. Osteoporosis is a condition that causes the bones to get weaker. This can make the bones weak and cause them to break more easily.  Blood pressure screening. Blood pressure changes and medicines to control blood pressure can make you feel dizzy.  Strength and balance checks. Your health care provider may recommend certain tests to check your strength and balance while standing, walking, or changing positions.  Foot health exam. Foot pain and numbness, as well as not wearing proper footwear, can make you more likely to have a fall.  Depression screening. You may be more likely to have a fall if you have a fear of falling, feel emotionally low, or feel unable to do activities that you used to do.  Alcohol use screening. Using too much alcohol can affect your balance and may make you more likely to have a fall. What actions can I take to lower my risk of falls? General instructions  Talk with your health care provider about your risks for falling. Tell your health care provider if: ? You fall. Be sure to tell your health care provider about all falls, even ones that seem minor. ? You feel dizzy, sleepy, or off-balance.  Take over-the-counter and prescription medicines only as told by your health care provider. These include any supplements.  Eat a healthy diet and maintain a healthy weight. A healthy diet includes low-fat dairy  products, low-fat (lean) meats, and fiber from whole grains, beans, and lots of fruits and vegetables. Home safety  Remove any tripping hazards, such as rugs, cords, and clutter.  Install safety equipment such as grab bars in bathrooms and safety rails on stairs.  Keep rooms and  walkways well-lit. Activity   Follow a regular exercise program to stay fit. This will help you maintain your balance. Ask your health care provider what types of exercise are appropriate for you.  If you need a cane or walker, use it as recommended by your health care provider.  Wear supportive shoes that have nonskid soles. Lifestyle  Do not drink alcohol if your health care provider tells you not to drink.  If you drink alcohol, limit how much you have: ? 0-1 drink a day for women. ? 0-2 drinks a day for men.  Be aware of how much alcohol is in your drink. In the [place] one drink equals one typical bottle of beer (12 oz), one-half glass of wine (5 oz), or one shot of hard liquor (1 oz).  Do not use any products that contain nicotine or tobacco, such as cigarettes and e-cigarettes. If you need help quitting, ask your health care provider. Summary  Having a healthy lifestyle and getting preventive care can help to protect your health and wellness after age 7.  Screening and testing are the best way to find a health problem early and help you avoid having a fall. Early diagnosis and treatment give you the best chance for managing medical conditions that are more common for people who are older than age 4.  Falls are a major cause of broken bones and head injuries in people who are older than age 28. Take precautions to prevent a fall at home.  Work with your health care provider to learn what changes you can make to improve your health and wellness and to prevent falls. This information is not intended to replace advice given to you by your health care provider. Make sure you discuss any questions you have with your health care provider. Document Revised: 10/19/2018 Document Reviewed: 05/11/2017 Elsevier Patient Education  2020 [employer].

## 2020-04-30 LAB — COMPREHENSIVE METABOLIC PANEL
ALT: 25 IU/L (ref 0–32)
AST: 25 IU/L (ref 0–40)
Albumin/Globulin Ratio: 2.1 (ref 1.2–2.2)
Albumin: 4.4 g/dL (ref 3.8–4.8)
Alkaline Phosphatase: 80 IU/L (ref 44–121)
BUN/Creatinine Ratio: 15 (ref 12–28)
BUN: 13 mg/dL (ref 8–27)
Bilirubin Total: 1.5 mg/dL — ABNORMAL HIGH (ref 0.0–1.2)
CO2: 25 mmol/L (ref 20–29)
Calcium: 9.4 mg/dL (ref 8.7–10.3)
Chloride: 105 mmol/L (ref 96–106)
Creatinine, Ser: 0.86 mg/dL (ref 0.57–1.00)
GFR calc Af Amer: 82 mL/min/{1.73_m2} (ref >59)
GFR calc non Af Amer: 71 mL/min/{1.73_m2} (ref >59)
Globulin, Total: 2.1 g/dL (ref 1.5–4.5)
Glucose: 80 mg/dL (ref 65–99)
Potassium: 3.7 mmol/L (ref 3.5–5.2)
Sodium: 139 mmol/L (ref 134–144)
Total Protein: 6.5 g/dL (ref 6.0–8.5)

## 2020-04-30 LAB — CBC WITH DIFFERENTIAL/PLATELET
Basophils Absolute: 0 10*3/uL (ref 0.0–0.2)
Basos: 1 %
EOS (ABSOLUTE): 0.1 10*3/uL (ref 0.0–0.4)
Eos: 3 %
Hematocrit: 42.5 % (ref 34.0–46.6)
Hemoglobin: 13.9 g/dL (ref 11.1–15.9)
Immature Grans (Abs): 0 10*3/uL (ref 0.0–0.1)
Immature Granulocytes: 0 %
Lymphocytes Absolute: 0.8 10*3/uL (ref 0.7–3.1)
Lymphs: 20 %
MCH: 30.7 pg (ref 26.6–33.0)
MCHC: 32.7 g/dL (ref 31.5–35.7)
MCV: 94 fL (ref 79–97)
Monocytes Absolute: 0.3 10*3/uL (ref 0.1–0.9)
Monocytes: 9 %
Neutrophils Absolute: 2.5 10*3/uL (ref 1.4–7.0)
Neutrophils: 67 %
Platelets: 137 10*3/uL — ABNORMAL LOW (ref 150–450)
RBC: 4.53 x10E6/uL (ref 3.77–5.28)
RDW: 12.4 % (ref 11.7–15.4)
WBC: 3.7 10*3/uL (ref 3.4–10.8)

## 2020-04-30 LAB — LIPID PANEL W/O CHOL/HDL RATIO
Cholesterol, Total: 186 mg/dL (ref 100–199)
HDL: 78 mg/dL (ref >39)
LDL Chol Calc (NIH): 98 mg/dL (ref 0–99)
Triglycerides: 54 mg/dL (ref 0–149)
VLDL Cholesterol Cal: 10 mg/dL (ref 5–40)

## 2020-04-30 LAB — TSH: TSH: 2.14 u[IU]/mL (ref 0.450–4.500)

## 2020-04-30 LAB — HIV ANTIBODY (ROUTINE TESTING W REFLEX): HIV Screen 4th Generation wRfx: NONREACTIVE

## 2020-04-30 LAB — HGB A1C W/O EAG: Hgb A1c MFr Bld: 5.2 % (ref 4.8–5.6)

## 2020-05-01 NOTE — Progress Notes (Signed)
Interpreted by me on 04/29/20. NSR at 60bpm, no ST segement changes

## 2020-05-02 ENCOUNTER — Encounter: Payer: Self-pay | Admitting: Family Medicine

## 2020-05-02 NOTE — Assessment & Plan Note (Signed)
Will keep BP and cholesterol under good control. Continue to follow with cardiology. Call with any concerns.  

## 2020-05-02 NOTE — Assessment & Plan Note (Signed)
Rechecking labs today. Await results. Treat as needed.  °

## 2020-05-05 ENCOUNTER — Encounter: Payer: Self-pay | Admitting: Family Medicine

## 2020-05-20 ENCOUNTER — Telehealth: Payer: Self-pay

## 2020-05-20 ENCOUNTER — Encounter: Payer: Self-pay | Admitting: Oncology

## 2020-05-20 ENCOUNTER — Encounter: Payer: Self-pay | Admitting: Family Medicine

## 2020-05-20 ENCOUNTER — Other Ambulatory Visit: Payer: Self-pay | Admitting: Family Medicine

## 2020-05-20 ENCOUNTER — Inpatient Hospital Stay: Payer: PPO | Attending: Oncology | Admitting: Oncology

## 2020-05-20 ENCOUNTER — Inpatient Hospital Stay: Payer: PPO

## 2020-05-20 ENCOUNTER — Other Ambulatory Visit: Payer: Self-pay

## 2020-05-20 VITALS — BP 109/64 | HR 69 | Temp 98.6°F | Wt 149.9 lb

## 2020-05-20 DIAGNOSIS — Z86718 Personal history of other venous thrombosis and embolism: Secondary | ICD-10-CM | POA: Diagnosis not present

## 2020-05-20 DIAGNOSIS — F419 Anxiety disorder, unspecified: Secondary | ICD-10-CM | POA: Diagnosis not present

## 2020-05-20 DIAGNOSIS — Z79899 Other long term (current) drug therapy: Secondary | ICD-10-CM | POA: Diagnosis not present

## 2020-05-20 DIAGNOSIS — C01 Malignant neoplasm of base of tongue: Secondary | ICD-10-CM | POA: Insufficient documentation

## 2020-05-20 DIAGNOSIS — Z8589 Personal history of malignant neoplasm of other organs and systems: Secondary | ICD-10-CM | POA: Diagnosis not present

## 2020-05-20 DIAGNOSIS — Z7982 Long term (current) use of aspirin: Secondary | ICD-10-CM | POA: Diagnosis not present

## 2020-05-20 DIAGNOSIS — Z08 Encounter for follow-up examination after completed treatment for malignant neoplasm: Secondary | ICD-10-CM | POA: Diagnosis not present

## 2020-05-20 DIAGNOSIS — Z85818 Personal history of malignant neoplasm of other sites of lip, oral cavity, and pharynx: Secondary | ICD-10-CM | POA: Insufficient documentation

## 2020-05-20 DIAGNOSIS — Z923 Personal history of irradiation: Secondary | ICD-10-CM | POA: Diagnosis not present

## 2020-05-20 DIAGNOSIS — D696 Thrombocytopenia, unspecified: Secondary | ICD-10-CM | POA: Insufficient documentation

## 2020-05-20 DIAGNOSIS — E890 Postprocedural hypothyroidism: Secondary | ICD-10-CM | POA: Diagnosis not present

## 2020-05-20 DIAGNOSIS — Z9223 Personal history of estrogen therapy: Secondary | ICD-10-CM | POA: Diagnosis not present

## 2020-05-20 DIAGNOSIS — Z636 Dependent relative needing care at home: Secondary | ICD-10-CM

## 2020-05-20 DIAGNOSIS — R87820 Cervical low risk human papillomavirus (HPV) DNA test positive: Secondary | ICD-10-CM | POA: Insufficient documentation

## 2020-05-20 LAB — CBC WITH DIFFERENTIAL/PLATELET
Abs Immature Granulocytes: 0.01 10*3/uL (ref 0.00–0.07)
Basophils Absolute: 0.1 10*3/uL (ref 0.0–0.1)
Basophils Relative: 1 %
Eosinophils Absolute: 0.2 10*3/uL (ref 0.0–0.5)
Eosinophils Relative: 4 %
HCT: 40.9 % (ref 36.0–46.0)
Hemoglobin: 13.8 g/dL (ref 12.0–15.0)
Immature Granulocytes: 0 %
Lymphocytes Relative: 18 %
Lymphs Abs: 0.8 10*3/uL (ref 0.7–4.0)
MCH: 30.9 pg (ref 26.0–34.0)
MCHC: 33.7 g/dL (ref 30.0–36.0)
MCV: 91.7 fL (ref 80.0–100.0)
Monocytes Absolute: 0.4 10*3/uL (ref 0.1–1.0)
Monocytes Relative: 10 %
Neutro Abs: 2.9 10*3/uL (ref 1.7–7.7)
Neutrophils Relative %: 67 %
Platelets: 135 10*3/uL — ABNORMAL LOW (ref 150–400)
RBC: 4.46 MIL/uL (ref 3.87–5.11)
RDW: 12.5 % (ref 11.5–15.5)
WBC: 4.3 10*3/uL (ref 4.0–10.5)
nRBC: 0 % (ref 0.0–0.2)

## 2020-05-20 NOTE — Chronic Care Management (AMB) (Signed)
  Chronic Care Management   Outreach Note  05/20/2020 Name: Wendy Kelly MRN: [id number] DOB: 1955-10-12  Wendy Kelly is a 64 y.o. year old female who is a primary care patient of Valerie Roys, DO. I reached out to [employer] by phone today in response to a referral sent by Wendy Kelly PCP, Dr.Johnson     An unsuccessful telephone outreach was attempted today. The patient was referred to the case management team for assistance with care management and care coordination.   Follow Up Plan: A HIPAA compliant phone message was left for the patient providing contact information and requesting a return call.  The care management team will reach out to the patient again over the next 7 days.  If patient returns call to provider office, please advise to call [facility] at [address], [place], [place], [place] [address] Direct Dial: [phone] [email or web address]: [email or web address]

## 2020-05-20 NOTE — Progress Notes (Signed)
Ref ccm

## 2020-05-21 NOTE — Chronic Care Management (AMB) (Signed)
  Chronic Care Management   Note  05/21/2020 Name: Wendy Kelly MRN: [id number] DOB: 08-29-1955  Wendy Kelly is a 64 y.o. year old female who is a primary care patient of Valerie Roys, DO. I reached out to [employer] by phone today in response to a referral sent by Wendy Kelly's PCP, Dr. Wynetta Emery      Wendy Kelly was given information about Chronic Care Management services today including:  1. CCM service includes personalized support from designated clinical staff supervised by her physician, including individualized plan of care and coordination with other care providers 2. 24/7 contact phone numbers for assistance for urgent and routine care needs. 3. Service will only be billed when office clinical staff spend 20 minutes or more in a month to coordinate care. 4. Only one practitioner may furnish and bill the service in a calendar month. 5. The patient may stop CCM services at any time (effective at the end of the month) by phone call to the office staff. 6. The patient will be responsible for cost sharing (co-pay) of up to 20% of the service fee (after annual deductible is met).  Patient agreed to services and verbal consent obtained.   Follow up plan: Telephone appointment with care management team member scheduled for:06/04/2020  Wendy Kelly, Wendy Kelly, Wendy Kelly, Wendy Kelly [id number] Direct Dial: [phone] [email or web address] Website: [email or web address]

## 2020-05-22 NOTE — Progress Notes (Signed)
Hematology/Oncology Consult note [facility]  [phone] [phone]  Patient Care Team: Valerie Roys, DO as PCP - General (Family Medicine) Rockey Situ Kathlene November, MD as Consulting Physician (Cardiology) Sindy Guadeloupe, MD as Consulting Physician (Oncology) Carloyn Manner, MD as Referring Physician (Otolaryngology) Noreene Filbert, MD as Referring Physician (Radiation Oncology) Greg Cutter, LCSW as [facility] (Licensed Clinical Social Worker)   Name of the patient: Wendy Kelly  [id number]  February 21, 1956   Date of visit: 05/22/20  Diagnosis- 1.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  She underwent concurrent chemoradiation with weekly cisplatin which she completed in February 2019.  She also had left lower extremity DVT after she finished chemotherapy and took Xarelto for 3 months following which she stopped taking it.  She continues to be in remission since then.  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  She is caring for her elderly mom which has been a source of anxiety for her.  Denies other complaints  ECOG PS- 1 Pain scale- 0   Review of systems- Review of Systems  Constitutional: Positive for malaise/fatigue. Negative for chills, fever and weight loss.  HENT: Negative for congestion, ear discharge and nosebleeds.        Dry mouth  Eyes: Negative for blurred vision.  Respiratory: Negative for cough, hemoptysis, sputum production, shortness of  breath and wheezing.   Cardiovascular: Negative for chest pain, palpitations, orthopnea and claudication.  Gastrointestinal: Negative for abdominal pain, blood in stool, constipation, diarrhea, heartburn, melena, nausea and vomiting.  Genitourinary: Negative for dysuria, flank pain, frequency, hematuria and urgency.  Musculoskeletal: Negative for back pain, joint pain and myalgias.  Skin: Negative for rash.  Neurological: Negative for dizziness, tingling, focal weakness, seizures, weakness and headaches.  Endo/Heme/Allergies: Does not bruise/bleed easily.  Psychiatric/Behavioral: Negative for depression and suicidal ideas. The patient is nervous/anxious. The patient does not have insomnia.       Allergies  Allergen Reactions  . Cefdinir Swelling    Swelling in mouth   . Cephalosporins Swelling  . Dairy Aid [Lactase] Swelling    Milk and milk products cause swelling and tingling of tongue  . Flagyl [Metronidazole] Swelling    Mouth and throat and ears go red and burn  . Lactose Intolerance (Gi) Other (See Comments)    GI upset.  Eats only gluten free  . Penicillins Rash    Rash/hives  . Phenylephrine-Guaifenesin Anaphylaxis    Night terrors  . Sulfa Antibiotics Rash and Hives    Other reaction(s): Unknown   . Sulfonamide Derivatives Rash    Rash/itching.  From head to her toes, the itching was horrible. Penicillin is the worst  . Tape Rash    Adhesives all cause a problem severely.  It doesn't matter if it is paper tape or not.  Marland Kitchen Dexilant [Dexlansoprazole]     Pressure in head like head is in a vice and being squeezed  . Entex Lq [Phenylephrine-Guaifenesin]     Night terrors  . Other Other (See Comments)    Flu vaccine:  Weakness, dizziness, tia type symptoms.    . Oxycodone Nausea And Vomiting    And itching  . Succinylcholine Other (See Comments)    Trouble waking up  . Vancomycin Rash  . Clarithromycin Itching and Other (See Comments)  . Tetracycline Other (See  Comments)    Unkown  . Codeine Nausea And Vomiting  . Guaifenesin & Derivatives Other (See Comments)    Night terrors  . Lactobacillus Itching  . Naproxen Sodium     Itching/rash  . Pseudoephedrine Rash  . Tetracyclines & Related Other (See Comments)    Was taking flagyl at the same time; unsure which caused the swelling  Swelling of throat. Pharmacist states that he believes it was the flagyl and not the tetracycline  . Wheat Bran Other (See Comments)    Upset stomach. Now eats gluten free     Past Medical History:  Diagnosis Date  . AA (aortic aneurysm) ([place])    a. 04/2011 Echo: Ao Root: 4.1cm, Asc Ao 4.7cm.  . Anxiety   . AV block, 2nd degree    a. 05/2005 - s/p MDT Adapta ADDR01 Dual Chamber PPM  . Bicuspid aortic valve   . Cancer ([place])    Throat  . Chest pain    a. 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  Marland Kitchen Deep vein thrombosis (DVT) of left lower extremity ([place]) 10/03/2017  . Dehydration 08/11/2017  . Depression   . Expressive aphasia    a. ongoing since 04/2011 - seen by neurology - ? TIA vs. Migraine  . Facial numbness    a. ongoing since 04/2011 - seen by neurology - ? TIA vs. Migraine  . GERD (gastroesophageal reflux disease)   . Low blood pressure   . Personal history of chemotherapy 2018-2019   Throat  . Personal history of radiation therapy 2018-2019   Throat  . Presence of permanent cardiac pacemaker   . Shingles    pt had on right side of head and his neck  . 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 Syncope    a. 04/2011 Echo: EF 55-65%, No RWMA, Gr 1 DD.  Marland Kitchen Thyroid disease      Past Surgical History:  Procedure Laterality Date  . ABDOMINAL WALL MESH  REMOVAL    . bladder tack    . cataract Bilateral 2004  . CHOLECYSTECTOMY  2000  . COLONOSCOPY WITH PROPOFOL N/A 06/26/2019   Procedure: COLONOSCOPY WITH PROPOFOL;  Surgeon: Lucilla Lame, MD;  Location: [facility] ENDOSCOPY;  Service: Endoscopy;  Laterality: N/A;  . DILATION AND CURETTAGE OF  UTERUS    . DIRECT LARYNGOSCOPY Right 06/16/2017   Procedure: MICRO DIRECT LARYNGOSCOPY WITH BIOPSY OF RIGHT BASE OF TONGUE;  Surgeon: Carloyn Manner, MD;  Location: [facility] ORS;  Service: ENT;  Laterality: Right;  . EP IMPLANTABLE DEVICE N/A 06/04/2015   Procedure: PPM Generator Changeout;  Surgeon: Deboraha Sprang, MD;  Location: [facility] CV LAB;  Service: Cardiovascular;  Laterality: N/A;  . ESOPHAGOGASTRODUODENOSCOPY (EGD) WITH PROPOFOL N/A 12/18/2015   Procedure: ESOPHAGOGASTRODUODENOSCOPY (EGD) WITH gastric biopsy and dilation;  Surgeon: Lucilla Lame, MD;  Location: [facility];  Service: Endoscopy;  Laterality: N/A;  . EYE SURGERY    . INSERT / REPLACE / REMOVE PACEMAKER    . IR FLUORO GUIDE PORT INSERTION RIGHT  06/22/2017  . IR REMOVAL TUN ACCESS W/ PORT W/O FL MOD SED  04/14/2018  . MOHS SURGERY  02/2018  . PACEMAKER INSERTION  2006  Med[employer]onic Adapta [id number]  . TONSILLECTOMY    . TONSILLECTOMY    . VAGINAL HYSTERECTOMY  2013   mad/cope    Social History   Socioeconomic History  . Marital status: Married    Spouse name: Not on file  . Number of children: Not on file  . Years of education: Not on file  . Highest education level: Not on file  Occupational History  . Not on file  Tobacco Use  . Smoking status: Never Smoker  . Smokeless tobacco: Never Used  . Tobacco comment: tobacco use -no  Vaping Use  . Vaping Use: Never used  Substance and Sexual Activity  . Alcohol use: No  . Drug use: No  . Sexual activity: Yes    Birth control/protection: Surgical  Other Topics Concern  . Not on file  Social History Narrative   Lives in [place] with husband.  Works out regularly.  Works as a Geophysicist/field seismologist - owns own business.    Social Determinants of Health   Financial Resource Strain:   . Difficulty of Paying Living Expenses: Not on file  Food Insecurity:   . Worried About Charity fundraiser in the Last Year: Not on file  . Ran Out of Food in the  Last Year: Not on file  Transportation Needs:   . Lack of Transportation (Medical): Not on file  . Lack of Transportation (Non-Medical): Not on file  Physical Activity:   . Days of Exercise per Week: Not on file  . Minutes of Exercise per Session: Not on file  Stress:   . Feeling of Stress : Not on file  Social Connections:   . Frequency of Communication with Friends and Family: Not on file  . Frequency of Social Gatherings with Friends and Family: Not on file  . Attends Religious Services: Not on file  . Active Member of Clubs or Organizations: Not on file  . Attends Archivist Meetings: Not on file  . Marital Status: Not on file  Intimate Partner Violence:   . Fear of Current or Ex-Partner: Not on file  . Emotionally Abused: Not on file  . Physically Abused: Not on file  . Sexually Abused: Not on file    Family History  Problem Relation Age of Onset  . COPD Mother        alive @ 40  . Atrial fibrillation Mother   . Stroke Father        died @ 87  . Breast cancer Maternal Aunt 70  . COPD Maternal Grandmother   . Ovarian cancer Neg Hx   . Diabetes Neg Hx      Current Outpatient Medications:  .  aspirin 81 MG tablet, Take 81 mg by mouth daily., Disp: , Rfl:  .  b complex vitamins tablet, Take 2 tablets by mouth daily. , Disp: , Rfl:  .  BLACK ELDERBERRY,BERRY-FLOWER, PO, Take 2 capsules by mouth daily. , Disp: , Rfl:  .  Calcium Carb-Cholecalciferol (CALCIUM 500 + D3 PO), Take 2 Doses by mouth daily. , Disp: , Rfl:  .  CRANBERRY SOFT PO, Take 2 each by mouth daily. , Disp: , Rfl:  .  Digestive Enzymes (DIGESTIVE ENZYME PO), Take 1-2 capsules by mouth daily., Disp: , Rfl:  .  fexofenadine (ALLEGRA) 180 MG tablet, Take 180 mg by mouth daily., Disp: , Rfl:  .  montelukast (SINGULAIR) 10 MG tablet, Take 10 mg by mouth at bedtime. , Disp: , Rfl:  .  PRESCRIPTION MEDICATION, Place 1 tablet vaginally 3 (three) times a week. E3 1mg  and Testosterone 1mg  compound, Disp: ,  Rfl:  .  SYNTHROID 75 MCG tablet, TAKE 1 TABLET BY MOUTH EVERY DAY, Disp: 90 tablet, Rfl: 3 No current facility-administered medications for this visit.  Facility-Administered Medications Ordered in Other Visits:  .  0.9 %  sodium chloride infusion, , Intravenous, Once, Sindy Guadeloupe, MD  Physical exam:  Vitals:   05/20/20 0947  BP: 109/64  Pulse: 69  Temp: 98.6 F (37 C)  TempSrc: Tympanic  Weight: 149 lb 14.4 oz (68 kg)   Physical Exam Constitutional:      General: She is not in acute distress. HENT:     Mouth/Throat:     Mouth: Mucous membranes are moist.     Pharynx: Oropharynx is clear.  Cardiovascular:     Rate and Rhythm: Normal rate and regular rhythm.     Heart sounds: Normal heart sounds.  Pulmonary:     Effort: Pulmonary effort is normal.     Breath sounds: Normal breath sounds.  Abdominal:     General: Bowel sounds are normal.     Palpations: Abdomen is soft.  Lymphadenopathy:     Comments: No palpable cervical, supraclavicular, axillary or inguinal adenopathy   Skin:    General: Skin is warm and dry.  Neurological:     Mental Status: She is alert and oriented to person, place, and time.      CMP Latest Ref Rng & Units 04/29/2020  Glucose 65 - 99 mg/dL 80  BUN 8 - 27 mg/dL 13  Creatinine 0.57 - 1.00 mg/dL 0.86  Sodium 134 - 144 mmol/L 139  Potassium 3.5 - 5.2 mmol/L 3.7  Chloride 96 - 106 mmol/L 105  CO2 20 - 29 mmol/L 25  Calcium 8.7 - 10.3 mg/dL 9.4  Total Protein 6.0 - 8.5 g/dL 6.5  Total Bilirubin 0.0 - 1.2 mg/dL 1.5(H)  Alkaline Phos 44 - 121 IU/L 80  AST 0 - 40 IU/L 25  ALT 0 - 32 IU/L 25   CBC Latest Ref Rng & Units 05/20/2020  WBC 4.0 - 10.5 K/uL 4.3  Hemoglobin 12.0 - 15.0 g/dL 13.8  Hematocrit 36 - 46 % 40.9  Platelets 150 - 400 K/uL 135(L)      Assessment and plan- Patient is a 64 y.o. female with a history of HPV positive squamous cell carcinoma of the oropharynx stage I status post concurrent chemoradiation and currently in  remission.    This is a routine follow-up visit for head and neck cancer  Clinically patient is doing well with no concerning signs and symptoms of recurrence based on today's exam.  She is also following up with ENT regularly.  No need for surveillance imaging at this time in the absence of concerning signs and symptoms.  Patient does have a baseline mild thrombocytopenia with a platelet count to fluctuate between 120s to 140s.  This is essentially remained stable and we will continue to monitor  I will see her back in 1 years time   Visit Diagnosis 1. Encounter for follow-up surveillance of head and neck cancer      Dr. Randa Evens, MD, MPH [facility] [phone] 05/22/2020 12:23 PM

## 2020-06-02 ENCOUNTER — Ambulatory Visit
Admission: RE | Admit: 2020-06-02 | Discharge: 2020-06-02 | Disposition: A | Discharge status: Home / Self Care | Payer: PPO | Source: Ambulatory Visit | Attending: Family Medicine | Admitting: Family Medicine

## 2020-06-02 ENCOUNTER — Other Ambulatory Visit: Payer: Self-pay

## 2020-06-02 DIAGNOSIS — Z1231 Encounter for screening mammogram for malignant neoplasm of breast: Secondary | ICD-10-CM | POA: Insufficient documentation

## 2020-06-03 ENCOUNTER — Telehealth: Payer: Self-pay | Admitting: *Deleted

## 2020-06-03 NOTE — Chronic Care Management (AMB) (Signed)
  Care Management   Note  06/03/2020 Name: Wendy Kelly MRN: [id number] DOB: [birth year]  Wendy Kelly is a 64 y.o. year old female who is a primary care patient of Valerie Roys, DO and is actively engaged with the care management team. I reached out to [name] by phone today to assist with re-scheduling an initial visit with the Licensed Clinical Education officer, museum.  Follow up plan: Telephone appointment with care management team member scheduled for:06/30/2020  [facility] [facility]

## 2020-06-03 NOTE — Chronic Care Management (AMB) (Signed)
  Chronic Care Management   Note  06/03/2020 Name: Meerab Maselli MRN: [id number] DOB: 21-Apr-1956  Analyn Matusek is a 64 y.o. year old female who is a primary care patient of Valerie Roys, DO and is actively engaged with the care management team. I reached out to [name] by phone today to assist with re-scheduling an initial visit with the Licensed Clinical Social Worker  Follow up plan: Unsuccessful telephone outreach attempt made. A HIPAA compliant phone message was left for the patient providing contact information and requesting a return call.  The care management team will reach out to the patient again over the next 7 days.  If patient returns call to provider office, please advise to call [facility] at [phone].  [place] Management

## 2020-06-04 ENCOUNTER — Telehealth: Payer: PPO

## 2020-06-10 ENCOUNTER — Ambulatory Visit (INDEPENDENT_AMBULATORY_CARE_PROVIDER_SITE_OTHER): Payer: PPO

## 2020-06-10 DIAGNOSIS — R55 Syncope and collapse: Secondary | ICD-10-CM

## 2020-06-11 LAB — CUP PACEART REMOTE DEVICE CHECK
Battery Impedance: 237 Ohm
Battery Remaining Longevity: 129 mo
Battery Voltage: 2.78 V
Brady Statistic AP VP Percent: 0 %
Brady Statistic AP VS Percent: 16 %
Brady Statistic AS VP Percent: 0 %
Brady Statistic AS VS Percent: 83 %
Date Time Interrogation Session: 20211201053849
Implantable Lead Implant Date: 20061128
Implantable Lead Implant Date: 20061128
Implantable Lead Location: 753859
Implantable Lead Location: 753860
Implantable Lead Model: 4092
Implantable Lead Model: 4592
Implantable Pulse Generator Implant Date: 20161123
Lead Channel Impedance Value: 574 Ohm
Lead Channel Impedance Value: 753 Ohm
Lead Channel Pacing Threshold Amplitude: 0.5 V
Lead Channel Pacing Threshold Amplitude: 1 V
Lead Channel Pacing Threshold Pulse Width: 0.4 ms
Lead Channel Pacing Threshold Pulse Width: 0.4 ms
Lead Channel Setting Pacing Amplitude: 2 V
Lead Channel Setting Pacing Amplitude: 2.5 V
Lead Channel Setting Pacing Pulse Width: 0.4 ms
Lead Channel Setting Sensing Sensitivity: 2.8 mV

## 2020-06-12 ENCOUNTER — Other Ambulatory Visit: Payer: PPO

## 2020-06-17 NOTE — Progress Notes (Signed)
Remote pacemaker transmission.   

## 2020-06-30 ENCOUNTER — Ambulatory Visit: Payer: PPO | Admitting: Licensed Clinical Social Worker

## 2020-06-30 DIAGNOSIS — Z950 Presence of cardiac pacemaker: Secondary | ICD-10-CM

## 2020-06-30 DIAGNOSIS — R1030 Lower abdominal pain, unspecified: Secondary | ICD-10-CM

## 2020-06-30 DIAGNOSIS — E039 Hypothyroidism, unspecified: Secondary | ICD-10-CM

## 2020-06-30 DIAGNOSIS — Z636 Dependent relative needing care at home: Secondary | ICD-10-CM

## 2020-06-30 DIAGNOSIS — I951 Orthostatic hypotension: Secondary | ICD-10-CM

## 2020-06-30 NOTE — Chronic Care Management (AMB) (Signed)
Chronic Care Management    Clinical Social Work Follow Up Note  06/30/2020 Name: Wendy Kelly MRN: [id number] DOB: 25-Jun-1956  Wendy Kelly is a 64 y.o. year old female who is a primary care patient of Valerie Roys, DO. The CCM team was consulted for assistance with Caregiver Stress.   Review of patient status, including review of consultants reports, other relevant assessments, and collaboration with appropriate care team members and the patient's provider was performed as part of comprehensive patient evaluation and provision of chronic care management services.    SDOH (Social Determinants of Health) assessments performed: Yes    Outpatient Encounter Medications as of 06/30/2020  Medication Sig   aspirin 81 MG tablet Take 81 mg by mouth daily.   b complex vitamins tablet Take 2 tablets by mouth daily.    BLACK ELDERBERRY,BERRY-FLOWER, PO Take 2 capsules by mouth daily.    Calcium Carb-Cholecalciferol (CALCIUM 500 + D3 PO) Take 2 Doses by mouth daily.    CRANBERRY SOFT PO Take 2 each by mouth daily.    Digestive Enzymes (DIGESTIVE ENZYME PO) Take 1-2 capsules by mouth daily.   fexofenadine (ALLEGRA) 180 MG tablet Take 180 mg by mouth daily.   montelukast (SINGULAIR) 10 MG tablet Take 10 mg by mouth at bedtime.    PRESCRIPTION MEDICATION Place 1 tablet vaginally 3 (three) times a week. E3 1mg  and Testosterone 1mg  compound   SYNTHROID 75 MCG tablet TAKE 1 TABLET BY MOUTH EVERY DAY   Facility-Administered Encounter Medications as of 06/30/2020  Medication   0.9 %  sodium chloride infusion     Goals Addressed            This Visit's Progress    SW-Manage My Emotions       Timeframe:  Long-Range Goal Priority:  High Start Date:  06/30/20                           Expected End Date: 09/28/20                   Follow Up Date-90 days from 06/30/20   - begin personal counseling - call and visit an old friend - check out volunteer opportunities - join a  support group - laugh; watch a funny movie or comedian - learn and use visualization or guided imagery - perform a random act of kindness - practice relaxation or meditation daily - start or continue a personal journal - talk about feelings with a friend, family or spiritual advisor - practice positive thinking and self-talk    Why is this important?    When you are stressed, down or upset, your body reacts too.   For example, your blood pressure may get higher; you may have a headache or stomachache.   When your emotions get the best of you, your body's ability to fight off cold and flu gets weak.   These steps will help you manage your emotions.    Current Barriers:   Financial constraints   Limited social support  Level of care concerns  ADL IADL limitations  Social Isolation  Limited access to caregiver   Clinical Social Work Clinical Goal(s):   Over the next 120 days, patient/caregiver will work with SW to address concerns related to lack of support/resource connection. LCSW will assist patient in gaining additional support/resource connection and community resource education in order to maintain health and mental health appropriately  Over the next 120 days, patient will demonstrate improved adherence to self care as evidenced by implementing healthy self-care into her daily routine such as: attending all medical appointments, deep breathing exercises, taking time for self and self-reflection, taking medications as prescribed, drinking water and daily exercise to improve mobility.               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 Patient complains of ongoing fatigue due to her caregiver responsibilities.   Provided patient with information about available socialization opportunities within her nearby area. Patient was educated on [place] and [employer].   LCSW discussed coping skills for depression management. SW used empathetic and active and reflective listening, validated patient's feelings/concerns, and provided emotional support. LCSW provided self-care education to help manage her multiple health conditions and improve her mood.   Discussed plans with patient for ongoing care management follow up and provided patient with direct contact information for care management team  Assisted patient/caregiver with obtaining information about health plan benefits  Provided education and assistance to client regarding Advanced Directives.  A voluntary and extensive discussion about advanced care planning including explanation and discussion of advanced was undertaken with the patient and family.  Explanation regarding healthcare proxy and living will was reviewed and packet with forms with explanation of how to fill them out was given.    Provided education to patient/caregiver regarding level of care options.  Provided education to patient/caregiver about Hospice and/or Palliative Care services  Patient reports implementing appropriate self-care habits into her daily routine such as: drinking water, staying active around the house, taking her medications as prescribed, combating negative thoughts or emotions and staying connected with her family and friends. Positive reinforcement provided. Patient agreeable to contact [facility] today to inquire about their two caregiver respite support programs, support group and diaper bank  service.   Contact information provided for this resource: Address: [address], [place], [place] [address] Phone: [phone]    Follow Up Plan: SW will follow up with patient by phone over the next quarter  Eula Fried, BSW, MSW, [email or web address] Phone: [phone]

## 2020-07-02 NOTE — Telephone Encounter (Signed)
consent

## 2020-07-20 ENCOUNTER — Other Ambulatory Visit: Payer: PPO

## 2020-07-20 ENCOUNTER — Other Ambulatory Visit: Payer: Self-pay

## 2020-07-20 DIAGNOSIS — Z20822 Contact with and (suspected) exposure to covid-19: Secondary | ICD-10-CM | POA: Diagnosis not present

## 2020-07-23 LAB — NOVEL CORONAVIRUS, NAA: SARS-CoV-2, NAA: NOT DETECTED

## 2020-08-11 DIAGNOSIS — R1314 Dysphagia, pharyngoesophageal phase: Secondary | ICD-10-CM | POA: Diagnosis not present

## 2020-08-11 DIAGNOSIS — G501 Atypical facial pain: Secondary | ICD-10-CM | POA: Diagnosis not present

## 2020-08-11 DIAGNOSIS — Z85819 Personal history of malignant neoplasm of unspecified site of lip, oral cavity, and pharynx: Secondary | ICD-10-CM | POA: Diagnosis not present

## 2020-08-11 DIAGNOSIS — H93293 Other abnormal auditory perceptions, bilateral: Secondary | ICD-10-CM | POA: Diagnosis not present

## 2020-08-11 DIAGNOSIS — H6980 Other specified disorders of Eustachian tube, unspecified ear: Secondary | ICD-10-CM | POA: Diagnosis not present

## 2020-09-05 ENCOUNTER — Telehealth: Payer: Self-pay

## 2020-09-09 ENCOUNTER — Ambulatory Visit (INDEPENDENT_AMBULATORY_CARE_PROVIDER_SITE_OTHER): Payer: PPO

## 2020-09-09 DIAGNOSIS — I441 Atrioventricular block, second degree: Secondary | ICD-10-CM

## 2020-09-09 LAB — CUP PACEART REMOTE DEVICE CHECK
Battery Impedance: 237 Ohm
Battery Remaining Longevity: 129 mo
Battery Voltage: 2.78 V
Brady Statistic AP VP Percent: 0 %
Brady Statistic AP VS Percent: 17 %
Brady Statistic AS VP Percent: 0 %
Brady Statistic AS VS Percent: 83 %
Date Time Interrogation Session: 20220301053114
Implantable Lead Implant Date: 20061128
Implantable Lead Implant Date: 20061128
Implantable Lead Location: 753859
Implantable Lead Location: 753860
Implantable Lead Model: 4092
Implantable Lead Model: 4592
Implantable Pulse Generator Implant Date: 20161123
Lead Channel Impedance Value: 573 Ohm
Lead Channel Impedance Value: 753 Ohm
Lead Channel Pacing Threshold Amplitude: 0.5 V
Lead Channel Pacing Threshold Amplitude: 1 V
Lead Channel Pacing Threshold Pulse Width: 0.4 ms
Lead Channel Pacing Threshold Pulse Width: 0.4 ms
Lead Channel Setting Pacing Amplitude: 2 V
Lead Channel Setting Pacing Amplitude: 2.5 V
Lead Channel Setting Pacing Pulse Width: 0.4 ms
Lead Channel Setting Sensing Sensitivity: 2.8 mV

## 2020-09-18 NOTE — Progress Notes (Signed)
Remote pacemaker transmission.   

## 2020-09-22 ENCOUNTER — Telehealth: Payer: Self-pay

## 2020-09-22 NOTE — Telephone Encounter (Signed)
[facility] called and spoke to [employer], Industrial/product designer about the refill requested. Advised E3 1mg /Test 1mg  tab was requested and a refill error message appeared because the medication is not found in the system. She says it's a compounded medication. She says she will fax over the request. Fax number provided and asked to attention to Dr. Park Liter.

## 2020-09-22 NOTE — Progress Notes (Signed)
This encounter was created in error - please disregard.

## 2020-09-23 NOTE — Telephone Encounter (Signed)
Wendy Kelly patient

## 2020-10-01 DIAGNOSIS — I700 Atherosclerosis of aorta: Secondary | ICD-10-CM | POA: Insufficient documentation

## 2020-10-10 ENCOUNTER — Ambulatory Visit (INDEPENDENT_AMBULATORY_CARE_PROVIDER_SITE_OTHER): Payer: PPO | Admitting: Licensed Clinical Social Worker

## 2020-10-10 DIAGNOSIS — I480 Paroxysmal atrial fibrillation: Secondary | ICD-10-CM | POA: Diagnosis not present

## 2020-10-10 DIAGNOSIS — I951 Orthostatic hypotension: Secondary | ICD-10-CM

## 2020-10-10 DIAGNOSIS — E039 Hypothyroidism, unspecified: Secondary | ICD-10-CM

## 2020-10-10 DIAGNOSIS — Z636 Dependent relative needing care at home: Secondary | ICD-10-CM

## 2020-10-10 NOTE — Patient Instructions (Signed)
Licensed Clinical Social Worker Visit Information  Goals we discussed today:  Goals Addressed            This Visit's Progress   . SW-Manage My Emotions       Timeframe:  Long-Range Goal Priority:  Medium Start Date:   10/10/20                  Expected End Date: 01/09/21              Follow Up Date-11/27/20   - begin personal counseling - call and visit an old friend - check out volunteer opportunities - join a support group - laugh; watch a funny movie or comedian - learn and use visualization or guided imagery - perform a random act of kindness - practice relaxation or meditation daily - start or continue a personal journal - talk about feelings with a friend, family or spiritual advisor - practice positive thinking and self-talk    Why is this important?    When you are stressed, down or upset, your body reacts too.   For example, your blood pressure may get higher; you may have a headache or stomachache.   When your emotions get the best of you, your body's ability to fight off cold and flu gets weak.   These steps will help you manage your emotions.    Current Barriers:   Financial constraints   Limited social support  Level of care concerns  ADL IADL limitations  Social Isolation  Limited access to caregiver   Clinical Social Work Clinical Goal(s):   Over the next 120 days, patient/caregiver will work with SW to address concerns related to lack of support/resource connection. LCSW will assist patient in gaining additional support/resource connection and community resource education in order to maintain health and mental health appropriately    Over the next 120 days, patient will demonstrate improved adherence to self care as evidenced by implementing healthy self-care into her daily routine such as: attending all medical appointments, deep breathing exercises, taking time for self and self-reflection, taking medications as prescribed, drinking water and  daily exercise to improve mobility.    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 Patient complains of ongoing fatigue due to her caregiver responsibilities.   Provided patient with information about available socialization opportunities within her nearby area. Patient was educated on [place] and [employer]. Patient admits that she contacted [facility] and spoke with a representative about their caregiver support programs but did not sign her up for them. Patient is agreeable to contact them again to get her on the wait list for C.H.O.R.E and possibly enroll her mother into their care giver relief program that offers a stipend to help pay for an aide in the home.   LCSW discussed coping skills for depression management. SW used empathetic and active and reflective listening, validated patient's feelings/concerns, and provided emotional support. LCSW provided self-care education to help manage her multiple health conditions and improve her mood.   Discussed plans with patient for ongoing care management follow up and provided patient with direct contact information for care management team  Assisted patient/caregiver  with obtaining information about health plan benefits  Provided education and assistance to client regarding Advanced Directives.  A voluntary and extensive discussion about advanced care planning including explanation and discussion of advanced was undertaken with the patient and family.  Explanation regarding healthcare proxy and living will was reviewed and packet  with forms with explanation of how to fill them out was given.    Patient was informed that current CCM LCSW will be leaving position next month and her next CCM Social Work follow up visit will be with another LCSW. Patient was appreciative of support provided and receptive to news  Provided education to patient/caregiver regarding level of care options.  Provided education to patient/caregiver about Hospice and/or Palliative Care services  Patient reports implementing appropriate self-care habits into her daily routine such as: drinking water, staying active around the house, taking her medications as prescribed, combating negative thoughts or emotions and staying connected with her family and friends. Positive reinforcement provided. Patient agreeable to contact [facility] today to inquire about their two caregiver respite support programs, support group and diaper bank service.   Contact information provided for this resource: Address: [address], [place], [place] [address] Phone: [phone]       Eula Fried, [place], MSW, [email or web address] Phone: [phone]

## 2020-10-10 NOTE — Chronic Care Management (AMB) (Signed)
Chronic Care Management    Clinical Social Work Note  10/10/2020 Name: Wendy Kelly MRN: [id number] DOB: 1955-07-16  Wendy Kelly is a 65 y.o. year old female who is a primary care patient of Valerie Roys, DO. The CCM team was consulted to assist the patient with chronic disease management and/or care coordination needs related to: Caregiver Stress.   Engaged with patient by telephone for follow up visit in response to provider referral for social work chronic care management and care coordination services.   Consent to Services:  The patient was given the following information about Chronic Care Management services today, agreed to services, and gave verbal consent: 1. CCM service includes personalized support from designated clinical staff supervised by the primary care provider, including individualized plan of care and coordination with other care providers 2. 24/7 contact phone numbers for assistance for urgent and routine care needs. 3. Service will only be billed when office clinical staff spend 20 minutes or more in a month to coordinate care. 4. Only one practitioner may furnish and bill the service in a calendar month. 5.The patient may stop CCM services at any time (effective at the end of the month) by phone call to the office staff. 6. The patient will be responsible for cost sharing (co-pay) of up to 20% of the service fee (after annual deductible is met). Patient agreed to services and consent obtained.  Patient agreed to services and consent obtained.   Assessment: Review of patient past medical history, allergies, medications, and health status, including review of relevant consultants reports was performed today as part of a comprehensive evaluation and provision of chronic care management and care coordination services.     SDOH (Social Determinants of Health) assessments and interventions performed:    Advanced Directives Status: Not addressed in this encounter.  CCM Care  Plan  Allergies  Allergen Reactions  . Cefdinir Swelling    Swelling in mouth   . Cephalosporins Swelling  . Dairy Aid [Lactase] Swelling    Milk and milk products cause swelling and tingling of tongue  . Flagyl [Metronidazole] Swelling    Mouth and throat and ears go red and burn  . Lactose Intolerance (Gi) Other (See Comments)    GI upset.  Eats only gluten free  . Penicillins Rash    Rash/hives  . Phenylephrine-Guaifenesin Anaphylaxis    Night terrors  . Sulfa Antibiotics Rash and Hives    Other reaction(s): Unknown   . Sulfonamide Derivatives Rash    Rash/itching.  From head to her toes, the itching was horrible. Penicillin is the worst  . Tape Rash    Adhesives all cause a problem severely.  It doesn't matter if it is paper tape or not.  Marland Kitchen Dexilant [Dexlansoprazole]     Pressure in head like head is in a vice and being squeezed  . Entex Lq [Phenylephrine-Guaifenesin]     Night terrors  . Other Other (See Comments)    Flu vaccine:  Weakness, dizziness, tia type symptoms.    . Oxycodone Nausea And Vomiting    And itching  . Succinylcholine Other (See Comments)    Trouble waking up  . Vancomycin Rash  . Clarithromycin Itching and Other (See Comments)  . Tetracycline Other (See Comments)    Unkown  . Codeine Nausea And Vomiting  . Guaifenesin & Derivatives Other (See Comments)    Night terrors  . Lactobacillus Itching  . Naproxen Sodium     Itching/rash  . Pseudoephedrine Rash  .  Tetracyclines & Related Other (See Comments)    Was taking flagyl at the same time; unsure which caused the swelling  Swelling of throat. Pharmacist states that he believes it was the flagyl and not the tetracycline  . Wheat Bran Other (See Comments)    Upset stomach. Now eats gluten free    Outpatient Encounter Medications as of 10/10/2020  Medication Sig  . aspirin 81 MG tablet Take 81 mg by mouth daily.  Marland Kitchen b complex vitamins tablet Take 2 tablets by mouth daily.   Marland Kitchen BLACK  ELDERBERRY,BERRY-FLOWER, PO Take 2 capsules by mouth daily.   . Calcium Carb-Cholecalciferol (CALCIUM 500 + D3 PO) Take 2 Doses by mouth daily.   Marland Kitchen CRANBERRY SOFT PO Take 2 each by mouth daily.   . Digestive Enzymes (DIGESTIVE ENZYME PO) Take 1-2 capsules by mouth daily.  . fexofenadine (ALLEGRA) 180 MG tablet Take 180 mg by mouth daily.  . montelukast (SINGULAIR) 10 MG tablet Take 10 mg by mouth at bedtime.   Marland Kitchen PRESCRIPTION MEDICATION Place 1 tablet vaginally 3 (three) times a week. E3 70m and Testosterone 136mcompound  . SYNTHROID 75 MCG tablet TAKE 1 TABLET BY MOUTH EVERY DAY   Facility-Administered Encounter Medications as of 10/10/2020  Medication  . 0.9 %  sodium chloride infusion    Patient Active Problem List   Diagnosis Date Noted  . Aortic atherosclerosis (HCSylvester03/23/2022  . Idiopathic medial aortopathy and arteriopathy (HCEl[place]08/25/2021  . Genitourinary bleeding 12/04/2019  . COVID-19 virus infection 09/11/2019  . Encounter for screening colonoscopy   . Polyp of descending colon   . Hypothyroidism 06/27/2018  . Dehydration 08/11/2017  . Squamous cell carcinoma of oropharynx (HCBarlow12/10/2016  . Goals of care, counseling/discussion 06/14/2017  . Atrial fibrillation (HCBraman10/03/2017  . Problems with swallowing and mastication   . TIA (transient ischemic attack) 05/02/2015  . Ascending aortic aneurysm (HCHarrington Park04/14/2014  . AV block, 2nd degree   . Expressive aphasia 06/11/2011  . Orthostatic hypotension 06/11/2011  . 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 08/20/2009   Care Plan : General Social Work (Adult)  Updates made by JoGreg CutterLCSW since 10/10/2020 12:00 AM    Problem: Caregiver Stress     Long-Range Goal: Caregiver Coping Optimized   Start Date: 10/10/2020  Note:   Timeframe:  Long-Range Goal Priority:  Medium Start Date:   10/10/20                  Expected End Date: 01/09/21              Follow Up Date-11/27/20   - begin personal counseling - call and visit an old friend -  check out volunteer opportunities - join a support group - laugh; watch a funny movie or comedian - learn and use visualization or guided imagery - perform a random act of kindness - practice relaxation or meditation daily - start or continue a personal journal - talk about feelings with a friend, family or spiritual advisor - practice positive thinking and self-talk    Why is this important?    When you are stressed, down or upset, your body reacts too.   For example, your blood pressure may get higher; you may have a headache or stomachache.   When your emotions get the best of you, your body's ability to fight off cold and flu gets weak.   These steps will help you manage your emotions.    Current Barriers:   Financial constraints   Limited  social support  Level of care concerns  ADL IADL limitations  Social Isolation  Limited access to caregiver   Clinical Social Work Clinical Goal(s):   Over the next 120 days, patient/caregiver will work with SW to address concerns related to lack of support/resource connection. LCSW will assist patient in gaining additional support/resource connection and community resource education in order to maintain health and mental health appropriately    Over the next 120 days, patient will demonstrate improved adherence to self care as evidenced by implementing healthy self-care into her daily routine such as: attending all medical appointments, deep breathing exercises, taking time for self and self-reflection, taking medications as prescribed, drinking water and daily exercise to improve mobility.    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 Patient complains of ongoing fatigue due to her caregiver responsibilities.   Provided patient with information about available socialization opportunities within her nearby area. Patient was educated on [place] and [employer]. Patient admits that she contacted [facility] and spoke with a representative about their caregiver support programs but did not sign her up for them. Patient is agreeable to contact them again to get her on the wait list for C.H.O.R.E and possibly enroll her mother into their care giver relief program that offers a stipend to help pay for an aide in the home.   LCSW discussed coping skills for depression management. SW used empathetic and active and reflective listening, validated patient's feelings/concerns, and provided emotional support. LCSW provided self-care education to help manage her multiple health conditions and improve her mood.   Discussed plans with patient for ongoing care management follow up and provided patient with direct contact information for care management team  Assisted patient/caregiver with obtaining information about health plan benefits  Provided education and assistance to client regarding Advanced Directives.  A voluntary and extensive discussion about advanced care planning including explanation and discussion of advanced was undertaken with the patient and family.  Explanation regarding healthcare proxy and living will was reviewed and packet with forms with explanation of how to fill them out was given.    Patient was informed that current CCM LCSW will be leaving position next month and her next CCM Social Work follow up visit will be with another LCSW. Patient was appreciative of support provided and receptive to news  Provided education to patient/caregiver regarding level of care options.  Provided  education to patient/caregiver about Hospice and/or Palliative Care services  Patient reports implementing appropriate self-care habits into her daily routine such as: drinking water, staying active around the house, taking her medications as prescribed, combating negative thoughts or emotions and staying connected with her family and friends. Positive reinforcement provided. Patient agreeable to contact [facility] today to inquire about their two caregiver respite support programs, support group and diaper bank service.   Contact information provided for this resource: Address: [place], [place], [place] [address] Phone: [phone]   Task: Recognize and Manage Caregiver Stress   Note:   Care Management Activities:    - active listening utilized - caregiver stress acknowledged - complementary therapy use encouraged - consideration of in-home help  encouraged - decision-making supported - engagement with primary care provider encouraged - healthy lifestyle promoted - mental health treatment facilitated - participation in support group encouraged - positive reinforcement provided - self-reflection promoted - social and community activities encouraged - spiritual activity use promoted - support group information provided - verbalization of feelings encouraged    Notes:       Follow Up Plan: SW will follow up with patient by phone over the next quarter      Eula Fried, BSW, MSW, [email or web address] Phone: [phone]

## 2020-10-17 ENCOUNTER — Other Ambulatory Visit: Payer: Self-pay | Admitting: Otolaryngology

## 2020-10-17 DIAGNOSIS — G501 Atypical facial pain: Secondary | ICD-10-CM | POA: Diagnosis not present

## 2020-10-17 DIAGNOSIS — J329 Chronic sinusitis, unspecified: Secondary | ICD-10-CM

## 2020-10-17 DIAGNOSIS — R519 Headache, unspecified: Secondary | ICD-10-CM

## 2020-10-17 DIAGNOSIS — J301 Allergic rhinitis due to pollen: Secondary | ICD-10-CM | POA: Diagnosis not present

## 2020-11-05 ENCOUNTER — Ambulatory Visit
Admission: RE | Admit: 2020-11-05 | Discharge: 2020-11-05 | Disposition: A | Discharge status: Home / Self Care | Payer: PPO | Source: Ambulatory Visit | Attending: Otolaryngology | Admitting: Otolaryngology

## 2020-11-05 ENCOUNTER — Other Ambulatory Visit: Payer: Self-pay

## 2020-11-05 DIAGNOSIS — R519 Headache, unspecified: Secondary | ICD-10-CM | POA: Insufficient documentation

## 2020-11-05 DIAGNOSIS — J329 Chronic sinusitis, unspecified: Secondary | ICD-10-CM | POA: Insufficient documentation

## 2020-11-05 LAB — POCT I-STAT CREATININE: Creatinine, Ser: 0.7 mg/dL (ref 0.44–1.00)

## 2020-11-05 MED ORDER — IOHEXOL 300 MG/ML  SOLN
75.0000 mL | Freq: Once | INTRAMUSCULAR | Status: AC | PRN
  Administered 2020-11-05: 75 mL via INTRAVENOUS

## 2020-11-27 ENCOUNTER — Ambulatory Visit (INDEPENDENT_AMBULATORY_CARE_PROVIDER_SITE_OTHER): Payer: PPO | Admitting: Licensed Clinical Social Worker

## 2020-11-27 DIAGNOSIS — E039 Hypothyroidism, unspecified: Secondary | ICD-10-CM | POA: Diagnosis not present

## 2020-11-27 DIAGNOSIS — Z636 Dependent relative needing care at home: Secondary | ICD-10-CM

## 2020-11-27 NOTE — Patient Instructions (Signed)
Visit Information  Goals Addressed            This Visit's Progress   . SW-Manage My Emotions   On track    Timeframe:  Long-Range Goal Priority:  Medium Start Date:   10/10/20                  Expected End Date: 03/11/21              Follow Up Date-02/23/21   Patient Goals: . Attend all scheduled appointments with providers . Contact clinic with any questions or concerns that may arise . Utilize strategies discussed to assist with management of symptoms       Patient verbalizes understanding of instructions provided today and agrees to view in [place].   Telephone follow up appointment with care management team member scheduled for:02/23/21  Christa See, MSW, [email or web address] Phone [phone] 4:22 PM

## 2020-11-27 NOTE — Chronic Care Management (AMB) (Signed)
Chronic Care Management    Clinical Social Work Note  11/27/2020 Name: Wendy Kelly MRN: [id number] DOB: March 06, 1956  Wendy Kelly is a 65 y.o. year old female who is a primary care patient of Wendy Roys, DO. The CCM team was consulted to assist the patient with chronic disease management and/or care coordination needs related to: Caregiver Stress.   Engaged with patient by telephone for follow up visit in response to provider referral for social work chronic care management and care coordination services.   Consent to Services:  The patient was given information about Chronic Care Management services, agreed to services, and gave verbal consent prior to initiation of services.  Please see initial visit note for detailed documentation.   Patient agreed to services and consent obtained.   Assessment: Patient is engaged in conversation, continues to maintain positive progress with care plan goals. Patient has agreed to follow up on provided resources to assist with management of caregiver stress. CCM LCSW has provided additional information on local respite care services for elderly mother. Strategies to assist in promoting relaxation identified. See Care Plan below for interventions and patient self-care actives. Recent life changes [name]: Caregiver stress Recommendation: Patient may benefit from, and is in agreement to compliance with care plan.  Follow up Plan: Patient would like continued follow-up.  CCM LCSW will follow up with patient on 02/23/21. Patient will call office if needed prior to next encounter.  SDOH (Social Determinants of Health) assessments and interventions performed:    Advanced Directives Status: Not addressed in this encounter.  CCM Care Plan  Allergies  Allergen Reactions  . Cefdinir Swelling    Swelling in mouth   . Cephalosporins Swelling  . Dairy Aid [Lactase] Swelling    Milk and milk products cause swelling and tingling of tongue  . Flagyl  [Metronidazole] Swelling    Mouth and throat and ears go red and burn  . Lactose Intolerance (Gi) Other (See Comments)    GI upset.  Eats only gluten free  . Penicillins Rash    Rash/hives  . Phenylephrine-Guaifenesin Anaphylaxis    Night terrors  . Sulfa Antibiotics Rash and Hives    Other reaction(s): Unknown   . Sulfonamide Derivatives Rash    Rash/itching.  From head to her toes, the itching was horrible. Penicillin is the worst  . Tape Rash    Adhesives all cause a problem severely.  It doesn't matter if it is paper tape or not.  Marland Kitchen Dexilant [Dexlansoprazole]     Pressure in head like head is in a vice and being squeezed  . Entex Lq [Phenylephrine-Guaifenesin]     Night terrors  . Other Other (See Comments)    Flu vaccine:  Weakness, dizziness, tia type symptoms.    . Oxycodone Nausea And Vomiting    And itching  . Succinylcholine Other (See Comments)    Trouble waking up  . Vancomycin Rash  . Clarithromycin Itching and Other (See Comments)  . Tetracycline Other (See Comments)    Unkown  . Codeine Nausea And Vomiting  . Guaifenesin & Derivatives Other (See Comments)    Night terrors  . Lactobacillus Itching  . Naproxen Sodium     Itching/rash  . Pseudoephedrine Rash  . Tetracyclines & Related Other (See Comments)    Was taking flagyl at the same time; unsure which caused the swelling  Swelling of throat. Pharmacist states that he believes it was the flagyl and not the tetracycline  . Wheat Bran Other (  See Comments)    Upset stomach. Now eats gluten free    Outpatient Encounter Medications as of 11/27/2020  Medication Sig  . aspirin 81 MG tablet Take 81 mg by mouth daily.  Marland Kitchen b complex vitamins tablet Take 2 tablets by mouth daily.   Marland Kitchen BLACK ELDERBERRY,BERRY-FLOWER, PO Take 2 capsules by mouth daily.   . Calcium Carb-Cholecalciferol (CALCIUM 500 + D3 PO) Take 2 Doses by mouth daily.   Marland Kitchen CRANBERRY SOFT PO Take 2 each by mouth daily.   . Digestive Enzymes (DIGESTIVE  ENZYME PO) Take 1-2 capsules by mouth daily.  . fexofenadine (ALLEGRA) 180 MG tablet Take 180 mg by mouth daily.  . montelukast (SINGULAIR) 10 MG tablet Take 10 mg by mouth at bedtime.   Marland Kitchen PRESCRIPTION MEDICATION Place 1 tablet vaginally 3 (three) times a week. E3 1mg  and Testosterone 1mg  compound  . SYNTHROID 75 MCG tablet TAKE 1 TABLET BY MOUTH EVERY DAY   Facility-Administered Encounter Medications as of 11/27/2020  Medication  . 0.9 %  sodium chloride infusion    Patient Active Problem List   Diagnosis Date Noted  . Aortic atherosclerosis ([place]) 10/01/2020  . 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 Genitourinary bleeding 12/04/2019  . COVID-19 virus infection 09/11/2019  . Encounter for screening colonoscopy   . Polyp of descending colon   . Hypothyroidism 06/27/2018  . Dehydration 08/11/2017  . 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 Goals of care, counseling/discussion 06/14/2017  . Atrial fibrillation ([place]) 04/19/2017  . Problems with swallowing and mastication   . TIA (transient ischemic attack) 05/02/2015  . Ascending aortic aneurysm ([place]) 10/23/2012  . AV block, 2nd degree   . Expressive aphasia 06/11/2011  . Orthostatic hypotension 06/11/2011  . 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[employer] 08/20/2009    Conditions to be addressed/monitored: Stress; Lacks knowledge of community resource: Respite Care  Care Plan : General Social Work (Adult)  Updates made by Rebekah Chesterfield, LCSW since 11/27/2020 12:00 AM    Problem: Caregiver Stress     Long-Range Goal: Caregiver Coping Optimized   Start Date: 10/10/2020  This Visit's Progress: On track  Note:   Timeframe:  Long-Range Goal Priority:  Medium Start Date:   10/10/20                  Expected End Date: 03/11/21              Follow Up Date-02/23/21   Current Barriers:   Financial constraints   Limited social support  Level of care concerns  ADL IADL limitations  Social Isolation  Limited  access to caregiver   Clinical Social Work Clinical Goal(s):   Over the next 120 days, patient/caregiver will work with SW to address concerns related to lack of support/resource connection. LCSW will assist patient in gaining additional support/resource connection and community resource education in order to maintain health and mental health appropriately    Over the next 120 days, patient will demonstrate improved adherence to self care as evidenced by implementing healthy self-care into her daily routine such as: attending all medical appointments, deep breathing exercises, taking time for self and self-reflection, taking medications as prescribed, drinking water and daily exercise to improve mobility.    Over the next 120 days, patient will demonstrate improved health management independence as evidenced by implementing healthy self-care skills and positive support/resources into her daily routine to help cope with stressors and improve overall health and well-being    Interventions: . Patient interviewed  and appropriate assessments performedPatient is a caregiver to her mother and is currently experiencing caregiver burnout. She receives very limited support (Father and sister has passed away) Patient receives support from spouse and adult daughter; however, he is employed and daughter is caring for patient's minor granddaughter. Patient complains of ongoing fatigue due to her caregiver responsibilities.  . Patient reports that she has not yet contacted resources provided at last visit. Feels conflicted about mother's response to having an aid in the home. Resources included Jefm Bryant and [employer]. Patient has established Life Alert services to promote mother's safety . LCSW acknowledged caregiver stress. Validation and encouragement was provided. Strategies to assist in coping for depression management identified. LCSW used empathetic and active and reflective listening,  validated patient's feelings/concerns, and provided emotional support. LCSW provided self-care education to help manage her multiple health conditions and improve her mood.  . Patient is open to resources that provide respite care, as needed. Feels a day out of the month would greatly benefit pt's mental and physical health. LCSW provided patient with local resources, per email . Discussed plans with patient for ongoing care management follow up and provided patient with direct contact information for care management team  Patient Goals: . Attend all scheduled appointments with providers . Contact clinic with any questions or concerns that may arise . Utilize strategies discussed to assist with management of symptoms      Christa See, MSW, [email or web address] Phone [phone] 4:20 PM

## 2020-12-09 ENCOUNTER — Ambulatory Visit (INDEPENDENT_AMBULATORY_CARE_PROVIDER_SITE_OTHER): Payer: PPO

## 2020-12-09 DIAGNOSIS — Z85819 Personal history of malignant neoplasm of unspecified site of lip, oral cavity, and pharynx: Secondary | ICD-10-CM | POA: Diagnosis not present

## 2020-12-09 DIAGNOSIS — I441 Atrioventricular block, second degree: Secondary | ICD-10-CM | POA: Diagnosis not present

## 2020-12-09 DIAGNOSIS — R519 Headache, unspecified: Secondary | ICD-10-CM | POA: Diagnosis not present

## 2020-12-09 DIAGNOSIS — J301 Allergic rhinitis due to pollen: Secondary | ICD-10-CM | POA: Diagnosis not present

## 2020-12-09 LAB — CUP PACEART REMOTE DEVICE CHECK
Battery Impedance: 261 Ohm
Battery Remaining Longevity: 126 mo
Battery Voltage: 2.78 V
Brady Statistic AP VP Percent: 0 %
Brady Statistic AP VS Percent: 17 %
Brady Statistic AS VP Percent: 0 %
Brady Statistic AS VS Percent: 83 %
Date Time Interrogation Session: 20220531053643
Implantable Lead Implant Date: 20061128
Implantable Lead Implant Date: 20061128
Implantable Lead Location: 753859
Implantable Lead Location: 753860
Implantable Lead Model: 4092
Implantable Lead Model: 4592
Implantable Pulse Generator Implant Date: 20161123
Lead Channel Impedance Value: 556 Ohm
Lead Channel Impedance Value: 753 Ohm
Lead Channel Pacing Threshold Amplitude: 0.5 V
Lead Channel Pacing Threshold Amplitude: 1 V
Lead Channel Pacing Threshold Pulse Width: 0.4 ms
Lead Channel Pacing Threshold Pulse Width: 0.4 ms
Lead Channel Setting Pacing Amplitude: 2 V
Lead Channel Setting Pacing Amplitude: 2.5 V
Lead Channel Setting Pacing Pulse Width: 0.4 ms
Lead Channel Setting Sensing Sensitivity: 2.8 mV

## 2020-12-23 DIAGNOSIS — Z961 Presence of intraocular lens: Secondary | ICD-10-CM | POA: Diagnosis not present

## 2021-01-01 NOTE — Progress Notes (Signed)
Remote pacemaker transmission.   

## 2021-01-06 DIAGNOSIS — L821 Other seborrheic keratosis: Secondary | ICD-10-CM | POA: Diagnosis not present

## 2021-01-06 DIAGNOSIS — D225 Melanocytic nevi of trunk: Secondary | ICD-10-CM | POA: Diagnosis not present

## 2021-01-06 DIAGNOSIS — D2271 Melanocytic nevi of right lower limb, including hip: Secondary | ICD-10-CM | POA: Diagnosis not present

## 2021-01-06 DIAGNOSIS — D2261 Melanocytic nevi of right upper limb, including shoulder: Secondary | ICD-10-CM | POA: Diagnosis not present

## 2021-01-06 DIAGNOSIS — D2262 Melanocytic nevi of left upper limb, including shoulder: Secondary | ICD-10-CM | POA: Diagnosis not present

## 2021-01-06 DIAGNOSIS — L570 Actinic keratosis: Secondary | ICD-10-CM | POA: Diagnosis not present

## 2021-01-06 DIAGNOSIS — Z85828 Personal history of other malignant neoplasm of skin: Secondary | ICD-10-CM | POA: Diagnosis not present

## 2021-01-20 ENCOUNTER — Other Ambulatory Visit: Payer: Self-pay | Admitting: Cardiothoracic Surgery

## 2021-01-20 DIAGNOSIS — I712 Thoracic aortic aneurysm, without rupture, unspecified: Secondary | ICD-10-CM

## 2021-01-28 ENCOUNTER — Other Ambulatory Visit: Payer: Self-pay | Admitting: Family Medicine

## 2021-02-23 ENCOUNTER — Telehealth: Payer: Self-pay

## 2021-02-23 ENCOUNTER — Telehealth: Payer: Self-pay | Admitting: Licensed Clinical Social Worker

## 2021-02-23 NOTE — Telephone Encounter (Signed)
    Clinical Social Work  Chronic Care Management   Phone Outreach    02/23/2021 Name: Wendy Kelly MRN: [id number] DOB: 12-24-1955  Wendy Kelly is a 65 y.o. year old female who is a primary care patient of Valerie Roys, DO .   Reason for referral: Caregiver Stress.    F/U phone call today to assess needs, progress and barriers with care plan goals.   Telephone outreach was unsuccessful A HIPPA compliant phone message was left for the patient providing contact information and requesting a return call.   Plan:CCM LCSW will wait for return call. If no return call is received, Will reach out to patient again in the next 30 days .   Review of patient status, including review of consultants reports, relevant laboratory and other test results, and collaboration with appropriate care team members and the patient's provider was performed as part of comprehensive patient evaluation and provision of care management services.    Christa See, MSW, [email or web address] Phone [phone] 4:43 PM

## 2021-02-25 ENCOUNTER — Ambulatory Visit (INDEPENDENT_AMBULATORY_CARE_PROVIDER_SITE_OTHER): Payer: PPO | Admitting: Licensed Clinical Social Worker

## 2021-02-25 DIAGNOSIS — Z636 Dependent relative needing care at home: Secondary | ICD-10-CM

## 2021-02-25 DIAGNOSIS — E039 Hypothyroidism, unspecified: Secondary | ICD-10-CM

## 2021-02-25 DIAGNOSIS — I480 Paroxysmal atrial fibrillation: Secondary | ICD-10-CM

## 2021-02-28 NOTE — Patient Instructions (Signed)
Visit Information   Goals Addressed             This Visit's Progress    SW-Manage My Emotions   On track    Timeframe:  Long-Range Goal Priority:  Medium Start Date:   10/10/20                  Expected End Date: 06/10/21              Follow Up Date-05/25/21   Patient Goals: Attend all scheduled appointments with providers Contact clinic with any questions or concerns that may arise Utilize strategies discussed to assist with management of symptoms        Patient verbalizes understanding of instructions provided today and agrees to view in [place].   Telephone follow up appointment with care management team member scheduled for:05/25/21  Christa See, MSW, [email or web address] Phone [phone] 12:15 AM

## 2021-02-28 NOTE — Chronic Care Management (AMB) (Signed)
Chronic Care Management    Clinical Social Work Note  02/28/2021 Name: Wendy Kelly MRN: [id number] DOB: May 28, 1956  Wendy Kelly is a 65 y.o. year old female who is a primary care patient of Valerie Roys, DO. The CCM team was consulted to assist the patient with chronic disease management and/or care coordination needs related to: Caregiver Stress.   Engaged with patient by telephone for follow up visit in response to provider referral for social work chronic care management and care coordination services.   Consent to Services:  The patient was given information about Chronic Care Management services, agreed to services, and gave verbal consent prior to initiation of services.  Please see initial visit note for detailed documentation.   Patient agreed to services and consent obtained.   Assessment: Review of patient past medical history, allergies, medications, and health status, including review of relevant consultants reports was performed today as part of a comprehensive evaluation and provision of chronic care management and care coordination services.     SDOH (Social Determinants of Health) assessments and interventions performed:    Advanced Directives Status: Not addressed in this encounter.  CCM Care Plan  Allergies  Allergen Reactions   Cefdinir Swelling    Swelling in mouth    Cephalosporins Swelling   Dairy Aid [Lactase] Swelling    Milk and milk products cause swelling and tingling of tongue   Flagyl [Metronidazole] Swelling    Mouth and throat and ears go red and burn   Lactose Intolerance (Gi) Other (See Comments)    GI upset.  Eats only gluten free   Penicillins Rash    Rash/hives   Phenylephrine-Guaifenesin Anaphylaxis    Night terrors   Sulfa Antibiotics Rash and Hives    Other reaction(s): Unknown    Sulfonamide Derivatives Rash    Rash/itching.  From head to her toes, the itching was horrible. Penicillin is the worst   Tape Rash    Adhesives  all cause a problem severely.  It doesn't matter if it is paper tape or not.   Dexilant [Dexlansoprazole]     Pressure in head like head is in a vice and being squeezed   Entex Lq [Phenylephrine-Guaifenesin]     Night terrors   Other Other (See Comments)    Flu vaccine:  Weakness, dizziness, tia type symptoms.     Oxycodone Nausea And Vomiting    And itching   Succinylcholine Other (See Comments)    Trouble waking up   Vancomycin Rash   Clarithromycin Itching and Other (See Comments)   Tetracycline Other (See Comments)    Unkown   Codeine Nausea And Vomiting   Guaifenesin & Derivatives Other (See Comments)    Night terrors   Lactobacillus Itching   Naproxen Sodium     Itching/rash   Pseudoephedrine Rash   Tetracyclines & Related Other (See Comments)    Was taking flagyl at the same time; unsure which caused the swelling  Swelling of throat. Pharmacist states that he believes it was the flagyl and not the tetracycline   Wheat Bran Other (See Comments)    Upset stomach. Now eats gluten free    Outpatient Encounter Medications as of 02/25/2021  Medication Sig   aspirin 81 MG tablet Take 81 mg by mouth daily.   b complex vitamins tablet Take 2 tablets by mouth daily.    BLACK ELDERBERRY,BERRY-FLOWER, PO Take 2 capsules by mouth daily.    Calcium Carb-Cholecalciferol (CALCIUM 500 + D3 PO) Take 2  Doses by mouth daily.    CRANBERRY SOFT PO Take 2 each by mouth daily.    Digestive Enzymes (DIGESTIVE ENZYME PO) Take 1-2 capsules by mouth daily.   fexofenadine (ALLEGRA) 180 MG tablet Take 180 mg by mouth daily.   montelukast (SINGULAIR) 10 MG tablet Take 10 mg by mouth at bedtime.    PRESCRIPTION MEDICATION Place 1 tablet vaginally 3 (three) times a week. 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   Genitourinary bleeding 12/04/2019   COVID-19 virus infection 09/11/2019   Encounter for screening colonoscopy    Polyp of descending colon    Hypothyroidism 06/27/2018   Dehydration 08/11/2017   Squamous cell carcinoma of oropharynx ([facility]) 06/14/2017   Goals of care, counseling/discussion 06/14/2017   Atrial fibrillation ([place]) 04/19/2017   Problems with swallowing and mastication    TIA (transient ischemic attack) 05/02/2015   Ascending aortic aneurysm ([place]) 10/23/2012   AV block, 2nd degree    Expressive aphasia 06/11/2011   Orthostatic hypotension 06/11/2011   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onic 08/20/2009    Conditions to be addressed/monitored: Anxiety and Caregiver Stres ; Limited social support  Care Plan : General Social Work (Adult)  Updates made by Rebekah Chesterfield, LCSW since 02/28/2021 12:00 AM     Problem: Caregiver Stress      Long-Range Goal: Caregiver Coping Optimized   Start Date: 10/10/2020  This Visit's Progress: On track  Recent Progress: On track  Priority: High  Note:   Timeframe:  Long-Range Goal Priority:  Medium Start Date:   10/10/20                  Expected End Date: 06/10/21              Follow Up Date-05/25/21   Current Barriers:   Financial constraints   Limited social support  Level of care concerns  ADL IADL limitations  Social Isolation  Limited access to caregiver   Clinical Social Work Clinical Goal(s):   Over the next 120 days, patient/caregiver will work with SW to address concerns related to lack of support/resource connection. LCSW will assist patient in gaining additional support/resource connection and community resource education in order to maintain health and mental health appropriately    Over the next 120 days, patient will demonstrate improved adherence to self care as evidenced by implementing  healthy self-care into her daily routine such as: attending all medical appointments, deep breathing exercises, taking time for self and self-reflection, taking medications as prescribed, drinking water and daily exercise to improve mobility.    Over the next 120 days, patient will demonstrate improved health management independence as evidenced by implementing healthy self-care skills and positive support/resources into her daily routine to help cope with stressors and improve overall health and well-being    Interventions: Patient interviewed and appropriate assessments performedPatient is a caregiver to her mother and is currently experiencing caregiver burnout. She receives very limited support (Father and sister has passed away) Patient receives support from spouse and adult daughter; however, he is employed and daughter is caring for patient's minor granddaughter. Patient complains of ongoing fatigue due to her caregiver responsibilities.  Patient reports that she  has not yet contacted resources provided at last visit. Feels conflicted about mother's response to having an aid in the home. Resources included Jefm Bryant and [employer]. Patient has established Life Alert services to promote mother's safety Patient reports difficulty managing stressors due to mother becoming ill resulting in a visit to the hospital. During that time, patient shared that she was not treated with respect which upset her mother. Currently, patient's mother is feeling better CCM LCSW provided validation and encouragement. Strategies to advocate for herself were discussed and patient agreed to complete patient satisfaction survey, via mail.  Patient has scheduled an appointment with [facility] to discuss Life at Home next week. Patient is hopeful program will strengthen support and assist with caregiver stress CCM LCSW discussed supportive resources to expand knowledge of caring for loved ones with memory  conditions. Patient was appreciative for the information. Patient identified self-care strategies that she will prioritize this week to assist in management of stress LCSW acknowledged caregiver stress. Validation and encouragement was provided. Strategies to assist in coping for depression management identified. LCSW used empathetic and active and reflective listening, validated patient's feelings/concerns, and provided emotional support. LCSW provided self-care education to help manage her multiple health conditions and improve her mood.  Patient is open to resources that provide respite care, as needed. Feels a day out of the month would greatly benefit pt's mental and physical health. LCSW provided patient with local resources, per email Discussed plans with patient for ongoing care management follow up and provided patient with direct contact information for care management team  Patient Goals: Attend all scheduled appointments with providers Contact clinic with any questions or concerns that may arise Utilize strategies discussed to assist with management of symptoms      Christa See, MSW, [email or web address] Phone [phone] 12:06 AM

## 2021-03-05 ENCOUNTER — Ambulatory Visit: Payer: PPO | Admitting: Cardiothoracic Surgery

## 2021-03-05 ENCOUNTER — Other Ambulatory Visit: Payer: PPO

## 2021-03-09 NOTE — Progress Notes (Signed)
Date:  03/10/2021   ID:  [name], DOB 09-08-1955, MRN [id number]  Patient Location:  [place] [address]   Provider location:   [facility], [place] office  PCP:  Valerie Roys, DO  Cardiologist:  Patsy Baltimore   Chief Complaint  Patient presents with   12 month follow up     "Doing well." Medications reviewed by the patient verbally.      History of Present Illness:    Wendy Kelly is a 65 y.o. 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, completed therapy She presents for routine followup of her chest pain and ascending aorta dilation  In follow-up today, some stressors at home Takes care of mom, age 5 Full time massage practice Takes care of grandson on thursdays  Last CT chest August 2021, details reviewed Pacer check May 2022 Scheduled for repeat CT scan tomorrow of aorta  Continues to have periodic low blood pressures, does get symptoms When 90 systolic , takes fluids and salt load  Echo in 2015 and 2018: called trileaflet aortic valve On review of images, still not well visualized  EKG personally reviewed by myself on todays visit Normal sinus rhythm rate 60 bpm no significant ST-T wave changes   Other past medical history reviewed covid 09/2019: had infusion Mom and husband had it as well Had antibody infusion at [place] blood pressure was lower following infusion  CT chest 02/05/2019 Stable dilatation of the ascending thoracic aorta with a maximum transverse  diameter 4.8 x 4.8 cm. No evident dissection. 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  Treated with Xarelto  Last CT chest December 2015, aorta 4.6 cm  No change compared to CT scan August 2011, at that time 4.6 x 4.8 cm  Son diagnosed with dilated ascending aorta 4.2 cm, he is out of state   CT scan in November 2013.  4.7 cm ascending aorta at that time   Past Medical History:  Diagnosis Date   AA (aortic aneurysm) ([place])    a. 04/2011 Echo: Ao Root: 4.1cm, Asc Ao 4.7cm.   Anxiety    AV block, 2nd degree    a. 05/2005 - s/p MDT Adapta ADDR01 Dual Chamber PPM   Bicuspid aortic valve    Cancer ([place])    Throat   Chest pain    a. 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   Deep vein thrombosis (DVT) of left lower extremity ([place]) 10/03/2017   Dehydration 08/11/2017   Depression    Expressive aphasia    a. ongoing since 04/2011 - seen by neurology - ? TIA vs. Migraine   Facial numbness    a. ongoing since 04/2011 - seen by neurology - ?  TIA vs. 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 04/2011 Echo: EF 55-65%, No RWMA, Gr 1 DD.   Thyroid disease    Past Surgical History:  Procedure Laterality Date   ABDOMINAL WALL MESH  REMOVAL     bladder tack     cataract Bilateral 2004   CHOLECYSTECTOMY  2000   COLONOSCOPY WITH PROPOFOL N/A  06/26/2019   Procedure: COLONOSCOPY WITH PROPOFOL;  Surgeon: Lucilla Lame, MD;  Location[facility] ENDOSCOPY;  Service: Endoscopy;  Laterality: N/A;   DILATION AND CURETTAGE OF UTERUS     DIRECT LARYNGOSCOPY Right 06/16/2017   Procedure: MICRO DIRECT LARYNGOSCOPY WITH BIOPSY OF RIGHT BASE OF TONGUE;  Surgeon: Carloyn Manner, MD;  Location: [facility] ORS;  Service: ENT;  Laterality: Right;   EP IMPLANTABLE DEVICE N/A 06/04/2015   Procedure: PPM Generator Changeout;  Surgeon: Deboraha Sprang, MD;  Location: [place] CV LAB;  Service: Cardiovascular;  Laterality: N/A;   ESOPHAGOGASTRODUODENOSCOPY (EGD) WITH PROPOFOL N/A 12/18/2015   Procedure: ESOPHAGOGASTRODUODENOSCOPY (EGD) WITH gastric biopsy and dilation;  Surgeon: Lucilla Lame, MD;  Location: [place];  Service: Endoscopy;  Laterality: N/A;   EYE SURGERY     INSERT / REPLACE / REMOVE PACEMAKER     IR FLUORO GUIDE PORT INSERTION RIGHT  06/22/2017   IR REMOVAL TUN ACCESS W/ PORT W/O FL MOD SED  04/14/2018   MOHS SURGERY  02/2018   PACEMAKER INSERTION  2006   Medtronic Adapta ADDR01   TONSILLECTOMY     TONSILLECTOMY     VAGINAL HYSTERECTOMY  2013   mad/cope      Allergies:   Cefdinir, Cephalosporins, Dairy aid [lactase], Flagyl [metronidazole], Lactose intolerance (gi), Penicillins, Phenylephrine-guaifenesin, Sulfa antibiotics, Sulfonamide derivatives, Tape, Dexilant [dexlansoprazole], Entex lq [phenylephrine-guaifenesin], Other, Oxycodone, Succinylcholine, Vancomycin, Clarithromycin, Tetracycline, Codeine, Guaifenesin & derivatives, Lactobacillus, Naproxen sodium, Pseudoephedrine, Tetracyclines & related, and Wheat bran   Social History   Tobacco Use   Smoking status: Never   Smokeless tobacco: Never   Tobacco comments:    tobacco use -no  Vaping Use   Vaping Use: Never used  Substance Use Topics   Alcohol use: No   Drug use: No     Current Outpatient Medications on File Prior to Visit  Medication Sig Dispense Refill    aspirin 81 MG tablet Take 81 mg by mouth daily.     azelastine (ASTELIN) 0.1 % nasal spray SMARTSIG:1-2 Spray(s) Both Nares Every 12 Hours PRN     b complex vitamins tablet Take 2 tablets by mouth daily.      BLACK ELDERBERRY,BERRY-FLOWER, PO Take 2 capsules by mouth daily.      Calcium Carb-Cholecalciferol (CALCIUM 500 + D3 PO) Take 2 Doses by mouth daily.      CRANBERRY SOFT PO Take 2 each by mouth daily.      Digestive Enzymes (DIGESTIVE ENZYME PO) Take 1-2 capsules by mouth daily.     PRESCRIPTION MEDICATION Place 1 tablet vaginally 3 (three) times a week. E3 '1mg'$  and Testosterone '1mg'$  compound     SYNTHROID 75 MCG tablet TAKE 1 TABLET  BY MOUTH EVERY DAY 90 tablet 3   Zinc 50 MG CAPS Take 50 mg by mouth daily.     fexofenadine (ALLEGRA) 180 MG tablet Take 180 mg by mouth daily. (Patient not taking: Reported on 03/10/2021)     montelukast (SINGULAIR) 10 MG tablet Take 10 mg by mouth at bedtime.  (Patient not taking: Reported on 03/10/2021)     Current Facility-Administered Medications on File Prior to Visit  Medication Dose Route Frequency Provider Last Rate Last Admin   0.9 %  sodium chloride infusion   Intravenous Once Sindy Guadeloupe, MD         Family Hx: The patient's family history includes Atrial fibrillation in her mother; Breast cancer (age of onset: 48) in her maternal aunt; COPD in her maternal grandmother and mother; Stroke in her father. There is no history of Ovarian cancer or Diabetes.  ROS:   Please see the history of present illness.    Review of Systems  Constitutional: Negative.   HENT: Negative.    Respiratory: Negative.    Cardiovascular: Negative.   Gastrointestinal: Negative.   Musculoskeletal: Negative.   Neurological: Negative.   Psychiatric/Behavioral: Negative.    All other systems reviewed and are negative.   Labs/Other Tests and Data Reviewed:    Recent Labs: 04/29/2020: ALT 25; BUN 13; Potassium 3.7; Sodium 139; TSH 2.140 05/20/2020: Hemoglobin 13.8;  Platelets 135 11/05/2020: Creatinine, Ser 0.70   Recent Lipid Panel Lab Results  Component Value Date/Time   CHOL 186 04/29/2020 10:37 AM   TRIG 54 04/29/2020 10:37 AM   HDL 78 04/29/2020 10:37 AM   CHOLHDL 2.8 06/30/2014 05:47 PM   LDLCALC 98 04/29/2020 10:37 AM    Wt Readings from Last 3 Encounters:  03/10/21 149 lb 4 oz (67.7 kg)  05/20/20 149 lb 14.4 oz (68 kg)  04/29/20 148 lb 3.2 oz (67.2 kg)     Exam:    Vital Signs: Vital signs may also be detailed in the HPI BP 100/64 (BP Location: Left Arm, Patient Position: Sitting, Cuff Size: Normal)   Pulse 60   Ht '5\' 3"'$  (1.6 m)   Wt 149 lb 4 oz (67.7 kg)   SpO2 98%   BMI 26.44 kg/m   Constitutional:  oriented to person, place, and time. No distress.  HENT:  Head: Grossly normal Eyes:  no discharge. No scleral icterus.  Neck: No JVD, no carotid bruits  Cardiovascular: Regular rate and rhythm, no murmurs appreciated Pulmonary/Chest: Clear to auscultation bilaterally, no wheezes or rails Abdominal: Soft.  no distension.  no tenderness.  Musculoskeletal: Normal range of motion Neurological:  normal muscle tone. Coordination normal. No atrophy Skin: Skin warm and dry Psychiatric: normal affect, pleasant   ASSESSMENT & PLAN:    Problem List Items Addressed This Visit       Cardiology Problems   Ascending aortic aneurysm ([place]) - Primary   Relevant Orders   EKG 12-Lead   Atrial fibrillation ([facility])   AV block, 2nd degree   Relevant Orders   EKG 12-Lead   Orthostatic hypotension     Other   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onic   Dilated ascending aorta Previously with stable aorta size 4.8 cm Repeat CT scan scheduled with follow-up with Dr. Cyndia Bent No changes to medications -Echocardiograms over the past 10 years reviewed, some detailing poor visualization of aortic valve and unable to exclude bicuspid, others detailing tricuspid aortic valve We are working with our CT reader to look at gated CT scan images and come to  a consensus on  whether there is bicuspid or tricuspid valve If still not definitive, may need TEE This was discussed with her  Chest pain None recently, atypical in nature  Shortness of breath Not an active issue at this time Recommend continued exercise program    Total encounter time more than 25 minutes  Greater than 50% was spent in counseling and coordination of care with the patient    Signed, Ida Rogue, [place] Office [address], [place], [place] [address]

## 2021-03-10 ENCOUNTER — Encounter: Payer: Self-pay | Admitting: Cardiovascular Disease

## 2021-03-10 ENCOUNTER — Ambulatory Visit: Payer: PPO | Admitting: Cardiovascular Disease

## 2021-03-10 ENCOUNTER — Ambulatory Visit (INDEPENDENT_AMBULATORY_CARE_PROVIDER_SITE_OTHER): Payer: PPO

## 2021-03-10 ENCOUNTER — Other Ambulatory Visit: Payer: Self-pay

## 2021-03-10 VITALS — BP 100/64 | HR 60 | Ht 63.0 in | Wt 149.2 lb

## 2021-03-10 DIAGNOSIS — I712 Thoracic aortic aneurysm, without rupture: Secondary | ICD-10-CM

## 2021-03-10 DIAGNOSIS — I480 Paroxysmal atrial fibrillation: Secondary | ICD-10-CM

## 2021-03-10 DIAGNOSIS — I441 Atrioventricular block, second degree: Secondary | ICD-10-CM

## 2021-03-10 DIAGNOSIS — I7121 Aneurysm of the ascending aorta, without rupture: Secondary | ICD-10-CM

## 2021-03-10 DIAGNOSIS — Z950 Presence of cardiac pacemaker: Secondary | ICD-10-CM

## 2021-03-10 DIAGNOSIS — I951 Orthostatic hypotension: Secondary | ICD-10-CM

## 2021-03-10 LAB — CUP PACEART REMOTE DEVICE CHECK
Battery Impedance: 261 Ohm
Battery Remaining Longevity: 125 mo
Battery Voltage: 2.79 V
Brady Statistic AP VP Percent: 0 %
Brady Statistic AP VS Percent: 18 %
Brady Statistic AS VP Percent: 0 %
Brady Statistic AS VS Percent: 82 %
Date Time Interrogation Session: 20220830053904
Implantable Lead Implant Date: 20061128
Implantable Lead Implant Date: 20061128
Implantable Lead Location: 753859
Implantable Lead Location: 753860
Implantable Lead Model: 4092
Implantable Lead Model: 4592
Implantable Pulse Generator Implant Date: 20161123
Lead Channel Impedance Value: 522 Ohm
Lead Channel Impedance Value: 732 Ohm
Lead Channel Pacing Threshold Amplitude: 0.5 V
Lead Channel Pacing Threshold Amplitude: 1.125 V
Lead Channel Pacing Threshold Pulse Width: 0.4 ms
Lead Channel Pacing Threshold Pulse Width: 0.4 ms
Lead Channel Setting Pacing Amplitude: 2 V
Lead Channel Setting Pacing Amplitude: 2.5 V
Lead Channel Setting Pacing Pulse Width: 0.4 ms
Lead Channel Setting Sensing Sensitivity: 2.8 mV

## 2021-03-10 NOTE — Patient Instructions (Addendum)
Medication Instructions:  No changes, please continue your current medications  If you need a refill on your cardiac medications before your next appointment, please call your pharmacy.   Lab work: No new labs needed  Testing/Procedures: No new testing needed  Follow-Up: At [facility], you and your health needs are our priority.  As part of our continuing mission to provide you with exceptional heart care, we have created designated Provider Care Teams.  These Care Teams include your primary Cardiologist (physician) and Advanced Practice Providers (APPs -  Physician Assistants and Nurse Practitioners) who all work together to provide you with the care you need, when you need it.  You will need a follow up appointment in 12 months  Providers on your designated Care Team:   Murray Hodgkins, NP Christell Faith, PA-C Marrianne Mood, PA-C Cadence [place], [place]  COVID-19 Vaccine Information can be found at: [email or web address] For questions related to vaccine distribution or appointments, please email [email or web address] or call [phone].

## 2021-03-11 ENCOUNTER — Ambulatory Visit: Payer: PPO | Admitting: Surgery

## 2021-03-11 ENCOUNTER — Encounter: Payer: Self-pay | Admitting: Oncology

## 2021-03-11 ENCOUNTER — Ambulatory Visit
Admission: RE | Admit: 2021-03-11 | Discharge: 2021-03-11 | Disposition: A | Discharge status: Home / Self Care | Payer: PPO | Source: Ambulatory Visit | Attending: Cardiothoracic Surgery | Admitting: Cardiothoracic Surgery

## 2021-03-11 DIAGNOSIS — I712 Thoracic aortic aneurysm, without rupture, unspecified: Secondary | ICD-10-CM

## 2021-03-11 MED ORDER — IOPAMIDOL (ISOVUE-370) INJECTION 76%
75.0000 mL | Freq: Once | INTRAVENOUS | Status: AC | PRN
  Administered 2021-03-11: 75 mL via INTRAVENOUS

## 2021-03-12 ENCOUNTER — Ambulatory Visit: Payer: PPO

## 2021-03-18 ENCOUNTER — Other Ambulatory Visit: Payer: Self-pay | Admitting: *Deleted

## 2021-03-18 ENCOUNTER — Other Ambulatory Visit: Payer: Self-pay

## 2021-03-18 ENCOUNTER — Ambulatory Visit: Payer: PPO | Admitting: Physician Assistant

## 2021-03-18 VITALS — BP 126/77 | HR 74 | Resp 20 | Ht 63.0 in | Wt 148.0 lb

## 2021-03-18 DIAGNOSIS — I712 Thoracic aortic aneurysm, without rupture, unspecified: Secondary | ICD-10-CM

## 2021-03-18 NOTE — Progress Notes (Signed)
[address]       [place] [address]             [phone]      PCP is Valerie Roys, DO Referring Provider is Minna Merritts, MD  Chief Complaint  Patient presents with   Thoracic Aortic Aneurysm    1 year f/u with Chest CTA    HPI: Annual visit with CTA surveillance scan for known asymptomatic fusiform ascending aneurysm stable at 4.8 cm since 2012.  She does not have hypertension.  She is a current non-smoker.  The patient is status post partial laryngectomy for cancer about 3 years ago.  Followed by oncology and ENT.   Hypotension at home but no symptoms. Dr. Rockey Situ has been following her for years.   No chest pains palpitations or edema. No SOB.    The CTA performed today shows stable fusiform ascending aneurysm without mural thrombus or ulceration.  Diameter stable at 4.6 cm, previously measured at 4.8 cm.   Past Medical History:  Diagnosis Date   AA (aortic aneurysm) ([place])    a. 04/2011 Echo: Ao Root: 4.1cm, Asc Ao 4.7cm.   Anxiety    AV block, 2nd degree    a. 05/2005 - s/p MDT Adapta ADDR01 Dual Chamber PPM   Bicuspid aortic valve    Cancer ([place])    Throat   Chest pain    a. 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   Deep vein thrombosis (DVT) of left lower extremity ([place]) 10/03/2017   Dehydration 08/11/2017   Depression    Expressive aphasia    a. ongoing since 04/2011 - seen by neurology - ? TIA vs. Migraine   Facial numbness    a. ongoing since 04/2011 - seen by neurology - ? TIA vs. 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 04/2011 Echo: EF 55-65%, No RWMA, Gr 1 DD.   Thyroid disease     Past Surgical History:   Procedure Laterality Date   ABDOMINAL WALL MESH  REMOVAL     bladder tack     cataract Bilateral 2004   CHOLECYSTECTOMY  2000   COLONOSCOPY WITH PROPOFOL N/A 06/26/2019   Procedure: COLONOSCOPY WITH PROPOFOL;  Surgeon: Lucilla Lame, MD;  Location[facility] ENDOSCOPY;  Service: Endoscopy;  Laterality: N/A;   DILATION AND CURETTAGE OF UTERUS     DIRECT LARYNGOSCOPY Right 06/16/2017   Procedure: MICRO DIRECT LARYNGOSCOPY WITH BIOPSY OF RIGHT BASE OF TONGUE;  Surgeon: Carloyn Manner, MD;  Location: [facility] ORS;  Service: ENT;  Laterality: Right;   EP IMPLANTABLE DEVICE N/A 06/04/2015   Procedure: PPM Generator Changeout;  Surgeon: Deboraha Sprang, MD;  Location: [place] CV LAB;  Service: Cardiovascular;  Laterality: N/A;   ESOPHAGOGASTRODUODENOSCOPY (EGD) WITH PROPOFOL N/A 12/18/2015   Procedure: ESOPHAGOGASTRODUODENOSCOPY (EGD) WITH gastric biopsy and dilation;  Surgeon: Lucilla Lame, MD;  Location: [place];  Service: Endoscopy;  Laterality: N/A;   EYE SURGERY     INSERT / REPLACE / REMOVE PACEMAKER     IR FLUORO GUIDE PORT INSERTION RIGHT  06/22/2017   IR REMOVAL TUN ACCESS W/ PORT W/O FL MOD SED  04/14/2018   MOHS SURGERY  02/2018   PACEMAKER INSERTION  2006   Medtronic Adapta ADDR01   TONSILLECTOMY     TONSILLECTOMY     VAGINAL HYSTERECTOMY  2013   mad/cope    Family History  Problem Relation Age of Onset   COPD Mother        alive @ 14   Atrial fibrillation Mother    Stroke Father        died @ 40   Breast cancer Maternal Aunt 70   COPD Maternal Grandmother    Ovarian cancer Neg Hx    Diabetes Neg Hx     Social History Social History   Tobacco Use   Smoking status: Never Smoker   Smokeless tobacco: Never Used   Tobacco comment: tobacco use -no  Vaping Use   Vaping Use: Never used  Substance Use Topics   Alcohol use: No   Drug use: No    Current Outpatient Medications  Medication Sig Dispense Refill   fexofenadine (ALLEGRA) 180 MG tablet Take 180 mg by  mouth daily.     aspirin 81 MG tablet Take 81 mg by mouth daily.     b complex vitamins tablet Take 2 tablets by mouth daily.      BLACK ELDERBERRY,BERRY-FLOWER, PO Take 2 capsules by mouth daily.      Calcium Carb-Cholecalciferol (CALCIUM 500 + D3 PO) Take 2 Doses by mouth daily.      CRANBERRY SOFT PO Take 2 each by mouth daily.      Digestive Enzymes (DIGESTIVE ENZYME PO) Take 1-2 capsules by mouth daily.     ipratropium (ATROVENT) 0.03 % nasal spray Place 2 sprays into both nostrils every 12 (twelve) hours.     levothyroxine (SYNTHROID) 75 MCG tablet Take 1 tablet (75 mcg total) by mouth daily. BRAND NAME SYNTHROID NECESSARY 90 tablet 3   montelukast (SINGULAIR) 10 MG tablet Take 10 mg by mouth at bedtime.      PRESCRIPTION MEDICATION Place 1 tablet vaginally 3 (three) times a week. E3 '1mg'$  and Testosterone '1mg'$  compound     No current facility-administered medications for this visit.   Facility-Administered Medications Ordered in Other Visits  Medication Dose Route Frequency Provider Last Rate Last Admin   0.9 %  sodium chloride infusion   Intravenous Once Sindy Guadeloupe, MD        Allergies  Allergen Reactions   Cefdinir Swelling    Swelling in mouth    Cephalosporins Swelling   Dairy Aid [Lactase] Swelling    Milk and milk products cause swelling and tingling of tongue   Flagyl [Metronidazole] Swelling    Mouth and throat and ears go red and burn   Lactose Intolerance (Gi) Other (See Comments)    GI upset.  Eats only gluten free   Penicillins Rash    Rash/hives   Phenylephrine-Guaifenesin Anaphylaxis    Night terrors   Sulfa Antibiotics Rash and Hives    Other reaction(s): Unknown    Sulfonamide Derivatives Rash    Rash/itching.  From head to her toes, the itching was horrible. Penicillin is the worst   Tape Rash    Adhesives all cause a problem severely.  It doesn't matter if it is paper tape or not.   Dexilant [Dexlansoprazole]     Pressure in head like head is in a  vice and being squeezed   Entex Lq [Phenylephrine-Guaifenesin]  Night terrors   Other Other (See Comments)    Flu vaccine:  Weakness, dizziness, tia type symptoms.     Oxycodone Nausea And Vomiting    And itching   Succinylcholine Other (See Comments)    Trouble waking up   Vancomycin Rash   Clarithromycin Itching and Other (See Comments)   Tetracycline Other (See Comments)    Unkown   Codeine Nausea And Vomiting   Guaifenesin & Derivatives Other (See Comments)    Night terrors   Lactobacillus Itching   Naproxen Sodium     Itching/rash   Pseudoephedrine Rash   Tetracyclines & Related Other (See Comments)    Was taking flagyl at the same time; unsure which caused the swelling  Swelling of throat. Pharmacist states that he believes it was the flagyl and not the tetracycline   Wheat Bran Other (See Comments)    Upset stomach. Now eats gluten free    Review of Systems  No problems with pacemaker Weight stable   CLINICAL DATA:  Thoracic aortic aneurysm (TAA), follow up   EXAM: CT ANGIOGRAPHY CHEST WITH CONTRAST   TECHNIQUE: Multidetector CT imaging of the chest was performed using the standard protocol during bolus administration of intravenous contrast. Multiplanar CT image reconstructions and MIPs were obtained to evaluate the vascular anatomy.   CONTRAST:  74m ISOVUE-370 IOPAMIDOL (ISOVUE-370) INJECTION 76%   COMPARISON:  August 2021   FINDINGS: Cardiovascular: Preferential opacification of the thoracic aorta. The ascending thoracic aorta measures up to 4.6 cm, similar in size by my measurements. There is a 2 lead implantable cardiac device with stable positioning of the leads. The pulmonary arteries are normal in caliber without proximal filling defects. Normal heart size. No pericardial effusion.   Mediastinum/Nodes: No enlarged mediastinal, hilar, or axillary lymph nodes. The thyroid gland appears normal.   Lungs/Pleura: No pleural effusion. No  pneumothorax. No mass or focal consolidation. No suspicious pulmonary nodules.   Musculoskeletal: No aggressive osseous lesions.   Upper abdomen: The visualized upper abdomen is unremarkable.   Review of the MIP images confirms the above findings.   IMPRESSION:   Stable appearance of ascending thoracic aortic aneurysm measuring up to 4.6 cm. Ascending thoracic aortic aneurysm. Recommend semi-annual imaging followup by CTA or MRA and referral to cardiothoracic surgery if not already obtained. This recommendation follows 2010 ACCF/AHA/AATS/ACR/ASA/SCA/SCAI/SIR/STS/SVM Guidelines for the Diagnosis and Management of Patients With Thoracic Aortic Disease. Circulation. 2010; 121:[id number] Aortic aneurysm NOS (ICD10-I71.9)     Electronically Signed   By: YAlbin FellingM.D.   On: 03/11/2021 09:00 Today's Vitals   03/18/21 0955  BP: 126/77  Pulse: 74  Resp: 20  SpO2: 95%  Weight: 148 lb (67.1 kg)  Height: '5\' 3"'$  (1.6 m)   Body mass index is 26.22 kg/m.  Physical Exam      Exam    General- alert and comfortable    Neck- no JVD,  no carotid bruit   Lungs- clear without rales, wheezes   Cor- regular rate and rhythm, no murmur,no  gallop   Abdomen- soft, non-tender   Extremities - warm, non-tender, no edema   Neuro- oriented, appropriate, no focal weakness   Diagnostic Tests: CT images personally reviewed is noted above and results discussed with patient.  Impression: Stable 4.6 cm centimeter fusiform asymptomatic aneurysm with a tricuspid aortic valve and recent history of surgery and radiation for subglottic cancer.  The aneurysm has remained stable on CT scans for 10 years.  Plan:  Continue annual surveillance  CT scan.  Report any problems with new onset chest pain.   Question of Trileaflet vs. Bileaflet. Recommend repeat Echo since she has not had one in a few years.   No flouroquinolones  Continue daily exercise and heart healthy diet.   She voiced concern about  a "whooshing" sounds that she something hears when she lays down. No obvious cardiac source. I recommended bringing this up to Dr. Rockey Situ when she sees him next.   Nicholes Rough, PA-C [facility] [phone]

## 2021-03-23 NOTE — Progress Notes (Signed)
Remote pacemaker transmission.   

## 2021-03-25 ENCOUNTER — Telehealth: Payer: Self-pay

## 2021-03-25 NOTE — Telephone Encounter (Signed)
Reached out to Wendy Kelly reading Dr. Rockey Situ request for TEE  Dr. Cyndia Bent has suggested a repeat echo  I think we do a TEE next for better look at the valve (not a regular echo)  I had one of the docs that reads the CT scans look at the images and they are concerned that leaflet might be bileaflet  There is no urgency, she can decide when she would like to have this completed  Thx  Tgollan  Dr. Saunders Revel advised that since TEE has not been discussed with pt, she would need to come in for an OV as a TEE has not been disgusted with her by a provider as there will be sedation involved.   Wendy Kelly agrees for an appt to come in to discuss TEE and then have schedule at that appt. Educated pt on what a TEE procedure is. Schedule with Cadence Furth, PA-C on 10/6 at 9 am. Wendy Kelly thankful for reaching out to her and having appt schedule.

## 2021-04-02 DIAGNOSIS — L232 Allergic contact dermatitis due to cosmetics: Secondary | ICD-10-CM | POA: Diagnosis not present

## 2021-04-08 ENCOUNTER — Telehealth: Payer: Self-pay | Admitting: Oncology

## 2021-04-08 NOTE — Telephone Encounter (Signed)
Spoke with patient to reschedule her appointment on 11/10. Patient is agreeable to come in on 11/11 for lab work and to see Dr. Janese Banks. Mailing updated appointment reminders.

## 2021-04-16 ENCOUNTER — Ambulatory Visit: Payer: PPO | Admitting: Medical

## 2021-04-20 NOTE — Progress Notes (Signed)
Cardiology Office Note:    Date:  04/21/2021   ID:  [name], DOB [birth year], MRN [id number]  PCP:  Valerie Roys, DO  [facility] Cardiologist:  None  [facility] Electrophysiologist:  None   Referring MD: Valerie Roys, DO   Chief Complaint: repeat echo per Dr. Cyndia Bent and TEE  History of Present Illness:    Wendy Kelly is a 65 y.o. 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  Most recent echo was 2018.  This showed LVEF 55 to 60%, normal wall motion, grade 1 diastolic dysfunction, mild AI, aortic root dilated to 4.2 cm, ascending aorta dilated to 4.6 cm.  Patient saw Dr. Rockey Situ 03/10/2021 and she was doing symptomatically well.  Review of echo showed possible bicuspid or tricuspid valve, considering TEE.  She was seen by CT surgery 03/18/2021 for follow-up of her aortic aneurysm.  It had been stable at 4.8 since 2012.  She has a history of hypotension.  CTA performed showed stable aneurysm at 4.6 cm.  Today, the patient has been doing well since the last visit. No LLE, orthopnea, pnd, chest pain, sob. She has had throat cancer in the past with h/o radiation. She does have a hard time swallowing at times, needs lots of water at times.   Past Medical History:  Diagnosis Date   AA (aortic aneurysm) ([place])    a. 04/2011 Echo: Ao Root: 4.1cm, Asc Ao 4.7cm.   Anxiety    AV block, 2nd degree    a. 05/2005 - s/p MDT Adapta ADDR01 Dual Chamber PPM   Bicuspid aortic valve    Cancer ([place])    Throat   Chest pain    a. 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   Deep vein thrombosis (DVT) of left lower extremity ([place]) 10/03/2017   Dehydration 08/11/2017   Depression    Expressive aphasia    a. ongoing since 04/2011 - seen by neurology - ? TIA vs. Migraine   Facial  numbness    a. ongoing since 04/2011 - seen by neurology - ? TIA vs. 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 04/2011 Echo: EF 55-65%, No RWMA, Gr 1 DD.   Thyroid disease     Past Surgical History:  Procedure Laterality Date   ABDOMINAL WALL MESH  REMOVAL     bladder tack     cataract Bilateral 2004   CHOLECYSTECTOMY  2000   COLONOSCOPY WITH PROPOFOL N/A 06/26/2019   Procedure: COLONOSCOPY WITH PROPOFOL;  Surgeon: Lucilla Lame, MD;  Location[facility];  Service: Endoscopy;  Laterality: N/A;   DILATION AND CURETTAGE OF UTERUS     DIRECT LARYNGOSCOPY Right 06/16/2017   Procedure: MICRO DIRECT LARYNGOSCOPY WITH BIOPSY OF RIGHT BASE OF TONGUE;  Surgeon: Carloyn Manner, MD;  Location: [facility] ORS;  Service: ENT;  Laterality: Right;   EP IMPLANTABLE DEVICE N/A 06/04/2015   Procedure: PPM Generator  Changeout;  Surgeon: Deboraha Sprang, MD;  Location: [place] CV LAB;  Service: Cardiovascular;  Laterality: N/A;   ESOPHAGOGASTRODUODENOSCOPY (EGD) WITH PROPOFOL N/A 12/18/2015   Procedure: ESOPHAGOGASTRODUODENOSCOPY (EGD) WITH gastric biopsy and dilation;  Surgeon: Lucilla Lame, MD;  Location: [place];  Service: Endoscopy;  Laterality: N/A;   EYE SURGERY     INSERT / REPLACE / REMOVE PACEMAKER     IR FLUORO GUIDE PORT INSERTION RIGHT  06/22/2017   IR REMOVAL TUN ACCESS W/ PORT W/O FL MOD SED  04/14/2018   MOHS SURGERY  02/2018   PACEMAKER INSERTION  2006   Medtronic Adapta ADDR01   TONSILLECTOMY     TONSILLECTOMY     VAGINAL HYSTERECTOMY  2013   mad/cope    Current Medications: Current Meds  Medication Sig   aspirin 81 MG tablet Take 81 mg by  mouth daily.   azelastine (ASTELIN) 0.1 % nasal spray SMARTSIG:1-2 Spray(s) Both Nares Every 12 Hours PRN   b complex vitamins tablet Take 2 tablets by mouth daily.    BLACK ELDERBERRY,BERRY-FLOWER, PO Take 2 capsules by mouth daily.    Calcium Carb-Cholecalciferol (CALCIUM 500 + D3 PO) Take 2 Doses by mouth daily.    CRANBERRY SOFT PO Take 2 each by mouth daily.    Digestive Enzymes (DIGESTIVE ENZYME PO) Take 1-2 capsules by mouth daily.   PRESCRIPTION MEDICATION Place 1 tablet vaginally 3 (three) times a week. E3 1mg  and Testosterone 1mg  compound   sodium chloride (MURO 128) 5 % ophthalmic solution Place 1 drop into both eyes daily.   SYNTHROID 75 MCG tablet TAKE 1 TABLET BY MOUTH EVERY DAY   Zinc 50 MG CAPS Take 50 mg by mouth daily.     Allergies:   Cefdinir, Cephalosporins, Dairy aid [lactase], Flagyl [metronidazole], Lactose intolerance (gi), Penicillins, Phenylephrine-guaifenesin, Sulfa antibiotics, Sulfonamide derivatives, Tape, Dexilant [dexlansoprazole], Entex lq [phenylephrine-guaifenesin], Other, Oxycodone, Succinylcholine, Vancomycin, Clarithromycin, Tetracycline, Codeine, Guaifenesin & derivatives, Lactobacillus, Naproxen sodium, Pseudoephedrine, Tetracyclines & related, and Wheat bran   Social History   Socioeconomic History   Marital status: Married    Spouse name: Not on file   Number of children: Not on file   Years of education: Not on file   Highest education level: Not on file  Occupational History   Not on file  Tobacco Use   Smoking status: Never   Smokeless tobacco: Never   Tobacco comments:    tobacco use -no  Vaping Use   Vaping Use: Never used  Substance and Sexual Activity   Alcohol use: No   Drug use: No   Sexual activity: Yes    Birth control/protection: Surgical  Other Topics Concern   Not on file  Social History Narrative   Lives in [place] with husband.  Works out regularly.  Works as a Geophysicist/field seismologist - owns own business.    Social  Determinants of Health   Financial Resource Strain: Not on file  Food Insecurity: Not on file  Transportation Needs: Not on file  Physical Activity: Not on file  Stress: Not on file  Social Connections: Not on file     Family History: The patient's family history includes Atrial fibrillation in her mother; Breast cancer (age of onset: 9) in her maternal aunt; COPD in her maternal grandmother and mother; Stroke in her father. There is no history of Ovarian cancer or Diabetes.  ROS:   Please see the history of present illness.     All other systems reviewed  and are negative.  EKGs/Labs/Other Studies Reviewed:    The following studies were reviewed today:  Echo 2018 - Left ventricle: The cavity size was normal. Wall thickness was    increased in a pattern of mild LVH. Systolic function was normal.    The estimated ejection fraction was in the range of 55% to 60%.    Wall motion was normal; there were no regional wall motion    abnormalities. Doppler parameters are consistent with abnormal    left ventricular relaxation (grade 1 diastolic dysfunction).  - Aortic valve: There is at least mild regurgitation per color    doppler. No available PHT done. Rec serial evaluation.  - Aortic root: The aortic root was dilated at 4.2cm.  - Ascending aorta: The ascending aorta was dilated at 4.6cm.   CTA chest 02/2021 IMPRESSION: Stable appearance of ascending thoracic aortic aneurysm measuring up to 4.6 cm. Ascending thoracic aortic aneurysm. Recommend semi-annual imaging followup by CTA or MRA and referral to cardiothoracic surgery if not already obtained. This recommendation follows 2010 ACCF/AHA/AATS/ACR/ASA/SCA/SCAI/SIR/STS/SVM Guidelines for the Diagnosis and Management of Patients With Thoracic Aortic Disease. Circulation. 2010; 121: F093-A355. Aortic aneurysm NOS (ICD10-I71.9)    EKG:  EKG is not ordered today.    Recent Labs: 04/29/2020: ALT 25; BUN 13; Potassium 3.7; Sodium  139; TSH 2.140 05/20/2020: Hemoglobin 13.8; Platelets 135 11/05/2020: Creatinine, Ser 0.70  Recent Lipid Panel    Component Value Date/Time   CHOL 186 04/29/2020 1037   TRIG 54 04/29/2020 1037   HDL 78 04/29/2020 1037   CHOLHDL 2.8 06/30/2014 1747   VLDL 18 06/30/2014 1747   LDLCALC 98 04/29/2020 1037    Physical Exam:    VS:  BP 120/80 (BP Location: Left Arm, Patient Position: Sitting, Cuff Size: Normal)   Pulse 68   Ht 5\' 3"  (1.6 m)   Wt 143 lb (64.9 kg)   SpO2 97%   BMI 25.33 kg/m     Wt Readings from Last 3 Encounters:  04/21/21 143 lb (64.9 kg)  03/18/21 148 lb (67.1 kg)  03/10/21 149 lb 4 oz (67.7 kg)     GEN:  Well nourished, well developed in no acute distress HEENT: Normal NECK: No JVD; No carotid bruits LYMPHATICS: No lymphadenopathy CARDIAC: RRR, n+murmur, no rubs, gallops RESPIRATORY:  Clear to auscultation without rales, wheezing or rhonchi  ABDOMEN: Soft, non-tender, non-distended MUSCULOSKELETAL:  No edema; No deformity  SKIN: Warm and dry NEUROLOGIC:  Alert and oriented x 3 PSYCHIATRIC:  Normal affect   ASSESSMENT:    1. Aortic valve disorder   2. Aneurysm of ascending aorta without rupture    PLAN:    In order of problems listed above:  Dilated ascending aorta Aortic valve insufficiency Recent CT showed Aortic dialtion of 4.6cm and saw CTTS earlier this month. Echo in 2018 showed normal LVSF with mild AI and question of bileaflet or trileaflet valve. Patient has CT imaging annually. She denies any acute CHF symptoms. NO anginal symptoms. Spoke with DOD regarding plan. He reviewed imaging and was unable to see aortic valve well. I will order echo to attempt to see aortic valve. Patient will follow-up with Dr. Rockey Situ for consideration of TEE vs CT. She has a h/o throat cancer with radiation and trouble with swallowing, so may exclude patient from TEE.   Disposition: Follow up in 1-2 month(s) with MD     Signed, Eberardo Demello Ninfa Meeker, PA-C   04/21/2021 11:50 AM    [place]

## 2021-04-21 ENCOUNTER — Ambulatory Visit: Payer: PPO | Admitting: Medical

## 2021-04-21 ENCOUNTER — Other Ambulatory Visit: Payer: Self-pay

## 2021-04-21 ENCOUNTER — Encounter: Payer: Self-pay | Admitting: Medical

## 2021-04-21 VITALS — BP 120/80 | HR 68 | Ht 63.0 in | Wt 143.0 lb

## 2021-04-21 DIAGNOSIS — I359 Nonrheumatic aortic valve disorder, unspecified: Secondary | ICD-10-CM

## 2021-04-21 DIAGNOSIS — I7121 Aneurysm of the ascending aorta, without rupture: Secondary | ICD-10-CM | POA: Diagnosis not present

## 2021-04-21 NOTE — Patient Instructions (Addendum)
Medication Instructions:  - Your physician recommends that you continue on your current medications as directed. Please refer to the Current Medication list given to you today.  *If you need a refill on your cardiac medications before your next appointment, please call your pharmacy*   Lab Work: - none ordered  If you have labs (blood work) drawn today and your tests are completely normal, you will receive your results only by: [place] (if you have MyChart) OR A paper copy in the mail If you have any lab test that is abnormal or we need to change your treatment, we will call you to review the results.   Testing/Procedures: - Your physician has requested that you have an echocardiogram. Echocardiography is a painless test that uses sound waves to create images of your heart. It provides your doctor with information about the size and shape of your heart and how well your heart's chambers and valves are working. This procedure takes approximately one hour. There are no restrictions for this procedure. There is a possibility that an IV may need to be started during your test to inject an image enhancing agent. This is done to obtain more optimal pictures of your heart. Therefore we ask that you do at least drink some water prior to coming in to hydrate your veins.     Follow-Up: At [facility], you and your health needs are our priority.  As part of our continuing mission to provide you with exceptional heart care, we have created designated Provider Care Teams.  These Care Teams include your primary Cardiologist (physician) and Advanced Practice Providers (APPs -  Physician Assistants and Nurse Practitioners) who all work together to provide you with the care you need, when you need it.  We recommend signing up for the patient portal called "MyChart".  Sign up information is provided on this After Visit Summary.  MyChart is used to connect with patients for Virtual Visits  (Telemedicine).  Patients are able to view lab/test results, encounter notes, upcoming appointments, etc.  Non-urgent messages can be sent to your provider as well.   To learn more about what you can do with MyChart, go to [email or web address].    Your next appointment:   1-2 month(s)  The format for your next appointment:   In Person  Provider:   Ida Rogue, MD (only)   Other Instructions  Echocardiogram An echocardiogram is a test that uses sound waves (ultrasound) to produce images of the heart. Images from an echocardiogram can provide important information about: Heart size and shape. The size and thickness and movement of your heart's walls. Heart muscle function and strength. Heart valve function or if you have stenosis. Stenosis is when the heart valves are too narrow. If blood is flowing backward through the heart valves (regurgitation). A tumor or infectious growth around the heart valves. Areas of heart muscle that are not working well because of poor blood flow or injury from a heart attack. Aneurysm detection. An aneurysm is a weak or damaged part of an artery wall. The wall bulges out from the normal force of blood pumping through the body. Tell a health care provider about: Any allergies you have. All medicines you are taking, including vitamins, herbs, eye drops, creams, and over-the-counter medicines. Any blood disorders you have. Any surgeries you have had. Any medical conditions you have. Whether you are pregnant or may be pregnant. What are the risks? Generally, this is a safe test. However, problems may occur, including  an allergic reaction to dye (contrast) that may be used during the test. What happens before the test? No specific preparation is needed. You may eat and drink normally. What happens during the test?  You will take off your clothes from the waist up and put on a hospital gown. Electrodes or electrocardiogram (ECG)patches may be  placed on your chest. The electrodes or patches are then connected to a device that monitors your heart rate and rhythm. You will lie down on a table for an ultrasound exam. A gel will be applied to your chest to help sound waves pass through your skin. A handheld device, called a transducer, will be pressed against your chest and moved over your heart. The transducer produces sound waves that travel to your heart and bounce back (or "echo" back) to the transducer. These sound waves will be captured in real-time and changed into images of your heart that can be viewed on a video monitor. The images will be recorded on a computer and reviewed by your health care provider. You may be asked to change positions or hold your breath for a short time. This makes it easier to get different views or better views of your heart. In some cases, you may receive contrast through an IV in one of your veins. This can improve the quality of the pictures from your heart. The procedure may vary among health care providers and hospitals. What can I expect after the test? You may return to your normal, everyday life, including diet, activities, and medicines, unless your health care provider tells you not to do that. Follow these instructions at home: It is up to you to get the results of your test. Ask your health care provider, or the department that is doing the test, when your results will be ready. Keep all follow-up visits. This is important. Summary An echocardiogram is a test that uses sound waves (ultrasound) to produce images of the heart. Images from an echocardiogram can provide important information about the size and shape of your heart, heart muscle function, heart valve function, and other possible heart problems. You do not need to do anything to prepare before this test. You may eat and drink normally. After the echocardiogram is completed, you may return to your normal, everyday life, unless your health  care provider tells you not to do that. This information is not intended to replace advice given to you by your health care provider. Make sure you discuss any questions you have with your health care provider. Document Revised: 02/19/2020 Document Reviewed: 02/19/2020 Elsevier Patient Education  2022 [employer].

## 2021-04-25 ENCOUNTER — Other Ambulatory Visit: Payer: Self-pay | Admitting: Family Medicine

## 2021-04-25 NOTE — Telephone Encounter (Signed)
Requested medication (s) are due for refill today: yes  Requested medication (s) are on the active medication list: yes  Last refill:  04/22/20 #90 3 RF  Future visit scheduled: yes  Notes to clinic:  overdue labs and appt > 3 months    Requested Prescriptions  Pending Prescriptions Disp Refills   levothyroxine (SYNTHROID) 75 MCG tablet [Pharmacy Med Name: LEVOTHYROXINE 0.075MG  (75MCG) TABS] 90 tablet 3    Sig: TAKE 1 TABLET BY MOUTH EVERY DAY     Endocrinology:  Hypothyroid Agents Failed - 04/25/2021 11:32 AM      Failed - TSH needs to be rechecked within 3 months after an abnormal result. Refill until TSH is due.      Failed - TSH in normal range and within 360 days    TSH  Date Value Ref Range Status  04/29/2020 2.140 0.450 - 4.500 uIU/mL Final          Failed - Valid encounter within last 12 months    Recent Outpatient Visits           12 months ago [facility] [employer] preventive visit   [place], [place], DO   1 year ago Acquired hypothyroidism   [place], [place], DO   1 year ago Genitourinary bleeding   [facility] [place] Eulogio Bear, NP   1 year ago Acute non-recurrent maxillary sinusitis   [facility] [place], Megan P, DO   1 year ago COVID-19   [place], [place], DO       Future Appointments             In 6 days Wynetta Emery, Barb Merino, DO [employer], [place]   In 1 month Gollan, Kathlene November, MD [place], [place]

## 2021-04-27 ENCOUNTER — Encounter: Payer: Self-pay | Admitting: Family Medicine

## 2021-04-27 NOTE — Telephone Encounter (Signed)
Spoke with patient and she notified me that she has enough medication to last her until Wednesday.

## 2021-04-27 NOTE — Telephone Encounter (Signed)
Please see if she has enough to last until Monday- we will check her labs at her appointment on Friday

## 2021-04-28 ENCOUNTER — Other Ambulatory Visit: Payer: Self-pay | Admitting: Family Medicine

## 2021-04-28 DIAGNOSIS — I480 Paroxysmal atrial fibrillation: Secondary | ICD-10-CM

## 2021-04-28 DIAGNOSIS — I700 Atherosclerosis of aorta: Secondary | ICD-10-CM

## 2021-04-28 DIAGNOSIS — E039 Hypothyroidism, unspecified: Secondary | ICD-10-CM

## 2021-04-28 MED ORDER — LEVOTHYROXINE SODIUM 75 MCG PO TABS: 75.0000 ug | ORAL_TABLET | Freq: Every day | ORAL | 0 refills | Status: DC

## 2021-04-29 NOTE — Telephone Encounter (Signed)
Requested medication (s) are due for refill today:   Yes  Requested medication (s) are on the active medication list:   Yes  Future visit scheduled:   Yes in 2 days with Dr. Wynetta Emery.   Pt was given a 30 day supply by Dr. Wynetta Emery on 05/01/2021.  Pt requesting a 90 day supply   Last ordered: 05/01/2021 #30, 0 refills  Returned because pt requesting a 90 day supply.   Dr. Wynetta Emery to review.  See above note    Requested Prescriptions  Pending Prescriptions Disp Refills   levothyroxine (SYNTHROID) 75 MCG tablet [Pharmacy Med Name: LEVOTHYROXINE 0.075MG  (75MCG) TABS] 90 tablet     Sig: TAKE 1 TABLET(75 MCG) BY MOUTH DAILY     Endocrinology:  Hypothyroid Agents Failed - 04/28/2021  3:29 PM      Failed - TSH needs to be rechecked within 3 months after an abnormal result. Refill until TSH is due.      Failed - TSH in normal range and within 360 days    TSH  Date Value Ref Range Status  04/29/2020 2.140 0.450 - 4.500 uIU/mL Final          Failed - Valid encounter within last 12 months    Recent Outpatient Visits           1 year ago [facility] [employer] preventive visit   [place], [place], DO   1 year ago Acquired hypothyroidism   [place], [place], DO   1 year ago Genitourinary bleeding   [facility] Eulogio Bear, NP   1 year ago Acute non-recurrent maxillary sinusitis   [facility] [place], Megan P, DO   1 year ago COVID-19   [place], [place], DO       Future Appointments             In 2 days Wynetta Emery, Barb Merino, DO [place], [place]   In 1 month Gollan, Kathlene November, MD [facility], [place]

## 2021-05-01 ENCOUNTER — Ambulatory Visit (INDEPENDENT_AMBULATORY_CARE_PROVIDER_SITE_OTHER): Payer: PPO | Admitting: Family Medicine

## 2021-05-01 ENCOUNTER — Other Ambulatory Visit: Payer: Self-pay

## 2021-05-01 ENCOUNTER — Encounter: Payer: Self-pay | Admitting: Family Medicine

## 2021-05-01 VITALS — BP 104/68 | HR 75 | Ht 63.0 in | Wt 143.0 lb

## 2021-05-01 DIAGNOSIS — I480 Paroxysmal atrial fibrillation: Secondary | ICD-10-CM

## 2021-05-01 DIAGNOSIS — Z13820 Encounter for screening for osteoporosis: Secondary | ICD-10-CM

## 2021-05-01 DIAGNOSIS — I7121 Aneurysm of the ascending aorta, without rupture: Secondary | ICD-10-CM | POA: Diagnosis not present

## 2021-05-01 DIAGNOSIS — M314 Aortic arch syndrome [Takayasu]: Secondary | ICD-10-CM | POA: Diagnosis not present

## 2021-05-01 DIAGNOSIS — E039 Hypothyroidism, unspecified: Secondary | ICD-10-CM

## 2021-05-01 DIAGNOSIS — Z0000 Encounter for general adult medical examination without abnormal findings: Secondary | ICD-10-CM | POA: Diagnosis not present

## 2021-05-01 DIAGNOSIS — C109 Malignant neoplasm of oropharynx, unspecified: Secondary | ICD-10-CM

## 2021-05-01 DIAGNOSIS — Z1231 Encounter for screening mammogram for malignant neoplasm of breast: Secondary | ICD-10-CM

## 2021-05-01 DIAGNOSIS — I700 Atherosclerosis of aorta: Secondary | ICD-10-CM

## 2021-05-01 LAB — URINALYSIS, ROUTINE W REFLEX MICROSCOPIC
Bilirubin, UA: NEGATIVE
Glucose, UA: NEGATIVE
Ketones, UA: NEGATIVE
Leukocytes,UA: NEGATIVE
Nitrite, UA: NEGATIVE
Protein,UA: NEGATIVE
RBC, UA: NEGATIVE
Specific Gravity, UA: 1.02 (ref 1.005–1.030)
Urobilinogen, Ur: 0.2 mg/dL (ref 0.2–1.0)
pH, UA: 6 (ref 5.0–7.5)

## 2021-05-01 NOTE — Assessment & Plan Note (Signed)
Following with cardiology. Call with any concerns. Continue to monitor. Call with any concerns.

## 2021-05-01 NOTE — Assessment & Plan Note (Signed)
Will keep BP and cholesterol under good continue to monitor. Call with any concerns. Continue to monitor.

## 2021-05-01 NOTE — Patient Instructions (Signed)
Call to schedule your mammogram and bone density [facility]  Address: [address], [place], [place] [address]  Phone: [phone]

## 2021-05-01 NOTE — Assessment & Plan Note (Signed)
Rechecking labs today. Await results. Treat as needed.  °

## 2021-05-01 NOTE — Assessment & Plan Note (Signed)
Following with onc. Doing well. 4 years post diagnosis. Continue to monitor.

## 2021-05-01 NOTE — Progress Notes (Signed)
BP 104/68   Pulse 75   Ht 5\' 3"  (1.6 m)   Wt 143 lb (64.9 kg)   BMI 25.33 kg/m    Subjective:    Patient ID: Wendy Kelly, female    DOB: May 24, 1956, 65 y.o.   MRN: [id number]  HPI: Wendy Kelly is a 65 y.o. female presenting on 05/01/2021 for comprehensive medical examination. Current medical complaints include:  HYPOTHYROIDISM Thyroid control status:controlled Satisfied with current treatment? yes Medication side effects: no Medication compliance: excellent compliance Recent dose adjustment:no Fatigue: yes Cold intolerance: yes Heat intolerance: no Weight gain: no Weight loss: no Constipation: no Diarrhea/loose stools: no Palpitations: no Lower extremity edema: no Anxiety/depressed mood: no  Menopausal Symptoms: no  Depression Screen done today and results listed below:  Depression screen [facility] 2/9 05/01/2021 04/29/2020 04/26/2019 04/20/2018 10/10/2017  Decreased Interest 0 0 0 0 0  Down, Depressed, Hopeless 0 0 0 0 0  PHQ - 2 Score 0 0 0 0 0  Altered sleeping 3 3 1 3  -  Tired, decreased energy 2 3 1 1  -  Change in appetite 0 0 0 0 -  Feeling bad or failure about yourself  0 0 0 0 -  Trouble concentrating 0 0 0 0 -  Moving slowly or fidgety/restless 0 0 0 0 -  Suicidal thoughts 0 0 0 0 -  PHQ-9 Score 5 6 2 4  -  Difficult doing work/chores - Not difficult at all Not difficult at all Not difficult at all -  Some recent data might be hidden     Past Medical History:  Past Medical History:  Diagnosis Date   AA (aortic aneurysm) ([place])    a. 04/2011 Echo: Ao Root: 4.1cm, Asc Ao 4.7cm.   Anxiety    AV block, 2nd degree    a. 05/2005 - s/p MDT Adapta ADDR01 Dual Chamber PPM   Bicuspid aortic valve    Cancer ([place])    Throat   Chest pain    a. 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   COVID-19 virus infection 09/11/2019   Deep vein thrombosis (DVT) of left lower extremity ([place]) 10/03/2017   Dehydration 08/11/2017   Depression    Expressive aphasia    a. ongoing since 04/2011 -  seen by neurology - ? TIA vs. Migraine   Facial numbness    a. ongoing since 04/2011 - seen by neurology - ? TIA vs. 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 04/2011 Echo: EF 55-65%, No RWMA, Gr 1 DD.   Thyroid disease     Surgical History:  Past Surgical History:  Procedure Laterality Date   ABDOMINAL WALL MESH  REMOVAL     bladder tack     cataract Bilateral 2004   CHOLECYSTECTOMY  2000   COLONOSCOPY WITH PROPOFOL N/A 06/26/2019   Procedure: COLONOSCOPY WITH PROPOFOL;  Surgeon: Lucilla Lame, MD;  Location: [facility] ENDOSCOPY;  Service: Endoscopy;  Laterality: N/A;   DILATION AND CURETTAGE OF UTERUS     DIRECT LARYNGOSCOPY Right 06/16/2017   Procedure: MICRO DIRECT LARYNGOSCOPY WITH BIOPSY OF RIGHT BASE OF TONGUE;  Surgeon: Carloyn Manner, MD;  Location: [facility] ORS;  Service: ENT;  Laterality: Right;   EP IMPLANTABLE DEVICE N/A 06/04/2015   Procedure: PPM Generator Changeout;  Surgeon: Deboraha Sprang, MD;  Location: [place] [facility];  Service: Cardiovascular;  Laterality: N/A;   ESOPHAGOGASTRODUODENOSCOPY (EGD) WITH PROPOFOL N/A 12/18/2015   Procedure: ESOPHAGOGASTRODUODENOSCOPY (EGD) WITH gastric biopsy and dilation;  Surgeon: Lucilla Lame, MD;  Location: [place];  Service: Endoscopy;  Laterality: N/A;   EYE SURGERY     INSERT / REPLACE / REMOVE PACEMAKER     IR FLUORO GUIDE PORT INSERTION RIGHT  06/22/2017   IR REMOVAL TUN ACCESS W/ PORT W/O FL MOD SED  04/14/2018   MOHS SURGERY  02/2018   PACEMAKER INSERTION  2006   Medtronic Adapta ADDR01   TONSILLECTOMY     TONSILLECTOMY     VAGINAL HYSTERECTOMY  2013   mad/cope    Medications:  Current  Outpatient Medications on File Prior to Visit  Medication Sig   aspirin 81 MG tablet Take 81 mg by mouth daily.   azelastine (ASTELIN) 0.1 % nasal spray SMARTSIG:1-2 Spray(s) Both Nares Every 12 Hours PRN   b complex vitamins tablet Take 2 tablets by mouth daily.    BLACK ELDERBERRY,BERRY-FLOWER, PO Take 2 capsules by mouth daily.    Calcium Carb-Cholecalciferol (CALCIUM 500 + D3 PO) Take 2 Doses by mouth daily.    CRANBERRY SOFT PO Take 2 each by mouth daily.    Digestive Enzymes (DIGESTIVE ENZYME PO) Take 1-2 capsules by mouth daily.   levothyroxine (SYNTHROID) 75 MCG tablet Take 1 tablet (75 mcg total) by mouth daily.   PRESCRIPTION MEDICATION Place 1 tablet vaginally 3 (three) times a week. E3 1mg  and Testosterone 1mg  compound   sodium chloride (MURO 128) 5 % ophthalmic solution Place 1 drop into both eyes daily.   Zinc 50 MG CAPS Take 50 mg by mouth daily.   Current Facility-Administered Medications on File Prior to Visit  Medication   0.9 %  sodium chloride infusion    Allergies:  Allergies  Allergen Reactions   Cefdinir Swelling    Swelling in mouth    Cephalosporins Swelling   Dairy Aid [Tilactase] Swelling    Milk and milk products cause swelling and tingling of tongue   Flagyl [Metronidazole] Swelling    Mouth and throat and ears go red and burn   Lactose Intolerance (Gi) Other (See Comments)    GI upset.  Eats only gluten free   Penicillins Rash    Rash/hives   Phenylephrine-Guaifenesin Anaphylaxis    Night terrors   Sulfa Antibiotics Rash and Hives    Other reaction(s): Unknown    Sulfonamide Derivatives Rash    Rash/itching.  From head to her toes, the itching was horrible. Penicillin is the worst   Tape Rash    Adhesives all cause a problem severely.  It doesn't matter if it is paper tape or not.   Dexilant [Dexlansoprazole]     Pressure in head like head is in a vice and being squeezed   Entex Lq [Phenylephrine-Guaifenesin]     Night terrors   Other  Other (See Comments)    Flu vaccine:  Weakness, dizziness, tia type symptoms.     Oxycodone Nausea And Vomiting    And itching   Succinylcholine Other (See Comments)    Trouble waking up   Vancomycin Rash   Clarithromycin Itching and Other (See Comments)   Haemophilus Influenzae Vaccines Other (See Comments)  Stroke like symptoms   Tetracycline Other (See Comments)    Unkown   Codeine Nausea And Vomiting   Guaifenesin & Derivatives Other (See Comments)    Night terrors   Lactobacillus Itching   Naproxen Sodium     Itching/rash   Pseudoephedrine Rash   Tetracyclines & Related Other (See Comments)    Was taking flagyl at the same time; unsure which caused the swelling  Swelling of throat. Pharmacist states that he believes it was the flagyl and not the tetracycline   Wheat Bran Other (See Comments)    Upset stomach. Now eats gluten free    Social History:  Social History   Socioeconomic History   Marital status: Married    Spouse name: Not on file   Number of children: Not on file   Years of education: Not on file   Highest education level: Not on file  Occupational History   Not on file  Tobacco Use   Smoking status: Never   Smokeless tobacco: Never   Tobacco comments:    tobacco use -no  Vaping Use   Vaping Use: Never used  Substance and Sexual Activity   Alcohol use: No   Drug use: No   Sexual activity: Yes    Birth control/protection: Surgical  Other Topics Concern   Not on file  Social History Narrative   Lives in [place] with husband.  Works out regularly.  Works as a Geophysicist/field seismologist - owns own business.    Social Determinants of Health   Financial Resource Strain: Not on file  Food Insecurity: Not on file  Transportation Needs: Not on file  Physical Activity: Not on file  Stress: Not on file  Social Connections: Not on file  Intimate Partner Violence: Not on file   Social History   Tobacco Use  Smoking Status Never  Smokeless  Tobacco Never  Tobacco Comments   tobacco use -no   Social History   Substance and Sexual Activity  Alcohol Use No    Family History:  Family History  Problem Relation Age of Onset   COPD Mother        alive @ 85   Atrial fibrillation Mother    Stroke Father        died @ 25   Breast cancer Maternal Aunt 48   COPD Maternal Grandmother    Ovarian cancer Neg Hx    Diabetes Neg Hx     Past medical history, surgical history, medications, allergies, family history and social history reviewed with patient today and changes made to appropriate areas of the chart.   Review of Systems  Constitutional:  Positive for chills and malaise/fatigue. Negative for diaphoresis, fever and weight loss.  HENT: Negative.    Eyes: Negative.   Respiratory: Negative.    Cardiovascular: Negative.   Gastrointestinal: Negative.   Genitourinary: Negative.   Musculoskeletal: Negative.   Skin: Negative.   Neurological: Negative.   Endo/Heme/Allergies:  Positive for environmental allergies. Negative for polydipsia. Does not bruise/bleed easily.  Psychiatric/Behavioral: Negative.    All other ROS negative except what is listed above and in the HPI.      Objective:    BP 104/68   Pulse 75   Ht 5\' 3"  (1.6 m)   Wt 143 lb (64.9 kg)   BMI 25.33 kg/m   Wt Readings from Last 3 Encounters:  05/01/21 143 lb (64.9 kg)  04/21/21 143 lb (64.9 kg)  03/18/21 148 lb (67.1 kg)  Physical Exam Vitals and nursing note reviewed.  Constitutional:      General: She is not in acute distress.    Appearance: Normal appearance. She is not ill-appearing, toxic-appearing or diaphoretic.  HENT:     Head: Normocephalic and atraumatic.     Right Ear: Tympanic membrane, ear canal and external ear normal. There is no impacted cerumen.     Left Ear: Tympanic membrane, ear canal and external ear normal. There is no impacted cerumen.     Nose: Nose normal. No congestion or rhinorrhea.     Mouth/Throat:     Mouth:  Mucous membranes are moist.     Pharynx: Oropharynx is clear. No oropharyngeal exudate or posterior oropharyngeal erythema.  Eyes:     General: No scleral icterus.       Right eye: No discharge.        Left eye: No discharge.     Extraocular Movements: Extraocular movements intact.     Conjunctiva/sclera: Conjunctivae normal.     Pupils: Pupils are equal, round, and reactive to light.  Neck:     Vascular: No carotid bruit.  Cardiovascular:     Rate and Rhythm: Normal rate and regular rhythm.     Pulses: Normal pulses.     Heart sounds: No murmur heard.   No friction rub. No gallop.  Pulmonary:     Effort: Pulmonary effort is normal. No respiratory distress.     Breath sounds: Normal breath sounds. No stridor. No wheezing, rhonchi or rales.  Chest:     Chest wall: No tenderness.  Breasts:    Right: Normal.     Left: Normal.  Abdominal:     General: Abdomen is flat. Bowel sounds are normal. There is no distension.     Palpations: Abdomen is soft. There is no mass.     Tenderness: There is no abdominal tenderness. There is no right CVA tenderness, left CVA tenderness, guarding or rebound.     Hernia: No hernia is present.  Genitourinary:    Comments: Pelvic exams deferred with shared decision making Musculoskeletal:        General: No swelling, tenderness, deformity or signs of injury.     Cervical back: Normal range of motion and neck supple. No rigidity. No muscular tenderness.     Right lower leg: No edema.     Left lower leg: No edema.  Lymphadenopathy:     Cervical: No cervical adenopathy.  Skin:    General: Skin is warm and dry.     Capillary Refill: Capillary refill takes less than 2 seconds.     Coloration: Skin is not jaundiced or pale.     Findings: No bruising, erythema, lesion or rash.  Neurological:     General: No focal deficit present.     Mental Status: She is alert and oriented to person, place, and time. Mental status is at baseline.     Cranial Nerves: No  cranial nerve deficit.     Sensory: No sensory deficit.     Motor: No weakness.     Coordination: Coordination normal.     Gait: Gait normal.     Deep Tendon Reflexes: Reflexes normal.  Psychiatric:        Mood and Affect: Mood normal.        Behavior: Behavior normal.        Thought Content: Thought content normal.        Judgment: Judgment normal.    Results for orders placed  or performed in visit on 03/10/21  CUP PACEART REMOTE DEVICE CHECK  Result Value Ref Range   Date Time Interrogation Session [id number]    Pulse Generator Manufacturer MERM    Pulse Gen Model ADDRL1 Adapta    Pulse Gen Serial Number [id number]    Clinic Name [facility] Pulse Generator Type Implantable Pulse Generator    Implantable Pulse Generator Implant Date [id number]    Implantable Lead Manufacturer MERM    Implantable Lead Model 4092 CapSure SP Novus    Implantable Lead Serial Number [id number]    Implantable Lead Implant Date 70350093    Implantable Lead Location [id number]    Implantable Lead Manufacturer MERM    Implantable Lead Model 4592 CapSure SP Novus    Implantable Lead Serial Number [id number]    Implantable Lead Implant Date [id number]    Implantable Lead Location [id number]    Lead Channel Setting Sensing Sensitivity 2.80 mV   Lead Channel Setting Pacing Amplitude 2.000 V   Lead Channel Setting Pacing Pulse Width 0.40 ms   Lead Channel Setting Pacing Amplitude 2.500 V   Lead Channel Impedance Value 522 ohm   Lead Channel Pacing Threshold Amplitude 0.500 V   Lead Channel Pacing Threshold Pulse Width 0.40 ms   Lead Channel Impedance Value 732 ohm   Lead Channel Pacing Threshold Amplitude 1.125 V   Lead Channel Pacing Threshold Pulse Width 0.40 ms   Battery Status OK    Battery Remaining Longevity 125 mo   Battery Voltage 2.79 V   Battery Impedance 261 ohm   Brady Statistic AP VP Percent 0 %   Brady Statistic AS VP Percent 0 %   Brady Statistic AP VS Percent 18 %    Brady Statistic AS VS Percent 82 %      Assessment & Plan:   Problem List Items Addressed This Visit       Cardiovascular and Mediastinum   Ascending aortic aneurysm [facility])    Following with cardiology. Call with any concerns. Continue to monitor. Call with any concerns.       Atrial fibrillation [facility])    Following with cardiology. Call with any concerns. Continue to monitor. Call with any concerns.       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 Call with any concerns. Continue to monitor. Call with any concerns.       Aortic atherosclerosis ([facility])    Will keep BP and cholesterol under good continue to monitor. Call with any concerns. Continue to monitor.         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 Doing well. 4 years post diagnosis. Continue to monitor.         Endocrine   Hypothyroidism    Rechecking labs today. Await results. Treat as needed.      Other Visit Diagnoses     Routine general medical examination at a health care facility    -  Primary   Vaccines up to date/declined. Screening labs checked today. Mammogram and DEXA ordered. Colonoscopy up to date. Continue diet and exercise. Call with concerns.    Encounter for screening mammogram for malignant neoplasm of breast       Mammogram ordered today.   Relevant Orders   MM 3D SCREEN BREAST BILATERAL   Screening for osteoporosis       DEXA ordered today.   Relevant Orders   DG Bone  Density        Follow up plan: Return in about 1 year (around 05/01/2022), or physical.   LABORATORY TESTING:  - Pap smear: not applicable  IMMUNIZATIONS:   - Tdap: Tetanus vaccination status reviewed: last tetanus booster within 10 years. - Influenza: Refused - Pneumovax: Refused - Prevnar: Refused - COVID: Refused - Shingrix vaccine: Refused  SCREENING: -Mammogram: Ordered today  - Colonoscopy: Up to date  - Bone Density:  Ordered today   PATIENT COUNSELING:   Advised to take 1 mg of folate supplement per day if capable of pregnancy.   Sexuality: Discussed sexually transmitted diseases, partner selection, use of condoms, avoidance of unintended pregnancy  and contraceptive alternatives.   Advised to avoid cigarette smoking.  I discussed with the patient that most people either abstain from alcohol or drink within safe limits (<=14/week and <=4 drinks/occasion for males, <=7/weeks and <= 3 drinks/occasion for females) and that the risk for alcohol disorders and other health effects rises proportionally with the number of drinks per week and how often a drinker exceeds daily limits.  Discussed cessation/primary prevention of drug use and availability of treatment for abuse.   Diet: Encouraged to adjust caloric intake to maintain  or achieve ideal body weight, to reduce intake of dietary saturated fat and total fat, to limit sodium intake by avoiding high sodium foods and not adding table salt, and to maintain adequate dietary potassium and calcium preferably from fresh fruits, vegetables, and low-fat dairy products.    stressed the importance of regular exercise  Injury prevention: Discussed safety belts, safety helmets, smoke detector, smoking near bedding or upholstery.   Dental health: Discussed importance of regular tooth brushing, flossing, and dental visits.    NEXT PREVENTATIVE PHYSICAL DUE IN 1 YEAR. Return in about 1 year (around 05/01/2022), or physical.

## 2021-05-02 ENCOUNTER — Other Ambulatory Visit: Payer: Self-pay | Admitting: Family Medicine

## 2021-05-02 LAB — COMPREHENSIVE METABOLIC PANEL
ALT: 21 IU/L (ref 0–32)
AST: 25 IU/L (ref 0–40)
Albumin/Globulin Ratio: 2.9 — ABNORMAL HIGH (ref 1.2–2.2)
Albumin: 4.4 g/dL (ref 3.8–4.8)
Alkaline Phosphatase: 84 IU/L (ref 44–121)
BUN/Creatinine Ratio: 19 (ref 12–28)
BUN: 15 mg/dL (ref 8–27)
Bilirubin Total: 0.8 mg/dL (ref 0.0–1.2)
CO2: 26 mmol/L (ref 20–29)
Calcium: 9.3 mg/dL (ref 8.7–10.3)
Chloride: 106 mmol/L (ref 96–106)
Creatinine, Ser: 0.78 mg/dL (ref 0.57–1.00)
Globulin, Total: 1.5 g/dL (ref 1.5–4.5)
Glucose: 84 mg/dL (ref 70–99)
Potassium: 4.1 mmol/L (ref 3.5–5.2)
Sodium: 144 mmol/L (ref 134–144)
Total Protein: 5.9 g/dL — ABNORMAL LOW (ref 6.0–8.5)
eGFR: 84 mL/min/{1.73_m2} (ref >59)

## 2021-05-02 LAB — CBC WITH DIFFERENTIAL/PLATELET
Basophils Absolute: 0 10*3/uL (ref 0.0–0.2)
Basos: 1 %
EOS (ABSOLUTE): 0.1 10*3/uL (ref 0.0–0.4)
Eos: 4 %
Hematocrit: 41.4 % (ref 34.0–46.6)
Hemoglobin: 14 g/dL (ref 11.1–15.9)
Immature Grans (Abs): 0 10*3/uL (ref 0.0–0.1)
Immature Granulocytes: 0 %
Lymphocytes Absolute: 0.8 10*3/uL (ref 0.7–3.1)
Lymphs: 22 %
MCH: 31 pg (ref 26.6–33.0)
MCHC: 33.8 g/dL (ref 31.5–35.7)
MCV: 92 fL (ref 79–97)
Monocytes Absolute: 0.3 10*3/uL (ref 0.1–0.9)
Monocytes: 10 %
Neutrophils Absolute: 2.2 10*3/uL (ref 1.4–7.0)
Neutrophils: 63 %
Platelets: 147 10*3/uL — ABNORMAL LOW (ref 150–450)
RBC: 4.51 x10E6/uL (ref 3.77–5.28)
RDW: 12.1 % (ref 11.7–15.4)
WBC: 3.5 10*3/uL (ref 3.4–10.8)

## 2021-05-02 LAB — LIPID PANEL W/O CHOL/HDL RATIO
Cholesterol, Total: 164 mg/dL (ref 100–199)
HDL: 68 mg/dL (ref >39)
LDL Chol Calc (NIH): 88 mg/dL (ref 0–99)
Triglycerides: 37 mg/dL (ref 0–149)
VLDL Cholesterol Cal: 8 mg/dL (ref 5–40)

## 2021-05-02 LAB — TSH: TSH: 3.41 u[IU]/mL (ref 0.450–4.500)

## 2021-05-02 NOTE — Telephone Encounter (Signed)
last RF 04/28/21 #30

## 2021-05-06 ENCOUNTER — Other Ambulatory Visit: Payer: Self-pay | Admitting: Family Medicine

## 2021-05-06 DIAGNOSIS — R899 Unspecified abnormal finding in specimens from other organs, systems and tissues: Secondary | ICD-10-CM

## 2021-05-20 ENCOUNTER — Ambulatory Visit (INDEPENDENT_AMBULATORY_CARE_PROVIDER_SITE_OTHER): Payer: PPO | Admitting: Family Medicine

## 2021-05-20 DIAGNOSIS — Z0000 Encounter for general adult medical examination without abnormal findings: Secondary | ICD-10-CM

## 2021-05-20 NOTE — Progress Notes (Signed)
Subjective:   Wendy Kelly is a 65 y.o. female who presents for an Initial Medicare Annual Wellness Visit.  I connected with  Wendy Kelly on 05/20/21 by a video enabled telemedicine application and verified that I am speaking with the correct person using two identifiers.   I discussed the limitations of evaluation and management by telemedicine. The patient expressed understanding and agreed to proceed.    Review of Systems     Cardiac Risk Factors include: advanced age (>29men, >71 women)     Objective:    There were no vitals filed for this visit. There is no height or weight on file to calculate BMI.  Advanced Directives 05/20/2021 05/20/2020 07/23/2019 06/26/2019 05/21/2019 11/09/2018 07/24/2018  Does Patient Have a Medical Advance Directive? Yes No Yes Yes Yes Yes Yes  Type of Advance Directive Living will;Healthcare Power of [place];Living will [place];Living will [place];Living will [place];Living will [place];Living will  Does patient want to make changes to medical advance directive? - - - - No - Patient declined No - Patient declined -  Copy of [place] in Chart? No - copy requested - No - copy requested No - copy requested No - copy requested No - copy requested No - copy requested  Would patient like information on creating a medical advance directive? - - No - Patient declined - - - No - Patient declined    Current Medications (verified) Outpatient Encounter Medications as of 05/20/2021  Medication Sig   aspirin 81 MG tablet Take 81 mg by mouth daily.   azelastine (ASTELIN) 0.1 % nasal spray SMARTSIG:1-2 Spray(s) Both Nares Every 12 Hours PRN   b complex vitamins tablet Take 2 tablets by mouth daily.    BLACK ELDERBERRY,BERRY-FLOWER, PO Take 2 capsules by mouth daily.    Calcium Carb-Cholecalciferol (CALCIUM 500 + D3 PO) Take 2 Doses  by mouth daily.    CRANBERRY SOFT PO Take 2 each by mouth daily.    Digestive Enzymes (DIGESTIVE ENZYME PO) Take 1-2 capsules by mouth daily.   levothyroxine (SYNTHROID) 75 MCG tablet Take 1 tablet (75 mcg total) by mouth daily.   PRESCRIPTION MEDICATION Place 1 tablet vaginally 3 (three) times a week. E3 1mg  and Testosterone 1mg  compound   sodium chloride (MURO 128) 5 % ophthalmic solution Place 1 drop into both eyes daily.   Zinc 50 MG CAPS Take 50 mg by mouth daily.   Facility-Administered Encounter Medications as of 05/20/2021  Medication   0.9 %  sodium chloride infusion    Allergies (verified) Cefdinir, Cephalosporins, Dairy aid [tilactase], Flagyl [metronidazole], Lactose intolerance (gi), Penicillins, Phenylephrine-guaifenesin, Sulfa antibiotics, Sulfonamide derivatives, Tape, Dexilant [dexlansoprazole], Entex lq [phenylephrine-guaifenesin], Other, Oxycodone, Succinylcholine, Vancomycin, Clarithromycin, Haemophilus influenzae vaccines, Tetracycline, Codeine, Guaifenesin & derivatives, Lactobacillus, Naproxen sodium, Pseudoephedrine, Tetracyclines & related, and Wheat bran   History: Past Medical History:  Diagnosis Date   AA (aortic aneurysm) ([place])    a. 04/2011 Echo: Ao Root: 4.1cm, Asc Ao 4.7cm.   Anxiety    AV block, 2nd degree    a. 05/2005 - s/p MDT Adapta ADDR01 Dual Chamber PPM   Bicuspid aortic valve    Cancer ([place])    Throat   Chest pain    a. 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   COVID-19 virus infection 09/11/2019   Deep vein thrombosis (DVT) of left lower extremity ([place]) 10/03/2017   Dehydration 08/11/2017   Depression  Expressive aphasia    a. ongoing since 04/2011 - seen by neurology - ? TIA vs. Migraine   Facial numbness    a. ongoing since 04/2011 - seen by neurology - ? TIA vs. Migraine   GERD (gastroesophageal reflux disease)    Low blood pressure    Personal history of chemotherapy 2018-2019   Throat   Personal history of radiation therapy 2018-2019   Throat    Presence of permanent cardiac pacemaker    Shingles    pt had on right side of head and his neck   Squamous cell carcinoma 05/2017   lymph node right side of neck   Syncope    a. 04/2011 Echo: EF 55-65%, No RWMA, Gr 1 DD.   Thyroid disease    Past Surgical History:  Procedure Laterality Date   ABDOMINAL WALL MESH  REMOVAL     bladder tack     cataract Bilateral 2004   CHOLECYSTECTOMY  2000   COLONOSCOPY WITH PROPOFOL N/A 06/26/2019   Procedure: COLONOSCOPY WITH PROPOFOL;  Surgeon: Lucilla Lame, MD;  Location: [facility] ENDOSCOPY;  Service: Endoscopy;  Laterality: N/A;   DILATION AND CURETTAGE OF UTERUS     DIRECT LARYNGOSCOPY Right 06/16/2017   Procedure: MICRO DIRECT LARYNGOSCOPY WITH BIOPSY OF RIGHT BASE OF TONGUE;  Surgeon: Carloyn Manner, MD;  Location: [facility] ORS;  Service: ENT;  Laterality: Right;   EP IMPLANTABLE DEVICE N/A 06/04/2015   Procedure: PPM Generator Changeout;  Surgeon: Deboraha Sprang, MD;  Location: [facility];  Service: Cardiovascular;  Laterality: N/A;   ESOPHAGOGASTRODUODENOSCOPY (EGD) WITH PROPOFOL N/A 12/18/2015   Procedure: ESOPHAGOGASTRODUODENOSCOPY (EGD) WITH gastric biopsy and dilation;  Surgeon: Lucilla Lame, MD;  Location: [facility];  Service: Endoscopy;  Laterality: N/A;   EYE SURGERY     INSERT / REPLACE / REMOVE PACEMAKER     IR FLUORO GUIDE PORT INSERTION RIGHT  06/22/2017   IR REMOVAL TUN ACCESS W/ PORT W/O FL MOD SED  04/14/2018   MOHS SURGERY  02/2018   PACEMAKER INSERTION  2006   Medtronic Adapta ADDR01   TONSILLECTOMY     TONSILLECTOMY     VAGINAL HYSTERECTOMY  2013   mad/cope   Family History  Problem Relation Age of Onset   COPD Mother        alive @ 65   Atrial fibrillation Mother    Stroke Father        died @ 3   Breast cancer Maternal Aunt 13   COPD Maternal Grandmother    Ovarian cancer Neg Hx    Diabetes Neg Hx    Social History   Socioeconomic History   Marital status: Married    Spouse name: Not on file    Number of children: Not on file   Years of education: Not on file   Highest education level: Not on file  Occupational History   Not on file  Tobacco Use   Smoking status: Never   Smokeless tobacco: Never   Tobacco comments:    tobacco use -no  Vaping Use   Vaping Use: Never used  Substance and Sexual Activity   Alcohol use: No   Drug use: No   Sexual activity: Yes    Birth control/protection: Surgical  Other Topics Concern   Not on file  Social History Narrative   Lives in [place] with husband.  Works out regularly.  Works as a Geophysicist/field seismologist - owns own business.    Social Determinants  of Health   Financial Resource Strain: Not on file  Food Insecurity: No Food Insecurity   Worried About Charity fundraiser in the Last Year: Never true   Ran Out of Food in the Last Year: Never true  Transportation Needs: Not on file  Physical Activity: Not on file  Stress: Not on file  Social Connections: Socially Integrated   Frequency of Communication with Friends and Family: More than three times a week   Frequency of Social Gatherings with Friends and Family: More than three times a week   Attends Religious Services: More than 4 times per year   Active Member of [employer] or Organizations: Yes   Attends Music therapist: More than 4 times per year   Marital Status: Married    Tobacco Counseling Counseling given: Not Answered Tobacco comments: tobacco use -no   Clinical Intake:  Pre-visit preparation completed: Yes  Pain : No/denies pain     Nutritional Risks: None Diabetes: No  How often do you need to have someone help you when you read instructions, pamphlets, or other written materials from your doctor or pharmacy?: 1 - Never  Diabetic?no  Interpreter Needed?: No  Information entered by :: Leroy Kennedy LPN   Activities of Daily Living In your present state of health, do you have any difficulty performing the following activities: 05/20/2021   Hearing? N  Vision? N  Difficulty concentrating or making decisions? N  Walking or climbing stairs? N  Dressing or bathing? N  Doing errands, shopping? N  Preparing Food and eating ? N  Using the Toilet? N  In the past six months, have you accidently leaked urine? N  Do you have problems with loss of bowel control? N  Managing your Medications? N  Managing your Finances? N  Housekeeping or managing your Housekeeping? N  Some recent data might be hidden    Patient Care Team: Valerie Roys, DO as PCP - General (Family Medicine) Minna Merritts, MD as Consulting Physician (Cardiology) Sindy Guadeloupe, MD as Consulting Physician (Oncology) Carloyn Manner, MD as Referring Physician (Otolaryngology) Noreene Filbert, MD as Referring Physician (Radiation Oncology) Rebekah Chesterfield, LCSW as Social Worker (Licensed Clinical Social Worker)  Indicate any recent Medical Services you may have received from other than [facility] providers in the past year (date may be approximate).     Assessment:   This is a routine wellness examination for Tisha.  Hearing/Vision screen Vision Screening - Comments:: Dr. Sande Brothers [facility]  Dietary issues and exercise activities discussed: Current Exercise Habits: The patient does not participate in regular exercise at present, Time (Minutes): 60, Frequency (Times/Week): 5, Weekly Exercise (Minutes/Week): 300, Intensity: Moderate   Goals Addressed   None    Depression Screen PHQ 2/9 Scores 05/01/2021 04/29/2020 04/26/2019 04/20/2018 10/10/2017 04/19/2017 04/13/2016  PHQ - 2 Score 0 0 0 0 0 0 0  PHQ- 9 Score 5 6 2 4  - - -    Fall Risk Fall Risk  05/20/2021 05/01/2021 04/29/2020 04/26/2019 09/26/2018  Falls in the past year? 0 0 1 0 0  Number falls in past yr: 0 - 0 0 -  Injury with Fall? 0 - 0 0 -  Risk for fall due to : - - No Fall Risks - -  Follow up Falls evaluation completed;Education provided;Falls prevention discussed - Falls  evaluation completed - Falls evaluation completed    FALL RISK PREVENTION PERTAINING TO THE HOME:  Any stairs in  or around the home? Yes  If so, are there any without handrails? No  Home free of loose throw rugs in walkways, pet beds, electrical cords, etc? Yes  Adequate lighting in your home to reduce risk of falls? Yes   ASSISTIVE DEVICES UTILIZED TO PREVENT FALLS:  Life alert? No  Use of a cane, walker or w/c? No  Grab bars in the bathroom? Yes  Shower chair or bench in shower? Yes  Elevated toilet seat or a handicapped toilet? Yes   TIMED UP AND GO:  Was the test performed? No .    Cognitive Function:  Normal cognitive status assessed by direct observation by this Nurse Health Advisor. No abnormalities found.       6CIT Screen 04/29/2020  What Year? 0 points  What month? 0 points  What time? 0 points  Count back from 20 0 points  Months in reverse 0 points  Repeat phrase 2 points  Total Score 2    Immunizations Immunization History  Administered Date(s) Administered   Td 05/18/2006, 04/13/2016    TDAP status: Up to date  Flu Vaccine status: Declined, Education has been provided regarding the importance of this vaccine but patient still declined. Advised may receive this vaccine at local pharmacy or Health Dept. Aware to provide a copy of the vaccination record if obtained from local pharmacy or Health Dept. Verbalized acceptance and understanding.  Pneumococcal vaccine status: Due, Education has been provided regarding the importance of this vaccine. Advised may receive this vaccine at local pharmacy or Health Dept. Aware to provide a copy of the vaccination record if obtained from local pharmacy or Health Dept. Verbalized acceptance and understanding.  Covid-19 vaccine status: Information provided on how to obtain vaccines.   Qualifies for Shingles Vaccine? Yes   Zostavax completed No   Shingrix Completed?: No.    Education has been provided regarding the  importance of this vaccine. Patient has been advised to call insurance company to determine out of pocket expense if they have not yet received this vaccine. Advised may also receive vaccine at local pharmacy or Health Dept. Verbalized acceptance and understanding.  Screening Tests Health Maintenance  Topic Date Due   COVID-19 Vaccine (1) Never done   Zoster Vaccines- Shingrix (1 of 2) 08/01/2021 (Originally 01/17/1975)   Pneumonia Vaccine 28+ Years old (1 - PCV) 05/01/2022 (Originally 01/17/1962)   MAMMOGRAM  06/02/2022   COLONOSCOPY (Pts 45-30yrs Insurance coverage will need to be confirmed)  06/25/2024   TETANUS/TDAP  04/13/2026   DEXA SCAN  Completed   Hepatitis C Screening  Completed   HPV VACCINES  Aged Out    Health Maintenance  Health Maintenance Due  Topic Date Due   COVID-19 Vaccine (1) Never done    Colorectal cancer screening: Type of screening: Colonoscopy. Completed 2020. Repeat every 5-10 years  Mammogram   Scheduled  Bone Density   Scheduled  Lung Cancer Screening: (Low Dose CT Chest recommended if Age 85-80 years, 30 pack-year currently smoking OR have quit w/in 15years.) does not qualify.   Lung Cancer Screening Referral:   Additional Screening:  Hepatitis C Screening: does not qualify; Completed   Vision Screening: Recommended annual ophthalmology exams for early detection of glaucoma and other disorders of the eye. Is the patient up to date with their annual eye exam?  Yes  Who is the provider or what is the name of the office in which the patient attends annual eye exams? [facility] If pt is not  established with a provider, would they like to be referred to a provider to establish care? No .   Dental Screening: Recommended annual dental exams for proper oral hygiene  Community Resource Referral / Chronic Care Management: CRR required this visit?  No   CCM required this visit?  No      Plan:     I have personally reviewed and noted the  following in the patient's chart:   Medical and social history Use of alcohol, tobacco or illicit drugs  Current medications and supplements including opioid prescriptions. Patient is not currently taking opioid prescriptions. Functional ability and status Nutritional status Physical activity Advanced directives List of other physicians Hospitalizations, surgeries, and ER visits in previous 12 months Vitals Screenings to include cognitive, depression, and falls Referrals and appointments  In addition, I have reviewed and discussed with patient certain preventive protocols, quality metrics, and best practice recommendations. A written personalized care plan for preventive services as well as general preventive health recommendations were provided to patient.     Leroy Kennedy, LPN   90/08/2838   Nurse Notes:

## 2021-05-20 NOTE — Patient Instructions (Signed)
Wendy Kelly , Thank you for taking time to come for your Medicare Wellness Visit. I appreciate your ongoing commitment to your health goals. Please review the following plan we discussed and let me know if I can assist you in the future.   Screening recommendations/referrals: Colonoscopy: up to date Mammogram: Education provided Bone Density: Education provided Recommended yearly ophthalmology/optometry visit for glaucoma screening and checkup Recommended yearly dental visit for hygiene and checkup  Vaccinations: Influenza vaccine: not indicated Pneumococcal vaccine: Education provided Tdap vaccine: up to date Shingles vaccine: Education provided    Advanced directives:   yes   Conditions/risks identified:   Next appointment: 05-04-2022 @ 8:00 [facility] 65 Years and Older, Female Preventive care refers to lifestyle choices and visits with your health care provider that can promote health and wellness. What does preventive care include? A yearly physical exam. This is also called an annual well check. Dental exams once or twice a year. Routine eye exams. Ask your health care provider how often you should have your eyes checked. Personal lifestyle choices, including: Daily care of your teeth and gums. Regular physical activity. Eating a healthy diet. Avoiding tobacco and drug use. Limiting alcohol use. Practicing safe sex. Taking low-dose aspirin every day. Taking vitamin and mineral supplements as recommended by your health care provider. What happens during an annual well check? The services and screenings done by your health care provider during your annual well check will depend on your age, overall health, lifestyle risk factors, and family history of disease. Counseling  Your health care provider may ask you questions about your: Alcohol use. Tobacco use. Drug use. Emotional well-being. Home and relationship well-being. Sexual activity. Eating  habits. History of falls. Memory and ability to understand (cognition). Work and work Statistician. Reproductive health. Screening  You may have the following tests or measurements: Height, weight, and BMI. Blood pressure. Lipid and cholesterol levels. These may be checked every 5 years, or more frequently if you are over 68 years old. Skin check. Lung cancer screening. You may have this screening every year starting at age 83 if you have a 30-pack-year history of smoking and currently smoke or have quit within the past 15 years. Fecal occult blood test (FOBT) of the stool. You may have this test every year starting at age 72. Flexible sigmoidoscopy or colonoscopy. You may have a sigmoidoscopy every 5 years or a colonoscopy every 10 years starting at age 36. Hepatitis C blood test. Hepatitis B blood test. Sexually transmitted disease (STD) testing. Diabetes screening. This is done by checking your blood sugar (glucose) after you have not eaten for a while (fasting). You may have this done every 1-3 years. Bone density scan. This is done to screen for osteoporosis. You may have this done starting at age 66. Mammogram. This may be done every 1-2 years. Talk to your health care provider about how often you should have regular mammograms. Talk with your health care provider about your test results, treatment options, and if necessary, the need for more tests. Vaccines  Your health care provider may recommend certain vaccines, such as: Influenza vaccine. This is recommended every year. Tetanus, diphtheria, and acellular pertussis (Tdap, Td) vaccine. You may need a Td booster every 10 years. Zoster vaccine. You may need this after age 40. Pneumococcal 13-valent conjugate (PCV13) vaccine. One dose is recommended after age 81. Pneumococcal polysaccharide (PPSV23) vaccine. One dose is recommended after age 89. Talk to your health care provider about which  screenings and vaccines you need and how  often you need them. This information is not intended to replace advice given to you by your health care provider. Make sure you discuss any questions you have with your health care provider. Document Released: 07/25/2015 Document Revised: 03/17/2016 Document Reviewed: 04/29/2015 Elsevier Interactive Patient Education  2017 [employer] Prevention in the Home Falls can cause injuries. They can happen to people of all ages. There are many things you can do to make your home safe and to help prevent falls. What can I do on the outside of my home? Regularly fix the edges of walkways and driveways and fix any cracks. Remove anything that might make you trip as you walk through a door, such as a raised step or threshold. Trim any bushes or trees on the path to your home. Use bright outdoor lighting. Clear any walking paths of anything that might make someone trip, such as rocks or tools. Regularly check to see if handrails are loose or broken. Make sure that both sides of any steps have handrails. Any raised decks and porches should have guardrails on the edges. Have any leaves, snow, or ice cleared regularly. Use sand or salt on walking paths during winter. Clean up any spills in your garage right away. This includes oil or grease spills. What can I do in the bathroom? Use night lights. Install grab bars by the toilet and in the tub and shower. Do not use towel bars as grab bars. Use non-skid mats or decals in the tub or shower. If you need to sit down in the shower, use a plastic, non-slip stool. Keep the floor dry. Clean up any water that spills on the floor as soon as it happens. Remove soap buildup in the tub or shower regularly. Attach bath mats securely with double-sided non-slip rug tape. Do not have throw rugs and other things on the floor that can make you trip. What can I do in the bedroom? Use night lights. Make sure that you have a light by your bed that is easy to  reach. Do not use any sheets or blankets that are too big for your bed. They should not hang down onto the floor. Have a firm chair that has side arms. You can use this for support while you get dressed. Do not have throw rugs and other things on the floor that can make you trip. What can I do in the kitchen? Clean up any spills right away. Avoid walking on wet floors. Keep items that you use a lot in easy-to-reach places. If you need to reach something above you, use a strong step stool that has a grab bar. Keep electrical cords out of the way. Do not use floor polish or wax that makes floors slippery. If you must use wax, use non-skid floor wax. Do not have throw rugs and other things on the floor that can make you trip. What can I do with my stairs? Do not leave any items on the stairs. Make sure that there are handrails on both sides of the stairs and use them. Fix handrails that are broken or loose. Make sure that handrails are as long as the stairways. Check any carpeting to make sure that it is firmly attached to the stairs. Fix any carpet that is loose or worn. Avoid having throw rugs at the top or bottom of the stairs. If you do have throw rugs, attach them to the floor with carpet  tape. Make sure that you have a light switch at the top of the stairs and the bottom of the stairs. If you do not have them, ask someone to add them for you. What else can I do to help prevent falls? Wear shoes that: Do not have high heels. Have rubber bottoms. Are comfortable and fit you well. Are closed at the toe. Do not wear sandals. If you use a stepladder: Make sure that it is fully opened. Do not climb a closed stepladder. Make sure that both sides of the stepladder are locked into place. Ask someone to hold it for you, if possible. Clearly mark and make sure that you can see: Any grab bars or handrails. First and last steps. Where the edge of each step is. Use tools that help you move  around (mobility aids) if they are needed. These include: Canes. Walkers. Scooters. Crutches. Turn on the lights when you go into a dark area. Replace any light bulbs as soon as they burn out. Set up your furniture so you have a clear path. Avoid moving your furniture around. If any of your floors are uneven, fix them. If there are any pets around you, be aware of where they are. Review your medicines with your doctor. Some medicines can make you feel dizzy. This can increase your chance of falling. Ask your doctor what other things that you can do to help prevent falls. This information is not intended to replace advice given to you by your health care provider. Make sure you discuss any questions you have with your health care provider. Document Released: 04/24/2009 Document Revised: 12/04/2015 Document Reviewed: 08/02/2014 Elsevier Interactive Patient Education  2017 [employer].

## 2021-05-21 ENCOUNTER — Other Ambulatory Visit: Payer: PPO

## 2021-05-21 ENCOUNTER — Ambulatory Visit: Payer: PPO | Admitting: Oncology

## 2021-05-22 ENCOUNTER — Other Ambulatory Visit: Payer: Self-pay

## 2021-05-22 ENCOUNTER — Encounter: Payer: Self-pay | Admitting: Family Medicine

## 2021-05-22 ENCOUNTER — Telehealth: Payer: Self-pay

## 2021-05-22 ENCOUNTER — Inpatient Hospital Stay: Payer: PPO | Attending: Oncology

## 2021-05-22 ENCOUNTER — Inpatient Hospital Stay (HOSPITAL_BASED_OUTPATIENT_CLINIC_OR_DEPARTMENT_OTHER): Payer: PPO | Admitting: Oncology

## 2021-05-22 ENCOUNTER — Encounter: Payer: Self-pay | Admitting: Oncology

## 2021-05-22 VITALS — BP 104/70 | HR 63 | Temp 97.7°F | Resp 18 | Wt 149.5 lb

## 2021-05-22 DIAGNOSIS — K219 Gastro-esophageal reflux disease without esophagitis: Secondary | ICD-10-CM | POA: Diagnosis not present

## 2021-05-22 DIAGNOSIS — Z9221 Personal history of antineoplastic chemotherapy: Secondary | ICD-10-CM | POA: Diagnosis not present

## 2021-05-22 DIAGNOSIS — Z8589 Personal history of malignant neoplasm of other organs and systems: Secondary | ICD-10-CM

## 2021-05-22 DIAGNOSIS — Z8616 Personal history of COVID-19: Secondary | ICD-10-CM | POA: Diagnosis not present

## 2021-05-22 DIAGNOSIS — Z923 Personal history of irradiation: Secondary | ICD-10-CM | POA: Insufficient documentation

## 2021-05-22 DIAGNOSIS — Z08 Encounter for follow-up examination after completed treatment for malignant neoplasm: Secondary | ICD-10-CM

## 2021-05-22 DIAGNOSIS — Z950 Presence of cardiac pacemaker: Secondary | ICD-10-CM | POA: Insufficient documentation

## 2021-05-22 DIAGNOSIS — C01 Malignant neoplasm of base of tongue: Secondary | ICD-10-CM | POA: Insufficient documentation

## 2021-05-22 DIAGNOSIS — D696 Thrombocytopenia, unspecified: Secondary | ICD-10-CM | POA: Diagnosis not present

## 2021-05-22 DIAGNOSIS — E890 Postprocedural hypothyroidism: Secondary | ICD-10-CM | POA: Diagnosis not present

## 2021-05-22 DIAGNOSIS — K117 Disturbances of salivary secretion: Secondary | ICD-10-CM | POA: Diagnosis not present

## 2021-05-22 DIAGNOSIS — Z7901 Long term (current) use of anticoagulants: Secondary | ICD-10-CM | POA: Insufficient documentation

## 2021-05-22 DIAGNOSIS — Z79899 Other long term (current) drug therapy: Secondary | ICD-10-CM | POA: Diagnosis not present

## 2021-05-22 DIAGNOSIS — Z86718 Personal history of other venous thrombosis and embolism: Secondary | ICD-10-CM | POA: Insufficient documentation

## 2021-05-22 DIAGNOSIS — Z79890 Hormone replacement therapy (postmenopausal): Secondary | ICD-10-CM | POA: Diagnosis not present

## 2021-05-22 LAB — CBC WITH DIFFERENTIAL/PLATELET
Abs Immature Granulocytes: 0.01 10*3/uL (ref 0.00–0.07)
Basophils Absolute: 0.1 10*3/uL (ref 0.0–0.1)
Basophils Relative: 1 %
Eosinophils Absolute: 0.1 10*3/uL (ref 0.0–0.5)
Eosinophils Relative: 3 %
HCT: 39.8 % (ref 36.0–46.0)
Hemoglobin: 13.2 g/dL (ref 12.0–15.0)
Immature Granulocytes: 0 %
Lymphocytes Relative: 20 %
Lymphs Abs: 0.8 10*3/uL (ref 0.7–4.0)
MCH: 30.6 pg (ref 26.0–34.0)
MCHC: 33.2 g/dL (ref 30.0–36.0)
MCV: 92.3 fL (ref 80.0–100.0)
Monocytes Absolute: 0.4 10*3/uL (ref 0.1–1.0)
Monocytes Relative: 9 %
Neutro Abs: 2.7 10*3/uL (ref 1.7–7.7)
Neutrophils Relative %: 67 %
Platelets: 126 10*3/uL — ABNORMAL LOW (ref 150–400)
RBC: 4.31 MIL/uL (ref 3.87–5.11)
RDW: 12.3 % (ref 11.5–15.5)
WBC: 4 10*3/uL (ref 4.0–10.5)
nRBC: 0 % (ref 0.0–0.2)

## 2021-05-22 LAB — COMPREHENSIVE METABOLIC PANEL
ALT: 23 U/L (ref 0–44)
AST: 22 U/L (ref 15–41)
Albumin: 4 g/dL (ref 3.5–5.0)
Alkaline Phosphatase: 72 U/L (ref 38–126)
Anion gap: 8 (ref 5–15)
BUN: 23 mg/dL (ref 8–23)
CO2: 30 mmol/L (ref 22–32)
Calcium: 8.9 mg/dL (ref 8.9–10.3)
Chloride: 103 mmol/L (ref 98–111)
Creatinine, Ser: 0.62 mg/dL (ref 0.44–1.00)
GFR, Estimated: >60 mL/min (ref >60)
Glucose, Bld: 94 mg/dL (ref 70–99)
Potassium: 3.9 mmol/L (ref 3.5–5.1)
Sodium: 141 mmol/L (ref 135–145)
Total Bilirubin: 1.1 mg/dL (ref 0.3–1.2)
Total Protein: 6.4 g/dL — ABNORMAL LOW (ref 6.5–8.1)

## 2021-05-22 NOTE — Telephone Encounter (Signed)
What is going on here? What does this mean?

## 2021-05-22 NOTE — Telephone Encounter (Signed)
I got a mychart message- this is her compound hormone which we fill. Probably a mistake on the PEC. If they fax over an order I'll sign it.

## 2021-05-22 NOTE — Telephone Encounter (Signed)
Patient will need compound drug called into [facility], E3 1 mg and Testosterone 1 mg Compound, received request and unable to link medication.

## 2021-05-22 NOTE — Progress Notes (Signed)
Bloodwork: albumin : 2.9: October: pt is wondering about this value and states it is a little high; but she is a little concerned.

## 2021-05-22 NOTE — Telephone Encounter (Signed)
The pharmacy called to report that the refill request has been denied, pt has also requested this compound medication.   Caryl Pina called from [facility]

## 2021-05-24 ENCOUNTER — Encounter: Payer: Self-pay | Admitting: Oncology

## 2021-05-24 NOTE — Progress Notes (Signed)
Hematology/Oncology Consult note [facility]  [phone] [phone]  Patient Care Team: Valerie Roys, DO as PCP - General (Family Medicine) Minna Merritts, MD as Consulting Physician (Cardiology) Sindy Guadeloupe, MD as Consulting Physician (Oncology) Carloyn Manner, MD as Referring Physician (Otolaryngology) Noreene Filbert, MD as Referring Physician (Radiation Oncology) Rebekah Chesterfield, LCSW as Social Worker (Licensed Clinical Social Worker)   Name of the patient: Wendy Kelly  [id number]  [birth year]   Date of visit: 05/24/21  Diagnosis- 1. 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  She underwent concurrent chemoradiation with weekly cisplatin which she completed in February 2019.  She also had left lower extremity DVT after she finished chemotherapy and took Xarelto for 3 months following which she stopped taking it.  She continues to be in remission since then.  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  She has chronic xerostomia which she has been compensating with increased fluids as well as hard candy.  Denies any specific complaints at this time  ECOG PS- 0 Pain scale- 0   Review of systems- Review of Systems  Constitutional:  Negative for chills, fever, malaise/fatigue and weight loss.  HENT:  Negative for congestion, ear discharge and nosebleeds.   Eyes:  Negative for blurred vision.  Respiratory:  Negative for cough, hemoptysis, sputum production, shortness of breath and wheezing.   Cardiovascular:   Negative for chest pain, palpitations, orthopnea and claudication.  Gastrointestinal:  Negative for abdominal pain, blood in stool, constipation, diarrhea, heartburn, melena, nausea and vomiting.  Genitourinary:  Negative for dysuria, flank pain, frequency, hematuria and urgency.  Musculoskeletal:  Negative for back pain, joint pain and myalgias.  Skin:  Negative for rash.  Neurological:  Negative for dizziness, tingling, focal weakness, seizures, weakness and headaches.  Endo/Heme/Allergies:  Does not bruise/bleed easily.  Psychiatric/Behavioral:  Negative for depression and suicidal ideas. The patient does not have insomnia.      Allergies  Allergen Reactions   Cefdinir Swelling    Swelling in mouth    Cephalosporins Swelling   Dairy Aid [Tilactase] Swelling    Milk and milk products cause swelling and tingling of tongue   Flagyl [Metronidazole] Swelling    Mouth and throat and ears go red and burn   Lactose Intolerance (Gi) Other (See Comments)    GI upset.  Eats only gluten free   Penicillins Rash    Rash/hives   Phenylephrine-Guaifenesin Anaphylaxis    Night terrors   Sulfa Antibiotics Rash and Hives    Other reaction(s): Unknown    Sulfonamide Derivatives Rash    Rash/itching.  From head to her toes, the itching was horrible. Penicillin is the worst   Tape Rash    Adhesives all cause a problem severely.  It doesn't matter if it is paper tape or not.   Dexilant [Dexlansoprazole]     Pressure in head like head is in a vice and being squeezed   Entex Lq [Phenylephrine-Guaifenesin]     Night terrors   Other Other (See Comments)    Flu  vaccine:  Weakness, dizziness, tia type symptoms.     Oxycodone Nausea And Vomiting    And itching   Succinylcholine Other (See Comments)    Trouble waking up   Vancomycin Rash   Clarithromycin Itching and Other (See Comments)   Haemophilus Influenzae Vaccines Other (See Comments)    Stroke like symptoms   Tetracycline Other (See  Comments)    Unkown   Codeine Nausea And Vomiting   Guaifenesin & Derivatives Other (See Comments)    Night terrors   Lactobacillus Itching   Naproxen Sodium     Itching/rash   Pseudoephedrine Rash   Tetracyclines & Related Other (See Comments)    Was taking flagyl at the same time; unsure which caused the swelling  Swelling of throat. Pharmacist states that he believes it was the flagyl and not the tetracycline   Wheat Bran Other (See Comments)    Upset stomach. Now eats gluten free     Past Medical History:  Diagnosis Date   AA (aortic aneurysm) ([place])    a. 04/2011 Echo: Ao Root: 4.1cm, Asc Ao 4.7cm.   Anxiety    AV block, 2nd degree    a. 05/2005 - s/p MDT Adapta ADDR01 Dual Chamber PPM   Bicuspid aortic valve    Cancer ([place])    Throat   Chest pain    a. 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   COVID-19 virus infection 09/11/2019   Deep vein thrombosis (DVT) of left lower extremity ([place]) 10/03/2017   Dehydration 08/11/2017   Depression    Expressive aphasia    a. ongoing since 04/2011 - seen by neurology - ? TIA vs. Migraine   Facial numbness    a. ongoing since 04/2011 - seen by neurology - ? TIA vs. 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 04/2011 Echo: EF 55-65%, No RWMA, Gr 1 DD.   Thyroid disease      Past Surgical History:  Procedure Laterality Date   ABDOMINAL WALL MESH  REMOVAL     bladder tack     cataract Bilateral 2004   CHOLECYSTECTOMY  2000   COLONOSCOPY WITH PROPOFOL N/A 06/26/2019   Procedure: COLONOSCOPY WITH PROPOFOL;  Surgeon: Lucilla Lame, MD;  Location: [facility] ENDOSCOPY;  Service: Endoscopy;  Laterality: N/A;   DILATION AND CURETTAGE  OF UTERUS     DIRECT LARYNGOSCOPY Right 06/16/2017   Procedure: MICRO DIRECT LARYNGOSCOPY WITH BIOPSY OF RIGHT BASE OF TONGUE;  Surgeon: Carloyn Manner, MD;  Location: [facility] ORS;  Service: ENT;  Laterality: Right;   EP IMPLANTABLE DEVICE N/A 06/04/2015   Procedure: PPM Generator Changeout;  Surgeon: Deboraha Sprang, MD;  Location: [place] CV LAB;  Service: Cardiovascular;  Laterality: N/A;   ESOPHAGOGASTRODUODENOSCOPY (EGD) WITH PROPOFOL N/A 12/18/2015   Procedure: ESOPHAGOGASTRODUODENOSCOPY (EGD) WITH gastric biopsy and dilation;  Surgeon: Lucilla Lame, MD;  Location: [place];  Service: Endoscopy;  Laterality: N/A;   EYE SURGERY     INSERT / REPLACE / REMOVE PACEMAKER     IR FLUORO GUIDE PORT INSERTION RIGHT  06/22/2017   IR REMOVAL TUN  ACCESS W/ PORT W/O FL MOD SED  04/14/2018   MOHS SURGERY  02/2018   PACEMAKER INSERTION  2006   Medtronic Adapta ADDR01   TONSILLECTOMY     TONSILLECTOMY     VAGINAL HYSTERECTOMY  2013   mad/cope    Social History   Socioeconomic History   Marital status: Married    Spouse name: Not on file   Number of children: Not on file   Years of education: Not on file   Highest education level: Not on file  Occupational History   Not on file  Tobacco Use   Smoking status: Never   Smokeless tobacco: Never   Tobacco comments:    tobacco use -no  Vaping Use   Vaping Use: Never used  Substance and Sexual Activity   Alcohol use: No   Drug use: No   Sexual activity: Yes    Birth control/protection: Surgical  Other Topics Concern   Not on file  Social History Narrative   Lives in [place] with husband.  Works out regularly.  Works as a Geophysicist/field seismologist - owns own business.    Social Determinants of Health   Financial Resource Strain: Low Risk    Difficulty of Paying Living Expenses: Not hard at all  Food Insecurity: No Food Insecurity   Worried About Charity fundraiser in the Last Year: Never true   [place] in the Last  Year: Never true  Transportation Needs: No Transportation Needs   Lack of Transportation (Medical): No   Lack of Transportation (Non-Medical): No  Physical Activity: Sufficiently Active   Days of Exercise per Week: 4 days   Minutes of Exercise per Session: 40 min  Stress: No Stress Concern Present   Feeling of Stress : Not at all  Social Connections: Socially Integrated   Frequency of Communication with Friends and Family: More than three times a week   Frequency of Social Gatherings with Friends and Family: More than three times a week   Attends Religious Services: More than 4 times per year   Active Member of [employer] or Organizations: Yes   Attends Music therapist: More than 4 times per year   Marital Status: Married  Human resources officer Violence: Not At Risk   Fear of Current or Ex-Partner: No   Emotionally Abused: No   Physically Abused: No   Sexually Abused: No    Family History  Problem Relation Age of Onset   COPD Mother        alive @ 47   Atrial fibrillation Mother    Stroke Father        died @ 67   Breast cancer Maternal Aunt 70   COPD Maternal Grandmother    Ovarian cancer Neg Hx    Diabetes Neg Hx      Current Outpatient Medications:    aspirin 81 MG tablet, Take 81 mg by mouth daily., Disp: , Rfl:    azelastine (ASTELIN) 0.1 % nasal spray, SMARTSIG:1-2 Spray(s) Both Nares Every 12 Hours PRN, Disp: , Rfl:    b complex vitamins tablet, Take 2 tablets by mouth daily. , Disp: , Rfl:    BLACK ELDERBERRY,BERRY-FLOWER, PO, Take 2 capsules by mouth daily. , Disp: , Rfl:    Calcium Carb-Cholecalciferol (CALCIUM 500 + D3 PO), Take 2 Doses by mouth daily. , Disp: , Rfl:    CRANBERRY SOFT PO, Take 2 each by mouth daily. , Disp: , Rfl:    Digestive  Enzymes (DIGESTIVE ENZYME PO), Take 1-2 capsules by mouth daily., Disp: , Rfl:    levothyroxine (SYNTHROID) 75 MCG tablet, Take 1 tablet (75 mcg total) by mouth daily., Disp: 30 tablet, Rfl: 0   PRESCRIPTION  MEDICATION, Place 1 tablet vaginally 3 (three) times a week. E3 1mg  and Testosterone 1mg  compound, Disp: , Rfl:    sodium chloride (MURO 128) 5 % ophthalmic solution, Place 1 drop into both eyes daily., Disp: , Rfl:    Zinc 50 MG CAPS, Take 50 mg by mouth daily., Disp: , Rfl:  No current facility-administered medications for this visit.  Facility-Administered Medications Ordered in Other Visits:    0.9 %  sodium chloride infusion, , Intravenous, Once, Sindy Guadeloupe, MD  Physical exam:  Vitals:   05/22/21 1124  BP: 104/70  Pulse: 63  Resp: 18  Temp: 97.7 F (36.5 C)  SpO2: 97%  Weight: 149 lb 8 oz (67.8 kg)   Physical Exam Constitutional:      General: She is not in acute distress. HENT:     Mouth/Throat:     Mouth: Mucous membranes are moist.     Pharynx: Oropharynx is clear.  Cardiovascular:     Rate and Rhythm: Normal rate and regular rhythm.     Heart sounds: Normal heart sounds.  Pulmonary:     Effort: Pulmonary effort is normal.     Breath sounds: Normal breath sounds.  Abdominal:     General: Bowel sounds are normal.     Palpations: Abdomen is soft.  Lymphadenopathy:     Comments: No palpable cervical adenopathy  Skin:    General: Skin is warm and dry.  Neurological:     Mental Status: She is alert and oriented to person, place, and time.     CMP Latest Ref Rng & Units 05/22/2021  Glucose 70 - 99 mg/dL 94  BUN 8 - 23 mg/dL 23  Creatinine 0.44 - 1.00 mg/dL 0.62  Sodium 135 - 145 mmol/L 141  Potassium 3.5 - 5.1 mmol/L 3.9  Chloride 98 - 111 mmol/L 103  CO2 22 - 32 mmol/L 30  Calcium 8.9 - 10.3 mg/dL 8.9  Total Protein 6.5 - 8.1 g/dL 6.4(L)  Total Bilirubin 0.3 - 1.2 mg/dL 1.1  Alkaline Phos 38 - 126 U/L 72  AST 15 - 41 U/L 22  ALT 0 - 44 U/L 23   CBC Latest Ref Rng & Units 05/22/2021  WBC 4.0 - 10.5 K/uL 4.0  Hemoglobin 12.0 - 15.0 g/dL 13.2  Hematocrit 36.0 - 46.0 % 39.8  Platelets 150 - 400 K/uL 126(L)     Assessment and plan- Patient is a 65  y.o. female with a history of HPV positive squamous cell carcinoma of the oropharynx stage I status post concurrent chemoradiation and currently in remission.  This is a routine follow-up visit for head and neck cancer  Clinically patient is doing well with no concerning signs and symptoms of recurrence based on today's exam.  Patient is also following up with ENT regularly.  This is here for of surveillance.  I will see her back in 1 year and following that she does not require any follow-up with me.  Patient has chronic thrombocytopenia with a platelet count that fluctuates between 120s to 140s ever since she completed chemotherapy.  Overall stable continue to monitor   Visit Diagnosis 1. Encounter for follow-up surveillance of head and neck cancer      Dr. Randa Evens, MD, MPH [place] at [facility]  [facility] [phone] 05/24/2021 8:22 AM

## 2021-05-25 ENCOUNTER — Telehealth: Payer: PPO

## 2021-05-25 NOTE — Progress Notes (Signed)
I connected with  Wendy Kelly on 05-20-2021  by a telephone enabled telemedicine application and verified that I am speaking with the correct person using two identifiers.   I discussed the limitations of evaluation and management by telemedicine. The patient expressed understanding and agreed to proceed.  Patient location: home  Provider location:  tele-health (home)

## 2021-05-26 ENCOUNTER — Other Ambulatory Visit: Payer: Self-pay | Admitting: Family Medicine

## 2021-05-26 NOTE — Telephone Encounter (Signed)
Requested Prescriptions  Pending Prescriptions Disp Refills  . levothyroxine (SYNTHROID) 75 MCG tablet [Pharmacy Med Name: LEVOTHYROXINE 0.075MG  (75MCG) TABS] 90 tablet     Sig: TAKE 1 TABLET(75 MCG) BY MOUTH DAILY     Endocrinology:  Hypothyroid Agents Failed - 05/26/2021  9:09 AM      Failed - TSH needs to be rechecked within 3 months after an abnormal result. Refill until TSH is due.      Passed - TSH in normal range and within 360 days    TSH  Date Value Ref Range Status  05/01/2021 3.410 0.450 - 4.500 uIU/mL Final         Passed - Valid encounter within last 12 months    Recent Outpatient Visits          6 days ago Encounter for [employer] annual wellness exam   [place], Megan P, DO   3 weeks ago Routine general medical examination at a health care facility   [place], [place], DO   1 year ago [facility] to [facility] preventive visit   [place], [place], DO   1 year ago Acquired hypothyroidism   [place], Barb Merino, DO   1 year ago Genitourinary bleeding   [facility] Eulogio Bear, NP      Future Appointments            In 11 months Wynetta Emery, Barb Merino, DO [facility], [facility]

## 2021-05-27 NOTE — Telephone Encounter (Signed)
Spoke with pharmacy at [facility], they will fax over refill request. Will place in provider folder when we receive it.

## 2021-05-28 ENCOUNTER — Other Ambulatory Visit: Payer: PPO

## 2021-06-01 ENCOUNTER — Ambulatory Visit: Payer: PPO | Admitting: Cardiovascular Disease

## 2021-06-01 NOTE — Telephone Encounter (Signed)
Please confirm refill request is in my folder for me to sign thanks.

## 2021-06-03 ENCOUNTER — Other Ambulatory Visit: Payer: Self-pay

## 2021-06-03 ENCOUNTER — Ambulatory Visit
Admission: RE | Admit: 2021-06-03 | Discharge: 2021-06-03 | Disposition: A | Discharge status: Home / Self Care | Payer: PPO | Source: Ambulatory Visit | Attending: Family Medicine | Admitting: Family Medicine

## 2021-06-03 DIAGNOSIS — M85852 Other specified disorders of bone density and structure, left thigh: Secondary | ICD-10-CM | POA: Diagnosis not present

## 2021-06-03 DIAGNOSIS — Z1231 Encounter for screening mammogram for malignant neoplasm of breast: Secondary | ICD-10-CM | POA: Insufficient documentation

## 2021-06-03 DIAGNOSIS — Z13820 Encounter for screening for osteoporosis: Secondary | ICD-10-CM | POA: Diagnosis not present

## 2021-06-03 DIAGNOSIS — M8589 Other specified disorders of bone density and structure, multiple sites: Secondary | ICD-10-CM | POA: Diagnosis not present

## 2021-06-03 DIAGNOSIS — M85832 Other specified disorders of bone density and structure, left forearm: Secondary | ICD-10-CM | POA: Diagnosis not present

## 2021-06-09 ENCOUNTER — Ambulatory Visit (INDEPENDENT_AMBULATORY_CARE_PROVIDER_SITE_OTHER): Payer: PPO

## 2021-06-09 DIAGNOSIS — I441 Atrioventricular block, second degree: Secondary | ICD-10-CM

## 2021-06-09 DIAGNOSIS — J301 Allergic rhinitis due to pollen: Secondary | ICD-10-CM | POA: Diagnosis not present

## 2021-06-09 DIAGNOSIS — Z85819 Personal history of malignant neoplasm of unspecified site of lip, oral cavity, and pharynx: Secondary | ICD-10-CM | POA: Diagnosis not present

## 2021-06-09 DIAGNOSIS — H6123 Impacted cerumen, bilateral: Secondary | ICD-10-CM | POA: Diagnosis not present

## 2021-06-09 DIAGNOSIS — R519 Headache, unspecified: Secondary | ICD-10-CM | POA: Diagnosis not present

## 2021-06-09 LAB — CUP PACEART REMOTE DEVICE CHECK
Battery Impedance: 261 Ohm
Battery Remaining Longevity: 126 mo
Battery Voltage: 2.79 V
Brady Statistic AP VP Percent: 0 %
Brady Statistic AP VS Percent: 18 %
Brady Statistic AS VP Percent: 0 %
Brady Statistic AS VS Percent: 82 %
Date Time Interrogation Session: 20221129053724
Implantable Lead Implant Date: 20061128
Implantable Lead Implant Date: 20061128
Implantable Lead Location: 753859
Implantable Lead Location: 753860
Implantable Lead Model: 4092
Implantable Lead Model: 4592
Implantable Pulse Generator Implant Date: 20161123
Lead Channel Impedance Value: 669 Ohm
Lead Channel Impedance Value: 734 Ohm
Lead Channel Pacing Threshold Amplitude: 0.5 V
Lead Channel Pacing Threshold Amplitude: 1 V
Lead Channel Pacing Threshold Pulse Width: 0.4 ms
Lead Channel Pacing Threshold Pulse Width: 0.4 ms
Lead Channel Setting Pacing Amplitude: 2 V
Lead Channel Setting Pacing Amplitude: 2.5 V
Lead Channel Setting Pacing Pulse Width: 0.4 ms
Lead Channel Setting Sensing Sensitivity: 2.8 mV

## 2021-06-18 NOTE — Progress Notes (Signed)
Remote pacemaker transmission.   

## 2021-07-13 NOTE — Progress Notes (Signed)
Patient Care Team: Valerie Roys, DO as PCP - General (Family Medicine) Minna Merritts, MD as Consulting Physician (Cardiology) Sindy Guadeloupe, MD as Consulting Physician (Oncology) Carloyn Manner, MD as Referring Physician (Otolaryngology) Noreene Filbert, MD as Referring Physician (Radiation Oncology) Rebekah [place]field, LCSW as Social Worker (Licensed Clinical Social Worker)   HPI  Wendy Kelly is a 66 y.o. female Seen in followup for a pacemaker implanted for syncope with intermittent second degree heart block. She received a Medtronic adapta device in 2006 and underwent generator replacement 11/16.      Hx of TIA,  Orthostatic intolerance and aortic aneurysm   Date  Aorta  12/15 CTA 4.6  6/18 CTA 4.8  5/19 PET 4.3  7/20 CTA 4.8  8/22 CTA 4.6    Cancer therapy was complicated by DVT for which she took anticoagulation for 6 months.  The patient denies chest pain, shortness of breath, nocturnal dyspne, orthopnea or peripheral edema.  There have been no palpitations, lightheadedness or syncope.   Extremely stressful situation at home with her 46 year old mother being cared for in the burdens of caretaking, working full-time.  Palliative care is not involved.  DATE TEST EF   12/15 echo  50-55  mod ascending aortic dilitation 36 mm  1/16 myoview   EF 40-"normal"  no ischemia  1/18 Echo  55-60                  Past Medical History:  Diagnosis Date   AA (aortic aneurysm) ([place])    a. 04/2011 Echo: Ao Root: 4.1cm, Asc Ao 4.7cm.   Anxiety    AV block, 2nd degree    a. 05/2005 - s/p MDT Adapta ADDR01 Dual Chamber PPM   Bicuspid aortic valve    Cancer ([place])    Throat   Chest pain    a. 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   COVID-19 virus infection 09/11/2019   Deep vein thrombosis (DVT) of left lower extremity ([place]) 10/03/2017   Dehydration 08/11/2017   Depression    Expressive aphasia    a. ongoing since 04/2011 - seen by neurology - ? TIA vs. Migraine   Facial  numbness    a. ongoing since 04/2011 - seen by neurology - ? TIA vs. 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 04/2011 Echo: EF 55-65%, No RWMA, Gr 1 DD.   Thyroid disease     Past Surgical History:  Procedure Laterality Date   ABDOMINAL WALL MESH  REMOVAL     bladder tack     cataract Bilateral 2004   CHOLECYSTECTOMY  2000   COLONOSCOPY WITH PROPOFOL N/A 06/26/2019   Procedure: COLONOSCOPY WITH PROPOFOL;  Surgeon: Lucilla Lame, MD;  Location: [facility] ENDOSCOPY;  Service: Endoscopy;  Laterality: N/A;   DILATION AND CURETTAGE OF UTERUS     DIRECT LARYNGOSCOPY Right 06/16/2017   Procedure: MICRO DIRECT LARYNGOSCOPY WITH BIOPSY OF RIGHT BASE OF TONGUE;  Surgeon: Carloyn Manner, MD;  Location: [facility] ORS;  Service: ENT;  Laterality: Right;   EP IMPLANTABLE DEVICE N/A  06/04/2015   Procedure: PPM Generator Changeout;  Surgeon: Deboraha Sprang, MD;  Location: [place] CV LAB;  Service: Cardiovascular;  Laterality: N/A;   ESOPHAGOGASTRODUODENOSCOPY (EGD) WITH PROPOFOL N/A 12/18/2015   Procedure: ESOPHAGOGASTRODUODENOSCOPY (EGD) WITH gastric biopsy and dilation;  Surgeon: Lucilla Lame, MD;  Location: [place];  Service: Endoscopy;  Laterality: N/A;   EYE SURGERY     INSERT / REPLACE / REMOVE PACEMAKER     IR FLUORO GUIDE PORT INSERTION RIGHT  06/22/2017   IR REMOVAL TUN ACCESS W/ PORT W/O FL MOD SED  04/14/2018   MOHS SURGERY  02/2018   PACEMAKER INSERTION  2006   Medtronic Adapta ADDR01   TONSILLECTOMY     TONSILLECTOMY     VAGINAL HYSTERECTOMY  2013   mad/cope    Current Outpatient Medications  Medication Sig Dispense Refill   aspirin 81 MG tablet  Take 81 mg by mouth daily.     azelastine (ASTELIN) 0.1 % nasal spray SMARTSIG:1-2 Spray(s) Both Nares Every 12 Hours PRN     b complex vitamins tablet Take 2 tablets by mouth daily.      BLACK ELDERBERRY,BERRY-FLOWER, PO Take 2 capsules by mouth daily.      Calcium Carb-Cholecalciferol (CALCIUM 500 + D3 PO) Take 2 Doses by mouth daily.      CRANBERRY SOFT PO Take 2 each by mouth daily.      Digestive Enzymes (DIGESTIVE ENZYME PO) Take 1-2 capsules by mouth daily.     levothyroxine (SYNTHROID) 75 MCG tablet TAKE 1 TABLET(75 MCG) BY MOUTH DAILY 90 tablet 1   PRESCRIPTION MEDICATION Place 1 tablet vaginally 3 (three) times a week. E3 1mg  and Testosterone 1mg  compound     sodium chloride (MURO 128) 5 % ophthalmic solution Place 1 drop into both eyes daily.     Zinc 50 MG CAPS Take 50 mg by mouth daily.     No current facility-administered medications for this visit.   Facility-Administered Medications Ordered in Other Visits  Medication Dose Route Frequency Provider Last Rate Last Admin   0.9 %  sodium chloride infusion   Intravenous Once Sindy Guadeloupe, MD        Allergies  Allergen Reactions   Cefdinir Swelling    Swelling in mouth    Cephalosporins Swelling   Dairy Aid [Tilactase] Swelling    Milk and milk products cause swelling and tingling of tongue   Flagyl [Metronidazole] Swelling    Mouth and throat and ears go red and burn   Lactose Intolerance (Gi) Other (See Comments)    GI upset.  Eats only gluten free   Penicillins Rash    Rash/hives   Phenylephrine-Guaifenesin Anaphylaxis    Night terrors   Sulfa Antibiotics Rash and Hives    Other reaction(s): Unknown    Sulfonamide Derivatives Rash    Rash/itching.  From head to her toes, the itching was horrible. Penicillin is the worst   Tape Rash    Adhesives all cause a problem severely.  It doesn't matter if it is paper tape or not.   Dexilant [Dexlansoprazole]     Pressure in head like head is in a vice and being  squeezed   Entex Lq [Phenylephrine-Guaifenesin]     Night terrors   Other Other (See Comments)    Flu vaccine:  Weakness, dizziness, tia type symptoms.     Oxycodone Nausea And Vomiting    And itching   Succinylcholine Other (See Comments)    Trouble waking  up   Vancomycin Rash   Clarithromycin Itching and Other (See Comments)   Haemophilus Influenzae Vaccines Other (See Comments)    Stroke like symptoms   Tetracycline Other (See Comments)    Unkown   Codeine Nausea And Vomiting   Guaifenesin & Derivatives Other (See Comments)    Night terrors   Lactobacillus Itching   Naproxen Sodium     Itching/rash   Pseudoephedrine Rash   Tetracyclines & Related Other (See Comments)    Was taking flagyl at the same time; unsure which caused the swelling  Swelling of throat. Pharmacist states that he believes it was the flagyl and not the tetracycline   Wheat Bran Other (See Comments)    Upset stomach. Now eats gluten free    Review of Systems negative except from HPI and PMH  Physical Exam There were no vitals taken for this visit. Well developed and well nourished in no acute distress HENT normal Neck supple with JVP-flat Clear Device pocket well healed; without hematoma or erythema.  There is no tethering  Regular rate and rhythm, no  gallop No  murmur Abd-soft with active BS No Clubbing cyanosis  edema Skin-warm and dry A & Oriented  Grossly normal sensory and motor function  ECG sinus at 64 Interval 17/10/41-intermittent atrial pacing   Assessment and  Plan  Syncope  Intermittent second degree heart block  Pacemaker-[employer]    Ascending aortic aneurysm positive family history  TIA    Cancer S/P CHEMO XRT   Stress at home    No interval syncope.  Device function is normal.  We will continue her on aspirin 81  Lengthy discussion regarding the stresses at home.  Suggested reading Being Mortal.  Will reach out to her PCP regarding palliative care  support for her mom.

## 2021-07-14 ENCOUNTER — Encounter: Payer: Self-pay | Admitting: Internal Medicine

## 2021-07-14 ENCOUNTER — Ambulatory Visit (INDEPENDENT_AMBULATORY_CARE_PROVIDER_SITE_OTHER): Payer: PPO | Admitting: Internal Medicine

## 2021-07-14 ENCOUNTER — Other Ambulatory Visit: Payer: Self-pay

## 2021-07-14 VITALS — BP 110/70 | HR 64 | Ht 63.0 in | Wt 152.0 lb

## 2021-07-14 DIAGNOSIS — I441 Atrioventricular block, second degree: Secondary | ICD-10-CM

## 2021-07-14 DIAGNOSIS — Z950 Presence of cardiac pacemaker: Secondary | ICD-10-CM

## 2021-07-14 LAB — PACEMAKER DEVICE OBSERVATION

## 2021-07-14 NOTE — Patient Instructions (Signed)

## 2021-09-08 ENCOUNTER — Ambulatory Visit (INDEPENDENT_AMBULATORY_CARE_PROVIDER_SITE_OTHER): Payer: PPO

## 2021-09-08 DIAGNOSIS — I441 Atrioventricular block, second degree: Secondary | ICD-10-CM | POA: Diagnosis not present

## 2021-09-09 LAB — CUP PACEART REMOTE DEVICE CHECK
Battery Impedance: 311 Ohm
Battery Remaining Longevity: 120 mo
Battery Voltage: 2.79 V
Brady Statistic AP VP Percent: 0 %
Brady Statistic AP VS Percent: 17 %
Brady Statistic AS VP Percent: 0 %
Brady Statistic AS VS Percent: 83 %
Date Time Interrogation Session: 20230228054120
Implantable Lead Implant Date: 20061128
Implantable Lead Implant Date: 20061128
Implantable Lead Location: 753859
Implantable Lead Location: 753860
Implantable Lead Model: 4092
Implantable Lead Model: 4592
Implantable Pulse Generator Implant Date: 20161123
Lead Channel Impedance Value: 681 Ohm
Lead Channel Impedance Value: 767 Ohm
Lead Channel Pacing Threshold Amplitude: 0.5 V
Lead Channel Pacing Threshold Amplitude: 1 V
Lead Channel Pacing Threshold Pulse Width: 0.4 ms
Lead Channel Pacing Threshold Pulse Width: 0.4 ms
Lead Channel Setting Pacing Amplitude: 2 V
Lead Channel Setting Pacing Amplitude: 2.5 V
Lead Channel Setting Pacing Pulse Width: 0.4 ms
Lead Channel Setting Sensing Sensitivity: 2.8 mV

## 2021-09-16 ENCOUNTER — Encounter: Payer: Self-pay | Admitting: Family Medicine

## 2021-09-16 NOTE — Progress Notes (Signed)
Remote pacemaker transmission.   

## 2021-09-28 ENCOUNTER — Encounter: Payer: Self-pay | Admitting: Family Medicine

## 2021-09-28 ENCOUNTER — Other Ambulatory Visit: Payer: Self-pay

## 2021-09-28 ENCOUNTER — Ambulatory Visit (INDEPENDENT_AMBULATORY_CARE_PROVIDER_SITE_OTHER): Payer: PPO | Admitting: Family Medicine

## 2021-09-28 VITALS — BP 128/72 | HR 75 | Temp 98.2°F | Ht 62.99 in | Wt 152.4 lb

## 2021-09-28 DIAGNOSIS — F419 Anxiety disorder, unspecified: Secondary | ICD-10-CM | POA: Diagnosis not present

## 2021-09-28 DIAGNOSIS — N6315 Unspecified lump in the right breast, overlapping quadrants: Secondary | ICD-10-CM

## 2021-09-28 NOTE — Progress Notes (Signed)
? ?BP 128/72   Pulse 75   Temp 98.2 ?F (36.8 ?C) (Oral)   Ht 5' 2.99" (1.6 m)   Wt 152 lb 6.4 oz (69.1 kg)   SpO2 98%   BMI 27.00 kg/m?   ? ?Subjective:  ? ? Patient ID: Wendy Kelly, female    DOB: [birth year], 66 y.o.   MRN: [id number] ? ?HPI: ?Wendy Kelly is a 66 y.o. female ? ?Chief Complaint  ?Patient presents with  ? Breast Pain  ?  Pain comes and goes, stopped drinking caffeine. Patient states that much of the soreness and tightness is gone and just aching.    ? ?BREAST PAIN ?Duration: about a month ?Location: R upper middle ?Onset: gradual ?Severity: moderate ?Quality: sore and aching ?Frequency: intermittent ?Redness: no ?Swelling: no ?Trauma: no trauma ?Breastfeeding: no ?Associated with menstral cycle:  N/A ?Nipple discharge: no ?Breast lump: a change-? swelling ?Status: better ?Treatments attempted: none ?Previous mammogram: yes ? ?Has been under a lot of stress. Husband is having surgery and her mother is declining. Wants to try her oils, but is not doing great.  ? ?Relevant past medical, surgical, family and social history reviewed and updated as indicated. Interim medical history since our last visit reviewed. ?Allergies and medications reviewed and updated. ? ?Review of Systems  ?Constitutional: Negative.   ?Respiratory: Negative.    ?Cardiovascular: Negative.   ?Gastrointestinal: Negative.   ?Musculoskeletal: Negative.   ?Neurological: Negative.   ?Psychiatric/Behavioral: Negative.    ? ?Per HPI unless specifically indicated above ? ?   ?Objective:  ?  ?BP 128/72   Pulse 75   Temp 98.2 ?F (36.8 ?C) (Oral)   Ht 5' 2.99" (1.6 m)   Wt 152 lb 6.4 oz (69.1 kg)   SpO2 98%   BMI 27.00 kg/m?   ?Wt Readings from Last 3 Encounters:  ?09/28/21 152 lb 6.4 oz (69.1 kg)  ?07/14/21 152 lb (68.9 kg)  ?05/22/21 149 lb 8 oz (67.8 kg)  ?  ?Physical Exam ?Vitals and nursing note reviewed.  ?Constitutional:   ?   General: She is not in acute distress. ?   Appearance: Normal appearance. She is normal weight.  She is not ill-appearing, toxic-appearing or diaphoretic.  ?HENT:  ?   Head: Normocephalic and atraumatic.  ?   Right Ear: External ear normal.  ?   Left Ear: External ear normal.  ?   Nose: Nose normal.  ?   Mouth/Throat:  ?   Mouth: Mucous membranes are moist.  ?   Pharynx: Oropharynx is clear.  ?Eyes:  ?   General: No scleral icterus.    ?   Right eye: No discharge.     ?   Left eye: No discharge.  ?   Extraocular Movements: Extraocular movements intact.  ?   Conjunctiva/sclera: Conjunctivae normal.  ?   Pupils: Pupils are equal, round, and reactive to light.  ?Cardiovascular:  ?   Rate and Rhythm: Normal rate and regular rhythm.  ?   Pulses: Normal pulses.  ?   Heart sounds: Normal heart sounds. No murmur heard. ?  No friction rub. No gallop.  ?Pulmonary:  ?   Effort: Pulmonary effort is normal. No respiratory distress.  ?   Breath sounds: Normal breath sounds. No stridor. No wheezing, rhonchi or rales.  ?Chest:  ?   Chest wall: No tenderness.  ?Breasts: ?   Left: Normal.  ? ? ?Musculoskeletal:     ?   General: Normal range of motion.  ?  Cervical back: Normal range of motion and neck supple.  ?Skin: ?   General: Skin is warm and dry.  ?   Capillary Refill: Capillary refill takes less than 2 seconds.  ?   Coloration: Skin is not jaundiced or pale.  ?   Findings: No bruising, erythema, lesion or rash.  ?Neurological:  ?   General: No focal deficit present.  ?   Mental Status: She is alert and oriented to person, place, and time. Mental status is at baseline.  ?Psychiatric:     ?   Mood and Affect: Mood normal.     ?   Behavior: Behavior normal.     ?   Thought Content: Thought content normal.     ?   Judgment: Judgment normal.  ? ? ?Results for orders placed or performed in visit on 09/08/21  ?CUP PACEART REMOTE DEVICE CHECK  ?Result Value Ref Range  ? Date Time Interrogation Session [phone]   ? Pulse Insurance claims handler MERM   ? Pulse Gen Model [id number] Adapta   ? Pulse Gen Serial Number [id number]   ?  Clinic Name [facility]   ? Implantable Pulse Generator Type Implantable Pulse Generator   ? Implantable Pulse Generator Implant Date [id number]   ? Implantable Lead Manufacturer MERM   ? Implantable Lead Model 4092 CapSure SP Novus   ? Implantable Lead Serial Number [id number]   ? Implantable Lead Implant Date [id number]   ? Implantable Lead Location [phone]   ? Implantable Lead Manufacturer MERM   ? Implantable Lead Model 4592 CapSure SP Novus   ? Implantable Lead Serial Number [id number]   ? Implantable Lead Implant Date [id number]   ? Implantable Lead Location [id number]   ? Lead Channel Setting Sensing Sensitivity 2.80 mV  ? Lead Channel Setting Pacing Amplitude 2.000 V  ? Lead Channel Setting Pacing Pulse Width 0.40 ms  ? Lead Channel Setting Pacing Amplitude 2.500 V  ? Lead Channel Impedance Value 681 ohm  ? Lead Channel Pacing Threshold Amplitude 0.500 V  ? Lead Channel Pacing Threshold Pulse Width 0.40 ms  ? Lead Channel Impedance Value 767 ohm  ? Lead Channel Pacing Threshold Amplitude 1.000 V  ? Lead Channel Pacing Threshold Pulse Width 0.40 ms  ? Battery Status OK   ? Battery Remaining Longevity 120 mo  ? Battery Voltage 2.79 V  ? Battery Impedance 311 ohm  ? Brady Statistic AP VP Percent 0 %  ? Brady Statistic AS VP Percent 0 %  ? Brady Statistic AP VS Percent 17 %  ? Brady Statistic AS VS Percent 83 %  ? ?   ?Assessment & Plan:  ? ?Problem List Items Addressed This Visit   ?None ?Visit Diagnoses   ? ? Breast lump on right side at 12 o'clock position    -  Primary  ? Will get [place] and mammo. Await results. Treat as needed.   ? Relevant Orders  ? MM DIAG BREAST TOMO BILATERAL  ? US BREAST LTD UNI RIGHT INC AXILLA  ? MM DIAG BREAST TOMO UNI RIGHT  ? Acute anxiety      ? A lot of stuff going on at home. Will monitor and try her oils. If not improving, Will consider short course of PRN anxiety medicine.   ? ?  ?  ? ?Follow up plan: ?Return if symptoms worsen or fail to improve. ? ? ? ? ? ?

## 2021-10-14 ENCOUNTER — Ambulatory Visit
Admission: RE | Admit: 2021-10-14 | Discharge: 2021-10-14 | Disposition: A | Discharge status: Home / Self Care | Payer: PPO | Source: Ambulatory Visit | Attending: Family Medicine | Admitting: Family Medicine

## 2021-10-14 DIAGNOSIS — N6315 Unspecified lump in the right breast, overlapping quadrants: Secondary | ICD-10-CM | POA: Insufficient documentation

## 2021-10-14 DIAGNOSIS — R922 Inconclusive mammogram: Secondary | ICD-10-CM | POA: Diagnosis not present

## 2021-10-14 DIAGNOSIS — N6311 Unspecified lump in the right breast, upper outer quadrant: Secondary | ICD-10-CM | POA: Diagnosis not present

## 2021-11-15 ENCOUNTER — Other Ambulatory Visit: Payer: Self-pay | Admitting: Family Medicine

## 2021-11-16 NOTE — Telephone Encounter (Signed)
Requested Prescriptions  ?Pending Prescriptions Disp Refills  ?? levothyroxine (SYNTHROID) 75 MCG tablet [Pharmacy Med Name: LEVOTHYROXINE 0.'075MG'$  (75MCG) TABS] 90 tablet 1  ?  Sig: TAKE 1 TABLET(75 MCG) BY MOUTH DAILY  ?  ? Endocrinology:  Hypothyroid Agents Passed - 11/15/2021 10:27 AM  ?  ?  Passed - TSH in normal range and within 360 days  ?  TSH  ?Date Value Ref Range Status  ?05/01/2021 3.410 0.450 - 4.500 uIU/mL Final  ?   ?  ?  Passed - Valid encounter within last 12 months  ?  Recent Outpatient Visits   ?      ? 1 month ago Breast lump on right side at 12 o'clock position  ? [place], Megan P, DO  ? 6 months ago Encounter for [employer] annual wellness exam  ? [place], DO  ? 6 months ago Routine general medical examination at a health care facility  ? [place], DO  ? 1 year ago Welcome to Medicare preventive visit  ? [place], Megan P, DO  ? 1 year ago Acquired hypothyroidism  ? [place], [place] P, DO  ?  ?  ?Future Appointments   ?        ? In 5 months Wynetta Emery, Barb Merino, DO [facility], [facility]  ?  ? ?  ?  ?  ? ?

## 2021-11-17 ENCOUNTER — Encounter: Payer: Self-pay | Admitting: Internal Medicine

## 2021-12-08 ENCOUNTER — Ambulatory Visit (INDEPENDENT_AMBULATORY_CARE_PROVIDER_SITE_OTHER): Payer: PPO

## 2021-12-08 DIAGNOSIS — I441 Atrioventricular block, second degree: Secondary | ICD-10-CM | POA: Diagnosis not present

## 2021-12-09 LAB — CUP PACEART REMOTE DEVICE CHECK
Battery Impedance: 335 Ohm
Battery Remaining Longevity: 117 mo
Battery Voltage: 2.78 V
Brady Statistic AP VP Percent: 0 %
Brady Statistic AP VS Percent: 17 %
Brady Statistic AS VP Percent: 0 %
Brady Statistic AS VS Percent: 83 %
Date Time Interrogation Session: 20230530213637
Implantable Lead Implant Date: 20061128
Implantable Lead Implant Date: 20061128
Implantable Lead Location: 753859
Implantable Lead Location: 753860
Implantable Lead Model: 4092
Implantable Lead Model: 4592
Implantable Pulse Generator Implant Date: 20161123
Lead Channel Impedance Value: 646 Ohm
Lead Channel Impedance Value: 772 Ohm
Lead Channel Pacing Threshold Amplitude: 0.625 V
Lead Channel Pacing Threshold Amplitude: 1.125 V
Lead Channel Pacing Threshold Pulse Width: 0.4 ms
Lead Channel Pacing Threshold Pulse Width: 0.4 ms
Lead Channel Setting Pacing Amplitude: 2 V
Lead Channel Setting Pacing Amplitude: 2.5 V
Lead Channel Setting Pacing Pulse Width: 0.4 ms
Lead Channel Setting Sensing Sensitivity: 2.8 mV

## 2021-12-11 DIAGNOSIS — Z85819 Personal history of malignant neoplasm of unspecified site of lip, oral cavity, and pharynx: Secondary | ICD-10-CM | POA: Diagnosis not present

## 2021-12-11 DIAGNOSIS — J301 Allergic rhinitis due to pollen: Secondary | ICD-10-CM | POA: Diagnosis not present

## 2021-12-11 DIAGNOSIS — R519 Headache, unspecified: Secondary | ICD-10-CM | POA: Diagnosis not present

## 2021-12-22 NOTE — Progress Notes (Signed)
Remote pacemaker transmission.   

## 2021-12-29 DIAGNOSIS — Z961 Presence of intraocular lens: Secondary | ICD-10-CM | POA: Diagnosis not present

## 2022-01-06 DIAGNOSIS — L570 Actinic keratosis: Secondary | ICD-10-CM | POA: Diagnosis not present

## 2022-01-06 DIAGNOSIS — R208 Other disturbances of skin sensation: Secondary | ICD-10-CM | POA: Diagnosis not present

## 2022-01-06 DIAGNOSIS — D2272 Melanocytic nevi of left lower limb, including hip: Secondary | ICD-10-CM | POA: Diagnosis not present

## 2022-01-06 DIAGNOSIS — Z85828 Personal history of other malignant neoplasm of skin: Secondary | ICD-10-CM | POA: Diagnosis not present

## 2022-01-06 DIAGNOSIS — D2262 Melanocytic nevi of left upper limb, including shoulder: Secondary | ICD-10-CM | POA: Diagnosis not present

## 2022-01-06 DIAGNOSIS — D2261 Melanocytic nevi of right upper limb, including shoulder: Secondary | ICD-10-CM | POA: Diagnosis not present

## 2022-01-06 DIAGNOSIS — L538 Other specified erythematous conditions: Secondary | ICD-10-CM | POA: Diagnosis not present

## 2022-01-06 DIAGNOSIS — D225 Melanocytic nevi of trunk: Secondary | ICD-10-CM | POA: Diagnosis not present

## 2022-01-06 DIAGNOSIS — X32XXXA Exposure to sunlight, initial encounter: Secondary | ICD-10-CM | POA: Diagnosis not present

## 2022-01-06 DIAGNOSIS — L820 Inflamed seborrheic keratosis: Secondary | ICD-10-CM | POA: Diagnosis not present

## 2022-02-12 ENCOUNTER — Other Ambulatory Visit: Payer: Self-pay | Admitting: *Deleted

## 2022-02-12 DIAGNOSIS — I7121 Aneurysm of the ascending aorta, without rupture: Secondary | ICD-10-CM

## 2022-02-21 ENCOUNTER — Other Ambulatory Visit: Payer: Self-pay | Admitting: Family Medicine

## 2022-02-22 NOTE — Telephone Encounter (Signed)
Requested medication (s) are due for refill today:   No  Requested medication (s) are on the active medication list:   Yes  Future visit scheduled:   Yes   Last ordered: 11/16/2021 #90, 1 refill  Returned because no refills remain on this rx.   Requested Prescriptions  Pending Prescriptions Disp Refills   levothyroxine (SYNTHROID) 75 MCG tablet [Pharmacy Med Name: LEVOTHYROXINE 0.'075MG'$  (75MCG) TABS] 90 tablet 1    Sig: TAKE 1 TABLET(75 MCG) BY MOUTH DAILY     Endocrinology:  Hypothyroid Agents Passed - 02/21/2022  8:04 AM      Passed - TSH in normal range and within 360 days    TSH  Date Value Ref Range Status  05/01/2021 3.410 0.450 - 4.500 uIU/mL Final         Passed - Valid encounter within last 12 months    Recent Outpatient Visits           4 months ago Breast lump on right side at 12 o'clock position   [facility], Megan P, DO   9 months ago Encounter for [employer] annual wellness exam   [employer], Megan P, DO   9 months ago Routine general medical examination at a health care facility   [place], [place], DO   1 year ago Welcome to [employer] preventive visit   [place], [place], DO   2 years ago Acquired hypothyroidism   [facility] Valerie Roys, DO       Future Appointments             In 2 weeks Goll[place], Kathlene November, MD [facility]   In 2 months Wynetta Emery, Barb Merino, DO [employer], [place]

## 2022-03-09 ENCOUNTER — Ambulatory Visit (INDEPENDENT_AMBULATORY_CARE_PROVIDER_SITE_OTHER): Payer: PPO

## 2022-03-09 DIAGNOSIS — I441 Atrioventricular block, second degree: Secondary | ICD-10-CM

## 2022-03-10 LAB — CUP PACEART REMOTE DEVICE CHECK
Battery Impedance: 360 Ohm
Battery Remaining Longevity: 113 mo
Battery Voltage: 2.78 V
Brady Statistic AP VP Percent: 0 %
Brady Statistic AP VS Percent: 17 %
Brady Statistic AS VP Percent: 0 %
Brady Statistic AS VS Percent: 83 %
Date Time Interrogation Session: 20230829053834
Implantable Lead Implant Date: 20061128
Implantable Lead Implant Date: 20061128
Implantable Lead Location: 753859
Implantable Lead Location: 753860
Implantable Lead Model: 4092
Implantable Lead Model: 4592
Implantable Pulse Generator Implant Date: 20161123
Lead Channel Impedance Value: 644 Ohm
Lead Channel Impedance Value: 727 Ohm
Lead Channel Pacing Threshold Amplitude: 0.5 V
Lead Channel Pacing Threshold Amplitude: 1.25 V
Lead Channel Pacing Threshold Pulse Width: 0.4 ms
Lead Channel Pacing Threshold Pulse Width: 0.4 ms
Lead Channel Setting Pacing Amplitude: 2 V
Lead Channel Setting Pacing Amplitude: 2.5 V
Lead Channel Setting Pacing Pulse Width: 0.4 ms
Lead Channel Setting Sensing Sensitivity: 4 mV

## 2022-03-10 NOTE — Progress Notes (Signed)
Date:  03/12/2022   ID:  [name], DOB 12/11/1955, MRN [id number]  Patient Location:  [place] [address]   Provider location:   [facility], [place] office  PCP:  Valerie Roys, DO  Cardiologist:  Patsy [place]   Chief Complaint  Patient presents with   12 month follow up     "Doing well." Medications reviewed by the patient verbally.      History of Present Illness:    Wendy Kelly is a 66 y.o. 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, completed therapy, dx 5 yrs ago, clear She presents for routine followup of her chest pain and ascending aorta dilation  Last seen in clinic August 2022  Last echocardiogram 2018 Last CT chest August 2022 Scheduled for repeat CT chest next week, follow-up with CT surgery on that day  stressors at home Takes care of mom, age 59 Full time massage practice, very busy Takes care of grandson 43 yo  and granddaughter  19 mons old on Thursdays  Long history of low blood pressure Denies significant orthostasis symptoms In the past she has declined midodrine  If she does have lightheaded spells she increases her salt intake on fluids   Normal functioning pacer, followed by Dr. Caryl Comes  Echo in 2015 and 2018: called trileaflet aortic valve On review of images, still not well visualized  EKG personally reviewed by myself on todays visit Normal sinus rhythm rate 64 bpm no significant ST-T wave changes  Other past medical history reviewed covid 09/2019: had  infusion Mom and husband had it as well Had antibody infusion at [place] blood pressure was lower following infusion  CT chest 02/05/2019 Stable dilatation of the ascending thoracic aorta with a maximum transverse diameter 4.8 x 4.8 cm. No evident dissection. 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  Treated with Xarelto  Last CT chest December 2015, aorta 4.6 cm  No change compared to CT scan August 2011, at that time 4.6 x 4.8 cm  Son diagnosed with dilated ascending aorta 4.2 cm, he is out of [place]   CT scan in November 2013.  4.7 cm ascending aorta at that time   Past Medical History:  Diagnosis Date   AA (aortic aneurysm) ([place])    a. 04/2011 Echo: Ao Root: 4.1cm, Asc Ao 4.7cm.   Anxiety    AV block, 2nd degree    a. 05/2005 - s/p MDT Adapta ADDR01 Dual Chamber PPM   Bicuspid aortic valve    Cancer ([place])    Throat   Chest pain    a. 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   COVID-19 virus infection 09/11/2019   Deep vein thrombosis (DVT) of  left lower extremity ([place]) 10/03/2017   Dehydration 08/11/2017   Depression    Expressive aphasia    a. ongoing since 04/2011 - seen by neurology - ? TIA vs. Migraine   Facial numbness    a. ongoing since 04/2011 - seen by neurology - ? TIA vs. Migraine   GERD (gastroesophageal reflux disease)    Low blood pressure    Personal history of chemotherapy 2018-2019   Throat   Personal history of radiation therapy 2018-2019   Throat   Presence of permanent cardiac pacemaker    Shingles    pt had on right side of head and his neck   Squamous cell carcinoma 05/2017   lymph node right side of neck    Syncope    a. 04/2011 Echo: EF 55-65%, No RWMA, Gr 1 DD.   Thyroid disease    Past Surgical History:  Procedure Laterality Date   ABDOMINAL WALL MESH  REMOVAL     bladder tack     cataract Bilateral 2004   CHOLECYSTECTOMY  2000   COLONOSCOPY WITH PROPOFOL N/A 06/26/2019   Procedure: COLONOSCOPY WITH PROPOFOL;  Surgeon: Lucilla Lame, MD;  [facility] END[facility]COPY;  Service: Endoscopy;  Laterality: N/A;   DILATION AND CURETTAGE OF UTERUS     DIRECT LARYNGOSCOPY Right 06/16/2017   Procedure: MICRO DIRECT LARYNGOSCOPY WITH BIOPSY OF RIGHT BASE OF TONGUE;  Surgeon: Carloyn Manner, MD;  Location: [facility] ORS;  Service: ENT;  Laterality: Right;   EP IMPLANTABLE DEVICE N/A 06/04/2015   Procedure: PPM Generator Changeout;  Surgeon: Deboraha Sprang, MD;  Location: [place] CV LAB;  Service: Cardiovascular;  Laterality: N/A;   ESOPHAGOGASTRODUODENOSCOPY (EGD) WITH PROPOFOL N/A 12/18/2015   Procedure: ESOPHAGOGASTRODUODENOSCOPY (EGD) WITH gastric biopsy and dilation;  Surgeon: Lucilla Lame, MD;  Location: [place];  Service: Endoscopy;  Laterality: N/A;   EYE SURGERY     INSERT / REPLACE / REMOVE PACEMAKER     IR FLUORO GUIDE PORT INSERTION RIGHT  06/22/2017   IR REMOVAL TUN ACCESS W/ PORT W/O FL MOD SED  04/14/2018   MOHS SURGERY  02/2018   PACEMAKER INSERTION  2006   Medtronic Adapta ADDR01   TONSILLECTOMY     TONSILLECTOMY     VAGINAL HYSTERECTOMY  2013   mad/cope      Allergies:   Cefdinir, Cephalosporins, Dairy aid [tilactase], Flagyl [metronidazole], Lactose intolerance (gi), Penicillins, Phenylephrine-guaifenesin, Sulfa antibiotics, Sulfonamide derivatives, Tape, Dexilant [dexlansoprazole], Entex lq [phenylephrine-guaifenesin], Other, Oxycodone, Succinylcholine, Vancomycin, Clarithromycin, Haemophilus influenzae vaccines, Tetracycline, Codeine, Guaifenesin & derivatives, Lactobacillus, Naproxen sodium, Pseudoephedrine, Tetracyclines & related, and Wheat bran   Social History    Tobacco Use   Smoking status: Never   Smokeless tobacco: Never   Tobacco comments:    tobacco use -no  Vaping Use   Vaping Use: Never used  Substance Use Topics   Alcohol use: No   Drug use: No     Current Outpatient Medications on File Prior to Visit  Medication Sig Dispense Refill   aspirin 81 MG tablet Take 81 mg by mouth daily.     azelastine (ASTELIN) 0.1 % nasal spray SMARTSIG:1-2 Spray(s) Both Nares Every 12 Hours PRN     b complex vitamins tablet Take 2 tablets by mouth daily.     BLACK ELDERBERRY,BERRY-FLOWER, PO Take 2 capsules by mouth daily.      Calcium Carb-Cholecalciferol (CALCIUM 500 + D3 PO) Take 2 Doses by mouth daily.      CRANBERRY SOFT PO Take 2  each by mouth daily.      Digestive Enzymes (DIGESTIVE ENZYME PO) Take 1-2 capsules by mouth daily.     levothyroxine (SYNTHROID) 75 MCG tablet TAKE 1 TABLET(75 MCG) BY MOUTH DAILY 90 tablet 1   PRESCRIPTION MEDICATION Place 1 tablet vaginally 3 (three) times a week. E3 '1mg'$  and Testosterone '1mg'$  compound     sodium chloride (MURO 128) 5 % ophthalmic solution Place 1 drop into both eyes daily.     Zinc 50 MG CAPS Take 50 mg by mouth daily.     Current Facility-Administered Medications on File Prior to Visit  Medication Dose Route Frequency Provider Last Rate Last Admin   0.9 %  sodium chloride infusion   Intravenous Once Sindy Guadel[place], MD         Family Hx: The patient's family history includes Atrial fibrillation in her mother; Breast cancer (age of onset: 69) in her maternal aunt; COPD in her maternal grandmother and mother; Stroke in her father. There is no history of Ovarian cancer or Diabetes.  ROS:   Please see the history of present illness.    Review of Systems  Constitutional: Negative.   HENT: Negative.    Respiratory: Negative.    Cardiovascular: Negative.   Gastrointestinal: Negative.   Musculoskeletal: Negative.   Neurological: Negative.   Psychiatric/Behavioral: Negative.    All other systems  reviewed and are negative.   Labs/Other Tests and Data Reviewed:    Recent Labs: 05/01/2021: TSH 3.410 05/22/2021: ALT 23; BUN 23; Creatinine, Ser 0.62; Hemoglobin 13.2; Platelets 126; Potassium 3.9; Sodium 141   Recent Lipid Panel Lab Results  Component Value Date/Time   CHOL 164 05/01/2021 08:29 AM   TRIG 37 05/01/2021 08:29 AM   HDL 68 05/01/2021 08:29 AM   CHOLHDL 2.8 06/30/2014 05:47 PM   LDLCALC 88 05/01/2021 08:29 AM    Wt Readings from Last 3 Encounters:  03/12/22 155 lb 2 oz (70.4 kg)  09/28/21 152 lb 6.4 oz (69.1 kg)  07/14/21 152 lb (68.9 kg)     Exam:    Vital Signs: Vital signs may also be detailed in the HPI BP 90/66 (BP Location: Left Arm, Patient Position: Sitting, Cuff Size: Normal)   Pulse 64   Ht '5\' 3"'$  (1.6 m)   Wt 155 lb 2 oz (70.4 kg)   BMI 27.48 kg/m   Constitutional:  oriented to person, place, and time. No distress.  HENT:  Head: Grossly normal Eyes:  no discharge. No scleral icterus.  Neck: No JVD, no carotid bruits  Cardiovascular: Regular rate and rhythm, no murmurs appreciated Pulmonary/Chest: Clear to auscultation bilaterally, no wheezes or rails Abdominal: Soft.  no distension.  no tenderness.  Musculoskeletal: Normal range of motion Neurological:  normal muscle tone. Coordination normal. No atrophy Skin: Skin warm and dry Psychiatric: normal affect, pleasant   ASSESSMENT & PLAN:    Problem List Items Addressed This Visit       Cardiology Problems   Ascending aortic aneurysm (HCC) - Primary   Atrial fibrillation (HCC)   Orthostatic hypotension   Other Visit Diagnoses     Chest pain of uncertain etiology         Dilated ascending aorta Previously with stable aorta size 4.8 cm Repeat CT scan scheduled next week with follow-up with CT surgery No changes to medications Difficult to determine if she is bicuspid or tricuspid This was discussed with her, may need TEE if aorta dilation progression on CT. alternatively could have  a  gated cardiac CT to evaluate aortic valve  Chest pain None recently, atypical in nature No additional work-up at this time  Shortness of breath Not an active issue at this time Recommend continued exercise program Typically active at baseline  Orthostasis Previously declined Florinef, midodrine, prefers to manage this with high fluid and salt intake    Total encounter time more than 30 minutes  Greater than 50% was spent in counseling and coordination of care with the patient    Signed, Ida Rogue, [place] [address], [place], [place] [address]

## 2022-03-12 ENCOUNTER — Encounter: Payer: Self-pay | Admitting: Cardiovascular Disease

## 2022-03-12 ENCOUNTER — Ambulatory Visit: Payer: PPO | Attending: Cardiovascular Disease | Admitting: Cardiovascular Disease

## 2022-03-12 VITALS — BP 90/66 | HR 64 | Ht 63.0 in | Wt 155.1 lb

## 2022-03-12 DIAGNOSIS — I7121 Aneurysm of the ascending aorta, without rupture: Secondary | ICD-10-CM | POA: Diagnosis not present

## 2022-03-12 DIAGNOSIS — I480 Paroxysmal atrial fibrillation: Secondary | ICD-10-CM

## 2022-03-12 DIAGNOSIS — R079 Chest pain, unspecified: Secondary | ICD-10-CM

## 2022-03-12 DIAGNOSIS — I951 Orthostatic hypotension: Secondary | ICD-10-CM | POA: Diagnosis not present

## 2022-03-12 NOTE — Patient Instructions (Signed)
Medication Instructions:  No changes  If you need a refill on your cardiac medications before your next appointment, please call your pharmacy.   Lab work: No new labs needed  Testing/Procedures: No new testing needed  Follow-Up: At [facility], you and your health needs are our priority.  As part of our continuing mission to provide you with exceptional heart care, we have created designated Provider Care Teams.  These Care Teams include your primary Cardiologist (physician) and Advanced Practice Providers (APPs -  Physician Assistants and Nurse Practitioners) who all work together to provide you with the care you need, when you need it.  You will need a follow up appointment in 12 months  Providers on your designated Care Team:   Christopher Berge, NP Ryan Dunn, PA-C Cadence Furth, PA-C  COVID-19 Vaccine Information can be found at: [email or web address] For questions related to vaccine distribution or appointments, please email [email or web address] or call [phone].   

## 2022-03-18 NOTE — Addendum Note (Signed)
Addended by: Anselm Pancoast on: 03/18/2022 04:35 PM   Modules accepted: Orders

## 2022-03-24 ENCOUNTER — Ambulatory Visit
Admission: RE | Admit: 2022-03-24 | Discharge: 2022-03-24 | Disposition: A | Discharge status: Home / Self Care | Payer: PPO | Source: Ambulatory Visit | Attending: Surgery | Admitting: Surgery

## 2022-03-24 ENCOUNTER — Ambulatory Visit: Payer: PPO | Admitting: Surgery

## 2022-03-24 ENCOUNTER — Encounter: Payer: Self-pay | Admitting: Surgery

## 2022-03-24 VITALS — BP 128/85 | HR 70 | Resp 18 | Ht 63.0 in | Wt 150.0 lb

## 2022-03-24 DIAGNOSIS — I358 Other nonrheumatic aortic valve disorders: Secondary | ICD-10-CM | POA: Diagnosis not present

## 2022-03-24 DIAGNOSIS — K7689 Other specified diseases of liver: Secondary | ICD-10-CM | POA: Diagnosis not present

## 2022-03-24 DIAGNOSIS — I7121 Aneurysm of the ascending aorta, without rupture: Secondary | ICD-10-CM

## 2022-03-24 DIAGNOSIS — I712 Thoracic aortic aneurysm, without rupture, unspecified: Secondary | ICD-10-CM | POA: Diagnosis not present

## 2022-03-24 DIAGNOSIS — I517 Cardiomegaly: Secondary | ICD-10-CM | POA: Diagnosis not present

## 2022-03-24 MED ORDER — IOPAMIDOL (ISOVUE-370) INJECTION 76%
75.0000 mL | Freq: Once | INTRAVENOUS | Status: AC | PRN
  Administered 2022-03-24: 75 mL via INTRAVENOUS

## 2022-03-24 NOTE — Progress Notes (Signed)
HPI:  The patient is a 66 year old woman who returns today for follow-up of a 4.6 to 4.8 cm fusiform ascending aortic aneurysm with a trileaflet aortic valve.  She denies any chest pain or shortness of breath.  Her last 2D echocardiogram in January 2018 showed no aortic stenosis and mild aortic regurgitation.  Current Outpatient Medications  Medication Sig Dispense Refill   aspirin 81 MG tablet Take 81 mg by mouth daily.     azelastine (ASTELIN) 0.1 % nasal spray SMARTSIG:1-2 Spray(s) Both Nares Every 12 Hours PRN     b complex vitamins tablet Take 2 tablets by mouth daily.     BLACK ELDERBERRY,BERRY-FLOWER, PO Take 2 capsules by mouth daily.      Calcium Carb-Cholecalciferol (CALCIUM 500 + D3 PO) Take 2 Doses by mouth daily.      CRANBERRY SOFT PO Take 2 each by mouth daily.      Digestive Enzymes (DIGESTIVE ENZYME PO) Take 1-2 capsules by mouth daily.     levothyroxine (SYNTHROID) 75 MCG tablet TAKE 1 TABLET(75 MCG) BY MOUTH DAILY 90 tablet 1   PRESCRIPTION MEDICATION Place 1 tablet vaginally 3 (three) times a week. E3 '1mg'$  and Testosterone '1mg'$  compound     sodium chloride (MURO 128) 5 % ophthalmic solution Place 1 drop into both eyes daily.     Zinc 50 MG CAPS Take 50 mg by mouth daily.     No current facility-administered medications for this visit.   Facility-Administered Medications Ordered in Other Visits  Medication Dose Route Frequency Provider Last Rate Last Admin   0.9 %  sodium chloride infusion   Intravenous Once Sindy Guadel[place], MD         Physical Exam: BP 128/85 (BP Location: Right Arm, Patient Position: Sitting)   Pulse 70   Resp 18   Ht '5\' 3"'$  (1.6 m)   Wt 150 lb (68 kg)   SpO2 97% Comment: RA  BMI 26.57 kg/m  She looks well. Cardiac exam shows regular rate and rhythm with normal heart sounds.  There is no murmur. Lungs are clear. There is no peripheral edema.  Diagnostic Tests:  Narrative & Impression  CLINICAL DATA:  Follow up ascending thoracic  aortic aneurysm. History of squamous cell carcinoma of the oropharynx.   EXAM: CT ANGIOGRAPHY CHEST WITH CONTRAST   TECHNIQUE: Multidetector CT imaging of the chest was performed using the standard protocol during bolus administration of intravenous contrast. Multiplanar CT image reconstructions and MIPs were obtained to evaluate the vascular anatomy.   RADIATION DOSE REDUCTION: This exam was performed according to the departmental dose-optimization program which includes automated exposure control, adjustment of the mA and/or kV according to patient size and/or use of iterative reconstruction technique.   CONTRAST:  66m ISOVUE-370 IOPAMIDOL (ISOVUE-370) INJECTION 76%   COMPARISON:  03/11/2021   FINDINGS: Cardiovascular: Ascending thoracic aortic aneurysm stable at 4.6 cm diameter on image 77 series 5. No aortic dissection or acute aortic abnormality.   Mild aortic valve calcification noted. Dual lead pacer observed. Borderline cardiomegaly. Although today's exam was not optimized to assess for pulmonary embolus, no filling defect is identified in the pulmonary arterial tree to indicate the presence of pulmonary embolus.   Mediastinum/Nodes: Unremarkable   Lungs/Pleura: Unremarkable   Upper Abdomen: Stable left hepatic lobe cyst. This does not require independent surveillance.   Musculoskeletal: Unremarkable   Review of the MIP images confirms the above findings.   IMPRESSION: 1. Stable 4.6 cm ascending thoracic aortic aneurysm. No  change over the past 5 years. Current guidelines call for semi annual imaging follow up by CTA or MRA, but in the context of lack of change over the last 5 years, a more infrequent surveillance (such as yearly) would be clinically reasonable. Aortic aneurysm NOS (ICD10-I71.9) 2. Mild aortic valve calcification. 3. Borderline cardiomegaly. 4.     Electronically Signed   By: Van Clines M.D.   On: 03/24/2022 10:06       Impression:  This 66 year old woman has a stable 4.6 cm fusiform ascending aortic aneurysm with a trileaflet aortic valve.  This has been unchanged over the past 5 years.  It is still well below the surgical threshold of 5.5 cm.  I reviewed the CT images with her and answered her questions.  I stressed the importance of continued good blood pressure control in preventing further enlargement and acute aortic dissection.  I advised her against doing any heavy lifting that may require a Valsalva maneuver and could suddenly raise her blood pressure to high levels.  Plan:  She will return to see me in 1 year with a CTA of the chest.  I spent 20 minutes performing this established patient evaluation and > 50% of this time was spent face to face counseling and coordinating the care of this patient's aortic aneurysm.    Gaye Pollack, MD [facility] [phone]

## 2022-04-02 NOTE — Progress Notes (Signed)
Remote pacemaker transmission.   

## 2022-05-04 ENCOUNTER — Encounter: Payer: Self-pay | Admitting: Family Medicine

## 2022-05-04 ENCOUNTER — Ambulatory Visit (INDEPENDENT_AMBULATORY_CARE_PROVIDER_SITE_OTHER): Payer: PPO | Admitting: Family Medicine

## 2022-05-04 VITALS — BP 108/74 | HR 65 | Temp 98.2°F | Wt 155.0 lb

## 2022-05-04 DIAGNOSIS — Z0000 Encounter for general adult medical examination without abnormal findings: Secondary | ICD-10-CM

## 2022-05-04 DIAGNOSIS — Z136 Encounter for screening for cardiovascular disorders: Secondary | ICD-10-CM

## 2022-05-04 DIAGNOSIS — I700 Atherosclerosis of aorta: Secondary | ICD-10-CM | POA: Diagnosis not present

## 2022-05-04 DIAGNOSIS — E538 Deficiency of other specified B group vitamins: Secondary | ICD-10-CM

## 2022-05-04 DIAGNOSIS — E559 Vitamin D deficiency, unspecified: Secondary | ICD-10-CM | POA: Diagnosis not present

## 2022-05-04 DIAGNOSIS — I480 Paroxysmal atrial fibrillation: Secondary | ICD-10-CM | POA: Diagnosis not present

## 2022-05-04 DIAGNOSIS — E039 Hypothyroidism, unspecified: Secondary | ICD-10-CM

## 2022-05-04 DIAGNOSIS — Z1231 Encounter for screening mammogram for malignant neoplasm of breast: Secondary | ICD-10-CM | POA: Diagnosis not present

## 2022-05-04 LAB — URINALYSIS, ROUTINE W REFLEX MICROSCOPIC
Bilirubin, UA: NEGATIVE
Glucose, UA: NEGATIVE
Ketones, UA: NEGATIVE
Leukocytes,UA: NEGATIVE
Nitrite, UA: NEGATIVE
Protein,UA: NEGATIVE
RBC, UA: NEGATIVE
Specific Gravity, UA: 1.01 (ref 1.005–1.030)
Urobilinogen, Ur: 0.2 mg/dL (ref 0.2–1.0)
pH, UA: 7 (ref 5.0–7.5)

## 2022-05-04 NOTE — Assessment & Plan Note (Signed)
Will keep BP and cholesterol under good control. Continue to monitor.  

## 2022-05-04 NOTE — Progress Notes (Signed)
BP 108/74   Pulse 65   Temp 98.2 F (36.8 C)   Wt 155 lb (70.3 kg)   SpO2 98%   BMI 27.46 kg/m    Subjective:    Patient ID: Wendy Kelly, female    DOB: February 19, 1956, 66 y.o.   MRN: [id number]  HPI: Wendy Kelly is a 66 y.o. female presenting on 05/04/2022 for comprehensive medical examination. Current medical complaints include:  Caregiving for her Mom. Having a lot of stress. Doing OK.  HYPOTHYROIDISM Thyroid control status: unsure Satisfied with current treatment? yes Medication side effects: no Medication compliance: excellent compliance Recent dose adjustment:no Fatigue: yes Cold intolerance: no Heat intolerance: no Weight gain: no Weight loss: no Constipation: no Diarrhea/loose stools: no Palpitations: no Lower extremity edema: no Anxiety/depressed mood: yes  Menopausal Symptoms: no  Depression Screen done today and results listed below:     05/04/2022    8:09 AM 09/28/2021   11:02 AM 05/20/2021    1:34 PM 05/01/2021    1:18 PM 04/29/2020    9:16 AM  Depression screen PHQ 2/9  Decreased Interest 0 0 0 0 0  Down, Depressed, Hopeless 0 0 0 0 0  PHQ - 2 Score 0 0 0 0 0  Altered sleeping 0 0  3 3  Tired, decreased energy 3 0  2 3  Change in appetite 1 0  0 0  Feeling bad or failure about yourself  0 0  0 0  Trouble concentrating 0 0  0 0  Moving slowly or fidgety/restless 0 0  0 0  Suicidal thoughts 0 0  0 0  PHQ-9 Score 4 0  5 6  Difficult doing work/chores Not difficult at all Not difficult at all   Not difficult at all    Past Medical History:  Past Medical History:  Diagnosis Date   AA (aortic aneurysm) ([place])    a. 04/2011 Echo: Ao Root: 4.1cm, Asc Ao 4.7cm.   Anxiety    AV block, 2nd degree    a. 05/2005 - s/p MDT Adapta ADDR01 Dual Chamber PPM   Bicuspid aortic valve    Cancer ([place])    Throat   Chest pain    a. 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   COVID-19 virus infection 09/11/2019   Deep vein thrombosis (DVT) of left lower extremity ([place])  10/03/2017   Dehydration 08/11/2017   Depression    Expressive aphasia    a. ongoing since 04/2011 - seen by neurology - ? TIA vs. Migraine   Facial numbness    a. ongoing since 04/2011 - seen by neurology - ? TIA vs. 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 04/2011 Echo: EF 55-65%, No RWMA, Gr 1 DD.   Thyroid disease     Surgical History:  Past Surgical History:  Procedure Laterality Date   ABDOMINAL WALL MESH  REMOVAL     bladder tack     cataract Bilateral 2004   CHOLECYSTECTOMY  2000   COLONOSCOPY WITH PROPOFOL N/A 06/26/2019   Procedure: COLONOSCOPY WITH PROPOFOL;  Surgeon: Lucilla Lame, MD;  Location: [facility] END[facility];  Service: Endoscopy;  Laterality: N/A;   DILATION AND CURETTAGE OF UTERUS     DIRECT LARYNGOSCOPY Right 06/16/2017   Procedure: MICRO DIRECT LARYNGOSCOPY WITH BIOPSY OF RIGHT BASE OF TONGUE;  Surgeon: Carloyn Manner, MD;  Location: [facility] OR[facility];  Service: ENT;  Laterality: Right;   EP IMPLANTABLE DEVICE N/A 06/04/2015   Procedure: PPM Generator Changeout;  Surgeon: Deboraha Sprang, MD;  Location: [place] [facility];  Service: Cardiovascular;  Laterality: N/A;   ESOPHAGOGASTRODUODENOSCOPY (EGD) WITH PROPOFOL N/A 12/18/2015   Procedure: ESOPHAGOGASTRODUODENOSCOPY (EGD) WITH gastric biopsy and dilation;  Surgeon: Lucilla Lame, MD;  Location: [facility];  Service: Endoscopy;  Laterality: N/A;   EYE SURGERY     INSERT / REPLACE / REMOVE PACEMAKER     IR FLUORO GUIDE PORT INSERTION RIGHT  06/22/2017   IR REMOVAL TUN ACCESS W/ PORT W/O FL MOD SED  04/14/2018   MOHS SURGERY  02/2018   PACEMAKER INSERTION  2006   Medtronic Adapta ADDR01    TONSILLECTOMY     TONSILLECTOMY     VAGINAL HYSTERECTOMY  2013   mad/cope    Medications:  Current Outpatient Medications on File Prior to Visit  Medication Sig   aspirin 81 MG tablet Take 81 mg by mouth daily.   azelastine (ASTELIN) 0.1 % nasal spray SMARTSIG:1-2 Spray(s) Both Nares Every 12 Hours PRN   b complex vitamins tablet Take 2 tablets by mouth daily.   BLACK ELDERBERRY,BERRY-FLOWER, PO Take 2 capsules by mouth daily.    Calcium Carb-Cholecalciferol (CALCIUM 500 + D3 PO) Take 2 Doses by mouth daily.    CRANBERRY SOFT PO Take 2 each by mouth daily.    Digestive Enzymes (DIGESTIVE ENZYME PO) Take 1-2 capsules by mouth daily.   levothyroxine (SYNTHROID) 75 MCG tablet TAKE 1 TABLET(75 MCG) BY MOUTH DAILY   PRESCRIPTION MEDICATION Place 1 tablet vaginally 3 (three) times a week. E3 '1mg'$  and Testosterone '1mg'$  compound   sodium chloride (MURO 128) 5 % ophthalmic solution Place 1 drop into both eyes daily.   Zinc 50 MG CAPS Take 50 mg by mouth daily.   Current Facility-Administered Medications on File Prior to Visit  Medication   0.9 %  sodium chloride infusion    Allergies:  Allergies  Allergen Reactions   Cefdinir Swelling    Swelling in mouth    Cephalosporins Swelling   Dairy Aid [Tilactase] Swelling    Milk and milk products cause swelling and tingling of tongue   Flagyl [Metronidazole] Swelling    Mouth and throat and ears go red and burn   Lactose Intolerance (Gi) Other (See Comments)    GI upset.  Eats only gluten free   Penicillins Rash    Rash/hives   Phenylephrine-Guaifenesin Anaphylaxis    Night terrors   Sulfa Antibiotics Rash and Hives    Other reaction(s): Unknown    Sulfonamide Derivatives Rash    Rash/itching.  From head to her toes, the itching was horrible. Penicillin is the worst   Tape Rash    Adhesives all cause a problem severely.  It doesn't matter if it is paper tape or not.   Dexilant [Dexlansoprazole]     Pressure in head like head is in a  vice and being squeezed   Entex Lq [Phenylephrine-Guaifenesin]     Night terrors   Other Other (See Comments)    Flu vaccine:  Weakness, dizziness, tia type symptoms.     Oxycodone Nausea And Vomiting  And itching   Succinylcholine Other (See Comments)    Trouble waking up   Vancomycin Rash   Clarithromycin Itching and Other (See Comments)   Haemophilus Influenzae Vaccines Other (See Comments)    Stroke like symptoms   Tetracycline Other (See Comments)    Unkown   Codeine Nausea And Vomiting   Guaifenesin & Derivatives Other (See Comments)    Night terrors   Lactobacillus Itching   Naproxen Sodium     Itching/rash   Pseudoephedrine Rash   Tetracyclines & Related Other (See Comments)    Was taking flagyl at the same time; unsure which caused the swelling  Swelling of throat. Pharmacist states that he believes it was the flagyl and not the tetracycline   Wheat Bran Other (See Comments)    Upset stomach. Now eats gluten free    Social History:  Social History   Socioeconomic History   Marital status: Married    Spouse name: Not on file   Number of children: Not on file   Years of education: Not on file   Highest education level: Not on file  Occupational History   Not on file  Tobacco Use   Smoking status: Never   Smokeless tobacco: Never   Tobacco comments:    tobacco use -no  Vaping Use   Vaping Use: Never used  Substance and Sexual Activity   Alcohol use: No   Drug use: No   Sexual activity: Yes    Birth control/protection: Surgical  Other Topics Concern   Not on file  Social History Narrative   Lives in [place] with husband.  Works out regularly.  Works as a Geophysicist/field seismologist - owns own business.    Social Determinants of Health   Financial Resource Strain: Low Risk  (05/20/2021)   Overall Financial Resource Strain (CARDIA)    Difficulty of Paying Living Expenses: Not hard at all  Food Insecurity: No Food Insecurity (05/20/2021)   Hunger Vital  Sign    Worried About Running Out of Food in the Last Year: Never true    Ran Out of Food in the Last Year: Never true  Transportation Needs: No Transportation Needs (05/20/2021)   PRAPARE - Hydrologist (Medical): No    Lack of Transportation (Non-Medical): No  Physical Activity: Sufficiently Active (05/20/2021)   Exercise Vital Sign    Days of Exercise per Week: 4 days    Minutes of Exercise per Session: 40 min  Stress: No Stress Concern Present (05/20/2021)   [place]    Feeling of Stress : Not at all  Social Connections: [place] (05/20/2021)   Social Connection and Isolation Panel [NHANES]    Frequency of Communication with Friends and Family: More than three times a week    Frequency of Social Gatherings with Friends and Family: More than three times a week    Attends Religious Services: More than 4 times per year    Active Member of [employer] or Organizations: Yes    Attends Archivist Meetings: More than 4 times per year    Marital Status: Married  Human resources officer Violence: Not At Risk (05/20/2021)   Humiliation, Afraid, Rape, and Kick questionnaire    Fear of Current or Ex-Partner: No    Emotionally Abused: No    Physically Abused: No    Sexually Abused: No   Social History   Tobacco Use  Smoking Status Never  Smokeless Tobacco Never  Tobacco Comments   tobacco use -no   Social History   Substance and Sexual Activity  Alcohol Use No    Family History:  Family History  Problem Relation Age of Onset   COPD Mother        alive @ 25   Atrial fibrillation Mother    Stroke Father        died @ 99   Breast cancer Maternal Aunt 15   COPD Maternal Grandmother    Ovarian cancer Neg Hx    Diabetes Neg Hx     Past medical history, surgical history, medications, allergies, family history and social history reviewed with patient today and changes made to  appropriate areas of the chart.   Review of Systems  Constitutional: Negative.   HENT:  Positive for hearing loss. Negative for congestion, ear discharge, ear pain, nosebleeds, sinus pain, sore throat and tinnitus.   Eyes: Negative.   Respiratory: Negative.  Negative for stridor.   Cardiovascular: Negative.   Gastrointestinal: Negative.   Genitourinary: Negative.   Musculoskeletal: Negative.   Skin: Negative.   Neurological: Negative.   Endo/Heme/Allergies: Negative.   Psychiatric/Behavioral:  Negative for depression, hallucinations, memory loss, substance abuse and suicidal ideas. The patient has insomnia. The patient is not nervous/anxious.    All other ROS negative except what is listed above and in the HPI.      Objective:    BP 108/74   Pulse 65   Temp 98.2 F (36.8 C)   Wt 155 lb (70.3 kg)   SpO2 98%   BMI 27.46 kg/m   Wt Readings from Last 3 Encounters:  05/04/22 155 lb (70.3 kg)  03/24/22 150 lb (68 kg)  03/12/22 155 lb 2 oz (70.4 kg)    Physical Exam Vitals and nursing note reviewed.  Constitutional:      General: She is not in acute distress.    Appearance: Normal appearance. She is not ill-appearing, toxic-appearing or diaphoretic.  HENT:     Head: Normocephalic and atraumatic.     Right Ear: Tympanic membrane, ear canal and external ear normal. There is no impacted cerumen.     Left Ear: Tympanic membrane, ear canal and external ear normal. There is no impacted cerumen.     Nose: Nose normal. No congestion or rhinorrhea.     Mouth/Throat:     Mouth: Mucous membranes are moist.     Pharynx: Oropharynx is clear. No oropharyngeal exudate or posterior oropharyngeal erythema.  Eyes:     General: No scleral icterus.       Right eye: No discharge.        Left eye: No discharge.     Extraocular Movements: Extraocular movements intact.     Conjunctiva/sclera: Conjunctivae normal.     Pupils: Pupils are equal, round, and reactive to light.  Neck:     Vascular:  No carotid bruit.  Cardiovascular:     Rate and Rhythm: Normal rate and regular rhythm.     Pulses: Normal pulses.     Heart sounds: No murmur heard.    No friction rub. No gallop.  Pulmonary:     Effort: Pulmonary effort is normal. No respiratory distress.     Breath sounds: Normal breath sounds. No stridor. No wheezing, rhonchi or rales.  Chest:     Chest wall: No tenderness.  Abdominal:     General: Abdomen is flat. Bowel sounds are normal. There is no distension.  Palpations: Abdomen is soft. There is no mass.     Tenderness: There is no abdominal tenderness. There is no right CVA tenderness, left CVA tenderness, guarding or rebound.     Hernia: No hernia is present.  Genitourinary:    Comments: Breast and pelvic exams deferred with shared decision making Musculoskeletal:        General: No swelling, tenderness, deformity or signs of injury.     Cervical back: Normal range of motion and neck supple. No rigidity. No muscular tenderness.     Right lower leg: No edema.     Left lower leg: No edema.  Lymphadenopathy:     Cervical: No cervical adenopathy.  Skin:    General: Skin is warm and dry.     Capillary Refill: Capillary refill takes less than 2 seconds.     Coloration: Skin is not jaundiced or pale.     Findings: No bruising, erythema, lesion or rash.  Neurological:     General: No focal deficit present.     Mental Status: She is alert and oriented to person, place, and time. Mental status is at baseline.     Cranial Nerves: No cranial nerve deficit.     Sensory: No sensory deficit.     Motor: No weakness.     Coordination: Coordination normal.     Gait: Gait normal.     Deep Tendon Reflexes: Reflexes normal.  Psychiatric:        Mood and Affect: Mood normal.        Behavior: Behavior normal.        Thought Content: Thought content normal.        Judgment: Judgment normal.     Results for orders placed or performed in visit on 03/09/22  CUP PACEART REMOTE  DEVICE CHECK  Result Value Ref Range   Date Time Interrogation Session [id number]    Pulse Generator Manufacturer MERM    Pulse Gen Model [id number] Adapta    Pulse Gen Serial Number [id number]    Clinic Name [facility] Pulse Generator Type Implantable Pulse Generator    Implantable Pulse Generator Implant Date 16606301    Implantable Lead Manufacturer MERM    Implantable Lead Model 4092 CapSure SP Novus    Implantable Lead Serial Number [id number]    Implantable Lead Implant Date 60109323    Implantable Lead Location [id number]    Implantable Lead Manufacturer MERM    Implantable Lead Model 4592 CapSure SP Novus    Implantable Lead Serial Number [id number]    Implantable Lead Implant Date [id number]    Implantable Lead Location [id number]    Lead Channel Setting Sensing Sensitivity 4.00 mV   Lead Channel Setting Pacing Amplitude 2.000 V   Lead Channel Setting Pacing Pulse Width 0.40 ms   Lead Channel Setting Pacing Amplitude 2.500 V   Lead Channel Impedance Value 644 ohm   Lead Channel Pacing Threshold Amplitude 0.500 V   Lead Channel Pacing Threshold Pulse Width 0.40 ms   Lead Channel Impedance Value 727 ohm   Lead Channel Pacing Threshold Amplitude 1.250 V   Lead Channel Pacing Threshold Pulse Width 0.40 ms   Battery Status OK    Battery Remaining Longevity 113 mo   Battery Voltage 2.78 V   Battery Impedance 360 ohm   Brady Statistic AP VP Percent 0 %   Brady Statistic AS VP Percent 0 %   Brady Statistic AP VS Percent 17 %   [employer] AS  VS Percent 83 %      Assessment & Plan:   Problem List Items Addressed This Visit       Cardiovascular and Mediastinum   Atrial fibrillation (HCC)    NSR today. Will check labs. Continue to monitor.       Relevant Orders   CBC with Differential/Platelet   Comprehensive metabolic panel   Urinalysis, Routine w reflex microscopic   Aortic atherosclerosis (HCC)    Will keep BP and cholesterol under good control.  Continue to monitor.        Endocrine   Hypothyroidism    Rechecking labs today. Await results. Treat as needed.       Relevant Orders   CBC with Differential/Platelet   Comprehensive metabolic panel   TSH   Other Visit Diagnoses     Routine general medical examination at a health care facility    -  Primary   Vaccines up to date/declined. Screening labs checked today. Pap N/A. Mammo ordered. Colonoscopy up to date. Continue diet and exercise. Call with any concerns.    Screening for cardiovascular condition       Labs drawn today. Await results.    Relevant Orders   Lipid Panel w/o Chol/HDL Ratio   Vitamin B12 deficiency       Labs drawn today. Await results.    Relevant Orders   B12   Vitamin D deficiency       Labs drawn today. Await results.    Relevant Orders   VITAMIN D 25 Hydroxy (Vit-D Deficiency, Fractures)   Encounter for screening mammogram for malignant neoplasm of breast       Relevant Orders   MM 3D SCREEN BREAST BILATERAL        Follow up plan: Return in about 1 year (around 05/05/2023) for physical.   LABORATORY TESTING:  - Pap smear: not applicable  IMMUNIZATIONS:   - Tdap: Tetanus vaccination status reviewed: last tetanus booster within 10 years. - Influenza: Refused - Pneumovax: Refused - Prevnar: Refused - COVID: Refused - HPV: Not applicable - Shingrix vaccine: Refused  SCREENING: -Mammogram: Ordered today  - Colonoscopy: Up to date  - Bone Density: Up to date   PATIENT COUNSELING:   Advised to take 1 mg of folate supplement per day if capable of pregnancy.   Sexuality: Discussed sexually transmitted diseases, partner selection, use of condoms, avoidance of unintended pregnancy  and contraceptive alternatives.   Advised to avoid cigarette smoking.  I discussed with the patient that most people either abstain from alcohol or drink within safe limits (<=14/week and <=4 drinks/occasion for males, <=7/weeks and <= 3 drinks/occasion for  females) and that the risk for alcohol disorders and other health effects rises proportionally with the number of drinks per week and how often a drinker exceeds daily limits.  Discussed cessation/primary prevention of drug use and availability of treatment for abuse.   Diet: Encouraged to adjust caloric intake to maintain  or achieve ideal body weight, to reduce intake of dietary saturated fat and total fat, to limit sodium intake by avoiding high sodium foods and not adding table salt, and to maintain adequate dietary potassium and calcium preferably from fresh fruits, vegetables, and low-fat dairy products.    stressed the importance of regular exercise  Injury prevention: Discussed safety belts, safety helmets, smoke detector, smoking near bedding or upholstery.   Dental health: Discussed importance of regular tooth brushing, flossing, and dental visits.    NEXT PREVENTATIVE PHYSICAL DUE IN 1  YEAR. Return in about 1 year (around 05/05/2023) for physical.

## 2022-05-04 NOTE — Assessment & Plan Note (Signed)
Rechecking labs today. Await results. Treat as needed.  °

## 2022-05-04 NOTE — Assessment & Plan Note (Signed)
NSR today. Will check labs. Continue to monitor.

## 2022-05-05 LAB — CBC WITH DIFFERENTIAL/PLATELET
Basophils Absolute: 0 10*3/uL (ref 0.0–0.2)
Basos: 1 %
EOS (ABSOLUTE): 0.1 10*3/uL (ref 0.0–0.4)
Eos: 2 %
Hematocrit: 42.4 % (ref 34.0–46.6)
Hemoglobin: 13.9 g/dL (ref 11.1–15.9)
Immature Grans (Abs): 0 10*3/uL (ref 0.0–0.1)
Immature Granulocytes: 0 %
Lymphocytes Absolute: 0.8 10*3/uL (ref 0.7–3.1)
Lymphs: 15 %
MCH: 31 pg (ref 26.6–33.0)
MCHC: 32.8 g/dL (ref 31.5–35.7)
MCV: 94 fL (ref 79–97)
Monocytes Absolute: 0.4 10*3/uL (ref 0.1–0.9)
Monocytes: 6 %
Neutrophils Absolute: 4.2 10*3/uL (ref 1.4–7.0)
Neutrophils: 76 %
Platelets: 145 10*3/uL — ABNORMAL LOW (ref 150–450)
RBC: 4.49 x10E6/uL (ref 3.77–5.28)
RDW: 12.6 % (ref 11.7–15.4)
WBC: 5.5 10*3/uL (ref 3.4–10.8)

## 2022-05-05 LAB — LIPID PANEL W/O CHOL/HDL RATIO
Cholesterol, Total: 176 mg/dL (ref 100–199)
HDL: 80 mg/dL (ref >39)
LDL Chol Calc (NIH): 87 mg/dL (ref 0–99)
Triglycerides: 43 mg/dL (ref 0–149)
VLDL Cholesterol Cal: 9 mg/dL (ref 5–40)

## 2022-05-05 LAB — COMPREHENSIVE METABOLIC PANEL
ALT: 19 IU/L (ref 0–32)
AST: 20 IU/L (ref 0–40)
Albumin/Globulin Ratio: 2.4 — ABNORMAL HIGH (ref 1.2–2.2)
Albumin: 4.4 g/dL (ref 3.9–4.9)
Alkaline Phosphatase: 74 IU/L (ref 44–121)
BUN/Creatinine Ratio: 18 (ref 12–28)
BUN: 14 mg/dL (ref 8–27)
Bilirubin Total: 0.7 mg/dL (ref 0.0–1.2)
CO2: 24 mmol/L (ref 20–29)
Calcium: 9.3 mg/dL (ref 8.7–10.3)
Chloride: 107 mmol/L — ABNORMAL HIGH (ref 96–106)
Creatinine, Ser: 0.79 mg/dL (ref 0.57–1.00)
Globulin, Total: 1.8 g/dL (ref 1.5–4.5)
Glucose: 91 mg/dL (ref 70–99)
Potassium: 4 mmol/L (ref 3.5–5.2)
Sodium: 144 mmol/L (ref 134–144)
Total Protein: 6.2 g/dL (ref 6.0–8.5)
eGFR: 82 mL/min/{1.73_m2} (ref >59)

## 2022-05-05 LAB — TSH: TSH: 3.91 u[IU]/mL (ref 0.450–4.500)

## 2022-05-05 LAB — VITAMIN D 25 HYDROXY (VIT D DEFICIENCY, FRACTURES): Vit D, 25-Hydroxy: 59 ng/mL (ref 30.0–100.0)

## 2022-05-05 LAB — VITAMIN B12: Vitamin B-12: 512 pg/mL (ref 232–1245)

## 2022-05-18 DIAGNOSIS — L309 Dermatitis, unspecified: Secondary | ICD-10-CM | POA: Diagnosis not present

## 2022-05-24 ENCOUNTER — Other Ambulatory Visit: Payer: Self-pay | Admitting: Family Medicine

## 2022-05-24 NOTE — Telephone Encounter (Unsigned)
Medication Refill - Medication: levothyroxine (SYNTHROID) 75 MCG tablet   Has the patient contacted their pharmacy? No. Pt wants to change pharmacies  Preferred Pharmacy (with phone number or street name): [place], [place] - [place]  Has the patient been seen for an appointment in the last year OR does the patient have an upcoming appointment? Yes.    Agent: Please be advised that RX refills may take up to 3 business days. We ask that you follow-up with your pharmacy.

## 2022-05-25 ENCOUNTER — Other Ambulatory Visit: Payer: Self-pay

## 2022-05-25 DIAGNOSIS — Z8589 Personal history of malignant neoplasm of other organs and systems: Secondary | ICD-10-CM

## 2022-05-25 MED ORDER — LEVOTHYROXINE SODIUM 75 MCG PO TABS: ORAL_TABLET | ORAL | 1 refills | Status: DC

## 2022-05-25 NOTE — Telephone Encounter (Signed)
Requested Prescriptions  Pending Prescriptions Disp Refills   levothyroxine (SYNTHROID) 75 MCG tablet 90 tablet 1    Sig: TAKE 1 TABLET(75 MCG) BY MOUTH DAILY     Endocrinology:  Hypothyroid Agents Passed - 05/25/2022  4:27 PM      Passed - TSH in normal range and within 360 days    TSH  Date Value Ref Range Status  05/04/2022 3.910 0.450 - 4.500 uIU/mL Final         Passed - Valid encounter within last 12 months    Recent Outpatient Visits           3 weeks ago Routine general medical examination at a health care facility   [facility], Megan P, DO   7 months ago Breast lump on right side at 12 o'clock position   [place], [place], DO   1 year ago Encounter for [employer] annual wellness exam   [facility] Valerie Roys, DO   1 year ago Routine general medical examination at a health care facility   [place], [place], DO   2 years ago Welcome to [employer] preventive visit   [place], Barb Merino, DO       Future Appointments             In 11 months Wynetta Emery, Barb Merino, DO [employer], [place]

## 2022-05-26 ENCOUNTER — Inpatient Hospital Stay (HOSPITAL_BASED_OUTPATIENT_CLINIC_OR_DEPARTMENT_OTHER): Payer: PPO | Admitting: Oncology

## 2022-05-26 ENCOUNTER — Inpatient Hospital Stay: Payer: PPO | Attending: Oncology

## 2022-05-26 ENCOUNTER — Encounter: Payer: Self-pay | Admitting: Oncology

## 2022-05-26 VITALS — BP 113/70 | HR 71 | Resp 16 | Wt 153.1 lb

## 2022-05-26 DIAGNOSIS — Z803 Family history of malignant neoplasm of breast: Secondary | ICD-10-CM | POA: Insufficient documentation

## 2022-05-26 DIAGNOSIS — Z7901 Long term (current) use of anticoagulants: Secondary | ICD-10-CM | POA: Diagnosis not present

## 2022-05-26 DIAGNOSIS — K219 Gastro-esophageal reflux disease without esophagitis: Secondary | ICD-10-CM | POA: Diagnosis not present

## 2022-05-26 DIAGNOSIS — E039 Hypothyroidism, unspecified: Secondary | ICD-10-CM | POA: Diagnosis not present

## 2022-05-26 DIAGNOSIS — Z9221 Personal history of antineoplastic chemotherapy: Secondary | ICD-10-CM | POA: Diagnosis not present

## 2022-05-26 DIAGNOSIS — Z08 Encounter for follow-up examination after completed treatment for malignant neoplasm: Secondary | ICD-10-CM | POA: Diagnosis not present

## 2022-05-26 DIAGNOSIS — Z79890 Hormone replacement therapy (postmenopausal): Secondary | ICD-10-CM | POA: Insufficient documentation

## 2022-05-26 DIAGNOSIS — Z85819 Personal history of malignant neoplasm of unspecified site of lip, oral cavity, and pharynx: Secondary | ICD-10-CM | POA: Diagnosis not present

## 2022-05-26 DIAGNOSIS — Z8616 Personal history of COVID-19: Secondary | ICD-10-CM | POA: Insufficient documentation

## 2022-05-26 DIAGNOSIS — Z86718 Personal history of other venous thrombosis and embolism: Secondary | ICD-10-CM | POA: Insufficient documentation

## 2022-05-26 DIAGNOSIS — Z8589 Personal history of malignant neoplasm of other organs and systems: Secondary | ICD-10-CM

## 2022-05-26 DIAGNOSIS — R87820 Cervical low risk human papillomavirus (HPV) DNA test positive: Secondary | ICD-10-CM | POA: Insufficient documentation

## 2022-05-26 DIAGNOSIS — Z923 Personal history of irradiation: Secondary | ICD-10-CM | POA: Diagnosis not present

## 2022-05-26 DIAGNOSIS — Z7982 Long term (current) use of aspirin: Secondary | ICD-10-CM | POA: Insufficient documentation

## 2022-05-26 LAB — CBC WITH DIFFERENTIAL/PLATELET
Abs Immature Granulocytes: 0.01 10*3/uL (ref 0.00–0.07)
Basophils Absolute: 0.1 10*3/uL (ref 0.0–0.1)
Basophils Relative: 1 %
Eosinophils Absolute: 0.1 10*3/uL (ref 0.0–0.5)
Eosinophils Relative: 3 %
HCT: 40.5 % (ref 36.0–46.0)
Hemoglobin: 13.7 g/dL (ref 12.0–15.0)
Immature Granulocytes: 0 %
Lymphocytes Relative: 21 %
Lymphs Abs: 0.8 10*3/uL (ref 0.7–4.0)
MCH: 30.9 pg (ref 26.0–34.0)
MCHC: 33.8 g/dL (ref 30.0–36.0)
MCV: 91.2 fL (ref 80.0–100.0)
Monocytes Absolute: 0.4 10*3/uL (ref 0.1–1.0)
Monocytes Relative: 9 %
Neutro Abs: 2.6 10*3/uL (ref 1.7–7.7)
Neutrophils Relative %: 66 %
Platelets: 139 10*3/uL — ABNORMAL LOW (ref 150–400)
RBC: 4.44 MIL/uL (ref 3.87–5.11)
RDW: 12.5 % (ref 11.5–15.5)
WBC: 3.9 10*3/uL — ABNORMAL LOW (ref 4.0–10.5)
nRBC: 0 % (ref 0.0–0.2)

## 2022-05-26 LAB — COMPREHENSIVE METABOLIC PANEL
ALT: 22 U/L (ref 0–44)
AST: 23 U/L (ref 15–41)
Albumin: 3.9 g/dL (ref 3.5–5.0)
Alkaline Phosphatase: 58 U/L (ref 38–126)
Anion gap: 6 (ref 5–15)
BUN: 20 mg/dL (ref 8–23)
CO2: 28 mmol/L (ref 22–32)
Calcium: 9.5 mg/dL (ref 8.9–10.3)
Chloride: 104 mmol/L (ref 98–111)
Creatinine, Ser: 0.84 mg/dL (ref 0.44–1.00)
GFR, Estimated: >60 mL/min (ref >60)
Glucose, Bld: 102 mg/dL — ABNORMAL HIGH (ref 70–99)
Potassium: 4 mmol/L (ref 3.5–5.1)
Sodium: 138 mmol/L (ref 135–145)
Total Bilirubin: 1.1 mg/dL (ref 0.3–1.2)
Total Protein: 6.4 g/dL — ABNORMAL LOW (ref 6.5–8.1)

## 2022-05-26 NOTE — Progress Notes (Signed)
Pt states she has been dealing with pain and aches ehind her rt ear has been going on for about a week.

## 2022-05-26 NOTE — Progress Notes (Signed)
Hematology/Oncology Consult [facility]  Telephone:([phone] [phone]  Patient Care Team: Valerie Roys, DO as PCP - General (Family Medicine) Minna Merritts, MD as Consulting Physician (Cardiology) Sindy Guadeloupe, MD as Consulting Physician (Oncology) Carloyn Manner, MD as Referring Physician (Otolaryngology) Noreene Filbert, MD as Referring Physician (Radiation Oncology) Rebekah [place]field, LCSW as Social Worker (Licensed Clinical Social Worker)   Name of the patient: Wendy Kelly  [id number]  [birth year]   Date of visit: 05/26/22  Diagnosis- 1. 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  She underwent concurrent chemoradiation with weekly cisplatin which she completed in February 2019.  She also had left lower extremity DVT after she finished chemotherapy and took Xarelto for 3 months following which she stopped taking it.  She continues to be in remission since then.  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  Denies any specific complaints at this time.  Appetite and weight have remained stable.  Denies any sore throat or difficulty swallowing.  ECOG PS- 0 Pain scale- 0   Review of systems- Review of Systems  Constitutional:  Negative for chills, fever, malaise/fatigue and weight loss.  HENT:  Negative for congestion, ear discharge and nosebleeds.   Eyes:  Negative for blurred vision.  Respiratory:  Negative for cough, hemoptysis, sputum production, shortness of breath and wheezing.   Cardiovascular:  Negative for chest pain, palpitations, orthopnea  and claudication.  Gastrointestinal:  Negative for abdominal pain, blood in stool, constipation, diarrhea, heartburn, melena, nausea and vomiting.  Genitourinary:  Negative for dysuria, flank pain, frequency, hematuria and urgency.  Musculoskeletal:  Negative for back pain, joint pain and myalgias.  Skin:  Negative for rash.  Neurological:  Negative for dizziness, tingling, focal weakness, seizures, weakness and headaches.  Endo/Heme/Allergies:  Does not bruise/bleed easily.  Psychiatric/Behavioral:  Negative for depression and suicidal ideas. The patient does not have insomnia.       Allergies  Allergen Reactions   Cefdinir Swelling    Swelling in mouth    Cephalosporins Swelling   Dairy Aid [Tilactase] Swelling    Milk and milk products cause swelling and tingling of tongue   Flagyl [Metronidazole] Swelling    Mouth and throat and ears go red and burn   Lactose Intolerance (Gi) Other (See Comments)    GI upset.  Eats only gluten free   Penicillins Rash    Rash/hives   Phenylephrine-Guaifenesin Anaphylaxis    Night terrors   Sulfa Antibiotics Rash and Hives    Other reaction(s): Unknown    Sulfonamide Derivatives Rash    Rash/itching.  From head to her toes, the itching was horrible. Penicillin is the worst   Tape Rash    Adhesives all cause a problem severely.  It doesn't matter if it is paper tape or not.   Dexilant [Dexlansoprazole]     Pressure in head like head is in a vice and being squeezed   Entex Lq [Phenylephrine-Guaifenesin]     Night terrors   Other Other (See Comments)    Flu vaccine:  Weakness, dizziness, tia type symptoms.  Oxycodone Nausea And Vomiting    And itching   Succinylcholine Other (See Comments)    Trouble waking up   Vancomycin Rash   Clarithromycin Itching and Other (See Comments)   Haemophilus Influenzae Vaccines Other (See Comments)    Stroke like symptoms   Tetracycline Other (See Comments)    Unkown   Codeine Nausea And Vomiting    Guaifenesin & Derivatives Other (See Comments)    Night terrors   Lactobacillus Itching   Naproxen Sodium     Itching/rash   Pseudoephedrine Rash   Tetracyclines & Related Other (See Comments)    Was taking flagyl at the same time; unsure which caused the swelling  Swelling of throat. Pharmacist states that he believes it was the flagyl and not the tetracycline   Wheat Bran Other (See Comments)    Upset stomach. Now eats gluten free     Past Medical History:  Diagnosis Date   AA (aortic aneurysm) ([place])    a. 04/2011 Echo: Ao Root: 4.1cm, Asc Ao 4.7cm.   Anxiety    AV block, 2nd degree    a. 05/2005 - s/p MDT Adapta ADDR01 Dual Chamber PPM   Bicuspid aortic valve    Cancer ([place])    Throat   Chest pain    a. 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   COVID-19 virus infection 09/11/2019   Deep vein thrombosis (DVT) of left lower extremity ([place]) 10/03/2017   Dehydration 08/11/2017   Depression    Expressive aphasia    a. ongoing since 04/2011 - seen by neurology - ? TIA vs. Migraine   Facial numbness    a. ongoing since 04/2011 - seen by neurology - ? TIA vs. 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 04/2011 Echo: EF 55-65%, No RWMA, Gr 1 DD.   Thyroid disease      Past Surgical History:  Procedure Laterality Date   ABDOMINAL WALL MESH  REMOVAL     bladder tack     cataract Bilateral 2004   CHOLECYSTECTOMY  2000   COLONOSCOPY WITH PROPOFOL N/A 06/26/2019   Procedure: COLONOSCOPY WITH PROPOFOL;  Surgeon: Lucilla Lame, MD;  Location[facility]COPY;  Service: Endoscopy;  Laterality: N/A;   DILATION AND CURETTAGE OF UTERUS     DIRECT LARYNGOSCOPY Right 06/16/2017    Procedure: MICRO DIRECT LARYNGOSCOPY WITH BIOPSY OF RIGHT BASE OF TONGUE;  Surgeon: Carloyn Manner, MD;  Location: [facility] ORS;  Service: ENT;  Laterality: Right;   EP IMPLANTABLE DEVICE N/A 06/04/2015   Procedure: PPM Generator Changeout;  Surgeon: Deboraha Sprang, MD;  Location: [place] [facility];  Service: Cardiovascular;  Laterality: N/A;   ESOPHAGOGASTRODUODENOSCOPY (EGD) WITH PROPOFOL N/A 12/18/2015   Procedure: ESOPHAGOGASTRODUODENOSCOPY (EGD) WITH gastric biopsy and dilation;  Surgeon: Lucilla Lame, MD;  Location: [place];  Service: Endoscopy;  Laterality: N/A;   EYE SURGERY     INSERT / REPLACE / REMOVE PACEMAKER     IR FLUORO GUIDE PORT INSERTION RIGHT  06/22/2017   IR REMOVAL TUN ACCESS W/ PORT W/O FL MOD SED  04/14/2018  MOHS SURGERY  02/2018   PACEMAKER INSERTION  2006   [employer] Adapta ADDR01   TONSILLECTOMY     TONSILLECTOMY     VAGINAL HYSTERECTOMY  2013   mad/cope    Social History   Socioeconomic History   Marital status: Married    Spouse name: Not on file   Number of children: Not on file   Years of education: Not on file   Highest education level: Not on file  Occupational History   Not on file  Tobacco Use   Smoking status: Never   Smokeless tobacco: Never   Tobacco comments:    tobacco use -no  Vaping Use   Vaping Use: Never used  Substance and Sexual Activity   Alcohol use: No   Drug use: No   Sexual activity: Yes    Birth control/protection: Surgical  Other Topics Concern   Not on file  Social History Narrative   Lives in [place] with husband.  Works out regularly.  Works as a Geophysicist/field seismologist - owns own business.    Social Determinants of Health   Financial Resource Strain: Low Risk  (05/20/2021)   Overall Financial Resource Strain (CARDIA)    Difficulty of Paying Living Expenses: Not hard at all  Food Insecurity: No Food Insecurity (05/20/2021)   Hunger Vital Sign    Worried About Running Out of Food in the Last Year:  Never true    Ran Out of Food in the Last Year: Never true  Transportation Needs: No Transportation Needs (05/20/2021)   PRAPARE - Hydrologist (Medical): No    Lack of Transportation (Non-Medical): No  Physical Activity: Sufficiently Active (05/20/2021)   Exercise Vital Sign    Days of Exercise per Week: 4 days    Minutes of Exercise per Session: 40 min  Stress: No Stress Concern Present (05/20/2021)   [place]    Feeling of Stress : Not at all  Social Connections: [place] (05/20/2021)   Social Connection and Isolation Panel [NHANES]    Frequency of Communication with Friends and Family: More than three times a week    Frequency of Social Gatherings with Friends and Family: More than three times a week    Attends Religious Services: More than 4 times per year    Active Member of [employer] or Organizations: Yes    Attends Music therapist: More than 4 times per year    Marital Status: Married  Human resources officer Violence: Not At Risk (05/20/2021)   Humiliation, Afraid, Rape, and Kick questionnaire    Fear of Current or Ex-Partner: No    Emotionally Abused: No    Physically Abused: No    Sexually Abused: No    Family History  Problem Relation Age of Onset   COPD Mother        alive @ 64   Atrial fibrillation Mother    Stroke Father        died @ 73   Breast cancer Maternal Aunt 70   COPD Maternal Grandmother    Ovarian cancer Neg Hx    Diabetes Neg Hx      Current Outpatient Medications:    aspirin 81 MG tablet, Take 81 mg by mouth daily., Disp: , Rfl:    azelastine (ASTELIN) 0.1 % nasal spray, SMARTSIG:1-2 Spray(s) Both Nares Every 12 Hours PRN, Disp: , Rfl:    b complex vitamins tablet, Take 2 tablets  by mouth daily., Disp: , Rfl:    BLACK ELDERBERRY,BERRY-FLOWER, PO, Take 2 capsules by mouth daily. , Disp: , Rfl:    Calcium Carb-Cholecalciferol (CALCIUM 500 +  D3 PO), Take 2 Doses by mouth daily. , Disp: , Rfl:    CRANBERRY SOFT PO, Take 2 each by mouth daily. , Disp: , Rfl:    Digestive Enzymes (DIGESTIVE ENZYME PO), Take 1-2 capsules by mouth daily., Disp: , Rfl:    levothyroxine (SYNTHROID) 75 MCG tablet, TAKE 1 TABLET(75 MCG) BY MOUTH DAILY, Disp: 90 tablet, Rfl: 1   PRESCRIPTION MEDICATION, Place 1 tablet vaginally 3 (three) times a week. E3 '1mg'$  and Testosterone '1mg'$  compound, Disp: , Rfl:    sodium chloride (MURO 128) 5 % ophthalmic solution, Place 1 drop into both eyes daily., Disp: , Rfl:    Zinc 50 MG CAPS, Take 50 mg by mouth daily. (Patient not taking: Reported on 05/26/2022), Disp: , Rfl:  No current facility-administered medications for this visit.  Facility-Administered Medications Ordered in Other Visits:    0.9 %  sodium chloride infusion, , Intravenous, Once, Sindy Guadeloupe, MD  Physical exam:  Vitals:   05/26/22 1040  BP: 113/70  Pulse: 71  Resp: 16  SpO2: 97%  Weight: 153 lb 1.6 oz (69.4 kg)   Physical Exam Constitutional:      General: She is not in acute distress. HENT:     Mouth/Throat:     Mouth: Mucous membranes are moist.     Pharynx: Oropharynx is clear.  Cardiovascular:     Rate and Rhythm: Normal rate and regular rhythm.     Heart sounds: Normal heart sounds.  Pulmonary:     Effort: Pulmonary effort is normal.     Breath sounds: Normal breath sounds.  Abdominal:     General: Bowel sounds are normal.     Palpations: Abdomen is soft.  Lymphadenopathy:     Comments: No palpable bilateral cervical adenopathy  Skin:    General: Skin is warm and dry.  Neurological:     Mental Status: She is alert and oriented to person, place, and time.         Latest Ref Rng & Units 05/26/2022   10:05 AM  CMP  Glucose 70 - 99 mg/dL 102   BUN 8 - 23 mg/dL 20   Creatinine 0.44 - 1.00 mg/dL 0.84   Sodium 135 - 145 mmol/L 138   Potassium 3.5 - 5.1 mmol/L 4.0   Chloride 98 - 111 mmol/L 104   CO2 22 - 32 mmol/L 28    Calcium 8.9 - 10.3 mg/dL 9.5   Total Protein 6.5 - 8.1 g/dL 6.4   Total Bilirubin 0.3 - 1.2 mg/dL 1.1   Alkaline Phos 38 - 126 U/L 58   AST 15 - 41 U/L 23   ALT 0 - 44 U/L 22       Latest Ref Rng & Units 05/26/2022   10:05 AM  CBC  WBC 4.0 - 10.5 K/uL 3.9   Hemoglobin 12.0 - 15.0 g/dL 13.7   Hematocrit 36.0 - 46.0 % 40.5   Platelets 150 - 400 K/uL 139      Assessment and plan- Patient is a 66 y.o. female with a history of HPV positive squamous cell carcinoma of the oropharynx stage I status post concurrent chemoradiation and currently in remission.  This is a routine follow-up visit  Clinically patient is doing well with no concerning signs and symptoms of recurrence based on today's  exam.  She continues to follow-up with ENT as well for oropharyngeal exams.  We are now inching closer to 5 years of surveillance and therefore she does not require any further follow-up with me.  Patient has mild waxing and waning leukopenia and mild chronic thrombocytopenia which is remained stable and does not require any follow-up as such but she can get her CBC checked on a yearly basis by her PCP.   Visit Diagnosis 1. Encounter for follow-up surveillance of head and neck cancer      Dr. Randa Evens, MD, MPH [facility] [phone] 05/26/2022 1:08 PM

## 2022-06-04 DIAGNOSIS — S8391XA Sprain of unspecified site of right knee, initial encounter: Secondary | ICD-10-CM | POA: Diagnosis not present

## 2022-06-08 ENCOUNTER — Ambulatory Visit (INDEPENDENT_AMBULATORY_CARE_PROVIDER_SITE_OTHER): Payer: PPO

## 2022-06-08 DIAGNOSIS — I441 Atrioventricular block, second degree: Secondary | ICD-10-CM

## 2022-06-09 ENCOUNTER — Telehealth: Payer: Self-pay | Admitting: Internal Medicine

## 2022-06-09 NOTE — Telephone Encounter (Signed)
Pt states that she received a message about a missed transmission. She states that she is not at home due to her mother having a fall and doesn't know when she will be able to send transmission in and would like a call back to discuss this. Requesting return call.

## 2022-06-09 NOTE — Telephone Encounter (Signed)
I spoke with the patient and she agreed to send missed transmission tomorrow.

## 2022-06-12 LAB — CUP PACEART REMOTE DEVICE CHECK
Battery Impedance: 335 Ohm
Battery Remaining Longevity: 116 mo
Battery Voltage: 2.78 V
Brady Statistic AP VP Percent: 0 %
Brady Statistic AP VS Percent: 17 %
Brady Statistic AS VP Percent: 0 %
Brady Statistic AS VS Percent: 83 %
Date Time Interrogation Session: 20231130133304
Implantable Lead Connection Status: 753985
Implantable Lead Connection Status: 753985
Implantable Lead Implant Date: 20061128
Implantable Lead Implant Date: 20061128
Implantable Lead Location: 753859
Implantable Lead Location: 753860
Implantable Lead Model: 4092
Implantable Lead Model: 4592
Implantable Pulse Generator Implant Date: 20161123
Lead Channel Impedance Value: 636 Ohm
Lead Channel Impedance Value: 727 Ohm
Lead Channel Pacing Threshold Amplitude: 0.625 V
Lead Channel Pacing Threshold Amplitude: 1.125 V
Lead Channel Pacing Threshold Pulse Width: 0.4 ms
Lead Channel Pacing Threshold Pulse Width: 0.4 ms
Lead Channel Setting Pacing Amplitude: 2 V
Lead Channel Setting Pacing Amplitude: 2.5 V
Lead Channel Setting Pacing Pulse Width: 0.4 ms
Lead Channel Setting Sensing Sensitivity: 4 mV
Zone Setting Status: 755011
Zone Setting Status: 755011

## 2022-06-15 ENCOUNTER — Ambulatory Visit (INDEPENDENT_AMBULATORY_CARE_PROVIDER_SITE_OTHER): Payer: PPO | Admitting: *Deleted

## 2022-06-15 DIAGNOSIS — Z0000 Encounter for general adult medical examination without abnormal findings: Secondary | ICD-10-CM

## 2022-06-15 NOTE — Progress Notes (Signed)
Subjective:   Wendy Kelly is a 66 y.o. female who presents for Medicare Annual (Subsequent) preventive examination.  I connected with  Wendy Kelly on 06/15/22 by a telephone enabled telemedicine application and verified that I am speaking with the correct person using two identifiers.   I discussed the limitations of evaluation and management by telemedicine. The patient expressed understanding and agreed to proceed.  Patient location: home  Provider location: Tele-health-home   Review of Systems     Cardiac Risk Factors include: advanced age (>67mn, >>54women)     Objective:    Today's Vitals   There is no height or weight on file to calculate BMI.     06/15/2022   11:33 AM 05/26/2022   10:35 AM 05/22/2021   11:20 AM 05/20/2021   12:18 PM 05/20/2020    9:47 AM 07/23/2019    9:29 AM 06/26/2019    7:57 AM  Advanced Directives  Does Patient Have a Medical Advance Directive? No Yes Yes Yes No Yes Yes  Type of AIndustrial/product designerof [place] will [place] will Living will;Healthcare Power of [place] will [place] will  Does patient want to make changes to medical advance directive?   No - Patient declined      Copy of [place] Chart? Yes - validated most recent copy scanned in chart (See row information)   No - copy requested  No - copy requested No - copy requested  Would patient like information on creating a medical advance directive?   No - Patient declined   No - Patient declined     Current Medications (verified) Outpatient Encounter Medications as of 06/15/2022  Medication Sig   aspirin 81 MG tablet Take 81 mg by mouth daily.   azelastine (ASTELIN) 0.1 % nasal spray SMARTSIG:1-2 Spray(s) Both Nares Every 12 Hours PRN   b complex vitamins tablet Take 2 tablets by mouth daily.   BLACK  ELDERBERRY,BERRY-FLOWER, PO Take 2 capsules by mouth daily.    Calcium Carb-Cholecalciferol (CALCIUM 500 + D3 PO) Take 2 Doses by mouth daily.    CRANBERRY SOFT PO Take 2 each by mouth daily.    Digestive Enzymes (DIGESTIVE ENZYME PO) Take 1-2 capsules by mouth daily.   levothyroxine (SYNTHROID) 75 MCG tablet TAKE 1 TABLET(75 MCG) BY MOUTH DAILY   PRESCRIPTION MEDICATION Place 1 tablet vaginally 3 (three) times a week. E3 '1mg'$  and Testosterone '1mg'$  compound   sodium chloride (MURO 128) 5 % ophthalmic solution Place 1 drop into both eyes daily.   Zinc 50 MG CAPS Take 50 mg by mouth daily.   Facility-Administered Encounter Medications as of 06/15/2022  Medication   0.9 %  sodium chloride infusion    Allergies (verified) Cefdinir, Cephalosporins, Dairy aid [tilactase], Flagyl [metronidazole], Lactose intolerance (gi), Penicillins, Phenylephrine-guaifenesin, Sulfa antibiotics, Sulfonamide derivatives, Tape, Dexilant [dexlansoprazole], Entex lq [phenylephrine-guaifenesin], Other, Oxycodone, Succinylcholine, Vancomycin, Clarithromycin, Haemophilus influenzae vaccines, Tetracycline, Codeine, Guaifenesin & derivatives, Lactobacillus, Naproxen sodium, Pseudoephedrine, Tetracyclines & related, and Wheat bran   History: Past Medical History:  Diagnosis Date   AA (aortic aneurysm) ([place]    a. 04/2011 Echo: Ao Root: 4.1cm, Asc Ao 4.7cm.   Anxiety    AV block, 2nd degree    a. 05/2005 - s/p MDT Adapta ADDR01 Dual Chamber PPM   Bicuspid aortic valve    Cancer ([place]    Throat   Chest pain  a. 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   COVID-19 virus infection 09/11/2019   Deep vein thrombosis (DVT) of left lower extremity ([place]) 10/03/2017   Dehydration 08/11/2017   Depression    Expressive aphasia    a. ongoing since 04/2011 - seen by neurology - ? TIA vs. Migraine   Facial numbness    a. ongoing since 04/2011 - seen by neurology - ? TIA vs. Migraine   GERD (gastroesophageal reflux disease)    Low blood pressure     Personal history of chemotherapy 2018-2019   Throat   Personal history of radiation therapy 2018-2019   Throat   Presence of permanent cardiac pacemaker    Shingles    pt had on right side of head and his neck   Squamous cell carcinoma 05/2017   lymph node right side of neck   Syncope    a. 04/2011 Echo: EF 55-65%, No RWMA, Gr 1 DD.   Thyroid disease    Past Surgical History:  Procedure Laterality Date   ABDOMINAL WALL MESH  REMOVAL     bladder tack     cataract Bilateral 2004   CHOLECYSTECTOMY  2000   COLONOSCOPY WITH PROPOFOL N/A 06/26/2019   Procedure: COLONOSCOPY WITH PROPOFOL;  Surgeon: Lucilla Lame, MD;  Location: [facility] ENDOSCOPY;  Service: Endoscopy;  Laterality: N/A;   DILATION AND CURETTAGE OF UTERUS     DIRECT LARYNGOSCOPY Right 06/16/2017   Procedure: MICRO DIRECT LARYNGOSCOPY WITH BIOPSY OF RIGHT BASE OF TONGUE;  Surgeon: Carloyn Manner, MD;  Location: [facility] ORS;  Service: ENT;  Laterality: Right;   EP IMPLANTABLE DEVICE N/A 06/04/2015   Procedure: PPM Generator Changeout;  Surgeon: Deboraha Sprang, MD;  Location: [facility] LAB;  Service: Cardiovascular;  Laterality: N/A;   ESOPHAGOGASTRODUODENOSCOPY (EGD) WITH PROPOFOL N/A 12/18/2015   Procedure: ESOPHAGOGASTRODUODENOSCOPY (EGD) WITH gastric biopsy and dilation;  Surgeon: Lucilla Lame, MD;  Location: [place];  Service: Endoscopy;  Laterality: N/A;   EYE SURGERY     INSERT / REPLACE / REMOVE PACEMAKER     IR FLUORO GUIDE PORT INSERTION RIGHT  06/22/2017   IR REMOVAL TUN ACCESS W/ PORT W/O FL MOD SED  04/14/2018   MOHS SURGERY  02/2018   PACEMAKER INSERTION  2006   Medtronic Adapta ADDR01   TONSILLECTOMY     TONSILLECTOMY     VAGINAL HYSTERECTOMY  2013   mad/cope   Family History  Problem Relation Age of Onset   COPD Mother        alive @ 69   Atrial fibrillation Mother    Stroke Father        died @ 6   Breast cancer Maternal Aunt 67   COPD Maternal Grandmother    Ovarian cancer Neg Hx     Diabetes Neg Hx    Social History   Socioeconomic History   Marital status: Married    Spouse name: Not on file   Number of children: Not on file   Years of education: Not on file   Highest education level: Not on file  Occupational History   Not on file  Tobacco Use   Smoking status: Never   Smokeless tobacco: Never   Tobacco comments:    tobacco use -no  Vaping Use   Vaping Use: Never used  Substance and Sexual Activity   Alcohol use: No   Drug use: No   Sexual activity: Yes    Birth control/protection: Surgical  Other Topics Concern   Not on  file  Social History Narrative   Lives in [place] with husband.  Works out regularly.  Works as a Geophysicist/field seismologist - owns own business.    Social Determinants of Health   Financial Resource Strain: Low Risk  (06/15/2022)   Overall Financial Resource Strain (CARDIA)    Difficulty of Paying Living Expenses: Not hard at all  Food Insecurity: No Food Insecurity (06/15/2022)   Hunger Vital Sign    Worried About Running Out of Food in the Last Year: Never true    Ran Out of Food in the Last Year: Never true  Transportation Needs: No Transportation Needs (06/15/2022)   PRAPARE - Hydrologist (Medical): No    Lack of Transportation (Non-Medical): No  Physical Activity: Sufficiently Active (06/15/2022)   Exercise Vital Sign    Days of Exercise per Week: 4 days    Minutes of Exercise per Session: 40 min  Stress: Stress Concern Present (06/15/2022)   [place]    Feeling of Stress : Rather much  Social Connections: Moderately Integrated (06/15/2022)   Social Connection and Isolation Panel [NHANES]    Frequency of Communication with Friends and Family: More than three times a week    Frequency of Social Gatherings with Friends and Family: Three times a week    Attends Religious Services: More than 4 times per year    Active Member of  Clubs or Organizations: No    Attends Archivist Meetings: Never    Marital Status: Married    Tobacco Counseling Counseling given: Not Answered Tobacco comments: tobacco use -no   Clinical Intake:  Pre-visit preparation completed: Yes  Pain : No/denies pain     Diabetes: No  How often do you need to have someone help you when you read instructions, pamphlets, or other written materials from your doctor or pharmacy?: 1 - Never  Diabetic?  no  Interpreter Needed?: No  Information entered by :: Leroy Kennedy LPN   Activities of Daily Living    06/15/2022   11:45 AM 05/04/2022    8:06 AM  In your present state of health, do you have any difficulty performing the following activities:  Hearing? 0 1  Vision? 0 0  Difficulty concentrating or making decisions? 0 0  Walking or climbing stairs? 0 0  Dressing or bathing? 0 0  Doing errands, shopping? 0 0  Preparing Food and eating ? N   Using the Toilet? N   In the past six months, have you accidently leaked urine? N   Do you have problems with loss of bowel control? N   Managing your Medications? N   Managing your Finances? N   Housekeeping or managing your Housekeeping? N     Patient Care Team: Valerie Roys, DO as PCP - General (Family Medicine) Minna Merritts, MD as Consulting Physician (Cardiology) Sindy Guadeloupe, MD as Consulting Physician (Oncology) Carloyn Manner, MD as Referring Physician (Otolaryngology) Noreene Filbert, MD as Referring Physician (Radiation Oncology) Rebekah Chesterfield, LCSW as Social Worker (Licensed Clinical Social Worker)  Indicate any recent Medical Services you may have received from other than [facility] providers in the past year (date may be approximate).     Assessment:   This is a routine wellness examination for Wendy Kelly.  Hearing/Vision screen Hearing Screening - Comments:: No trouble hearing Vision Screening - Comments:: Up to date  Eye  Dietary issues and  exercise activities  discussed: Current Exercise Habits: Home exercise routine, Type of exercise: walking, Time (Minutes): 35, Frequency (Times/Week): 4, Weekly Exercise (Minutes/Week): 140, Intensity: Mild   Goals Addressed             This Visit's Progress    Patient Stated       Maintain current lifestyle       Depression Screen    06/15/2022   11:39 AM 05/04/2022    8:09 AM 09/28/2021   11:02 AM 05/20/2021    1:34 PM 05/01/2021    1:18 PM 04/29/2020    9:16 AM 04/26/2019    8:40 AM  PHQ 2/9 Scores  PHQ - 2 Score 0 0 0 0 0 0 0  PHQ- 9 Score 2 4 0  '5 6 2    '$ Fall Risk    06/15/2022   11:33 AM 05/04/2022    8:10 AM 09/28/2021   11:01 AM 05/20/2021   12:13 PM 05/01/2021    1:22 PM  [place] in the past year? 0 0 0 0 0  Number falls in past yr: 0 0 0 0   Injury with Fall? 0 0 0 0   Risk for fall due to :  No Fall Risks No Fall Risks    Follow up Falls evaluation completed;Education provided;Falls prevention discussed Falls evaluation completed Falls evaluation completed Falls evaluation completed;Education provided;Falls prevention discussed     FALL RISK PREVENTION PERTAINING TO THE HOME:  Any stairs in or around the home? Yes  If so, are there any without handrails? No  Home free of loose throw rugs in walkways, pet beds, electrical cords, etc? Yes  Adequate lighting in your home to reduce risk of falls? Yes   ASSISTIVE DEVICES UTILIZED TO PREVENT FALLS:  Life alert? No  Use of a cane, walker or w/c? No  Grab bars in the bathroom? Yes  Shower chair or bench in shower? Yes  Elevated toilet seat or a handicapped toilet? Yes   TIMED UP AND GO:  Was the test performed? No .    Cognitive Function:        06/15/2022   11:36 AM 04/29/2020    9:44 AM  6CIT Screen  What Year? 0 points 0 points  What month? 0 points 0 points  What time? 0 points 0 points  Count back from 20 0 points 0 points  Months in reverse 0 points 0 points  Repeat phrase 0  points 2 points  Total Score 0 points 2 points    Immunizations Immunization History  Administered Date(s) Administered   Td 05/18/2006, 04/13/2016    TDAP status: Up to date  Flu Vaccine status: Declined, Education has been provided regarding the importance of this vaccine but patient still declined. Advised may receive this vaccine at local pharmacy or Health Dept. Aware to provide a copy of the vaccination record if obtained from local pharmacy or Health Dept. Verbalized acceptance and understanding.  Pneumococcal vaccine status: Declined,  Education has been provided regarding the importance of this vaccine but patient still declined. Advised may receive this vaccine at local pharmacy or Health Dept. Aware to provide a copy of the vaccination record if obtained from local pharmacy or Health Dept. Verbalized acceptance and understanding.   Covid-19 vaccine status: Declined, Education has been provided regarding the importance of this vaccine but patient still declined. Advised may receive this vaccine at local pharmacy or Health Dept.or vaccine clinic. Aware to provide a copy of the  vaccination record if obtained from local pharmacy or Health Dept. Verbalized acceptance and understanding.  Qualifies for Shingles Vaccine? Yes   Zostavax completed No   Shingrix Completed?: No.    Education has been provided regarding the importance of this vaccine. Patient has been advised to call insurance company to determine out of pocket expense if they have not yet received this vaccine. Advised may also receive vaccine at local pharmacy or Health Dept. Verbalized acceptance and understanding.  Screening Tests Health Maintenance  Topic Date Due   COVID-19 Vaccine (1) 07/01/2022 (Originally 01/18/1961)   Zoster Vaccines- Shingrix (1 of 2) 08/04/2022 (Originally 01/17/1975)   INFLUENZA VACCINE  10/10/2022 (Originally 02/09/2022)   Pneumonia Vaccine 40+ Years old (1 - PCV) 05/05/2023 (Originally 01/18/2020)    MAMMOGRAM  06/04/2023   Medicare Annual Wellness (AWV)  06/16/2023   COLONOSCOPY (Pts 45-52yr Insurance coverage will need to be confirmed)  06/25/2024   DTaP/Tdap/Td (3 - Tdap) 04/13/2026   DEXA SCAN  Completed   Hepatitis C Screening  Completed   HPV VACCINES  Aged Out    Health Maintenance  There are no preventive care reminders to display for this patient.   Colorectal cancer screening: Type of screening: Colonoscopy. Completed 2020. Repeat every 5 years  Mammogram will schedule had to cancel   Bone Density status: Completed 2022. Results reflect: Bone density results: OSTEOPENIA. Repeat every 2 years.  Lung Cancer Screening: (Low Dose CT Chest recommended if Age 67-80years, 30 pack-year currently smoking OR have quit w/in 15years.) does not qualify.   Lung Cancer Screening Referral:   Additional Screening:  Hepatitis C Screening: does not qualify; Completed 2019  Vision Screening: Recommended annual ophthalmology exams for early detection of glaucoma and other disorders of the eye. Is the patient up to date with their annual eye exam?  Yes  Who is the provider or what is the name of the office in which the patient attends annual eye exams? [facility] If pt is not established with a provider, would they like to be referred to a provider to establish care? No .   Dental Screening: Recommended annual dental exams for proper oral hygiene  Community Resource Referral / Chronic Care Management: CRR required this visit?  No   CCM required this visit?  No      Plan:     I have personally reviewed and noted the following in the patient's chart:   Medical and social history Use of alcohol, tobacco or illicit drugs  Current medications and supplements including opioid prescriptions. Patient is not currently taking opioid prescriptions. Functional ability and status Nutritional status Physical activity Advanced directives List of other physicians Hospitalizations,  surgeries, and ER visits in previous 12 months Vitals Screenings to include cognitive, depression, and falls Referrals and appointments  In addition, I have reviewed and discussed with patient certain preventive protocols, quality metrics, and best practice recommendations. A written personalized care plan for preventive services as well as general preventive health recommendations were provided to patient.     JLeroy Kennedy LPN   [id number]  Nurse Notes:

## 2022-06-15 NOTE — Patient Instructions (Signed)
Wendy Kelly , Thank you for taking time to come for your Medicare Wellness Visit. I appreciate your ongoing commitment to your health goals. Please review the following plan we discussed and let me know if I can assist you in the future.   These are the goals we discussed:  Goals      Patient Stated     Maintain current lifestyle      Patient Stated     Maintain current lifestyle     SW-Manage My Emotions     Timeframe:  Long-Range Goal Priority:  Medium Start Date:   10/10/20                  Expected End Date: 06/10/21              Follow Up Date-05/25/21   Patient Goals: Attend all scheduled appointments with providers Contact clinic with any questions or concerns that may arise Utilize strategies discussed to assist with management of symptoms        This is a list of the screening recommended for you and due dates:  Health Maintenance  Topic Date Due   COVID-19 Vaccine (1) 07/01/2022*   Zoster (Shingles) Vaccine (1 of 2) 08/04/2022*   Flu Shot  10/10/2022*   Pneumonia Vaccine (1 - PCV) 05/05/2023*   Mammogram  06/04/2023   Medicare Annual Wellness Visit  06/16/2023   Colon Cancer Screening  06/25/2024   DTaP/Tdap/Td vaccine (3 - Tdap) 04/13/2026   DEXA scan (bone density measurement)  Completed   Hepatitis C Screening: USPSTF Recommendation to screen - Ages 33-79 yo.  Completed   HPV Vaccine  Aged Out  *Topic was postponed. The date shown is not the original due date.    Advanced directives: on file    Preventive Care 29 Years and Older, Female Preventive care refers to lifestyle choices and visits with your health care provider that can promote health and wellness. What does preventive care include? A yearly physical exam. This is also called an annual well check. Dental exams once or twice a year. Routine eye exams. Ask your health care provider how often you should have your eyes checked. Personal lifestyle choices, including: Daily care of your teeth  and gums. Regular physical activity. Eating a healthy diet. Avoiding tobacco and drug use. Limiting alcohol use. Practicing safe sex. Taking low-dose aspirin every day. Taking vitamin and mineral supplements as recommended by your health care provider. What happens during an annual well check? The services and screenings done by your health care provider during your annual well check will depend on your age, overall health, lifestyle risk factors, and family history of disease. Counseling  Your health care provider may ask you questions about your: Alcohol use. Tobacco use. Drug use. Emotional well-being. Home and relationship well-being. Sexual activity. Eating habits. History of falls. Memory and ability to understand (cognition). Work and work Statistician. Reproductive health. Screening  You may have the following tests or measurements: Height, weight, and BMI. Blood pressure. Lipid and cholesterol levels. These may be checked every 5 years, or more frequently if you are over 24 years old. Skin check. Lung cancer screening. You may have this screening every year starting at age 59 if you have a 30-pack-year history of smoking and currently smoke or have quit within the past 15 years. Fecal occult blood test (FOBT) of the stool. You may have this test every year starting at age 76. Flexible sigmoidoscopy or colonoscopy. You may have a  sigmoidoscopy every 5 years or a colonoscopy every 10 years starting at age 33. Hepatitis C blood test. Hepatitis B blood test. Sexually transmitted disease (STD) testing. Diabetes screening. This is done by checking your blood sugar (glucose) after you have not eaten for a while (fasting). You may have this done every 1-3 years. Bone density scan. This is done to screen for osteoporosis. You may have this done starting at age 67. Mammogram. This may be done every 1-2 years. Talk to your health care provider about how often you should have regular  mammograms. Talk with your health care provider about your test results, treatment options, and if necessary, the need for more tests. Vaccines  Your health care provider may recommend certain vaccines, such as: Influenza vaccine. This is recommended every year. Tetanus, diphtheria, and acellular pertussis (Tdap, Td) vaccine. You may need a Td booster every 10 years. Zoster vaccine. You may need this after age 28. Pneumococcal 13-valent conjugate (PCV13) vaccine. One dose is recommended after age 24. Pneumococcal polysaccharide (PPSV23) vaccine. One dose is recommended after age 10. Talk to your health care provider about which screenings and vaccines you need and how often you need them. This information is not intended to replace advice given to you by your health care provider. Make sure you discuss any questions you have with your health care provider. Document Released: 07/25/2015 Document Revised: 03/17/2016 Document Reviewed: 04/29/2015 Elsevier Interactive Patient Education  2017 [place] Prevention in the Home Falls can cause injuries. They can happen to people of all ages. There are many things you can do to make your home safe and to help prevent falls. What can I do on the outside of my home? Regularly fix the edges of walkways and driveways and fix any cracks. Remove anything that might make you trip as you walk through a door, such as a raised step or threshold. Trim any bushes or trees on the path to your home. Use bright outdoor lighting. Clear any walking paths of anything that might make someone trip, such as rocks or tools. Regularly check to see if handrails are loose or broken. Make sure that both sides of any steps have handrails. Any raised decks and porches should have guardrails on the edges. Have any leaves, snow, or ice cleared regularly. Use sand or salt on walking paths during winter. Clean up any spills in your garage right away. This includes oil  or grease spills. What can I do in the bathroom? Use night lights. Install grab bars by the toilet and in the tub and shower. Do not use towel bars as grab bars. Use non-skid mats or decals in the tub or shower. If you need to sit down in the shower, use a plastic, non-slip stool. Keep the floor dry. Clean up any water that spills on the floor as soon as it happens. Remove soap buildup in the tub or shower regularly. Attach bath mats securely with double-sided non-slip rug tape. Do not have throw rugs and other things on the floor that can make you trip. What can I do in the bedroom? Use night lights. Make sure that you have a light by your bed that is easy to reach. Do not use any sheets or blankets that are too big for your bed. They should not hang down onto the floor. Have a firm chair that has side arms. You can use this for support while you get dressed. Do not have throw rugs and other  things on the floor that can make you trip. What can I do in the kitchen? Clean up any spills right away. Avoid walking on wet floors. Keep items that you use a lot in easy-to-reach places. If you need to reach something above you, use a strong step stool that has a grab bar. Keep electrical cords out of the way. Do not use floor polish or wax that makes floors slippery. If you must use wax, use non-skid floor wax. Do not have throw rugs and other things on the floor that can make you trip. What can I do with my stairs? Do not leave any items on the stairs. Make sure that there are handrails on both sides of the stairs and use them. Fix handrails that are broken or loose. Make sure that handrails are as long as the stairways. Check any carpeting to make sure that it is firmly attached to the stairs. Fix any carpet that is loose or worn. Avoid having throw rugs at the top or bottom of the stairs. If you do have throw rugs, attach them to the floor with carpet tape. Make sure that you have a light  switch at the top of the stairs and the bottom of the stairs. If you do not have them, ask someone to add them for you. What else can I do to help prevent falls? Wear shoes that: Do not have high heels. Have rubber bottoms. Are comfortable and fit you well. Are closed at the toe. Do not wear sandals. If you use a stepladder: Make sure that it is fully opened. Do not climb a closed stepladder. Make sure that both sides of the stepladder are locked into place. Ask someone to hold it for you, if possible. Clearly mark and make sure that you can see: Any grab bars or handrails. First and last steps. Where the edge of each step is. Use tools that help you move around (mobility aids) if they are needed. These include: Canes. Walkers. Scooters. Crutches. Turn on the lights when you go into a dark area. Replace any light bulbs as soon as they burn out. Set up your furniture so you have a clear path. Avoid moving your furniture around. If any of your floors are uneven, fix them. If there are any pets around you, be aware of where they are. Review your medicines with your doctor. Some medicines can make you feel dizzy. This can increase your chance of falling. Ask your doctor what other things that you can do to help prevent falls. This information is not intended to replace advice given to you by your health care provider. Make sure you discuss any questions you have with your health care provider. Document Released: 04/24/2009 Document Revised: 12/04/2015 Document Reviewed: 08/02/2014 Elsevier Interactive Patient Education  2017 [employer].

## 2022-06-17 NOTE — Progress Notes (Signed)
I, as supervising physician, have reviewed the nurse health advisor's Medicare Wellness Visit note for this patient and concur with the findings and recommendations listed above.

## 2022-06-18 ENCOUNTER — Telehealth: Payer: Self-pay | Admitting: *Deleted

## 2022-06-18 NOTE — Telephone Encounter (Signed)
Request for refill:    Patient Sig: Place 1 tablet vaginally 3 (three) times a week. E3 '1mg'$  and Testosterone '1mg'$  compound Authorized By: [provider] Admin Instructions: E3 '1mg'$  and Testosterone '1mg'$  compound  Unable to send request due to way it is listed- also historical- will send for provider review

## 2022-06-21 ENCOUNTER — Encounter: Payer: Self-pay | Admitting: Family Medicine

## 2022-06-22 NOTE — Telephone Encounter (Signed)
Rx written- please fax to [facility]

## 2022-06-29 ENCOUNTER — Ambulatory Visit
Admission: RE | Admit: 2022-06-29 | Discharge: 2022-06-29 | Disposition: A | Discharge status: Home / Self Care | Payer: PPO | Source: Ambulatory Visit | Attending: Family Medicine | Admitting: Family Medicine

## 2022-06-29 DIAGNOSIS — Z1231 Encounter for screening mammogram for malignant neoplasm of breast: Secondary | ICD-10-CM | POA: Diagnosis not present

## 2022-07-02 DIAGNOSIS — R519 Headache, unspecified: Secondary | ICD-10-CM | POA: Diagnosis not present

## 2022-07-02 DIAGNOSIS — Z08 Encounter for follow-up examination after completed treatment for malignant neoplasm: Secondary | ICD-10-CM | POA: Diagnosis not present

## 2022-07-02 DIAGNOSIS — Z85810 Personal history of malignant neoplasm of tongue: Secondary | ICD-10-CM | POA: Diagnosis not present

## 2022-07-02 DIAGNOSIS — J301 Allergic rhinitis due to pollen: Secondary | ICD-10-CM | POA: Diagnosis not present

## 2022-08-02 ENCOUNTER — Ambulatory Visit: Payer: Self-pay

## 2022-08-02 ENCOUNTER — Ambulatory Visit (INDEPENDENT_AMBULATORY_CARE_PROVIDER_SITE_OTHER): Payer: PPO | Admitting: Nurse Practitioner

## 2022-08-02 ENCOUNTER — Encounter: Payer: Self-pay | Admitting: Nurse Practitioner

## 2022-08-02 VITALS — BP 105/72 | HR 80 | Temp 98.1°F | Ht 62.99 in | Wt 157.1 lb

## 2022-08-02 DIAGNOSIS — R399 Unspecified symptoms and signs involving the genitourinary system: Secondary | ICD-10-CM | POA: Insufficient documentation

## 2022-08-02 DIAGNOSIS — R8281 Pyuria: Secondary | ICD-10-CM

## 2022-08-02 NOTE — Assessment & Plan Note (Signed)
Acute for a few days -- UA overall stable with trace leuks, will send for culture to further assess.  Wet prep negative.  Discussed results with patient.  She will trial some essential oils for muscle related discomfort and heart burn at home -- ?GERD, as tender to epigastric area or muscular pain to pelvic area.  Treat as needed based on culture.  If worsening or ongoing pain, return to clinic and we will obtain blood work and possible imaging to further assess.

## 2022-08-02 NOTE — Patient Instructions (Signed)
Urinary Tract Infection, Adult ?A urinary tract infection (UTI) is an infection of any part of the urinary tract. The urinary tract includes: ?The kidneys. ?The ureters. ?The bladder. ?The urethra. ?These organs make, store, and get rid of pee (urine) in the body. ?What are the causes? ?This infection is caused by germs (bacteria) in your genital area. These germs grow and cause swelling (inflammation) of your urinary tract. ?What increases the risk? ?The following factors may make you more likely to develop this condition: ?Using a small, thin tube (catheter) to drain pee. ?Not being able to control when you pee or poop (incontinence). ?Being female. If you are female, these things can increase the risk: ?Using these methods to prevent pregnancy: ?A medicine that kills sperm (spermicide). ?A device that blocks sperm (diaphragm). ?Having low levels of a female hormone (estrogen). ?Being pregnant. ?You are more likely to develop this condition if: ?You have genes that add to your risk. ?You are sexually active. ?You take antibiotic medicines. ?You have trouble peeing because of: ?A prostate that is bigger than normal, if you are female. ?A blockage in the part of your body that drains pee from the bladder. ?A kidney stone. ?A nerve condition that affects your bladder. ?Not getting enough to drink. ?Not peeing often enough. ?You have other conditions, such as: ?Diabetes. ?A weak disease-fighting system (immune system). ?Sickle cell disease. ?Gout. ?Injury of the spine. ?What are the signs or symptoms? ?Symptoms of this condition include: ?Needing to pee right away. ?Peeing small amounts often. ?Pain or burning when peeing. ?Blood in the pee. ?Pee that smells bad or not like normal. ?Trouble peeing. ?Pee that is cloudy. ?Fluid coming from the vagina, if you are female. ?Pain in the belly or lower back. ?Other symptoms include: ?Vomiting. ?Not feeling hungry. ?Feeling mixed up (confused). This may be the first symptom in  older adults. ?Being tired and grouchy (irritable). ?A fever. ?Watery poop (diarrhea). ?How is this treated? ?Taking antibiotic medicine. ?Taking other medicines. ?Drinking enough water. ?In some cases, you may need to see a specialist. ?Follow these instructions at home: ? ?Medicines ?Take over-the-counter and prescription medicines only as told by your doctor. ?If you were prescribed an antibiotic medicine, take it as told by your doctor. Do not stop taking it even if you start to feel better. ?General instructions ?Make sure you: ?Pee until your bladder is empty. ?Do not hold pee for a long time. ?Empty your bladder after sex. ?Wipe from front to back after peeing or pooping if you are a female. Use each tissue one time when you wipe. ?Drink enough fluid to keep your pee pale yellow. ?Keep all follow-up visits. ?Contact a doctor if: ?You do not get better after 1-2 days. ?Your symptoms go away and then come back. ?Get help right away if: ?You have very bad back pain. ?You have very bad pain in your lower belly. ?You have a fever. ?You have chills. ?You feeling like you will vomit or you vomit. ?Summary ?A urinary tract infection (UTI) is an infection of any part of the urinary tract. ?This condition is caused by germs in your genital area. ?There are many risk factors for a UTI. ?Treatment includes antibiotic medicines. ?Drink enough fluid to keep your pee pale yellow. ?This information is not intended to replace advice given to you by your health care provider. Make sure you discuss any questions you have with your health care provider. ?Document Revised: 02/08/2020 Document Reviewed: 02/08/2020 ?Elsevier Patient Education ?   [place].

## 2022-08-02 NOTE — Progress Notes (Signed)
BP 105/72   Pulse 80   Temp 98.1 F (36.7 C) (Oral)   Ht 5' 2.99" (1.6 m)   Wt 157 lb 1.6 oz (71.3 kg)   SpO2 95%   BMI 27.84 kg/m    Subjective:    Patient ID: Wendy Kelly, female    DOB: 05-23-1956, 67 y.o.   MRN: [id number]  HPI: Wendy Kelly is a 67 y.o. female  Chief Complaint  Patient presents with   Abdominal Pain    And back pain, Started on Saturday.    URINARY SYMPTOMS Started with symptoms on 07/30/22 in the evening.  Was having back and abdominal pain.  Did her essential oil regimen for UTI treatment at home, this helped her Sunday.  Then last night the pain became worse.  Not feeling like herself.  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 Regular bowel movements at home. Dysuria: no -- notices it more after urinates Urinary frequency: at baseline Urgency: at baseline Small volume voids: no Symptom severity: yes Urinary incontinence: no Foul odor: no Hematuria: no Abdominal pain: yes Back pain: no Suprapubic pain/pressure: yes Flank pain: no Fever:  no Vomiting: no Relief with cranberry juice:  takes these at baseline  = cranberry pills Status: stable Previous urinary tract infection: yes, only a few in past Recurrent urinary tract infection: no Sexual activity: monogamous History of sexually transmitted disease: no Treatments attempted: essential oils and cranberry tablets, increasing fluids    Relevant past medical, surgical, family and social history reviewed and updated as indicated. Interim medical history since our last visit reviewed. Allergies and medications reviewed and updated.  Review of Systems  Constitutional:  Negative for activity change, appetite change, diaphoresis, fatigue and fever.  Respiratory:  Negative for cough, chest tightness and shortness of breath.   Cardiovascular:  Negative for chest pain, palpitations and leg swelling.  Gastrointestinal:  Positive for abdominal pain.  Negative for abdominal distention, constipation, diarrhea, nausea and vomiting.  Genitourinary:  Positive for frequency (baseline) and urgency (baseline). Negative for decreased urine volume, difficulty urinating, dysuria, flank pain, hematuria and vaginal bleeding.  Psychiatric/Behavioral: Negative.     Per HPI unless specifically indicated above     Objective:    BP 105/72   Pulse 80   Temp 98.1 F (36.7 C) (Oral)   Ht 5' 2.99" (1.6 m)   Wt 157 lb 1.6 oz (71.3 kg)   SpO2 95%   BMI 27.84 kg/m   Wt Readings from Last 3 Encounters:  08/02/22 157 lb 1.6 oz (71.3 kg)  05/26/22 153 lb 1.6 oz (69.4 kg)  05/04/22 155 lb (70.3 kg)    Physical Exam Vitals and nursing note reviewed.  Constitutional:      General: She is awake. She is not in acute distress.    Appearance: She is well-developed and well-groomed. She is not ill-appearing or toxic-appearing.  HENT:     Head: Normocephalic.     Right Ear: Hearing and external ear normal.     Left Ear: Hearing and external ear normal.  Eyes:     General: Lids are normal.        Right eye: No discharge.        Left eye: No discharge.     Conjunctiva/sclera: Conjunctivae normal.     Pupils: Pupils are equal, round, and reactive to light.  Neck:     Thyroid: No thyromegaly.     Vascular: No carotid bruit.  Cardiovascular:  Rate and Rhythm: Normal rate and regular rhythm.     Heart sounds: Normal heart sounds. No murmur heard.    No gallop.  Pulmonary:     Effort: Pulmonary effort is normal. No accessory muscle usage or respiratory distress.     Breath sounds: Normal breath sounds.  Abdominal:     General: Bowel sounds are normal. There is no distension.     Palpations: Abdomen is soft. There is no hepatomegaly.     Tenderness: There is abdominal tenderness in the right lower quadrant and epigastric area. There is no right CVA tenderness, left CVA tenderness, guarding or rebound. Negative signs include Rovsing's sign and McBurney's  sign.     Hernia: No hernia is present.     Comments: Mild tenderness noted.  Musculoskeletal:     Cervical back: Normal range of motion and neck supple.     Right lower leg: No edema.     Left lower leg: No edema.  Skin:    General: Skin is warm and dry.  Neurological:     Mental Status: She is alert and oriented to person, place, and time.  Psychiatric:        Attention and Perception: Attention normal.        Mood and Affect: Mood normal.        Speech: Speech normal.        Behavior: Behavior normal. Behavior is cooperative.        Thought Content: Thought content normal.    Results for orders placed or performed in visit on 06/08/22  CUP PACEART REMOTE DEVICE CHECK  Result Value Ref Range   Date Time Interrogation Session [id number]    Pulse Generator Manufacturer MERM    Pulse Gen Model [id number] Adapta    Pulse Gen Serial Number [id number]    Clinic Name [facility] Pulse Generator Type Implantable Pulse Generator    Implantable Pulse Generator Implant Date 10272536    Implantable Lead Manufacturer [employer]    Implantable Lead Model 4092 CapSure SP Novus    Implantable Lead Serial Number [id number]    Implantable Lead Implant Date [id number]    Implantable Lead Location [id number]    Implantable Lead Connection Status [id number]    Implantable Lead Manufacturer [employer]M    Implantable Lead Model 4592 CapSure SP Novus    Implantable Lead Serial Number [id number]    Implantable Lead Implant Date [id number]    Implantable Lead Location [id number]    Implantable Lead Connection Status [id number]    Lead Channel Setting Sensing Sensitivity 4.00 mV   Lead Channel Setting Pacing Amplitude 2.000 V   Lead Channel Setting Pacing Pulse Width 0.40 ms   Lead Channel Setting Pacing Amplitude 2.500 V   Zone Setting Status 755011    Zone Setting Status 755011    Lead Channel Impedance Value 636 ohm   Lead Channel Pacing Threshold Amplitude 0.625 V   Lead Channel Pacing Threshold Pulse  Width 0.40 ms   Lead Channel Impedance Value 727 ohm   Lead Channel Pacing Threshold Amplitude 1.125 V   Lead Channel Pacing Threshold Pulse Width 0.40 ms   Battery Status OK    Battery Remaining Longevity 116 mo   Battery Voltage 2.78 V   Battery Impedance 335 ohm   Brady Statistic AP VP Percent 0 %   Brady Statistic AS VP Percent 0 %   Brady Statistic AP VS Percent 17 %   Brady Statistic AS VS Percent 83 %  Eval Rhythm SR at 82 bpm       Assessment & Plan:   Problem List Items Addressed This Visit       Other   Urinary symptom or sign - Primary    Acute for a few days -- UA overall stable with trace leuks, will send for culture to further assess.  Wet prep negative.  Discussed results with patient.  She will trial some essential oils for muscle related discomfort and heart burn at home -- ?GERD, as tender to epigastric area or muscular pain to pelvic area.  Treat as needed based on culture.  If worsening or ongoing pain, return to clinic and we will obtain blood work and possible imaging to further assess.      Relevant Orders   Urinalysis, Routine w reflex microscopic   WET PREP FOR TRICH, YEAST, CLUE   Other Visit Diagnoses     Pyuria       Urine sent for culture.   Relevant Orders   Urine Culture        Follow up plan: Return if symptoms worsen or fail to improve.

## 2022-08-02 NOTE — Telephone Encounter (Signed)
Reason for Disposition  [1] SEVERE back pain (e.g., excruciating, unable to do any normal activities) AND [2] not improved 2 hours after pain medicine  Answer Assessment - Initial Assessment Questions 1. ONSET: "When did the pain begin?"      A few days 2. LOCATION: "Where does it hurt?" (upper, mid or lower back)     back 3. SEVERITY: "How bad is the pain?"  (e.g., Scale 1-10; mild, moderate, or severe)   - MILD (1-3): Doesn't interfere with normal activities.    - MODERATE (4-7): Interferes with normal activities or awakens from sleep.    - SEVERE (8-10): Excruciating pain, unable to do any normal activities.      moderate 4. PATTERN: "Is the pain constant?" (e.g., yes, no; constant, intermittent)      constant 5. RADIATION: "Does the pain shoot into your legs or somewhere else?"      6. CAUSE:  "What do you think is causing the back pain?"      Uti - kidney stones 7. BACK OVERUSE:  "Any recent lifting of heavy objects, strenuous work or exercise?"     no 8. MEDICINES: "What have you taken so far for the pain?" (e.g., nothing, acetaminophen, NSAIDS)      9. NEUROLOGIC SYMPTOMS: "Do you have any weakness, numbness, or problems with bowel/bladder control?"     no 10. OTHER SYMPTOMS: "Do you have any other symptoms?" (e.g., fever, abdomen pain, burning with urination, blood in urine)        11. PREGNANCY: "Is there any chance you are pregnant?" "When was your last menstrual period?"  Protocols used: Back Pain-A-AH

## 2022-08-03 LAB — URINALYSIS, ROUTINE W REFLEX MICROSCOPIC
Bilirubin, UA: NEGATIVE
Glucose, UA: NEGATIVE
Ketones, UA: NEGATIVE
Nitrite, UA: NEGATIVE
Protein,UA: NEGATIVE
RBC, UA: NEGATIVE
Specific Gravity, UA: 1.02 (ref 1.005–1.030)
Urobilinogen, Ur: 0.2 mg/dL (ref 0.2–1.0)
pH, UA: 5.5 (ref 5.0–7.5)

## 2022-08-03 LAB — MICROSCOPIC EXAMINATION: Bacteria, UA: NONE SEEN

## 2022-08-03 LAB — WET PREP FOR TRICH, YEAST, CLUE
Clue Cell Exam: NEGATIVE
Trichomonas Exam: NEGATIVE
Yeast Exam: NEGATIVE

## 2022-08-04 LAB — URINE CULTURE

## 2022-08-04 NOTE — Progress Notes (Signed)
Contacted via [place] morning Wendy Kelly, your urine culture has returned and shows no growth.  If pain continues please return to office for further assessment:) Good morning Wendy Kelly, your urine culture has returned and shows no growth.  If pain continues please return to office for further assessment:)

## 2022-08-23 ENCOUNTER — Ambulatory Visit
Admission: RE | Admit: 2022-08-23 | Discharge: 2022-08-23 | Disposition: A | Discharge status: Home / Self Care | Payer: PPO | Attending: Family Medicine | Admitting: Family Medicine

## 2022-08-23 ENCOUNTER — Encounter: Payer: Self-pay | Admitting: Family Medicine

## 2022-08-23 ENCOUNTER — Ambulatory Visit
Admission: RE | Admit: 2022-08-23 | Discharge: 2022-08-23 | Disposition: A | Discharge status: Home / Self Care | Payer: PPO | Source: Ambulatory Visit | Attending: Family Medicine | Admitting: Family Medicine

## 2022-08-23 ENCOUNTER — Ambulatory Visit (INDEPENDENT_AMBULATORY_CARE_PROVIDER_SITE_OTHER): Payer: PPO | Admitting: Family Medicine

## 2022-08-23 VITALS — BP 118/78 | HR 76 | Ht 63.0 in | Wt 155.0 lb

## 2022-08-23 DIAGNOSIS — M5417 Radiculopathy, lumbosacral region: Secondary | ICD-10-CM | POA: Diagnosis not present

## 2022-08-23 DIAGNOSIS — M545 Low back pain, unspecified: Secondary | ICD-10-CM | POA: Diagnosis not present

## 2022-08-23 DIAGNOSIS — M5136 Other intervertebral disc degeneration, lumbar region: Secondary | ICD-10-CM | POA: Diagnosis not present

## 2022-08-23 DIAGNOSIS — M25561 Pain in right knee: Secondary | ICD-10-CM

## 2022-08-23 DIAGNOSIS — M5137 Other intervertebral disc degeneration, lumbosacral region: Secondary | ICD-10-CM | POA: Diagnosis not present

## 2022-08-23 MED ORDER — MELOXICAM 7.5 MG PO TABS: 7.5000 mg | ORAL_TABLET | Freq: Every day | ORAL | 0 refills | Status: DC

## 2022-08-23 NOTE — Patient Instructions (Signed)
-   Obtain x-rays - Start meloxicam 7.5 mg daily with food - If tolerating fine and still symptomatic, increase to 2 tabs (15 mg) daily with food and contact us for refills - Start exercises, work with trainer, do not push through pain - Obtain outside knee x-ray report records and send to [place] - Consider acupuncture for low back and knee / lower leg - Return in 6 weeks

## 2022-09-02 NOTE — Assessment & Plan Note (Signed)
Following acute on chronic onset of right knee pain anteriorly. Began to note progressive symptoms along right leg, exam with modified SLR positive. Plan for lumbar spine x-rays, will be placed on meloxicam, close follow-up.

## 2022-09-02 NOTE — Assessment & Plan Note (Signed)
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 Plan for meloxicam regimen and close follow-up.

## 2022-09-05 ENCOUNTER — Encounter: Payer: Self-pay | Admitting: Family Medicine

## 2022-09-06 NOTE — Telephone Encounter (Signed)
FYI

## 2022-09-07 ENCOUNTER — Other Ambulatory Visit: Payer: Self-pay | Admitting: Family Medicine

## 2022-09-07 ENCOUNTER — Ambulatory Visit: Payer: PPO

## 2022-09-07 DIAGNOSIS — M25561 Pain in right knee: Secondary | ICD-10-CM

## 2022-09-07 DIAGNOSIS — M5417 Radiculopathy, lumbosacral region: Secondary | ICD-10-CM

## 2022-09-07 DIAGNOSIS — I441 Atrioventricular block, second degree: Secondary | ICD-10-CM | POA: Diagnosis not present

## 2022-09-07 MED ORDER — MELOXICAM 7.5 MG PO TABS: 7.5000 mg | ORAL_TABLET | Freq: Every day | ORAL | 0 refills | Status: DC

## 2022-09-08 ENCOUNTER — Telehealth: Payer: Self-pay

## 2022-09-08 NOTE — Telephone Encounter (Signed)
Copied from [place] [phone]. Topic: General - Inquiry >> Sep 08, 2022 10:45 AM Marcellus Scott wrote: Reason for CRM: Pt stated that Dr.Matthews is waiting on the x-ray's results from [facility]; she stated that she spoke with them and confirmed that they were sent in on 02/14 at 4:11.  She stated she also left a message asking for them to resend.  Please advise.

## 2022-09-09 ENCOUNTER — Other Ambulatory Visit: Payer: Self-pay

## 2022-09-09 ENCOUNTER — Telehealth: Payer: Self-pay | Admitting: Family Medicine

## 2022-09-09 DIAGNOSIS — M25561 Pain in right knee: Secondary | ICD-10-CM

## 2022-09-09 DIAGNOSIS — M5417 Radiculopathy, lumbosacral region: Secondary | ICD-10-CM

## 2022-09-09 LAB — CUP PACEART REMOTE DEVICE CHECK
Battery Impedance: 385 Ohm
Battery Remaining Longevity: 111 mo
Battery Voltage: 2.78 V
Brady Statistic AP VP Percent: 0 %
Brady Statistic AP VS Percent: 17 %
Brady Statistic AS VP Percent: 0 %
Brady Statistic AS VS Percent: 83 %
Date Time Interrogation Session: 20240228211848
Implantable Lead Connection Status: 753985
Implantable Lead Connection Status: 753985
Implantable Lead Implant Date: 20061128
Implantable Lead Implant Date: 20061128
Implantable Lead Location: 753859
Implantable Lead Location: 753860
Implantable Lead Model: 4092
Implantable Lead Model: 4592
Implantable Pulse Generator Implant Date: 20161123
Lead Channel Impedance Value: 565 Ohm
Lead Channel Impedance Value: 765 Ohm
Lead Channel Pacing Threshold Amplitude: 0.625 V
Lead Channel Pacing Threshold Amplitude: 1.25 V
Lead Channel Pacing Threshold Pulse Width: 0.4 ms
Lead Channel Pacing Threshold Pulse Width: 0.4 ms
Lead Channel Setting Pacing Amplitude: 2 V
Lead Channel Setting Pacing Amplitude: 2.5 V
Lead Channel Setting Pacing Pulse Width: 0.4 ms
Lead Channel Setting Sensing Sensitivity: 2.8 mV
Zone Setting Status: 755011
Zone Setting Status: 755011

## 2022-09-09 MED ORDER — MELOXICAM 7.5 MG PO TABS: 7.5000 mg | ORAL_TABLET | Freq: Every day | ORAL | 0 refills | Status: DC

## 2022-09-09 NOTE — Telephone Encounter (Signed)
Called pharmacy and gave verbal order for meloxicam 7.5 MG 60 with no refills. Pt aware and verbalized understanding.  KP

## 2022-09-09 NOTE — Telephone Encounter (Signed)
Patient is requesting her prescription of meloxicam (MOBIC) 7.5 MG tablet [id number] to be sent to the [facility] on [address], [place], [place] [address]. Patient says she received a MyChart message telling her to call in and inform what pharmacy she wanted the medication to go to.

## 2022-09-10 NOTE — Progress Notes (Signed)
     Primary Care / Sports Medicine Office Visit  Patient Information:  Patient ID: Wendy Kelly, female DOB: 1956/07/06 Age: 67 y.o. 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 female presenting with the following:  Chief Complaint  Patient presents with   Knee Pain    Since Thanksgiving, night is worst, right knee, got up quick from table to help mother and started feeling some pain as she was walking up stairs.     Vitals:   08/23/22 0854  BP: 118/78  Pulse: 76  SpO2: 98%   Vitals:   08/23/22 0854  Weight: 155 lb (70.3 kg)  Height: '5\' 3"'$  (1.6 m)   Body mass index is 27.46 kg/m.     Independent interpretation of notes and tests performed by another provider:   None  Procedures performed:   None  Pertinent History, Exam, Impression, and Recommendations:   Wendy Kelly was seen today for knee pain.  Patellofemoral arthralgia of right knee Assessment & Plan: Acute on chronic condition involving right knee anteriorly with findings most consistent with patellofemoral arthralgia in the setting of sudden overuse. Plan for meloxicam regimen and close follow-up.   Right lumbosacral radiculopathy Assessment & Plan: Following acute on chronic onset of right knee pain anteriorly. Began to note progressive symptoms along right leg, exam with modified SLR positive. Plan for lumbar spine x-rays, will be placed on meloxicam, close follow-up.  Orders: -     DG Lumbar Spine Complete; Future     Orders & Medications Meds ordered this encounter  Medications   DISCONTD: meloxicam (MOBIC) 7.5 MG tablet    Sig: Take 1 tablet (7.5 mg total) by mouth daily.    Dispense:  45 tablet    Refill:  0   Orders Placed This Encounter  Procedures   DG Lumbar Spine Complete     No follow-ups on file.     Montel Culver, MD, [facility]   Primary Care Sports Medicine Primary Care and Sports Medicine at [facility]

## 2022-09-21 ENCOUNTER — Other Ambulatory Visit: Payer: Self-pay | Admitting: Family Medicine

## 2022-09-21 DIAGNOSIS — M5417 Radiculopathy, lumbosacral region: Secondary | ICD-10-CM

## 2022-09-21 DIAGNOSIS — M25561 Pain in right knee: Secondary | ICD-10-CM

## 2022-09-22 NOTE — Telephone Encounter (Signed)
Refused Mobic 7.5 mg because it's a duplicate order.

## 2022-10-04 ENCOUNTER — Ambulatory Visit (INDEPENDENT_AMBULATORY_CARE_PROVIDER_SITE_OTHER): Payer: PPO | Admitting: Family Medicine

## 2022-10-04 ENCOUNTER — Encounter: Payer: Self-pay | Admitting: Family Medicine

## 2022-10-04 VITALS — BP 110/78 | HR 78 | Ht 63.0 in | Wt 153.0 lb

## 2022-10-04 DIAGNOSIS — M5417 Radiculopathy, lumbosacral region: Secondary | ICD-10-CM | POA: Diagnosis not present

## 2022-10-04 MED ORDER — BACLOFEN 5 MG PO TABS: 2.5000 mg | ORAL_TABLET | Freq: Every evening | ORAL | 0 refills | Status: DC | PRN

## 2022-10-04 NOTE — Progress Notes (Signed)
     Primary Care / Sports Medicine Office Visit  Patient Information:  Patient ID: Wendy Kelly, female DOB: August 30, 1955 Age: 67 y.o. MRN: [id number]   Wendy Kelly is a pleasant 67 y.o. female presenting with the following:  Chief Complaint  Patient presents with   Patellofemoral arthralgia of right knee    More tiba to ankle    Vitals:   10/04/22 0919  BP: 110/78  Pulse: 78  SpO2: 98%   Vitals:   10/04/22 0919  Weight: 153 lb (69.4 kg)  Height: 5\' 3"  (1.6 m)   Body mass index is 27.1 kg/m.     Independent interpretation of notes and tests performed by another provider:   Independent interpretation of lumbar spine x-rays reveals intervertebral space loss at L5-S1, subtle associated foraminal narrowing, facet mild hypertrophy, additionally at L2-3 there is anterior endplate osteophytes with minimal intervertebral space loss, no acute osseous processes noted.  Procedures performed:   None  Pertinent History, Exam, Impression, and Recommendations:   Wendy Kelly was seen today for patellofemoral arthralgia of right knee.  Right lumbosacral radiculopathy Assessment & Plan: Patient presents for follow-up to acute on chronic right lower leg symptoms, concern for lumbosacral etiology with secondary knee involvement.  At last visit on 09/02/2022 obtained lumbar spine x-rays, placed on meloxicam's, started home exercises.  Patient was able to perform exercises on a regular basis, could briefly tolerate meloxicam but had to ultimately discontinued due to GI adverse effects, overall has noted steady improvement though not resolution of her primarily nighttime right lower leg symptoms.  X-rays were reviewed with patient, given these findings and her overall clinical course, reviewed treatment strategies and she is amenable to baclofen 2.5-5 mg nightly as needed, updated exercises for the low back, can consider further escalation with trial of gabapentin, formal PT, and/or referral to spine  group.  Patient was made aware to contact us if wishing to proceed with any of these options.  She can otherwise follow-up as needed.  Orders: -     Baclofen; Take 0.5-1 tablets (2.5-5 mg total) by mouth at bedtime as needed for muscle spasms.  Dispense: 30 tablet; Refill: 0     Orders & Medications Meds ordered this encounter  Medications   baclofen 5 MG TABS    Sig: Take 0.5-1 tablets (2.5-5 mg total) by mouth at bedtime as needed for muscle spasms.    Dispense:  30 tablet    Refill:  0   No orders of the defined types were placed in this encounter.    Return if symptoms worsen or fail to improve.     Montel Culver, MD, [facility]   Primary Care Sports Medicine Primary Care and Sports Medicine at [facility]

## 2022-10-04 NOTE — Assessment & Plan Note (Signed)
Patient presents for follow-up to acute on chronic right lower leg symptoms, concern for lumbosacral etiology with secondary knee involvement.  At last visit on 09/02/2022 obtained lumbar spine x-rays, placed on meloxicam's, started home exercises.  Patient was able to perform exercises on a regular basis, could briefly tolerate meloxicam but had to ultimately discontinued due to GI adverse effects, overall has noted steady improvement though not resolution of her primarily nighttime right lower leg symptoms.  X-rays were reviewed with patient, given these findings and her overall clinical course, reviewed treatment strategies and she is amenable to baclofen 2.5-5 mg nightly as needed, updated exercises for the low back, can consider further escalation with trial of gabapentin, formal PT, and/or referral to spine group.  Patient was made aware to contact us if wishing to proceed with any of these options.  She can otherwise follow-up as needed.

## 2022-10-04 NOTE — Patient Instructions (Signed)
-   Perform updated low back exercises on a regular basis until symptoms rain then - Once symptoms controlled can transition to "maintenance rehab " - Can trial 1/2-1 tablet baclofen nightly on an as-needed basis - Contact our office if needing further medication management and/or referral to spine group - Follow-up as needed

## 2022-10-13 NOTE — Progress Notes (Signed)
Remote pacemaker transmission.   

## 2022-10-18 ENCOUNTER — Other Ambulatory Visit: Payer: Self-pay | Admitting: Family Medicine

## 2022-10-18 DIAGNOSIS — M5416 Radiculopathy, lumbar region: Secondary | ICD-10-CM | POA: Diagnosis not present

## 2022-10-18 DIAGNOSIS — M5136 Other intervertebral disc degeneration, lumbar region: Secondary | ICD-10-CM | POA: Diagnosis not present

## 2022-10-22 ENCOUNTER — Ambulatory Visit
Admission: RE | Admit: 2022-10-22 | Discharge: 2022-10-22 | Disposition: A | Discharge status: Home / Self Care | Payer: PPO | Source: Ambulatory Visit | Attending: Family Medicine | Admitting: Family Medicine

## 2022-10-22 DIAGNOSIS — M545 Low back pain, unspecified: Secondary | ICD-10-CM | POA: Diagnosis not present

## 2022-10-22 DIAGNOSIS — M5416 Radiculopathy, lumbar region: Secondary | ICD-10-CM

## 2022-10-26 DIAGNOSIS — M545 Low back pain, unspecified: Secondary | ICD-10-CM | POA: Diagnosis not present

## 2022-10-29 DIAGNOSIS — M5416 Radiculopathy, lumbar region: Secondary | ICD-10-CM | POA: Diagnosis not present

## 2022-10-29 DIAGNOSIS — M5136 Other intervertebral disc degeneration, lumbar region: Secondary | ICD-10-CM | POA: Diagnosis not present

## 2022-11-02 DIAGNOSIS — M545 Low back pain, unspecified: Secondary | ICD-10-CM | POA: Diagnosis not present

## 2022-11-10 DIAGNOSIS — M545 Low back pain, unspecified: Secondary | ICD-10-CM | POA: Diagnosis not present

## 2022-11-11 DIAGNOSIS — M5417 Radiculopathy, lumbosacral region: Secondary | ICD-10-CM | POA: Diagnosis not present

## 2022-11-16 DIAGNOSIS — M545 Low back pain, unspecified: Secondary | ICD-10-CM | POA: Diagnosis not present

## 2022-11-20 ENCOUNTER — Other Ambulatory Visit: Payer: Self-pay | Admitting: Family Medicine

## 2022-11-22 ENCOUNTER — Ambulatory Visit: Payer: Self-pay | Admitting: *Deleted

## 2022-11-22 ENCOUNTER — Encounter: Payer: Self-pay | Admitting: Nurse Practitioner

## 2022-11-22 ENCOUNTER — Ambulatory Visit (INDEPENDENT_AMBULATORY_CARE_PROVIDER_SITE_OTHER): Payer: PPO | Admitting: Nurse Practitioner

## 2022-11-22 VITALS — BP 102/68 | HR 85 | Temp 98.5°F | Wt 152.1 lb

## 2022-11-22 DIAGNOSIS — R8281 Pyuria: Secondary | ICD-10-CM

## 2022-11-22 DIAGNOSIS — N3001 Acute cystitis with hematuria: Secondary | ICD-10-CM | POA: Diagnosis not present

## 2022-11-22 DIAGNOSIS — R109 Unspecified abdominal pain: Secondary | ICD-10-CM | POA: Diagnosis not present

## 2022-11-22 LAB — URINALYSIS, ROUTINE W REFLEX MICROSCOPIC
Bilirubin, UA: NEGATIVE
Glucose, UA: NEGATIVE
Ketones, UA: NEGATIVE
Nitrite, UA: POSITIVE — AB
Specific Gravity, UA: 1.025 (ref 1.005–1.030)
Urobilinogen, Ur: 1 mg/dL (ref 0.2–1.0)
pH, UA: 5.5 (ref 5.0–7.5)

## 2022-11-22 LAB — MICROSCOPIC EXAMINATION

## 2022-11-22 MED ORDER — NITROFURANTOIN MONOHYD MACRO 100 MG PO CAPS: 100.0000 mg | ORAL_CAPSULE | Freq: Two times a day (BID) | ORAL | 0 refills | Status: DC

## 2022-11-22 NOTE — Telephone Encounter (Signed)
  Chief Complaint: Left flank pain, blood when she wipes after urinating, hurts in mid lower abd at the end of urinating. Symptoms: above Frequency: the flank pain woke her up at 2:00 this morning severe pain.   Blood noted this morning Pertinent Negatives: Patient denies kidney stone history but has had UTIs in past. Disposition: [] ED /[] Urgent Care (no appt availability in office) / [x] Appointment(In office/virtual)/ []  [facility]/ [] Home Care/ [] Refused Recommended Disposition /[] [facility]/ []  Follow-up with PCP Additional Notes: Appt made for today with Wendy Dials, NP for 9:20.  She also asked me to check to see if her Synthroid rx had come through.   She called her pharmacy on Friday.   I checked and it is pending approval.

## 2022-11-22 NOTE — Assessment & Plan Note (Signed)
Acute over past few days.  UA noting +NIT, 3+ BLD, 3+ LEUKS, many bacteria.  Will send for culture.  Is allergic to multiple antibiotics and has concerns about tendon rupture and Cipro.  Will trial Macrobid 100 MG BID for 5 days (discussed BEERS with her), if side effects she is to alert provider and will change to Cipro as options for oral are limited related to allergies.  Send for culture and adjust plan of care as needed.  Would benefit from return in 2 weeks to recheck urine.

## 2022-11-22 NOTE — Telephone Encounter (Signed)
Requested Prescriptions  Pending Prescriptions Disp Refills   levothyroxine (SYNTHROID) 75 MCG tablet [Pharmacy Med Name: LEVOTHYROXINE SODIUM 75 MCG TAB] 90 tablet 1    Sig: TAKE 1 TABLET BY MOUTHY DAILY     Endocrinology:  Hypothyroid Agents Passed - 11/20/2022  1:57 PM      Passed - TSH in normal range and within 360 days    TSH  Date Value Ref Range Status  05/04/2022 3.910 0.450 - 4.500 uIU/mL Final         Passed - Valid encounter within last 12 months    Recent Outpatient Visits           Today Acute cystitis with hematuria   [facility] [place], Corrie Dandy T, NP   1 month ago Right lumbosacral radiculopathy   [facility], Ocie Bob, MD   3 months ago Patellofemoral arthralgia of right knee   [facility], Ocie Bob, MD   3 months ago Urinary symptom or sign   [facility] Aura Dials T, NP   6 months ago Routine general medical examination at a health care facility   [facility] Dorcas Carrow, DO       Future Appointments             In 2 weeks Dorcas Carrow, DO [facility]   In 5 months [facility] Benes, Oralia Rud, DO [facility]

## 2022-11-22 NOTE — Patient Instructions (Signed)
Urinary Tract Infection, Adult A urinary tract infection (UTI) is an infection of any part of the urinary tract. The urinary tract includes: The kidneys. The ureters. The bladder. The urethra. These organs make, store, and get rid of pee (urine) in the body. What are the causes? This infection is caused by germs (bacteria) in your genital area. These germs grow and cause swelling (inflammation) of your urinary tract. What increases the risk? The following factors may make you more likely to develop this condition: Using a small, thin tube (catheter) to drain pee. Not being able to control when you pee or poop (incontinence). Being female. If you are female, these things can increase the risk: Using these methods to prevent pregnancy: A medicine that kills sperm (spermicide). A device that blocks sperm (diaphragm). Having low levels of a female hormone (estrogen). Being pregnant. You are more likely to develop this condition if: You have genes that add to your risk. You are sexually active. You take antibiotic medicines. You have trouble peeing because of: A prostate that is bigger than normal, if you are female. A blockage in the part of your body that drains pee from the bladder. A kidney stone. A nerve condition that affects your bladder. Not getting enough to drink. Not peeing often enough. You have other conditions, such as: Diabetes. A weak disease-fighting system (immune system). Sickle cell disease. Gout. Injury of the spine. What are the signs or symptoms? Symptoms of this condition include: Needing to pee right away. Peeing small amounts often. Pain or burning when peeing. Blood in the pee. Pee that smells bad or not like normal. Trouble peeing. Pee that is cloudy. Fluid coming from the vagina, if you are female. Pain in the belly or lower back. Other symptoms include: Vomiting. Not feeling hungry. Feeling mixed up (confused). This may be the first symptom in  older adults. Being tired and grouchy (irritable). A fever. Watery poop (diarrhea). How is this treated? Taking antibiotic medicine. Taking other medicines. Drinking enough water. In some cases, you may need to see a specialist. Follow these instructions at home:  Medicines Take over-the-counter and prescription medicines only as told by your doctor. If you were prescribed an antibiotic medicine, take it as told by your doctor. Do not stop taking it even if you start to feel better. General instructions Make sure you: Pee until your bladder is empty. Do not hold pee for a long time. Empty your bladder after sex. Wipe from front to back after peeing or pooping if you are a female. Use each tissue one time when you wipe. Drink enough fluid to keep your pee pale yellow. Keep all follow-up visits. Contact a doctor if: You do not get better after 1-2 days. Your symptoms go away and then come back. Get help right away if: You have very bad back pain. You have very bad pain in your lower belly. You have a fever. You have chills. You feeling like you will vomit or you vomit. Summary A urinary tract infection (UTI) is an infection of any part of the urinary tract. This condition is caused by germs in your genital area. There are many risk factors for a UTI. Treatment includes antibiotic medicines. Drink enough fluid to keep your pee pale yellow. This information is not intended to replace advice given to you by your health care provider. Make sure you discuss any questions you have with your health care provider. Document Revised: 02/08/2020 Document Reviewed: 02/08/2020 Elsevier Patient Education    2023 Elsevier Inc.  

## 2022-11-22 NOTE — Progress Notes (Signed)
BP 102/68   Pulse 85   Temp 98.5 F (36.9 C) (Oral)   Wt 152 lb 1.6 oz (69 kg)   BMI 26.94 kg/m    Subjective:    Patient ID: Wendy Kelly, female    DOB: [birth year], 67 y.o.   MRN: [id number]  HPI: Wendy Kelly is a 67 y.o. female  Chief Complaint  Patient presents with   Urinary Tract Infection    Pt states she has been having pain with urination since Friday night. States she feels the pain more at the end of her urination. States she started noticing some blood and cloudy urine into Saturday night. States she has been taking Azo, states this helped with the urgency but still experienced the pain and burning. States she started having low back pain yesterday and pain in her lower abdomen. States she has taken dual action tylenol to help with the pain.    URINARY SYMPTOMS Started with symptoms on Friday night, has been using essential oils -- but not improving to 100%.   Dysuria: yes - hurts, notices it most when getting ready to stop Urinary frequency: yes Urgency: yes Small volume voids: yes Symptom severity: no Urinary incontinence: no Foul odor: no Hematuria: yes Abdominal pain: yes Back pain: yes Suprapubic pain/pressure: yes Flank pain:  left side last night Fever:  no Vomiting: no Relief with pyridium:  helps with urgency Status: stable Previous urinary tract infection: yes Recurrent urinary tract infection: no Sexual activity: monogamous History of sexually transmitted disease: no Treatments attempted: antibiotics and increasing fluids and essential oils    Relevant past medical, surgical, family and social history reviewed and updated as indicated. Interim medical history since our last visit reviewed. Allergies and medications reviewed and updated.  Review of Systems  Constitutional:  Negative for activity change, appetite change, chills, fatigue and fever.  Respiratory:  Negative for cough, chest tightness, shortness of breath and wheezing.    Cardiovascular:  Negative for chest pain, palpitations and leg swelling.  Gastrointestinal:  Positive for abdominal pain. Negative for nausea and vomiting.  Genitourinary:  Positive for decreased urine volume, dysuria, flank pain, frequency, hematuria and urgency.  Musculoskeletal:  Positive for back pain.  Neurological: Negative.   Psychiatric/Behavioral: Negative.      Per HPI unless specifically indicated above     Objective:    BP 102/68   Pulse 85   Temp 98.5 F (36.9 C) (Oral)   Wt 152 lb 1.6 oz (69 kg)   BMI 26.94 kg/m   Wt Readings from Last 3 Encounters:  11/22/22 152 lb 1.6 oz (69 kg)  10/04/22 153 lb (69.4 kg)  08/23/22 155 lb (70.3 kg)    Physical Exam Vitals and nursing note reviewed.  Constitutional:      General: She is awake. She is not in acute distress.    Appearance: She is well-developed and well-groomed. She is not ill-appearing or toxic-appearing.  HENT:     Head: Normocephalic.     Right Ear: Hearing and external ear normal.     Left Ear: Hearing and external ear normal.  Eyes:     General: Lids are normal.        Right eye: No discharge.        Left eye: No discharge.     Conjunctiva/sclera: Conjunctivae normal.     Pupils: Pupils are equal, round, and reactive to light.  Neck:     Thyroid: No thyromegaly.     Vascular: No carotid  bruit.  Cardiovascular:     Rate and Rhythm: Normal rate and regular rhythm.     Heart sounds: Normal heart sounds. No murmur heard.    No gallop.  Pulmonary:     Effort: Pulmonary effort is normal. No accessory muscle usage or respiratory distress.     Breath sounds: Normal breath sounds.  Abdominal:     General: Bowel sounds are normal. There is no distension.     Palpations: Abdomen is soft.     Tenderness: There is generalized abdominal tenderness. There is no right CVA tenderness or left CVA tenderness.  Musculoskeletal:     Cervical back: Normal range of motion and neck supple.     Right lower leg: No  edema.     Left lower leg: No edema.  Lymphadenopathy:     Cervical: No cervical adenopathy.  Skin:    General: Skin is warm and dry.  Neurological:     Mental Status: She is alert and oriented to person, place, and time.  Psychiatric:        Attention and Perception: Attention normal.        Mood and Affect: Mood normal.        Speech: Speech normal.        Behavior: Behavior normal. Behavior is cooperative.        Thought Content: Thought content normal.     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    Implantable Lead Serial Number [id number]    Implantable Lead Implant Date [id number]    Implantable Lead Location [id number]    Implantable Lead Connection Status [id number]    Implantable Lead Manufacturer MERM    Implantable Lead Model 4592 CapSure SP Novus    Implantable Lead Serial Number [id number]    Implantable Lead Implant Date [id number]    Implantable Lead Location [id number]    Implantable Lead Connection Status [id number]    Lead Channel Setting Sensing Sensitivity 2.80 mV   Lead Channel Setting Pacing Amplitude 2.000 V   Lead Channel Setting Pacing Pulse Width 0.40 ms   Lead Channel Setting Pacing Amplitude 2.500 V   Zone Setting Status 755011    Zone Setting Status 755011    Lead Channel Impedance Value 565 ohm   Lead Channel Pacing Threshold Amplitude 0.625 V   Lead Channel Pacing Threshold Pulse Width 0.40 ms   Lead Channel Impedance Value 765 ohm   Lead Channel Pacing Threshold Amplitude 1.250 V   Lead Channel  Pacing Threshold Pulse Width 0.40 ms   Battery Status OK    Battery Remaining Longevity 111 mo   Battery Voltage 2.78 V   Battery Impedance 385 ohm   Brady Statistic AP VP Percent 0 %   Brady Statistic AS VP Percent 0 %   Brady Statistic AP VS Percent 17 %   Brady Statistic AS VS Percent 83 %      Assessment & Plan:   Problem List Items Addressed This Visit  Genitourinary   Acute cystitis with hematuria - Primary    Acute over past few days.  UA noting +NIT, 3+ BLD, 3+ LEUKS, many bacteria.  Will send for culture.  Is allergic to multiple antibiotics and has concerns about tendon rupture and Cipro.  Will trial Macrobid 100 MG BID for 5 days (discussed BEERS with her), if side effects she is to alert provider and will change to Cipro as options for oral are limited related to allergies.  Send for culture and adjust plan of care as needed.  Would benefit from return in 2 weeks to recheck urine.      Relevant Orders   Urinalysis, Routine w reflex microscopic   Other Visit Diagnoses     Pyuria       Urine sent for culture.   Relevant Orders   Urine Culture        Follow up plan: Return in about 2 weeks (around 12/06/2022) for UTI -- recheck .

## 2022-11-22 NOTE — Telephone Encounter (Signed)
Reason for Disposition  Urinating more frequently than usual (i.e., frequency)  Answer Assessment - Initial Assessment Questions 1. SYMPTOM: "What's the main symptom you're concerned about?" (e.g., frequency, incontinence)     I'm seeing blood when I wipe on the toilet paper after urinating.   2. ONSET: "When did the  bleeding  start?"     Friday night I've never had this happen when I have a UTI in the past. I use essential oils because I have so many allergies and they help me.   I used them Sat.   But Sat. Night  it started with pink color.   Sunday I felt better but not good.   2:00 AM I woke up hurting in my lower abd.   I started the AZO and it helped the pain.     3. PAIN: "Is there any pain?" If Yes, ask: "How bad is it?" (Scale: 1-10; mild, moderate, severe)     It hurts at the end of urinating.    4. CAUSE: "What do you think is causing the symptoms?"     UTI 5. OTHER SYMPTOMS: "Do you have any other symptoms?" (e.g., blood in urine, fever, flank pain, pain with urination)     I'm hurting in the lower front of my abd.   Blood on toilet paper when I wipe after urinating. 6. PREGNANCY: "Is there any chance you are pregnant?" "When was your last menstrual period?"     N/A due to age  Protocols used: Urinary Symptoms-A-AH

## 2022-11-24 ENCOUNTER — Encounter: Payer: Self-pay | Admitting: Nurse Practitioner

## 2022-11-24 LAB — URINE CULTURE

## 2022-11-24 NOTE — Progress Notes (Signed)
Contacted via MyChart   Good evening Wendy Kelly, your urine culture returned and it appears Macrobid is susceptible to what is growing, continue this until complete and follow-up as scheduled.  If any worsening or ongoing symptoms let us know:)

## 2022-11-26 ENCOUNTER — Ambulatory Visit: Payer: Self-pay

## 2022-11-26 MED ORDER — NITROFURANTOIN MONOHYD MACRO 100 MG PO CAPS: 100.0000 mg | ORAL_CAPSULE | Freq: Two times a day (BID) | ORAL | 0 refills | Status: AC

## 2022-11-26 MED ORDER — FLUCONAZOLE 150 MG PO TABS: 150.0000 mg | ORAL_TABLET | Freq: Once | ORAL | 0 refills | Status: AC

## 2022-11-26 NOTE — Telephone Encounter (Signed)
  Chief Complaint: UTI s/s not completely resolved. Symptoms: Discomfort with urination intermittently Frequency:  Pertinent Negatives: Patient denies Fever, flank pain Disposition: [] ED /[] Urgent Care (no appt availability in office) / [] Appointment(In office/virtual)/ []  [place] [facility]/ [] Home Care/ [] Refused Recommended Disposition /[] [facility] [facility]/ [x]  Follow-up with PCP Additional Notes: Pt states that s/s of UTI are much improved , but not completely resolved. Pt states that she has some discomfort with urination sometimes. Pt is unsure if she needs additional ABX. Pt would like to remind provider that she is leaving for [place]. Pt also states that she is starting to have vaginal itching and thinks it may be the start of a yeast infection. Please advise.    Summary: Pt still having pain during urination and requests to speak with a nurse   Pt stated she saw Jolene and was prescribed medication for uti but she is still having pain during urination and she is about to complete the medication. Pt concerned because she is suppose to be going out of town. Pt requests that a nurse return her call. Cb# [phone]       Answer Assessment - Initial Assessment Questions 1. MAIN SYMPTOM: "What is the main symptom you are concerned about?" (e.g., painful urination, urine frequency)     Still has pain with urination 2. BETTER-SAME-WORSE: "Are you getting better, staying the same, or getting worse compared to how you felt at your last visit to the doctor (most recent medical visit)?"     Better 3. PAIN: "How bad is the pain?"  (e.g., Scale 1-10; mild, moderate, or severe)   - MILD (1-3): complains slightly about urination hurting   - MODERATE (4-7): interferes with normal activities     - SEVERE (8-10): excruciating, unwilling or unable to urinate because of the pain      Mild - moderate - uncomfortable 1/10 4. FEVER: "Do you have a fever?" If Yes, ask: "What is it, how was it  measured, and when did it start?"     no 5. OTHER SYMPTOMS: "Do you have any other symptoms?" (e.g., blood in the urine, flank pain, vaginal discharge)     Back pain is gone 6. DIAGNOSIS: "When was the UTI diagnosed?" "By whom?" "Was it a kidney infection, bladder infection or both?"     Jolene 7. ANTIBIOTIC: "What antibiotic(s) are you taking?" "How many times per day?"     Macrobid 8. ANTIBIOTIC - START DATE: "When did you start taking the antibiotic?"     11/22/2022  Protocols used: Urinary Tract Infection on Antibiotic Follow-up Call - [facility]

## 2022-11-26 NOTE — Addendum Note (Signed)
Addended by: Aura Dials T on: 11/26/2022 03:33 PM   Modules accepted: Orders

## 2022-11-26 NOTE — Telephone Encounter (Signed)
Patient was notified and verbalized understanding and has no further questions.

## 2022-12-02 ENCOUNTER — Ambulatory Visit: Payer: Self-pay

## 2022-12-02 NOTE — Progress Notes (Signed)
Remote pacemaker transmission.   

## 2022-12-02 NOTE — Telephone Encounter (Signed)
  Chief Complaint: UTI FU Symptoms: vaginal burning, itching and pain Frequency: ongoing since 11/22/22 Pertinent Negatives: NA Disposition: [] ED /[] Urgent Care (no appt availability in office) / [x] Appointment(In office/virtual)/ []  [facility]/ [] Home Care/ [] Refused Recommended Disposition /[] [place] [facility]/ []  Follow-up with PCP Additional Notes: pt states she is still not feeling any better and still having above sx and will complete Macrobid tomorrow morning. Pt hasn't taken Diflucan yet because she was unsure if she needed it or not but just not feeling right in vaginal area and unsure what to do. Advised pt to go ahead and take Diflucan today and moved up appt for tomorrow at 0840 with Clydie Braun, NP to get her seen.   Summary: Pt stated she is still having problems from the uti and needs to speak with a nurse   Pt stated she is still having problems from the uti and needs to speak with a nurse. Pt requests that a nurse return her call. Cb# [phone]     Reason for Disposition  [1] Taking antibiotic > 72 hours (3 days) for UTI AND [2] painful urination or frequency is SAME (unchanged, not better)  Answer Assessment - Initial Assessment Questions 1. MAIN SYMPTOM: "What is the main symptom you are concerned about?" (e.g., painful urination, urine frequency)     Discomfort pain and itching and burning  2. BETTER-SAME-WORSE: "Are you getting better, staying the same, or getting worse compared to how you felt at your last visit to the doctor (most recent medical visit)?"     No improvement 3. PAIN: "How bad is the pain?"  (e.g., Scale 1-10; mild, moderate, or severe)   - MILD (1-3): complains slightly about urination hurting   - MODERATE (4-7): interferes with normal activities     - SEVERE (8-10): excruciating, unwilling or unable to urinate because of the pain      moderate 5. OTHER SYMPTOMS: "Do you have any other symptoms?" (e.g., blood in the urine, flank pain,  vaginal discharge)     Vaginal itching, burning and pain 6. DIAGNOSIS: "When was the UTI diagnosed?" "By whom?" "Was it a kidney infection, bladder infection or both?"     UTI, Jolene, NP 7. ANTIBIOTIC: "What antibiotic(s) are you taking?" "How many times per day?"     Macrobid BID  8. ANTIBIOTIC - START DATE: "When did you start taking the antibiotic?"     11/22/22  Protocols used: Urinary Tract Infection on Antibiotic Follow-up Call - [facility]

## 2022-12-03 ENCOUNTER — Encounter: Payer: Self-pay | Admitting: Nurse Practitioner

## 2022-12-03 ENCOUNTER — Ambulatory Visit (INDEPENDENT_AMBULATORY_CARE_PROVIDER_SITE_OTHER): Payer: PPO | Admitting: Nurse Practitioner

## 2022-12-03 VITALS — BP 108/69 | HR 61 | Temp 97.8°F | Wt 150.6 lb

## 2022-12-03 DIAGNOSIS — N3001 Acute cystitis with hematuria: Secondary | ICD-10-CM | POA: Diagnosis not present

## 2022-12-03 DIAGNOSIS — M545 Low back pain, unspecified: Secondary | ICD-10-CM | POA: Diagnosis not present

## 2022-12-03 LAB — WET PREP FOR TRICH, YEAST, CLUE
Clue Cell Exam: NEGATIVE
Trichomonas Exam: NEGATIVE
Yeast Exam: NEGATIVE

## 2022-12-03 LAB — URINALYSIS, ROUTINE W REFLEX MICROSCOPIC
Bilirubin, UA: NEGATIVE
Glucose, UA: NEGATIVE
Ketones, UA: NEGATIVE
Leukocytes,UA: NEGATIVE
Nitrite, UA: NEGATIVE
Protein,UA: NEGATIVE
RBC, UA: NEGATIVE
Specific Gravity, UA: 1.01 (ref 1.005–1.030)
Urobilinogen, Ur: 0.2 mg/dL (ref 0.2–1.0)
pH, UA: 6 (ref 5.0–7.5)

## 2022-12-03 MED ORDER — FLUCONAZOLE 150 MG PO TABS: 150.0000 mg | ORAL_TABLET | Freq: Once | ORAL | 0 refills | Status: AC

## 2022-12-03 NOTE — Progress Notes (Signed)
Results discussed with patient during visit.

## 2022-12-03 NOTE — Assessment & Plan Note (Signed)
UA unremarkable in office today.  Wet prep negative.  Patient did take Diflucan yesterday and symptoms have improved.  Wet Prep neg in office.  Suspect patient had a yeast infection after UTI which resolved.  Due to holiday weekend will send in 1 more dose of Diflucan in case symptoms return.  UA sent for culture.

## 2022-12-03 NOTE — Progress Notes (Signed)
BP 108/69   Pulse 61   Temp 97.8 F (36.6 C) (Oral)   Wt 150 lb 9.6 oz (68.3 kg)   SpO2 96%   BMI 26.68 kg/m    Subjective:    Patient ID: Wendy Kelly, female    DOB: August 24, 1955, 67 y.o.   MRN: [id number]  HPI: Wendy Kelly is a 67 y.o. female  Chief Complaint  Patient presents with   Urinary Tract Infection    Pt states she thinks she may still have a UTI. States she feels like she has spasms in her vaginal area.    URINARY SYMPTOMS Patient states she was seen about 10 days ago and treated for a UTI.  Patient states she feels like her symptoms are back and she is leaving for [place].  She was called in an additional 3 days of macrobid.  Patient states she states she can't tell whether it is vaginal or UTI related.  Pain is more in the vagina. Dysuria:  no Urinary frequency: yes Urgency: yes Small volume voids: yes Symptom severity: no Urinary incontinence: no Foul odor: no Hematuria: no Abdominal pain: no Back pain: no Suprapubic pain/pressure: yes Flank pain: no Fever:  no Vomiting: no Relief with cranberry juice: no Relief with pyridium: no Status: stable Previous urinary tract infection: yes Recurrent urinary tract infection: no Sexual activity: No sexually active/monogomous/practicing safe sex History of sexually transmitted disease: no Penile discharge: no Treatments attempted: antibiotics   Relevant past medical, surgical, family and social history reviewed and updated as indicated. Interim medical history since our last visit reviewed. Allergies and medications reviewed and updated.  Review of Systems  Constitutional:  Negative for fever.  Gastrointestinal:  Negative for abdominal pain and vomiting.  Genitourinary:  Positive for frequency and vaginal pain. Negative for decreased urine volume, dysuria, flank pain, hematuria and urgency.  Musculoskeletal:  Negative for back pain.    Per HPI unless specifically indicated above     Objective:     BP 108/69   Pulse 61   Temp 97.8 F (36.6 C) (Oral)   Wt 150 lb 9.6 oz (68.3 kg)   SpO2 96%   BMI 26.68 kg/m   Wt Readings from Last 3 Encounters:  12/03/22 150 lb 9.6 oz (68.3 kg)  11/22/22 152 lb 1.6 oz (69 kg)  10/04/22 153 lb (69.4 kg)    Physical Exam Vitals and nursing note reviewed.  Constitutional:      General: She is not in acute distress.    Appearance: Normal appearance. She is normal weight. She is not ill-appearing, toxic-appearing or diaphoretic.  HENT:     Head: Normocephalic.     Right Ear: External ear normal.     Left Ear: External ear normal.     Nose: Nose normal.     Mouth/Throat:     Mouth: Mucous membranes are moist.     Pharynx: Oropharynx is clear.  Eyes:     General:        Right eye: No discharge.        Left eye: No discharge.     Extraocular Movements: Extraocular movements intact.     Conjunctiva/sclera: Conjunctivae normal.     Pupils: Pupils are equal, round, and reactive to light.  Cardiovascular:     Rate and Rhythm: Normal rate and regular rhythm.     Heart sounds: No murmur heard. Pulmonary:     Effort: Pulmonary effort is normal. No respiratory distress.     Breath sounds:  Normal breath sounds. No wheezing or rales.  Abdominal:     General: Abdomen is flat. Bowel sounds are normal. There is no distension.     Palpations: Abdomen is soft.     Tenderness: There is no abdominal tenderness. There is no right CVA tenderness, left CVA tenderness or guarding.  Musculoskeletal:     Cervical back: Normal range of motion and neck supple.  Skin:    General: Skin is warm and dry.     Capillary Refill: Capillary refill takes less than 2 seconds.  Neurological:     General: No focal deficit present.     Mental Status: She is alert and oriented to person, place, and time. Mental status is at baseline.  Psychiatric:        Mood and Affect: Mood normal.        Behavior: Behavior normal.        Thought Content: Thought content normal.         Judgment: Judgment normal.     Results for orders placed or performed in visit on 11/22/22  Urine Culture   Specimen: Urine   UR  Result Value Ref Range   Urine Culture, Routine Final report (A)    Organism ID, Bacteria Escherichia coli (A)    Antimicrobial Susceptibility Comment   Microscopic Examination   Urine  Result Value Ref Range   WBC, UA >30W 0 - 5 /hpf   RBC, Urine 11-30 (A) 0 - 2 /hpf   Epithelial Cells (non renal) 0-10 0 - 10 /hpf   Bacteria, UA Many (A) None seen/Few  Urinalysis, Routine w reflex microscopic  Result Value Ref Range   Specific Gravity, UA 1.025 1.005 - 1.030   pH, UA 5.5 5.0 - 7.5   Color, UA Yellow Yellow   Appearance Ur Turbid (A) Clear   Leukocytes,UA 3+ (A) Negative   Protein,UA 2+ (A) Negative/Trace   Glucose, UA Negative Negative   Ketones, UA Negative Negative   RBC, UA 3+ (A) Negative   Bilirubin, UA Negative Negative   Urobilinogen, Ur 1.0 0.2 - 1.0 mg/dL   Nitrite, UA Positive (A) Negative   Microscopic Examination See below:       Assessment & Plan:   Problem List Items Addressed This Visit       Genitourinary   Acute cystitis with hematuria - Primary    UA unremarkable in office today.  Wet prep negative.  Patient did take Diflucan yesterday and symptoms have improved.  Wet Prep neg in office.  Suspect patient had a yeast infection after UTI which resolved.  Due to holiday weekend will send in 1 more dose of Diflucan in case symptoms return.  UA sent for culture.      Relevant Orders   Urinalysis, Routine w reflex microscopic   WET PREP FOR TRICH, YEAST, CLUE   Urine Culture     Follow up plan: Return if symptoms worsen or fail to improve.

## 2022-12-06 LAB — URINE CULTURE

## 2022-12-07 ENCOUNTER — Ambulatory Visit (INDEPENDENT_AMBULATORY_CARE_PROVIDER_SITE_OTHER): Payer: PPO

## 2022-12-07 ENCOUNTER — Ambulatory Visit: Payer: PPO | Admitting: Family Medicine

## 2022-12-07 DIAGNOSIS — I441 Atrioventricular block, second degree: Secondary | ICD-10-CM | POA: Diagnosis not present

## 2022-12-07 NOTE — Progress Notes (Signed)
Hi Wendy Kelly.  There was no growth on your urine culture.  You do not need further antibiotic treatment.

## 2022-12-08 LAB — CUP PACEART REMOTE DEVICE CHECK
Battery Impedance: 410 Ohm
Battery Remaining Longevity: 108 mo
Battery Voltage: 2.79 V
Brady Statistic AP VP Percent: 0 %
Brady Statistic AP VS Percent: 17 %
Brady Statistic AS VP Percent: 0 %
Brady Statistic AS VS Percent: 83 %
Date Time Interrogation Session: 20240528072626
Implantable Lead Connection Status: 753985
Implantable Lead Connection Status: 753985
Implantable Lead Implant Date: 20061128
Implantable Lead Implant Date: 20061128
Implantable Lead Location: 753859
Implantable Lead Location: 753860
Implantable Lead Model: 4092
Implantable Lead Model: 4592
Implantable Pulse Generator Implant Date: 20161123
Lead Channel Impedance Value: 692 Ohm
Lead Channel Impedance Value: 762 Ohm
Lead Channel Pacing Threshold Amplitude: 0.5 V
Lead Channel Pacing Threshold Amplitude: 1.125 V
Lead Channel Pacing Threshold Pulse Width: 0.4 ms
Lead Channel Pacing Threshold Pulse Width: 0.4 ms
Lead Channel Setting Pacing Amplitude: 2 V
Lead Channel Setting Pacing Amplitude: 2.5 V
Lead Channel Setting Pacing Pulse Width: 0.4 ms
Lead Channel Setting Sensing Sensitivity: 4 mV
Zone Setting Status: 755011
Zone Setting Status: 755011

## 2022-12-22 DIAGNOSIS — M545 Low back pain, unspecified: Secondary | ICD-10-CM | POA: Diagnosis not present

## 2023-01-03 NOTE — Progress Notes (Signed)
Remote pacemaker transmission.   

## 2023-01-04 DIAGNOSIS — Z961 Presence of intraocular lens: Secondary | ICD-10-CM | POA: Diagnosis not present

## 2023-01-10 DIAGNOSIS — Z85828 Personal history of other malignant neoplasm of skin: Secondary | ICD-10-CM | POA: Diagnosis not present

## 2023-01-10 DIAGNOSIS — D2271 Melanocytic nevi of right lower limb, including hip: Secondary | ICD-10-CM | POA: Diagnosis not present

## 2023-01-10 DIAGNOSIS — D2261 Melanocytic nevi of right upper limb, including shoulder: Secondary | ICD-10-CM | POA: Diagnosis not present

## 2023-01-10 DIAGNOSIS — D0439 Carcinoma in situ of skin of other parts of face: Secondary | ICD-10-CM | POA: Diagnosis not present

## 2023-01-10 DIAGNOSIS — D2262 Melanocytic nevi of left upper limb, including shoulder: Secondary | ICD-10-CM | POA: Diagnosis not present

## 2023-01-10 DIAGNOSIS — D485 Neoplasm of uncertain behavior of skin: Secondary | ICD-10-CM | POA: Diagnosis not present

## 2023-01-10 DIAGNOSIS — D225 Melanocytic nevi of trunk: Secondary | ICD-10-CM | POA: Diagnosis not present

## 2023-01-11 DIAGNOSIS — M545 Low back pain, unspecified: Secondary | ICD-10-CM | POA: Diagnosis not present

## 2023-01-25 ENCOUNTER — Observation Stay: Payer: PPO

## 2023-01-25 ENCOUNTER — Observation Stay: Admit: 2023-01-25 | Payer: PPO

## 2023-01-25 ENCOUNTER — Emergency Department: Payer: PPO

## 2023-01-25 ENCOUNTER — Encounter: Payer: Self-pay | Admitting: Emergency Medicine

## 2023-01-25 ENCOUNTER — Observation Stay
Admission: EM | Admit: 2023-01-25 | Discharge: 2023-01-26 | Disposition: A | Discharge status: Home / Self Care | Payer: PPO | Attending: Internal Medicine | Admitting: Internal Medicine

## 2023-01-25 ENCOUNTER — Other Ambulatory Visit: Payer: Self-pay

## 2023-01-25 DIAGNOSIS — R29818 Other symptoms and signs involving the nervous system: Secondary | ICD-10-CM | POA: Diagnosis not present

## 2023-01-25 DIAGNOSIS — R202 Paresthesia of skin: Secondary | ICD-10-CM

## 2023-01-25 DIAGNOSIS — R519 Headache, unspecified: Secondary | ICD-10-CM

## 2023-01-25 DIAGNOSIS — E039 Hypothyroidism, unspecified: Secondary | ICD-10-CM | POA: Insufficient documentation

## 2023-01-25 DIAGNOSIS — Z79899 Other long term (current) drug therapy: Secondary | ICD-10-CM | POA: Insufficient documentation

## 2023-01-25 DIAGNOSIS — Z85828 Personal history of other malignant neoplasm of skin: Secondary | ICD-10-CM | POA: Diagnosis not present

## 2023-01-25 DIAGNOSIS — I639 Cerebral infarction, unspecified: Secondary | ICD-10-CM

## 2023-01-25 DIAGNOSIS — H538 Other visual disturbances: Secondary | ICD-10-CM | POA: Diagnosis not present

## 2023-01-25 DIAGNOSIS — Z950 Presence of cardiac pacemaker: Secondary | ICD-10-CM | POA: Insufficient documentation

## 2023-01-25 DIAGNOSIS — H5710 Ocular pain, unspecified eye: Secondary | ICD-10-CM | POA: Diagnosis not present

## 2023-01-25 DIAGNOSIS — I4891 Unspecified atrial fibrillation: Secondary | ICD-10-CM | POA: Insufficient documentation

## 2023-01-25 DIAGNOSIS — I672 Cerebral atherosclerosis: Secondary | ICD-10-CM | POA: Diagnosis not present

## 2023-01-25 DIAGNOSIS — R29810 Facial weakness: Secondary | ICD-10-CM | POA: Diagnosis not present

## 2023-01-25 DIAGNOSIS — Z85818 Personal history of malignant neoplasm of other sites of lip, oral cavity, and pharynx: Secondary | ICD-10-CM | POA: Diagnosis not present

## 2023-01-25 DIAGNOSIS — Z8673 Personal history of transient ischemic attack (TIA), and cerebral infarction without residual deficits: Secondary | ICD-10-CM | POA: Diagnosis not present

## 2023-01-25 DIAGNOSIS — Z8616 Personal history of COVID-19: Secondary | ICD-10-CM | POA: Insufficient documentation

## 2023-01-25 DIAGNOSIS — G459 Transient cerebral ischemic attack, unspecified: Secondary | ICD-10-CM | POA: Diagnosis not present

## 2023-01-25 LAB — COMPREHENSIVE METABOLIC PANEL
ALT: 18 U/L (ref 0–44)
AST: 23 U/L (ref 15–41)
Albumin: 3.9 g/dL (ref 3.5–5.0)
Alkaline Phosphatase: 65 U/L (ref 38–126)
Anion gap: 8 (ref 5–15)
BUN: 20 mg/dL (ref 8–23)
CO2: 27 mmol/L (ref 22–32)
Calcium: 9.2 mg/dL (ref 8.9–10.3)
Chloride: 107 mmol/L (ref 98–111)
Creatinine, Ser: 0.65 mg/dL (ref 0.44–1.00)
GFR, Estimated: >60 mL/min (ref >60)
Glucose, Bld: 98 mg/dL (ref 70–99)
Potassium: 3.6 mmol/L (ref 3.5–5.1)
Sodium: 142 mmol/L (ref 135–145)
Total Bilirubin: 0.9 mg/dL (ref 0.3–1.2)
Total Protein: 6.9 g/dL (ref 6.5–8.1)

## 2023-01-25 LAB — PROTIME-INR
INR: 1 (ref 0.8–1.2)
Prothrombin Time: 13.3 seconds (ref 11.4–15.2)

## 2023-01-25 LAB — C-REACTIVE PROTEIN: CRP: <0.5 mg/dL (ref <1.0)

## 2023-01-25 LAB — ECHOCARDIOGRAM COMPLETE
AR max vel: 1.32 cm2
AV Area VTI: 1.67 cm2
AV Area mean vel: 1.38 cm2
AV Mean grad: 7 mmHg
AV Peak grad: 11.4 mmHg
Ao pk vel: 1.69 m/s
Area-P 1/2: 3.03 cm2
MV VTI: 2.15 cm2
S' Lateral: 2.8 cm
Weight: 2368 oz

## 2023-01-25 LAB — DIFFERENTIAL
Abs Immature Granulocytes: 0.01 10*3/uL (ref 0.00–0.07)
Basophils Absolute: 0 10*3/uL (ref 0.0–0.1)
Basophils Relative: 0 %
Eosinophils Absolute: 0.1 10*3/uL (ref 0.0–0.5)
Eosinophils Relative: 2 %
Immature Granulocytes: 0 %
Lymphocytes Relative: 15 %
Lymphs Abs: 0.7 10*3/uL (ref 0.7–4.0)
Monocytes Absolute: 0.3 10*3/uL (ref 0.1–1.0)
Monocytes Relative: 6 %
Neutro Abs: 3.6 10*3/uL (ref 1.7–7.7)
Neutrophils Relative %: 77 %

## 2023-01-25 LAB — URINE DRUG SCREEN, QUALITATIVE (ARMC ONLY)
Amphetamines, Ur Screen: NOT DETECTED
Barbiturates, Ur Screen: NOT DETECTED
Benzodiazepine, Ur Scrn: NOT DETECTED
Cannabinoid 50 Ng, Ur ~~LOC~~: NOT DETECTED
Cocaine Metabolite,Ur ~~LOC~~: NOT DETECTED
MDMA (Ecstasy)Ur Screen: NOT DETECTED
Methadone Scn, Ur: NOT DETECTED
Opiate, Ur Screen: NOT DETECTED
Phencyclidine (PCP) Ur S: NOT DETECTED
Tricyclic, Ur Screen: NOT DETECTED

## 2023-01-25 LAB — URINALYSIS, ROUTINE W REFLEX MICROSCOPIC
Bilirubin Urine: NEGATIVE
Glucose, UA: NEGATIVE mg/dL
Hgb urine dipstick: NEGATIVE
Ketones, ur: NEGATIVE mg/dL
Leukocytes,Ua: NEGATIVE
Nitrite: NEGATIVE
Protein, ur: NEGATIVE mg/dL
Specific Gravity, Urine: 1.017 (ref 1.005–1.030)
pH: 6 (ref 5.0–8.0)

## 2023-01-25 LAB — CBC
HCT: 43.2 % (ref 36.0–46.0)
Hemoglobin: 14.1 g/dL (ref 12.0–15.0)
MCH: 30.2 pg (ref 26.0–34.0)
MCHC: 32.6 g/dL (ref 30.0–36.0)
MCV: 92.5 fL (ref 80.0–100.0)
Platelets: 147 10*3/uL — ABNORMAL LOW (ref 150–400)
RBC: 4.67 MIL/uL (ref 3.87–5.11)
RDW: 12.8 % (ref 11.5–15.5)
WBC: 4.7 10*3/uL (ref 4.0–10.5)
nRBC: 0 % (ref 0.0–0.2)

## 2023-01-25 LAB — HEMOGLOBIN A1C
Hgb A1c MFr Bld: 5.3 % (ref 4.8–5.6)
Mean Plasma Glucose: 105.41 mg/dL

## 2023-01-25 LAB — APTT: aPTT: 28 seconds (ref 24–36)

## 2023-01-25 LAB — ETHANOL: Alcohol, Ethyl (B): <10 mg/dL (ref <10)

## 2023-01-25 LAB — CBG MONITORING, ED: Glucose-Capillary: 95 mg/dL (ref 70–99)

## 2023-01-25 LAB — SEDIMENTATION RATE: Sed Rate: 8 mm/hr (ref 0–30)

## 2023-01-25 MED ORDER — IOHEXOL 350 MG/ML SOLN
75.0000 mL | Freq: Once | INTRAVENOUS | Status: AC | PRN
  Administered 2023-01-25: 75 mL via INTRAVENOUS

## 2023-01-25 MED ORDER — ONDANSETRON HCL 4 MG/2ML IJ SOLN: 4.0000 mg | Freq: Three times a day (TID) | INTRAMUSCULAR | Status: DC | PRN

## 2023-01-25 MED ORDER — CRANBERRY SOFT 500 MG PO CHEW: 1.0000 | CHEWABLE_TABLET | Freq: Every day | ORAL | Status: DC

## 2023-01-25 MED ORDER — ACETAMINOPHEN 650 MG RE SUPP: 650.0000 mg | RECTAL | Status: DC | PRN

## 2023-01-25 MED ORDER — OYSTER SHELL CALCIUM/D3 500-5 MG-MCG PO TABS: 1.0000 | ORAL_TABLET | Freq: Every day | ORAL | Status: DC

## 2023-01-25 MED ORDER — ACETAMINOPHEN 325 MG PO TABS
650.0000 mg | ORAL_TABLET | Freq: Once | ORAL | Status: AC
  Administered 2023-01-25: 650 mg via ORAL
  Filled 2023-01-25: qty 2

## 2023-01-25 MED ORDER — ASPIRIN 81 MG PO TBEC: 81.0000 mg | DELAYED_RELEASE_TABLET | Freq: Every day | ORAL | Status: DC

## 2023-01-25 MED ORDER — B COMPLEX-C PO TABS
2.0000 | ORAL_TABLET | Freq: Every day | ORAL | Status: DC
  Filled 2023-01-25: qty 2

## 2023-01-25 MED ORDER — HYDRALAZINE HCL 20 MG/ML IJ SOLN: 5.0000 mg | INTRAMUSCULAR | Status: DC | PRN

## 2023-01-25 MED ORDER — LEVOTHYROXINE SODIUM 75 MCG PO TABS
75.0000 ug | ORAL_TABLET | Freq: Every day | ORAL | Status: DC
  Administered 2023-01-26: 75 ug via ORAL
  Filled 2023-01-25: qty 1
  Filled 2023-01-25: qty 3

## 2023-01-25 MED ORDER — ACETAMINOPHEN 325 MG PO TABS
650.0000 mg | ORAL_TABLET | ORAL | Status: DC | PRN
  Administered 2023-01-26: 650 mg via ORAL
  Filled 2023-01-25: qty 2

## 2023-01-25 MED ORDER — SENNOSIDES-DOCUSATE SODIUM 8.6-50 MG PO TABS: 1.0000 | ORAL_TABLET | Freq: Every evening | ORAL | Status: DC | PRN

## 2023-01-25 MED ORDER — CLOPIDOGREL BISULFATE 75 MG PO TABS
75.0000 mg | ORAL_TABLET | Freq: Every day | ORAL | Status: DC
  Administered 2023-01-25: 75 mg via ORAL
  Filled 2023-01-25: qty 1

## 2023-01-25 MED ORDER — DIGESTIVE ENZYME PO CAPS: 1.0000 | ORAL_CAPSULE | Freq: Every day | ORAL | Status: DC

## 2023-01-25 MED ORDER — ASPIRIN 325 MG PO TABS
325.0000 mg | ORAL_TABLET | Freq: Every day | ORAL | Status: DC
  Administered 2023-01-25: 325 mg via ORAL
  Filled 2023-01-25: qty 1

## 2023-01-25 MED ORDER — STROKE: EARLY STAGES OF RECOVERY BOOK: Freq: Once | Status: AC

## 2023-01-25 MED ORDER — ENOXAPARIN SODIUM 40 MG/0.4ML IJ SOSY
40.0000 mg | PREFILLED_SYRINGE | INTRAMUSCULAR | Status: DC
  Administered 2023-01-25: 40 mg via SUBCUTANEOUS
  Filled 2023-01-25: qty 0.4

## 2023-01-25 MED ORDER — ACETAMINOPHEN 160 MG/5ML PO SOLN: 650.0000 mg | ORAL | Status: DC | PRN

## 2023-01-25 MED ORDER — ASPIRIN 300 MG RE SUPP: 300.0000 mg | Freq: Every day | RECTAL | Status: DC

## 2023-01-25 MED ORDER — DIPHENHYDRAMINE HCL 50 MG/ML IJ SOLN: 12.5000 mg | Freq: Three times a day (TID) | INTRAMUSCULAR | Status: DC | PRN

## 2023-01-25 MED ORDER — ATORVASTATIN CALCIUM 20 MG PO TABS: 40.0000 mg | ORAL_TABLET | Freq: Every day | ORAL | Status: DC

## 2023-01-25 NOTE — Evaluation (Signed)
Occupational Therapy Evaluation Patient Details Name: Wendy Kelly MRN: [id number] DOB: 1956-01-23 Today's Date: 01/25/2023   History of Present Illness Per MD Notes: Wendy Kelly is a 67 y.o. female with a history of throat cancer who presents with complaints of a sensation of difficulty holding her right eye open and a pulling sensation in her right face.  CT head no acute process. TNK was not administered 2/2 sx being too mild to treat.   Clinical Impression   Wendy Kelly was seen for OT evaluation this date. Prior to hospital admission, pt was independent in all aspects of ADL/IADL, and denies falls history in past 12 months. Pt lives with her spouse and mother in a 1 story home with a ramped entrance. 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 Currently pt reporting symptoms have resolved. Pt demonstrates baseline independence to perform ADL and mobility tasks and no strength, sensory, coordination, cognitive, or visual deficits appreciated with assessment. No skilled OT needs identified. Will sign off. Please re-consult if additional OT needs arise.       Recommendations for follow up therapy are one component of a multi-disciplinary discharge planning process, led by the attending physician.  Recommendations may be updated based on patient status, additional functional criteria and insurance authorization.   Assistance Recommended at Discharge PRN  Patient can return home with the following      Functional Status Assessment  Patient has not had a recent decline in their functional status  Equipment Recommendations  None recommended by OT    Recommendations for Other Services       Precautions / Restrictions Precautions Precautions: Fall Precaution Comments: low fall Restrictions Weight Bearing Restrictions: No      Mobility Bed Mobility Overal bed mobility: Independent                  Transfers Overall  transfer level: Independent Equipment used: None               General transfer comment: Able to mobilize in room and hall without AE.      Balance Overall balance assessment: No apparent balance deficits (not formally assessed)                                         ADL either performed or assessed with clinical judgement   ADL Overall ADL's : At baseline                                       General ADL Comments: Pt presents at functional baseline for ADL management. She performs bed, functional mobility, toileting, and grooming independently. She is able to ambulate in the hall without a device. No overt LOB or instability appreciated.     Vision Baseline Vision/History: 1 Wears glasses Ability to See in Adequate Light: 1 Impaired Patient Visual Report: No change from baseline (Pt reports initial "fogginess" of vision, but this has since resolved.) Additional Comments: Of note, pt R eye lid does appear to droop as compared to L. Pt endorses it is still not at baseline, but is improved from previous date.     Perception     Praxis      Pertinent Vitals/Pain Pain Assessment Pain Assessment: No/denies pain  Hand Dominance     Extremity/Trunk Assessment Upper Extremity Assessment Upper Extremity Assessment: Overall WFL for tasks assessed (No focal weakness or sensory deficit. Pt reports numbness and tingling was only in her face/eye and has generally resolved.)   Lower Extremity Assessment Lower Extremity Assessment: Overall WFL for tasks assessed       Communication Communication Communication: No difficulties   Cognition Arousal/Alertness: Awake/alert Behavior During Therapy: WFL for tasks assessed/performed Overall Cognitive Status: Within Functional Limits for tasks assessed                                 General Comments: Pleasant, conversational, A&O x4     General Comments       Exercises Other  Exercises Other Exercises: Pt educated on role of OT in acute setting, DC recs, falls prevention strategies, and BE FAST stroke regognition and response education.   Shoulder Instructions      Home Living Family/patient expects to be discharged to:: Private residence Living Arrangements: Spouse/significant other;Parent (cares for 33 y.o. mother) Available Help at Discharge: Family;Available PRN/intermittently Type of Home: House Home Access: Ramped entrance     Home Layout: One level     Bathroom Shower/Tub: Producer, television/film/video: Handicapped height     Home Equipment: Grab bars - toilet;Grab bars - tub/shower   Additional Comments: Pt does not utilize any AE/DME at baseline but has had her house modified to be accessible for her 32 y.o. mother.      Prior Functioning/Environment Prior Level of Function : Independent/Modified Independent;Working/employed;Driving             Mobility Comments: Independent without AE, denies falls history. ADLs Comments: Active, independent with ADL/IADL management, works as Teacher, adult education. Works out regularly.        OT Problem List: Impaired sensation      OT Treatment/Interventions:      OT Goals(Current goals can be found in the care plan section) Acute Rehab OT Goals Patient Stated Goal: to feel better OT Goal Formulation: All assessment and education complete, DC therapy Time For Goal Achievement: 01/25/23 Potential to Achieve Goals: Good  OT Frequency:      Co-evaluation              AM-PAC OT "6 Clicks" Daily Activity     Outcome Measure Help from another person eating meals?: None Help from another person taking care of personal grooming?: None Help from another person toileting, which includes using toliet, bedpan, or urinal?: None Help from another person bathing (including washing, rinsing, drying)?: None Help from another person to put on and taking off regular upper body clothing?: None Help  from another person to put on and taking off regular lower body clothing?: None 6 Click Score: 24   End of Session    Activity Tolerance: Patient tolerated treatment well Patient left: in bed;with call bell/phone within reach  OT Visit Diagnosis: Other symptoms and signs involving the nervous system (R29.898)                Time: 1252-1310 OT Time Calculation (min): 18 min Charges:  OT General Charges $OT Visit: 1 Visit OT Evaluation $OT Eval Low Complexity: 1 Low OT Treatments $Self Care/Home Management : 8-22 mins  Rockney Ghee, M.S., OTR/L 01/25/23, 2:50 PM

## 2023-01-25 NOTE — Progress Notes (Signed)
1478 Stroke alert cart activation. [facility] paged by [facility]. [phone] Patient to CT department. 2130 Neurologist, Dr Bing Neighbors, meets patient in CT department for examination.

## 2023-01-25 NOTE — Code Documentation (Signed)
Stroke Response Nurse Documentation Code Documentation  Wendy Kelly is a 67 y.o. 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 On No antithrombotic. Code stroke was activated by ED.   Patient from home where she was LKW at 0630 and now complaining of right eye drooping. Patient explains she woke up at 0530 this morning in her normal state. Acute onset of right eye drooping and associated right eye pain while she was getting ready for the day as she usually does. She endorses intermittent lightheadedness and slight headache on right side of head that comes and goes.   Stroke team at the bedside on patient arrival. Labs drawn and patient cleared for CT by Dr. Cyril Loosen. Patient to CT with team. NIHSS 1, see documentation for details and code stroke times. Patient with right decreased sensation on right side of face on exam. The following imaging was completed:  CT Head. Patient is not a candidate for IV Thrombolytic due to too mild to treat, per MD. Patient is not a candidate for IR due to exam not consistent with LVO, per MD.   Care Plan: every 30 minute NIHSS and vital signs until patient is outside window at 1100 followed by q2hr. Swallow screen per protocol.    Bedside handoff with ED RN Baird Lyons  Stroke Response RN

## 2023-01-25 NOTE — ED Provider Notes (Signed)
[facility] Provider Note    Event Date/Time   First MD Initiated Contact with Patient 01/25/23 [phone]     (approximate)   History   Eye Pain   HPI  Wendy Kelly is a 67 y.o. female with a history of throat cancer who presents with complaints of a sensation of difficulty holding her right eye open and a pulling sensation in her right face.  She reports this started at 630 this morning, prior to that she was normal.  She reports a similar brief episode 1 week ago.  She also reports overnight she woke up with some palpitations which eventually resolved and she was able to go back to sleep     Physical Exam   Triage Vital Signs: ED Triage Vitals [01/25/23 0733]  Encounter Vitals Group     BP (!) 143/77     Systolic BP Percentile      Diastolic BP Percentile      Pulse Rate 80     Resp 18     Temp 98.3 F (36.8 C)     Temp Source Oral     SpO2 100 %     Weight      Height      Head Circumference      Peak Flow      Pain Score 3     Pain Loc      Pain Education      Exclude from Growth Chart     Most recent vital signs: Vitals:   01/25/23 0836 01/25/23 0930  BP: 116/83 99/71  Pulse: 73 90  Resp: 18 18  Temp:    SpO2:  99%     General: Awake, no distress.  CV:  Good peripheral perfusion.  Resp:  Normal effort.  Abd:  No distention.  Other:  Cranial nerves II through XII are normal, normal strength in the upper and lower extremities.   ED Results / Procedures / Treatments   Labs (all labs ordered are listed, but only abnormal results are displayed) Labs Reviewed  CBC - Abnormal; Notable for the following components:      Result Value   Platelets 147 (*)    All other components within normal limits  URINALYSIS, ROUTINE W REFLEX MICROSCOPIC - Abnormal; Notable for the following components:   Color, Urine YELLOW (*)    APPearance HAZY (*)    All other components within normal limits  PROTIME-INR  APTT  DIFFERENTIAL   COMPREHENSIVE METABOLIC PANEL  URINE DRUG SCREEN, QUALITATIVE ([facility] ONLY)  ETHANOL  CBG MONITORING, ED     EKG  ED ECG REPORT I, Jene Every, the attending physician, personally viewed and interpreted this ECG.  Date: 01/25/2023  Rhythm: normal sinus rhythm QRS Axis: normal Intervals: Abnormal ST/T Wave abnormalities: normal Narrative Interpretation: no evidence of acute ischemia or arrhythmia    RADIOLOGY CT head without acute abnormality     PROCEDURES:  Critical Care performed: yes  CRITICAL CARE Performed by: Jene Every   Total critical care time: 30 minutes  Critical care time was exclusive of separately billable procedures and treating other patients.  Critical care was necessary to treat or prevent imminent or life-threatening deterioration.  Critical care was time spent personally by me on the following activities: development of treatment plan with patient and/or surrogate as well as nursing, discussions with consultants, evaluation of patient's response to treatment, examination of patient, obtaining history from patient or surrogate, ordering and performing treatments and  interventions, ordering and review of laboratory studies, ordering and review of radiographic studies, pulse oximetry and re-evaluation of patient's condition.   Procedures   MEDICATIONS ORDERED IN ED: Medications - No data to display   IMPRESSION / MDM / ASSESSMENT AND PLAN / ED COURSE  I reviewed the triage vital signs and the nursing notes. Patient's presentation is most consistent with acute presentation with potential threat to life or bodily function.  Patient presents with sensation of difficulty holding her right eye lid open, this started at 6:30 AM, similar episode which resolved within minutes about a week ago.  Concern for possible stroke, given within the window, code stroke activated  Appreciate Dr. Sharen Hones consultation, she recommends no tPA at this time,  observing the patient in the emergency department until she is outside of the window for tPA to make sure no worsening.  At that time admission for stroke workup is recommended.  ----------------------------------------- 10:33 AM on 01/25/2023 ----------------------------------------- Will consult hospitalist for admission      FINAL CLINICAL IMPRESSION(S) / ED DIAGNOSES   Final diagnoses:  Cerebrovascular accident (CVA), unspecified mechanism (HCC)     Rx / DC Orders   ED Discharge Orders     None        Note:  This document was prepared using Dragon voice recognition software and may include unintentional dictation errors.   Jene Every, MD 01/25/23 [phone]

## 2023-01-25 NOTE — Progress Notes (Signed)
PHARMACIST - PHYSICIAN ORDER COMMUNICATION  CONCERNING: P&T Medication Policy on Herbal Medications  DESCRIPTION:  This patient's order for:  Cranberry Capsules and Digestive Enzymes  has been noted.  This product(s) is classified as an "herbal" or natural product. Due to a lack of definitive safety studies or FDA approval, nonstandard manufacturing practices, plus the potential risk of unknown drug-drug interactions while on inpatient medications, the Pharmacy and Therapeutics Committee does not permit the use of "herbal" or natural products of this type within [facility].   ACTION TAKEN: The pharmacy department is unable to verify this order at this time and your patient has been informed of this safety policy. Please reevaluate patient's clinical condition at discharge and address if the herbal or natural product(s) should be resumed at that time.  Clovia Cuff, PharmD, BCPS 01/25/2023 4:55 PM

## 2023-01-25 NOTE — Consult Note (Signed)
NEUROLOGY CONSULTATION NOTE   Date of service: January 25, 2023 Patient Name: Wendy Kelly MRN:  [id number] DOB:  11-20-1955 Reason for consult: stroke code Requesting physician: Dr. Cyril Loosen _ _ _   _ __   _ __ _ _  __ __   _ __   __ _  History of Present Illness   This is a 67 yo woman with hx throat cancer, remote DVT not on [facility], history of spells of expressive aphasia and facial numbness that have always resolved, and is s/p pacemaker implant. LKW 0630 this AM at which point she reported that she had difficulty holding her R eye open and had the sensation that her R face was pulling or drooping. She also reports ppain on eye movement (particular the right eye looking to the right). She reports a similar brief episode one week ago that resolved. Recently she has developed a mild R temporal headache. She also reports that she had palpitations overnight and felt that her heartbeat was "thumping" and irregular.  NIHSS = 1 for sensory deficit. CT head no acute process personal review. TNK was not administered 2/2 sx being too mild to treat. CTA was not performed as part of stroke code bc exam was not c/w LVO.   ROS   Per HPI: all other systems reviewed and are negative  Past History   I have reviewed the following:  Past Medical History:  Diagnosis Date   AA (aortic aneurysm) (HCC)    a. 04/2011 Echo: Ao Root: 4.1cm, Asc Ao 4.7cm.   Anxiety    AV block, 2nd degree    a. 05/2005 - s/p MDT Adapta ADDR01 Dual Chamber PPM   Bicuspid aortic valve    Cancer (HCC)    Throat   Chest pain    a. 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   COVID-19 virus infection 09/11/2019   Deep vein thrombosis (DVT) of left lower extremity (HCC) 10/03/2017   Dehydration 08/11/2017   Depression    Expressive aphasia    a. ongoing since 04/2011 - seen by neurology - ? TIA vs. Migraine   Facial numbness    a. ongoing since 04/2011 - seen by neurology - ? TIA vs. Migraine   GERD (gastroesophageal reflux disease)    Low blood  pressure    Personal history of chemotherapy 2018-2019   Throat   Personal history of radiation therapy 2018-2019   Throat   Presence of permanent cardiac pacemaker    Shingles    pt had on right side of head and his neck   Squamous cell carcinoma 05/2017   lymph node right side of neck   Syncope    a. 04/2011 Echo: EF 55-65%, No RWMA, Gr 1 DD.   Thyroid disease    Past Surgical History:  Procedure Laterality Date   ABDOMINAL WALL MESH  REMOVAL     bladder tack     cataract Bilateral 2004   CHOLECYSTECTOMY  2000   COLONOSCOPY WITH PROPOFOL N/A 06/26/2019   Procedure: COLONOSCOPY WITH PROPOFOL;  Surgeon: Midge Minium, MD;  Location: [facility] ENDOSCOPY;  Service: Endoscopy;  Laterality: N/A;   DILATION AND CURETTAGE OF UTERUS     DIRECT LARYNGOSCOPY Right 06/16/2017   Procedure: MICRO DIRECT LARYNGOSCOPY WITH BIOPSY OF RIGHT BASE OF TONGUE;  Surgeon: Bud Face, MD;  Location: [facility] ORS;  Service: ENT;  Laterality: Right;   EP IMPLANTABLE DEVICE N/A 06/04/2015   Procedure: PPM Generator Changeout;  Surgeon: Duke Salvia, MD;  Location: [facility]  INVASIVE CV LAB;  Service: Cardiovascular;  Laterality: N/A;   ESOPHAGOGASTRODUODENOSCOPY (EGD) WITH PROPOFOL N/A 12/18/2015   Procedure: ESOPHAGOGASTRODUODENOSCOPY (EGD) WITH gastric biopsy and dilation;  Surgeon: Midge Minium, MD;  Location: [facility];  Service: Endoscopy;  Laterality: N/A;   EYE SURGERY     INSERT / REPLACE / REMOVE PACEMAKER     IR FLUORO GUIDE PORT INSERTION RIGHT  06/22/2017   IR REMOVAL TUN ACCESS W/ PORT W/O FL MOD SED  04/14/2018   MOHS SURGERY  02/2018   PACEMAKER INSERTION  2006   [employer] Adapta ADDR01   TONSILLECTOMY     TONSILLECTOMY     VAGINAL HYSTERECTOMY  2013   mad/cope   Family History  Problem Relation Age of Onset   COPD Mother        alive @ 62   Atrial fibrillation Mother    Stroke Father        died @ 52   Breast cancer Maternal Aunt 20   COPD Maternal Grandmother    Ovarian cancer  Neg Hx    Diabetes Neg Hx    Social History   Socioeconomic History   Marital status: Married    Spouse name: Not on file   Number of children: Not on file   Years of education: Not on file   Highest education level: Not on file  Occupational History   Not on file  Tobacco Use   Smoking status: Never   Smokeless tobacco: Never   Tobacco comments:    tobacco use -no  Vaping Use   Vaping status: Never Used  Substance and Sexual Activity   Alcohol use: No   Drug use: No   Sexual activity: Yes    Birth control/protection: Surgical  Other Topics Concern   Not on file  Social History Narrative   Lives in [place] with husband.  Works out regularly.  Works as a Teacher, adult education - owns own business.    Social Determinants of Health   Financial Resource Strain: Low Risk  (06/15/2022)   Overall Financial Resource Strain (CARDIA)    Difficulty of Paying Living Expenses: Not hard at all  Food Insecurity: No Food Insecurity (06/15/2022)   Hunger Vital Sign    Worried About Running Out of Food in the Last Year: Never true    Ran Out of Food in the Last Year: Never true  Transportation Needs: No Transportation Needs (06/15/2022)   PRAPARE - Administrator, Civil Service (Medical): No    Lack of Transportation (Non-Medical): No  Physical Activity: Sufficiently Active (06/15/2022)   Exercise Vital Sign    Days of Exercise per Week: 4 days    Minutes of Exercise per Session: 40 min  Stress: Stress Concern Present (06/15/2022)   [employer] of Occupational Health - Occupational Stress Questionnaire    Feeling of Stress : Rather much  Social Connections: Moderately Integrated (06/15/2022)   Social Connection and Isolation Panel [NHANES]    Frequency of Communication with Friends and Family: More than three times a week    Frequency of Social Gatherings with Friends and Family: Three times a week    Attends Religious Services: More than 4 times per year    Active  Member of Clubs or Organizations: No    Attends Banker Meetings: Never    Marital Status: Married   Allergies  Allergen Reactions   Cefdinir Swelling    Swelling in mouth    Cephalosporins Swelling  Dairy Aid [Tilactase] Swelling    Milk and milk products cause swelling and tingling of tongue   Flagyl [Metronidazole] Swelling    Mouth and throat and ears go red and burn   Lactose Intolerance (Gi) Other (See Comments)    GI upset.  Eats only gluten free   Penicillins Rash    Rash/hives   Phenylephrine-Guaifenesin Anaphylaxis    Night terrors   Sulfa Antibiotics Rash and Hives    Other reaction(s): Unknown    Sulfonamide Derivatives Rash    Rash/itching.  From head to her toes, the itching was horrible. Penicillin is the worst   Tape Rash    Adhesives all cause a problem severely.  It doesn't matter if it is paper tape or not.   Dexilant [Dexlansoprazole]     Pressure in head like head is in a vice and being squeezed   Entex Lq [Phenylephrine-Guaifenesin]     Night terrors   Other Other (See Comments)    Flu vaccine:  Weakness, dizziness, tia type symptoms.     Oxycodone Nausea And Vomiting    And itching   Succinylcholine Other (See Comments)    Trouble waking up   Vancomycin Rash   Clarithromycin Itching and Other (See Comments)   Haemophilus Influenzae Vaccines Other (See Comments)    Stroke like symptoms   Tetracycline Other (See Comments)    Unkown   Codeine Nausea And Vomiting   Guaifenesin & Derivatives Other (See Comments)    Night terrors   Lactobacillus Itching   Naproxen Sodium     Itching/rash   Pseudoephedrine Rash   Tetracyclines & Related Other (See Comments)    Was taking flagyl at the same time; unsure which caused the swelling  Swelling of throat. Pharmacist states that he believes it was the flagyl and not the tetracycline   Wheat Other (See Comments)    Upset stomach. Now eats gluten free    Medications   (Not in a hospital  admission)     Current Facility-Administered Medications:    [START ON 01/26/2023]  stroke: early stages of recovery book, , Does not apply, Once, Lorretta Harp, MD   acetaminophen (TYLENOL) tablet 650 mg, 650 mg, Oral, Q4H PRN **OR** acetaminophen (TYLENOL) 160 MG/5ML solution 650 mg, 650 mg, Per Tube, Q4H PRN **OR** acetaminophen (TYLENOL) suppository 650 mg, 650 mg, Rectal, Q4H PRN, Lorretta Harp, MD   aspirin suppository 300 mg, 300 mg, Rectal, Daily **OR** aspirin tablet 325 mg, 325 mg, Oral, Daily, Lorretta Harp, MD   diphenhydrAMINE (BENADRYL) injection 12.5 mg, 12.5 mg, Intravenous, Q8H PRN, Lorretta Harp, MD   enoxaparin (LOVENOX) injection 40 mg, 40 mg, Subcutaneous, Q24H, Lorretta Harp, MD   hydrALAZINE (APRESOLINE) injection 5 mg, 5 mg, Intravenous, Q2H PRN, Lorretta Harp, MD   ondansetron (ZOFRAN) injection 4 mg, 4 mg, Intravenous, Q8H PRN, Lorretta Harp, MD   senna-docusate (Senokot-S) tablet 1 tablet, 1 tablet, Oral, QHS PRN, Lorretta Harp, MD  Current Outpatient Medications:    azelastine (ASTELIN) 0.1 % nasal spray, SMARTSIG:1-2 Spray(s) Both Nares Every 12 Hours PRN, Disp: , Rfl:    b complex vitamins tablet, Take 2 tablets by mouth daily., Disp: , Rfl:    Calcium Carb-Cholecalciferol (CALCIUM 500 + D3 PO), Take 2 Doses by mouth daily. , Disp: , Rfl:    CRANBERRY SOFT PO, Take 2 each by mouth daily. , Disp: , Rfl:    Digestive Enzymes (DIGESTIVE ENZYME PO), Take 1-2 capsules by mouth daily., Disp: , Rfl:  levothyroxine (SYNTHROID) 75 MCG tablet, TAKE 1 TABLET BY MOUTHY DAILY, Disp: 90 tablet, Rfl: 1   PRESCRIPTION MEDICATION, Place 1 tablet vaginally 3 (three) times a week. E3 1mg  and Testosterone 1mg  compound, Disp: , Rfl:    sodium chloride (MURO 128) 5 % ophthalmic solution, Place 1 drop into both eyes daily., Disp: , Rfl:    BLACK ELDERBERRY,BERRY-FLOWER, PO, Take 2 capsules by mouth daily.  (Patient not taking: Reported on 12/03/2022), Disp: , Rfl:   Facility-Administered Medications Ordered  in Other Encounters:    0.9 %  sodium chloride infusion, , Intravenous, Once, Creig Hines, MD  Vitals   Vitals:   01/25/23 0836 01/25/23 0930 01/25/23 1000 01/25/23 1100  BP: 116/83 99/71 118/72 125/84  Pulse: 73 90 60 64  Resp: 18 18 17 18   Temp:      TempSrc:      SpO2:  99% 98% 100%  Weight:    67.1 kg     Body mass index is 26.22 kg/m.  Physical Exam   Physical Exam Gen: A&O x4, NAD HEENT: Atraumatic, normocephalic;mucous membranes moist; oropharynx clear, tongue without atrophy or fasciculations. Neck: Supple, trachea midline. Resp: CTAB, no w/r/r CV: RRR, no m/g/r; nml S1 and S2. 2+ symmetric peripheral pulses. Abd: soft/NT/ND; nabs x 4 quad Extrem: Nml bulk; no cyanosis, clubbing, or edema.  Neuro: *MS: A&O x4. Follows multi-step commands.  *Speech: fluid, nondysarthric, able to name and repeat *CN:    I: Deferred   II,III: PERRLA, VFF by confrontation, optic discs unable to be visualized 2/2 pupillary constriction   III,IV,VI: EOMI w/o nystagmus, no ptosis   V: Sensory deficit to R face   VII: Eyelid closure was full.  Smile symmetric.   VIII: Hearing intact to voice   IX,X: Voice normal, palate elevates symmetrically    XI: SCM/trap 5/5 bilat   XII: Tongue protrudes midline, no atrophy or fasciculations   *Motor:   Normal bulk.  No tremor, rigidity or bradykinesia. No pronator drift.    Strength: Dlt Bic Tri WrE WrF FgS Gr HF KnF KnE PlF DoF    Left 5 5 5 5 5 5 5 5 5 5 5 5     Right 5 5 5 5 5 5 5 5 5 5 5 5    *Sensory: Intact to light touch, pinprick, temperature vibration throughout. Symmetric. Propioception intact bilat.  No double-simultaneous extinction.  *Coordination:  Finger-to-nose, heel-to-shin, rapid alternating motions were intact. *Reflexes:  2+ and symmetric throughout without clonus; toes down-going bilat *Gait: deferred  NIHSS = 1 for sensory deficit  Premorbid mRS = 1   Labs   CBC:  Recent Labs  Lab 01/25/23 0802  WBC 4.7   NEUTROABS 3.6  HGB 14.1  HCT 43.2  MCV 92.5  PLT 147*    Basic Metabolic Panel:  Lab Results  Component Value Date   NA 142 01/25/2023   K 3.6 01/25/2023   CO2 27 01/25/2023   GLUCOSE 98 01/25/2023   BUN 20 01/25/2023   CREATININE 0.65 01/25/2023   CALCIUM 9.2 01/25/2023   GFRNONAA >60 01/25/2023   GFRAA 82 04/29/2020   Lipid Panel:  Lab Results  Component Value Date   LDLCALC 87 05/04/2022   HgbA1c:  Lab Results  Component Value Date   HGBA1C 5.2 04/29/2020   Urine Drug Screen:     Component Value Date/Time   LABOPIA NONE DETECTED 01/25/2023 0905   COCAINSCRNUR NONE DETECTED 01/25/2023 0905   LABBENZ NONE DETECTED 01/25/2023 0905  AMPHETMU NONE DETECTED 01/25/2023 0905   THCU NONE DETECTED 01/25/2023 0905   LABBARB NONE DETECTED 01/25/2023 0905    Alcohol Level     Component Value Date/Time   ETH <10 01/25/2023 0830    CT Head without contrast: No acute process on personal review   Impression   This is a 67 yo woman with hx throat cancer, remote DVT not on AC, history of spells of expressive aphasia and facial numbness that have always resolved, and is s/p pacemaker implant who presented with R facial weakness and R eye pain when looking to the right. She also reports R temporal headache as of late and some blurry vision. These findings are c/f GCA, although that would not explain her facial droop (I was not able to evaluate it bc it resolved prior to my exam). We monitored her closely in the ED until she was outside of the TNK window. OK to admit her now to hospitalist for stroke/TIA workup and GCA screening.  Recommendations   - Admit for stroke/TIA workup - Permissive HTN x48 hrs from sx onset goal BP <220/110. PRN labetalol or hydralazine if BP above these parameters. Avoid oral antihypertensives. - Patient is unable to undergo MRI at [facility] 2/2 PM. Recommend that she instead have repeat noncon head CT at 24 hrs to better assess for any evolving  ischemia. - CTA H&N - TTE - Check A1c and LDL + add statin per guidelines - ASA 81mg  daily + plavix 75mg  daily x21 days f/b ASA 81mg  daily monotherapy after that - q4 hr neuro checks - STAT head CT for any change in neuro exam - Tele - PT/OT/SLP - Stroke education - Amb referral to neurology upon discharge  - Tele - Interrogate PM if possible - palpitations overnight - ESR, CRP. If elevated, low threshold to start empiric steroids and order temporal artery ultrasound  Will continue to follow. ______________________________________________________________________   Thank you for the opportunity to take part in the care of this patient. If you have any further questions, please contact the neurology consultation attending.  Signed,  Bing Neighbors, MD [facility] [phone]  If 7pm- 7am, please page neurology on call as listed in AMION.  **Any copied and pasted documentation in this note was written by me in another application not billed for and pasted by me into this document.

## 2023-01-25 NOTE — Plan of Care (Signed)
  Problem: Education: Goal: Knowledge of disease or condition will improve Outcome: Progressing   Problem: Ischemic Stroke/TIA Tissue Perfusion: Goal: Complications of ischemic stroke/TIA will be minimized Outcome: Progressing  Stroke book given to pt on admission Problem: Education: Goal: Knowledge of General Education information will improve Description: Including pain rating scale, medication(s)/side effects and non-pharmacologic comfort measures Outcome: Progressing   Problem: Activity: Goal: Risk for activity intolerance will decrease Outcome: Progressing  Pt ambulated to the bathroom independently without DME; tolerate well Problem: Pain Managment: Goal: General experience of comfort will improve Outcome: Progressing   Problem: Safety: Goal: Ability to remain free from injury will improve Outcome: Progressing

## 2023-01-25 NOTE — Progress Notes (Signed)
PT Cancellation Note  Patient Details Name: Wendy Kelly MRN: [id number] DOB: April 10, 1956   Cancelled Treatment:    Reason Eval/Treat Not Completed: PT screened, no needs identified, will sign off. Per OT pt walked independently in the ED hallways without an AD and is at functional baseline with no skilled PT needs.  Will complete PT orders at this time but will reassess pt pending a change in status upon receipt of new PT orders.  Ovidio Hanger PT, DPT 01/25/23, 1:17 PM

## 2023-01-25 NOTE — Progress Notes (Signed)
*  PRELIMINARY RESULTS* Echocardiogram 2D Echocardiogram has been performed.  Wendy Kelly 01/25/2023, 2:15 PM

## 2023-01-25 NOTE — H&P (Addendum)
History and Physical    Erilynn Hyden UEA:[id number] DOB: Oct 29, 1955 DOA: 01/25/2023  Referring MD/NP/PA:   PCP: Dorcas Carrow, DO   Patient coming from:  The patient is coming from home.     Chief Complaint: right facial droop, not able to open right eye normally, right temporal headache  HPI: Illiana Kelly is a 67 y.o. female with medical history significant of TIA, hypothyroidism, depression with anxiety, A-fib not on anticoagulants, DVT not on anticoagulants, bicuspid aortic valve, ascending aortic aneurysm, throat cancer (s/p of radiation and chemotherapy), PPM, who presents with right facial droop, not able to open right eye normally, right temple headache.  Patient states that her symptoms started this morning at about 630.  She described as right facial weakness, feeling right facial droopy.  She states that she cannot open her right eye normally.  She states that she has minimal right eye blurred vision. Denies unilateral numbness tingling to extremities.  No difficulty speaking.  She also reports mild right temple headache.  She had palpitation earlier, which has resolved.  No chest pain, cough, shortness of breath.  No fever or chills.  No nausea, vomiting, diarrhea or abdominal pain.  No symptoms of UTI.  Data reviewed independently and ED Course: pt was found to have WBC 4.7, GFR> 60, temperature normal, blood pressure 99/71, heart rate 90, RR 18, oxygen saturation 89% on room air.  CT of head is negative for acute intracranial abnormalities.  CTA of head and neck negative for LOV. Patient is placed on telemetry bed of observation, Dr. Selina Cooley of neuro is consulted.   EKG: I have personally reviewed.  Sinus rhythm, QTc 420, LAD, poor R wave progression   Review of Systems:   General: no fevers, chills, no body weight gain, fatigue HEENT: no blurry vision, hearing changes or sore throat Respiratory: no dyspnea, coughing, wheezing CV: no chest pain, has palpitations GI: no  nausea, vomiting, abdominal pain, diarrhea, constipation GU: no dysuria, burning on urination, increased urinary frequency, hematuria  Ext: no leg edema Neuro: has right facial droop, not able to open right eye normally, right temporal headache Skin: no rash, no skin tear. MSK: No muscle spasm, no deformity, no limitation of range of movement in spin Heme: No easy bruising.  Travel history: No recent long distant travel.   Allergy:  Allergies  Allergen Reactions   Cefdinir Swelling    Swelling in mouth    Cephalosporins Swelling   Dairy Aid [Tilactase] Swelling    Milk and milk products cause swelling and tingling of tongue   Flagyl [Metronidazole] Swelling    Mouth and throat and ears go red and burn   Lactose Intolerance (Gi) Other (See Comments)    GI upset.  Eats only gluten free   Penicillins Rash    Rash/hives   Phenylephrine-Guaifenesin Anaphylaxis    Night terrors   Sulfa Antibiotics Rash and Hives    Other reaction(s): Unknown    Sulfonamide Derivatives Rash    Rash/itching.  From head to her toes, the itching was horrible. Penicillin is the worst   Tape Rash    Adhesives all cause a problem severely.  It doesn't matter if it is paper tape or not.   Dexilant [Dexlansoprazole]     Pressure in head like head is in a vice and being squeezed   Entex Lq [Phenylephrine-Guaifenesin]     Night terrors   Other Other (See Comments)    Flu vaccine:  Weakness, dizziness, tia type symptoms.  Oxycodone Nausea And Vomiting    And itching   Succinylcholine Other (See Comments)    Trouble waking up   Vancomycin Rash   Clarithromycin Itching and Other (See Comments)   Haemophilus Influenzae Vaccines Other (See Comments)    Stroke like symptoms   Tetracycline Other (See Comments)    Unkown   Codeine Nausea And Vomiting   Guaifenesin & Derivatives Other (See Comments)    Night terrors   Lactobacillus Itching   Naproxen Sodium     Itching/rash   Pseudoephedrine Rash    Tetracyclines & Related Other (See Comments)    Was taking flagyl at the same time; unsure which caused the swelling  Swelling of throat. Pharmacist states that he believes it was the flagyl and not the tetracycline   Wheat Other (See Comments)    Upset stomach. Now eats gluten free    Past Medical History:  Diagnosis Date   AA (aortic aneurysm) (HCC)    a. 04/2011 Echo: Ao Root: 4.1cm, Asc Ao 4.7cm.   Anxiety    AV block, 2nd degree    a. 05/2005 - s/p MDT Adapta ADDR01 Dual Chamber PPM   Bicuspid aortic valve    Cancer (HCC)    Throat   Chest pain    a. 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   COVID-19 virus infection 09/11/2019   Deep vein thrombosis (DVT) of left lower extremity (HCC) 10/03/2017   Dehydration 08/11/2017   Depression    Expressive aphasia    a. ongoing since 04/2011 - seen by neurology - ? TIA vs. Migraine   Facial numbness    a. ongoing since 04/2011 - seen by neurology - ? TIA vs. 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 04/2011 Echo: EF 55-65%, No RWMA, Gr 1 DD.   Thyroid disease     Past Surgical History:  Procedure Laterality Date   ABDOMINAL WALL MESH  REMOVAL     bladder tack     cataract Bilateral 2004   CHOLECYSTECTOMY  2000   COLONOSCOPY WITH PROPOFOL N/A 06/26/2019   Procedure: COLONOSCOPY WITH PROPOFOL;  Surgeon: Midge Minium, MD;  Location: [facility];  Service: Endoscopy;  Laterality: N/A;   DILATION AND CURETTAGE OF UTERUS     DIRECT LARYNGOSCOPY Right 06/16/2017   Procedure: MICRO DIRECT LARYNGOSCOPY WITH BIOPSY OF RIGHT BASE OF TONGUE;  Surgeon: Bud Face, MD;  Location: [facility] ORS;  Service: ENT;  Laterality: Right;    EP IMPLANTABLE DEVICE N/A 06/04/2015   Procedure: PPM Generator Changeout;  Surgeon: Duke Salvia, MD;  [facility] INVASIVE CV LAB;  Service: Cardiovascular;  Laterality: N/A;   ESOPHAGOGASTRODUODENOSCOPY (EGD) WITH PROPOFOL N/A 12/18/2015   Procedure: ESOPHAGOGASTRODUODENOSCOPY (EGD) WITH gastric biopsy and dilation;  Surgeon: Midge Minium, MD;  Location: [facility];  Service: Endoscopy;  Laterality: N/A;   EYE SURGERY     INSERT / REPLACE / REMOVE PACEMAKER     IR FLUORO GUIDE PORT INSERTION RIGHT  06/22/2017   IR REMOVAL TUN ACCESS W/ PORT W/O FL MOD SED  04/14/2018   MOHS SURGERY  02/2018   PACEMAKER INSERTION  2006   [employer] Adapta ADDR01   TONSILLECTOMY     TONSILLECTOMY     VAGINAL HYSTERECTOMY  2013   mad/cope    Social History:  reports that she has never smoked. She has never used smokeless tobacco. She reports that she does not drink alcohol and does not use drugs.  Family History:  Family History  Problem Relation Age of Onset   COPD Mother        alive @ 88   Atrial fibrillation Mother    Stroke Father        died @ 39   Breast cancer Maternal Aunt 14   COPD Maternal Grandmother    Ovarian cancer Neg Hx    Diabetes Neg Hx      Prior to Admission medications   Medication Sig Start Date End Date Taking? Authorizing Provider  azelastine (ASTELIN) 0.1 % nasal spray SMARTSIG:1-2 Spray(s) Both Nares Every 12 Hours PRN 08/11/22   [provider]  b complex vitamins tablet Take 2 tablets by mouth daily.    [provider]  BLACK ELDERBERRY,BERRY-FLOWER, PO Take 2 capsules by mouth daily.  Patient not taking: Reported on 12/03/2022    [provider]  Calcium Carb-Cholecalciferol (CALCIUM 500 + D3 PO) Take 2 Doses by mouth daily.     [provider]  CRANBERRY SOFT PO Take 2 each by mouth daily.     [provider]  Digestive Enzymes (DIGESTIVE ENZYME PO) Take 1-2 capsules by mouth daily.    [provider]   levothyroxine (SYNTHROID) 75 MCG tablet TAKE 1 TABLET BY MOUTHY DAILY 11/22/22   Johnson, Megan P, DO  PRESCRIPTION MEDICATION Place 1 tablet vaginally 3 (three) times a week. E3 1mg  and Testosterone 1mg  compound    [provider]  sodium chloride (MURO 128) 5 % ophthalmic solution Place 1 drop into both eyes daily.    [provider]    Physical Exam: Vitals:   01/26/23 0335 01/26/23 0734 01/26/23 1202 01/26/23 1202  BP: 106/72 116/71 134/69 134/69  Pulse: 68 60 76 66  Resp: 18 16 16 16   Temp: 98.6 F (37 C) 97.9 F (36.6 C) 98.2 F (36.8 C) 98.2 F (36.8 C)  TempSrc: Oral     SpO2:  98% 98%   Weight:      Height:       General: Not in acute distress HEENT:       Eyes: PERRL, EOMI, no jaundice       ENT: No discharge from the ears and nose, no pharynx injection, no tonsillar enlargement.        Neck: No JVD, no bruit, no mass felt. Heme: No neck lymph node enlargement. Cardiac: S1/S2, RRR,  No gallops or rubs. Respiratory: No rales, wheezing, rhonchi or rubs. GI: Soft, nondistended, nontender, no rebound pain, no organomegaly, BS present. GU: No hematuria Ext: No pitting leg edema bilaterally. 1+DP/PT pulse bilaterally. Musculoskeletal: No joint deformities, No joint redness or warmth, no limitation of ROM in spin. Skin: No rashes.  Neuro: Alert, oriented X3, cranial nerves II-XII grossly intact, moves all extremities normally. Muscle strength 5/5 in all extremities, sensation to light touch intact.  Psych: Patient is not psychotic, no suicidal or hemocidal ideation.  Labs on Admission: I have personally reviewed following labs and imaging studies  CBC: No results for input(s): "WBC", "NEUTROABS", "HGB", "HCT", "MCV", "PLT" in the last 168 hours.  Basic Metabolic Panel: No results  for input(s): "NA", "K", "CL", "CO2", "GLUCOSE", "BUN", "CREATININE", "CALCIUM", "MG", "PHOS" in the last 168 hours.  GFR: CrCl cannot be calculated (Patient's most recent  lab result is older than the maximum 21 days allowed.). Liver Function Tests: No results for input(s): "AST", "ALT", "ALKPHOS", "BILITOT", "PROT", "ALBUMIN" in the last 168 hours.  No results for input(s): "LIPASE", "AMYLASE" in the last 168 hours. No results for input(s): "AMMONIA" in the last 168 hours. Coagulation Profile: No results for input(s): "INR", "PROTIME" in the last 168 hours.  Cardiac Enzymes: No results for input(s): "CKTOTAL", "CKMB", "CKMBINDEX", "TROPONINI" in the last 168 hours. BNP (last 3 results) No results for input(s): "PROBNP" in the last 8760 hours. HbA1C: No results for input(s): "HGBA1C" in the last 72 hours.  CBG: No results for input(s): "GLUCAP" in the last 168 hours.  Lipid Profile: No results for input(s): "CHOL", "HDL", "LDLCALC", "TRIG", "CHOLHDL", "LDLDIRECT" in the last 72 hours. Thyroid Function Tests: No results for input(s): "TSH", "T4TOTAL", "FREET4", "T3FREE", "THYROIDAB" in the last 72 hours. Anemia Panel: No results for input(s): "VITAMINB12", "FOLATE", "FERRITIN", "TIBC", "IRON", "RETICCTPCT" in the last 72 hours. Urine analysis:    Component Value Date/Time   COLORURINE YELLOW (A) 01/25/2023 0905   APPEARANCEUR HAZY (A) 01/25/2023 0905   APPEARANCEUR Clear 12/03/2022 0855   LABSPEC 1.017 01/25/2023 0905   PHURINE 6.0 01/25/2023 0905   GLUCOSEU NEGATIVE 01/25/2023 0905   HGBUR NEGATIVE 01/25/2023 0905   BILIRUBINUR NEGATIVE 01/25/2023 0905   BILIRUBINUR Negative 12/03/2022 0855   KETONESUR NEGATIVE 01/25/2023 0905   PROTEINUR NEGATIVE 01/25/2023 0905   UROBILINOGEN 0.2 12/29/2016 1101   NITRITE NEGATIVE 01/25/2023 0905   LEUKOCYTESUR NEGATIVE 01/25/2023 0905   Sepsis Labs: @LABRCNTIP (procalcitonin:4,lacticidven:4) )No results found for this or any previous visit (from the past 240 hour(s)).   Radiological Exams on Admission: No results found.    Assessment/Plan Principal Problem:   TIA (transient ischemic  attack) Active Problems:   Atrial fibrillation (HCC)   Hypothyroidism   Right sided temporal headache   Assessment and Plan:  Possible TIA: Patient symptoms are concerning for possible TIA.  Patient has pacemaker placement, cannot do MRI.  CT head negative.  CT of head and neck negative for LVO.  Dr. Selina Cooley of neurology is consulted, recommended to repeat CT of head after 24 hours.  - Placed on tele bed for observation - Repeat CT-head at 8:00 in AM - will hold oral Bp meds to allow permissive HTN  - Plavix and ASA  - Statin: pt refused statin due to concerning for side effects - fasting lipid panel and HbA1c  - 2D transthoracic echocardiography  - swallowing screen. If fails, will get SLP  Addendum: pt was admitted to the hospital for probable TIA. Initially suspected for possible stroke, but her initial CT of head and the repeated CT of head about 24 hours later did not show any evidence of acute stroke. Her symptoms have resolved, indicating TIA is more likely the diagnosis. Pt cannot do MRI-brain due to presence of pacemaker. Per patient's request, will remove Stroke diagnosis and change it to possible TIA.    Hx of TIA (transient ischemic attack) -ASA and plavix  Atrial fibrillation [facility]): CHADS2 score is 4.  Patient will need long-term anticoagulants, but in the setting for possible acute stroke, will not start anticoagulants now.  Heart rate 90. -Telemetry monitoring -Patient is on aspirin and Plavix  Hypothyroidism -Synthroid  Right-sided temporal headache: Will need to rule out temporal arteritis -As needed  Tylenol -Check ESR --> normal 8 -Check CRP      DVT ppx: sQ Lovenox  Code Status: Full code    Family Communication:   Yes, patient's husband  at bed side.    Disposition Plan:  Anticipate discharge back to previous environment  Consults called: Dr. Selina Cooley of neuro  Admission status and Level of care: Telemetry Medical:    for obs    Dispo: The patient  is from: Home              Anticipated d/c is to: Home              Anticipated d/c date is: 1 day              Patient currently is not medically stable to d/c.    Severity of Illness:  The appropriate patient status for this patient is OBSERVATION. Observation status is judged to be reasonable and necessary in order to provide the required intensity of service to ensure the patient's safety. The patient's presenting symptoms, physical exam findings, and initial radiographic and laboratory data in the context of their medical condition is felt to place them at decreased risk for further clinical deterioration. Furthermore, it is anticipated that the patient will be medically stable for discharge from the hospital within 2 midnights of admission.        Date of Service 04/16/2023    Lorretta Harp [facility]   If 7PM-7AM, please contact night-coverage [email or web address] 04/16/2023, 11:20 AM

## 2023-01-25 NOTE — ED Triage Notes (Signed)
Pt presents to ED with c/o of R eye pain. No injury to to eye noted. Pt states she feels like she has a film on here eyes. Pt denies weakness or stroke like s/s. NAD noted.

## 2023-01-26 ENCOUNTER — Observation Stay: Payer: PPO

## 2023-01-26 ENCOUNTER — Other Ambulatory Visit: Payer: Self-pay | Admitting: Neurology

## 2023-01-26 DIAGNOSIS — G459 Transient cerebral ischemic attack, unspecified: Secondary | ICD-10-CM | POA: Diagnosis not present

## 2023-01-26 DIAGNOSIS — R519 Headache, unspecified: Secondary | ICD-10-CM

## 2023-01-26 DIAGNOSIS — R29818 Other symptoms and signs involving the nervous system: Secondary | ICD-10-CM | POA: Diagnosis not present

## 2023-01-26 LAB — LIPID PANEL
Cholesterol: 170 mg/dL (ref 0–200)
HDL: 71 mg/dL (ref >40)
LDL Cholesterol: 87 mg/dL (ref 0–99)
Total CHOL/HDL Ratio: 2.4 RATIO
Triglycerides: 62 mg/dL (ref <150)
VLDL: 12 mg/dL (ref 0–40)

## 2023-01-26 LAB — HIV ANTIBODY (ROUTINE TESTING W REFLEX): HIV Screen 4th Generation wRfx: NONREACTIVE

## 2023-01-26 MED ORDER — CLOPIDOGREL BISULFATE 75 MG PO TABS: 75.0000 mg | ORAL_TABLET | Freq: Every day | ORAL | 0 refills | Status: DC

## 2023-01-26 MED ORDER — ATORVASTATIN CALCIUM 20 MG PO TABS: 20.0000 mg | ORAL_TABLET | Freq: Every day | ORAL | 0 refills | Status: DC

## 2023-01-26 MED ORDER — ASPIRIN 81 MG PO TBEC: 81.0000 mg | DELAYED_RELEASE_TABLET | Freq: Every day | ORAL | Status: DC

## 2023-01-26 NOTE — Progress Notes (Signed)
Ambulatory referral to neurology placed for hospital follow up.  Bing Neighbors, MD [facility] [phone]  If 7pm- 7am, please page neurology on call as listed in AMION.

## 2023-01-26 NOTE — TOC CM/SW Note (Signed)
Transition of Care [facility]) - Inpatient Brief Assessment   Patient Details  Name: Wendy Kelly MRN: [id number] Date of Birth: 25-Jan-1956  Transition of Care [facility]) CM/SW Contact:    Allena Katz, LCSW Phone Number: 01/26/2023, 8:49 AM   Clinical Narrative:    Transition of Care Asessment: Insurance and Status: Insurance coverage has been reviewed Patient has primary care physician: Yes Home environment has been reviewed: [id number] [place] [id number]- Prior level of function:: Independent Prior/Current Home Services: No current home services Social Determinants of Health Reivew: SDOH reviewed no interventions necessary Readmission risk has been reviewed: Yes Transition of care needs: no transition of care needs at this time

## 2023-01-26 NOTE — Progress Notes (Signed)
Mobility Specialist - Progress Note  01/26/23 0936  Mobility  Activity Ambulated independently in room;Ambulated independently in hallway  Level of Assistance Independent  Assistive Device None  Distance Ambulated (ft) 200 ft  Activity Response Tolerated well  $Mobility charge 1 Mobility  Mobility Specialist Start Time (ACUTE ONLY) [id number]  Mobility Specialist Stop Time (ACUTE ONLY) 0933  Mobility Specialist Time Calculation (min) (ACUTE ONLY) 11 min   Pt standing at the end of the bed gathering items amid d/c, utilizing RA. Pt agreeable to amb in the hallway this date. Pt amb one lap around the NS without AD, tolerated well. Pt denied pain during session. Pt left amb within the room, with needs within reach.   Zetta Bills Mobility Specialist 01/26/23 9:39 AM

## 2023-01-26 NOTE — Progress Notes (Signed)
SLP Cancellation Note  Patient Details Name: Wendy Kelly MRN: [id number] DOB: Jun 03, 1956   Cancelled treatment:       Reason Eval/Treat Not Completed: SLP screened, no needs identified, will sign off. Pt alert and oriented x4. No aphasia or dysarthria noted in conversational speech. Pt denied current concern/need for SLP services. No further SLP services indicated. SLP will complete order.   [name]  MS [facility] SLP    [name] 01/26/2023, 11:13 AM

## 2023-01-26 NOTE — Progress Notes (Signed)
Pt refused morning medication stating that she had already taken 3 medications this morning.  Together, we reviewed her medications administered overnight and patient states that when she received her synthroid this morning there were 3 pills in the cup and she did not want to take any other medications at this time because she is not sure what she took other than synthroid this morning.  MAR and pyxis reviewed and synthroid was the only medication charted or removed at that time.  MD made aware that patient did not want to take medications.

## 2023-01-26 NOTE — Discharge Summary (Addendum)
Physician Discharge Summary   Patient: Wendy Kelly MRN: [id number] DOB: Oct 07, 1955  Admit date:     01/25/2023  Discharge date: 04/18/23  Discharge Physician: Lurene Shadow   PCP: Dorcas Carrow, DO   Recommendations at discharge:    Follow up with PCP in 1 to 2 weeks Follow-up with neurologist in 1 month  Discharge Diagnoses: Principal Problem:   TIA (transient ischemic attack) Active Problems:   Atrial fibrillation (HCC)   Hypothyroidism   Right sided temporal headache  Resolved Problems:   * No resolved hospital problems. *  Hospital Course:  Wendy Kelly is a 67 y.o. female with medical history significant of TIA, history of Bell's palsy, hypothyroidism, depression with anxiety, A-fib not on anticoagulants, DVT not on anticoagulants, bicuspid aortic valve, ascending aortic aneurysm, throat cancer (s/p of radiation and chemotherapy), PPM, who presented to the hospital because of right facial droop, right temporal headache and inability to open the right eye normally.  She said the right side of her face felt weak, droopy and achy.  This was associated with mild right eye blurry vision.  She did not have any other symptoms.  She was admitted to the hospital for probable TIA.  Initial CT head and repeat CT head about 24 hours later did not show any evidence of acute stroke.  She was evaluated by the neurologist who recommended dual antiplatelet therapy with low-dose aspirin and Plavix for 21 days followed by low-dose aspirin monotherapy.  All her symptoms have resolved.  She is deemed stable for discharge to home today.  Discharge plan was discussed with the patient and her husband at the bedside.        Consultants: Neurologist Procedures performed: None Disposition: Home Diet recommendation:  Discharge Diet Orders (From admission, onward)     Start     Ordered   01/26/23 0000  Diet - low sodium heart healthy        01/26/23 1226           Cardiac  diet DISCHARGE MEDICATION: Allergies as of 01/26/2023       Reactions   Cefdinir Swelling   Swelling in mouth   Cephalosporins Swelling   Dairy Aid [tilactase] Swelling   Milk and milk products cause swelling and tingling of tongue   Flagyl [metronidazole] Swelling   Mouth and throat and ears go red and burn   Lactose Intolerance (gi) Other (See Comments)   GI upset.  Eats only gluten free   Penicillins Rash   Rash/hives   Phenylephrine-guaifenesin Anaphylaxis   Night terrors   Sulfa Antibiotics Rash, Hives   Other reaction(s): Unknown   Sulfonamide Derivatives Rash   Rash/itching.  From head to her toes, the itching was horrible. Penicillin is the worst   Tape Rash   Adhesives all cause a problem severely.  It doesn't matter if it is paper tape or not.   Dexilant [dexlansoprazole]    Pressure in head like head is in a vice and being squeezed   Entex Lq [phenylephrine-guaifenesin]    Night terrors   Other Other (See Comments)   Flu vaccine:  Weakness, dizziness, tia type symptoms.     Oxycodone Nausea And Vomiting   And itching   Succinylcholine Other (See Comments)   Trouble waking up   Vancomycin Rash   Clarithromycin Itching, Other (See Comments)   Haemophilus Influenzae Vaccines Other (See Comments)   Stroke like symptoms   Tetracycline Other (See Comments)   Unkown  Codeine Nausea And Vomiting   Guaifenesin & Derivatives Other (See Comments)   Night terrors   Lactobacillus Itching   Naproxen Sodium    Itching/rash   Pseudoephedrine Rash   Tetracyclines & Related Other (See Comments)   Was taking flagyl at the same time; unsure which caused the swelling  Swelling of throat. Pharmacist states that he believes it was the flagyl and not the tetracycline   Wheat Other (See Comments)   Upset stomach. Now eats gluten free        Medication List     STOP taking these medications    BLACK ELDERBERRY(BERRY-FLOWER) PO       TAKE these medications     azelastine 0.1 % nasal spray Commonly known as: ASTELIN SMARTSIG:1-2 Spray(s) Both Nares Every 12 Hours PRN   b complex vitamins tablet Take 2 tablets by mouth daily.   CALCIUM 500 + D3 PO Take 2 Doses by mouth daily.   CRANBERRY SOFT PO Take 2 each by mouth daily.   DIGESTIVE ENZYME PO Take 1-2 capsules by mouth daily.   levothyroxine 75 MCG tablet Commonly known as: SYNTHROID TAKE 1 TABLET BY MOUTHY DAILY   PRESCRIPTION MEDICATION Place 1 tablet vaginally 3 (three) times a week. E3 1mg  and Testosterone 1mg  compound   sodium chloride 5 % ophthalmic solution Commonly known as: MURO 128 Place 1 drop into both eyes daily.        Follow-up Information     [facility]. Schedule an appointment as soon as possible for a visit in 1 month(s).   Contact information: [address] [place] [address] [phone]               Discharge Exam: Ceasar Mons Weights   01/25/23 1100 01/25/23 1612  Weight: 67.1 kg 70.6 kg   GEN: NAD SKIN: Warm and dry EYES: No pallor or icterus ENT: MMM CV: RRR PULM: CTA B ABD: soft, ND, NT, +BS CNS: AAO x 3, non focal, no facial palsy EXT: No edema or tenderness   Condition at discharge: good  The results of significant diagnostics from this hospitalization (including imaging, microbiology, ancillary and laboratory) are listed below for reference.   Imaging Studies: CT ANGIO CHEST AORTA W/CM & OR WO/CM  Result Date: 03/30/2023 CLINICAL DATA:  Thoracic aortic aneurysm, follow-up, currently asymptomatic EXAM: CT ANGIOGRAPHY CHEST WITH CONTRAST TECHNIQUE: Multidetector CT imaging of the chest was performed using the standard protocol during bolus administration of intravenous contrast. Multiplanar CT image reconstructions and MIPs were obtained to evaluate the vascular anatomy. RADIATION DOSE REDUCTION: This exam was performed according to the departmental dose-optimization program  which includes automated exposure control, adjustment of the mA and/or kV according to patient size and/or use of iterative reconstruction technique. CONTRAST:  75mL ISOVUE-370 IOPAMIDOL (ISOVUE-370) INJECTION 76% COMPARISON:  03/24/2022 FINDINGS: Cardiovascular: SVC patent. Stable left subclavian dual lead pacemaker. Heart size normal. No pericardial effusion. Satisfactory opacification of pulmonary arteries noted, and there is no evidence of pulmonary emboli. Aortic valve leaflet calcifications. Good contrast opacification of the thoracic aorta, without dissection or stenosis. Classic 3-vessel brachiocephalic arterial origin anatomy without proximal stenosis. Aortic Root: --Valve: 2.4 cm --Sinuses: 4.1 cm --Sinotubular Junction: 3.7 cm Limitations by motion: Moderate Thoracic Aorta: --Ascending Aorta: 4.8 cm (previously 4.6) --Aortic Arch: 3.7 cm --Descending Aorta: 2.5 cm Mediastinum/Nodes: No  hematoma, mass, or adenopathy. Lungs/Pleura: No pleural effusion. No pneumothorax. 7 mm ground-glass nodule, superior segment right lower lobe, new since previous. Upper  Abdomen: No acute findings. Musculoskeletal: No chest wall abnormality. No acute or significant osseous findings. Review of the MIP images confirms the above findings. IMPRESSION: 1. 4.8 cm ascending thoracic aortic aneurysm, previously 4.6 cm. Recommend semi-annual imaging followup by CTA or MRA and referral to cardiothoracic surgery if not already obtained. This recommendation follows 2010 ACCF/AHA/AATS/ACR/ASA/SCA/SCAI/SIR/STS/SVM Guidelines for the Diagnosis and Management of Patients With Thoracic Aortic Disease. Circulation. 2010; 121: e266-e369 2. 7 mm ground-glass nodule, superior segment right lower lobe, new since previous. Recommend attention on follow-up imaging. Electronically Signed   By: Corlis Leak M.D.   On: 03/30/2023 12:44    Microbiology: Results for orders placed or performed in visit on 12/03/22  WET PREP FOR TRICH, YEAST, CLUE      Status: None   Collection Time: 12/03/22  8:55 AM   Specimen: Urine   Urine  Result Value Ref Range Status   Trichomonas Exam Negative Negative Final   Yeast Exam Negative Negative Final   Clue Cell Exam Negative Negative Final  Urine Culture     Status: None   Collection Time: 12/03/22 11:08 AM   Specimen: Urine   UR  Result Value Ref Range Status   Urine Culture, Routine Final report  Final   Organism ID, Bacteria Comment  Final    Comment: Mixed urogenital flora Less than 10,000 colonies/mL     Labs: CBC: No results for input(s): "WBC", "NEUTROABS", "HGB", "HCT", "MCV", "PLT" in the last 168 hours.  Basic Metabolic Panel: No results for input(s): "NA", "K", "CL", "CO2", "GLUCOSE", "BUN", "CREATININE", "CALCIUM", "MG", "PHOS" in the last 168 hours.  Liver Function Tests: No results for input(s): "AST", "ALT", "ALKPHOS", "BILITOT", "PROT", "ALBUMIN" in the last 168 hours.  CBG: No results for input(s): "GLUCAP" in the last 168 hours.   Discharge time spent: greater than 30 minutes.  Signed: Lurene Shadow, MD [facility] 04/18/2023

## 2023-01-26 NOTE — Plan of Care (Signed)
Neurology plan of care  Interval data:  CTA H&N No hemodynamically-significant stenosis  Unable to undergo MRI 2/2 PM  Repeat head CT at 24 hrs was stable  TTE - no intracardiac clot or other potential etiologies  Stroke Labs     Component Value Date/Time   CHOL 170 01/26/2023 0443   CHOL 176 05/04/2022 0833   TRIG 62 01/26/2023 0443   HDL 71 01/26/2023 0443   HDL 80 05/04/2022 0833   CHOLHDL 2.4 01/26/2023 0443   VLDL 12 01/26/2023 0443   LDLCALC 87 01/26/2023 0443   LDLCALC 87 05/04/2022 0833   LABVLDL 9 05/04/2022 0833    Lab Results  Component Value Date/Time   HGBA1C 5.3 01/25/2023 08:02 AM   HGBA1C 5.1 04/20/2018 08:41 AM   HGBA1C 5.7 02/15/2012 01:21 PM   A/P: This is a 67 yo woman with hx throat cancer, remote DVT not on AC, history of spells of expressive aphasia and facial numbness that have always resolved, and is s/p pacemaker implant who presented with R facial weakness and R eye pain when looking to the right. She also reports R temporal headache as of late and some blurry vision. I suspect the R facial weakness was 2/2 TIA and that workup is completed. Her other sx are c/f possible GCA but her inflammatory markers were normal. This makes GCA much less likely although does not exclude it, therefore she is recommended to f/u with both neurology and ophtho after discharge.  - I will arrange outpatient neuro f/u - Please refer to outpatient ophtho - ASA 81mg  daily + plavix 75mg  daily x21 days f/b ASA 81mg  daily monotherapy after that - Atorvastatin 20mg  daily - OK from neuro standpoint to discharge patient  Bing Neighbors, MD [facility] [phone]

## 2023-02-04 ENCOUNTER — Ambulatory Visit: Payer: PPO | Admitting: Family Medicine

## 2023-02-14 ENCOUNTER — Encounter: Payer: Self-pay | Admitting: Family Medicine

## 2023-02-14 ENCOUNTER — Ambulatory Visit (INDEPENDENT_AMBULATORY_CARE_PROVIDER_SITE_OTHER): Payer: PPO | Admitting: Family Medicine

## 2023-02-14 VITALS — BP 136/76 | HR 76 | Temp 98.3°F | Wt 154.0 lb

## 2023-02-14 DIAGNOSIS — R29818 Other symptoms and signs involving the nervous system: Secondary | ICD-10-CM

## 2023-02-14 NOTE — Progress Notes (Signed)
BP 136/76   Pulse 76   Temp 98.3 F (36.8 C)   Wt 154 lb (69.9 kg)   SpO2 96%   BMI 27.28 kg/m    Subjective:    Patient ID: Wendy Kelly, female    DOB: 1955/10/21, 66 y.o.   MRN: [id number]  HPI: Wendy Kelly is a 67 y.o. female  Chief Complaint  Patient presents with   Hospitalization Follow-up   Transition of Care Hospital Follow up.   Hospital/Facility: [facility] D/C Physician: Dr. Myriam Forehand D/C Date: 01/26/23  Records Requested: 02/14/23 Records Received: 02/14/23 Records Reviewed: 02/14/23  Diagnoses on Discharge:   Stroke (patient denies- and no evidence in chart- trying to get removed)   TIA (transient ischemic attack)   Atrial fibrillation (HCC)   Hypothyroidism   Right sided temporal headache  Date of interactive Contact within 48 hours of discharge: NOT DONE Contact was through: N/A  Date of 7 day or 14 day face-to-face visit:  02/14/23  NOT within 14 days  Outpatient Encounter Medications as of 02/14/2023  Medication Sig   aspirin EC 81 MG tablet Take 1 tablet (81 mg total) by mouth daily. Swallow whole.   azelastine (ASTELIN) 0.1 % nasal spray SMARTSIG:1-2 Spray(s) Both Nares Every 12 Hours PRN   b complex vitamins tablet Take 2 tablets by mouth daily.   Calcium Carb-Cholecalciferol (CALCIUM 500 + D3 PO) Take 2 Doses by mouth daily.    CRANBERRY SOFT PO Take 2 each by mouth daily.    Digestive Enzymes (DIGESTIVE ENZYME PO) Take 1-2 capsules by mouth daily.   levothyroxine (SYNTHROID) 75 MCG tablet TAKE 1 TABLET BY MOUTHY DAILY   PRESCRIPTION MEDICATION Place 1 tablet vaginally 3 (three) times a week. E3 1mg  and Testosterone 1mg  compound   sodium chloride (MURO 128) 5 % ophthalmic solution Place 1 drop into both eyes daily.   [DISCONTINUED] atorvastatin (LIPITOR) 20 MG tablet Take 1 tablet (20 mg total) by mouth daily.   [DISCONTINUED] clopidogrel (PLAVIX) 75 MG tablet Take 1 tablet (75 mg total) by mouth daily for 21 days.   Facility-Administered Encounter  Medications as of 02/14/2023  Medication   0.9 %  sodium chloride infusion  Per Hospitalist: "[name] is a 67 y.o. female with medical history significant of TIA, history of Bell's palsy, hypothyroidism, depression with anxiety, A-fib not on anticoagulants, DVT not on anticoagulants, bicuspid aortic valve, ascending aortic aneurysm, throat cancer (s/p of radiation and chemotherapy), PPM, who presented to the hospital because of right facial droop, right temporal headache and inability to open the right eye normally.  She said the right side of her face felt weak, droopy and achy.  This was associated with mild right eye blurry vision.  She did not have any other symptoms.   She was admitted to the hospital for probable TIA.  Initial CT head and repeat CT head about 24 hours later did not show any evidence of acute stroke.  She was evaluated by the neurologist who recommended dual antiplatelet therapy with low-dose aspirin and Plavix for 21 days followed by low-dose aspirin monotherapy.  All her symptoms have resolved.  She is deemed stable for discharge to home today.  Discharge plan was discussed with the patient and her husband at the bedside."  Diagnostic Tests Reviewed:   CLINICAL DATA:  Code stroke.  Neuro deficit, acute, stroke suspected   EXAM: CT HEAD WITHOUT CONTRAST   TECHNIQUE: Contiguous axial images were obtained from the base of the skull through  the vertex without intravenous contrast.   RADIATION DOSE REDUCTION: This exam was performed according to the departmental dose-optimization program which includes automated exposure control, adjustment of the mA and/or kV according to patient size and/or use of iterative reconstruction technique.   COMPARISON:  None Available.   FINDINGS: Brain: No evidence of acute infarction, hemorrhage, hydrocephalus, extra-axial collection or mass lesion/mass effect.   Vascular: No hyperdense vessel.   Skull: No acute fracture.    Sinuses/Orbits: Clear sinuses.  No acute orbital findings.   Other: No mastoid effusions.   ASPECTS [facility] Stroke Program Early CT Score) total score (0-10 with 10 being normal): 10.   IMPRESSION: 1. No evidence of acute intracranial abnormality. 2. ASPECTS is 10.   Code stroke imaging results were communicated on 01/25/2023 at 8:26 am to provider [facility] via secure text paging.  CLINICAL DATA:  Neuro deficit with stroke suspected   EXAM: CT ANGIOGRAPHY HEAD AND NECK WITH AND WITHOUT CONTRAST   TECHNIQUE: Multidetector CT imaging of the head and neck was performed using the standard protocol during bolus administration of intravenous contrast. Multiplanar CT image reconstructions and MIPs were obtained to evaluate the vascular anatomy. Carotid stenosis measurements (when applicable) are obtained utilizing NASCET criteria, using the distal internal carotid diameter as the denominator.   RADIATION DOSE REDUCTION: This exam was performed according to the departmental dose-optimization program which includes automated exposure control, adjustment of the mA and/or kV according to patient size and/or use of iterative reconstruction technique.   CONTRAST:  75mL OMNIPAQUE IOHEXOL 350 MG/ML SOLN   COMPARISON:  Head CT from earlier today   FINDINGS: CTA NECK FINDINGS   Aortic arch: 4.2 cm diameter ascending aorta. Aneurysm of even greater size is known from a 2023 chest CTA, the aneurysm is under imaging surveillance. No dissection.   Right carotid system: Vessels are smoothly contoured and widely patent. No notable atheromatous changes especially for age.   Left carotid system: Vessels are smoothly contoured and widely patent. No notable atheromatous changes especially for age.   Vertebral arteries: Vessels are smoothly contoured and widely patent. No notable atheromatous changes especially for age.   Skeleton: Negative.   Other neck: Negative.   Upper chest: Clear  apical lungs.   Review of the MIP images confirms the above findings   CTA HEAD FINDINGS   Anterior circulation: Mild atheromatous plaque along the carotid siphons. No branch occlusion, beading, or aneurysm   Posterior circulation: The vertebral and basilar arteries are smoothly contoured and diffusely patent. No branch occlusion, beading, or aneurysm.   Venous sinuses: Diffusely patent   Anatomic variants: None significant   Review of the MIP images confirms the above findings   IMPRESSION: 1. No emergent finding. 2. Mild atherosclerosis. No flow limiting stenosis or irregularity of major arteries in the head and neck.  Narrative & Impression  CLINICAL DATA:  Provided history: Neuro deficit, acute, stroke suspected.   EXAM: CT HEAD WITHOUT CONTRAST   TECHNIQUE: Contiguous axial images were obtained from the base of the skull through the vertex without intravenous contrast.   RADIATION DOSE REDUCTION: This exam was performed according to the departmental dose-optimization program which includes automated exposure control, adjustment of the mA and/or kV according to patient size and/or use of iterative reconstruction technique.   COMPARISON:  Non-contrast head CT and CT angiogram head/neck 01/25/2023.   FINDINGS: Brain:   No age advanced or lobar predominant parenchymal atrophy.   There is no acute intracranial hemorrhage.   No demarcated  cortical infarct.   No extra-axial fluid collection.   No evidence of an intracranial mass.   No midline shift.   Vascular: No hyperdense vessel.  Atherosclerotic calcifications.   Skull: No calvarial fracture or aggressive osseous lesion.   Sinuses/Orbits: No mass or acute finding within the imaged orbits. No significant paranasal sinus disease at the imaged levels.   IMPRESSION: No evidence of an acute intracranial abnormality.   Disposition: Home  Consults:Neurology  Discharge Instructions:  Follow up with  PCP in 1 to 2 weeks Follow-up with neurologist in 1 month  Disease/illness Education: Discussed today  Home Health/Community Services Discussions/Referrals: N/A  Establishment or re-establishment of referral orders for community resources: N/A  Discussion with other health care providers: N/A  Assessment and Support of treatment regimen adherence: Good  Appointments Coordinated with: Patient  Education for self-management, independent living, and ADLs: Discussed today  Wendy Kelly notes that she was not having any weakness or drooping of her face when she went to the hospital, she was having a pulling feeling in her face on the R side of her face and her R eye. She was not able to open her R eye fully. Since getting out of the hospital, she notes that she has been doing OK. She has had 1 episode of pulling in her face when she got into an argument with her Mom. She has had a lot of stress taking care of her mom. No other symptoms since then. She feels like this was likely due to her stress. She did not take her plavix or the statin when she left the hospital and has not been taking either one. She is otherwise feeling well. No other concerns or complaints at this time.   Relevant past medical, surgical, family and social history reviewed and updated as indicated. Interim medical history since our last visit reviewed. Allergies and medications reviewed and updated.  Review of Systems  Constitutional: Negative.   Respiratory: Negative.    Cardiovascular: Negative.   Gastrointestinal: Negative.   Musculoskeletal: Negative.   Skin: Negative.   Neurological: Negative.   Hematological: Negative.   Psychiatric/Behavioral:  Negative for agitation, behavioral problems, confusion, decreased concentration, dysphoric mood, hallucinations, self-injury, sleep disturbance and suicidal ideas. The patient is nervous/anxious. The patient is not hyperactive.     Per HPI unless specifically indicated  above     Objective:    BP 136/76   Pulse 76   Temp 98.3 F (36.8 C)   Wt 154 lb (69.9 kg)   SpO2 96%   BMI 27.28 kg/m   Wt Readings from Last 3 Encounters:  02/14/23 154 lb (69.9 kg)  01/25/23 155 lb 9.6 oz (70.6 kg)  12/03/22 150 lb 9.6 oz (68.3 kg)    Physical Exam Vitals and nursing note reviewed.  Constitutional:      General: She is not in acute distress.    Appearance: Normal appearance. She is not ill-appearing, toxic-appearing or diaphoretic.  HENT:     Head: Normocephalic and atraumatic.     Right Ear: External ear normal.     Left Ear: External ear normal.     Nose: Nose normal.     Mouth/Throat:     Mouth: Mucous membranes are moist.     Pharynx: Oropharynx is clear.  Eyes:     General: No scleral icterus.       Right eye: No discharge.        Left eye: No discharge.     Extraocular Movements:  Extraocular movements intact.     Conjunctiva/sclera: Conjunctivae normal.     Pupils: Pupils are equal, round, and reactive to light.  Cardiovascular:     Rate and Rhythm: Normal rate and regular rhythm.     Pulses: Normal pulses.     Heart sounds: Normal heart sounds. No murmur heard.    No friction rub. No gallop.  Pulmonary:     Effort: Pulmonary effort is normal. No respiratory distress.     Breath sounds: Normal breath sounds. No stridor. No wheezing, rhonchi or rales.  Chest:     Chest wall: No tenderness.  Musculoskeletal:        General: Normal range of motion.     Cervical back: Normal range of motion and neck supple.  Skin:    General: Skin is warm and dry.     Capillary Refill: Capillary refill takes less than 2 seconds.     Coloration: Skin is not jaundiced or pale.     Findings: No bruising, erythema, lesion or rash.  Neurological:     General: No focal deficit present.     Mental Status: She is alert and oriented to person, place, and time. Mental status is at baseline.  Psychiatric:        Mood and Affect: Mood normal.        Behavior:  Behavior normal.        Thought Content: Thought content normal.        Judgment: Judgment normal.     Results for orders placed or performed during the hospital encounter of 01/25/23  Protime-INR  Result Value Ref Range   Prothrombin Time 13.3 11.4 - 15.2 seconds   INR 1.0 0.8 - 1.2  APTT  Result Value Ref Range   aPTT 28 24 - 36 seconds  CBC  Result Value Ref Range   WBC 4.7 4.0 - 10.5 K/uL   RBC 4.67 3.87 - 5.11 MIL/uL   Hemoglobin 14.1 12.0 - 15.0 g/dL   HCT 41.3 24.4 - 01.0 %   MCV 92.5 80.0 - 100.0 fL   MCH 30.2 26.0 - 34.0 pg   MCHC 32.6 30.0 - 36.0 g/dL   RDW 27.2 53.6 - 64.4 %   Platelets 147 (L) 150 - 400 K/uL   nRBC 0.0 0.0 - 0.2 %  Differential  Result Value Ref Range   Neutrophils Relative % 77 %   Neutro Abs 3.6 1.7 - 7.7 K/uL   Lymphocytes Relative 15 %   Lymphs Abs 0.7 0.7 - 4.0 K/uL   Monocytes Relative 6 %   Monocytes Absolute 0.3 0.1 - 1.0 K/uL   Eosinophils Relative 2 %   Eosinophils Absolute 0.1 0.0 - 0.5 K/uL   Basophils Relative 0 %   Basophils Absolute 0.0 0.0 - 0.1 K/uL   Immature Granulocytes 0 %   Abs Immature Granulocytes 0.01 0.00 - 0.07 K/uL  Comprehensive metabolic panel  Result Value Ref Range   Sodium 142 135 - 145 mmol/L   Potassium 3.6 3.5 - 5.1 mmol/L   Chloride 107 98 - 111 mmol/L   CO2 27 22 - 32 mmol/L   Glucose, Bld 98 70 - 99 mg/dL   BUN 20 8 - 23 mg/dL   Creatinine, Ser 0.34 0.44 - 1.00 mg/dL   Calcium 9.2 8.9 - 74.2 mg/dL   Total Protein 6.9 6.5 - 8.1 g/dL   Albumin 3.9 3.5 - 5.0 g/dL   AST 23 15 - 41 U/L   ALT  18 0 - 44 U/L   Alkaline Phosphatase 65 38 - 126 U/L   Total Bilirubin 0.9 0.3 - 1.2 mg/dL   GFR, Estimated >40 >34 mL/min   Anion gap 8 5 - 15  Urine Drug Screen, Qualitative  Result Value Ref Range   Tricyclic, Ur Screen NONE DETECTED NONE DETECTED   Amphetamines, Ur Screen NONE DETECTED NONE DETECTED   MDMA (Ecstasy)Ur Screen NONE DETECTED NONE DETECTED   Cocaine Metabolite,Ur [place] NONE DETECTED NONE  DETECTED   Opiate, Ur Screen NONE DETECTED NONE DETECTED   Phencyclidine (PCP) Ur S NONE DETECTED NONE DETECTED   Cannabinoid 50 Ng, Ur  NONE DETECTED NONE DETECTED   Barbiturates, Ur Screen NONE DETECTED NONE DETECTED   Benzodiazepine, Ur Scrn NONE DETECTED NONE DETECTED   Methadone Scn, Ur NONE DETECTED NONE DETECTED  Urinalysis, Routine w reflex microscopic -Urine, Clean Catch  Result Value Ref Range   Color, Urine YELLOW (A) YELLOW   APPearance HAZY (A) CLEAR   Specific Gravity, Urine 1.017 1.005 - 1.030   pH 6.0 5.0 - 8.0   Glucose, UA NEGATIVE NEGATIVE mg/dL   Hgb urine dipstick NEGATIVE NEGATIVE   Bilirubin Urine NEGATIVE NEGATIVE   Ketones, ur NEGATIVE NEGATIVE mg/dL   Protein, ur NEGATIVE NEGATIVE mg/dL   Nitrite NEGATIVE NEGATIVE   Leukocytes,Ua NEGATIVE NEGATIVE  Ethanol  Result Value Ref Range   Alcohol, Ethyl (B) <10 <10 mg/dL  Sedimentation rate  Result Value Ref Range   Sed Rate 8 0 - 30 mm/hr  C-reactive protein  Result Value Ref Range   CRP <0.5 <1.0 mg/dL  HIV Antibody (routine testing w rflx)  Result Value Ref Range   HIV Screen 4th Generation wRfx Non Reactive Non Reactive  Hemoglobin A1c  Result Value Ref Range   Hgb A1c MFr Bld 5.3 4.8 - 5.6 %   Mean Plasma Glucose 105.41 mg/dL  Lipid panel  Result Value Ref Range   Cholesterol 170 0 - 200 mg/dL   Triglycerides 62 <742 mg/dL   HDL 71 >59 mg/dL   Total CHOL/HDL Ratio 2.4 RATIO   VLDL 12 0 - 40 mg/dL   LDL Cholesterol 87 0 - 99 mg/dL  CBG monitoring, ED  Result Value Ref Range   Glucose-Capillary 95 70 - 99 mg/dL  ECHOCARDIOGRAM COMPLETE  Result Value Ref Range   Weight 2,368 oz   BP 125/84 mmHg   Ao pk vel 1.69 m/s   AV Area VTI 1.67 cm2   AR max vel 1.32 cm2   AV Mean grad 7.0 mmHg   AV Peak grad 11.4 mmHg   S' Lateral 2.80 cm   AV Area mean vel 1.38 cm2   Area-P 1/2 3.03 cm2   MV VTI 2.15 cm2   Est EF 55 - 60%       Assessment & Plan:   Problem List Items Addressed This Visit    None Visit Diagnoses     Transient neurologic deficit    -  Primary   No sign of stroke on imaging. Symptoms entirely resolved. Will get her back into see neurology. Continue to monitor. Call with any concerns.   Relevant Orders   Lyme Disease Serology w/Reflex   Rocky mtn spotted fvr abs pnl(IgG+IgM)   Ambulatory referral to Neurology        Follow up plan: Return for as scheduled.   25 minutes spent with patient today

## 2023-02-15 DIAGNOSIS — M545 Low back pain, unspecified: Secondary | ICD-10-CM | POA: Diagnosis not present

## 2023-02-18 ENCOUNTER — Encounter: Payer: Self-pay | Admitting: Family Medicine

## 2023-02-21 ENCOUNTER — Other Ambulatory Visit: Payer: Self-pay | Admitting: Family Medicine

## 2023-02-21 NOTE — Telephone Encounter (Signed)
Spoke with patient and advised that she must complete a Request for Amendment form and turn it in to the HIM department at [facility]. Patient has an appointment at [facility] on 02/22/23. Advised patient that I would contact [facility] on 02/22/23 to request that someone print and provide her with the form and where she will need to turn it in to request the stroke be removed from her medical record. Patient acknowledged understanding.

## 2023-02-22 ENCOUNTER — Other Ambulatory Visit: Payer: Self-pay | Admitting: Surgery

## 2023-02-22 ENCOUNTER — Encounter: Payer: Self-pay | Admitting: Internal Medicine

## 2023-02-22 ENCOUNTER — Ambulatory Visit: Payer: PPO | Attending: Internal Medicine | Admitting: Internal Medicine

## 2023-02-22 VITALS — BP 140/80 | HR 72 | Ht 63.0 in | Wt 155.0 lb

## 2023-02-22 DIAGNOSIS — I480 Paroxysmal atrial fibrillation: Secondary | ICD-10-CM

## 2023-02-22 DIAGNOSIS — I7121 Aneurysm of the ascending aorta, without rupture: Secondary | ICD-10-CM

## 2023-02-22 DIAGNOSIS — I441 Atrioventricular block, second degree: Secondary | ICD-10-CM | POA: Diagnosis not present

## 2023-02-22 NOTE — Patient Instructions (Signed)
Medication Instructions:  STOP Aspirin   *If you need a refill on your cardiac medications before your next appointment, please call your pharmacy*   Follow-Up: At [facility], you and your health needs are our priority.  As part of our continuing mission to provide you with exceptional heart care, we have created designated Provider Care Teams.  These Care Teams include your primary Cardiologist (physician) and Advanced Practice Providers (APPs -  Physician Assistants and Nurse Practitioners) who all work together to provide you with the care you need, when you need it.  We recommend signing up for the patient portal called "MyChart".  Sign up information is provided on this After Visit Summary.  MyChart is used to connect with patients for Virtual Visits (Telemedicine).  Patients are able to view lab/test results, encounter notes, upcoming appointments, etc.  Non-urgent messages can be sent to your provider as well.   To learn more about what you can do with MyChart, go to [email or web address].    Your next appointment:   12 month(s)  Provider:   Sherryl Manges, MD

## 2023-02-22 NOTE — Progress Notes (Signed)
Patient Care Team: Dorcas Carrow, DO as PCP - General (Family Medicine) Antonieta Iba, MD as Consulting Physician (Cardiology) Creig Hines, MD as Consulting Physician (Oncology) Bud Face, MD as Referring Physician (Otolaryngology) Carmina Miller, MD as Referring Physician (Radiation Oncology) Bridgett Larsson, LCSW as Social Worker (Licensed Clinical Social Worker)   HPI  Wendy Kelly is a 67 y.o. woman Seen in followup for a pacemaker implanted for syncope with intermittent second degree heart block. She received a Medtronic adapta device in 2006; generator replacement 11/16.      Hx of TIA-chart reviewed this event was 2012 described as "TIA like "facial droop has been described.  She thinks may be have been secondary to a temporally related flu vaccine shot, Ascending aortic aneurysm followed by Dr. BP  Date  Aorta  12/15 CTA 4.6  6/18 CTA 4.8  5/19 PET 4.3  7/20 CTA 4.8  8/22 CTA 4.6    Cancer therapy was complicated by DVT for which she took anticoagulation for 6 months.  Hospitalized 7/24 "with a stroke;" per the discharge summary; neurology consultation found no objective findings "resolved by the time of the visit "imaging was negative not withstanding, DAPT initiated however, the patient upon discharge felt like a stroke diagnosis was not made and she has stopped her DAP  The patient denies chest pain, shortness of breath, nocturnal dyspnea, orthopnea or peripheral edema.  There have been no palpitations, lightheadedness or syncope.  .   Remains stressful with her 14 year old mom, she still working full-time  DATE TEST EF   12/15 echo  50-55  mod ascending aortic dilitation 36 mm  1/16 myoview   EF 40-"normal"  no ischemia  1/18 Echo  55-60    7/24 55-60%  Ao 41 mm          Date Cr K Hgb  7/24 0.65 3.8 14.1          Past Medical History:  Diagnosis Date   AA (aortic aneurysm) (HCC)    a. 04/2011 Echo: Ao Root: 4.1cm, Asc Ao  4.7cm.   Anxiety    AV block, 2nd degree    a. 05/2005 - s/p MDT Adapta ADDR01 Dual Chamber PPM   Bicuspid aortic valve    Cancer (HCC)    Throat   Chest pain    a. 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   COVID-19 virus infection 09/11/2019   Deep vein thrombosis (DVT) of left lower extremity (HCC) 10/03/2017   Dehydration 08/11/2017   Depression    Expressive aphasia    a. ongoing since 04/2011 - seen by neurology - ? TIA vs. Migraine   Facial numbness    a. ongoing since 04/2011 - seen by neurology - ? TIA vs. 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 04/2011 Echo: EF 55-65%, No RWMA, Gr 1 DD.   Thyroid disease     Past Surgical History:  Procedure Laterality Date   ABDOMINAL WALL MESH  REMOVAL     bladder tack     cataract Bilateral 2004  CHOLECYSTECTOMY  2000   COLONOSCOPY WITH PROPOFOL N/A 06/26/2019   Procedure: COLONOSCOPY WITH PROPOFOL;  Surgeon: Midge Minium, MD;  Location: [facility] ENDOSCOPY;  Service: Endoscopy;  Laterality: N/A;   DILATION AND CURETTAGE OF UTERUS     DIRECT LARYNGOSCOPY Right 06/16/2017   Procedure: MICRO DIRECT LARYNGOSCOPY WITH BIOPSY OF RIGHT BASE OF TONGUE;  Surgeon: Bud Face, MD;  Location: [facility] ORS;  Service: ENT;  Laterality: Right;   EP IMPLANTABLE DEVICE N/A 06/04/2015   Procedure: PPM Generator Changeout;  Surgeon: Duke Salvia, MD;  Location[facility] INVASIVE CV LAB;  Service: Cardiovascular;  Laterality: N/A;   ESOPHAGOGASTRODUODENOSCOPY (EGD) WITH PROPOFOL N/A 12/18/2015   Procedure: ESOPHAGOGASTRODUODENOSCOPY (EGD) WITH gastric biopsy and dilation;  Surgeon: Midge Minium, MD;  Location: [facility];  Service: Endoscopy;   Laterality: N/A;   EYE SURGERY     INSERT / REPLACE / REMOVE PACEMAKER     IR FLUORO GUIDE PORT INSERTION RIGHT  06/22/2017   IR REMOVAL TUN ACCESS W/ PORT W/O FL MOD SED  04/14/2018   MOHS SURGERY  02/2018   PACEMAKER INSERTION  2006   Medtronic Adapta ADDR01   TONSILLECTOMY     TONSILLECTOMY     VAGINAL HYSTERECTOMY  2013   mad/cope    Current Outpatient Medications  Medication Sig Dispense Refill   aspirin EC 81 MG tablet Take 1 tablet (81 mg total) by mouth daily. Swallow whole.     azelastine (ASTELIN) 0.1 % nasal spray SMARTSIG:1-2 Spray(s) Both Nares Every 12 Hours PRN     b complex vitamins tablet Take 2 tablets by mouth daily.     Calcium Carb-Cholecalciferol (CALCIUM 500 + D3 PO) Take 2 Doses by mouth daily.      CRANBERRY SOFT PO Take 2 each by mouth daily.      Digestive Enzymes (DIGESTIVE ENZYME PO) Take 1-2 capsules by mouth daily.     levothyroxine (SYNTHROID) 75 MCG tablet TAKE 1 TABLET BY MOUTHY DAILY 90 tablet 1   PRESCRIPTION MEDICATION Place 1 tablet vaginally 3 (three) times a week. E3 1mg  and Testosterone 1mg  compound     sodium chloride (MURO 128) 5 % ophthalmic solution Place 1 drop into both eyes daily.     No current facility-administered medications for this visit.   Facility-Administered Medications Ordered in Other Visits  Medication Dose Route Frequency Provider Last Rate Last Admin   0.9 %  sodium chloride infusion   Intravenous Once Creig Hines, MD        Allergies  Allergen Reactions   Cefdinir Swelling    Swelling in mouth    Cephalosporins Swelling   Dairy Aid [Tilactase] Swelling    Milk and milk products cause swelling and tingling of tongue   Flagyl [Metronidazole] Swelling    Mouth and throat and ears go red and burn   Lactose Intolerance (Gi) Other (See Comments)    GI upset.  Eats only gluten free   Penicillins Rash    Rash/hives   Phenylephrine-Guaifenesin Anaphylaxis    Night terrors   Sulfa Antibiotics Rash and Hives     Other reaction(s): Unknown    Sulfonamide Derivatives Rash    Rash/itching.  From head to her toes, the itching was horrible. Penicillin is the worst   Tape Rash    Adhesives all cause a problem severely.  It doesn't matter if it is paper tape or not.   Dexilant [Dexlansoprazole]     Pressure in head like head is in a  vice and being squeezed   Entex Lq [Phenylephrine-Guaifenesin]     Night terrors   Other Other (See Comments)    Flu vaccine:  Weakness, dizziness, tia type symptoms.     Oxycodone Nausea And Vomiting    And itching   Succinylcholine Other (See Comments)    Trouble waking up   Vancomycin Rash   Clarithromycin Itching and Other (See Comments)   Haemophilus Influenzae Vaccines Other (See Comments)    Stroke like symptoms   Tetracycline Other (See Comments)    Unkown   Codeine Nausea And Vomiting   Guaifenesin & Derivatives Other (See Comments)    Night terrors   Lactobacillus Itching   Naproxen Sodium     Itching/rash   Pseudoephedrine Rash   Tetracyclines & Related Other (See Comments)    Was taking flagyl at the same time; unsure which caused the swelling  Swelling of throat. Pharmacist states that he believes it was the flagyl and not the tetracycline   Wheat Other (See Comments)    Upset stomach. Now eats gluten free    Review of Systems negative except from HPI and PMH  Physical Exam BP (!) 140/80   Pulse 72   Ht 5\' 3"  (1.6 m)   Wt 155 lb (70.3 kg)   SpO2 96%   BMI 27.46 kg/m  Well developed and well nourished in no acute distress HENT normal Neck supple with JVP-flat Clear Device pocket well healed; without hematoma or erythema.  There is no tethering  Regular rate and rhythm, no murmur Abd-soft with active BS No Clubbing cyanosis  edema Skin-warm and dry A & Oriented  Grossly normal sensory and motor function  ECG sinus at 72 Intervals 19/09/39 Axis rightward  Device function is normal. Programming changes   See Paceart for details      Assessment and  Plan  Syncope  Intermittent second degree heart block  Pacemaker-Medtronic   Ascending aortic aneurysm positive family history  TIA-like event 2012    Stress at home   No interval syncope.  Device function is normal with scant ventricular pacing  Have reached out to Dr. Lavinia Sharps concerning follow-up for her ascending aortic aneurysm  Discussed with her the neurological consultation and imaging from her recent hospitalization for the right eye pain.  There is concerns by neurology for GCA; CRP and sed rate were normal

## 2023-02-23 NOTE — Telephone Encounter (Signed)
Too soon last filled 11/22/22 #90, 1RF Requested Prescriptions  Pending Prescriptions Disp Refills   levothyroxine (SYNTHROID) 75 MCG tablet [Pharmacy Med Name: LEVOTHYROXINE SODIUM 75 MCG TAB] 90 tablet 1    Sig: TAKE 1 TABLET BY MOUTHY DAILY     Endocrinology:  Hypothyroid Agents Passed - 02/21/2023  1:53 PM      Passed - TSH in normal range and within 360 days    TSH  Date Value Ref Range Status  05/04/2022 3.910 0.450 - 4.500 uIU/mL Final         Passed - Valid encounter within last 12 months    Recent Outpatient Visits           1 week ago Transient neurologic deficit   [facility] Natalia, Megan P, DO   2 months ago Acute cystitis with hematuria   [facility] Larae Grooms, NP   3 months ago Acute cystitis with hematuria   [facility], Dorie Rank, NP   4 months ago Right lumbosacral radiculopathy   [facility] Loron, Ocie Bob, MD   6 months ago Patellofemoral arthralgia of right knee   [facility], Ocie Bob, MD       Future Appointments             In 3 weeks Gollan, Tollie Pizza, MD [facility] [place]   In 2 months Dorcas Carrow, DO [facility]

## 2023-02-25 ENCOUNTER — Encounter: Payer: Self-pay | Admitting: Internal Medicine

## 2023-03-04 DIAGNOSIS — M545 Low back pain, unspecified: Secondary | ICD-10-CM | POA: Diagnosis not present

## 2023-03-07 DIAGNOSIS — D2339 Other benign neoplasm of skin of other parts of face: Secondary | ICD-10-CM | POA: Diagnosis not present

## 2023-03-07 DIAGNOSIS — L905 Scar conditions and fibrosis of skin: Secondary | ICD-10-CM | POA: Diagnosis not present

## 2023-03-07 DIAGNOSIS — D0439 Carcinoma in situ of skin of other parts of face: Secondary | ICD-10-CM | POA: Diagnosis not present

## 2023-03-08 ENCOUNTER — Ambulatory Visit (INDEPENDENT_AMBULATORY_CARE_PROVIDER_SITE_OTHER): Payer: PPO

## 2023-03-08 ENCOUNTER — Encounter: Payer: Self-pay | Admitting: Family Medicine

## 2023-03-08 DIAGNOSIS — I441 Atrioventricular block, second degree: Secondary | ICD-10-CM | POA: Diagnosis not present

## 2023-03-08 LAB — CUP PACEART REMOTE DEVICE CHECK
Battery Impedance: 486 Ohm
Battery Remaining Longevity: 102 mo
Battery Voltage: 2.78 V
Brady Statistic AP VP Percent: 0 %
Brady Statistic AP VS Percent: 11 %
Brady Statistic AS VP Percent: 0 %
Brady Statistic AS VS Percent: 89 %
Date Time Interrogation Session: 20240827050825
Implantable Lead Connection Status: 753985
Implantable Lead Connection Status: 753985
Implantable Lead Implant Date: 20061128
Implantable Lead Implant Date: 20061128
Implantable Lead Location: 753859
Implantable Lead Location: 753860
Implantable Lead Model: 4092
Implantable Lead Model: 4592
Implantable Pulse Generator Implant Date: 20161123
Lead Channel Impedance Value: 547 Ohm
Lead Channel Impedance Value: 737 Ohm
Lead Channel Pacing Threshold Amplitude: 0.5 V
Lead Channel Pacing Threshold Amplitude: 1.125 V
Lead Channel Pacing Threshold Pulse Width: 0.4 ms
Lead Channel Pacing Threshold Pulse Width: 0.4 ms
Lead Channel Setting Pacing Amplitude: 2 V
Lead Channel Setting Pacing Amplitude: 2.5 V
Lead Channel Setting Pacing Pulse Width: 0.4 ms
Lead Channel Setting Sensing Sensitivity: 2.8 mV
Zone Setting Status: 755011
Zone Setting Status: 755011

## 2023-03-15 DIAGNOSIS — M545 Low back pain, unspecified: Secondary | ICD-10-CM | POA: Diagnosis not present

## 2023-03-15 NOTE — Progress Notes (Signed)
Date:  03/18/2023   ID:  [name], DOB 01/01/1956, MRN [id number]  Patient Location:  [address] [place] [address]   Provider location:   [facility], [place] office  PCP:  Dorcas Carrow, DO  Cardiologist:  Fonnie Mu   Chief Complaint  Patient presents with   12 month follow up     "Doing well." Medications reviewed by the patient verbally.     History of Present Illness:    Aleesia Rajagopalan is a 67 y.o. 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 that has been monitored with echocardiography and CT scans, No significant change since 2010 Syncope, second-degree AV block  Medtronic adapta device in 2006; generator replacement 11/16.   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 No change over the past 5 years.   Echocardiogram July 2024 EF 55 to 60% Normal RV size and function Trivial aortic valve regurgitation, no significant stenosis Aorta measured 41 mm  Admission to hospital July 2024 for TIA Initial CT head and repeat CT head about 24 hours later did not show any evidence of acute stroke. She was evaluated by the neurologist who recommended dual antiplatelet therapy with low-dose aspirin and Plavix for 21 days followed by low-dose aspirin monotherapy.  Felt it was stress  Seen by EP 8/24  Significant stress at home, Mom with fall,. weaker care taker burnout, cares for mom, 24 /7 Gets a sitter, 3 days a week  No chest pain, no  significant shortness of breath on exertion  EKG personally reviewed by myself on todays visit EKG Interpretation Date/Time:  Friday March 18 2023 09:15:18 EDT Ventricular Rate:  68 PR Interval:  176 QRS Duration:  96 QT Interval:  410 QTC Calculation: 435 R Axis:   -54  Text Interpretation: Normal sinus rhythm Left anterior fascicular block When compared with ECG of 22-Feb-2023 09:25, No significant change was found Confirmed by Julien Nordmann [phone]) on 03/18/2023 9:30:16 AM   Other past medical history reviewed Last CT chest August 2022 Scheduled for repeat CT chest next week, follow-up with CT surgery on that day  Long history of low blood pressure In the past she has declined midodrine  If she does have lightheaded spells she increases her salt intake on fluids   covid 09/2019: had infusion Mom and husband had it as well Had antibody infusion at [facility],  Feels blood pressure was lower following infusion  CT chest 02/05/2019 Stable dilatation of the ascending thoracic aorta with a maximum transverse diameter 4.8 x 4.8 cm. No evident dissection. 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  Treated  with Xarelto  Last CT chest December 2015, aorta 4.6 cm  No change compared to CT scan August 2011, at that time 4.6 x 4.8 cm  Son diagnosed with dilated ascending aorta 4.2 cm, he is out of [place]   CT scan in November 2013.  4.7 cm ascending aorta at that time   Past Medical History:  Diagnosis Date   AA (aortic aneurysm) (HCC)    a. 04/2011 Echo: Ao Root: 4.1cm, Asc Ao 4.7cm.   Anxiety    AV block, 2nd degree    a. 05/2005 - s/p  MDT Adapta ADDR01 Dual Chamber PPM   Bicuspid aortic valve    Cancer (HCC)    Throat   Chest pain    a. 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   COVID-19 virus infection 09/11/2019   Deep vein thrombosis (DVT) of left lower extremity (HCC) 10/03/2017   Dehydration 08/11/2017   Depression    Expressive aphasia    a. ongoing since 04/2011 - seen by neurology - ? TIA vs. Migraine   Facial numbness    a. ongoing since 04/2011 - seen by neurology - ? TIA vs. 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 04/2011 Echo: EF 55-65%, No RWMA, Gr 1 DD.   Thyroid disease    Past Surgical History:  Procedure Laterality Date   ABDOMINAL WALL MESH  REMOVAL     bladder tack     cataract Bilateral 2004   CHOLECYSTECTOMY  2000   COLONOSCOPY WITH PROPOFOL N/A 06/26/2019   Procedure: COLONOSCOPY WITH PROPOFOL;  Surgeon: Midge Minium, MD;  Location: [facility] ENDOSCOPY;  Service: Endoscopy;  Laterality: N/A;   DILATION AND CURETTAGE OF UTERUS     DIRECT LARYNGOSCOPY Right 06/16/2017   Procedure: MICRO DIRECT LARYNGOSCOPY WITH BIOPSY OF RIGHT BASE OF TONGUE;  Surgeon: Bud Face, MD;  Location: [facility] ORS;  Service: ENT;  Laterality: Right;   EP IMPLANTABLE DEVICE N/A 06/04/2015   Procedure: PPM Generator Changeout;  Surgeon: Duke Salvia, MD;  Location: [facility] INVASIVE CV LAB;  Service: Cardiovascular;  Laterality: N/A;   ESOPHAGOGASTRODUODENOSCOPY (EGD) WITH PROPOFOL N/A 12/18/2015   Procedure: ESOPHAGOGASTRODUODENOSCOPY (EGD) WITH gastric biopsy and dilation;  Surgeon: Midge Minium, MD;  Location: [facility];  Service: Endoscopy;  Laterality: N/A;   EYE SURGERY     INSERT / REPLACE / REMOVE  PACEMAKER     IR FLUORO GUIDE PORT INSERTION RIGHT  06/22/2017   IR REMOVAL TUN ACCESS W/ PORT W/O FL MOD SED  04/14/2018   MOHS SURGERY  02/2018   PACEMAKER INSERTION  2006   Medtronic Adapta ADDR01   TONSILLECTOMY     TONSILLECTOMY     VAGINAL HYSTERECTOMY  2013   mad/cope      Allergies:   Cefdinir, Cephalosporins, Dairy aid [tilactase], Flagyl [metronidazole], Lactose intolerance (gi), Penicillins, Phenylephrine-guaifenesin, Sulfa antibiotics, Sulfonamide derivatives, Tape, Dexilant [dexlansoprazole], Entex lq [phenylephrine-guaifenesin], Other, Oxycodone, Succinylcholine, Vancomycin, Clarithromycin, Haemophilus influenzae vaccines, Tetracycline, Codeine, Guaifenesin & derivatives, Lactobacillus, Naproxen sodium, Pseudoephedrine, Tetracyclines & related, and Wheat   Social History   Tobacco Use   Smoking  status: Never   Smokeless tobacco: Never   Tobacco comments:    tobacco use -no  Vaping Use   Vaping status: Never Used  Substance Use Topics   Alcohol use: No   Drug use: No     Current Outpatient Medications on File Prior to Visit  Medication Sig Dispense Refill   azelastine (ASTELIN) 0.1 % nasal spray SMARTSIG:1-2 Spray(s) Both Nares Every 12 Hours PRN     b complex vitamins tablet Take 2 tablets by mouth daily.     Calcium Carb-Cholecalciferol (CALCIUM 500 + D3 PO) Take 2 Doses by mouth daily.      CRANBERRY SOFT PO Take 2 each by mouth daily.      Digestive Enzymes (DIGESTIVE ENZYME PO) Take 1-2 capsules by mouth daily.     levothyroxine (SYNTHROID) 75 MCG tablet TAKE 1 TABLET BY MOUTHY DAILY 90 tablet 1   PRESCRIPTION MEDICATION Place 1 tablet vaginally 3 (three) times a week. E3 1mg  and Testosterone 1mg  compound     sodium chloride (MURO 128) 5 % ophthalmic solution Place 1 drop into both eyes daily.     Current Facility-Administered Medications on File Prior to Visit  Medication Dose Route Frequency Provider Last Rate Last Admin   0.9 %  sodium chloride infusion    Intravenous Once Creig Hines, MD         Family Hx: The patient's family history includes Atrial fibrillation in her mother; Breast cancer (age of onset: 36) in her maternal aunt; COPD in her maternal grandmother and mother; Stroke in her father. There is no history of Ovarian cancer or Diabetes.  ROS:   Please see the history of present illness.    Review of Systems  Constitutional: Negative.   HENT: Negative.    Respiratory: Negative.    Cardiovascular: Negative.   Gastrointestinal: Negative.   Musculoskeletal: Negative.   Neurological: Negative.   Psychiatric/Behavioral: Negative.    All other systems reviewed and are negative.   Labs/Other Tests and Data Reviewed:    Recent Labs: 05/04/2022: TSH 3.910 01/25/2023: ALT 18; BUN 20; Creatinine, Ser 0.65; Hemoglobin 14.1; Platelets 147; Potassium 3.6; Sodium 142   Recent Lipid Panel Lab Results  Component Value Date/Time   CHOL 170 01/26/2023 04:43 AM   CHOL 176 05/04/2022 08:33 AM   TRIG 62 01/26/2023 04:43 AM   HDL 71 01/26/2023 04:43 AM   HDL 80 05/04/2022 08:33 AM   CHOLHDL 2.4 01/26/2023 04:43 AM   LDLCALC 87 01/26/2023 04:43 AM   LDLCALC 87 05/04/2022 08:33 AM    Wt Readings from Last 3 Encounters:  03/18/23 155 lb 2 oz (70.4 kg)  02/22/23 155 lb (70.3 kg)  02/14/23 154 lb (69.9 kg)     Exam:    Vital Signs: Vital signs may also be detailed in the HPI BP 100/60 (BP Location: Left Arm, Patient Position: Sitting, Cuff Size: Normal)   Pulse 68   Ht 5\' 3"  (1.6 m)   Wt 155 lb 2 oz (70.4 kg)   SpO2 98%   BMI 27.48 kg/m   Constitutional:  oriented to person, place, and time. No distress.  HENT:  Head: Grossly normal Eyes:  no discharge. No scleral icterus.  Neck: No JVD, no carotid bruits  Cardiovascular: Regular rate and rhythm, no murmurs appreciated Pulmonary/Chest: Clear to auscultation bilaterally, no wheezes or rails Abdominal: Soft.  no distension.  no tenderness.  Musculoskeletal: Normal range of  motion Neurological:  normal muscle tone. Coordination normal. No atrophy  Skin: Skin warm and dry Psychiatric: normal affect, pleasant   ASSESSMENT & PLAN:    Problem List Items Addressed This Visit       Cardiology Problems   Ascending aortic aneurysm (HCC)   Relevant Orders   EKG 12-Lead (Completed)   AV block, 2nd degree   Relevant Orders   EKG 12-Lead (Completed)   Orthostatic hypotension     Other   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onic   Relevant Orders   EKG 12-Lead (Completed)   Other Visit Diagnoses     Chest pain of uncertain etiology       Aortic valve disorder       Relevant Orders   EKG 12-Lead (Completed)      Dilated ascending aorta Previously with stable aorta size 4.8 cm Repeat CT scan scheduled this fall with CT  surgery follow-up No changes to medications Difficult to determine if she is bicuspid or tricuspid For worsening aorta dilation, consider TEE   Chest pain None recently, atypical in nature No further workup at this time  Shortness of breath Denies significant symptoms, exercising on a regular basis  Orthostasis Previously declined Florinef, midodrine, prefers to manage this with high fluid and salt intake    Total encounter time more than 30 minutes  Greater than 50% was spent in counseling and coordination of care with the patient    Signed, Julien Nordmann, MD  [facility] [address] [facility] [address], [place], [place] [address]

## 2023-03-18 ENCOUNTER — Ambulatory Visit: Payer: PPO | Attending: Cardiovascular Disease | Admitting: Cardiovascular Disease

## 2023-03-18 ENCOUNTER — Encounter: Payer: Self-pay | Admitting: Cardiovascular Disease

## 2023-03-18 DIAGNOSIS — Z950 Presence of cardiac pacemaker: Secondary | ICD-10-CM

## 2023-03-18 DIAGNOSIS — I359 Nonrheumatic aortic valve disorder, unspecified: Secondary | ICD-10-CM | POA: Diagnosis not present

## 2023-03-18 DIAGNOSIS — R079 Chest pain, unspecified: Secondary | ICD-10-CM | POA: Diagnosis not present

## 2023-03-18 DIAGNOSIS — I7121 Aneurysm of the ascending aorta, without rupture: Secondary | ICD-10-CM | POA: Diagnosis not present

## 2023-03-18 DIAGNOSIS — I441 Atrioventricular block, second degree: Secondary | ICD-10-CM | POA: Diagnosis not present

## 2023-03-18 DIAGNOSIS — I951 Orthostatic hypotension: Secondary | ICD-10-CM

## 2023-03-18 NOTE — Patient Instructions (Signed)

## 2023-03-21 DIAGNOSIS — M545 Low back pain, unspecified: Secondary | ICD-10-CM | POA: Diagnosis not present

## 2023-03-22 NOTE — Progress Notes (Signed)
Remote pacemaker transmission.   

## 2023-03-30 ENCOUNTER — Ambulatory Visit: Payer: PPO | Admitting: Surgery

## 2023-03-30 ENCOUNTER — Ambulatory Visit
Admission: RE | Admit: 2023-03-30 | Discharge: 2023-03-30 | Disposition: A | Discharge status: Home / Self Care | Payer: PPO | Source: Ambulatory Visit | Attending: Surgery | Admitting: Surgery

## 2023-03-30 ENCOUNTER — Encounter: Payer: Self-pay | Admitting: Surgery

## 2023-03-30 VITALS — BP 119/79 | HR 71 | Resp 18 | Ht 63.0 in | Wt 150.0 lb

## 2023-03-30 DIAGNOSIS — I7121 Aneurysm of the ascending aorta, without rupture: Secondary | ICD-10-CM

## 2023-03-30 MED ORDER — IOPAMIDOL (ISOVUE-370) INJECTION 76%
75.0000 mL | Freq: Once | INTRAVENOUS | Status: AC | PRN
  Administered 2023-03-30: 75 mL via INTRAVENOUS

## 2023-03-30 NOTE — Progress Notes (Signed)
HPI:  The patient is a 67 year old woman who returns today for follow-up of a 4.6 to 4.8 cm fusiform ascending aortic aneurysm with a trileaflet aortic valve. She denies any chest pain or shortness of breath. Her last 2D echocardiogram on 01/25/2023 showed no aortic stenosis and trivial aortic regurgitation.   Current Outpatient Medications  Medication Sig Dispense Refill   azelastine (ASTELIN) 0.1 % nasal spray SMARTSIG:1-2 Spray(s) Both Nares Every 12 Hours PRN     b complex vitamins tablet Take 2 tablets by mouth daily.     Calcium Carb-Cholecalciferol (CALCIUM 500 + D3 PO) Take 2 Doses by mouth daily.      CRANBERRY SOFT PO Take 2 each by mouth daily.      Digestive Enzymes (DIGESTIVE ENZYME PO) Take 1-2 capsules by mouth daily.     levothyroxine (SYNTHROID) 75 MCG tablet TAKE 1 TABLET BY MOUTHY DAILY 90 tablet 1   PRESCRIPTION MEDICATION Place 1 tablet vaginally 3 (three) times a week. E3 1mg  and Testosterone 1mg  compound     sodium chloride (MURO 128) 5 % ophthalmic solution Place 1 drop into both eyes daily.     No current facility-administered medications for this visit.   Facility-Administered Medications Ordered in Other Visits  Medication Dose Route Frequency Provider Last Rate Last Admin   0.9 %  sodium chloride infusion   Intravenous Once Creig Hines, MD         Physical Exam: BP 119/79 (BP Location: Left Arm, Patient Position: Sitting)   Pulse 71   Resp 18   Ht 5\' 3"  (1.6 m)   Wt 150 lb (68 kg)   SpO2 97% Comment: RA  BMI 26.57 kg/m  She looks well. Cardiac exam shows a regular rate and rhythm with normal heart sounds.  There is no murmur. Lungs are clear. There is no peripheral edema.  Diagnostic Tests:  Narrative & Impression  CLINICAL DATA:  Thoracic aortic aneurysm, follow-up, currently asymptomatic   EXAM: CT ANGIOGRAPHY CHEST WITH CONTRAST   TECHNIQUE: Multidetector CT imaging of the chest was performed using the standard protocol during  bolus administration of intravenous contrast. Multiplanar CT image reconstructions and MIPs were obtained to evaluate the vascular anatomy.   RADIATION DOSE REDUCTION: This exam was performed according to the departmental dose-optimization program which includes automated exposure control, adjustment of the mA and/or kV according to patient size and/or use of iterative reconstruction technique.   CONTRAST:  75mL ISOVUE-370 IOPAMIDOL (ISOVUE-370) INJECTION 76%   COMPARISON:  03/24/2022   FINDINGS: Cardiovascular: SVC patent. Stable left subclavian dual lead pacemaker. Heart size normal. No pericardial effusion. Satisfactory opacification of pulmonary arteries noted, and there is no evidence of pulmonary emboli. Aortic valve leaflet calcifications. Good contrast opacification of the thoracic aorta, without dissection or stenosis. Classic 3-vessel brachiocephalic arterial origin anatomy without proximal stenosis.   Aortic Root:   --Valve: 2.4 cm   --Sinuses: 4.1 cm   --Sinotubular Junction: 3.7 cm   Limitations by motion: Moderate   Thoracic Aorta:   --Ascending Aorta: 4.8 cm (previously 4.6)   --Aortic Arch: 3.7 cm   --Descending Aorta: 2.5 cm   Mediastinum/Nodes: No  hematoma, mass, or adenopathy.   Lungs/Pleura: No pleural effusion. No pneumothorax. 7 mm ground-glass nodule, superior segment right lower lobe, new since previous.   Upper Abdomen: No acute findings.   Musculoskeletal: No chest wall abnormality. No acute or significant osseous findings.   Review of the MIP images confirms the above findings.  IMPRESSION: 1. 4.8 cm ascending thoracic aortic aneurysm, previously 4.6 cm. Recommend semi-annual imaging followup by CTA or MRA and referral to cardiothoracic surgery if not already obtained. This recommendation follows 2010 ACCF/AHA/AATS/ACR/ASA/SCA/SCAI/SIR/STS/SVM Guidelines for the Diagnosis and Management of Patients With Thoracic Aortic Disease.  Circulation. 2010; 121: e266-e369 2. 7 mm ground-glass nodule, superior segment right lower lobe, new since previous. Recommend attention on follow-up imaging.     Electronically Signed   By: Corlis Leak M.D.   On: 03/30/2023 12:44      Impression:  She has a stable 4.8 cm fusiform ascending aortic aneurysm which has measured between 4.6 and 4.8 cm on multiple CT scans since at least 2011.  I do not think there has been any significant change.  This is still below the surgical threshold of 5.5 cm.  I reviewed the CTA images with her and answered her questions.  I stressed the importance of continued good blood pressure control in preventing further enlargement and acute aortic dissection.  Plan:  I will see her back in 1 year with a CTA of the chest.  I spent 15 minutes performing this established patient evaluation and > 50% of this time was spent face to face counseling and coordinating the care of this patient's aortic aneurysm.    Alleen Borne, MD [facility] [phone]

## 2023-04-04 DIAGNOSIS — M545 Low back pain, unspecified: Secondary | ICD-10-CM | POA: Diagnosis not present

## 2023-04-29 DIAGNOSIS — M545 Low back pain, unspecified: Secondary | ICD-10-CM | POA: Diagnosis not present

## 2023-05-09 DIAGNOSIS — L923 Foreign body granuloma of the skin and subcutaneous tissue: Secondary | ICD-10-CM | POA: Diagnosis not present

## 2023-05-10 ENCOUNTER — Ambulatory Visit (INDEPENDENT_AMBULATORY_CARE_PROVIDER_SITE_OTHER): Payer: PPO | Admitting: Family Medicine

## 2023-05-10 ENCOUNTER — Encounter: Payer: Self-pay | Admitting: Family Medicine

## 2023-05-10 VITALS — BP 102/72 | HR 77 | Ht 64.0 in | Wt 154.2 lb

## 2023-05-10 DIAGNOSIS — D692 Other nonthrombocytopenic purpura: Secondary | ICD-10-CM | POA: Insufficient documentation

## 2023-05-10 DIAGNOSIS — Z0000 Encounter for general adult medical examination without abnormal findings: Secondary | ICD-10-CM | POA: Diagnosis not present

## 2023-05-10 DIAGNOSIS — Z1231 Encounter for screening mammogram for malignant neoplasm of breast: Secondary | ICD-10-CM

## 2023-05-10 DIAGNOSIS — E039 Hypothyroidism, unspecified: Secondary | ICD-10-CM

## 2023-05-10 NOTE — Progress Notes (Signed)
BP 102/72   Pulse 77   Ht 5\' 4"  (1.626 m)   Wt 154 lb 3.2 oz (69.9 kg)   SpO2 90%   BMI 26.47 kg/m    Subjective:    Patient ID: Wendy Kelly, female    DOB: 07-15-1955, 67 y.o.   MRN: [id number]  HPI: Wendy Kelly is a 67 y.o. female presenting on 05/10/2023 for comprehensive medical examination. Current medical complaints include:  HYPOTHYROIDISM Thyroid control status:stable Satisfied with current treatment? unsure Medication side effects: no Medication compliance: excellent compliance Etiology of hypothyroidism:  Recent dose adjustment:no Fatigue: yes Cold intolerance: no Heat intolerance: no Weight gain: no Weight loss: no Constipation: no Diarrhea/loose stools: no Palpitations: no Lower extremity edema: no Anxiety/depressed mood: no  Menopausal Symptoms: no  Depression Screen done today and results listed below:     05/10/2023    8:16 AM 02/14/2023   10:09 AM 08/02/2022    4:25 PM 06/15/2022   11:39 AM 05/04/2022    8:09 AM  Depression screen PHQ 2/9  Decreased Interest 0 0 0 0 0  Down, Depressed, Hopeless 0 0 0 0 0  PHQ - 2 Score 0 0 0 0 0  Altered sleeping  0 1 2 0  Tired, decreased energy  0 1 0 3  Change in appetite  0 0 0 1  Feeling bad or failure about yourself   0 0 0 0  Trouble concentrating  0 0 0 0  Moving slowly or fidgety/restless  0 0 0 0  Suicidal thoughts  0 0 0 0  PHQ-9 Score  0 2 2 4   Difficult doing work/chores  Not difficult at all Not difficult at all Not difficult at all Not difficult at all    Past Medical History:  Past Medical History:  Diagnosis Date   AA (aortic aneurysm) (HCC)    a. 04/2011 Echo: Ao Root: 4.1cm, Asc Ao 4.7cm.   Anxiety    AV block, 2nd degree    a. 05/2005 - s/p MDT Adapta ADDR01 Dual Chamber PPM   Bicuspid aortic valve    Cancer (HCC)    Throat   Chest pain    a. 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   COVID-19 virus infection 09/11/2019   Deep vein thrombosis (DVT) of left lower extremity (HCC) 10/03/2017    Dehydration 08/11/2017   Depression    Expressive aphasia    a. ongoing since 04/2011 - seen by neurology - ? TIA vs. Migraine   Facial numbness    a. ongoing since 04/2011 - seen by neurology - ? TIA vs. 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 04/2011 Echo: EF 55-65%, No RWMA, Gr 1 DD.   Thyroid disease     Surgical History:  Past Surgical History:  Procedure Laterality Date   ABDOMINAL WALL MESH  REMOVAL     bladder tack     cataract Bilateral 2004   CHOLECYSTECTOMY  2000   COLONOSCOPY WITH PROPOFOL N/A 06/26/2019   Procedure: COLONOSCOPY WITH PROPOFOL;  Surgeon: Midge Minium, MD;  Location: [facility] END[facility]COPY;  Service:  Endoscopy;  Laterality: N/A;   DILATION AND CURETTAGE OF UTERUS     DIRECT LARYNGOSCOPY Right 06/16/2017   Procedure: MICRO DIRECT LARYNGOSCOPY WITH BIOPSY OF RIGHT BASE OF TONGUE;  Surgeon: Bud Face, MD;  Location: [facility] ORS;  Service: ENT;  Laterality: Right;   EP IMPLANTABLE DEVICE N/A 06/04/2015   Procedure: PPM Generator Changeout;  Surgeon: Duke Salvia, MD;  Location: [facility] INVASIVE CV LAB;  Service: Cardiovascular;  Laterality: N/A;   ESOPHAGOGASTRODUODENOSCOPY (EGD) WITH PROPOFOL N/A 12/18/2015   Procedure: ESOPHAGOGASTRODUODENOSCOPY (EGD) WITH gastric biopsy and dilation;  Surgeon: Midge Minium, MD;  Location: [facility];  Service: Endoscopy;  Laterality: N/A;   EYE SURGERY     INSERT / REPLACE / REMOVE PACEMAKER     IR FLUORO GUIDE PORT INSERTION RIGHT  06/22/2017   IR REMOVAL TUN ACCESS W/ PORT W/O FL MOD SED  04/14/2018   MOHS SURGERY  02/2018   PACEMAKER INSERTION  2006   Medtronic Adapta ADDR01   TONSILLECTOMY      TONSILLECTOMY     VAGINAL HYSTERECTOMY  2013   mad/cope    Medications:  Current Outpatient Medications on File Prior to Visit  Medication Sig   azelastine (ASTELIN) 0.1 % nasal spray SMARTSIG:1-2 Spray(s) Both Nares Every 12 Hours PRN   b complex vitamins tablet Take 2 tablets by mouth daily.   Calcium Carb-Cholecalciferol (CALCIUM 500 + D3 PO) Take 2 Doses by mouth daily.    CRANBERRY SOFT PO Take 2 each by mouth daily.    Digestive Enzymes (DIGESTIVE ENZYME PO) Take 1-2 capsules by mouth daily.   levothyroxine (SYNTHROID) 75 MCG tablet TAKE 1 TABLET BY MOUTHY DAILY   PRESCRIPTION MEDICATION Place 1 tablet vaginally 3 (three) times a week. E3 1mg  and Testosterone 1mg  compound   sodium chloride (MURO 128) 5 % ophthalmic solution Place 1 drop into both eyes daily.   Current Facility-Administered Medications on File Prior to Visit  Medication   0.9 %  sodium chloride infusion    Allergies:  Allergies  Allergen Reactions   Cefdinir Swelling    Swelling in mouth    Cephalosporins Swelling   Dairy Aid [Tilactase] Swelling    Milk and milk products cause swelling and tingling of tongue   Flagyl [Metronidazole] Swelling    Mouth and throat and ears go red and burn   Lactose Intolerance (Gi) Other (See Comments)    GI upset.  Eats only gluten free   Penicillins Rash    Rash/hives   Phenylephrine-Guaifenesin Anaphylaxis    Night terrors   Sulfa Antibiotics Rash and Hives    Other reaction(s): Unknown    Sulfonamide Derivatives Rash    Rash/itching.  From head to her toes, the itching was horrible. Penicillin is the worst   Tape Rash    Adhesives all cause a problem severely.  It doesn't matter if it is paper tape or not.   Dexilant [Dexlansoprazole]     Pressure in head like head is in a vice and being squeezed   Entex Lq [Phenylephrine-Guaifenesin]     Night terrors   Other Other (See Comments)    Flu vaccine:  Weakness, dizziness, tia type symptoms.     Oxycodone  Nausea And Vomiting    And itching   Succinylcholine Other (See Comments)    Trouble waking up   Vancomycin Rash   Clarithromycin Itching and Other (See Comments)   Haemophilus Influenzae Vaccines Other (See Comments)    Stroke like symptoms  Tetracycline Other (See Comments)    Unkown   Codeine Nausea And Vomiting   Guaifenesin & Derivatives Other (See Comments)    Night terrors   Lactobacillus Itching   Naproxen Sodium     Itching/rash   Pseudoephedrine Rash   Tetracyclines & Related Other (See Comments)    Was taking flagyl at the same time; unsure which caused the swelling  Swelling of throat. Pharmacist states that he believes it was the flagyl and not the tetracycline   Wheat Other (See Comments)    Upset stomach. Now eats gluten free    Social History:  Social History   Socioeconomic History   Marital status: Married    Spouse name: Not on file   Number of children: Not on file   Years of education: Not on file   Highest education level: Not on file  Occupational History   Not on file  Tobacco Use   Smoking status: Never   Smokeless tobacco: Never   Tobacco comments:    tobacco use -no  Vaping Use   Vaping status: Never Used  Substance and Sexual Activity   Alcohol use: No   Drug use: No   Sexual activity: Yes    Birth control/protection: Surgical  Other Topics Concern   Not on file  Social History Narrative   Lives in [place] with husband.  Works out regularly.  Works as a Teacher, adult education - owns own business.    Social Determinants of Health   Financial Resource Strain: Low Risk  (06/15/2022)   Overall Financial Resource Strain (CARDIA)    Difficulty of Paying Living Expenses: Not hard at all  Food Insecurity: No Food Insecurity (01/25/2023)   Hunger Vital Sign    Worried About Running Out of Food in the Last Year: Never true    Ran Out of Food in the Last Year: Never true  Transportation Needs: No Transportation Needs (01/25/2023)    PRAPARE - Administrator, Civil Service (Medical): No    Lack of Transportation (Non-Medical): No  Physical Activity: Sufficiently Active (06/15/2022)   Exercise Vital Sign    Days of Exercise per Week: 4 days    Minutes of Exercise per Session: 40 min  Stress: Stress Concern Present (06/15/2022)   [employer] of Occupational Health - Occupational Stress Questionnaire    Feeling of Stress : Rather much  Social Connections: Moderately Integrated (06/15/2022)   Social Connection and Isolation Panel [NHANES]    Frequency of Communication with Friends and Family: More than three times a week    Frequency of Social Gatherings with Friends and Family: Three times a week    Attends Religious Services: More than 4 times per year    Active Member of Clubs or Organizations: No    Attends Banker Meetings: Never    Marital Status: Married  Catering manager Violence: Not At Risk (01/25/2023)   Humiliation, Afraid, Rape, and Kick questionnaire    Fear of Current or Ex-Partner: No    Emotionally Abused: No    Physically Abused: No    Sexually Abused: No   Social History   Tobacco Use  Smoking Status Never  Smokeless Tobacco Never  Tobacco Comments   tobacco use -no   Social History   Substance and Sexual Activity  Alcohol Use No    Family History:  Family History  Problem Relation Age of Onset   COPD Mother        alive @  59   Atrial fibrillation Mother    Stroke Father        died @ 7   Breast cancer Maternal Aunt 44   COPD Maternal Grandmother    Ovarian cancer Neg Hx    Diabetes Neg Hx     Past medical history, surgical history, medications, allergies, family history and social history reviewed with patient today and changes made to appropriate areas of the chart.   Review of Systems  Constitutional: Negative.   HENT: Negative.    Eyes: Negative.   Respiratory: Negative.    Cardiovascular: Negative.   Gastrointestinal: Negative.    Genitourinary: Negative.   Musculoskeletal:  Positive for back pain and myalgias. Negative for falls, joint pain and neck pain.  Skin: Negative.    All other ROS negative except what is listed above and in the HPI.      Objective:    BP 102/72   Pulse 77   Ht 5\' 4"  (1.626 m)   Wt 154 lb 3.2 oz (69.9 kg)   SpO2 90%   BMI 26.47 kg/m   Wt Readings from Last 3 Encounters:  05/10/23 154 lb 3.2 oz (69.9 kg)  03/30/23 150 lb (68 kg)  03/18/23 155 lb 2 oz (70.4 kg)    Physical Exam Vitals and nursing note reviewed.  Constitutional:      General: She is not in acute distress.    Appearance: Normal appearance. She is not ill-appearing, toxic-appearing or diaphoretic.  HENT:     Head: Normocephalic and atraumatic.     Right Ear: Tympanic membrane, ear canal and external ear normal. There is no impacted cerumen.     Left Ear: Tympanic membrane, ear canal and external ear normal. There is no impacted cerumen.     Nose: Nose normal. No congestion or rhinorrhea.     Mouth/Throat:     Mouth: Mucous membranes are moist.     Pharynx: Oropharynx is clear. No oropharyngeal exudate or posterior oropharyngeal erythema.  Eyes:     General: No scleral icterus.       Right eye: No discharge.        Left eye: No discharge.     Extraocular Movements: Extraocular movements intact.     Conjunctiva/sclera: Conjunctivae normal.     Pupils: Pupils are equal, round, and reactive to light.  Neck:     Vascular: No carotid bruit.  Cardiovascular:     Rate and Rhythm: Normal rate and regular rhythm.     Pulses: Normal pulses.     Heart sounds: No murmur heard.    No friction rub. No gallop.  Pulmonary:     Effort: Pulmonary effort is normal. No respiratory distress.     Breath sounds: Normal breath sounds. No stridor. No wheezing, rhonchi or rales.  Chest:     Chest wall: No tenderness.  Abdominal:     General: Abdomen is flat. Bowel sounds are normal. There is no distension.     Palpations:  Abdomen is soft. There is no mass.     Tenderness: There is no abdominal tenderness. There is no right CVA tenderness, left CVA tenderness, guarding or rebound.     Hernia: No hernia is present.  Genitourinary:    Comments: Breast and pelvic exams deferred with shared decision making Musculoskeletal:        General: No swelling, tenderness, deformity or signs of injury.     Cervical back: Normal range of motion and neck supple. No rigidity. No  muscular tenderness.     Right lower leg: No edema.     Left lower leg: No edema.  Lymphadenopathy:     Cervical: No cervical adenopathy.  Skin:    General: Skin is warm and dry.     Capillary Refill: Capillary refill takes less than 2 seconds.     Coloration: Skin is not jaundiced or pale.     Findings: No bruising, erythema, lesion or rash.  Neurological:     General: No focal deficit present.     Mental Status: She is alert and oriented to person, place, and time. Mental status is at baseline.     Cranial Nerves: No cranial nerve deficit.     Sensory: No sensory deficit.     Motor: No weakness.     Coordination: Coordination normal.     Gait: Gait normal.     Deep Tendon Reflexes: Reflexes normal.  Psychiatric:        Mood and Affect: Mood normal.        Behavior: Behavior normal.        Thought Content: Thought content normal.        Judgment: Judgment normal.     Results for orders placed or performed in visit on 03/08/23  CUP PACEART REMOTE DEVICE CHECK  Result Value Ref Range   Date Time Interrogation Session [id number]    Pulse Generator Manufacturer MERM    Pulse Gen Model [id number] Adapta    Pulse Gen Serial Number [id number]    Clinic Name [facility]    Implantable Pulse Generator Type Implantable Pulse Generator    Implantable Pulse Generator Implant Date 60630160    Implantable Lead Manufacturer MERM    Implantable Lead Model 4092 CapSure SP Novus    Implantable Lead Serial Number [id number]    Implantable Lead  Implant Date 10932355    Implantable Lead Location [id number]    Implantable Lead Connection Status [id number]    Implantable Lead Manufacturer MERM    Implantable Lead Model 4592 CapSure SP Novus    Implantable Lead Serial Number [id number]    Implantable Lead Implant Date [id number]    Implantable Lead Location [id number]    Implantable Lead Connection Status [id number]    Lead Channel Setting Sensing Sensitivity 2.80 mV   Lead Channel Setting Pacing Amplitude 2.000 V   Lead Channel Setting Pacing Pulse Width 0.40 ms   Lead Channel Setting Pacing Amplitude 2.500 V   Zone Setting Status 755011    Zone Setting Status 755011    Lead Channel Impedance Value 547 ohm   Lead Channel Pacing Threshold Amplitude 0.500 V   Lead Channel Pacing Threshold Pulse Width 0.40 ms   Lead Channel Impedance Value 737 ohm   Lead Channel Pacing Threshold Amplitude 1.125 V   Lead Channel Pacing Threshold Pulse Width 0.40 ms   Battery Status OK    Battery Remaining Longevity 102 mo   Battery Voltage 2.78 V   Battery Impedance 486 ohm   Brady Statistic AP VP Percent 0 %   Brady Statistic AS VP Percent 0 %   Brady Statistic AP VS Percent 11 %   Brady Statistic AS VS Percent 89 %      Assessment & Plan:   Problem List Items Addressed This Visit       Cardiovascular and Mediastinum   Senile purpura (HCC)    Stable. Continue to monitor.         Endocrine   Hypothyroidism    Rechecking  labs today. Await results. Treat as needed.       Relevant Orders   Thyroid Panel With TSH   Other Visit Diagnoses     Routine general medical examination at a health care facility    -  Primary   Vaccines up to date/declined. Screening labs checked today. Pap N/A. Mammo ordered. Colonoscopy up to date. Continue diet and exercise. Call with any concerns.   Relevant Orders   CBC with Differential/Platelet   Comprehensive metabolic panel   Lipid Panel w/o Chol/HDL Ratio   Encounter for screening mammogram for malignant  neoplasm of breast       Mammo ordered today.   Relevant Orders   MM 3D SCREENING MAMMOGRAM BILATERAL BREAST        Follow up plan: Return in about 1 year (around 05/09/2024) for physical.   LABORATORY TESTING:  - Pap smear: not applicable  IMMUNIZATIONS:   - Tdap: Tetanus vaccination status reviewed: last tetanus booster within 10 years. - Influenza: Refused - Pneumovax: Refused - Prevnar: Refused - COVID: Refused - HPV: Not applicable - Shingrix vaccine: Refused  SCREENING: -Mammogram: Ordered today  - Colonoscopy: Up to date  - Bone Density: Up to date    PATIENT COUNSELING:   Advised to take 1 mg of folate supplement per day if capable of pregnancy.   Sexuality: Discussed sexually transmitted diseases, partner selection, use of condoms, avoidance of unintended pregnancy  and contraceptive alternatives.   Advised to avoid cigarette smoking.  I discussed with the patient that most people either abstain from alcohol or drink within safe limits (<=14/week and <=4 drinks/occasion for males, <=7/weeks and <= 3 drinks/occasion for females) and that the risk for alcohol disorders and other health effects rises proportionally with the number of drinks per week and how often a drinker exceeds daily limits.  Discussed cessation/primary prevention of drug use and availability of treatment for abuse.   Diet: Encouraged to adjust caloric intake to maintain  or achieve ideal body weight, to reduce intake of dietary saturated fat and total fat, to limit sodium intake by avoiding high sodium foods and not adding table salt, and to maintain adequate dietary potassium and calcium preferably from fresh fruits, vegetables, and low-fat dairy products.    stressed the importance of regular exercise  Injury prevention: Discussed safety belts, safety helmets, smoke detector, smoking near bedding or upholstery.   Dental health: Discussed importance of regular tooth brushing, flossing, and  dental visits.    NEXT PREVENTATIVE PHYSICAL DUE IN 1 YEAR. Return in about 1 year (around 05/09/2024) for physical.

## 2023-05-10 NOTE — Patient Instructions (Signed)
Please call to schedule your mammogram and/or bone density: [facility]  Address: [address], [place], [place] [address] Phone: [phone]  [facility] [place] [address] [place],  [place]  [address] Phone: [phone]   

## 2023-05-10 NOTE — Assessment & Plan Note (Signed)
Rechecking labs today. Await results. Treat as needed.  °

## 2023-05-10 NOTE — Assessment & Plan Note (Signed)
Stable.       - Continue to monitor

## 2023-05-11 LAB — COMPREHENSIVE METABOLIC PANEL
ALT: 22 [IU]/L (ref 0–32)
AST: 23 [IU]/L (ref 0–40)
Albumin: 4.1 g/dL (ref 3.9–4.9)
Alkaline Phosphatase: 72 [IU]/L (ref 44–121)
BUN/Creatinine Ratio: 40 — ABNORMAL HIGH (ref 12–28)
BUN: 27 mg/dL (ref 8–27)
Bilirubin Total: 0.8 mg/dL (ref 0.0–1.2)
CO2: 24 mmol/L (ref 20–29)
Calcium: 9.2 mg/dL (ref 8.7–10.3)
Chloride: 107 mmol/L — ABNORMAL HIGH (ref 96–106)
Creatinine, Ser: 0.67 mg/dL (ref 0.57–1.00)
Globulin, Total: 1.8 g/dL (ref 1.5–4.5)
Glucose: 89 mg/dL (ref 70–99)
Potassium: 4.2 mmol/L (ref 3.5–5.2)
Sodium: 143 mmol/L (ref 134–144)
Total Protein: 5.9 g/dL — ABNORMAL LOW (ref 6.0–8.5)
eGFR: 95 mL/min/{1.73_m2} (ref >59)

## 2023-05-11 LAB — CBC WITH DIFFERENTIAL/PLATELET
Basophils Absolute: 0 10*3/uL (ref 0.0–0.2)
Basos: 1 %
EOS (ABSOLUTE): 0.1 10*3/uL (ref 0.0–0.4)
Eos: 3 %
Hematocrit: 41.5 % (ref 34.0–46.6)
Hemoglobin: 13.6 g/dL (ref 11.1–15.9)
Immature Grans (Abs): 0 10*3/uL (ref 0.0–0.1)
Immature Granulocytes: 0 %
Lymphocytes Absolute: 0.8 10*3/uL (ref 0.7–3.1)
Lymphs: 19 %
MCH: 30.9 pg (ref 26.6–33.0)
MCHC: 32.8 g/dL (ref 31.5–35.7)
MCV: 94 fL (ref 79–97)
Monocytes Absolute: 0.4 10*3/uL (ref 0.1–0.9)
Monocytes: 9 %
Neutrophils Absolute: 2.8 10*3/uL (ref 1.4–7.0)
Neutrophils: 68 %
Platelets: 145 10*3/uL — ABNORMAL LOW (ref 150–450)
RBC: 4.4 x10E6/uL (ref 3.77–5.28)
RDW: 12.7 % (ref 11.7–15.4)
WBC: 4.1 10*3/uL (ref 3.4–10.8)

## 2023-05-11 LAB — LIPID PANEL W/O CHOL/HDL RATIO
Cholesterol, Total: 193 mg/dL (ref 100–199)
HDL: 77 mg/dL (ref >39)
LDL Chol Calc (NIH): 109 mg/dL — ABNORMAL HIGH (ref 0–99)
Triglycerides: 36 mg/dL (ref 0–149)
VLDL Cholesterol Cal: 7 mg/dL (ref 5–40)

## 2023-05-11 LAB — THYROID PANEL WITH TSH
Free Thyroxine Index: 2 (ref 1.2–4.9)
T3 Uptake Ratio: 27 % (ref 24–39)
T4, Total: 7.5 ug/dL (ref 4.5–12.0)
TSH: 2.86 u[IU]/mL (ref 0.450–4.500)

## 2023-05-13 DIAGNOSIS — M545 Low back pain, unspecified: Secondary | ICD-10-CM | POA: Diagnosis not present

## 2023-05-21 ENCOUNTER — Other Ambulatory Visit: Payer: Self-pay | Admitting: Family Medicine

## 2023-05-23 ENCOUNTER — Other Ambulatory Visit: Payer: Self-pay | Admitting: Family Medicine

## 2023-05-23 NOTE — Telephone Encounter (Signed)
Requested by interface surescripts. Future visit in 11 months.  Requested Prescriptions  Pending Prescriptions Disp Refills   levothyroxine (SYNTHROID) 75 MCG tablet [Pharmacy Med Name: LEVOTHYROXINE SODIUM 75 MCG TAB] 90 tablet 1    Sig: TAKE 1 TABLET BY MOUTHY DAILY     Endocrinology:  Hypothyroid Agents Passed - 05/21/2023  8:49 AM      Passed - TSH in normal range and within 360 days    TSH  Date Value Ref Range Status  05/10/2023 2.860 0.450 - 4.500 uIU/mL Final         Passed - Valid encounter within last 12 months    Recent Outpatient Visits           1 week ago Routine general medical examination at a health care facility   [facility], Megan P, DO   3 months ago Transient neurologic deficit   [facility], Megan P, DO   5 months ago Acute cystitis with hematuria   [facility] Larae Grooms, NP   6 months ago Acute cystitis with hematuria   [facility], Dorie Rank, NP   7 months ago Right lumbosacral radiculopathy   [facility], Ocie Bob, MD       Future Appointments             In 11 months Dorcas Carrow, DO  [facility]

## 2023-05-23 NOTE — Telephone Encounter (Signed)
Medication Refill -  Most Recent Primary Care Visit:  Provider: Dorcas Carrow  Department: [facility]  Visit Type: PHYSICAL  Date: 05/10/2023  Medication: levothyroxine (SYNTHROID) 75 MCG tablet   Has the patient contacted their pharmacy? No  Is this the correct pharmacy for this prescription? Yes If no, delete pharmacy and type the correct one.  This is the patient's preferred pharmacy: [facility] - [place], [place] - [address] Reesa Chew [place] [address] Phone: [phone] Fax: [phone]  Has the prescription been filled recently? Yes  Is the patient out of the medication? Yes  Has the patient been seen for an appointment in the last year OR does the patient have an upcoming appointment? Yes  Can we respond through MyChart? Yes  Agent: Please be advised that Rx refills may take up to 3 business days. We ask that you follow-up with your pharmacy.

## 2023-05-24 NOTE — Telephone Encounter (Signed)
Requested Prescriptions  Refused Prescriptions Disp Refills   levothyroxine (SYNTHROID) 75 MCG tablet 90 tablet 1    Sig: TAKE 1 TABLET BY MOUTHY DAILY     Endocrinology:  Hypothyroid Agents Passed - 05/23/2023 11:14 AM      Passed - TSH in normal range and within 360 days    TSH  Date Value Ref Range Status  05/10/2023 2.860 0.450 - 4.500 uIU/mL Final         Passed - Valid encounter within last 12 months    Recent Outpatient Visits           2 weeks ago Routine general medical examination at a health care facility   [facility], Megan P, DO   3 months ago Transient neurologic deficit   [facility], Megan P, DO   5 months ago Acute cystitis with hematuria   [facility] Larae Grooms, NP   6 months ago Acute cystitis with hematuria   [facility], Dorie Rank, NP   7 months ago Right lumbosacral radiculopathy   [facility], Ocie Bob, MD       Future Appointments             In 11 months Dorcas Carrow, DO [facility]

## 2023-06-03 DIAGNOSIS — M545 Low back pain, unspecified: Secondary | ICD-10-CM | POA: Diagnosis not present

## 2023-06-07 ENCOUNTER — Ambulatory Visit (INDEPENDENT_AMBULATORY_CARE_PROVIDER_SITE_OTHER): Payer: PPO

## 2023-06-07 DIAGNOSIS — I441 Atrioventricular block, second degree: Secondary | ICD-10-CM

## 2023-06-07 LAB — CUP PACEART REMOTE DEVICE CHECK
Battery Impedance: 561 Ohm
Battery Remaining Longevity: 102 mo
Battery Voltage: 2.78 V
Brady Statistic AP VP Percent: 0 %
Brady Statistic AP VS Percent: 12 %
Brady Statistic AS VP Percent: 0 %
Brady Statistic AS VS Percent: 88 %
Date Time Interrogation Session: 20241126054341
Implantable Lead Connection Status: 753985
Implantable Lead Connection Status: 753985
Implantable Lead Implant Date: 20061128
Implantable Lead Implant Date: 20061128
Implantable Lead Location: 753859
Implantable Lead Location: 753860
Implantable Lead Model: 4092
Implantable Lead Model: 4592
Implantable Pulse Generator Implant Date: 20161123
Lead Channel Impedance Value: 635 Ohm
Lead Channel Impedance Value: 746 Ohm
Lead Channel Pacing Threshold Amplitude: 0.625 V
Lead Channel Pacing Threshold Amplitude: 1.375 V
Lead Channel Pacing Threshold Pulse Width: 0.4 ms
Lead Channel Pacing Threshold Pulse Width: 0.4 ms
Lead Channel Setting Pacing Amplitude: 2 V
Lead Channel Setting Pacing Amplitude: 2.75 V
Lead Channel Setting Pacing Pulse Width: 0.4 ms
Lead Channel Setting Sensing Sensitivity: 2.8 mV
Zone Setting Status: 755011
Zone Setting Status: 755011

## 2023-06-21 ENCOUNTER — Ambulatory Visit: Payer: PPO | Admitting: Emergency Medicine

## 2023-06-21 VITALS — Ht 64.0 in | Wt 146.0 lb

## 2023-06-21 DIAGNOSIS — Z0000 Encounter for general adult medical examination without abnormal findings: Secondary | ICD-10-CM | POA: Diagnosis not present

## 2023-06-21 NOTE — Progress Notes (Signed)
Subjective:   Wendy Kelly is a 67 y.o. female who presents for Medicare Annual (Subsequent) preventive examination.  Visit Complete: Virtual I connected with  Wendy Kelly on 06/21/23 by a audio enabled telemedicine application and verified that I am speaking with the correct person using two identifiers.  Patient Location: Home  Provider Location: Office/Clinic  I discussed the limitations of evaluation and management by telemedicine. The patient expressed understanding and agreed to proceed.  Vital Signs: Because this visit was a virtual/telehealth visit, some criteria may be missing or patient reported. Any vitals not documented were not able to be obtained and vitals that have been documented are patient reported.   Cardiac Risk Factors include: advanced age (>30men, >58 women);Other (see comment), Risk factor comments: 2nd degree AV block, A. Fib, Aortic atherosclerosis     Objective:    Today's Vitals   06/21/23 0946  Weight: 146 lb (66.2 kg)  Height: 5\' 4"  (1.626 m)   Body mass index is 25.06 kg/m.     06/21/2023   10:03 AM 01/25/2023    4:12 PM 06/15/2022   11:33 AM 05/26/2022   10:35 AM 05/22/2021   11:20 AM 05/20/2021   12:18 PM 05/20/2020    9:47 AM  Advanced Directives  Does Patient Have a Medical Advance Directive? Yes Yes No Yes Yes Yes No  Type of Estate agent of [place];Living will Healthcare Power of [place];Living will Healthcare Power of [employer] of [place];Living will Healthcare Power of [place];Living will Living will;Healthcare Power of Attorney   Does patient want to make changes to medical advance directive? No - Patient declined No - Patient declined   No - Patient declined    Copy of Healthcare Power of Attorney in Chart? No - copy requested No - copy requested Yes - validated most recent copy scanned in chart (See row information)   No - copy requested   Would patient like information on creating a  medical advance directive?     No - Patient declined      Current Medications (verified) Outpatient Encounter Medications as of 06/21/2023  Medication Sig   azelastine (ASTELIN) 0.1 % nasal spray SMARTSIG:1-2 Spray(s) Both Nares Every 12 Hours PRN   b complex vitamins tablet Take 2 tablets by mouth daily.   Calcium Carb-Cholecalciferol (CALCIUM 500 + D3 PO) Take 2 Doses by mouth daily.    CRANBERRY SOFT PO Take 2 each by mouth daily.    Digestive Enzymes (DIGESTIVE ENZYME PO) Take 1-2 capsules by mouth daily.   levothyroxine (SYNTHROID) 75 MCG tablet TAKE 1 TABLET BY MOUTHY DAILY   NON FORMULARY Essential Oils   PRESCRIPTION MEDICATION Place 1 tablet vaginally 3 (three) times a week. E3 1mg  and Testosterone 1mg  compound   sodium chloride (MURO 128) 5 % ophthalmic solution Place 1 drop into both eyes daily.   Facility-Administered Encounter Medications as of 06/21/2023  Medication   0.9 %  sodium chloride infusion    Allergies (verified) Cefdinir, Cephalosporins, Dairy aid [tilactase], Flagyl [metronidazole], Lactose intolerance (gi), Penicillins, Phenylephrine-guaifenesin, Sulfa antibiotics, Sulfonamide derivatives, Tape, Dexilant [dexlansoprazole], Entex lq [phenylephrine-guaifenesin], Other, Oxycodone, Succinylcholine, Vancomycin, Clarithromycin, Haemophilus influenzae vaccines, Tetracycline, Codeine, Guaifenesin & derivatives, Lactobacillus, Naproxen sodium, Pseudoephedrine, Tetracyclines & related, and Wheat   History: Past Medical History:  Diagnosis Date   AA (aortic aneurysm) (HCC)    a. 04/2011 Echo: Ao Root: 4.1cm, Asc Ao 4.7cm.   Anxiety    AV block, 2nd degree    a. 05/2005 -  s/p MDT Adapta ADDR01 Dual Chamber PPM   Bicuspid aortic valve    Cancer (HCC)    Throat   Chest pain    a. 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   COVID-19 virus infection 09/11/2019   Deep vein thrombosis (DVT) of left lower extremity (HCC) 10/03/2017   Dehydration 08/11/2017   Depression    Expressive  aphasia    a. ongoing since 04/2011 - seen by neurology - ? TIA vs. Migraine   Facial numbness    a. ongoing since 04/2011 - seen by neurology - ? TIA vs. 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 04/2011 Echo: EF 55-65%, No RWMA, Gr 1 DD.   Thyroid disease    Past Surgical History:  Procedure Laterality Date   ABDOMINAL WALL MESH  REMOVAL     bladder tack     cataract Bilateral 2004   CHOLECYSTECTOMY  2000   COLONOSCOPY WITH PROPOFOL N/A 06/26/2019   Procedure: COLONOSCOPY WITH PROPOFOL;  Surgeon: Midge Minium, MD;  Location: [facility] ENDOSCOPY;  Service: Endoscopy;  Laterality: N/A;   DILATION AND CURETTAGE OF UTERUS     DIRECT LARYNGOSCOPY Right 06/16/2017   Procedure: MICRO DIRECT LARYNGOSCOPY WITH BIOPSY OF RIGHT BASE OF TONGUE;  Surgeon: Bud Face, MD;  Location: [facility] ORS;  Service: ENT;  Laterality: Right;   EP IMPLANTABLE DEVICE N/A 06/04/2015   Procedure: PPM Generator Changeout;  Surgeon: Duke Salvia, MD;  Location: [facility] INVASIVE CV LAB;  Service: Cardiovascular;  Laterality: N/A;   ESOPHAGOGASTRODUODENOSCOPY (EGD) WITH PROPOFOL N/A 12/18/2015   Procedure: ESOPHAGOGASTRODUODENOSCOPY (EGD) WITH gastric biopsy and dilation;  Surgeon: Midge Minium, MD;  Location: [facility];  Service: Endoscopy;  Laterality: N/A;   EYE SURGERY     INSERT / REPLACE / REMOVE PACEMAKER     IR FLUORO GUIDE PORT INSERTION RIGHT  06/22/2017   IR REMOVAL TUN ACCESS W/ PORT W/O FL MOD SED  04/14/2018   MOHS SURGERY  02/2018   PACEMAKER INSERTION  2006   Medtronic Adapta ADDR01   TONSILLECTOMY     TONSILLECTOMY     VAGINAL HYSTERECTOMY  2013   mad/cope   Family  History  Problem Relation Age of Onset   COPD Mother        alive @ 45   Atrial fibrillation Mother    Stroke Father        died @ 47   Breast cancer Maternal Aunt 76   COPD Maternal Grandmother    Ovarian cancer Neg Hx    Diabetes Neg Hx    Social History   Socioeconomic History   Marital status: Married    Spouse name: Jomarie Longs   Number of children: 2   Years of education: Not on file   Highest education level: Not on file  Occupational History   Occupation: massage therapist  Tobacco Use   Smoking status: Never   Smokeless tobacco: Never   Tobacco comments:    tobacco use -no  Vaping Use   Vaping status: Never Used  Substance and Sexual Activity   Alcohol use: No  Drug use: No   Sexual activity: Yes    Birth control/protection: Surgical  Other Topics Concern   Not on file  Social History Narrative   Lives in [place] with husband.  Works out regularly.  Works as a Teacher, adult education - owns own business.    Social Determinants of Health   Financial Resource Strain: Low Risk  (06/21/2023)   Overall Financial Resource Strain (CARDIA)    Difficulty of Paying Living Expenses: Not hard at all  Food Insecurity: No Food Insecurity (06/21/2023)   Hunger Vital Sign    Worried About Running Out of Food in the Last Year: Never true    Ran Out of Food in the Last Year: Never true  Transportation Needs: No Transportation Needs (06/21/2023)   PRAPARE - Administrator, Civil Service (Medical): No    Lack of Transportation (Non-Medical): No  Physical Activity: Insufficiently Active (06/21/2023)   Exercise Vital Sign    Days of Exercise per Week: 3 days    Minutes of Exercise per Session: 30 min  Stress: Stress Concern Present (06/21/2023)   [employer] of Occupational Health - Occupational Stress Questionnaire    Feeling of Stress : Rather much  Social Connections: Moderately Integrated (06/21/2023)   Social Connection and Isolation Panel [NHANES]     Frequency of Communication with Friends and Family: More than three times a week    Frequency of Social Gatherings with Friends and Family: More than three times a week    Attends Religious Services: More than 4 times per year    Active Member of [employer] or Organizations: No    Attends Engineer, structural: Never    Marital Status: Married    Tobacco Counseling Counseling given: Not Answered Tobacco comments: tobacco use -no   Clinical Intake:  Pre-visit preparation completed: Yes  Pain : No/denies pain     BMI - recorded: 25.06 Nutritional Status: BMI 25 -29 Overweight Nutritional Risks: None Diabetes: No  How often do you need to have someone help you when you read instructions, pamphlets, or other written materials from your doctor or pharmacy?: 1 - Never  Interpreter Needed?: No  Information entered by :: Tora Kindred, CMA   Activities of Daily Living    06/21/2023    9:48 AM 01/25/2023    4:12 PM  In your present state of health, do you have any difficulty performing the following activities:  Hearing? 0 0  Vision? 0 0  Difficulty concentrating or making decisions? 0 0  Walking or climbing stairs? 0 0  Dressing or bathing? 0 0  Doing errands, shopping? 0 0  Preparing Food and eating ? N   Using the Toilet? N   In the past six months, have you accidently leaked urine? N   Do you have problems with loss of bowel control? N   Managing your Medications? N   Managing your Finances? N   Housekeeping or managing your Housekeeping? N     Patient Care Team: Dorcas Carrow, DO as PCP - General (Family Medicine) Antonieta Iba, MD as Consulting Physician (Cardiology) Creig Hines, MD as Consulting Physician (Oncology) Bud Face, MD as Referring Physician (Otolaryngology) Carmina Miller, MD as Referring Physician (Radiation Oncology) Bridgett Larsson, LCSW as Social Worker (Licensed Clinical Social Worker)  Indicate any recent Medical  Services you may have received from other than [facility] providers in the past year (date may be approximate).     Assessment:  This is a routine wellness examination for Dnaja.  Hearing/Vision screen Hearing Screening - Comments:: Some hearing loss, ENT appt in Jan 2025 Vision Screening - Comments:: Gets eye exams   Goals Addressed               This Visit's Progress     Exercise 4-5x per week (30 min per time) (pt-stated)        Depression Screen    06/21/2023    9:58 AM 05/10/2023    8:16 AM 02/14/2023   10:09 AM 08/02/2022    4:25 PM 06/15/2022   11:39 AM 05/04/2022    8:09 AM 09/28/2021   11:02 AM  PHQ 2/9 Scores  PHQ - 2 Score 0 0 0 0 0 0 0  PHQ- 9 Score 0  0 2 2 4  0    Fall Risk    06/21/2023   10:04 AM 05/10/2023    8:16 AM 02/14/2023   10:09 AM 08/02/2022    4:25 PM 06/15/2022   11:33 AM  Fall Risk   Falls in the past year? 0 0 0 0 0  Number falls in past yr: 0 0 0 0 0  Injury with Fall? 0 0 0 0 0  Risk for fall due to : No Fall Risks No Fall Risks No Fall Risks No Fall Risks   Follow up Falls prevention discussed Falls evaluation completed Falls evaluation completed Falls evaluation completed Falls evaluation completed;Education provided;Falls prevention discussed    MEDICARE RISK AT HOME: Medicare Risk at Home Any stairs in or around the home?: Yes If so, are there any without handrails?: No Home free of loose throw rugs in walkways, pet beds, electrical cords, etc?: Yes Adequate lighting in your home to reduce risk of falls?: Yes Life alert?: No Use of a cane, walker or w/c?: No Grab bars in the bathroom?: Yes Shower chair or bench in shower?: Yes Elevated toilet seat or a handicapped toilet?: Yes  TIMED UP AND GO:  Was the test performed?  No    Cognitive Function:        06/21/2023   10:05 AM 06/15/2022   11:36 AM 04/29/2020    9:44 AM  6CIT Screen  What Year? 0 points 0 points 0 points  What month? 0 points 0 points 0 points  What time? 0  points 0 points 0 points  Count back from 20 0 points 0 points 0 points  Months in reverse 0 points 0 points 0 points  Repeat phrase 0 points 0 points 2 points  Total Score 0 points 0 points 2 points    Immunizations Immunization History  Administered Date(s) Administered   Td 05/18/2006, 04/13/2016    TDAP status: Up to date  Flu Vaccine status: Declined, Education has been provided regarding the importance of this vaccine but patient still declined. Advised may receive this vaccine at local pharmacy or Health Dept. Aware to provide a copy of the vaccination record if obtained from local pharmacy or Health Dept. Verbalized acceptance and understanding. Patient allergic  Pneumococcal vaccine status: Declined,  Education has been provided regarding the importance of this vaccine but patient still declined. Advised may receive this vaccine at local pharmacy or Health Dept. Aware to provide a copy of the vaccination record if obtained from local pharmacy or Health Dept. Verbalized acceptance and understanding.   Covid-19 vaccine status: Declined, Education has been provided regarding the importance of this vaccine but patient still declined. Advised may receive this vaccine  at local pharmacy or Health Dept.or vaccine clinic. Aware to provide a copy of the vaccination record if obtained from local pharmacy or Health Dept. Verbalized acceptance and understanding.  Qualifies for Shingles Vaccine? Yes   Zostavax completed No   Shingrix Completed?: No.    Education has been provided regarding the importance of this vaccine. Patient has been advised to call insurance company to determine out of pocket expense if they have not yet received this vaccine. Advised may also receive vaccine at local pharmacy or Health Dept. Verbalized acceptance and understanding. Patient declined  Screening Tests Health Maintenance  Topic Date Due   Zoster Vaccines- Shingrix (1 of 2) 08/10/2023 (Originally 01/17/1975)    INFLUENZA VACCINE  10/10/2023 (Originally 02/10/2023)   Pneumonia Vaccine 55+ Years old (1 of 2 - PCV) 05/09/2024 (Originally 01/17/1962)   Medicare Annual Wellness (AWV)  06/20/2024   Colonoscopy  06/25/2024   MAMMOGRAM  06/29/2024   DTaP/Tdap/Td (3 - Tdap) 04/13/2026   DEXA SCAN  06/03/2026   Hepatitis C Screening  Completed   HPV VACCINES  Aged Out   COVID-19 Vaccine  Discontinued    Health Maintenance  There are no preventive care reminders to display for this patient.   Colorectal cancer screening: Type of screening: Colonoscopy. Completed 06/26/19. Repeat every 5 years  Mammogram status: Ordered 05/10/23. Pt provided with contact info and advised to call to schedule appt.   Bone Density status: Completed 06/03/21. Results reflect: Bone density results: OSTEOPENIA. Repeat every 5 years.  Lung Cancer Screening: (Low Dose CT Chest recommended if Age 64-80 years, 20 pack-year currently smoking OR have quit w/in 15years.) does not qualify.   Lung Cancer Screening Referral: n/a  Additional Screening:  Hepatitis C Screening: does not qualify; Completed 04/20/18  Vision Screening: Recommended annual ophthalmology exams for early detection of glaucoma and other disorders of the eye.  Dental Screening: Recommended annual dental exams for proper oral hygiene   Community Resource Referral / Chronic Care Management: CRR required this visit?  No   CCM required this visit?  No     Plan:     I have personally reviewed and noted the following in the patient's chart:   Medical and social history Use of alcohol, tobacco or illicit drugs  Current medications and supplements including opioid prescriptions. Patient is not currently taking opioid prescriptions. Functional ability and status Nutritional status Physical activity Advanced directives List of other physicians Hospitalizations, surgeries, and ER visits in previous 12 months Vitals Screenings to include cognitive,  depression, and falls Referrals and appointments  In addition, I have reviewed and discussed with patient certain preventive protocols, quality metrics, and best practice recommendations. A written personalized care plan for preventive services as well as general preventive health recommendations were provided to patient.     Tora Kindred, CMA   06/21/2023   After Visit Summary: (MyChart) Due to this being a telephonic visit, the after visit summary with patients personalized plan was offered to patient via MyChart   Nurse Notes:  Declined pneumonia, Covid, and Shingles vaccine. Allergic to flu vaccine Reminded to schedule MMG

## 2023-06-21 NOTE — Patient Instructions (Signed)
Wendy Kelly , Thank you for taking time to come for your Medicare Wellness Visit. I appreciate your ongoing commitment to your health goals. Please review the following plan we discussed and let me know if I can assist you in the future.   Referrals/Orders/Follow-Ups/Clinician Recommendations: Call [facility] @ [phone] to schedule your mammogram at your earliest convenience.  This is a list of the screening recommended for you and due dates:  Health Maintenance  Topic Date Due   Zoster (Shingles) Vaccine (1 of 2) 08/10/2023*   Flu Shot  10/10/2023*   Pneumonia Vaccine (1 of 2 - PCV) 05/09/2024*   Medicare Annual Wellness Visit  06/20/2024   Colon Cancer Screening  06/25/2024   Mammogram  06/29/2024   DTaP/Tdap/Td vaccine (3 - Tdap) 04/13/2026   DEXA scan (bone density measurement)  06/03/2026   Hepatitis C Screening  Completed   HPV Vaccine  Aged Out   COVID-19 Vaccine  Discontinued  *Topic was postponed. The date shown is not the original due date.    Advanced directives: (Copy Requested) Please bring a copy of your health care power of attorney and living will to the office to be added to your chart at your convenience.  Next Medicare Annual Wellness Visit scheduled for next year: Yes, 06/26/24 @ 10:00am

## 2023-07-01 DIAGNOSIS — M545 Low back pain, unspecified: Secondary | ICD-10-CM | POA: Diagnosis not present

## 2023-07-11 NOTE — Progress Notes (Signed)
Remote pacemaker transmission.   

## 2023-07-17 DIAGNOSIS — Z03818 Encounter for observation for suspected exposure to other biological agents ruled out: Secondary | ICD-10-CM | POA: Diagnosis not present

## 2023-07-17 DIAGNOSIS — N390 Urinary tract infection, site not specified: Secondary | ICD-10-CM | POA: Diagnosis not present

## 2023-07-18 DIAGNOSIS — H93291 Other abnormal auditory perceptions, right ear: Secondary | ICD-10-CM | POA: Diagnosis not present

## 2023-07-18 DIAGNOSIS — H6121 Impacted cerumen, right ear: Secondary | ICD-10-CM | POA: Diagnosis not present

## 2023-07-18 DIAGNOSIS — Z85810 Personal history of malignant neoplasm of tongue: Secondary | ICD-10-CM | POA: Diagnosis not present

## 2023-07-18 DIAGNOSIS — J301 Allergic rhinitis due to pollen: Secondary | ICD-10-CM | POA: Diagnosis not present

## 2023-07-18 DIAGNOSIS — R519 Headache, unspecified: Secondary | ICD-10-CM | POA: Diagnosis not present

## 2023-07-21 ENCOUNTER — Ambulatory Visit: Payer: Self-pay | Admitting: *Deleted

## 2023-07-21 MED ORDER — FLUCONAZOLE 150 MG PO TABS: 150.0000 mg | ORAL_TABLET | Freq: Once | ORAL | 0 refills | Status: DC

## 2023-07-21 NOTE — Addendum Note (Signed)
 Addended by: Dorcas Carrow on: 07/21/2023 01:54 PM   Modules accepted: Orders

## 2023-07-21 NOTE — Telephone Encounter (Signed)
 Rx sent to her pharmacy. If not better with that, will need to be seen

## 2023-07-21 NOTE — Telephone Encounter (Signed)
 Message from [place] sent at 07/21/2023 12:04 PM EST  Summary: Rx requested  - Vaginal burning   Pt seen UC on 07/16/2022 and dx with UTI. Pt was given antibiotic for UTI. Pt states she now has vaginal burning, started this morning and believes it may be a yeast infection.  Pt requesting rx to help with symptoms          Call History  Contact Date/Time Type Contact Phone/Fax By  07/21/2023 12:01 PM EST Phone (Incoming) Barbaro, Aviannah Castoro (Self) [phone] MIKE) Wilfred Loges, Tobias HERO   Reason for Disposition  All other vaginal symptoms  (Exception: Feels like prior yeast infection, minor abrasion, mild rash < 24 hour duration, mild itching.)  Answer Assessment - Initial Assessment Questions 1. SYMPTOM: What's the main symptom you're concerned about? (e.g., pain, itching, dryness)     I'm not itching but I'm burning in my vaginal area.   My sides are achy.    I was seen at urgent care 07/17/2023     Macrobid  she put on.    This morning my vaginal symptoms started.    I had a UTI in May and put me on Macrobid  and it helped.    My urine culture hasn't come back yet from the urgent care.   I'm to be on it for 10 days. 2. LOCATION: Where is the  burning located? (e.g., inside/outside, left/right)     Vaginal area 3. ONSET: When did the burning  start?     This morning 4. PAIN: Is there any pain? If Yes, ask: How bad is it? (Scale: 1-10; mild, moderate, severe)   -  MILD (1-3): Doesn't interfere with normal activities.    -  MODERATE (4-7): Interferes with normal activities (e.g., work or school) or awakens from sleep.     -  SEVERE (8-10): Excruciating pain, unable to do any normal activities.     I'm having vaginal burning. 5. ITCHING: Is there any itching? If Yes, ask: How bad is it? (Scale: 1-10; mild, moderate, severe)     No 6. CAUSE: What do you think is causing the discharge? Have you had the same problem before? What happened then?     No discharge    No  fever.   7. OTHER SYMPTOMS: Do you have any other symptoms? (e.g., fever, itching, vaginal bleeding, pain with urination, injury to genital area, vaginal foreign body)     UTI symptoms were getting better.    I'm having aching in my lower abd and sides.   I've been on the antibiotics for 3 days now and this morning the burning started.    It's been a long time since I've yeast infection but Diflucan  gets rid of it.  8. PREGNANCY: Is there any chance you are pregnant? When was your last menstrual period?     N/A due to age  Protocols used: Vaginal Symptoms-A-AH

## 2023-07-21 NOTE — Telephone Encounter (Signed)
 Called and notified of patient of Dr Henriette Combs message. Patient verbalized understanding.

## 2023-07-21 NOTE — Telephone Encounter (Signed)
 Routing to provider. Reviewed chart, patient went to [facility] on 07/17/23.

## 2023-07-21 NOTE — Telephone Encounter (Signed)
  Chief Complaint: Requesting Diflucan  be called in for a vaginal infection she is getting from being on Macrobid  for a UTI.   She's on day 3 of Macrobid  and this morning she started having vaginal burning.  She was seen at urgent care on 07/17/2023 and prescribed the Macrobid .   Waiting on urine culture from urgent care.    Frequency: Vaginal burning that started this morning.   Pertinent Negatives: Patient denies Fever or discharge, no itching just the burning. Disposition: [] ED /[] Urgent Care (no appt availability in office) / [] Appointment(In office/virtual)/ []  [place] [facility]/ [] Home Care/ [] Refused Recommended Disposition /[] [facility]/ [x]  Follow-up with PCP Additional Notes: She has her grand children with her today and can't come in.    I know they are calling for this snow event too for tomorrow [place]   Is it possible Dr. Vicci would call in the Diflucan  for me?  I don't want to get stuck over the weekend with this vaginal infection getting worse.   She's prescribed that for me before when I've been on an antibiotic and this happened.   If she is willing please send to [facility].  Message sent to Dr. Vicci.   Pt. [place] it was ok to respond via MyChart.

## 2023-07-22 ENCOUNTER — Ambulatory Visit
Admission: RE | Admit: 2023-07-22 | Discharge: 2023-07-22 | Disposition: A | Discharge status: Home / Self Care | Payer: PPO | Source: Ambulatory Visit | Attending: Family Medicine | Admitting: Family Medicine

## 2023-07-22 ENCOUNTER — Other Ambulatory Visit: Payer: Self-pay | Admitting: Family Medicine

## 2023-07-22 ENCOUNTER — Telehealth: Payer: Self-pay | Admitting: Family Medicine

## 2023-07-22 DIAGNOSIS — Z1231 Encounter for screening mammogram for malignant neoplasm of breast: Secondary | ICD-10-CM | POA: Insufficient documentation

## 2023-07-22 MED ORDER — FLUCONAZOLE 150 MG PO TABS: 150.0000 mg | ORAL_TABLET | Freq: Once | ORAL | 0 refills | Status: AC

## 2023-07-22 NOTE — Telephone Encounter (Signed)
 Copied from CRM [id number]. Topic: General - Inquiry >> Jul 22, 2023 12:31 PM Nathanel DEL wrote: Reason for CRM: pt was told diflucan  was sent to her pharmacy, but it is not there. Pt was hoping to get before the weekend.  [facility] - [place], [place] - [address]

## 2023-07-25 ENCOUNTER — Encounter: Payer: Self-pay | Admitting: Family Medicine

## 2023-07-28 ENCOUNTER — Other Ambulatory Visit: Payer: Self-pay | Admitting: Family Medicine

## 2023-07-28 ENCOUNTER — Telehealth: Payer: Self-pay | Admitting: Family Medicine

## 2023-07-28 NOTE — Telephone Encounter (Signed)
Unfortunately this never came to me and the nurses cannot refill a compounded medicine. I'm sending in a refill- in future to avoid the trouble with this, when she's coming due, just have her send me a mychart message and I'll send it in for her

## 2023-07-28 NOTE — Telephone Encounter (Signed)
Prescription faxed to [facility] in [place] as requested.

## 2023-07-28 NOTE — Telephone Encounter (Signed)
Patient called said she went to the pharmacy to get the compound that she normaly use for vaginal dryness and the refill was denied by the physician. Unable to locate the name on her med list as patient could not think of what its called. Please f/u with patient

## 2023-08-15 DIAGNOSIS — M545 Low back pain, unspecified: Secondary | ICD-10-CM | POA: Diagnosis not present

## 2023-08-19 ENCOUNTER — Other Ambulatory Visit: Payer: Self-pay | Admitting: Family Medicine

## 2023-08-19 NOTE — Telephone Encounter (Signed)
 Requested Prescriptions  Refused Prescriptions Disp Refills   levothyroxine  (SYNTHROID ) 75 MCG tablet [Pharmacy Med Name: LEVOTHYROXINE  SODIUM 75 MCG TAB] 90 tablet 1    Sig: TAKE 1 TABLET BY MOUTHY DAILY     Endocrinology:  Hypothyroid Agents Passed - 08/19/2023  4:25 PM      Passed - TSH in normal range and within 360 days    TSH  Date Value Ref Range Status  05/10/2023 2.860 0.450 - 4.500 uIU/mL Final         Passed - Valid encounter within last 12 months    Recent Outpatient Visits           3 months ago Routine general medical examination at a health care facility   [facility] Megan P, DO   6 months ago Transient neurologic deficit   [place] [facility], Megan P, DO   8 months ago Acute cystitis with hematuria   [place] [facility] Melvin Pao, NP   9 months ago Acute cystitis with hematuria   [place] [facility], Melanie DASEN, NP   10 months ago Right lumbosacral radiculopathy   [facility] [facility], Selinda PARAS, MD       Future Appointments             In 8 months Vicci Duwaine SQUIBB, DO [facility]

## 2023-08-30 DIAGNOSIS — M545 Low back pain, unspecified: Secondary | ICD-10-CM | POA: Diagnosis not present

## 2023-09-06 ENCOUNTER — Ambulatory Visit (INDEPENDENT_AMBULATORY_CARE_PROVIDER_SITE_OTHER): Payer: PPO

## 2023-09-06 DIAGNOSIS — I441 Atrioventricular block, second degree: Secondary | ICD-10-CM

## 2023-09-07 LAB — CUP PACEART REMOTE DEVICE CHECK
Battery Impedance: 612 Ohm
Battery Remaining Longevity: 92 mo
Battery Voltage: 2.79 V
Brady Statistic AP VP Percent: 0 %
Brady Statistic AP VS Percent: 11 %
Brady Statistic AS VP Percent: 0 %
Brady Statistic AS VS Percent: 89 %
Date Time Interrogation Session: 20250225053751
Implantable Lead Connection Status: 753985
Implantable Lead Connection Status: 753985
Implantable Lead Implant Date: 20061128
Implantable Lead Implant Date: 20061128
Implantable Lead Location: 753859
Implantable Lead Location: 753860
Implantable Lead Model: 4092
Implantable Lead Model: 4592
Implantable Pulse Generator Implant Date: 20161123
Lead Channel Impedance Value: 556 Ohm
Lead Channel Impedance Value: 755 Ohm
Lead Channel Pacing Threshold Amplitude: 0.625 V
Lead Channel Pacing Threshold Amplitude: 1.125 V
Lead Channel Pacing Threshold Pulse Width: 0.4 ms
Lead Channel Pacing Threshold Pulse Width: 0.4 ms
Lead Channel Setting Pacing Amplitude: 2 V
Lead Channel Setting Pacing Amplitude: 2.5 V
Lead Channel Setting Pacing Pulse Width: 0.4 ms
Lead Channel Setting Sensing Sensitivity: 2.8 mV
Zone Setting Status: 755011
Zone Setting Status: 755011

## 2023-09-26 DIAGNOSIS — L570 Actinic keratosis: Secondary | ICD-10-CM | POA: Diagnosis not present

## 2023-09-26 DIAGNOSIS — D2271 Melanocytic nevi of right lower limb, including hip: Secondary | ICD-10-CM | POA: Diagnosis not present

## 2023-09-26 DIAGNOSIS — D2272 Melanocytic nevi of left lower limb, including hip: Secondary | ICD-10-CM | POA: Diagnosis not present

## 2023-09-26 DIAGNOSIS — D2261 Melanocytic nevi of right upper limb, including shoulder: Secondary | ICD-10-CM | POA: Diagnosis not present

## 2023-09-26 DIAGNOSIS — Z08 Encounter for follow-up examination after completed treatment for malignant neoplasm: Secondary | ICD-10-CM | POA: Diagnosis not present

## 2023-09-26 DIAGNOSIS — L821 Other seborrheic keratosis: Secondary | ICD-10-CM | POA: Diagnosis not present

## 2023-09-26 DIAGNOSIS — Z85828 Personal history of other malignant neoplasm of skin: Secondary | ICD-10-CM | POA: Diagnosis not present

## 2023-09-26 DIAGNOSIS — D2262 Melanocytic nevi of left upper limb, including shoulder: Secondary | ICD-10-CM | POA: Diagnosis not present

## 2023-09-26 DIAGNOSIS — D225 Melanocytic nevi of trunk: Secondary | ICD-10-CM | POA: Diagnosis not present

## 2023-09-27 ENCOUNTER — Encounter: Payer: Self-pay | Admitting: Internal Medicine

## 2023-10-17 NOTE — Addendum Note (Signed)
 Addended by: Elease Etienne A on: 10/17/2023 10:31 AM   Modules accepted: Orders

## 2023-10-17 NOTE — Progress Notes (Signed)
 Remote pacemaker transmission.

## 2023-12-06 ENCOUNTER — Ambulatory Visit: Payer: PPO

## 2023-12-06 DIAGNOSIS — I441 Atrioventricular block, second degree: Secondary | ICD-10-CM

## 2023-12-07 LAB — CUP PACEART REMOTE DEVICE CHECK
Battery Impedance: 638 Ohm
Battery Remaining Longevity: 90 mo
Battery Voltage: 2.79 V
Brady Statistic AP VP Percent: 0 %
Brady Statistic AP VS Percent: 10 %
Brady Statistic AS VP Percent: 0 %
Brady Statistic AS VS Percent: 89 %
Date Time Interrogation Session: 20250528121648
Implantable Lead Connection Status: 753985
Implantable Lead Connection Status: 753985
Implantable Lead Implant Date: 20061128
Implantable Lead Implant Date: 20061128
Implantable Lead Location: 753859
Implantable Lead Location: 753860
Implantable Lead Model: 4092
Implantable Lead Model: 4592
Implantable Pulse Generator Implant Date: 20161123
Lead Channel Impedance Value: 531 Ohm
Lead Channel Impedance Value: 753 Ohm
Lead Channel Pacing Threshold Amplitude: 0.5 V
Lead Channel Pacing Threshold Amplitude: 1 V
Lead Channel Pacing Threshold Pulse Width: 0.4 ms
Lead Channel Pacing Threshold Pulse Width: 0.4 ms
Lead Channel Setting Pacing Amplitude: 2 V
Lead Channel Setting Pacing Amplitude: 2.5 V
Lead Channel Setting Pacing Pulse Width: 0.4 ms
Lead Channel Setting Sensing Sensitivity: 2.8 mV
Zone Setting Status: 755011
Zone Setting Status: 755011

## 2023-12-11 ENCOUNTER — Ambulatory Visit: Payer: Self-pay | Admitting: Cardiology

## 2023-12-27 ENCOUNTER — Ambulatory Visit: Payer: Self-pay

## 2023-12-27 NOTE — Telephone Encounter (Signed)
 FYI Only or Action Required?: FYI only for provider  Patient was last seen in primary care on 05/10/2023 by Terre Ferri P, DO. Called Nurse Triage reporting Back Pain. Symptoms began yesterday. Interventions attempted: Nothing. Symptoms are: gradually improving.  Triage Disposition: See HCP Within 4 Hours (Or PCP Triage)  Patient/caregiver understands and will follow disposition?:   Copied from CRM [id number]. Topic: Clinical - Red Word Triage >> Dec 27, 2023  3:07 PM Tiffini S wrote: [facility] that prompted transfer to Nurse Triage: Patient start feeling pain in her lower back and this morning on the side in the lower area- swollen in the abdomin. Having pain. Thinks it is a possible UTI Reason for Disposition  [1] MILD SWELLING of abdomen (e.g., looks distended or swollen) AND [2] new-onset or getting worse AND [3] fever  Answer Assessment - Initial Assessment Questions 1. LOCATION: Where does it hurt?      Abdomen lower  2. RADIATION: Does the pain shoot anywhere else? (e.g., chest, back)     denies 3. ONSET: When did the pain begin? (e.g., minutes, hours or days ago)      This morning 4. SUDDEN: Gradual or sudden onset?     Woke with it this morning 5. PATTERN Does the pain come and go, or is it constant?    - If it comes and goes: How long does it last? Do you have pain now?     (Note: Comes and goes means the pain is intermittent. It goes away completely between bouts.)    - If constant: Is it getting better, staying the same, or getting worse?      (Note: Constant means the pain never goes away completely; most serious pain is constant and gets worse.)      Intermittent, right now no pain 6. SEVERITY: How bad is the pain?  (e.g., Scale 1-10; mild, moderate, or severe)    - MILD (1-3): Doesn't interfere with normal activities, abdomen soft and not tender to touch.     - MODERATE (4-7): Interferes with normal activities or awakens from sleep, abdomen tender to  touch.     - SEVERE (8-10): Excruciating pain, doubled over, unable to do any normal activities.       Was severe this morning 7. RECURRENT SYMPTOM: Have you ever had this type of stomach pain before? If Yes, ask: When was the last time? and What happened that time?      No 8. CAUSE: What do you think is causing the stomach pain?     Unsure 9. RELIEVING/AGGRAVATING FACTORS: What makes it better or worse? (e.g., antacids, bending or twisting motion, bowel movement)     Nothing 10. OTHER SYMPTOMS: Do you have any other symptoms? (e.g., back pain, diarrhea, fever, urination pain, vomiting)       Flank, nausea, swelling  Answer Assessment - Initial Assessment Questions 1. SYMPTOM: What's the main symptom you're concerned about? (e.g., abdomen bloating, swelling)     Swelling 2. ONSET: When did swelling  start?     This morning 3. SEVERITY: How bad is the bloating or swelling?    - BLOATING: Feels gassy or bloated. No visible swelling.     - MILD SWELLING: Feels gassy or bloated. Abdomen looks mildly distended or swollen.    - MODERATE - SEVERE SWELLING: Abdomen looks very distended or swollen.      mild 4. ABDOMEN PAIN:  Is there any abdomen pain? If Yes, ask: How bad is the pain?  (  e.g., Scale 1-10; mild, moderate, or severe)   - NONE (0): No pain.   - MILD (1-3): Doesn't interfere with normal activities, abdomen soft and not tender to touch.    - MODERATE (4-7): Interferes with normal activities or awakens from sleep, abdomen tender to touch.    - SEVERE (8-10): Excruciating pain, doubled over, unable to do any normal activities.       Was severe this morning, now mild across lower abdomen 5. RELIEVING AND AGGRAVATING FACTORS: What makes it better or worse? (e.g., certain foods, lactose, medicines)     Nothing  6. CAUSE: What do you think is causing the bloating?      Unsure, maybe UTI 7.. OTHER SYMPTOMS: Do you have any other symptoms? (e.g., belching,  blood in stool, breathing difficulty, constipation, diarrhea, fever, passing gas, vomiting, weight loss, white of eyes have turned yellow)     Back and flank pain last evening that has now resolved.   Additional info: no issues with elimination. Still has appendix. No appointments available, she is currently at work and will seek evaluation at urgent care.  Protocols used: Abdominal Pain - Female-A-AH, Abdomen Bloating and Swelling-A-AH

## 2024-01-27 NOTE — Progress Notes (Signed)
 Remote pacemaker transmission.

## 2024-01-27 NOTE — Addendum Note (Signed)
 Addended by: TAWNI DRILLING D on: 01/27/2024 12:32 PM   Modules accepted: Orders

## 2024-02-17 ENCOUNTER — Other Ambulatory Visit: Payer: Self-pay | Admitting: Surgery

## 2024-02-17 DIAGNOSIS — I7121 Aneurysm of the ascending aorta, without rupture: Secondary | ICD-10-CM

## 2024-02-20 ENCOUNTER — Telehealth: Payer: Self-pay

## 2024-02-20 NOTE — Telephone Encounter (Signed)
 Pt requesting call back to schedule colonoscopy in Dec.   [phone]

## 2024-02-21 NOTE — Telephone Encounter (Signed)
 Returned phone call to patient.  LVM for her to call back to schedule.  Cardiac Clearance has been sent to [facility] in advance.  Thanks,  [place], CMA

## 2024-02-22 ENCOUNTER — Telehealth: Payer: Self-pay

## 2024-02-22 NOTE — Telephone Encounter (Signed)
   Pre-operative Risk Assessment    Patient Name: Wendy Kelly  DOB: Apr 23, 1956 MRN: [id number]   Date of last office visit: 03/18/23 [name], MD Date of next office visit: NONE   Request for Surgical Clearance    Procedure:  COLONOSCOPY  Date of Surgery:  Clearance TBD                                Surgeon:  DR ROGELIA COPPING Surgeon's Group or Practice Name:  [facility] Phone number:  [phone] Fax number:  [phone]  ATTN: MICHELLE   Type of Clearance Requested:   - Medical    Type of Anesthesia:  General    Additional requests/questions:    SignedLucie DELENA Ku   02/22/2024, 5:21 PM

## 2024-02-23 NOTE — Telephone Encounter (Signed)
   Name: Wendy Kelly  DOB: [birth year]  MRN: [id number]  Primary Cardiologist: None   Preoperative team, please contact this patient and set up a phone call appointment for further preoperative risk assessment. Please obtain consent and complete medication review. Thank you for your help.  I confirm that guidance regarding antiplatelet and oral anticoagulation therapy has been completed and, if necessary, noted below (none requested).  I also confirmed the patient resides in the state of [place] . As per [facility] Medical Board telemedicine laws, the patient must reside in the state in which the provider is licensed.   Damien JAYSON Braver, NP 02/23/2024, 4:24 PM [facility]

## 2024-02-24 NOTE — Telephone Encounter (Signed)
 Spoke with the patient to schedule her preop tele appt, patient says she would like to wait to schedule the clearance as she is unsure when he colonoscopy with be done. Says that it will most likely be in December but wants to make sure prior to scheduling the clearance. States that she will give our office a call back once she has further information.

## 2024-03-06 ENCOUNTER — Ambulatory Visit (INDEPENDENT_AMBULATORY_CARE_PROVIDER_SITE_OTHER): Payer: PPO

## 2024-03-06 DIAGNOSIS — I441 Atrioventricular block, second degree: Secondary | ICD-10-CM

## 2024-03-07 LAB — CUP PACEART REMOTE DEVICE CHECK
Battery Impedance: 690 Ohm
Battery Remaining Longevity: 87 mo
Battery Voltage: 2.78 V
Brady Statistic AP VP Percent: 0 %
Brady Statistic AP VS Percent: 10 %
Brady Statistic AS VP Percent: 0 %
Brady Statistic AS VS Percent: 90 %
Date Time Interrogation Session: 20250826053811
Implantable Lead Connection Status: 753985
Implantable Lead Connection Status: 753985
Implantable Lead Implant Date: 20061128
Implantable Lead Implant Date: 20061128
Implantable Lead Location: 753859
Implantable Lead Location: 753860
Implantable Lead Model: 4092
Implantable Lead Model: 4592
Implantable Pulse Generator Implant Date: 20161123
Lead Channel Impedance Value: 556 Ohm
Lead Channel Impedance Value: 711 Ohm
Lead Channel Pacing Threshold Amplitude: 0.625 V
Lead Channel Pacing Threshold Amplitude: 1.125 V
Lead Channel Pacing Threshold Pulse Width: 0.4 ms
Lead Channel Pacing Threshold Pulse Width: 0.4 ms
Lead Channel Setting Pacing Amplitude: 2 V
Lead Channel Setting Pacing Amplitude: 2.5 V
Lead Channel Setting Pacing Pulse Width: 0.4 ms
Lead Channel Setting Sensing Sensitivity: 2.8 mV
Zone Setting Status: 755011
Zone Setting Status: 755011

## 2024-03-11 ENCOUNTER — Ambulatory Visit: Payer: Self-pay | Admitting: Cardiology

## 2024-03-16 ENCOUNTER — Encounter: Payer: Self-pay | Admitting: Oncology

## 2024-03-16 NOTE — Telephone Encounter (Signed)
 S/W pt and informed pt that I would update the appt notes to included preop clearance.  Pt has IN OFFICE appt 03/20/24 with Barnie Hila, NP and pt wants preop clearance to be addressed at that time.  Will update the surgeons office.

## 2024-03-16 NOTE — Telephone Encounter (Signed)
 Pt calling to make pre Op aware that she has scheduled in office visit (9/9) for annual f/u and to be cleared for procedure. Please advise

## 2024-03-18 NOTE — Progress Notes (Unsigned)
 Cardiology Clinic Note   Date: 03/20/2024 ID: Dorene Bruni, DOB 23-Mar-1956, MRN [id number]  Primary Cardiologist:  [name], MD  Chief Complaint   Jylian Pappalardo is a 68 y.o. female who presents to the clinic today for preoperative evaluation.   Patient Profile   Wendy Kelly is followed by Dr. Gollan for the history outlined below.      Past medical history significant for: Aortic atherosclerosis. Second degree AV block. S/p PPM 2006. GEN change out 06/04/2015. Remote device check 03/06/2024: Normal device function.  A sensed V sensed.  Battery and lead parameters stable with stable capture and sensing.  Appropriate device programming. Ascending thoracic aortic aneurysm. Echo 01/25/2023: EF 55 to 60%.  No RWMA.  Indeterminate diastolic parameters.  Low normal RV function, normal RV size.  Trivial MR.  Aortic valve sclerosis/calcification without stenosis.  Mild dilatation of ascending aorta 41 mm. CTA chest aorta 03/19/2024: Stable 4.8 cm ascending thoracic aortic aneurysm. TIA. Hypothyroidism. Squamous cell carcinoma of the oropharynx.  In summary, patient with a long history of syncope.  She had been told repeatedly this was related to hypotension.  She underwent cardiac monitoring and was found to have second-degree heart block.  Pacemaker was inserted in 2006 with no recurrent syncope.  She has been followed by Dr. Fernande for her pacemaker.  She established care with Dr. Gollan for general cardiology in November 2012.  She has a history of ascending aortic aneurysm and is being followed by CT surgery.  Patient was last seen in the office by Dr. Fernande on 02/22/2023 for routine follow-up.  She had been evaluated in the ED in July 2024 for signs of a stroke.  Neurology was consulted and felt no objective findings but DAPT was initiated.  Upon discharge patient felt like stroke diagnosis was not made and stopped DAPT.  She was last seen by Dr. Gollan on 03/18/2023 for routine follow-up.   She reported doing well overall.  She was under significant stress at home due to caring for her elderly mother.     History of Present Illness    Today, patient is doing well. Patient denies shortness of breath, dyspnea on exertion, lower extremity edema, orthopnea or PND. No chest pain, pressure, or tightness. No palpitations. She continues to care for her elderly mother with dementia at home. She does have the assistance of a home aide. She is active doing weight training 3 days a week.     ROS: All other systems reviewed and are otherwise negative except as noted in History of Present Illness.  EKGs/Labs Reviewed    EKG Interpretation Date/Time:  Tuesday March 20 2024 09:58:39 EDT Ventricular Rate:  81 PR Interval:  168 QRS Duration:  86 QT Interval:  374 QTC Calculation: 434 R Axis:   -65  Text Interpretation: Normal sinus rhythm Left anterior fascicular block When compared with ECG of 18-Mar-2023 09:15, No significant change was found Confirmed by Loistine Sober [phone]) on 03/20/2024 10:00:34 AM   05/10/2023: ALT 22; AST 23; BUN 27; Creatinine, Ser 0.67; Potassium 4.2; Sodium 143   05/10/2023: Hemoglobin 13.6; WBC 4.1   05/10/2023: TSH 2.860    Physical Exam    VS:  BP 112/74   Pulse 81   Ht 5' 4 (1.626 m)   Wt 151 lb 3.2 oz (68.6 kg)   SpO2 97%   BMI 25.95 kg/m  , BMI Body mass index is 25.95 kg/m.  GEN: Well nourished, well developed, in no acute distress. Neck:  No JVD or carotid bruits. Cardiac:  RRR.  No murmur. No rubs or gallops.   Respiratory:  Respirations regular and unlabored. Clear to auscultation without rales, wheezing or rhonchi. GI: Soft, nontender, nondistended. Extremities: Radials/DP/PT 2+ and equal bilaterally. No clubbing or cyanosis. No edema.  Skin: Warm and dry, no rash. Neuro: Strength intact.  Assessment & Plan   Second-degree AV block S/p PPM 2006 with GEN change out November 2016.  Remote device check August 2025 showed  normal device function, a sensed/V sensed, battery and lead parameters stable with stable capture and sensing, appropriate device programming.  Patient denies lightheadedness, dizziness, presyncope or syncope. She has not been seen by EP in a year. She was being followed by Dr. Fernande.  - Schedule follow-up with EP.  Ascending thoracic aortic aneurysm CTA chest aorta September 2025 demonstrated stable 4.8 cm ascending thoracic aortic aneurysm. Patient denies chest pain, back pain, lightheadedness, dizziness, presyncope or syncope. She does light weight training 3 days a week.  - Continue to follow with CT surgery.  Preoperative cardiovascular risk assessment  Colonoscopy with Dr. Jinny. According to the RCRI, patient has a 0.9% risk of MACE. Patient reports activity equivalent to >4.0 METS (weight training 3 days a week).  -Based on ACC/AHA guidelines, Adrianna Monts would be at acceptable risk for the planned procedure without further cardiovascular testing.   Disposition: Return in 1 year or sooner as needed.          Signed, Barnie HERO. Sharmain Lastra, DNP, NP-C

## 2024-03-19 ENCOUNTER — Ambulatory Visit (HOSPITAL_COMMUNITY)
Admission: RE | Admit: 2024-03-19 | Discharge: 2024-03-19 | Disposition: A | Discharge status: Home / Self Care | Source: Ambulatory Visit | Attending: Surgery | Admitting: Surgery

## 2024-03-19 DIAGNOSIS — Z9049 Acquired absence of other specified parts of digestive tract: Secondary | ICD-10-CM | POA: Diagnosis not present

## 2024-03-19 DIAGNOSIS — I7121 Aneurysm of the ascending aorta, without rupture: Secondary | ICD-10-CM | POA: Diagnosis not present

## 2024-03-19 MED ORDER — IOHEXOL 350 MG/ML SOLN
75.0000 mL | Freq: Once | INTRAVENOUS | Status: AC | PRN
  Administered 2024-03-19: 75 mL via INTRAVENOUS

## 2024-03-20 ENCOUNTER — Encounter: Payer: Self-pay | Admitting: Student

## 2024-03-20 ENCOUNTER — Ambulatory Visit: Attending: Student | Admitting: Student

## 2024-03-20 VITALS — BP 112/74 | HR 81 | Ht 64.0 in | Wt 151.2 lb

## 2024-03-20 DIAGNOSIS — I7121 Aneurysm of the ascending aorta, without rupture: Secondary | ICD-10-CM | POA: Diagnosis not present

## 2024-03-20 DIAGNOSIS — Z950 Presence of cardiac pacemaker: Secondary | ICD-10-CM

## 2024-03-20 DIAGNOSIS — I441 Atrioventricular block, second degree: Secondary | ICD-10-CM

## 2024-03-20 DIAGNOSIS — Z01810 Encounter for preprocedural cardiovascular examination: Secondary | ICD-10-CM

## 2024-03-20 NOTE — Patient Instructions (Signed)
 Medication Instructions:  Your physician recommends that you continue on your current medications as directed. Please refer to the Current Medication list given to you today.   *If you need a refill on your cardiac medications before your next appointment, please call your pharmacy*  Lab Work: None ordered at this time  If you have labs (blood work) drawn today and your tests are completely normal, you will receive your results only by: MyChart Message (if you have MyChart) OR A paper copy in the mail If you have any lab test that is abnormal or we need to change your treatment, we will call you to review the results.  Testing/Procedures: None ordered at this time   Follow-Up: At [facility], you and your health needs are our priority.  As part of our continuing mission to provide you with exceptional heart care, our providers are all part of one team.  This team includes your primary Cardiologist (physician) and Advanced Practice Providers or APPs (Physician Assistants and Nurse Practitioners) who all work together to provide you with the care you need, when you need it.  Your next appointment:   1 year(s)  Provider:   [name], MD or Barnie Hila, NP    We recommend signing up for the patient portal called MyChart.  Sign up information is provided on this After Visit Summary.  MyChart is used to connect with patients for Virtual Visits (Telemedicine).  Patients are able to view lab/test results, encounter notes, upcoming appointments, etc.  Non-urgent messages can be sent to your provider as well.   To learn more about what you can do with MyChart, go to [email or web address].   Other Instructions  Schedule an appointment with Electrophysiology.

## 2024-03-27 ENCOUNTER — Telehealth: Payer: Self-pay

## 2024-03-27 NOTE — Telephone Encounter (Signed)
 Cardiology clearance per Barnie Hila -patient is acceptable risk for the planned procedure without further cadiovascular testing.

## 2024-03-28 NOTE — Progress Notes (Signed)
 Remote PPM Transmission

## 2024-04-03 NOTE — Patient Instructions (Signed)
 Risk Modification in those with ascending thoracic aortic aneurysm:   Continue control of blood pressure (prefer BP 130/80 or less)   2. Avoid fluoroquinolone antibiotics (I.e Ciprofloxacin, Avelox, Levofloxacin, Ofloxacin)   3.  Use of statin (to decrease cardiovascular risk)   4.  Exercise and activity limitations is individualized, but in general, contact sports are to be avoided and one should avoid heavy lifting (defined as half of ideal body weight) and exercises involving sustained Valsalva maneuver.   5.  Follow-up in one year with CTA chest.  OK to use a non-contrast CT if you have had a recent study for surveillance of the lung nodule.

## 2024-04-03 NOTE — Progress Notes (Unsigned)
 [address] [place] [address]             [phone]            Wendy Kelly [id number] 03-24-1956   History of Present Illness:  Wendy Kelly is a 68 year old female with medical history of atrial fibrillation, pacemaker, orthostatic hypotension, AV block second-degree, TIA, squamous cell carcinoma of the oropharynx, and hypothyroidism who returns for continued surveillance of ascending thoracic aortic aneurysm.  Aneurysm has stayed stable in size measuring from 4.6 cm to 4.8 cm since 2011.  Recent CTA of chest on 03/19/2024 measured aneurysm at 4.8 cm. Echocardiogram in 2024 tricuspid aortic valve.  She reports that she recently has been having more stress due to being her mother's full-time caregiver. 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 She does exercise by walking and weight training.  She does not lift heavier than 50 pounds. She denies chest pain, shortness of breath and lower leg swelling.    Current Outpatient Medications on File Prior to Visit  Medication Sig Dispense Refill   ARMOUR THYROID  PO Take 75 mg by mouth daily.     azelastine (ASTELIN) 0.1 % nasal spray SMARTSIG:1-2 Spray(s) Both Nares Every 12 Hours PRN     b complex vitamins tablet Take 2 tablets by mouth daily.     Calcium  Carb-Cholecalciferol (CALCIUM  500 + D3 PO) Take 2 Doses by mouth daily.      CRANBERRY SOFT PO Take 2 each by mouth daily.      Digestive Enzymes (DIGESTIVE ENZYME PO) Take 1-2 capsules by mouth daily.     levothyroxine  (SYNTHROID ) 75 MCG tablet TAKE 1 TABLET BY MOUTHY DAILY 90 tablet 1   NON FORMULARY Essential Oils     Prasterone, DHEA, (DHEA PO) Take 5 mg by mouth 2 (two) times daily.     PRESCRIPTION MEDICATION Place 1 tablet vaginally 3 (three) times a week. E3 1mg  and Testosterone 1mg  compound     progesterone  (PROMETRIUM ) 100 MG capsule Take 100 mg by mouth at bedtime.     sodium chloride  (MURO 128) 5 % ophthalmic solution  Place 1 drop into both eyes daily.     Current Facility-Administered Medications on File Prior to Visit  Medication Dose Route Frequency Provider Last Rate Last Admin   0.9 %  sodium chloride  infusion   Intravenous Once Rao, Archana C, MD         ROS: Review of Systems  Constitutional: Negative.  Negative for malaise/fatigue.  Respiratory: Negative.  Negative for cough, shortness of breath and wheezing.   Cardiovascular: Negative.  Negative for chest pain and leg swelling.  Neurological:  Negative for headaches.     BP 124/79 (BP Location: Left Arm)   Pulse 82   Resp 18   Ht 5' 4 (1.626 m)   Wt 149 lb (67.6 kg)   SpO2 99%   BMI 25.58 kg/m   Physical Exam Constitutional:      Appearance: Normal appearance.  HENT:     Head: Normocephalic and atraumatic.  Cardiovascular:     Rate and Rhythm: Normal rate and regular rhythm.     Heart sounds: Normal heart sounds, S1 normal and S2 normal.  Pulmonary:     Effort: Pulmonary effort is normal.     Breath sounds: Normal breath sounds.  Musculoskeletal:     Cervical back: Normal range of motion.  Skin:  General: Skin is warm and dry.  Neurological:     General: No focal deficit present.     Mental Status: She is alert.      Imaging: CLINICAL DATA:  Suspected aortic aneurysm.   EXAM: CT ANGIOGRAPHY CHEST WITH CONTRAST   TECHNIQUE: Multidetector CT imaging of the chest was performed using the standard protocol during bolus administration of intravenous contrast. Multiplanar CT image reconstructions and MIPs were obtained to evaluate the vascular anatomy.   RADIATION DOSE REDUCTION: This exam was performed according to the departmental dose-optimization program which includes automated exposure control, adjustment of the mA and/or kV according to patient size and/or use of iterative reconstruction technique.   CONTRAST:  75mL OMNIPAQUE  IOHEXOL  350 MG/ML SOLN   COMPARISON:  March 30, 2023    FINDINGS: Cardiovascular: A dual lead AICD is noted. The proximal portion of the ascending thoracic aorta measures 4.8 cm in diameter. This is stable in size and appearance when compared to the prior study. There is no evidence of aortic dissection. Satisfactory opacification of the pulmonary arteries to the segmental level. No evidence of pulmonary embolism. Normal heart size. No pericardial effusion.   Mediastinum/Nodes: No enlarged mediastinal, hilar, or axillary lymph nodes. Thyroid  gland, trachea, and esophagus demonstrate no significant findings.   Lungs/Pleura: Lungs are clear. No pleural effusion or pneumothorax.   Upper Abdomen: A 1.2 cm diameter hepatic cyst versus hemangioma is seen within the anteromedial aspect of the right lobe of the liver.   Surgical clips are seen within the gallbladder fossa.   Mild, stable diffuse bilateral adrenal gland enlargement is noted.   Musculoskeletal: No chest wall abnormality. No acute or significant osseous findings.   Review of the MIP images confirms the above findings.   IMPRESSION: 1. Stable 4.8 cm ascending thoracic aortic aneurysm. 2. No evidence of pulmonary embolism or other acute intrathoracic process. 3. Evidence of prior cholecystectomy.     Electronically Signed   By: Suzen Dials M.D.   On: 03/19/2024 14:30       A/P:  Aneurysm of ascending aorta without rupture -4.8 cm ascending thoracic aortic aneurysm on CTA of chest.  Echocardiogram shows tricuspid aortic valve.We discussed the natural history and and risk factors for growth of ascending aortic aneurysms. Discussed recommendations to minimize the risk of further expansion or dissection including careful blood pressure control, avoidance of contact sports and heavy lifting, attention to lipid management.  We covered the importance of staying never user of tobacco.  The patient does not yet meet surgical criteria of >5.5cm. The patient is aware of signs  and symptoms of aortic dissection and when to present to the emergency department   -Follow up in one year with CTA of chest    Risk Modification:  Statin:  not currently prescribed  Smoking cessation instruction/counseling given:  never user  Patient was counseled on importance of Blood Pressure Control  They are instructed to contact their Primary Care Physician if they start to have blood pressure readings over 130s/90s. Do not ever stop blood pressure medications on your own, unless instructed by healthcare professional.  Please avoid use of Fluoroquinolones as this can potentially increase your risk of Aortic Rupture and/or Dissection  Patient educated on signs and symptoms of Aortic Dissection, handout also provided in AVS  Manuelita CHRISTELLA Rough, PA-C 04/04/24

## 2024-04-04 ENCOUNTER — Ambulatory Visit

## 2024-04-04 VITALS — BP 124/79 | HR 82 | Resp 18 | Ht 64.0 in | Wt 149.0 lb

## 2024-04-04 DIAGNOSIS — I7121 Aneurysm of the ascending aorta, without rupture: Secondary | ICD-10-CM | POA: Diagnosis not present

## 2024-04-13 NOTE — Telephone Encounter (Signed)
 The patient left a voicemail inquiring about the status of her upcoming procedure. She mentioned that her cardiologist has been involved in her care and that it has been a few weeks since she last received an update. 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  I returned the patient's call to confirm we received her message and subsequently transferred her to Nurse [facility] for further assistance regarding her procedure.

## 2024-04-13 NOTE — Telephone Encounter (Signed)
 Spoke with patient to let her know we have not received the clearance from cardiology and will fax again. I reminded patient that once we get the clearance we could schedule her once we know the January schedule and call her.

## 2024-04-16 NOTE — Telephone Encounter (Signed)
 Pre-op risk assessment performed at office visit on 03/20/2024. Office note was forwarded to requesting office.   Will remove from pre-op pool.   Barnie HERO. Arlett Goold, DNP, NP-C  04/16/2024, 9:14 AM [facility] [address] Office [phone] Fax [phone]

## 2024-04-16 NOTE — Telephone Encounter (Signed)
 Requesting office sent duplicate, inquiring if pt has been cleared. Pt was seen by Barnie Hila, NP 03/20/24.

## 2024-04-16 NOTE — Telephone Encounter (Signed)
 Per 03/20/24 Cardiac Office visit patient is clear to have her colonoscopy.  Preoperative cardiovascular risk assessment  Colonoscopy with Dr. Jinny. According to the RCRI, patient has a 0.9% risk of MACE. Patient reports activity equivalent to >4.0 METS (weight training 3 days a week).  -Based on ACC/AHA guidelines, Wendy Kelly would be at acceptable risk for the planned procedure without further cardiovascular testing.     Voice message has been left to let patient know clearance has been received and granted.  She will be contacted once scope schedule is available for [facility].  Thanks,  [place], CMA

## 2024-04-24 NOTE — Progress Notes (Unsigned)
  Electrophysiology Office Follow up Visit Note:    Date:  04/25/2024   ID:  Wendy Kelly, DOB [birth year], MRN [id number]  PCP:  Vicci Duwaine SQUIBB, DO  [facility] Cardiologist:  [name], MD  [facility] Electrophysiologist:  Fonda Kitty, MD    Interval History:     Wendy Kelly is a 68 y.o. female who presents for a follow up visit.   The patient was last seen by Dr. Fernande February 22, 2023.  She has intermittent second-degree AV block and syncope for which a pacemaker was implanted by Dr. Fernande in 2006.  She had a generator replacement in 2016.  Today she is doing well.  No complaints.      Past medical, surgical, social and family history were reviewed.  ROS:   Please see the history of present illness.    All other systems reviewed and are negative.  EKGs/Labs/Other Studies Reviewed:    The following studies were reviewed today:  April 25, 2024 in-clinic device interrogation personally reviewed Battery and lead parameter stable        Physical Exam:    VS:  BP 110/72 (BP Location: Left Arm, Patient Position: Sitting, Cuff Size: Normal)   Pulse 88   Ht 5' 4 (1.626 m)   Wt 153 lb (69.4 kg)   BMI 26.26 kg/m     Wt Readings from Last 3 Encounters:  04/25/24 153 lb (69.4 kg)  04/04/24 149 lb (67.6 kg)  03/20/24 151 lb 3.2 oz (68.6 kg)     GEN: no distress CARD: RRR, No MRG.  Device pocket well-healed RESP: No IWOB. CTAB.      ASSESSMENT:    1. Second degree heart block   2. Presence of cardiac pacemaker   3. AV block, 2nd degree   4. Aneurysm of ascending aorta without rupture    PLAN:    In order of problems listed above:  #Syncope #Intermittent AV block #Permanent pacemaker in situ Device functioning appropriately.  Continue remote monitoring  #Ascending aortic aneurysm Patient follows with cardiothoracic surgery  I discussed my upcoming departure from [facility] health today.  I will transition her care to one of my colleagues,  Dr. Kitty.  Follow-up 1 year with APP   Signed, [name], MD, [facility], [facility] 04/25/2024 9:51 AM    Electrophysiology [facility]

## 2024-04-25 ENCOUNTER — Ambulatory Visit: Payer: Self-pay | Admitting: Cardiology

## 2024-04-25 ENCOUNTER — Encounter: Payer: Self-pay | Admitting: Cardiology

## 2024-04-25 ENCOUNTER — Ambulatory Visit: Attending: Cardiology | Admitting: Cardiology

## 2024-04-25 VITALS — BP 110/72 | HR 88 | Ht 64.0 in | Wt 153.0 lb

## 2024-04-25 DIAGNOSIS — I7121 Aneurysm of the ascending aorta, without rupture: Secondary | ICD-10-CM | POA: Diagnosis not present

## 2024-04-25 DIAGNOSIS — I441 Atrioventricular block, second degree: Secondary | ICD-10-CM | POA: Diagnosis not present

## 2024-04-25 DIAGNOSIS — Z950 Presence of cardiac pacemaker: Secondary | ICD-10-CM | POA: Diagnosis not present

## 2024-04-25 LAB — CUP PACEART INCLINIC DEVICE CHECK
Date Time Interrogation Session: 20251015104835
Implantable Lead Connection Status: 753985
Implantable Lead Connection Status: 753985
Implantable Lead Implant Date: 20061128
Implantable Lead Implant Date: 20061128
Implantable Lead Location: 753859
Implantable Lead Location: 753860
Implantable Lead Model: 4092
Implantable Lead Model: 4592
Implantable Pulse Generator Implant Date: 20161123

## 2024-04-25 NOTE — Patient Instructions (Signed)
 Medication Instructions:  Your physician recommends that you continue on your current medications as directed. Please refer to the Current Medication list given to you today.  *If you need a refill on your cardiac medications before your next appointment, please call your pharmacy*  Follow-Up: At [facility], you and your health needs are our priority.  As part of our continuing mission to provide you with exceptional heart care, our providers are all part of one team.  This team includes your primary Cardiologist (physician) and Advanced Practice Providers or APPs (Physician Assistants and Nurse Practitioners) who all work together to provide you with the care you need, when you need it.  Your next appointment:   1 year  Provider:   Fonda Kitty, MD or Suzann Riddle, NP

## 2024-05-11 ENCOUNTER — Ambulatory Visit (INDEPENDENT_AMBULATORY_CARE_PROVIDER_SITE_OTHER): Payer: Self-pay | Admitting: Family Medicine

## 2024-05-11 ENCOUNTER — Encounter: Payer: Self-pay | Admitting: Family Medicine

## 2024-05-11 VITALS — BP 122/83 | HR 79 | Temp 97.8°F | Ht 64.0 in | Wt 152.6 lb

## 2024-05-11 DIAGNOSIS — Z136 Encounter for screening for cardiovascular disorders: Secondary | ICD-10-CM

## 2024-05-11 DIAGNOSIS — I480 Paroxysmal atrial fibrillation: Secondary | ICD-10-CM | POA: Diagnosis not present

## 2024-05-11 DIAGNOSIS — Z1231 Encounter for screening mammogram for malignant neoplasm of breast: Secondary | ICD-10-CM

## 2024-05-11 DIAGNOSIS — E039 Hypothyroidism, unspecified: Secondary | ICD-10-CM

## 2024-05-11 DIAGNOSIS — D692 Other nonthrombocytopenic purpura: Secondary | ICD-10-CM | POA: Diagnosis not present

## 2024-05-11 DIAGNOSIS — Z0000 Encounter for general adult medical examination without abnormal findings: Secondary | ICD-10-CM | POA: Diagnosis not present

## 2024-05-11 NOTE — Progress Notes (Signed)
 BP 122/83   Pulse 79   Temp 97.8 F (36.6 C) (Oral)   Ht 5' 4 (1.626 m)   Wt 152 lb 9.6 oz (69.2 kg)   SpO2 97%   BMI 26.19 kg/m    Subjective:    Patient ID: Wendy Kelly, female    DOB: 1955/11/15, 68 y.o.   MRN: [id number]  HPI: Wendy Kelly is a 68 y.o. female presenting on 05/11/2024 for comprehensive medical examination. Current medical complaints include:  HYPOTHYROIDISM Thyroid  control status:controlled Satisfied with current treatment? no Medication side effects: no Medication compliance: excellent compliance Recent dose adjustment:yes Fatigue: yes Cold intolerance: no Heat intolerance: no Weight gain: no Weight loss: no Constipation: no Diarrhea/loose stools: no Palpitations: no Lower extremity edema: no Anxiety/depressed mood: no  She currently lives with: mom Menopausal Symptoms: no  Depression Screen done today and results listed below:     05/11/2024   10:27 AM 06/21/2023    9:58 AM 05/10/2023    8:16 AM 02/14/2023   10:09 AM 08/02/2022    4:25 PM  Depression screen PHQ 2/9  Decreased Interest 0 0 0 0 0  Down, Depressed, Hopeless 0 0 0 0 0  PHQ - 2 Score 0 0 0 0 0  Altered sleeping 0 0  0 1  Tired, decreased energy 0 0  0 1  Change in appetite 0 0  0 0  Feeling bad or failure about yourself  0 0  0 0  Trouble concentrating 0 0  0 0  Moving slowly or fidgety/restless 0 0  0 0  Suicidal thoughts 0 0  0 0  PHQ-9 Score 0 0  0 2  Difficult doing work/chores  Not difficult at all  Not difficult at all Not difficult at all    Past Medical History:  Past Medical History:  Diagnosis Date   AA (aortic aneurysm)    a. 04/2011 Echo: Ao Root: 4.1cm, Asc Ao 4.7cm.   Anxiety    AV block, 2nd degree    a. 05/2005 - s/p MDT Adapta ADDR01 Dual Chamber PPM   Bicuspid aortic valve    Cancer (HCC)    Throat   Chest pain    a. 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   COVID-19 virus infection 09/11/2019   Deep vein thrombosis (DVT) of left lower extremity (HCC)  10/03/2017   Dehydration 08/11/2017   Depression    Expressive aphasia    a. ongoing since 04/2011 - seen by neurology - ? TIA vs. Migraine   Facial numbness    a. ongoing since 04/2011 - seen by neurology - ? TIA vs. 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 04/2011 Echo: EF 55-65%, No RWMA, Gr 1 DD.   Thyroid  disease     Surgical History:  Past Surgical History:  Procedure Laterality Date   ABDOMINAL WALL MESH  REMOVAL     bladder tack     cataract Bilateral 2004   CHOLECYSTECTOMY  2000   COLONOSCOPY WITH PROPOFOL  N/A 06/26/2019   Procedure: COLONOSCOPY WITH PROPOFOL ;  Surgeon: Jinny Carmine, MD;  Location: [facility] END[facility]COPY;  Service: Endoscopy;  Laterality: N/A;   DILATION AND CURETTAGE OF UTERUS     DIRECT LARYNGOSCOPY Right 06/16/2017   Procedure: MICRO DIRECT LARYNGOSCOPY WITH BIOPSY OF RIGHT BASE OF TONGUE;  Surgeon: Milissa Hamming, MD;  Location: [facility] ORS;  Service: ENT;  Laterality: Right;   EP IMPLANTABLE DEVICE N/A 06/04/2015   Procedure: PPM Generator Changeout;  Surgeon: Elspeth JAYSON Sage, MD;  Location: [facility] INVASIVE CV LAB;  Service: Cardiovascular;  Laterality: N/A;   ESOPHAGOGASTRODUODENOSCOPY (EGD) WITH PROPOFOL  N/A 12/18/2015   Procedure: ESOPHAGOGASTRODUODENOSCOPY (EGD) WITH gastric biopsy and dilation;  Surgeon: Rogelia Copping, MD;  Location: [facility];  Service: Endoscopy;  Laterality: N/A;   EYE SURGERY     INSERT / REPLACE / REMOVE PACEMAKER     IR FLUORO GUIDE PORT INSERTION RIGHT  06/22/2017   IR REMOVAL TUN ACCESS W/ PORT W/O FL MOD SED  04/14/2018   MOHS SURGERY  02/2018   PACEMAKER INSERTION  2006   Medtronic Adapta ADDR01    TONSILLECTOMY     TONSILLECTOMY     VAGINAL HYSTERECTOMY  2013   mad/cope    Medications:  Current Outpatient Medications on File Prior to Visit  Medication Sig   ARMOUR THYROID  PO Take 75 mg by mouth daily. (Patient taking differently: Take 75 mg by mouth daily. TAKING 90mg )   azelastine (ASTELIN) 0.1 % nasal spray SMARTSIG:1-2 Spray(s) Both Nares Every 12 Hours PRN   b complex vitamins tablet Take 2 tablets by mouth daily.   Calcium  Carb-Cholecalciferol (CALCIUM  500 + D3 PO) Take 2 Doses by mouth daily.    CRANBERRY SOFT PO Take 2 each by mouth daily.    Digestive Enzymes (DIGESTIVE ENZYME PO) Take 1-2 capsules by mouth daily.   NON FORMULARY Essential Oils   Prasterone, DHEA, (DHEA PO) Take 5 mg by mouth 2 (two) times daily.   PRESCRIPTION MEDICATION Place 1 tablet vaginally 3 (three) times a week. E3 1mg  and Testosterone 1mg  compound   progesterone  (PROMETRIUM ) 100 MG capsule Take 100 mg by mouth at bedtime.   sodium chloride  (MURO 128) 5 % ophthalmic solution Place 1 drop into both eyes daily.   Current Facility-Administered Medications on File Prior to Visit  Medication   0.9 %  sodium chloride  infusion    Allergies:  Allergies  Allergen Reactions   Cefdinir Swelling    Swelling in mouth    Cephalosporins Swelling   Dairy Aid [Tilactase] Swelling    Milk and milk products cause swelling and tingling of tongue   Flagyl [Metronidazole] Swelling    Mouth and throat and ears go red and burn   Lactose Intolerance (Gi) Other (See Comments)    GI upset.  Eats only gluten free   Penicillins Rash    Rash/hives   Phenylephrine -Guaifenesin Anaphylaxis    Night terrors   Sulfa Antibiotics Rash and Hives    Other reaction(s): Unknown    Sulfonamide Derivatives Rash    Rash/itching.  From head to her toes, the itching was horrible. Penicillin is the worst   Tape Rash    Adhesives all cause a problem severely.  It doesn't matter if it is paper tape or not.   Dexilant   [Dexlansoprazole ]     Pressure in head like head is in a vice and being squeezed   Entex Lq [Phenylephrine -Guaifenesin]     Night terrors   Other Other (See Comments)    Flu vaccine:  Weakness, dizziness, tia type symptoms.  Oxycodone Nausea And Vomiting    And itching   Succinylcholine Other (See Comments)    Trouble waking up   Vancomycin  Rash   Clarithromycin  Itching and Other (See Comments)   Haemophilus Influenzae Vaccines Other (See Comments)    Stroke like symptoms   Quinolones Other (See Comments)    Ascending thoracic aortic aneurysm    Tetracycline Other (See Comments)    Unkown   Codeine Nausea And Vomiting   Guaifenesin & Derivatives Other (See Comments)    Night terrors   Lactobacillus Itching   Naproxen Sodium     Itching/rash   Pseudoephedrine Rash   Tetracyclines & Related Other (See Comments)    Was taking flagyl at the same time; unsure which caused the swelling  Swelling of throat. Pharmacist states that he believes it was the flagyl and not the tetracycline   Wheat Other (See Comments)    Upset stomach. Now eats gluten free    Social History:  Social History   Socioeconomic History   Marital status: Married    Spouse name: Fairy   Number of children: 2   Years of education: Not on file   Highest education level: Not on file  Occupational History   Occupation: massage therapist  Tobacco Use   Smoking status: Never   Smokeless tobacco: Never   Tobacco comments:    tobacco use -no  Vaping Use   Vaping status: Never Used  Substance and Sexual Activity   Alcohol use: No   Drug use: No   Sexual activity: Yes    Birth control/protection: Surgical  Other Topics Concern   Not on file  Social History Narrative   Lives in [place] with husband.  Works out regularly.  Works as a teacher, adult education - owns own business.    Social Drivers of Corporate Investment Banker Strain: Low Risk  (05/11/2024)   Overall Financial Resource Strain  (CARDIA)    Difficulty of Paying Living Expenses: Not hard at all  Food Insecurity: No Food Insecurity (05/11/2024)   Hunger Vital Sign    Worried About Running Out of Food in the Last Year: Never true    Ran Out of Food in the Last Year: Never true  Transportation Needs: No Transportation Needs (05/11/2024)   PRAPARE - Administrator, Civil Service (Medical): No    Lack of Transportation (Non-Medical): No  Physical Activity: Insufficiently Active (05/11/2024)   Exercise Vital Sign    Days of Exercise per Week: 3 days    Minutes of Exercise per Session: 30 min  Stress: No Stress Concern Present (05/11/2024)   [employer] of Occupational Health - Occupational Stress Questionnaire    Feeling of Stress: Only a little  Social Connections: Moderately Integrated (05/11/2024)   Social Connection and Isolation Panel    Frequency of Communication with Friends and Family: More than three times a week    Frequency of Social Gatherings with Friends and Family: More than three times a week    Attends Religious Services: More than 4 times per year    Active Member of [employer] or Organizations: No    Attends Banker Meetings: Never    Marital Status: Married  Catering Manager Violence: Not At Risk (05/11/2024)   Humiliation, Afraid, Rape, and Kick questionnaire    Fear of Current or Ex-Partner: No    Emotionally Abused: No    Physically Abused: No    Sexually Abused: No   Social History  Tobacco Use  Smoking Status Never  Smokeless Tobacco Never  Tobacco Comments   tobacco use -no   Social History   Substance and Sexual Activity  Alcohol Use No    Family History:  Family History  Problem Relation Age of Onset   COPD Mother        alive @ 59   Atrial fibrillation Mother    Stroke Father        died @ 50   Breast cancer Maternal Aunt 49   COPD Maternal Grandmother    Ovarian cancer Neg Hx    Diabetes Neg Hx     Past medical history, surgical  history, medications, allergies, family history and social history reviewed with patient today and changes made to appropriate areas of the chart.   Review of Systems  Constitutional: Negative.   HENT: Negative.    Eyes: Negative.   Respiratory: Negative.    Cardiovascular: Negative.   Gastrointestinal: Negative.   Genitourinary: Negative.   Musculoskeletal: Negative.   Skin: Negative.   Neurological: Negative.   Endo/Heme/Allergies: Negative.   Psychiatric/Behavioral: Negative.     All other ROS negative except what is listed above and in the HPI.      Objective:    BP 122/83   Pulse 79   Temp 97.8 F (36.6 C) (Oral)   Ht 5' 4 (1.626 m)   Wt 152 lb 9.6 oz (69.2 kg)   SpO2 97%   BMI 26.19 kg/m   Wt Readings from Last 3 Encounters:  05/11/24 152 lb 9.6 oz (69.2 kg)  04/25/24 153 lb (69.4 kg)  04/04/24 149 lb (67.6 kg)    Physical Exam Vitals and nursing note reviewed.  Constitutional:      General: She is not in acute distress.    Appearance: Normal appearance. She is not ill-appearing, toxic-appearing or diaphoretic.  HENT:     Head: Normocephalic and atraumatic.     Right Ear: Tympanic membrane, ear canal and external ear normal. There is no impacted cerumen.     Left Ear: Tympanic membrane, ear canal and external ear normal. There is no impacted cerumen.     Nose: Nose normal. No congestion or rhinorrhea.     Mouth/Throat:     Mouth: Mucous membranes are moist.     Pharynx: Oropharynx is clear. No oropharyngeal exudate or posterior oropharyngeal erythema.  Eyes:     General: No scleral icterus.       Right eye: No discharge.        Left eye: No discharge.     Extraocular Movements: Extraocular movements intact.     Conjunctiva/sclera: Conjunctivae normal.     Pupils: Pupils are equal, round, and reactive to light.  Neck:     Vascular: No carotid bruit.  Cardiovascular:     Rate and Rhythm: Normal rate and regular rhythm.     Pulses: Normal pulses.      Heart sounds: No murmur heard.    No friction rub. No gallop.  Pulmonary:     Effort: Pulmonary effort is normal. No respiratory distress.     Breath sounds: Normal breath sounds. No stridor. No wheezing, rhonchi or rales.  Chest:     Chest wall: No tenderness.  Abdominal:     General: Abdomen is flat. Bowel sounds are normal. There is no distension.     Palpations: Abdomen is soft. There is no mass.     Tenderness: There is no abdominal tenderness. There is no right  CVA tenderness, left CVA tenderness, guarding or rebound.     Hernia: No hernia is present.  Genitourinary:    Comments: Breast and pelvic exams deferred with shared decision making Musculoskeletal:        General: No swelling, tenderness, deformity or signs of injury.     Cervical back: Normal range of motion and neck supple. No rigidity. No muscular tenderness.     Right lower leg: No edema.     Left lower leg: No edema.  Lymphadenopathy:     Cervical: No cervical adenopathy.  Skin:    General: Skin is warm and dry.     Capillary Refill: Capillary refill takes less than 2 seconds.     Coloration: Skin is not jaundiced or pale.     Findings: No bruising, erythema, lesion or rash.  Neurological:     General: No focal deficit present.     Mental Status: She is alert and oriented to person, place, and time. Mental status is at baseline.     Cranial Nerves: No cranial nerve deficit.     Sensory: No sensory deficit.     Motor: No weakness.     Coordination: Coordination normal.     Gait: Gait normal.     Deep Tendon Reflexes: Reflexes normal.  Psychiatric:        Mood and Affect: Mood normal.        Behavior: Behavior normal.        Thought Content: Thought content normal.        Judgment: Judgment normal.     Results for orders placed or performed in visit on 04/25/24  CUP PACEART [facility] DEVICE CHECK   Collection Time: 04/25/24 10:48 AM  Result Value Ref Range   Pulse Generator Manufacturer MERM    Date  Time Interrogation Session [id number]    Pulse Gen Model [id number] Adapta    Pulse Gen Serial Number [id number] [facility]    Clinic [facility]    Implantable Pulse Generator Type Implantable Pulse Generator    Implantable Pulse Generator Implant Date 79838876    Implantable Lead Manufacturer MERM    Implantable Lead Model 4092 CapSure SP Novus    Implantable Lead Serial Number [id number]    Implantable Lead Implant Date 79938871    Implantable Lead Location [id number]    Implantable Lead Connection Status [id number]    Implantable Lead Manufacturer MERM    Implantable Lead Model 4592 CapSure SP Novus    Implantable Lead Serial Number [id number]    Implantable Lead Implant Date 79938871    Implantable Lead Location [id number]    Implantable Lead Connection Status [id number]    Eval Rhythm AS/VS 88       Assessment & Plan:   Problem List Items Addressed This Visit       Cardiovascular and Mediastinum   Atrial fibrillation (HCC)   Stable. Continue to follow with cardiology. Call with any concerns.       Senile purpura   Reassured patient. Call with any concerns.       Relevant Orders   CBC with Differential/Platelet     Endocrine   Hypothyroidism   Rechecking labs today. Await results. Treat as needed.       Relevant Orders   Thyroid  Panel With TSH   Other Visit Diagnoses       Routine general medical examination at a health care facility    -  Primary   Vaccines up to date/declined. Screening labs checked today. Pap  N/A. Mammo ordered. DEXA up to date. Colonoscopy scheduled. Continue diet and exercise.     Screening for cardiovascular condition       Labs drawn today. Await results. Treat as needed.   Relevant Orders   Comprehensive metabolic panel with GFR   Lipid Panel w/o Chol/HDL Ratio     Encounter for screening mammogram for malignant neoplasm of breast       Mammo ordered today.   Relevant Orders   MM 3D SCREENING MAMMOGRAM BILATERAL BREAST        Follow up  plan: Return in about 1 year (around 05/11/2025) for physical.   LABORATORY TESTING:  - Pap smear: not applicable  IMMUNIZATIONS:   - Tdap: Tetanus vaccination status reviewed: last tetanus booster within 10 years. - Influenza: N/A - Pneumovax: Refused - Prevnar: Refused - COVID: Refused - HPV: Not applicable - Shingrix vaccine: Refused  SCREENING: -Mammogram: Ordered today  - Colonoscopy: Ordered today  - Bone Density: Up to date   PATIENT COUNSELING:   Advised to take 1 mg of folate supplement per day if capable of pregnancy.   Sexuality: Discussed sexually transmitted diseases, partner selection, use of condoms, avoidance of unintended pregnancy  and contraceptive alternatives.   Advised to avoid cigarette smoking.  I discussed with the patient that most people either abstain from alcohol or drink within safe limits (<=14/week and <=4 drinks/occasion for males, <=7/weeks and <= 3 drinks/occasion for females) and that the risk for alcohol disorders and other health effects rises proportionally with the number of drinks per week and how often a drinker exceeds daily limits.  Discussed cessation/primary prevention of drug use and availability of treatment for abuse.   Diet: Encouraged to adjust caloric intake to maintain  or achieve ideal body weight, to reduce intake of dietary saturated fat and total fat, to limit sodium intake by avoiding high sodium foods and not adding table salt, and to maintain adequate dietary potassium and calcium  preferably from fresh fruits, vegetables, and low-fat dairy products.    stressed the importance of regular exercise  Injury prevention: Discussed safety belts, safety helmets, smoke detector, smoking near bedding or upholstery.   Dental health: Discussed importance of regular tooth brushing, flossing, and dental visits.    NEXT PREVENTATIVE PHYSICAL DUE IN 1 YEAR. Return in about 1 year (around 05/11/2025) for  physical.

## 2024-05-11 NOTE — Patient Instructions (Signed)
 Please call to schedule your mammogram and/or bone density: [facility] at [facility]  Address: [address], [place], [place] [address] Phone: [phone]  [facility] [address] [place],  [place]  [address] Phone: [phone]

## 2024-05-11 NOTE — Assessment & Plan Note (Signed)
Reassured patient. Call with any concerns.  

## 2024-05-11 NOTE — Assessment & Plan Note (Signed)
 Rechecking labs today. Await results. Treat as needed.

## 2024-05-11 NOTE — Assessment & Plan Note (Signed)
Stable. Continue to follow with cardiology. Call with any concerns.  

## 2024-05-12 LAB — COMPREHENSIVE METABOLIC PANEL WITH GFR
ALT: 19 IU/L (ref 0–32)
AST: 21 IU/L (ref 0–40)
Albumin: 4.3 g/dL (ref 3.9–4.9)
Alkaline Phosphatase: 77 IU/L (ref 49–135)
BUN/Creatinine Ratio: 29 — ABNORMAL HIGH (ref 12–28)
BUN: 22 mg/dL (ref 8–27)
Bilirubin Total: 0.8 mg/dL (ref 0.0–1.2)
CO2: 23 mmol/L (ref 20–29)
Calcium: 9.5 mg/dL (ref 8.7–10.3)
Chloride: 106 mmol/L (ref 96–106)
Creatinine, Ser: 0.77 mg/dL (ref 0.57–1.00)
Globulin, Total: 1.8 g/dL (ref 1.5–4.5)
Glucose: 108 mg/dL — ABNORMAL HIGH (ref 70–99)
Potassium: 3.8 mmol/L (ref 3.5–5.2)
Sodium: 143 mmol/L (ref 134–144)
Total Protein: 6.1 g/dL (ref 6.0–8.5)
eGFR: 83 mL/min/1.73 (ref >59)

## 2024-05-12 LAB — LIPID PANEL W/O CHOL/HDL RATIO
Cholesterol, Total: 191 mg/dL (ref 100–199)
HDL: 72 mg/dL (ref >39)
LDL Chol Calc (NIH): 100 mg/dL — ABNORMAL HIGH (ref 0–99)
Triglycerides: 109 mg/dL (ref 0–149)
VLDL Cholesterol Cal: 19 mg/dL (ref 5–40)

## 2024-05-12 LAB — CBC WITH DIFFERENTIAL/PLATELET
Basophils Absolute: 0 x10E3/uL (ref 0.0–0.2)
Basos: 1 %
EOS (ABSOLUTE): 0.1 x10E3/uL (ref 0.0–0.4)
Eos: 3 %
Hematocrit: 42.3 % (ref 34.0–46.6)
Hemoglobin: 13.7 g/dL (ref 11.1–15.9)
Immature Grans (Abs): 0 x10E3/uL (ref 0.0–0.1)
Immature Granulocytes: 0 %
Lymphocytes Absolute: 0.9 x10E3/uL (ref 0.7–3.1)
Lymphs: 20 %
MCH: 30.3 pg (ref 26.6–33.0)
MCHC: 32.4 g/dL (ref 31.5–35.7)
MCV: 94 fL (ref 79–97)
Monocytes Absolute: 0.4 x10E3/uL (ref 0.1–0.9)
Monocytes: 9 %
Neutrophils Absolute: 3 x10E3/uL (ref 1.4–7.0)
Neutrophils: 67 %
Platelets: 142 x10E3/uL — ABNORMAL LOW (ref 150–450)
RBC: 4.52 x10E6/uL (ref 3.77–5.28)
RDW: 12.3 % (ref 11.7–15.4)
WBC: 4.5 x10E3/uL (ref 3.4–10.8)

## 2024-05-12 LAB — THYROID PANEL WITH TSH
Free Thyroxine Index: 1.5 (ref 1.2–4.9)
T3 Uptake Ratio: 27 % (ref 24–39)
T4, Total: 5.7 ug/dL (ref 4.5–12.0)
TSH: 3.3 u[IU]/mL (ref 0.450–4.500)

## 2024-05-17 ENCOUNTER — Ambulatory Visit: Payer: Self-pay | Admitting: Family Medicine

## 2024-05-17 MED ORDER — THYROID 90 MG PO TABS: 90.0000 mg | ORAL_TABLET | Freq: Every day | ORAL | 3 refills | Status: AC

## 2024-06-04 ENCOUNTER — Other Ambulatory Visit: Payer: Self-pay

## 2024-06-04 ENCOUNTER — Telehealth: Payer: Self-pay

## 2024-06-04 DIAGNOSIS — Z860100 Personal history of colon polyps, unspecified: Secondary | ICD-10-CM

## 2024-06-04 MED ORDER — NA SULFATE-K SULFATE-MG SULF 17.5-3.13-1.6 GM/177ML PO SOLN
1.0000 | Freq: Once | ORAL | 0 refills | Status: AC
Start: 2024-06-04 — End: 2024-06-04

## 2024-06-04 NOTE — Telephone Encounter (Signed)
 Per 03/20/24 Cardiac Office visit patient is clear to have her colonoscopy.   Preoperative cardiovascular risk assessment  Colonoscopy with Dr. Jinny. According to the RCRI, patient has a 0.9% risk of MACE. Patient reports activity equivalent to >4.0 METS (weight training 3 days a week).  -Based on ACC/AHA guidelines, Wendy Kelly would be at acceptable risk for the planned procedure without further cardiovascular testing.      Voice message has been left to let patient know clearance has been received and granted.  She will be contacted once scope schedule is available for [facility].   ThanksRosaline, [facility]    Gastroenterology Pre-Procedure Review  Request Date: 08/14/24 Requesting Physician: Dr. Jinny  PATIENT REVIEW QUESTIONS: The patient responded to the following health history questions as indicated:    1. Are you having any GI issues? no 2. Do you have a personal history of Polyps? yes (12/)15/20 performed by Dr. Jinny recommended repeat in 5 years 3. Do you have a family history of Colon Cancer or Polyps? yes (grandmother colon cancer) 4. Diabetes Mellitus? no 5. Joint replacements in the past 12 months?no 6. Major health problems in the past 3 months?no 7. Any artificial heart valves, MVP, or defibrillator?no    MEDICATIONS & ALLERGIES:    Patient reports the following regarding taking any anticoagulation/antiplatelet therapy:   Plavix , Coumadin, Eliquis, Xarelto , Lovenox , Pradaxa, Brilinta, or Effient? no Aspirin ? no  Patient confirms/reports the following medications:  Current Outpatient Medications  Medication Sig Dispense Refill   azelastine (ASTELIN) 0.1 % nasal spray SMARTSIG:1-2 Spray(s) Both Nares Every 12 Hours PRN     b complex vitamins tablet Take 2 tablets by mouth daily.     Calcium  Carb-Cholecalciferol (CALCIUM  500 + D3 PO) Take 2 Doses by mouth daily.      CRANBERRY SOFT PO Take 2 each by mouth daily.      Digestive Enzymes (DIGESTIVE ENZYME PO) Take 1-2  capsules by mouth daily.     NON FORMULARY Essential Oils     Prasterone, DHEA, (DHEA PO) Take 5 mg by mouth 2 (two) times daily.     PRESCRIPTION MEDICATION Place 1 tablet vaginally 3 (three) times a week. E3 1mg  and Testosterone 1mg  compound     progesterone  (PROMETRIUM ) 100 MG capsule Take 100 mg by mouth at bedtime.     sodium chloride  (MURO 128) 5 % ophthalmic solution Place 1 drop into both eyes daily.     thyroid  (ARMOUR THYROID ) 90 MG tablet Take 1 tablet (90 mg total) by mouth daily. TAKING 90mg  90 tablet 3   No current facility-administered medications for this visit.   Facility-Administered Medications Ordered in Other Visits  Medication Dose Route Frequency Provider Last Rate Last Admin   0.9 %  sodium chloride  infusion   Intravenous Once Rao, Archana C, MD        Patient confirms/reports the following allergies:  Allergies  Allergen Reactions   Cefdinir Swelling    Swelling in mouth    Cephalosporins Swelling   Dairy Aid [Tilactase] Swelling    Milk and milk products cause swelling and tingling of tongue   Flagyl [Metronidazole] Swelling    Mouth and throat and ears go red and burn   Lactose Intolerance (Gi) Other (See Comments)    GI upset.  Eats only gluten free   Penicillins Rash    Rash/hives   Phenylephrine -Guaifenesin Anaphylaxis    Night terrors   Sulfa Antibiotics Rash and Hives    Other reaction(s): Unknown  Sulfonamide Derivatives Rash    Rash/itching.  From head to her toes, the itching was horrible. Penicillin is the worst   Tape Rash    Adhesives all cause a problem severely.  It doesn't matter if it is paper tape or not.   Dexilant  [Dexlansoprazole ]     Pressure in head like head is in a vice and being squeezed   Entex Lq [Phenylephrine -Guaifenesin]     Night terrors   Other Other (See Comments)    Flu vaccine:  Weakness, dizziness, tia type symptoms.     Oxycodone Nausea And Vomiting    And itching   Succinylcholine Other (See Comments)     Trouble waking up   Vancomycin  Rash   Clarithromycin  Itching and Other (See Comments)   Haemophilus Influenzae Vaccines Other (See Comments)    Stroke like symptoms   Quinolones Other (See Comments)    Ascending thoracic aortic aneurysm    Tetracycline Other (See Comments)    Unkown   Codeine Nausea And Vomiting   Guaifenesin & Derivatives Other (See Comments)    Night terrors   Lactobacillus Itching   Naproxen Sodium     Itching/rash   Pseudoephedrine Rash   Tetracyclines & Related Other (See Comments)    Was taking flagyl at the same time; unsure which caused the swelling  Swelling of throat. Pharmacist states that he believes it was the flagyl and not the tetracycline   Wheat Other (See Comments)    Upset stomach. Now eats gluten free    No orders of the defined types were placed in this encounter.   AUTHORIZATION INFORMATION Primary Insurance: 1D#: Group #:  Secondary Insurance: 1D#: Group #:  SCHEDULE INFORMATION: Date: 08/14/24 Time: Location: [facility]

## 2024-06-05 ENCOUNTER — Ambulatory Visit: Payer: PPO

## 2024-06-05 DIAGNOSIS — I441 Atrioventricular block, second degree: Secondary | ICD-10-CM | POA: Diagnosis not present

## 2024-06-08 LAB — CUP PACEART REMOTE DEVICE CHECK
Battery Impedance: 768 Ohm
Battery Remaining Longevity: 83 mo
Battery Voltage: 2.77 V
Brady Statistic AP VP Percent: 0 %
Brady Statistic AP VS Percent: 7 %
Brady Statistic AS VP Percent: 0 %
Brady Statistic AS VS Percent: 92 %
Date Time Interrogation Session: 20251126191502
Implantable Lead Connection Status: 753985
Implantable Lead Connection Status: 753985
Implantable Lead Implant Date: 20061128
Implantable Lead Implant Date: 20061128
Implantable Lead Location: 753859
Implantable Lead Location: 753860
Implantable Lead Model: 4092
Implantable Lead Model: 4592
Implantable Pulse Generator Implant Date: 20161123
Lead Channel Impedance Value: 657 Ohm
Lead Channel Impedance Value: 726 Ohm
Lead Channel Pacing Threshold Amplitude: 0.625 V
Lead Channel Pacing Threshold Amplitude: 1 V
Lead Channel Pacing Threshold Pulse Width: 0.4 ms
Lead Channel Pacing Threshold Pulse Width: 0.4 ms
Lead Channel Setting Pacing Amplitude: 2 V
Lead Channel Setting Pacing Amplitude: 2.5 V
Lead Channel Setting Pacing Pulse Width: 0.4 ms
Lead Channel Setting Sensing Sensitivity: 2.8 mV
Zone Setting Status: 755011
Zone Setting Status: 755011

## 2024-06-11 ENCOUNTER — Ambulatory Visit: Payer: Self-pay | Admitting: Cardiology

## 2024-06-11 NOTE — Progress Notes (Signed)
 Remote PPM Transmission

## 2024-06-14 DIAGNOSIS — D225 Melanocytic nevi of trunk: Secondary | ICD-10-CM | POA: Diagnosis not present

## 2024-06-14 DIAGNOSIS — Z08 Encounter for follow-up examination after completed treatment for malignant neoplasm: Secondary | ICD-10-CM | POA: Diagnosis not present

## 2024-06-14 DIAGNOSIS — L821 Other seborrheic keratosis: Secondary | ICD-10-CM | POA: Diagnosis not present

## 2024-06-14 DIAGNOSIS — D2271 Melanocytic nevi of right lower limb, including hip: Secondary | ICD-10-CM | POA: Diagnosis not present

## 2024-06-14 DIAGNOSIS — Z85828 Personal history of other malignant neoplasm of skin: Secondary | ICD-10-CM | POA: Diagnosis not present

## 2024-06-14 DIAGNOSIS — D2261 Melanocytic nevi of right upper limb, including shoulder: Secondary | ICD-10-CM | POA: Diagnosis not present

## 2024-06-14 DIAGNOSIS — D2262 Melanocytic nevi of left upper limb, including shoulder: Secondary | ICD-10-CM | POA: Diagnosis not present

## 2024-06-14 DIAGNOSIS — D2272 Melanocytic nevi of left lower limb, including hip: Secondary | ICD-10-CM | POA: Diagnosis not present

## 2024-06-26 ENCOUNTER — Ambulatory Visit: Payer: Self-pay

## 2024-06-26 VITALS — Ht 64.0 in | Wt 148.0 lb

## 2024-06-26 DIAGNOSIS — Z0000 Encounter for general adult medical examination without abnormal findings: Secondary | ICD-10-CM

## 2024-06-26 NOTE — Patient Instructions (Signed)
 Wendy Kelly,  Thank you for taking the time for your Medicare Wellness Visit. I appreciate your continued commitment to your health goals. Please review the care plan we discussed, and feel free to reach out if I can assist you further.  Please note that Annual Wellness Visits do not include a physical exam. Some assessments may be limited, especially if the visit was conducted virtually. If needed, we may recommend an in-person follow-up with your provider.  Ongoing Care Seeing your primary care provider every 3 to 6 months helps us  monitor your health and provide consistent, personalized care.   Referrals If a referral was made during today's visit and you haven't received any updates within two weeks, please contact the referred provider directly to check on the status.  Recommended Screenings:  Please call to schedule your mammogram (due after 07/21/24):  [facility] Address: [address], [place], [place] Phone: [phone]  [facility] [address], [address] [place], [place] [address] Phone: [phone]   Health Maintenance  Topic Date Due   Medicare Annual Wellness Visit  06/20/2024   Colon Cancer Screening  06/25/2024   Breast Cancer Screening  07/21/2024   Zoster (Shingles) Vaccine (1 of 2) 08/11/2024*   Flu Shot  10/09/2024*   Pneumococcal Vaccine for age over 16 (1 of 1 - PCV) 05/11/2025*   DTaP/Tdap/Td vaccine (3 - Tdap) 04/13/2026   Osteoporosis screening with Bone Density Scan  06/03/2026   Hepatitis C Screening  Completed   Meningitis B Vaccine  Aged Out   COVID-19 Vaccine  Discontinued  *Topic was postponed. The date shown is not the original due date.       06/26/2024   10:05 AM  Advanced Directives  Does Patient Have a Medical Advance Directive? Yes  Type of Estate Agent of [place];Living will  Does patient want to make changes to medical advance  directive? No - Patient declined  Copy of Healthcare Power of Attorney in Chart? No - copy requested    Vision: Annual vision screenings are recommended for early detection of glaucoma, cataracts, and diabetic retinopathy. These exams can also reveal signs of chronic conditions such as diabetes and high blood pressure.  Dental: Annual dental screenings help detect early signs of oral cancer, gum disease, and other conditions linked to overall health, including heart disease and diabetes.  Please see the attached documents for additional preventive care recommendations.

## 2024-06-26 NOTE — Progress Notes (Signed)
 Chief Complaint  Patient presents with   Medicare Wellness     Subjective:   Wendy Kelly is a 68 y.o. female who presents for a Medicare Annual Wellness Visit.  Visit info / Clinical Intake: Medicare Wellness Visit Type:: Subsequent Annual Wellness Visit Persons participating in visit and providing information:: patient Medicare Wellness Visit Mode:: Video Since this visit was completed virtually, some vitals may be partially provided or unavailable. Missing vitals are due to the limitations of the virtual format.: Documented vitals are patient reported If Telephone or Video please confirm:: I connected with patient using audio/video enable telemedicine. I verified patient identity with two identifiers, discussed telehealth limitations, and patient agreed to proceed. Patient Location:: work Dispensing Optician:: clinic office Interpreter Needed?: No Pre-visit prep was completed: yes AWV questionnaire completed by patient prior to visit?: no Living arrangements:: lives with spouse/significant other Patient's Overall Health Status Rating: very good Typical amount of pain: some Does pain affect daily life?: no Are you currently prescribed opioids?: no  Dietary Habits and Nutritional Risks How many meals a day?: 3 Eats fruit and vegetables daily?: (!) no (no fruits) Most meals are obtained by: preparing own meals; eating out (50/50) In the last 2 weeks, have you had any of the following?: none Diabetic:: no  Functional Status Activities of Daily Living (to include ambulation/medication): Independent Ambulation: Independent with device- listed below Home Assistive Devices/Equipment: Eyeglasses Medication Administration: Independent Home Management (perform basic housework or laundry): Independent Manage your own finances?: yes Primary transportation is: driving Concerns about vision?: no *vision screening is required for WTM* Concerns about hearing?: no  Fall  Screening Falls in the past year?: 0 Number of falls in past year: 0 Was there an injury with Fall?: 0 Fall Risk Category Calculator: 0 Patient Fall Risk Level: Low Fall Risk  Fall Risk Patient at Risk for Falls Due to: No Fall Risks Fall risk Follow up: Falls evaluation completed  Home and Transportation Safety: All rugs have non-skid backing?: N/A, no rugs All stairs or steps have railings?: yes (also has a ramp) Grab bars in the bathtub or shower?: yes Have non-skid surface in bathtub or shower?: yes Good home lighting?: yes Regular seat belt use?: yes Hospital stays in the last year:: no  Cognitive Assessment Difficulty concentrating, remembering, or making decisions? : no Will 6CIT or Mini Cog be Completed: yes What year is it?: 0 points What month is it?: 0 points Give patient an address phrase to remember (5 components): [address] [place] About what time is it?: 0 points Count backwards from 20 to 1: 0 points Say the months of the year in reverse: 0 points Repeat the address phrase from earlier: 0 points 6 CIT Score: 0 points  Advance Directives (For Healthcare) Does Patient Have a Medical Advance Directive?: Yes Does patient want to make changes to medical advance directive?: No - Patient declined Type of Advance Directive: Healthcare Power of [place]; Living will Copy of Healthcare Power of Attorney in Chart?: No - copy requested Copy of Living Will in Chart?: No - copy requested  Reviewed/Updated  Reviewed/Updated: Reviewed All (Medical, Surgical, Family, Medications, Allergies, Care Teams, Patient Goals)    Allergies (verified) Cefdinir, Cephalosporins, Dairy aid [tilactase], Flagyl [metronidazole], Lactose intolerance (gi), Penicillins, Phenylephrine -guaifenesin, Sulfa antibiotics, Sulfonamide derivatives, Tape, Dexilant  [dexlansoprazole ], Entex lq [phenylephrine -guaifenesin], Other, Oxycodone, Succinylcholine, Vancomycin , Clarithromycin , Haemophilus  influenzae vaccines, Quinolones, Tetracycline, Codeine, Guaifenesin & derivatives, Lactobacillus, Naproxen sodium, Pseudoephedrine, Tetracyclines & related, and Wheat   Current Medications (verified)  Outpatient Encounter Medications as of 06/26/2024  Medication Sig   azelastine (ASTELIN) 0.1 % nasal spray SMARTSIG:1-2 Spray(s) Both Nares Every 12 Hours PRN   b complex vitamins tablet Take 2 tablets by mouth daily.   Calcium  Carb-Cholecalciferol (CALCIUM  500 + D3 PO) Take 2 Doses by mouth daily.    CRANBERRY SOFT PO Take 2 each by mouth daily.    Digestive Enzymes (DIGESTIVE ENZYME PO) Take 1-2 capsules by mouth daily.   liothyronine (CYTOMEL) 5 MCG tablet Take 5 mcg by mouth daily.   NON FORMULARY Essential Oils   Prasterone, DHEA, (DHEA PO) Take 5 mg by mouth 2 (two) times daily.   PRESCRIPTION MEDICATION Place 1 tablet vaginally 3 (three) times a week. E3 1mg  and Testosterone 1mg  compound   progesterone  (PROMETRIUM ) 100 MG capsule Take 100 mg by mouth at bedtime.   sodium chloride  (MURO 128) 5 % ophthalmic solution Place 1 drop into both eyes daily.   thyroid  (ARMOUR THYROID ) 90 MG tablet Take 1 tablet (90 mg total) by mouth daily. TAKING 90mg    Facility-Administered Encounter Medications as of 06/26/2024  Medication   0.9 %  sodium chloride  infusion    History: Past Medical History:  Diagnosis Date   AA (aortic aneurysm)    a. 04/2011 Echo: Ao Root: 4.1cm, Asc Ao 4.7cm.   Anxiety    AV block, 2nd degree    a. 05/2005 - s/p MDT Adapta ADDR01 Dual Chamber PPM   Bicuspid aortic valve    Cancer (HCC)    Throat   Chest pain    a. 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   COVID-19 virus infection 09/11/2019   Deep vein thrombosis (DVT) of left lower extremity (HCC) 10/03/2017   Dehydration 08/11/2017   Depression    Expressive aphasia    a. ongoing since 04/2011 - seen by neurology - ? TIA vs. Migraine   Facial numbness    a. ongoing since 04/2011 - seen by neurology - ? TIA vs. Migraine   GERD  (gastroesophageal reflux disease)    Low blood pressure    Personal history of chemotherapy 2018-2019   Throat   Personal history of radiation therapy 2018-2019   Throat   Presence of permanent cardiac pacemaker    Shingles    pt had on right side of head and his neck   Squamous cell carcinoma 05/2017   lymph node right side of neck   Syncope    a. 04/2011 Echo: EF 55-65%, No RWMA, Gr 1 DD.   Thyroid  disease    Past Surgical History:  Procedure Laterality Date   ABDOMINAL WALL MESH  REMOVAL     bladder tack     cataract Bilateral 2004   CHOLECYSTECTOMY  2000   COLONOSCOPY WITH PROPOFOL  N/A 06/26/2019   Procedure: COLONOSCOPY WITH PROPOFOL ;  Surgeon: Jinny Carmine, MD;  Location: [facility] ENDOSCOPY;  Service: Endoscopy;  Laterality: N/A;   DILATION AND CURETTAGE OF UTERUS     DIRECT LARYNGOSCOPY Right 06/16/2017   Procedure: MICRO DIRECT LARYNGOSCOPY WITH BIOPSY OF RIGHT BASE OF TONGUE;  Surgeon: Milissa Hamming, MD;  Location: [facility] ORS;  Service: ENT;  Laterality: Right;   EP IMPLANTABLE DEVICE N/A 06/04/2015   Procedure: PPM Generator Changeout;  Surgeon: Elspeth JAYSON Sage, MD;  [facility];  Service: Cardiovascular;  Laterality: N/A;   ESOPHAGOGASTRODUODENOSCOPY (EGD) WITH PROPOFOL  N/A 12/18/2015   Procedure: ESOPHAGOGASTRODUODENOSCOPY (EGD) WITH gastric biopsy and dilation;  Surgeon: Carmine Jinny, MD;  Location: [facility];  Service:  Endoscopy;  Laterality: N/A;   EYE SURGERY     INSERT / REPLACE / REMOVE PACEMAKER     IR FLUORO GUIDE PORT INSERTION RIGHT  06/22/2017   IR REMOVAL TUN ACCESS W/ PORT W/O FL MOD SED  04/14/2018   MOHS SURGERY  02/2018   PACEMAKER INSERTION  2006   Medtronic Adapta ADDR01   TONSILLECTOMY     TONSILLECTOMY     VAGINAL HYSTERECTOMY  2013   mad/cope   Family History  Problem Relation Age of Onset   COPD Mother        alive @ 63   Atrial fibrillation Mother    Stroke Father        died @ 75   Breast cancer Maternal Aunt 16    COPD Maternal Grandmother    Ovarian cancer Neg Hx    Diabetes Neg Hx    Social History   Occupational History   Occupation: massage therapist  Tobacco Use   Smoking status: Never   Smokeless tobacco: Never   Tobacco comments:    tobacco use -no  Vaping Use   Vaping status: Never Used  Substance and Sexual Activity   Alcohol use: No   Drug use: No   Sexual activity: Yes    Birth control/protection: Surgical   Tobacco Counseling Counseling given: Not Answered Tobacco comments: tobacco use -no  SDOH Screenings   Food Insecurity: No Food Insecurity (06/26/2024)  Housing: Low Risk (06/26/2024)  Transportation Needs: No Transportation Needs (06/26/2024)  Utilities: Not At Risk (06/26/2024)  Alcohol Screen: Low Risk (05/11/2024)  Depression (PHQ2-9): Low Risk (06/26/2024)  Financial Resource Strain: Low Risk (05/11/2024)  Physical Activity: Insufficiently Active (06/26/2024)  Social Connections: Socially Integrated (06/26/2024)  Stress: Stress Concern Present (06/26/2024)  Tobacco Use: Low Risk (06/26/2024)  Health Literacy: Adequate Health Literacy (06/26/2024)   See flowsheets for full screening details  Depression Screen PHQ 2 & 9 Depression Scale- Over the past 2 weeks, how often have you been bothered by any of the following problems? Little interest or pleasure in doing things: 0 Feeling down, depressed, or hopeless (PHQ Adolescent also includes...irritable): 0 PHQ-2 Total Score: 0 Trouble falling or staying asleep, or sleeping too much: 0 Feeling tired or having little energy: 0 Poor appetite or overeating (PHQ Adolescent also includes...weight loss): 0 Feeling bad about yourself - or that you are a failure or have let yourself or your family down: 0 Trouble concentrating on things, such as reading the newspaper or watching television (PHQ Adolescent also includes...like school work): 0 Moving or speaking so slowly that other people could have noticed. Or the  opposite - being so fidgety or restless that you have been moving around a lot more than usual: 0 Thoughts that you would be better off dead, or of hurting yourself in some way: 0 PHQ-9 Total Score: 0 If you checked off any problems, how difficult have these problems made it for you to do your work, take care of things at home, or get along with other people?: Not difficult at all  Depression Treatment Depression Interventions/Treatment : EYV7-0 Score <4 Follow-up Not Indicated     Goals Addressed               This Visit's Progress     Exercise 4-5x per week (30 min per time) (pt-stated)   Not on track            Objective:    Today's Vitals   06/26/24 0959  Weight:  148 lb (67.1 kg)  Height: 5' 4 (1.626 m)   Body mass index is 25.4 kg/m.  Hearing/Vision screen Hearing Screening - Comments:: Denies hearing loss  Vision Screening - Comments:: UTD w/ Dr. Dingledein Dryden Maxwell Immunizations and Health Maintenance Health Maintenance  Topic Date Due   Colonoscopy  06/25/2024   Mammogram  07/21/2024   Zoster Vaccines- Shingrix (1 of 2) 08/11/2024 (Originally 01/17/1975)   Influenza Vaccine  10/09/2024 (Originally 02/10/2024)   Pneumococcal Vaccine: 50+ Years (1 of 1 - PCV) 05/11/2025 (Originally 01/17/2005)   Medicare Annual Wellness (AWV)  06/26/2025   DTaP/Tdap/Td (3 - Tdap) 04/13/2026   Bone Density Scan  06/03/2026   Hepatitis C Screening  Completed   Meningococcal B Vaccine  Aged Out   COVID-19 Vaccine  Discontinued        Assessment/Plan:  This is a routine wellness examination for Kayci.  Patient Care Team: Vicci Duwaine SQUIBB, DO as PCP - General (Family Medicine) Perla Evalene PARAS, MD as Consulting Physician (Cardiology) Lucas Dorise POUR, MD as Consulting Physician (Cardiothoracic Surgery) Lenn Standing, MD (Ophthalmology) Kennyth Chew, MD as Consulting Physician (Cardiology) Milissa Hamming, MD as Referring Physician (Otolaryngology) Trudy Dorn BRAVO, MD as Referring Physician (Family Medicine) Dasher, Alm LABOR, MD (Dermatology)  I have personally reviewed and noted the following in the patients chart:   Medical and social history Use of alcohol, tobacco or illicit drugs  Current medications and supplements including opioid prescriptions. Functional ability and status Nutritional status Physical activity Advanced directives List of other physicians Hospitalizations, surgeries, and ER visits in previous 12 months Vitals Screenings to include cognitive, depression, and falls Referrals and appointments  No orders of the defined types were placed in this encounter.  In addition, I have reviewed and discussed with patient certain preventive protocols, quality metrics, and best practice recommendations. A written personalized care plan for preventive services as well as general preventive health recommendations were provided to patient.   Vina Ned, CMA   06/26/2024   No follow-ups on file.  After Visit Summary: (MyChart) Due to this being a telephonic visit, the after visit summary with patients personalized plan was offered to patient via MyChart   Nurse Notes:  Declined flu, pneumonia, shingles and covid vaccines Gave ph# to schedule [facility] (due ~07/21/24) Colonoscopy scheduled for 08/14/24 Patient declined to have future AWVs

## 2024-08-06 ENCOUNTER — Encounter

## 2024-08-08 ENCOUNTER — Telehealth: Payer: Self-pay

## 2024-08-08 NOTE — Telephone Encounter (Signed)
 The patient called to reschedule her colonoscopy. She reported that her mother passed away on the 25-Sep-2024 and expressed concern about proceeding with the procedure due to recent elevations in her blood pressure, even with minimal stress. The patient stated she would like to wait until she receives guidance from her cardiologist before rescheduling.

## 2024-08-08 NOTE — Telephone Encounter (Signed)
 Thank you for telephone message.  Patients call has been returned.  Condolence expressed to her regarding the loss of her mother.  Colonoscopy has been rescheduled to 09/24/24 still at [facility] with Dr. Jinny.  Secure message has been routed to [employer] , [place] to move procedure date.  Olam has been notified at our [facility] office to make her aware for insurance purposes.  Instructions updated to reflect her new procedure date.  [name], CMA

## 2024-08-16 ENCOUNTER — Ambulatory Visit
Admission: RE | Admit: 2024-08-16 | Discharge: 2024-08-16 | Disposition: A | Source: Ambulatory Visit | Attending: Family Medicine

## 2024-08-16 DIAGNOSIS — Z1231 Encounter for screening mammogram for malignant neoplasm of breast: Secondary | ICD-10-CM

## 2024-09-04 ENCOUNTER — Ambulatory Visit: Payer: PPO

## 2024-09-24 ENCOUNTER — Ambulatory Visit: Admission: RE | Admit: 2024-09-24 | Source: Home / Self Care | Admitting: Gastroenterology

## 2024-09-24 ENCOUNTER — Encounter: Admission: RE | Payer: Self-pay | Source: Home / Self Care

## 2024-12-04 ENCOUNTER — Ambulatory Visit: Payer: PPO

## 2025-03-05 ENCOUNTER — Ambulatory Visit: Payer: PPO

## 2025-05-14 ENCOUNTER — Encounter: Admitting: Family Medicine
